# Patient Record
Sex: Female | Born: 1950 | Race: White | Hispanic: No | Marital: Married | Smoking: Never smoker
Health system: Southern US, Community
[De-identification: ages and names within clinical notes are randomized; demographics above are authoritative.]

## PROBLEM LIST (undated history)

## (undated) DIAGNOSIS — G4730 Sleep apnea, unspecified: Secondary | ICD-10-CM

## (undated) DIAGNOSIS — S060XAA Concussion with loss of consciousness status unknown, initial encounter: Secondary | ICD-10-CM

## (undated) DIAGNOSIS — I209 Angina pectoris, unspecified: Secondary | ICD-10-CM

## (undated) DIAGNOSIS — F988 Other specified behavioral and emotional disorders with onset usually occurring in childhood and adolescence: Secondary | ICD-10-CM

## (undated) DIAGNOSIS — M1990 Unspecified osteoarthritis, unspecified site: Secondary | ICD-10-CM

## (undated) DIAGNOSIS — E039 Hypothyroidism, unspecified: Secondary | ICD-10-CM

## (undated) DIAGNOSIS — F419 Anxiety disorder, unspecified: Secondary | ICD-10-CM

## (undated) DIAGNOSIS — F32A Depression, unspecified: Secondary | ICD-10-CM

## (undated) DIAGNOSIS — R42 Dizziness and giddiness: Secondary | ICD-10-CM

## (undated) DIAGNOSIS — K589 Irritable bowel syndrome without diarrhea: Secondary | ICD-10-CM

## (undated) DIAGNOSIS — E119 Type 2 diabetes mellitus without complications: Secondary | ICD-10-CM

## (undated) DIAGNOSIS — K219 Gastro-esophageal reflux disease without esophagitis: Secondary | ICD-10-CM

## (undated) DIAGNOSIS — R2689 Other abnormalities of gait and mobility: Secondary | ICD-10-CM

## (undated) DIAGNOSIS — E785 Hyperlipidemia, unspecified: Secondary | ICD-10-CM

## (undated) DIAGNOSIS — Z973 Presence of spectacles and contact lenses: Secondary | ICD-10-CM

## (undated) DIAGNOSIS — D649 Anemia, unspecified: Secondary | ICD-10-CM

## (undated) DIAGNOSIS — R51 Headache: Secondary | ICD-10-CM

## (undated) DIAGNOSIS — R200 Anesthesia of skin: Secondary | ICD-10-CM

## (undated) DIAGNOSIS — W19XXXA Unspecified fall, initial encounter: Secondary | ICD-10-CM

## (undated) DIAGNOSIS — J189 Pneumonia, unspecified organism: Secondary | ICD-10-CM

## (undated) DIAGNOSIS — R519 Headache, unspecified: Secondary | ICD-10-CM

## (undated) DIAGNOSIS — F329 Major depressive disorder, single episode, unspecified: Secondary | ICD-10-CM

## (undated) DIAGNOSIS — I10 Essential (primary) hypertension: Secondary | ICD-10-CM

## (undated) DIAGNOSIS — S060X9A Concussion with loss of consciousness of unspecified duration, initial encounter: Secondary | ICD-10-CM

## (undated) DIAGNOSIS — Z87440 Personal history of urinary (tract) infections: Secondary | ICD-10-CM

## (undated) HISTORY — PX: CARPAL TUNNEL RELEASE: SHX101

## (undated) HISTORY — PX: UVULOPALATOPHARYNGOPLASTY: SHX827

## (undated) HISTORY — PX: TONSILLECTOMY: SUR1361

## (undated) HISTORY — PX: KNEE ARTHROSCOPY: SUR90

## (undated) HISTORY — PX: TUBAL LIGATION: SHX77

## (undated) HISTORY — PX: BREAST LUMPECTOMY: SHX2

## (undated) HISTORY — PX: EYE SURGERY: SHX253

## (undated) HISTORY — PX: BREAST CYST EXCISION: SHX579

## (undated) HISTORY — PX: DE QUERVAIN'S RELEASE: SHX1439

## (undated) HISTORY — PX: BREAST CYST ASPIRATION: SHX578

## (undated) HISTORY — PX: BREAST SURGERY: SHX581

## (undated) HISTORY — PX: HERNIA REPAIR: SHX51

## (undated) HISTORY — PX: RHINOPLASTY: SUR1284

---

## 1978-02-26 HISTORY — PX: REDUCTION MAMMAPLASTY: SUR839

## 1998-03-02 ENCOUNTER — Ambulatory Visit (HOSPITAL_COMMUNITY): Admission: RE | Admit: 1998-03-02 | Discharge: 1998-03-02 | Payer: Self-pay | Admitting: Obstetrics and Gynecology

## 1998-03-02 ENCOUNTER — Encounter: Payer: Self-pay | Admitting: Obstetrics and Gynecology

## 2005-01-01 ENCOUNTER — Ambulatory Visit: Payer: Self-pay | Admitting: Unknown Physician Specialty

## 2006-01-14 ENCOUNTER — Ambulatory Visit: Payer: Self-pay

## 2006-02-14 ENCOUNTER — Ambulatory Visit: Payer: Self-pay | Admitting: Unknown Physician Specialty

## 2006-06-28 ENCOUNTER — Ambulatory Visit: Payer: Self-pay

## 2007-04-15 ENCOUNTER — Ambulatory Visit: Payer: Self-pay

## 2007-06-12 ENCOUNTER — Ambulatory Visit (HOSPITAL_BASED_OUTPATIENT_CLINIC_OR_DEPARTMENT_OTHER): Admission: RE | Admit: 2007-06-12 | Discharge: 2007-06-12 | Payer: Self-pay | Admitting: Orthopedic Surgery

## 2007-08-04 ENCOUNTER — Ambulatory Visit: Payer: Self-pay

## 2007-08-05 ENCOUNTER — Ambulatory Visit: Payer: Self-pay | Admitting: Unknown Physician Specialty

## 2009-01-03 ENCOUNTER — Ambulatory Visit: Payer: Self-pay | Admitting: Obstetrics and Gynecology

## 2009-01-10 ENCOUNTER — Ambulatory Visit: Payer: Self-pay | Admitting: Obstetrics and Gynecology

## 2010-03-20 ENCOUNTER — Encounter: Payer: Self-pay | Admitting: Internal Medicine

## 2010-07-11 NOTE — Op Note (Signed)
NAME:  Destiny Shaw, Destiny Shaw                  ACCOUNT NO.:  [id number]   MEDICAL RECORD NO.:  [id number]          PATIENT TYPE:  AMB   LOCATION:  [facility]                         FACILITY:  [facility]   PHYSICIAN:  Madlyn Frankel. Charlann Boxer, M.D.  DATE OF BIRTH:  1950-08-30   DATE OF PROCEDURE:  06/12/2007  DATE OF DISCHARGE:                               OPERATIVE REPORT   PREOPERATIVE DIAGNOSIS:  Persistent left knee pain medially with concern  for medial meniscal tear versus osteoarthritic changes.   POSTOPERATIVE DIAGNOSES/FINDINGS:  1. Left knee undersurface medial meniscal tear of posterior horn.  2. Central degenerative changes to the lateral meniscus, midbody      anterior horn.  3. 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  4. Grade 2 to 3 changes of the patellofemoral apex.  5. Synovitis.   OPERATION PERFORMED:  1. Left knee diagnostic and operative arthroscopy with medial      meniscectomy.  2. Partial lateral meniscectomy.  3. Medial and patellofemoral chondroplasty.  4. Synovectomy.   SURGEON:  Madlyn Frankel. Charlann Boxer, M.D.   ASSISTANT:  None.   ANESTHESIA:  Local administered anesthesia plus MAC.   COMPLICATIONS:  None.   DRAINS:  None.   INDICATIONS FOR PROCEDURE:  Ms. Shellenbarger is a 60 year old female who I have  been following for her left knee for some time after she had initial  arthroscopic surgery performed in 2007.  She had persistent problems.  Initial injury was more of a direct blow contusive type injury with  mechanical type symptoms that have persisted.  After extensive  discussion, the plan was to proceed with arthroscopic surgery and to  plan for debridement, evaluation of the knee for potential long term  sequelae addressing these issues from that standpoint.  The risks and  benefits were discussed, particularly including the risks of infection,  deep venous  thrombosis, persistent discomfort postoperatively and  potential need for another surgical procedure down the road.  Consent  was obtained.   DESCRIPTION OF PROCEDURE:  The patient was brought to the operating  theater.  Once adequate anesthesia, preoperative antibiotics  administered, the patient was positioned supine with the left leg in the  leg holder.  The left lower extremity was then prescrubbed and prepped  and draped in sterile fashion.  Standard inferomedial, superomedial,  inferolateral portals were utilized.  Diagnostic evaluation of the knee  revealed the above findings.  There was noted to be fairly extensive  synovitis versus scarring on the medial side of the knee.  Patellofemoral changes were noted to be predominantly at the  patellofemoral apex.  The trochlear surface appeared to be intact.  This  was better defined after synovectomy.  The lateral compartment was  relatively intact both femoral and tibial cartilage surfaces.  The  meniscus had some evidence of some degenerative fraying and tearing into  the central portion, the midbody and lateral meniscus.  This was  debrided back using the  3.5 Cuda shaver and small biting basket.  The  remainder of this meniscus which was approximately 90% was stable and  normal.   Medially which most of the attention was addressed including synovectomy  with a 3.5 Cuda shaver.  Prior examination of the meniscus revealed  abnormal meniscus with tear on the inferior portion of the posterior  horn.  I used the biting basket  in further delineating this.  I used  the biting basket to trim out the inferior leaflet and then used the 35  shaver to remove the fragments as well as contour the remaining  meniscus.  A 35 shaver was also used to debride some of the chondral  flaps hear this grade 4 area on the medial tibial plateau and medial  femoral condyle.   Following all these procedures, I re-examined the knee to make sure  there was  no loose fragments of cartilage.  Once I was satisfied there  were none, the instrumentation was removed.  The portals were  reapproximated using a 4-0 nylon.   Please note that I chose not to perform microfracture technique in this  60 year old female due to kissing lesions.  This was not an isolated  lesion and I did not feel the success of that operation would be worthy  of that.   I will address these findings with the patient in the postoperative  period.   Following the closure of the wounds, the knee was injected with 30 mL of  0.25% Marcaine with epinephrine.  The knee was then dressed into a  sterile, bulky Jones dressing.  She was brought to the recovery room in  stable condition tolerating the procedure well.      Madlyn Frankel Charlann Boxer, M.D.  Electronically Signed     MDO/MEDQ  D:  06/12/2007  T:  06/12/2007  Job:  [id number]

## 2010-09-08 ENCOUNTER — Ambulatory Visit: Payer: Self-pay | Admitting: Otolaryngology

## 2010-10-06 ENCOUNTER — Ambulatory Visit: Payer: Self-pay | Admitting: Otolaryngology

## 2010-11-21 LAB — POCT HEMOGLOBIN-HEMACUE
Hemoglobin: 13.5
Operator id: 268271

## 2011-10-15 ENCOUNTER — Ambulatory Visit: Payer: Self-pay | Admitting: Anesthesiology

## 2011-10-24 ENCOUNTER — Ambulatory Visit: Payer: Self-pay | Admitting: Internal Medicine

## 2011-10-30 ENCOUNTER — Ambulatory Visit: Payer: Self-pay | Admitting: Orthopedic Surgery

## 2013-06-08 ENCOUNTER — Ambulatory Visit: Payer: Self-pay | Admitting: Orthopedic Surgery

## 2013-09-07 ENCOUNTER — Ambulatory Visit: Payer: Self-pay

## 2013-11-09 ENCOUNTER — Emergency Department: Payer: Self-pay | Admitting: Emergency Medicine

## 2014-03-26 ENCOUNTER — Ambulatory Visit: Payer: Self-pay | Admitting: Obstetrics and Gynecology

## 2014-06-15 NOTE — Op Note (Signed)
PATIENT NAME:  Destiny Shaw, Destiny Shaw MR#:  [id number] DATE OF BIRTH:  [birth year]  DATE OF PROCEDURE:  10/30/2011  PREOPERATIVE DIAGNOSIS: Right wrist de Quervain's syndrome.  POSTOPERATIVE DIAGNOSIS: Right wrist de Quervain's syndrome.  PROCEDURE: Right wrist de Quervain's release.   SURGEON: Laurene Footman, M.D.   ANESTHESIA: General.   DESCRIPTION OF PROCEDURE: The patient was brought to the Operating Room and after adequate general anesthesia was obtained a tourniquet was applied to the right upper arm. After prepping and draping in the usual sterile fashion, appropriate patient identification and time out procedures were carried out. The tourniquet was raised to 250 mmHg. An approximately 1.5 cm incision was made over the radial styloid and in a skin crease. The subcutaneous tissue was spread preserving cutaneous nerves and retractors placed. The area of compression was identified and the fascia incised. There was a moderate amount of fluid within the canal. There was a separate compartment for the extensor tendon. Both tendons were released thoroughly and there were no residual bands visible. The thumb was placed through a range of motion. There did not appear to be any further constriction on the tendons; the tendons themselves were intact. The wound was then irrigated and closed with simple interrupted 4-0 nylon. 5 mL of 0.5% Sensorcaine without epinephrine was infiltrated for postoperative analgesia. A sterile dressing of Xeroform, 4 x 4's, Webril, and Ace wrap was applied. The tourniquet was let down. The patient was sent to the recovery room in stable condition.   ESTIMATED BLOOD LOSS: Minimal.   COMPLICATIONS: None.        SPECIMEN: None.   TOURNIQUET TIME: 11 minutes at 250 mmHg. ____________________________ Laurene Footman, MD mjm:slb D: 10/30/2011 13:59:39 ET T: 10/30/2011 14:14:13 ET JOB#: [id number]  cc: Laurene Footman, MD, <Dictator> Laurene Footman MD ELECTRONICALLY SIGNED  10/31/2011 8:00

## 2014-08-04 NOTE — Progress Notes (Signed)
Notified pt of results 

## 2015-04-04 ENCOUNTER — Other Ambulatory Visit: Payer: Self-pay | Admitting: Orthopedic Surgery

## 2015-04-04 DIAGNOSIS — M5126 Other intervertebral disc displacement, lumbar region: Secondary | ICD-10-CM

## 2015-04-25 ENCOUNTER — Ambulatory Visit
Admission: RE | Admit: 2015-04-25 | Discharge: 2015-04-25 | Disposition: A | Discharge status: Home / Self Care | Payer: Medicare Other | Source: Ambulatory Visit | Attending: Orthopedic Surgery | Admitting: Orthopedic Surgery

## 2015-04-25 DIAGNOSIS — M4690 Unspecified inflammatory spondylopathy, site unspecified: Secondary | ICD-10-CM | POA: Insufficient documentation

## 2015-04-25 DIAGNOSIS — M5127 Other intervertebral disc displacement, lumbosacral region: Secondary | ICD-10-CM | POA: Diagnosis not present

## 2015-04-25 DIAGNOSIS — M5126 Other intervertebral disc displacement, lumbar region: Secondary | ICD-10-CM | POA: Diagnosis not present

## 2015-04-25 DIAGNOSIS — M4806 Spinal stenosis, lumbar region: Secondary | ICD-10-CM | POA: Insufficient documentation

## 2015-05-10 ENCOUNTER — Encounter: Payer: Self-pay | Admitting: *Deleted

## 2015-05-11 ENCOUNTER — Encounter: Payer: Self-pay | Admitting: *Deleted

## 2015-05-11 ENCOUNTER — Encounter
Admission: RE | Disposition: A | Discharge status: Home / Self Care | Payer: Self-pay | Source: Ambulatory Visit | Attending: Unknown Physician Specialty

## 2015-05-11 ENCOUNTER — Ambulatory Visit: Payer: Medicare Other | Admitting: Anesthesiology

## 2015-05-11 ENCOUNTER — Ambulatory Visit
Admission: RE | Admit: 2015-05-11 | Discharge: 2015-05-11 | Disposition: A | Discharge status: Home / Self Care | Payer: Medicare Other | Source: Ambulatory Visit | Attending: Unknown Physician Specialty | Admitting: Unknown Physician Specialty

## 2015-05-11 DIAGNOSIS — K5900 Constipation, unspecified: Secondary | ICD-10-CM | POA: Diagnosis not present

## 2015-05-11 DIAGNOSIS — F988 Other specified behavioral and emotional disorders with onset usually occurring in childhood and adolescence: Secondary | ICD-10-CM | POA: Insufficient documentation

## 2015-05-11 DIAGNOSIS — Z7984 Long term (current) use of oral hypoglycemic drugs: Secondary | ICD-10-CM | POA: Insufficient documentation

## 2015-05-11 DIAGNOSIS — G4730 Sleep apnea, unspecified: Secondary | ICD-10-CM | POA: Diagnosis not present

## 2015-05-11 DIAGNOSIS — R195 Other fecal abnormalities: Secondary | ICD-10-CM | POA: Insufficient documentation

## 2015-05-11 DIAGNOSIS — D123 Benign neoplasm of transverse colon: Secondary | ICD-10-CM | POA: Insufficient documentation

## 2015-05-11 DIAGNOSIS — E785 Hyperlipidemia, unspecified: Secondary | ICD-10-CM | POA: Diagnosis not present

## 2015-05-11 DIAGNOSIS — K589 Irritable bowel syndrome without diarrhea: Secondary | ICD-10-CM | POA: Insufficient documentation

## 2015-05-11 DIAGNOSIS — E119 Type 2 diabetes mellitus without complications: Secondary | ICD-10-CM | POA: Insufficient documentation

## 2015-05-11 DIAGNOSIS — K319 Disease of stomach and duodenum, unspecified: Secondary | ICD-10-CM | POA: Insufficient documentation

## 2015-05-11 DIAGNOSIS — Z79899 Other long term (current) drug therapy: Secondary | ICD-10-CM | POA: Insufficient documentation

## 2015-05-11 DIAGNOSIS — D122 Benign neoplasm of ascending colon: Secondary | ICD-10-CM | POA: Diagnosis not present

## 2015-05-11 DIAGNOSIS — F419 Anxiety disorder, unspecified: Secondary | ICD-10-CM | POA: Insufficient documentation

## 2015-05-11 DIAGNOSIS — K644 Residual hemorrhoidal skin tags: Secondary | ICD-10-CM | POA: Insufficient documentation

## 2015-05-11 DIAGNOSIS — K210 Gastro-esophageal reflux disease with esophagitis: Secondary | ICD-10-CM | POA: Diagnosis not present

## 2015-05-11 DIAGNOSIS — K640 First degree hemorrhoids: Secondary | ICD-10-CM | POA: Insufficient documentation

## 2015-05-11 DIAGNOSIS — F329 Major depressive disorder, single episode, unspecified: Secondary | ICD-10-CM | POA: Diagnosis not present

## 2015-05-11 DIAGNOSIS — E039 Hypothyroidism, unspecified: Secondary | ICD-10-CM | POA: Diagnosis not present

## 2015-05-11 DIAGNOSIS — I10 Essential (primary) hypertension: Secondary | ICD-10-CM | POA: Diagnosis not present

## 2015-05-11 HISTORY — DX: Type 2 diabetes mellitus without complications: E11.9

## 2015-05-11 HISTORY — DX: Major depressive disorder, single episode, unspecified: F32.9

## 2015-05-11 HISTORY — DX: Gastro-esophageal reflux disease without esophagitis: K21.9

## 2015-05-11 HISTORY — DX: Depression, unspecified: F32.A

## 2015-05-11 HISTORY — DX: Anxiety disorder, unspecified: F41.9

## 2015-05-11 HISTORY — DX: Essential (primary) hypertension: I10

## 2015-05-11 HISTORY — PX: COLONOSCOPY WITH PROPOFOL: SHX5780

## 2015-05-11 HISTORY — DX: Hypothyroidism, unspecified: E03.9

## 2015-05-11 HISTORY — DX: Anemia, unspecified: D64.9

## 2015-05-11 HISTORY — DX: Hyperlipidemia, unspecified: E78.5

## 2015-05-11 HISTORY — DX: Other specified behavioral and emotional disorders with onset usually occurring in childhood and adolescence: F98.8

## 2015-05-11 HISTORY — DX: Concussion with loss of consciousness status unknown, initial encounter: S06.0XAA

## 2015-05-11 HISTORY — DX: Sleep apnea, unspecified: G47.30

## 2015-05-11 HISTORY — DX: Irritable bowel syndrome, unspecified: K58.9

## 2015-05-11 HISTORY — DX: Concussion with loss of consciousness of unspecified duration, initial encounter: S06.0X9A

## 2015-05-11 HISTORY — PX: ESOPHAGOGASTRODUODENOSCOPY (EGD) WITH PROPOFOL: SHX5813

## 2015-05-11 LAB — GLUCOSE, CAPILLARY: Glucose-Capillary: 152 mg/dL — ABNORMAL HIGH (ref 65–99)

## 2015-05-11 SURGERY — COLONOSCOPY WITH PROPOFOL
Anesthesia: General

## 2015-05-11 MED ORDER — MIDAZOLAM HCL 2 MG/2ML IJ SOLN
INTRAMUSCULAR | Status: DC | PRN
  Administered 2015-05-11: 1 mg via INTRAVENOUS

## 2015-05-11 MED ORDER — SODIUM CHLORIDE 0.9 % IV SOLN
INTRAVENOUS | Status: DC
  Administered 2015-05-11: 1000 mL via INTRAVENOUS

## 2015-05-11 MED ORDER — PROPOFOL 500 MG/50ML IV EMUL
INTRAVENOUS | Status: DC | PRN
  Administered 2015-05-11: 120 ug/kg/min via INTRAVENOUS

## 2015-05-11 MED ORDER — FENTANYL CITRATE (PF) 100 MCG/2ML IJ SOLN
INTRAMUSCULAR | Status: DC | PRN
  Administered 2015-05-11: 50 ug via INTRAVENOUS

## 2015-05-11 MED ORDER — PROPOFOL 10 MG/ML IV BOLUS
INTRAVENOUS | Status: DC | PRN
  Administered 2015-05-11: 90 mg via INTRAVENOUS

## 2015-05-11 NOTE — Transfer of Care (Signed)
Immediate Anesthesia Transfer of Care Note  Patient: Destiny Shaw  Procedure(s) Performed: Procedure(s): COLONOSCOPY WITH PROPOFOL (N/A) ESOPHAGOGASTRODUODENOSCOPY (EGD) WITH PROPOFOL (N/A)  Patient Location: PACU and Endoscopy Unit  Anesthesia Type:General  Level of Consciousness: lethargic and responds to stimulation  Airway & Oxygen Therapy: Patient Spontanous Breathing and Patient connected to nasal cannula oxygen  Post-op Assessment: Report given to RN and Post -op Vital signs reviewed and stable  Post vital signs: Reviewed and stable  Last Vitals:  Filed Vitals:   05/11/15 0721 05/11/15 0832  BP: 156/66   Pulse: 67   Temp: 35.9 C 36.2 C  Resp: 18 16    Complications: No apparent anesthesia complications

## 2015-05-11 NOTE — Anesthesia Postprocedure Evaluation (Signed)
Anesthesia Post Note  Patient: Destiny Shaw  Procedure(s) Performed: Procedure(s) (LRB): COLONOSCOPY WITH PROPOFOL (N/A) ESOPHAGOGASTRODUODENOSCOPY (EGD) WITH PROPOFOL (N/A)  Patient location during evaluation: PACU Anesthesia Type: General Level of consciousness: awake and alert and oriented Pain management: pain level controlled Vital Signs Assessment: post-procedure vital signs reviewed and stable Respiratory status: spontaneous breathing Cardiovascular status: blood pressure returned to baseline Anesthetic complications: no    Last Vitals:  Filed Vitals:   05/11/15 0853 05/11/15 0903  BP: 144/75 138/85  Pulse: 60 56  Temp:    Resp: 22 17    Last Pain: There were no vitals filed for this visit.               Larrie Fraizer

## 2015-05-11 NOTE — Op Note (Signed)
[facility] Gastroenterology Patient Name: Destiny Shaw Procedure Date: 05/11/2015 7:43 AM MRN: [id number] Account #: [id number] Date of Birth: 04-20-1950 Admit Type: Outpatient Age: 65 Room: [facility] ENDO ROOM 4 Gender: Female Note Status: Finalized Procedure:            Colonoscopy Indications:          Heme positive stool Providers:            Manya Silvas, MD Referring MD:         Ocie Cornfield. Ouida Sills, MD (Referring MD) Medicines:            Propofol per Anesthesia Complications:        No immediate complications. Procedure:            Pre-Anesthesia Assessment:                       - After reviewing the risks and benefits, the patient                        was deemed in satisfactory condition to undergo the                        procedure.                       After obtaining informed consent, the colonoscope was                        passed under direct vision. Throughout the procedure,                        the patient's blood pressure, pulse, and oxygen                        saturations were monitored continuously. The                        Colonoscope was introduced through the anus and                        advanced to the the cecum, identified by appendiceal                        orifice and ileocecal valve. The colonoscopy was                        performed without difficulty. The patient tolerated the                        procedure well. The quality of the bowel preparation                        was good. Findings:      A diminutive polyp was found in the transverse colon. The polyp was       sessile. The polyp was removed with a jumbo cold forceps. Resection and       retrieval were complete.      A diminutive polyp was found in the ascending colon. The polyp was       sessile. The polyp was removed with a jumbo cold forceps. Resection and  retrieval were complete.      An area of mildly congested mucosa was found in the cecum.  This was       biopsied with a cold jumbo forceps for histology.      External and internal hemorrhoids were found during endoscopy. The       hemorrhoids were small and Grade I (internal hemorrhoids that do not       prolapse).      The exam was otherwise without abnormality. Impression:           - One diminutive polyp in the transverse colon, removed                        with a jumbo cold forceps. Resected and retrieved.                       - One diminutive polyp in the ascending colon, removed                        with a jumbo cold forceps. Resected and retrieved.                       - Congested mucosa in the cecum. Biopsied.                       - External and internal hemorrhoids.                       - The examination was otherwise normal. Recommendation:       - Await pathology results. Manya Silvas, MD 05/11/2015 8:32:49 AM This report has been signed electronically. Number of Addenda: 0 Note Initiated On: 05/11/2015 7:43 AM Scope Withdrawal Time: 0 hours 11 minutes 37 seconds  Total Procedure Duration: 0 hours 23 minutes 12 seconds       [facility]

## 2015-05-11 NOTE — Anesthesia Preprocedure Evaluation (Addendum)
Anesthesia Evaluation  Patient identified by MRN, date of birth, ID band Patient awake    Reviewed: Allergy & Precautions, NPO status , Patient's Chart, lab work & pertinent test results  Airway Mallampati: III  TM Distance: <3 FB Neck ROM: Full    Dental  (+) Chipped, Caps   Pulmonary sleep apnea ,    Pulmonary exam normal breath sounds clear to auscultation       Cardiovascular hypertension, negative cardio ROS Normal cardiovascular exam     Neuro/Psych Anxiety Depression    GI/Hepatic GERD  Medicated and Controlled,  Endo/Other  diabetes, Well Controlled, Type 2, Oral Hypoglycemic AgentsHypothyroidism   Renal/GU   negative genitourinary   Musculoskeletal   Abdominal Normal abdominal exam  (+)   Peds negative pediatric ROS (+)  Hematology  (+) anemia ,   Anesthesia Other Findings   Reproductive/Obstetrics                            Anesthesia Physical Anesthesia Plan  ASA: III  Anesthesia Plan: General   Post-op Pain Management:    Induction: Intravenous  Airway Management Planned: Nasal Cannula  Additional Equipment:   Intra-op Plan:   Post-operative Plan:   Informed Consent: I have reviewed the patients History and Physical, chart, labs and discussed the procedure including the risks, benefits and alternatives for the proposed anesthesia with the patient or authorized representative who has indicated his/her understanding and acceptance.   Dental advisory given  Plan Discussed with: CRNA and Surgeon  Anesthesia Plan Comments:         Anesthesia Quick Evaluation

## 2015-05-11 NOTE — Op Note (Signed)
[facility] Gastroenterology Patient Name: Destiny Shaw Procedure Date: 05/11/2015 7:44 AM MRN: [id number] Account #: [id number] Date of Birth: 23-May-1950 Admit Type: Outpatient Age: 65 Room: [facility] ENDO ROOM 4 Gender: Female Note Status: Finalized Procedure:            Upper GI endoscopy Indications:          Heme positive stool Providers:            Manya Silvas, MD Referring MD:         Ocie Cornfield. Ouida Sills, MD (Referring MD) Medicines:            Propofol per Anesthesia Complications:        No immediate complications. Procedure:            Pre-Anesthesia Assessment:                       - After reviewing the risks and benefits, the patient                        was deemed in satisfactory condition to undergo the                        procedure.                       After obtaining informed consent, the endoscope was                        passed under direct vision. Throughout the procedure,                        the patient's blood pressure, pulse, and oxygen                        saturations were monitored continuously. The Endoscope                        was introduced through the mouth, and advanced to the                        second part of duodenum. The upper GI endoscopy was                        accomplished without difficulty. The patient tolerated                        the procedure well. Findings:      LA Grade B (one or more mucosal breaks greater than 5 mm, not extending       between the tops of two mucosal folds) esophagitis with no bleeding was       found 39 cm from the incisors. This was the esoph cardia junction.       Biopsies were taken with a cold forceps for histology.      Patchy mild inflammation characterized by erosions, erythema and       granularity was found in the gastric antrum. Biopsies were taken with a       cold forceps for histology. Biopsies were taken with a cold forceps for       Helicobacter pylori  testing.      Patchy mild inflammation characterized  by erythema and granularity was       found in the duodenal bulb. Biopsies were taken with a cold forceps for       histology. Biopsies for histology were taken with a cold forceps for       evaluation of possible celiac disease.      Diffuse mild mucosal changes characterized by flattening were found in       the second portion of the duodenum. Biopsies were taken with a cold       forceps for histology. Biopsies for histology were taken with a cold       forceps for evaluation of possible celiac disease. Impression:           - LA Grade B reflux and acute esophagitis. Biopsied.                       - Gastritis. Biopsied.                       - Duodenitis. Biopsied.                       - Mucosal changes in the duodenum. Biopsied. Recommendation:       - Await pathology results. Procedure Code(s):    --- Professional ---                       [id number], Esophagogastroduodenoscopy, flexible, transoral;                        with biopsy, single or multiple Diagnosis Code(s):    --- Professional ---                       K21.0, Gastro-esophageal reflux disease with esophagitis                       K29.70, Gastritis, unspecified, without bleeding                       K29.80, Duodenitis without bleeding                       K31.89, Other diseases of stomach and duodenum                       R19.5, Other fecal abnormalities CPT copyright 2016 American Medical Association. All rights reserved. The codes documented in this report are preliminary and upon coder review may  be revised to meet current compliance requirements. Manya Silvas, MD 05/11/2015 8:02:34 AM This report has been signed electronically. Number of Addenda: 0 Note Initiated On: 05/11/2015 7:44 AM      [facility]

## 2015-05-11 NOTE — H&P (Signed)
Primary Care Physician:  Kirk Ruths., MD Primary Gastroenterologist:  Dr. Vira Agar  Pre-Procedure History & Physical: HPI:  Destiny Shaw is a 65 y.o. female is here for an endoscopy and colonoscopy.   Past Medical History  Diagnosis Date  . Diabetes mellitus without complication ([place])   . Anemia   . GERD (gastroesophageal reflux disease)   . Hyperlipidemia   . IBS (irritable bowel syndrome)   . Sleep apnea   . Concussion   . ADD (attention deficit disorder)   . Hypertension   . Hypothyroidism   . Depression   . Anxiety     Past Surgical History  Procedure Laterality Date  . Breast surgery    . Carpal tunnel release    . Cesarean section    . Hernia repair    . Knee arthroscopy    . Rhinoplasty    . Tonsillectomy    . Tubal ligation    . De quervain's release Right   . Breast lumpectomy Right   . Uvulopalatopharyngoplasty      Prior to Admission medications   Medication Sig Start Date End Date Taking? Authorizing Provider  chlorhexidine (PERIDEX) 0.12 % solution Use as directed 15 mLs in the mouth or throat daily.   Yes Historical Provider, MD  cyclobenzaprine (FLEXERIL) 5 MG tablet Take 5 mg by mouth 3 (three) times daily as needed for muscle spasms.   Yes Historical Provider, MD  FLUoxetine (PROZAC) 40 MG capsule Take 40 mg by mouth daily.   Yes Historical Provider, MD  losartan (COZAAR) 50 MG tablet Take 50 mg by mouth daily.   Yes Historical Provider, MD  metFORMIN (GLUCOPHAGE) 500 MG tablet Take 500 mg by mouth 2 (two) times daily with a meal.   Yes Historical Provider, MD  Multiple Vitamin (MULTIVITAMIN) tablet Take 1 tablet by mouth daily.   Yes Historical Provider, MD  promethazine (PHENERGAN) 25 MG tablet Take 25 mg by mouth 3 (three) times daily as needed for nausea or vomiting (1/2-1 tab tid prn).   Yes Historical Provider, MD  propranolol (INDERAL) 20 MG tablet Take 20 mg by mouth 2 (two) times daily.   Yes Historical Provider, MD  topiramate  (TOPAMAX) 25 MG tablet Take 25 mg by mouth 2 (two) times daily.   Yes Historical Provider, MD  traMADol (ULTRAM) 50 MG tablet Take 100 mg by mouth every 6 (six) hours as needed.   Yes Historical Provider, MD  Vitamin D, Ergocalciferol, (DRISDOL) 50000 units CAPS capsule Take 50,000 Units by mouth every 7 (seven) days.   Yes Historical Provider, MD  zolpidem (AMBIEN) 10 MG tablet Take 10 mg by mouth at bedtime as needed for sleep.   Yes Historical Provider, MD    Allergies as of 04/22/2015  . (Not on File)    History reviewed. No pertinent family history.  Social History   Social History  . Marital Status: Married    Spouse Name: N/A  . Number of Children: N/A  . Years of Education: N/A   Occupational History  . Not on file.   Social History Main Topics  . Smoking status: Never Smoker   . Smokeless tobacco: Never Used  . Alcohol Use: No  . Drug Use: No  . Sexual Activity: Not on file   Other Topics Concern  . Not on file   Social History Narrative    Review of Systems: See HPI, otherwise negative ROS  Physical Exam: BP 156/66 mmHg  Pulse 67  Temp(Src) 96.6 F (35.9 C) (Tympanic)  Resp 18  Ht 5\' 4"  (1.626 m)  Wt 85.276 kg (188 lb)  BMI 32.25 kg/m2  SpO2 100% General:   Alert,  pleasant and cooperative in NAD Head:  Normocephalic and atraumatic. Neck:  Supple; no masses or thyromegaly. Lungs:  Clear throughout to auscultation.    Heart:  Regular rate and rhythm. Abdomen:  Soft, nontender and nondistended. Normal bowel sounds, without guarding, and without rebound.   Neurologic:  Alert and  oriented x4;  grossly normal neurologically.  Impression/Plan: Destiny Shaw is here for an endoscopy and colonoscopy to be performed for heme positive stool and constipation  Risks, benefits, limitations, and alternatives regarding  endoscopy and colonoscopy have been reviewed with the patient.  Questions have been answered.  All parties agreeable.   Gaylyn Cheers, MD   05/11/2015, 7:33 AM

## 2015-05-12 LAB — SURGICAL PATHOLOGY

## 2015-06-27 ENCOUNTER — Other Ambulatory Visit: Payer: Self-pay | Admitting: Nurse Practitioner

## 2015-06-27 DIAGNOSIS — R1084 Generalized abdominal pain: Secondary | ICD-10-CM

## 2015-07-04 ENCOUNTER — Ambulatory Visit: Payer: Medicare Other

## 2015-07-06 ENCOUNTER — Ambulatory Visit
Admission: RE | Admit: 2015-07-06 | Discharge: 2015-07-06 | Disposition: A | Discharge status: Home / Self Care | Payer: Medicare Other | Source: Ambulatory Visit | Attending: Nurse Practitioner | Admitting: Nurse Practitioner

## 2015-07-06 DIAGNOSIS — R1084 Generalized abdominal pain: Secondary | ICD-10-CM | POA: Diagnosis not present

## 2015-07-06 DIAGNOSIS — G8929 Other chronic pain: Secondary | ICD-10-CM | POA: Insufficient documentation

## 2015-07-06 DIAGNOSIS — K760 Fatty (change of) liver, not elsewhere classified: Secondary | ICD-10-CM | POA: Insufficient documentation

## 2015-07-06 MED ORDER — IOPAMIDOL (ISOVUE-300) INJECTION 61%
100.0000 mL | Freq: Once | INTRAVENOUS | Status: AC | PRN
  Administered 2015-07-06: 100 mL via INTRAVENOUS

## 2015-08-22 ENCOUNTER — Ambulatory Visit: Payer: Self-pay | Admitting: Orthopedic Surgery

## 2015-09-01 ENCOUNTER — Encounter: Payer: Self-pay | Admitting: Dietician

## 2015-09-01 ENCOUNTER — Encounter: Payer: Medicare Other | Attending: Nurse Practitioner | Admitting: Dietician

## 2015-09-01 VITALS — Ht 64.0 in | Wt 184.0 lb

## 2015-09-01 DIAGNOSIS — K760 Fatty (change of) liver, not elsewhere classified: Secondary | ICD-10-CM | POA: Diagnosis not present

## 2015-09-01 DIAGNOSIS — E119 Type 2 diabetes mellitus without complications: Secondary | ICD-10-CM | POA: Insufficient documentation

## 2015-09-01 DIAGNOSIS — E669 Obesity, unspecified: Secondary | ICD-10-CM

## 2015-09-01 NOTE — Patient Instructions (Signed)
Balance meals with protein, 2- 3 servings of carbohydrate and "free vegetables". Spread 10 servings of carbohydrate over 3 meals and 2-3 snacks. Fiber goal: 20 gms per day. Increase fluid intake to at least 64 oz. per day. Saturated fat- no more than 10 gms per day. Make lower fat choices when eating out. Switch to English muffin or ww bread rather than a biscuit. Try grilled chicken at in place of fried chicken.

## 2015-09-01 NOTE — Progress Notes (Signed)
Medical Nutrition Therapy: Visit start time: 1330  end time: [id number] Assessment:  Diagnosis: obesity, fatty liver, Type 2 diabetes Medical history: sleep apnea, hyperlipidemia, constipation, GERD Psychosocial issues/ stress concerns: Patient scored high on depression scale and stated she has benefited from counseling in the past and felt she would benefit from meeting with [facility] counselor. She stated she has no thoughts of hurting herself. Preferred learning method:  . Visual  Current weight: 184 lbs  Height: 64 in Medications, supplements: see list Progress and evaluation:  Patient accompanied by her husband in for initial medical nutrition therapy appointment. She reports a weight range of 180-185 lbs in recent years. She reports fasting blood sugars in 180's-220's range and higher during the day. Due to back pain, she has limited mobility. She also reports chronic constipation and reports the Senna she is taking is helping. Most of her breakfast meals are eaten "out" as well as many of her evening meals, often making high fat choices and making portion control more difficult.  She loves milk and has recently switched from whole milk to 2% and drinks up to 1 quart per day. Overall diet is low in vegetables, fiber and fluid. She loves fruit and includes daily. Drinks water, milk and some regular soda or tea.   Physical activity: none  Dietary Intake:  Usual eating pattern includes 3 meals and 2-3 snacks per day. Dining out frequency: 8-12 meals per week.  Breakfast: 9:00am- pancakes "loaded" with butter and syrup or sausage biscuit, coffee with cream or grits, coffee Snack: fruit Lunch: 1-2:00pm- chicken or ham sandwich, water or regular soda. Supper: sub meal or pizza or [place] meal "out" or spaghetti or fried chicken, potatoes or chicken biscuit, potato salad, slaw Snack: fruit or sweets Beverages: water, milk, regular soda or tea  Nutrition Care Education: Weight control/Diabetes:  Instructed on a meal plan based on 1400-1500 calories including carbohydrate counting and the need to balance carbohydrate, protein, small amount of fat and non-starchy vegetables. Gave and reviewed sample menus and discussed healthier options when dining out. Also, stressed need for adequate fiber emphasizing the need for adequate fluid especially if increasing fiber in the diet. Acknowledged difficulty that back pain creates with limited mobility making mindful eating more important for glucose and weight control. Fatty Liver: Instructed on dietary guidelines for fatty liver.Discussed benefit of weight loss as well as recommendations to limit fat, especially saturated fat. Discussed lower fat options.  Nutritional Diagnosis:  [place]-3.3 Overweight/obesity As related to history of frequent dining out and high fat choices as well as large portions and  inability to exercise presently.  As evidenced by diet history..  Intervention:  Balance meals with protein, 2- 3 servings of carbohydrate and "free vegetables". Spread 10 servings of carbohydrate over 3 meals and 2-3 snacks. Fiber goal: 20 gms per day. Increase fluid intake to at least 64 oz. per day. Saturated fat- no more than 10 gms per day. Make lower fat choices when eating out. Ex.'s Switch to English muffin or ww bread rather than a biscuit. Try grilled chicken at in place of fried chicken  Education Materials given:  . Food lists/ Planning A Balanced Meal . Sample meal pattern/ menus . Goals/ instructions Learner/ who was taught:  . Patient  . Spouse/ partner Level of understanding: . Partial understanding; needs review/ practice Learning barriers: . None Willingness to learn/ readiness for change: . Acceptance, ready for change Monitoring and Evaluation:  Dietary intake, exercise, , and body weight  follow up: 11/04/2015 at 1:15pm

## 2015-09-05 ENCOUNTER — Encounter (HOSPITAL_COMMUNITY): Payer: Self-pay

## 2015-09-05 ENCOUNTER — Ambulatory Visit (HOSPITAL_COMMUNITY)
Admission: RE | Admit: 2015-09-05 | Discharge: 2015-09-05 | Disposition: A | Discharge status: Home / Self Care | Payer: Medicare Other | Source: Ambulatory Visit | Attending: Orthopedic Surgery | Admitting: Orthopedic Surgery

## 2015-09-05 ENCOUNTER — Encounter (HOSPITAL_COMMUNITY)
Admission: RE | Admit: 2015-09-05 | Discharge: 2015-09-05 | Disposition: A | Discharge status: Home / Self Care | Payer: Medicare Other | Source: Ambulatory Visit | Attending: Specialist | Admitting: Specialist

## 2015-09-05 DIAGNOSIS — E119 Type 2 diabetes mellitus without complications: Secondary | ICD-10-CM | POA: Insufficient documentation

## 2015-09-05 DIAGNOSIS — M48061 Spinal stenosis, lumbar region without neurogenic claudication: Secondary | ICD-10-CM

## 2015-09-05 DIAGNOSIS — M4806 Spinal stenosis, lumbar region: Secondary | ICD-10-CM | POA: Insufficient documentation

## 2015-09-05 HISTORY — DX: Dizziness and giddiness: R42

## 2015-09-05 HISTORY — DX: Unspecified osteoarthritis, unspecified site: M19.90

## 2015-09-05 HISTORY — DX: Other abnormalities of gait and mobility: R26.89

## 2015-09-05 HISTORY — DX: Pneumonia, unspecified organism: J18.9

## 2015-09-05 HISTORY — DX: Angina pectoris, unspecified: I20.9

## 2015-09-05 HISTORY — DX: Unspecified fall, initial encounter: W19.XXXA

## 2015-09-05 HISTORY — DX: Headache: R51

## 2015-09-05 HISTORY — DX: Headache, unspecified: R51.9

## 2015-09-05 HISTORY — DX: Anesthesia of skin: R20.0

## 2015-09-05 HISTORY — DX: Presence of spectacles and contact lenses: Z97.3

## 2015-09-05 HISTORY — DX: Personal history of urinary (tract) infections: Z87.440

## 2015-09-05 LAB — CBC
HEMATOCRIT: 38.1 % (ref 36.0–46.0)
Hemoglobin: 12.3 g/dL (ref 12.0–15.0)
MCH: 26.7 pg (ref 26.0–34.0)
MCHC: 32.3 g/dL (ref 30.0–36.0)
MCV: 82.8 fL (ref 78.0–100.0)
Platelets: 235 10*3/uL (ref 150–400)
RBC: 4.6 MIL/uL (ref 3.87–5.11)
RDW: 14.2 % (ref 11.5–15.5)
WBC: 6.3 10*3/uL (ref 4.0–10.5)

## 2015-09-05 LAB — BASIC METABOLIC PANEL
Anion gap: 7 (ref 5–15)
BUN: 10 mg/dL (ref 6–20)
CHLORIDE: 109 mmol/L (ref 101–111)
CO2: 25 mmol/L (ref 22–32)
Calcium: 9.5 mg/dL (ref 8.9–10.3)
Creatinine, Ser: 0.55 mg/dL (ref 0.44–1.00)
GFR calc Af Amer: >60 mL/min (ref >60)
GFR calc non Af Amer: >60 mL/min (ref >60)
GLUCOSE: 156 mg/dL — AB (ref 65–99)
POTASSIUM: 4.4 mmol/L (ref 3.5–5.1)
Sodium: 141 mmol/L (ref 135–145)

## 2015-09-05 LAB — ABO/RH: ABO/RH(D): O POS

## 2015-09-05 NOTE — Progress Notes (Signed)
Echo Report per chart 05/02/2015  EKG per chart 04/22/2015

## 2015-09-05 NOTE — Patient Instructions (Signed)
NASRA SEDENO  09/05/2015   Your procedure is scheduled on: Wednesday September 14, 2015  Report to [facility]  Entrance take [place]  elevators to 3rd floor to  [place] at 6:30 AM.  Call this number if you have problems the morning of surgery [phone]   Remember: ONLY 1 PERSON MAY GO WITH YOU TO SHORT STAY TO GET  READY MORNING OF [place].  Do not eat food or drink liquids :After Midnight.     Take these medicines the morning of surgery with A SIP OF WATER: Fluoxetine (Prozac); Propranolol (Inderal);  DO NOT TAKE ANY DIABETIC MEDICATIONS DAY OF YOUR SURGERY                               You may not have any metal on your body including hair pins and              piercings  Do not wear jewelry, make-up, lotions, powders or perfumes, deodorant             Do not wear nail polish.  Do not shave  48 hours prior to surgery.                Do not bring valuables to the hospital. [place].  Contacts, dentures or bridgework may not be worn into surgery.  Leave suitcase in the car. After surgery it may be brought to your room.                Please read over the following fact sheets you were given:INCENTIVE SPIROMETER  _____________________________________________________________________             [facility] - Preparing for Surgery Before surgery, you can play an important role.  Because skin is not sterile, your skin needs to be as free of germs as possible.  You can reduce the number of germs on your skin by washing with CHG (chlorahexidine gluconate) soap before surgery.  CHG is an antiseptic cleaner which kills germs and bonds with the skin to continue killing germs even after washing. Please DO NOT use if you have an allergy to CHG or antibacterial soaps.  If your skin becomes reddened/irritated stop using the CHG and inform your nurse when you arrive at Short Stay. Do not shave (including  legs and underarms) for at least 48 hours prior to the first CHG shower.  You may shave your face/neck. Please follow these instructions carefully:  1.  Shower with CHG Soap the night before surgery and the  morning of Surgery.  2.  If you choose to wash your hair, wash your hair first as usual with your  normal  shampoo.  3.  After you shampoo, rinse your hair and body thoroughly to remove the  shampoo.                           4.  Use CHG as you would any other liquid soap.  You can apply chg directly  to the skin and wash                       Gently with a scrungie  or clean washcloth.  5.  Apply the CHG Soap to your body ONLY FROM THE NECK DOWN.   Do not use on face/ open                           Wound or open sores. Avoid contact with eyes, ears mouth and genitals (private parts).                       Wash face,  Genitals (private parts) with your normal soap.             6.  Wash thoroughly, paying special attention to the area where your surgery  will be performed.  7.  Thoroughly rinse your body with warm water from the neck down.  8.  DO NOT shower/wash with your normal soap after using and rinsing off  the CHG Soap.                9.  Pat yourself dry with a clean towel.            10.  Wear clean pajamas.            11.  Place clean sheets on your bed the night of your first shower and do not  sleep with pets. Day of Surgery : Do not apply any lotions/deodorants the morning of surgery.  Please wear clean clothes to the hospital/surgery center.  FAILURE TO FOLLOW THESE INSTRUCTIONS MAY RESULT IN THE CANCELLATION OF YOUR SURGERY PATIENT SIGNATURE_________________________________  NURSE SIGNATURE__________________________________  ________________________________________________________________________   Adam Phenix  An incentive spirometer is a tool that can help keep your lungs clear and active. This tool measures how well you are filling your lungs with each breath.  Taking long deep breaths may help reverse or decrease the chance of developing breathing (pulmonary) problems (especially infection) following:  A long period of time when you are unable to move or be active. BEFORE THE PROCEDURE   If the spirometer includes an indicator to show your best effort, your nurse or respiratory therapist will set it to a desired goal.  If possible, sit up straight or lean slightly forward. Try not to slouch.  Hold the incentive spirometer in an upright position. INSTRUCTIONS FOR USE  1. Sit on the edge of your bed if possible, or sit up as far as you can in bed or on a chair. 2. Hold the incentive spirometer in an upright position. 3. Breathe out normally. 4. Place the mouthpiece in your mouth and seal your lips tightly around it. 5. Breathe in slowly and as deeply as possible, raising the piston or the ball toward the top of the column. 6. Hold your breath for 3-5 seconds or for as long as possible. Allow the piston or ball to fall to the bottom of the column. 7. Remove the mouthpiece from your mouth and breathe out normally. 8. Rest for a few seconds and repeat Steps 1 through 7 at least 10 times every 1-2 hours when you are awake. Take your time and take a few normal breaths between deep breaths. 9. The spirometer may include an indicator to show your best effort. Use the indicator as a goal to work toward during each repetition. 10. After each set of 10 deep breaths, practice coughing to be sure your lungs are clear. If you have an incision (the cut made at the time of surgery), support your incision when  coughing by placing a pillow or rolled up towels firmly against it. Once you are able to get out of bed, walk around indoors and cough well. You may stop using the incentive spirometer when instructed by your caregiver.  RISKS AND COMPLICATIONS  Take your time so you do not get dizzy or light-headed.  If you are in pain, you may need to take or ask for pain  medication before doing incentive spirometry. It is harder to take a deep breath if you are having pain. AFTER USE  Rest and breathe slowly and easily.  It can be helpful to keep track of a log of your progress. Your caregiver can provide you with a simple table to help with this. If you are using the spirometer at home, follow these instructions: [place] IF:   You are having difficultly using the spirometer.  You have trouble using the spirometer as often as instructed.  Your pain medication is not giving enough relief while using the spirometer.  You develop fever of 100.5 F (38.1 C) or higher. SEEK IMMEDIATE MEDICAL CARE IF:   You cough up bloody sputum that had not been present before.  You develop fever of 102 F (38.9 C) or greater.  You develop worsening pain at or near the incision site. MAKE SURE YOU:   Understand these instructions.  Will watch your condition.  Will get help right away if you are not doing well or get worse. Document Released: 06/25/2006 Document Revised: 05/07/2011 Document Reviewed: 08/26/2006 [facility] Patient Information 2014 [place], [place].   ________________________________________________________________________

## 2015-09-06 LAB — HEMOGLOBIN A1C
Hgb A1c MFr Bld: 7.3 % — ABNORMAL HIGH (ref 4.8–5.6)
Mean Plasma Glucose: 163 mg/dL

## 2015-09-07 ENCOUNTER — Ambulatory Visit: Payer: Self-pay | Admitting: Orthopedic Surgery

## 2015-09-07 NOTE — H&P (Signed)
Destiny Shaw is an 65 y.o. female.   Chief Complaint: back and B/L leg pain HPI: The patient is a 65 year old female who presents to the practice today for a transition into care. The patient is transitioning into care from another physician and a summary of care was reviewed.  Additional reasons for visit:  Back pain is described as the following: The patient is here today in referral from Dr. Terrilee Croak with [facility]. The patient reports low back symptoms which began 76 month(s) ago following a specific injury. The injury occurred due to a fall. Symptoms are reported to be located in the right low back and Symptoms include pain. The pain radiates to the left lateral lower leg, left foot, right buttock, right posterior thigh and right foot. Symptoms are exacerbated by standing and sitting. Symptoms are relieved by recumbency. Current treatment includes non-opioid analgesics (prn). Prior to being seen today the patient was previously evaluated by a primary physician. Past evaluation has included x-ray of the lumbar spine and MRI of the lumbar spine. Past treatment has included nonsteroidal anti-inflammatory drugs, non-opioid analgesics, muscle relaxants, corticosteroids and epidural injections.  Destiny Shaw is here for a second opinion for consultation. She is a pleasant female of 48, who was reported about a year and a half ago she had a fall on the buttock on concrete, had developed back pain and lower extremity pain. She has had persistent symptoms, since she has been evaluated and treated at the [facility] by Dr. Terrilee Croak. She is predominantly having right leg, but she is also having left leg symptoms worse with standing, better with sitting, although she leans left and right. She was at the [facility] to see him. She has tried epidurals at L4-L5, which has given her temporary relief. She is unable to exercise due to the pain. She has type 2 diabetes. She reports no problems prior to  this fall. She does not have nerve conduction study. She is felt by MRI to have a disk herniation and spinal stenosis or bilateral lateral recess stenosis. Facet arthropathy at L5-S1 was noted. She was kindly referred here for evaluation.  I reviewed her outside medical records.  REVIEW OF SYSTEMS Review of systems is negative for fevers, chest pain, shortness of breath, unexplained recent weight loss, loss of bowel or bladder function, burning with urination, joint swelling, rashes, weakness or numbness, difficulty with balance, easy bruising, excessive thirst or frequent urination.  Past Medical History  Diagnosis Date  . Diabetes mellitus without complication (Destiny Shaw)   . Anemia   . GERD (gastroesophageal reflux disease)   . Hyperlipidemia   . IBS (irritable bowel syndrome)   . Sleep apnea   . Concussion   . ADD (attention deficit disorder)   . Hypertension   . Hypothyroidism   . Depression   . Anxiety   . Anginal pain ([place])     pt states has occas chest pain relates to indigestion; pt uses rest to relieve   . Wears glasses   . Numbness     right leg   . Numbness in both hands     comes and goes   . Dizziness   . Fall   . Imbalance   . Pneumonia     last episode approx 1 year ago   . History of urinary tract infection   . Headache   . Arthritis     Past Surgical History  Procedure Laterality Date  . Breast  surgery    . Carpal tunnel release    . Cesarean section      times 2  . Hernia repair    . Knee arthroscopy    . Rhinoplasty    . Tonsillectomy    . Tubal ligation    . De quervain's release Right   . Breast lumpectomy Right   . Uvulopalatopharyngoplasty    . Colonoscopy with propofol N/A 05/11/2015    Procedure: COLONOSCOPY WITH PROPOFOL;  Surgeon: Manya Silvas, MD;  Location: [facility] ENDOSCOPY;  Service: Endoscopy;  Laterality: N/A;  . Esophagogastroduodenoscopy (egd) with propofol N/A 05/11/2015    Procedure: ESOPHAGOGASTRODUODENOSCOPY (EGD) WITH PROPOFOL;   Surgeon: Manya Silvas, MD;  Location: [facility] ENDOSCOPY;  Service: Endoscopy;  Laterality: N/A;  . Eye surgery      laser surgery bilat     No family history on file. Social History:  reports that she has never smoked. She has never used smokeless tobacco. She reports that she does not drink alcohol or use illicit drugs.  Allergies:  Allergies  Allergen Reactions  . Tessalon [Benzonatate] Hives  . Latex Rash     (Not in a hospital admission)  No results found for this or any previous visit (from the past 48 hour(s)). No results found.  Review of Systems  Constitutional: Negative.   HENT: Negative.   Eyes: Negative.   Respiratory: Negative.   Cardiovascular: Negative.   Gastrointestinal: Negative.   Genitourinary: Negative.   Musculoskeletal: Positive for back pain.  Skin: Negative.   Neurological: Positive for sensory change and focal weakness.  Psychiatric/Behavioral: Negative.     There were no vitals taken for this visit. Physical Exam  Constitutional: She is oriented to person, place, and time.  HENT:  Head: Normocephalic.  Eyes: Pupils are equal, round, and reactive to light.  Neck: Normal range of motion.  Cardiovascular: Normal rate.   Respiratory: Effort normal.  GI: Soft.  Musculoskeletal:  Healthy female, in moderate distress. Mood and affect is appropriate. Walks with slight antalgic gait. She is tender in the lumbosacral junction. She has pain with forward flexion, extension in the lumbar spine, but limited straight leg raise. Buttock, thigh, and calf pain on the right. Buttock pain on the left. She has EHL weakness bilaterally at 4+/5. Altered sensation in the L5 dermatome on the right. Lumbar spine exam reveals no evidence of soft tissue swelling, deformity or skin ecchymosis. On palpation there is no tenderness of the lumbar spine. No flank pain with percussion. The abdomen is soft and nontender. Nontender over the trochanters. No cellulitis or  lymphadenopathy. Good range of motion of the lumbar spine without associated pain. Straight leg raise is negative. Motor is 5/5 including EHL, tibialis anterior, plantar flexion, quadriceps and hamstrings. Patient is normoreflexic. There is no Babinski or clonus. Sensory exam is intact to light touch. Patient has good distal pulses. No DVT. No pain and normal range of motion without instability of the hips, knees and ankles.  Neurological: She is alert and oriented to person, place, and time.    Three view radiographs, AP, lateral, flexion and extension demonstrates degenerative changes at L3-L4 and L5-S1, no instability in flexion and extension. 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  MRI demonstrates central disk protrusion at L4-L5 and facet arthropathy at L4-L5, L5-S1, generating lateral recess stenosis severe on the right, moderate on the left.  Assessment/Plan 1. 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 2. Non-insulin dependent diabetes. 3. Status post fall with radicular pain. 4. Facet arthrosis.  Extensive discussion with Ms. Rosana Hoes concerning current pathology, relevant anatomy, and treatment options. She currently reports her symptoms are intolerable. She is having difficulty walking. She has to sit. She is here with her husband. We reassured her she has had excellent treatment today. Epidural steroid injection with temporary relief. Activity modification and analgesics one other option other than living with her symptoms and utilizing gabapentin will be to consider a decompression at L4-L5 bilaterally. That would address her leg pain, not her back pain. It seems that her right leg has the worst pain that she is describing. We discussed a decompression and living with the residual facet arthrosis. She may  require discectomy as well. We discussed the decompression in detail. I had an extensive discussion of the risks and benefits of the lumbar decompression with the patient including bleeding, infection, damage to neurovascular structures, epidural fibrosis, CSF leak requiring repair. We also discussed increase in pain, adjacent segment disease, recurrent disc herniation, need for future surgery including repeat decompression and/or fusion. We also discussed risks of postoperative hematoma, paralysis, anesthetic complications including DVT, PE, death, cardiopulmonary dysfunction. In addition, the perioperative and postoperative courses were discussed in detail including the rehabilitative time and return to functional activity and work. I provided the patient with an illustrated handout and utilized the appropriate surgical models. Specifically residual back pain, possibility of CSF leakage given the epidurals that she has had. The need for continued activity modification. 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 We discussed if persistent symptoms, an EMG and nerve conduction study. She has been exposed to MRSA, has not had a MRSA infection. She is not allergic to penicillin. However, undergo preoperative decolonization with Bactroban and pHisoHex. We will consider the addition of either vancomycin or clindamycin perioperatively. We will contemplate their options and we will proceed with decompression accordingly. I appreciate the kind referral by Dr. Rudene Christians.  Plan microlumbar decompression L4-5  Cecilie Kicks., PA-C for Dr. Tonita Cong 09/07/2015, 4:21 PM

## 2015-09-14 ENCOUNTER — Ambulatory Visit (HOSPITAL_COMMUNITY): Payer: Medicare Other | Admitting: Registered Nurse

## 2015-09-14 ENCOUNTER — Ambulatory Visit (HOSPITAL_COMMUNITY): Payer: Medicare Other

## 2015-09-14 ENCOUNTER — Encounter (HOSPITAL_COMMUNITY): Payer: Self-pay | Admitting: *Deleted

## 2015-09-14 ENCOUNTER — Encounter (HOSPITAL_COMMUNITY)
Admission: RE | Disposition: A | Discharge status: Home / Self Care | Payer: Self-pay | Source: Ambulatory Visit | Attending: Specialist

## 2015-09-14 ENCOUNTER — Ambulatory Visit (HOSPITAL_COMMUNITY)
Admission: RE | Admit: 2015-09-14 | Discharge: 2015-09-15 | Disposition: A | Discharge status: Home / Self Care | Payer: Medicare Other | Source: Ambulatory Visit | Attending: Specialist | Admitting: Specialist

## 2015-09-14 DIAGNOSIS — I10 Essential (primary) hypertension: Secondary | ICD-10-CM | POA: Insufficient documentation

## 2015-09-14 DIAGNOSIS — Z6831 Body mass index (BMI) 31.0-31.9, adult: Secondary | ICD-10-CM | POA: Insufficient documentation

## 2015-09-14 DIAGNOSIS — Z79899 Other long term (current) drug therapy: Secondary | ICD-10-CM | POA: Insufficient documentation

## 2015-09-14 DIAGNOSIS — K219 Gastro-esophageal reflux disease without esophagitis: Secondary | ICD-10-CM | POA: Diagnosis not present

## 2015-09-14 DIAGNOSIS — M5116 Intervertebral disc disorders with radiculopathy, lumbar region: Secondary | ICD-10-CM | POA: Insufficient documentation

## 2015-09-14 DIAGNOSIS — M1990 Unspecified osteoarthritis, unspecified site: Secondary | ICD-10-CM | POA: Insufficient documentation

## 2015-09-14 DIAGNOSIS — M419 Scoliosis, unspecified: Secondary | ICD-10-CM | POA: Insufficient documentation

## 2015-09-14 DIAGNOSIS — E785 Hyperlipidemia, unspecified: Secondary | ICD-10-CM | POA: Diagnosis not present

## 2015-09-14 DIAGNOSIS — G4730 Sleep apnea, unspecified: Secondary | ICD-10-CM | POA: Insufficient documentation

## 2015-09-14 DIAGNOSIS — E119 Type 2 diabetes mellitus without complications: Secondary | ICD-10-CM | POA: Diagnosis not present

## 2015-09-14 DIAGNOSIS — Z7984 Long term (current) use of oral hypoglycemic drugs: Secondary | ICD-10-CM | POA: Diagnosis not present

## 2015-09-14 DIAGNOSIS — M4726 Other spondylosis with radiculopathy, lumbar region: Secondary | ICD-10-CM | POA: Diagnosis not present

## 2015-09-14 DIAGNOSIS — Z419 Encounter for procedure for purposes other than remedying health state, unspecified: Secondary | ICD-10-CM

## 2015-09-14 DIAGNOSIS — F329 Major depressive disorder, single episode, unspecified: Secondary | ICD-10-CM | POA: Insufficient documentation

## 2015-09-14 DIAGNOSIS — M48061 Spinal stenosis, lumbar region without neurogenic claudication: Secondary | ICD-10-CM

## 2015-09-14 DIAGNOSIS — Z9181 History of falling: Secondary | ICD-10-CM | POA: Diagnosis not present

## 2015-09-14 DIAGNOSIS — M549 Dorsalgia, unspecified: Secondary | ICD-10-CM | POA: Diagnosis present

## 2015-09-14 DIAGNOSIS — M4806 Spinal stenosis, lumbar region: Secondary | ICD-10-CM | POA: Insufficient documentation

## 2015-09-14 HISTORY — PX: LUMBAR LAMINECTOMY/DECOMPRESSION MICRODISCECTOMY: SHX5026

## 2015-09-14 LAB — GLUCOSE, CAPILLARY
GLUCOSE-CAPILLARY: 180 mg/dL — AB (ref 65–99)
GLUCOSE-CAPILLARY: 201 mg/dL — AB (ref 65–99)
Glucose-Capillary: 159 mg/dL — ABNORMAL HIGH (ref 65–99)
Glucose-Capillary: 219 mg/dL — ABNORMAL HIGH (ref 65–99)
Glucose-Capillary: 176 mg/dL — ABNORMAL HIGH (ref 65–99)

## 2015-09-14 LAB — TYPE AND SCREEN
ABO/RH(D): O POS
Antibody Screen: NEGATIVE

## 2015-09-14 SURGERY — LUMBAR LAMINECTOMY/DECOMPRESSION MICRODISCECTOMY
Anesthesia: General | Site: Back | Laterality: Bilateral

## 2015-09-14 MED ORDER — DOCUSATE SODIUM 100 MG PO CAPS: 100.0000 mg | ORAL_CAPSULE | Freq: Two times a day (BID) | ORAL | Status: DC | PRN

## 2015-09-14 MED ORDER — ONDANSETRON HCL 4 MG/2ML IJ SOLN
4.0000 mg | INTRAMUSCULAR | Status: DC | PRN
  Administered 2015-09-14 – 2015-09-15 (×2): 4 mg via INTRAVENOUS
  Filled 2015-09-14 (×2): qty 2

## 2015-09-14 MED ORDER — DOCUSATE SODIUM 100 MG PO CAPS
100.0000 mg | ORAL_CAPSULE | Freq: Two times a day (BID) | ORAL | Status: DC
  Administered 2015-09-15: 100 mg via ORAL
  Filled 2015-09-14 (×2): qty 1

## 2015-09-14 MED ORDER — PROPRANOLOL HCL 20 MG PO TABS
20.0000 mg | ORAL_TABLET | Freq: Two times a day (BID) | ORAL | Status: DC
  Administered 2015-09-14 – 2015-09-15 (×3): 20 mg via ORAL
  Filled 2015-09-14 (×3): qty 1

## 2015-09-14 MED ORDER — INSULIN ASPART 100 UNIT/ML ~~LOC~~ SOLN
0.0000 [IU] | Freq: Three times a day (TID) | SUBCUTANEOUS | Status: DC
  Administered 2015-09-14: 3 [IU] via SUBCUTANEOUS
  Administered 2015-09-14: 5 [IU] via SUBCUTANEOUS
  Administered 2015-09-15: 2 [IU] via SUBCUTANEOUS

## 2015-09-14 MED ORDER — RISAQUAD PO CAPS
1.0000 | ORAL_CAPSULE | ORAL | Status: DC
  Administered 2015-09-14: 1 via ORAL
  Filled 2015-09-14: qty 1

## 2015-09-14 MED ORDER — PHENOL 1.4 % MT LIQD
1.0000 | OROMUCOSAL | Status: DC | PRN
Start: 2015-09-14 — End: 2015-09-15

## 2015-09-14 MED ORDER — PROPOFOL 10 MG/ML IV BOLUS
INTRAVENOUS | Status: AC
  Filled 2015-09-14: qty 20

## 2015-09-14 MED ORDER — LACTATED RINGERS IV SOLN
INTRAVENOUS | Status: DC
  Administered 2015-09-14: 1000 mL via INTRAVENOUS
  Administered 2015-09-14: 10:00:00 via INTRAVENOUS

## 2015-09-14 MED ORDER — CEFAZOLIN SODIUM-DEXTROSE 2-4 GM/100ML-% IV SOLN
INTRAVENOUS | Status: AC
  Filled 2015-09-14: qty 100

## 2015-09-14 MED ORDER — METHOCARBAMOL 1000 MG/10ML IJ SOLN
500.0000 mg | Freq: Four times a day (QID) | INTRAMUSCULAR | Status: DC | PRN
  Filled 2015-09-14: qty 5

## 2015-09-14 MED ORDER — BUPIVACAINE-EPINEPHRINE (PF) 0.5% -1:200000 IJ SOLN
INTRAMUSCULAR | Status: AC
Start: 2015-09-14 — End: 2015-09-14
  Filled 2015-09-14: qty 30

## 2015-09-14 MED ORDER — ACETAMINOPHEN 650 MG RE SUPP: 650.0000 mg | RECTAL | Status: DC | PRN

## 2015-09-14 MED ORDER — POLYMYXIN B SULFATE 500000 UNITS IJ SOLR
INTRAMUSCULAR | Status: DC | PRN
  Administered 2015-09-14: 500 mL

## 2015-09-14 MED ORDER — ROCURONIUM BROMIDE 100 MG/10ML IV SOLN
INTRAVENOUS | Status: DC | PRN
  Administered 2015-09-14: 10 mg via INTRAVENOUS
  Administered 2015-09-14: 40 mg via INTRAVENOUS
  Administered 2015-09-14: 10 mg via INTRAVENOUS

## 2015-09-14 MED ORDER — SUGAMMADEX SODIUM 200 MG/2ML IV SOLN
INTRAVENOUS | Status: AC
  Filled 2015-09-14: qty 2

## 2015-09-14 MED ORDER — SODIUM CHLORIDE 0.9 % IR SOLN
Status: AC
  Filled 2015-09-14: qty 1

## 2015-09-14 MED ORDER — MIDAZOLAM HCL 5 MG/5ML IJ SOLN
INTRAMUSCULAR | Status: DC | PRN
  Administered 2015-09-14 (×2): 1 mg via INTRAVENOUS

## 2015-09-14 MED ORDER — DEXAMETHASONE SODIUM PHOSPHATE 10 MG/ML IJ SOLN
INTRAMUSCULAR | Status: AC
  Filled 2015-09-14: qty 1

## 2015-09-14 MED ORDER — ARTIFICIAL TEARS OP OINT
TOPICAL_OINTMENT | OPHTHALMIC | Status: AC
  Filled 2015-09-14: qty 3.5

## 2015-09-14 MED ORDER — ACETAMINOPHEN 325 MG PO TABS: 650.0000 mg | ORAL_TABLET | ORAL | Status: DC | PRN

## 2015-09-14 MED ORDER — CEFAZOLIN SODIUM-DEXTROSE 2-4 GM/100ML-% IV SOLN
2.0000 g | Freq: Three times a day (TID) | INTRAVENOUS | Status: AC
  Administered 2015-09-14 (×2): 2 g via INTRAVENOUS
  Filled 2015-09-14 (×2): qty 100

## 2015-09-14 MED ORDER — TRAMADOL HCL 50 MG PO TABS
50.0000 mg | ORAL_TABLET | Freq: Four times a day (QID) | ORAL | Status: DC | PRN
  Administered 2015-09-14: 50 mg via ORAL
  Filled 2015-09-14: qty 1

## 2015-09-14 MED ORDER — HYDROMORPHONE HCL 1 MG/ML IJ SOLN
0.2500 mg | INTRAMUSCULAR | Status: DC | PRN
  Administered 2015-09-14 (×4): 0.5 mg via INTRAVENOUS

## 2015-09-14 MED ORDER — BUPIVACAINE-EPINEPHRINE 0.5% -1:200000 IJ SOLN
INTRAMUSCULAR | Status: DC | PRN
  Administered 2015-09-14: 15 mL

## 2015-09-14 MED ORDER — HYDROCODONE-ACETAMINOPHEN 5-325 MG PO TABS: 1.0000 | ORAL_TABLET | ORAL | Status: DC | PRN

## 2015-09-14 MED ORDER — OXYCODONE-ACETAMINOPHEN 5-325 MG PO TABS
1.0000 | ORAL_TABLET | ORAL | Status: DC | PRN
  Administered 2015-09-14: 2 via ORAL
  Administered 2015-09-14 (×2): 1 via ORAL
  Administered 2015-09-15 (×2): 2 via ORAL
  Filled 2015-09-14: qty 1
  Filled 2015-09-14 (×3): qty 2
  Filled 2015-09-14: qty 1

## 2015-09-14 MED ORDER — HYDROMORPHONE HCL 1 MG/ML IJ SOLN
0.5000 mg | INTRAMUSCULAR | Status: DC | PRN
  Administered 2015-09-14 – 2015-09-15 (×3): 1 mg via INTRAVENOUS
  Filled 2015-09-14 (×3): qty 1

## 2015-09-14 MED ORDER — BISACODYL 5 MG PO TBEC
5.0000 mg | DELAYED_RELEASE_TABLET | Freq: Every day | ORAL | Status: DC | PRN
Start: 2015-09-14 — End: 2015-09-15

## 2015-09-14 MED ORDER — EPHEDRINE SULFATE 50 MG/ML IJ SOLN
INTRAMUSCULAR | Status: DC | PRN
  Administered 2015-09-14: 5 mg via INTRAVENOUS
  Administered 2015-09-14 (×2): 10 mg via INTRAVENOUS
  Administered 2015-09-14: 5 mg via INTRAVENOUS

## 2015-09-14 MED ORDER — HYDROMORPHONE HCL 1 MG/ML IJ SOLN
INTRAMUSCULAR | Status: AC
  Filled 2015-09-14: qty 1

## 2015-09-14 MED ORDER — MIDAZOLAM HCL 2 MG/2ML IJ SOLN
INTRAMUSCULAR | Status: AC
  Filled 2015-09-14: qty 2

## 2015-09-14 MED ORDER — ONDANSETRON HCL 4 MG/2ML IJ SOLN
INTRAMUSCULAR | Status: AC
  Filled 2015-09-14: qty 2

## 2015-09-14 MED ORDER — ZOLPIDEM TARTRATE 10 MG PO TABS: 10.0000 mg | ORAL_TABLET | Freq: Every evening | ORAL | Status: DC | PRN

## 2015-09-14 MED ORDER — EPHEDRINE SULFATE 50 MG/ML IJ SOLN
INTRAMUSCULAR | Status: AC
  Filled 2015-09-14: qty 1

## 2015-09-14 MED ORDER — MEPERIDINE HCL 50 MG/ML IJ SOLN: 6.2500 mg | INTRAMUSCULAR | Status: DC | PRN

## 2015-09-14 MED ORDER — PHENYLEPHRINE HCL 10 MG/ML IJ SOLN
INTRAMUSCULAR | Status: DC | PRN
  Administered 2015-09-14: 80 ug via INTRAVENOUS

## 2015-09-14 MED ORDER — FLUOXETINE HCL 20 MG PO CAPS
40.0000 mg | ORAL_CAPSULE | Freq: Every day | ORAL | Status: DC
Start: 2015-09-14 — End: 2015-09-15
  Administered 2015-09-15: 40 mg via ORAL
  Filled 2015-09-14 (×2): qty 2

## 2015-09-14 MED ORDER — THROMBIN 5000 UNITS EX SOLR
CUTANEOUS | Status: AC
  Filled 2015-09-14: qty 10000

## 2015-09-14 MED ORDER — POTASSIUM CHLORIDE IN NACL 20-0.45 MEQ/L-% IV SOLN
INTRAVENOUS | Status: DC
  Administered 2015-09-14: 12:00:00 via INTRAVENOUS
  Filled 2015-09-14 (×2): qty 1000

## 2015-09-14 MED ORDER — LOSARTAN POTASSIUM 50 MG PO TABS
50.0000 mg | ORAL_TABLET | Freq: Every day | ORAL | Status: DC
  Administered 2015-09-15: 50 mg via ORAL
  Filled 2015-09-14 (×2): qty 1

## 2015-09-14 MED ORDER — CHLORHEXIDINE GLUCONATE 0.12 % MT SOLN
15.0000 mL | Freq: Every day | OROMUCOSAL | Status: DC
  Administered 2015-09-15: 15 mL via OROMUCOSAL
  Filled 2015-09-14 (×2): qty 15

## 2015-09-14 MED ORDER — SODIUM CHLORIDE 0.9 % IJ SOLN
INTRAMUSCULAR | Status: AC
  Filled 2015-09-14: qty 10

## 2015-09-14 MED ORDER — MAGNESIUM CITRATE PO SOLN: 1.0000 | Freq: Once | ORAL | Status: DC | PRN

## 2015-09-14 MED ORDER — DEXAMETHASONE SODIUM PHOSPHATE 10 MG/ML IJ SOLN
INTRAMUSCULAR | Status: DC | PRN
Start: 2015-09-14 — End: 2015-09-14
  Administered 2015-09-14: 10 mg via INTRAVENOUS

## 2015-09-14 MED ORDER — POLYETHYLENE GLYCOL 3350 17 G PO PACK: 17.0000 g | PACK | Freq: Every day | ORAL | Status: DC

## 2015-09-14 MED ORDER — ALUM & MAG HYDROXIDE-SIMETH 200-200-20 MG/5ML PO SUSP: 30.0000 mL | Freq: Four times a day (QID) | ORAL | Status: DC | PRN

## 2015-09-14 MED ORDER — PROPOFOL 10 MG/ML IV BOLUS
INTRAVENOUS | Status: DC | PRN
  Administered 2015-09-14: 160 mg via INTRAVENOUS

## 2015-09-14 MED ORDER — SUGAMMADEX SODIUM 200 MG/2ML IV SOLN
INTRAVENOUS | Status: DC | PRN
  Administered 2015-09-14: 200 mg via INTRAVENOUS

## 2015-09-14 MED ORDER — ONDANSETRON HCL 4 MG/2ML IJ SOLN
INTRAMUSCULAR | Status: DC | PRN
  Administered 2015-09-14: 4 mg via INTRAVENOUS

## 2015-09-14 MED ORDER — FENTANYL CITRATE (PF) 100 MCG/2ML IJ SOLN
INTRAMUSCULAR | Status: DC | PRN
  Administered 2015-09-14 (×2): 50 ug via INTRAVENOUS

## 2015-09-14 MED ORDER — PHENYLEPHRINE 40 MCG/ML (10ML) SYRINGE FOR IV PUSH (FOR BLOOD PRESSURE SUPPORT)
PREFILLED_SYRINGE | INTRAVENOUS | Status: AC
  Filled 2015-09-14: qty 10

## 2015-09-14 MED ORDER — PROMETHAZINE HCL 25 MG/ML IJ SOLN: 6.2500 mg | INTRAMUSCULAR | Status: DC | PRN

## 2015-09-14 MED ORDER — LIDOCAINE HCL (CARDIAC) 20 MG/ML IV SOLN
INTRAVENOUS | Status: AC
  Filled 2015-09-14: qty 5

## 2015-09-14 MED ORDER — MENTHOL 3 MG MT LOZG
1.0000 | LOZENGE | OROMUCOSAL | Status: DC | PRN
Start: 2015-09-14 — End: 2015-09-15

## 2015-09-14 MED ORDER — TOPIRAMATE 25 MG PO TABS
25.0000 mg | ORAL_TABLET | Freq: Two times a day (BID) | ORAL | Status: DC
  Administered 2015-09-14 – 2015-09-15 (×3): 25 mg via ORAL
  Filled 2015-09-14 (×3): qty 1

## 2015-09-14 MED ORDER — LIDOCAINE HCL (CARDIAC) 20 MG/ML IV SOLN
INTRAVENOUS | Status: DC | PRN
  Administered 2015-09-14: 80 mg via INTRAVENOUS

## 2015-09-14 MED ORDER — FENTANYL CITRATE (PF) 250 MCG/5ML IJ SOLN
INTRAMUSCULAR | Status: AC
  Filled 2015-09-14: qty 5

## 2015-09-14 MED ORDER — SENNOSIDES-DOCUSATE SODIUM 8.6-50 MG PO TABS
2.0000 | ORAL_TABLET | Freq: Every day | ORAL | Status: DC
  Administered 2015-09-15: 2 via ORAL
  Filled 2015-09-14 (×2): qty 2

## 2015-09-14 MED ORDER — METHOCARBAMOL 500 MG PO TABS
500.0000 mg | ORAL_TABLET | Freq: Four times a day (QID) | ORAL | Status: DC | PRN
Start: 2015-09-14 — End: 2015-09-15
  Administered 2015-09-14 – 2015-09-15 (×2): 500 mg via ORAL
  Filled 2015-09-14 (×2): qty 1

## 2015-09-14 MED ORDER — HEMOSTATIC AGENTS (NO CHARGE) OPTIME
TOPICAL | Status: DC | PRN
  Administered 2015-09-14: 1 via TOPICAL

## 2015-09-14 MED ORDER — CEFAZOLIN SODIUM-DEXTROSE 2-4 GM/100ML-% IV SOLN
2.0000 g | INTRAVENOUS | Status: AC
  Administered 2015-09-14: 2 g via INTRAVENOUS

## 2015-09-14 MED ORDER — POLYETHYLENE GLYCOL 3350 17 G PO PACK: 17.0000 g | PACK | Freq: Every day | ORAL | Status: DC | PRN

## 2015-09-14 MED ORDER — ROCURONIUM BROMIDE 100 MG/10ML IV SOLN
INTRAVENOUS | Status: AC
  Filled 2015-09-14: qty 1

## 2015-09-14 SURGICAL SUPPLY — 50 items
AGENT HMST SPONGE THK3/8 (HEMOSTASIS) ×1
BAG SPEC THK2 15X12 ZIP CLS (MISCELLANEOUS)
BAG ZIPLOCK 12X15 (MISCELLANEOUS) IMPLANT
CLOSURE WOUND 1/2 X4 (GAUZE/BANDAGES/DRESSINGS) ×1
CLOTH 2% CHLOROHEXIDINE 3PK (PERSONAL CARE ITEMS) ×3 IMPLANT
DRAPE MICROSCOPE LEICA (MISCELLANEOUS) ×3 IMPLANT
DRAPE SHEET LG 3/4 BI-LAMINATE (DRAPES) IMPLANT
DRAPE SURG 17X11 SM STRL (DRAPES) ×3 IMPLANT
DRAPE UTILITY XL STRL (DRAPES) ×3 IMPLANT
DRESSING AQUACEL AG SP 3.5X6 (GAUZE/BANDAGES/DRESSINGS) ×1 IMPLANT
DRSG AQUACEL AG ADV 3.5X 4 (GAUZE/BANDAGES/DRESSINGS) IMPLANT
DRSG AQUACEL AG ADV 3.5X 6 (GAUZE/BANDAGES/DRESSINGS) IMPLANT
DRSG AQUACEL AG SP 3.5X6 (GAUZE/BANDAGES/DRESSINGS) ×3
DURAPREP 26ML APPLICATOR (WOUND CARE) ×3 IMPLANT
DURASEAL SPINE SEALANT 3ML (MISCELLANEOUS) IMPLANT
ELECT BLADE TIP CTD 4 INCH (ELECTRODE) ×3 IMPLANT
ELECT REM PT RETURN 9FT ADLT (ELECTROSURGICAL) ×3
ELECTRODE REM PT RTRN 9FT ADLT (ELECTROSURGICAL) ×1 IMPLANT
GLOVE BIOGEL PI IND STRL 7.0 (GLOVE) ×2 IMPLANT
GLOVE BIOGEL PI IND STRL 7.5 (GLOVE) ×2 IMPLANT
GLOVE BIOGEL PI INDICATOR 7.0 (GLOVE) ×4
GLOVE BIOGEL PI INDICATOR 7.5 (GLOVE) ×4
GLOVE SURG SS PI 7.0 STRL IVOR (GLOVE) ×6 IMPLANT
GLOVE SURG SS PI 7.5 STRL IVOR (GLOVE) ×6 IMPLANT
GLOVE SURG SS PI 8.0 STRL IVOR (GLOVE) ×6 IMPLANT
GOWN STRL REUS W/TWL LRG LVL3 (GOWN DISPOSABLE) ×3 IMPLANT
GOWN STRL REUS W/TWL XL LVL3 (GOWN DISPOSABLE) ×9 IMPLANT
HEMOSTAT SPONGE AVITENE ULTRA (HEMOSTASIS) ×3 IMPLANT
IV CATH 14GX2 1/4 (CATHETERS) IMPLANT
KIT BASIN OR (CUSTOM PROCEDURE TRAY) ×3 IMPLANT
KIT POSITIONING SURG ANDREWS (MISCELLANEOUS) ×3 IMPLANT
MANIFOLD NEPTUNE II (INSTRUMENTS) ×3 IMPLANT
NEEDLE SPNL 18GX3.5 QUINCKE PK (NEEDLE) ×6 IMPLANT
PACK LAMINECTOMY ORTHO (CUSTOM PROCEDURE TRAY) ×3 IMPLANT
PATTIES SURGICAL .5 X.5 (GAUZE/BANDAGES/DRESSINGS) IMPLANT
PATTIES SURGICAL .75X.75 (GAUZE/BANDAGES/DRESSINGS) ×3 IMPLANT
RUBBERBAND STERILE (MISCELLANEOUS) ×6 IMPLANT
STAPLER VISISTAT (STAPLE) IMPLANT
STRIP CLOSURE SKIN 1/2X4 (GAUZE/BANDAGES/DRESSINGS) ×2 IMPLANT
SUT NURALON 4 0 TR CR/8 (SUTURE) IMPLANT
SUT PROLENE 3 0 PS 2 (SUTURE) IMPLANT
SUT VIC AB 1 CT1 27 (SUTURE) ×6
SUT VIC AB 1 CT1 27XBRD ANTBC (SUTURE) ×2 IMPLANT
SUT VIC AB 1-0 CT2 27 (SUTURE) IMPLANT
SUT VIC AB 2-0 CT1 27 (SUTURE)
SUT VIC AB 2-0 CT1 TAPERPNT 27 (SUTURE) IMPLANT
SUT VIC AB 2-0 CT2 27 (SUTURE) IMPLANT
SYR 3ML LL SCALE MARK (SYRINGE) IMPLANT
TOWEL OR 17X26 10 PK STRL BLUE (TOWEL DISPOSABLE) ×3 IMPLANT
YANKAUER SUCT BULB TIP NO VENT (SUCTIONS) ×3 IMPLANT

## 2015-09-14 NOTE — Discharge Instructions (Signed)

## 2015-09-14 NOTE — H&P (View-Only) (Signed)
Destiny Shaw is an 65 y.o. female.   Chief Complaint: back and B/L leg pain HPI: The patient is a 65 year old female who presents to the practice today for a transition into care. The patient is transitioning into care from another physician and a summary of care was reviewed.  Additional reasons for visit:  Back pain is described as the following: The patient is here today in referral from Dr. Terrilee Croak with [facility]. The patient reports low back symptoms which began 34 month(s) ago following a specific injury. The injury occurred due to a fall. Symptoms are reported to be located in the right low back and Symptoms include pain. The pain radiates to the left lateral lower leg, left foot, right buttock, right posterior thigh and right foot. Symptoms are exacerbated by standing and sitting. Symptoms are relieved by recumbency. Current treatment includes non-opioid analgesics (prn). Prior to being seen today the patient was previously evaluated by a primary physician. Past evaluation has included x-ray of the lumbar spine and MRI of the lumbar spine. Past treatment has included nonsteroidal anti-inflammatory drugs, non-opioid analgesics, muscle relaxants, corticosteroids and epidural injections.  Destiny Shaw is here for a second opinion for consultation. She is a pleasant female of 54, who was reported about a year and a half ago she had a fall on the buttock on concrete, had developed back pain and lower extremity pain. She has had persistent symptoms, since she has been evaluated and treated at the [facility] by Dr. Terrilee Croak. She is predominantly having right leg, but she is also having left leg symptoms worse with standing, better with sitting, although she leans left and right. She was at the [facility] to see him. She has tried epidurals at L4-L5, which has given her temporary relief. She is unable to exercise due to the pain. She has type 2 diabetes. She reports no problems prior to  this fall. She does not have nerve conduction study. She is felt by MRI to have a disk herniation and spinal stenosis or bilateral lateral recess stenosis. Facet arthropathy at L5-S1 was noted. She was kindly referred here for evaluation.  I reviewed her outside medical records.  REVIEW OF SYSTEMS Review of systems is negative for fevers, chest pain, shortness of breath, unexplained recent weight loss, loss of bowel or bladder function, burning with urination, joint swelling, rashes, weakness or numbness, difficulty with balance, easy bruising, excessive thirst or frequent urination.  Past Medical History  Diagnosis Date  . Diabetes mellitus without complication ([place])   . Anemia   . GERD (gastroesophageal reflux disease)   . Hyperlipidemia   . IBS (irritable bowel syndrome)   . Sleep apnea   . Concussion   . ADD (attention deficit disorder)   . Hypertension   . Hypothyroidism   . Depression   . Anxiety   . Anginal pain ([place])     pt states has occas chest pain relates to indigestion; pt uses rest to relieve   . Wears glasses   . Numbness     right leg   . Numbness in both hands     comes and goes   . Dizziness   . Fall   . Imbalance   . Pneumonia     last episode approx 1 year ago   . History of urinary tract infection   . Headache   . Arthritis     Past Surgical History  Procedure Laterality Date  . Breast  surgery    . Carpal tunnel release    . Cesarean section      times 2  . Hernia repair    . Knee arthroscopy    . Rhinoplasty    . Tonsillectomy    . Tubal ligation    . De quervain's release Right   . Breast lumpectomy Right   . Uvulopalatopharyngoplasty    . Colonoscopy with propofol N/A 05/11/2015    Procedure: COLONOSCOPY WITH PROPOFOL;  Surgeon: Manya Silvas, MD;  Location: [facility] ENDOSCOPY;  Service: Endoscopy;  Laterality: N/A;  . Esophagogastroduodenoscopy (egd) with propofol N/A 05/11/2015    Procedure: ESOPHAGOGASTRODUODENOSCOPY (EGD) WITH PROPOFOL;   Surgeon: Manya Silvas, MD;  Location: [facility] ENDOSCOPY;  Service: Endoscopy;  Laterality: N/A;  . Eye surgery      laser surgery bilat     No family history on file. Social History:  reports that she has never smoked. She has never used smokeless tobacco. She reports that she does not drink alcohol or use illicit drugs.  Allergies:  Allergies  Allergen Reactions  . Tessalon [Benzonatate] Hives  . Latex Rash     (Not in a hospital admission)  No results found for this or any previous visit (from the past 48 hour(s)). No results found.  Review of Systems  Constitutional: Negative.   HENT: Negative.   Eyes: Negative.   Respiratory: Negative.   Cardiovascular: Negative.   Gastrointestinal: Negative.   Genitourinary: Negative.   Musculoskeletal: Positive for back pain.  Skin: Negative.   Neurological: Positive for sensory change and focal weakness.  Psychiatric/Behavioral: Negative.     There were no vitals taken for this visit. Physical Exam  Constitutional: She is oriented to person, place, and time.  HENT:  Head: Normocephalic.  Eyes: Pupils are equal, round, and reactive to light.  Neck: Normal range of motion.  Cardiovascular: Normal rate.   Respiratory: Effort normal.  GI: Soft.  Musculoskeletal:  Healthy female, in moderate distress. Mood and affect is appropriate. Walks with slight antalgic gait. She is tender in the lumbosacral junction. She has pain with forward flexion, extension in the lumbar spine, but limited straight leg raise. Buttock, thigh, and calf pain on the right. Buttock pain on the left. She has EHL weakness bilaterally at 4+/5. Altered sensation in the L5 dermatome on the right. Lumbar spine exam reveals no evidence of soft tissue swelling, deformity or skin ecchymosis. On palpation there is no tenderness of the lumbar spine. No flank pain with percussion. The abdomen is soft and nontender. Nontender over the trochanters. No cellulitis or  lymphadenopathy. Good range of motion of the lumbar spine without associated pain. Straight leg raise is negative. Motor is 5/5 including EHL, tibialis anterior, plantar flexion, quadriceps and hamstrings. Patient is normoreflexic. There is no Babinski or clonus. Sensory exam is intact to light touch. Patient has good distal pulses. No DVT. No pain and normal range of motion without instability of the hips, knees and ankles.  Neurological: She is alert and oriented to person, place, and time.    Three view radiographs, AP, lateral, flexion and extension demonstrates degenerative changes at L3-L4 and L5-S1, no instability in flexion and extension. 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  MRI demonstrates central disk protrusion at L4-L5 and facet arthropathy at L4-L5, L5-S1, generating lateral recess stenosis severe on the right, moderate on the left.  Assessment/Plan 1. 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 2. Non-insulin dependent diabetes. 3. Status post fall with radicular pain. 4. Facet arthrosis.  Extensive discussion with Destiny Shaw concerning current pathology, relevant anatomy, and treatment options. She currently reports her symptoms are intolerable. She is having difficulty walking. She has to sit. She is here with her husband. We reassured her she has had excellent treatment today. Epidural steroid injection with temporary relief. Activity modification and analgesics one other option other than living with her symptoms and utilizing gabapentin will be to consider a decompression at L4-L5 bilaterally. That would address her leg pain, not her back pain. It seems that her right leg has the worst pain that she is describing. We discussed a decompression and living with the residual facet arthrosis. She may  require discectomy as well. We discussed the decompression in detail. I had an extensive discussion of the risks and benefits of the lumbar decompression with the patient including bleeding, infection, damage to neurovascular structures, epidural fibrosis, CSF leak requiring repair. We also discussed increase in pain, adjacent segment disease, recurrent disc herniation, need for future surgery including repeat decompression and/or fusion. We also discussed risks of postoperative hematoma, paralysis, anesthetic complications including DVT, PE, death, cardiopulmonary dysfunction. In addition, the perioperative and postoperative courses were discussed in detail including the rehabilitative time and return to functional activity and work. I provided the patient with an illustrated handout and utilized the appropriate surgical models. Specifically residual back pain, possibility of CSF leakage given the epidurals that she has had. The need for continued activity modification. 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 We discussed if persistent symptoms, an EMG and nerve conduction study. She has been exposed to MRSA, has not had a MRSA infection. She is not allergic to penicillin. However, undergo preoperative decolonization with Bactroban and pHisoHex. We will consider the addition of either vancomycin or clindamycin perioperatively. We will contemplate their options and we will proceed with decompression accordingly. I appreciate the kind referral by Dr. Rudene Christians.  Plan microlumbar decompression L4-5  Cecilie Kicks., PA-C for Dr. Tonita Cong 09/07/2015, 4:21 PM

## 2015-09-14 NOTE — Anesthesia Preprocedure Evaluation (Addendum)
Anesthesia Evaluation  Patient identified by MRN, date of birth, ID band Patient awake    Reviewed: Allergy & Precautions, NPO status , Patient's Chart, lab work & pertinent test results  Airway Mallampati: III  TM Distance: <3 FB Neck ROM: Full    Dental  (+) Chipped, Caps   Pulmonary sleep apnea , pneumonia, resolved,    Pulmonary exam normal breath sounds clear to auscultation       Cardiovascular hypertension, + angina negative cardio ROS Normal cardiovascular exam Rhythm:Regular     Neuro/Psych Anxiety Depression    GI/Hepatic GERD  Medicated and Controlled,  Endo/Other  diabetes, Well Controlled, Type 2, Oral Hypoglycemic AgentsHypothyroidism   Renal/GU      Musculoskeletal  (+) Arthritis ,   Abdominal Normal abdominal exam  (+)   Peds  Hematology  (+) anemia ,   Anesthesia Other Findings   Reproductive/Obstetrics                            Anesthesia Physical  Anesthesia Plan  ASA: III  Anesthesia Plan: General   Post-op Pain Management:    Induction: Intravenous  Airway Management Planned: Oral ETT  Additional Equipment:   Intra-op Plan:   Post-operative Plan: Extubation in OR  Informed Consent: I have reviewed the patients History and Physical, chart, labs and discussed the procedure including the risks, benefits and alternatives for the proposed anesthesia with the patient or authorized representative who has indicated his/her understanding and acceptance.   Dental advisory given  Plan Discussed with: CRNA and Surgeon  Anesthesia Plan Comments:        Anesthesia Quick Evaluation

## 2015-09-14 NOTE — Interval H&P Note (Signed)
History and Physical Interval Note:  09/14/2015 8:25 AM  [name]  has presented today for surgery, with the diagnosis of stenosis L4 - L5  The various methods of treatment have been discussed with the patient and family. After consideration of risks, benefits and other options for treatment, the patient has consented to  Procedure(s): MICRO LUMBAR DECOMPRESSION L4 - L5 (N/A) as a surgical intervention .  The patient's history has been reviewed, patient examined, no change in status, stable for surgery.  I have reviewed the patient's chart and labs.  Questions were answered to the patient's satisfaction.     Destiny Shaw

## 2015-09-14 NOTE — Anesthesia Procedure Notes (Signed)
Procedure Name: Intubation Date/Time: 09/14/2015 8:39 AM Performed by: Carleene Cooper A Pre-anesthesia Checklist: Patient identified, Emergency Drugs available, Suction available, Patient being monitored and Timeout performed Patient Re-evaluated:Patient Re-evaluated prior to inductionOxygen Delivery Method: Circle system utilized Preoxygenation: Pre-oxygenation with 100% oxygen Intubation Type: IV induction Ventilation: Mask ventilation without difficulty Laryngoscope Size: Mac and 4 Grade View: Grade I Tube type: Oral Number of attempts: 1 Airway Equipment and Method: Stylet and Oral airway Placement Confirmation: ETT inserted through vocal cords under direct vision,  positive ETCO2 and breath sounds checked- equal and bilateral Secured at: 22 cm Tube secured with: Tape Dental Injury: Teeth and Oropharynx as per pre-operative assessment

## 2015-09-14 NOTE — Transfer of Care (Signed)
Immediate Anesthesia Transfer of Care Note  Patient: Destiny Shaw  Procedure(s) Performed: Procedure(s): MICRO LUMBAR BILATERAL DECOMPRESSION L4 - L5 (Bilateral)  Patient Location: PACU  Anesthesia Type:General  Level of Consciousness: awake, alert , oriented and patient cooperative  Airway & Oxygen Therapy: Patient Spontanous Breathing and Patient connected to face mask oxygen  Post-op Assessment: Report given to RN, Post -op Vital signs reviewed and stable and Patient moving all extremities  Post vital signs: Reviewed and stable  Last Vitals:  Filed Vitals:   09/14/15 0616  BP: 137/85  Pulse: 73  Temp: 36.8 C  Resp: 18    Last Pain:  Filed Vitals:   09/14/15 0756  PainSc: 4       Patients Stated Pain Goal: 4 ([id number])  Complications: No apparent anesthesia complications

## 2015-09-14 NOTE — Brief Op Note (Signed)
09/14/2015  9:52 AM  PATIENT:  Destiny Shaw  65 y.o. female  PRE-OPERATIVE DIAGNOSIS:  stenosis L4 - L5  POST-OPERATIVE DIAGNOSIS:  stenosis L4 - L5  PROCEDURE:  Procedure(s): MICRO LUMBAR BILATERAL DECOMPRESSION L4 - L5 (N/A)  SURGEON:  Surgeon(s) and Role:    * Susa Day, MD - Primary  PHYSICIAN ASSISTANT:   ASSISTANTS: [place]   ANESTHESIA:   general  EBL:  Total I/O In: -  Out: 30 [Blood:30]  BLOOD ADMINISTERED:none  DRAINS: none   LOCAL MEDICATIONS USED:  MARCAINE     SPECIMEN:  No Specimen  DISPOSITION OF SPECIMEN:  N/A  COUNTS:  YES  TOURNIQUET:  * No tourniquets in log *  DICTATION: .Other Dictation: Dictation Number [id number]  PLAN OF CARE: Admit for overnight observation  PATIENT DISPOSITION:  PACU - hemodynamically stable.   Delay start of Pharmacological VTE agent (>24hrs) due to surgical blood loss or risk of bleeding: yes

## 2015-09-14 NOTE — Anesthesia Postprocedure Evaluation (Signed)
Anesthesia Post Note  Patient: Destiny Shaw  Procedure(s) Performed: Procedure(s) (LRB): MICRO LUMBAR BILATERAL DECOMPRESSION L4 - L5 (Bilateral)  Patient location during evaluation: PACU Anesthesia Type: General Level of consciousness: sedated and patient cooperative Pain management: pain level controlled Vital Signs Assessment: post-procedure vital signs reviewed and stable Respiratory status: spontaneous breathing Cardiovascular status: stable Anesthetic complications: no    Last Vitals:  Filed Vitals:   09/14/15 0616 09/14/15 1009  BP: 137/85 156/79  Pulse: 73 100  Temp: 36.8 C 36.8 C  Resp: 18 18    Last Pain:  Filed Vitals:   09/14/15 1047  PainSc: [place]

## 2015-09-15 ENCOUNTER — Encounter (HOSPITAL_COMMUNITY): Payer: Self-pay | Admitting: Specialist

## 2015-09-15 DIAGNOSIS — M4806 Spinal stenosis, lumbar region: Secondary | ICD-10-CM | POA: Diagnosis not present

## 2015-09-15 LAB — BASIC METABOLIC PANEL
Anion gap: 7 (ref 5–15)
BUN: 9 mg/dL (ref 6–20)
CHLORIDE: 106 mmol/L (ref 101–111)
CO2: 26 mmol/L (ref 22–32)
CREATININE: 0.7 mg/dL (ref 0.44–1.00)
Calcium: 9.1 mg/dL (ref 8.9–10.3)
GFR calc non Af Amer: >60 mL/min (ref >60)
Glucose, Bld: 146 mg/dL — ABNORMAL HIGH (ref 65–99)
Potassium: 3.6 mmol/L (ref 3.5–5.1)
Sodium: 139 mmol/L (ref 135–145)

## 2015-09-15 LAB — GLUCOSE, CAPILLARY: Glucose-Capillary: 144 mg/dL — ABNORMAL HIGH (ref 65–99)

## 2015-09-15 NOTE — Care Management Note (Signed)
Case Management Note  Patient Details  Name: Destiny Shaw MRN: [id number] Date of Birth: 01/04/1951  Subjective/Objective:                  stenosis L4 - L5 Action/Plan: Discharge planning Expected Discharge Date:  09/15/15             Expected Discharge Plan:  Home/Self Care  In-House Referral:     Discharge planning Services  CM Consult  Post Acute Care Choice:    Choice offered to:  Patient  DME Arranged:  3-N-1, Walker rolling DME Agency:  [place]:  NA [place] Agency:  NA  Status of Service:  Completed, signed off  If discussed at [place] of Stay Meetings, dates discussed:    Additional Comments: CM met with pt to discuss disposition.  No HH services recc or ordered.  CM called [facility] DME rep, Germaine to please deliver the rolling walker and 3n1 to room so pt can discharge. Dellie Catholic, RN 09/15/2015, 11:20 AM

## 2015-09-15 NOTE — Op Note (Signed)
Destiny Shaw, Destiny Shaw                  ACCOUNT NO.:  [id number]  MEDICAL RECORD NO.:  [id number]  LOCATION:  9                         FACILITY:  [facility]  PHYSICIAN:  Susa Day, M.D.    DATE OF BIRTH:  [birth year]  DATE OF PROCEDURE:  09/14/2015 DATE OF DISCHARGE:                              OPERATIVE REPORT   PREOPERATIVE DIAGNOSES:  Spinal stenosis, bilaterally at L4-5.  Elevated BMI of 31.  POSTOPERATIVE DIAGNOSES:  Spinal stenosis, bilaterally at L4-5. Elevated BMI of 31.  Technical difficulty increased due to the patient's elevated BMI.  PROCEDURE PERFORMED:  Bilateral microlumbar decompression at L4-5, with bilateral foraminotomies at L4 and L5.  ANESTHESIA:  General.  ASSISTANT:  Cleophas Dunker, PA.  HISTORY:  This is a 65 year old, who has had refractory lower extremity radicular pain and L5 nerve root distribution secondary to spinal stenosis, central disk protrusion, facet arthropathy with severe stenosis particularly on lateral recess at L4-5 on her right asymptomatic side.  She had bilateral symptoms on the L5 nerve root distribution despite rest, activity modification, corticosteroid injections, neural tension signs, EHL, weakness, indicated for microlumbar decompression at 4-5.  She had no instability on flexion, extension, and mild scoliosis.  Risk and benefits were discussed including bleeding, infection, damage to neurovascular structures, DVT, PE, anesthetic complications, no change in symptoms, need for fusion in the future, etc.  TECHNIQUE:  With the patient in supine position, after the induction of adequate general anesthesia and 2 g of Kefzol, she was placed prone on the [place] frame.  All bony prominences were well padded.  Lumbar region was prepped and draped in usual sterile fashion.  Two 18-gauge spinal needles were utilized to localize the L4-5 interspace, confirmed with x-ray.  Incision was made from the spinous process of  4-5, subcutaneous tissue was dissected.  Electrocautery was utilized to achieve hemostasis.  A 0.25% Marcaine with epinephrine was infiltrated in the paraspinous tissue.  Paraspinous muscle was elevated at 4-5. McCullough retractor was placed.  Operating microscope was draped and brought on the surgical field after confirmatory radiograph obtained at 4-5.  Proceeded with well bilateral hemilaminotomies of inferior aspect of L4 and then L5 and we had to remove portion of the spinous process and the interspinous ligament due to small interlaminar windows and slight scoliosis.  Leksell rongeur was utilized to remove the inferior half bilaterally of 4.  We then continued to detaching the ligamentum flavum from the caudad edge of 4, preserving the pars.  Neuro patty was placed beneath the ligamentum flavum on both sides of the operating room table.  Used the curette to detach the ligamentum flavum from the cephalad edge of 5.  Patties were placed beneath that as well as a Woodson retractor, performed foraminotomy of 5 bilaterally with severe compression of 5 roots bilaterally and then, we decompressed the lateral recess to the medial border of the pedicle from both sides of the operating room table.  I then performed foraminotomies bilaterally of L4 as well due to the stenosis noted.  She had bulging disk bilaterally and an epidural venous plexus was cauterized, but did not feel there was a frank herniation.  In effort to  preserve the disk and structures, we felt there was adequate room now the L4 and L5 nerve roots; therefore, did not perform the diskectomy.  Neuroprobe passed freely at the foramen of 4 and 5 and we obtained confirmatory radiographs with markers in the 4 and 5 foramen.  We copiously irrigated the wound and there was good restoration of the thecal sac.  This was consistent with pathology noted on the clinical exam and radiographs.  Next, we placed Gelfoam in the laminotomy  defect.  We removed the McCullough retractor and no evidence of active bleeding.  Prior to the Gelfoam, there was no CSF leakage or active bleeding.  I then copiously irrigated the wound.  We reapproximated the dorsolumbar fascia with 1 Vicryl, subcu with 2-0, and skin with staples.  Wound was dressed sterilely.  Placed supine on the hospital bed, extubated without difficulty and transported to the recovery room in satisfactory condition.  The patient tolerated the procedure well.  No complications.  Assistant, Cleophas Dunker, [place].  Minimal blood loss.  Cleophas Dunker, PA was used throughout the case for patient positioning, gentle intermittent neural traction, closure.  We used due to her elevated BMI, extended long retractors and we closed the subcu and multiple layers of 2-0.     Susa Day, M.D.     Geralynn Rile  D:  09/14/2015  T:  09/15/2015  Job:  [id number]

## 2015-09-15 NOTE — Progress Notes (Signed)
   09/15/15 1000  PT Time Calculation  PT Start Time (ACUTE ONLY) 0952  PT Stop Time (ACUTE ONLY) 1035  PT Time Calculation (min) (ACUTE ONLY) 43 min  PT G-Codes **NOT FOR INPATIENT CLASS**  Functional Assessment Tool Used clinical judgement  Functional Limitation Mobility: Walking and moving around  Mobility: Walking and Moving Around Current Status [id number]) CI  Mobility: Walking and Moving Around Goal Status [id number]) CI  Mobility: Walking and Moving Around Discharge Status [id number]) CI  PT General Charges  $$ ACUTE PT VISIT 1 Procedure  PT Evaluation  $PT Eval Low Complexity 1 Procedure  PT Treatments  $Gait Training 8-22 mins

## 2015-09-15 NOTE — Progress Notes (Signed)
Occupational Therapy Evaluation Patient Details Name: Destiny Shaw MRN: [id number] DOB: 02/27/1950 Today's Date: 09/15/2015    History of Present Illness s/p MICRO LUMBAR BILATERAL DECOMPRESSION L4 - L5 (Bilateral)   Clinical Impression   All OT education completed and pt questions answered. No further OT needs at this time. Will sign off.    Follow Up Recommendations  No OT follow up;Supervision/Assistance - 24 hour    Equipment Recommendations  3 in 1 bedside comode    Recommendations for Other Services PT consult     Precautions / Restrictions Precautions Precautions: Back Precaution Booklet Issued: Yes (comment) Precaution Comments: back precaution handout given to patient and reviewed with patient Restrictions Weight Bearing Restrictions: No      Mobility Bed Mobility Overal bed mobility: Needs Assistance Bed Mobility: Rolling;Sidelying to Sit Rolling: Supervision Sidelying to sit: Supervision       General bed mobility comments: cues for log roll technique  Transfers Overall transfer level: Needs assistance Equipment used: None Transfers: Sit to/from Stand Sit to Stand: Supervision              Balance                                            ADL Overall ADL's : Needs assistance/impaired Eating/Feeding: Independent;Sitting   Grooming: Wash/dry hands;Supervision/safety;Standing   Upper Body Bathing: Set up;Sitting   Lower Body Bathing: Supervison/ safety;Adhering to back precautions;Sit to/from stand   Upper Body Dressing : Set up;Sitting   Lower Body Dressing: Supervision/safety;Adhering to back precautions;Sit to/from stand   Toilet Transfer: Supervision/safety;Ambulation;BSC   Toileting- Water quality scientist and Hygiene: Supervision/safety;Sit to/from stand Toileting - Clothing Manipulation Details (indicate cue type and reason): reviewed toilet aide and how to use Tub/ Shower Transfer: Walk-in  shower;Supervision/safety;Adhering to back precautions;3 in 1   Functional mobility during ADLs: Supervision/safety General ADL Comments: Patient given back precautions handout and educated on ADL implications and basic functional mobility. Patient and husband asked a lot of questions, which were answered by OT. They plan to purchase kitchen tongs to use as toilet aide, reacher, and long sponge. Patient would like a 3 in 1 for discharge to use both over the toilet and as a shower chair.      Vision     Perception     Praxis      Pertinent Vitals/Pain Pain Assessment: 0-10 Pain Score: 4  Pain Location: back, B hips/legs Pain Descriptors / Indicators: Aching;Sore;Sharp Pain Intervention(s): Monitored during session     Hand Dominance Right   Extremity/Trunk Assessment Upper Extremity Assessment Upper Extremity Assessment: Overall WFL for tasks assessed   Lower Extremity Assessment Lower Extremity Assessment: Defer to PT evaluation       Communication Communication Communication: No difficulties   Cognition Arousal/Alertness: Awake/alert Behavior During Therapy: WFL for tasks assessed/performed Overall Cognitive Status: Within Functional Limits for tasks assessed                     General Comments       Exercises       Shoulder Instructions      Home Living Family/patient expects to be discharged to:: Private residence Living Arrangements: Spouse/significant other Available Help at Discharge: Family Type of Home: House Home Access: Stairs to enter Technical brewer of Steps: 3-5 Entrance Stairs-Rails: Left Home Layout: One level     Bathroom  Shower/Tub: Occupational psychologist: Handicapped height     Home Equipment: None          Prior Functioning/Environment Level of Independence: Independent             OT Diagnosis: Acute pain   OT Problem List: Decreased strength;Decreased activity tolerance;Decreased knowledge of  use of DME or AE;Decreased knowledge of precautions;Pain   OT Treatment/Interventions:      OT Goals(Current goals can be found in the care plan section) Acute Rehab OT Goals Patient Stated Goal: decreased pain OT Goal Formulation: All assessment and education complete, DC therapy  OT Frequency:     Barriers to D/C:            Co-evaluation              End of Session Nurse Communication: Mobility status  Activity Tolerance: Patient tolerated treatment well Patient left: in chair;with call bell/phone within reach;with family/visitor present   Time: [id number] OT Time Calculation (min): 40 min Charges:  OT General Charges $OT Visit: 1 Procedure OT Evaluation $OT Eval Low Complexity: 1 Procedure OT Treatments $Self Care/Home Management : 23-37 mins G-Codes: OT G-codes **NOT FOR INPATIENT CLASS** Functional Assessment Tool Used: clinical judgment Functional Limitation: Self care Self Care Current Status [id number]): At least 1 percent but less than 20 percent impaired, limited or restricted Self Care Goal Status [id number]): At least 1 percent but less than 20 percent impaired, limited or restricted Self Care Discharge Status [phone]): At least 1 percent but less than 20 percent impaired, limited or restricted  Ruthella Kirchman A 09/15/2015, 10:24 AM

## 2015-09-15 NOTE — Progress Notes (Signed)
Subjective: 1 Day Post-Op Procedure(s) (LRB): MICRO LUMBAR BILATERAL DECOMPRESSION L4 - L5 (Bilateral) Patient reports pain as 4 on 0-10 scale.    Objective: Vital signs in last 24 hours: Temp:  [97.5 F (36.4 C)-98.2 F (36.8 C)] 98.1 F (36.7 C) (07/20 0533) Pulse Rate:  [59-93] 59 (07/20 0533) Resp:  [10-16] 16 (07/20 0533) BP: (126-160)/(54-87) 126/66 mmHg (07/20 0533) SpO2:  [92 %-98 %] 96 % (07/20 0533)  Intake/Output from previous day: 07/19 0701 - 07/20 0700 In: 2172.5 [P.O.:240; I.V.:1832.5; IV Piggyback:100] Out: 1330 [Urine:1300; Blood:30] Intake/Output this shift:    No results for input(s): HGB in the last 72 hours. No results for input(s): WBC, RBC, HCT, PLT in the last 72 hours.  Recent Labs  09/15/15 0516  NA 139  K 3.6  CL 106  CO2 26  BUN 9  CREATININE 0.70  GLUCOSE 146*  CALCIUM 9.1   No results for input(s): LABPT, INR in the last 72 hours.  Neurologically intact Sensation intact distally Intact pulses distally Dorsiflexion/Plantar flexion intact Incision: dressing C/D/I and no drainage  Assessment/Plan: 1 Day Post-Op Procedure(s) (LRB): MICRO LUMBAR BILATERAL DECOMPRESSION L4 - L5 (Bilateral) Advance diet Up with therapy D/C IV fluids Discharge home with home health  D/C instructions given  Ardel Jagger C 09/15/2015, 10:30 AM

## 2015-09-15 NOTE — Discharge Summary (Signed)
Physician Discharge Summary   Patient ID: Destiny Shaw MRN: [id number] DOB/AGE: 1950-04-16 65 y.o.  Admit date: 09/14/2015 Discharge date: 09/15/2015  Primary Diagnosis:   stenosis L4 - L5  Admission Diagnoses:  Past Medical History  Diagnosis Date  . Diabetes mellitus without complication ([place])   . Anemia   . GERD (gastroesophageal reflux disease)   . Hyperlipidemia   . IBS (irritable bowel syndrome)   . Sleep apnea   . Concussion   . ADD (attention deficit disorder)   . Hypertension   . Hypothyroidism   . Depression   . Anxiety   . Anginal pain ([place])     pt states has occas chest pain relates to indigestion; pt uses rest to relieve   . Wears glasses   . Numbness     right leg   . Numbness in both hands     comes and goes   . Dizziness   . Fall   . Imbalance   . Pneumonia     last episode approx 1 year ago   . History of urinary tract infection   . Headache   . Arthritis    Discharge Diagnoses:   Principal Problem:   Spinal stenosis of lumbar region  Procedure:  Procedure(s) (LRB): MICRO LUMBAR BILATERAL DECOMPRESSION L4 - L5 (Bilateral)   Consults: None  HPI:  see H&P    Laboratory Data: Hospital Outpatient Visit on 09/05/2015  Component Date Value Ref Range Status  . Sodium 09/05/2015 141  135 - 145 mmol/L Final  . Potassium 09/05/2015 4.4  3.5 - 5.1 mmol/L Final  . Chloride 09/05/2015 109  101 - 111 mmol/L Final  . CO2 09/05/2015 25  22 - 32 mmol/L Final  . Glucose, Bld 09/05/2015 156* 65 - 99 mg/dL Final  . BUN 09/05/2015 10  6 - 20 mg/dL Final  . Creatinine, Ser 09/05/2015 0.55  0.44 - 1.00 mg/dL Final  . Calcium 09/05/2015 9.5  8.9 - 10.3 mg/dL Final  . GFR calc non Af Amer 09/05/2015 >60  >60 mL/min Final  . GFR calc Af Amer 09/05/2015 >60  >60 mL/min Final   Comment: (NOTE) The eGFR has been calculated using the CKD EPI equation. This calculation has not been validated in all clinical situations. eGFR's persistently <60 mL/min signify  possible Chronic Kidney Disease.   . Anion gap 09/05/2015 7  5 - 15 Final  . WBC 09/05/2015 6.3  4.0 - 10.5 K/uL Final  . RBC 09/05/2015 4.60  3.87 - 5.11 MIL/uL Final  . Hemoglobin 09/05/2015 12.3  12.0 - 15.0 g/dL Final  . HCT 09/05/2015 38.1  36.0 - 46.0 % Final  . MCV 09/05/2015 82.8  78.0 - 100.0 fL Final  . MCH 09/05/2015 26.7  26.0 - 34.0 pg Final  . MCHC 09/05/2015 32.3  30.0 - 36.0 g/dL Final  . RDW 09/05/2015 14.2  11.5 - 15.5 % Final  . Platelets 09/05/2015 235  150 - 400 K/uL Final  . ABO/RH(D) 09/05/2015 O POS   Final  . Antibody Screen 09/05/2015 NEG   Final  . Sample Expiration 09/05/2015 09/17/2015   Final  . Extend sample reason 09/05/2015 NO TRANSFUSIONS OR PREGNANCY IN THE PAST 3 MONTHS   Final  . Hgb A1c MFr Bld 09/05/2015 7.3* 4.8 - 5.6 % Final   Comment: (NOTE)         Pre-diabetes: 5.7 - 6.4         Diabetes: >6.4  Glycemic control for adults with diabetes: <7.0   . Mean Plasma Glucose 09/05/2015 163   Final   Comment: (NOTE) Performed At: [facility] [place], [place] [id number] Lindon Romp MD RF:[id number]   . ABO/RH(D) 09/05/2015 O POS   Final   No results for input(s): HGB in the last 72 hours. No results for input(s): WBC, RBC, HCT, PLT in the last 72 hours.  Recent Labs  09/15/15 0516  NA 139  K 3.6  CL 106  CO2 26  BUN 9  CREATININE 0.70  GLUCOSE 146*  CALCIUM 9.1   No results for input(s): LABPT, INR in the last 72 hours.  X-Rays:Dg Lumbar Spine 2-3 Views  09/05/2015  CLINICAL DATA:  Preop spinal stenosis. Low back pain radiating into right leg. EXAM: LUMBAR SPINE - 2-3 VIEW COMPARISON:  MRI 04/25/2015 FINDINGS: Normal alignment. Mild degenerative spurring throughout the lumbar spine anteriorly and in the lower thoracic spine. Mild degenerative facet disease in the mid and lower lumbar spine. No fracture. IMPRESSION: No acute bony abnormality. Electronically Signed   By: Rolm Baptise M.D.   On:  09/05/2015 10:49   Dg Spine Portable 1 View  09/14/2015  CLINICAL DATA:  L4 and L5 laminectomies EXAM: PORTABLE SPINE - 1 VIEW COMPARISON:  Studies obtained earlier in the day FINDINGS: Cross-table lateral lumbar spine image labeled #3 submitted. Metallic probe tips overlie the inferior aspect of the L4 vertebral body and mid L5 vertebral body respectively with cutting tool located between the L4 and L5 spinous processes. No fracture or spondylolisthesis. IMPRESSION: Metallic probe tips posterior to the inferior aspect of the L4 vertebral body and the mid L5 vertebral body respectively. Cutting tool overlies portions of the L4 and L5 spinous processes, centered posterior to the L4-5 interspace level. No fracture or spondylolisthesis. Electronically Signed   By: Lowella Grip III M.D.   On: 09/14/2015 09:50   Dg Spine Portable 1 View  09/14/2015  CLINICAL DATA:  L4-L5 laminectomy EXAM: PORTABLE SPINE - 1 VIEW COMPARISON:  Study obtained earlier in the day FINDINGS: Cross-table lateral lumbar image is labeled #2. Metallic probes overlie the superior aspect of the L4 spinous process and the superior aspect of the L5 spinous process respectively with cutting tool overlying the L4 spinous process. No fracture or spondylolisthesis. IMPRESSION: Metallic probes overlie the superior aspect of the L4 spinous process and the superior aspect of the L5 spinous process with cutting tool overlying the L4 spinous process. No fracture or spondylolisthesis. Electronically Signed   By: Lowella Grip III M.D.   On: 09/14/2015 09:18   Dg Spine Portable 1 View  09/14/2015  CLINICAL DATA:  L4-L5 lumbar decompression EXAM: PORTABLE SPINE - 1 VIEW COMPARISON:  None. FINDINGS: Cross-table lateral lumbar image is labeled #1. Metallic probe tips are posterior to the superior aspect of the L4 spinous process in the mid L5 spinous process respectively. No fracture or spondylolisthesis. There are calcifications in the anterior  ligament at multiple levels. IMPRESSION: Metallic probe tips are posterior to the L4 and L5 spinous processes respectively. No fracture or spondylolisthesis. Electronically Signed   By: Lowella Grip III M.D.   On: 09/14/2015 09:16    EKG: Orders placed or performed in visit on 10/15/11  . EKG 12-Lead     Hospital Course: Patient was admitted to [facility] and taken to the OR and underwent the above state procedure without complications.  Patient tolerated the procedure well and was later  transferred to the recovery room and then to the orthopaedic floor for postoperative care.  They were given PO and IV analgesics for pain control following their surgery.  They were given 24 hours of postoperative antibiotics.   PT was consulted postop to assist with mobility and transfers.  The patient was allowed to be WBAT with therapy and was taught back precautions. Discharge planning was consulted to help with postop disposition and equipment needs.  Patient had a good night on the evening of surgery and started to get up OOB with therapy on day one. Patient was seen in rounds and was ready to go home on day one.  They were given discharge instructions and dressing directions.  They were instructed on when to follow up in the office with Dr. Tonita Cong.   Diet: Regular diet Activity:WBAT; Lspine precautions Follow-up:in 10-14 days Disposition - Home Discharged Condition: good   Discharge Instructions    Call MD / Call 911    Complete by:  As directed   If you experience chest pain or shortness of breath, CALL 911 and be transported to the hospital emergency room.  If you develope a fever above 101 F, pus (white drainage) or increased drainage or redness at the wound, or calf pain, call your surgeon's office.     Constipation Prevention    Complete by:  As directed   Drink plenty of fluids.  Prune juice may be helpful.  You may use a stool softener, such as Colace (over the counter) 100 mg twice  a day.  Use MiraLax (over the counter) for constipation as needed.     Diet - low sodium heart healthy    Complete by:  As directed      Increase activity slowly as tolerated    Complete by:  As directed             Medication List    STOP taking these medications        cyclobenzaprine 5 MG tablet  Commonly known as:  FLEXERIL     mupirocin ointment 2 %  Commonly known as:  BACTROBAN      TAKE these medications        acidophilus Caps capsule  Take 1 capsule by mouth every other day.     amphetamine-dextroamphetamine 10 MG tablet  Commonly known as:  ADDERALL  Take 10 mg by mouth daily as needed (for focus).     chlorhexidine 0.12 % solution  Commonly known as:  PERIDEX  Use as directed 15 mLs in the mouth or throat daily. Reported on 09/01/2015     cholecalciferol 1000 units tablet  Commonly known as:  VITAMIN D  Take 2,000 Units by mouth 2 (two) times daily.     docusate sodium 100 MG capsule  Commonly known as:  COLACE  Take 1 capsule (100 mg total) by mouth 2 (two) times daily as needed for mild constipation.     FLUoxetine 40 MG capsule  Commonly known as:  PROZAC  Take 40 mg by mouth daily.     HYDROcodone-acetaminophen 5-325 MG tablet  Commonly known as:  NORCO/VICODIN  Take 1-2 tablets by mouth every 4 (four) hours as needed for severe pain.     losartan 50 MG tablet  Commonly known as:  COZAAR  Take 50 mg by mouth daily.     metFORMIN 500 MG tablet  Commonly known as:  GLUCOPHAGE  Take 1,000 mg by mouth 2 (two) times daily.  polyethylene glycol packet  Commonly known as:  MIRALAX / GLYCOLAX  Take 17 g by mouth daily.     propranolol 20 MG tablet  Commonly known as:  INDERAL  Take 20 mg by mouth 2 (two) times daily. Reported on 09/01/2015     senna-docusate 8.6-50 MG tablet  Commonly known as:  Senokot-S  Take 2-4 tablets by mouth daily.     topiramate 25 MG tablet  Commonly known as:  TOPAMAX  Take 25 mg by mouth 2 (two) times daily.      traMADol 50 MG tablet  Commonly known as:  ULTRAM  Take 50 mg by mouth every 6 (six) hours as needed for moderate pain.     zolpidem 10 MG tablet  Commonly known as:  AMBIEN  Take 10 mg by mouth at bedtime as needed for sleep.           Follow-up Information    Follow up with BEANE,JEFFREY C, MD In 2 weeks.   Specialty:  Orthopedic Surgery   Contact information:   [address] [place] [address] [phone]       Signed: Lacie Draft, PA-C Orthopaedic Surgery 09/15/2015, 10:33 AM

## 2015-09-15 NOTE — Evaluation (Addendum)
Physical Therapy Evaluation Patient Details Name: Destiny Shaw MRN: [id number] DOB: Sep 07, 1950 Today's Date: 09/15/2015   History of Present Illness  s/p MICRO LUMBAR BILATERAL DECOMPRESSION L4 - L5 (Bilateral)  Clinical Impression  Pt s/p L4-5 decompression presents with functional mobility limitations 2* post op back pain and precautions, and mild ambulatory balance deficits.  Pt plans dc home with family assist.    Follow Up Recommendations No PT follow up    Equipment Recommendations  Rolling walker with 5" wheels    Recommendations for Other Services OT consult     Precautions / Restrictions Precautions Precautions: Back Precaution Booklet Issued: Yes (comment) Precaution Comments: back precaution handout given to patient and reviewed with patient Restrictions Weight Bearing Restrictions: No      Mobility  Bed Mobility Overal bed mobility: Needs Assistance Bed Mobility: Supine to Sit;Sit to Supine Rolling: Supervision Sidelying to sit: Supervision Supine to sit: Supervision Sit to supine: Supervision   General bed mobility comments: cues for log roll technique  Transfers Overall transfer level: Needs assistance Equipment used: None Transfers: Sit to/from Stand Sit to Stand: Supervision         General transfer comment: cues for adherence to back precautions and use of UEs to self assist  Ambulation/Gait Ambulation/Gait assistance: Min guard;Supervision Ambulation Distance (Feet): 400 Feet Assistive device: Rolling walker (2 wheeled);None Gait Pattern/deviations: Step-through pattern;Decreased step length - right;Decreased step length - left;Shuffle;Trunk flexed;Wide base of support Gait velocity: decr Gait velocity interpretation: Below normal speed for age/gender General Gait Details: 350' with RW and min guard to sup, min cues for posture and position from RW.  Additional 51' sans assist device with min assist and noted deterioration in  stability  Stairs  Pt up/down 4 stairs with cues for sequence and use of RW to assist with ascent and spouse to assist with descent.          Wheelchair Mobility    Modified Rankin (Stroke Patients Only)       Balance                                             Pertinent Vitals/Pain Pain Assessment: 0-10 Pain Score: 4  Pain Location: back, Bil hips/legs Pain Descriptors / Indicators: Aching Pain Intervention(s): Limited activity within patient's tolerance;Monitored during session;Premedicated before session;Ice applied    Home Living Family/patient expects to be discharged to:: Private residence Living Arrangements: Spouse/significant other Available Help at Discharge: Family Type of Home: House Home Access: Stairs to enter Entrance Stairs-Rails: Left Entrance Stairs-Number of Steps: 3-5 Home Layout: One level Home Equipment: None      Prior Function Level of Independence: Independent               Hand Dominance   Dominant Hand: Right    Extremity/Trunk Assessment   Upper Extremity Assessment: Overall WFL for tasks assessed           Lower Extremity Assessment: Overall WFL for tasks assessed         Communication   Communication: No difficulties  Cognition Arousal/Alertness: Awake/alert Behavior During Therapy: WFL for tasks assessed/performed Overall Cognitive Status: Within Functional Limits for tasks assessed                      General Comments      Exercises  Assessment/Plan    PT Assessment Patient needs continued PT services  PT Diagnosis Difficulty walking   PT Problem List Decreased strength;Decreased activity tolerance;Decreased balance;Decreased mobility;Pain;Decreased knowledge of use of DME;Decreased safety awareness;Obesity;Decreased knowledge of precautions  PT Treatment Interventions DME instruction;Gait training;Stair training;Functional mobility training;Therapeutic  activities;Therapeutic exercise;Patient/family education   PT Goals (Current goals can be found in the Care Plan section) Acute Rehab PT Goals Patient Stated Goal: decreased pain    Frequency 7X/week   Barriers to discharge        Co-evaluation               End of Session   Activity Tolerance: Patient tolerated treatment well Patient left: in chair;with call bell/phone within reach;with family/visitor present Nurse Communication: Mobility status         Time: [id number] PT Time Calculation (min) (ACUTE ONLY): 43 min   Charges:   PT Evaluation $PT Eval Low Complexity: 1 Procedure PT Treatments $Gait Training: 8-22 mins   PT G Codes:        Destiny Shaw 10/13/15, 12:30 PM

## 2015-11-04 ENCOUNTER — Ambulatory Visit: Payer: Medicare Other | Admitting: Dietician

## 2015-12-02 ENCOUNTER — Encounter: Payer: Self-pay | Admitting: Dietician

## 2016-04-30 ENCOUNTER — Other Ambulatory Visit: Payer: Self-pay | Admitting: Nurse Practitioner

## 2016-04-30 DIAGNOSIS — R413 Other amnesia: Secondary | ICD-10-CM

## 2016-05-14 ENCOUNTER — Ambulatory Visit
Admission: RE | Admit: 2016-05-14 | Discharge: 2016-05-14 | Disposition: A | Discharge status: Home / Self Care | Payer: Medicare Other | Source: Ambulatory Visit | Attending: Nurse Practitioner | Admitting: Nurse Practitioner

## 2016-05-14 DIAGNOSIS — R9082 White matter disease, unspecified: Secondary | ICD-10-CM | POA: Insufficient documentation

## 2016-05-14 DIAGNOSIS — R413 Other amnesia: Secondary | ICD-10-CM | POA: Diagnosis not present

## 2016-05-14 MED ORDER — GADOBENATE DIMEGLUMINE 529 MG/ML IV SOLN
20.0000 mL | Freq: Once | INTRAVENOUS | Status: AC | PRN
  Administered 2016-05-14: 17 mL via INTRAVENOUS

## 2016-05-31 ENCOUNTER — Other Ambulatory Visit: Payer: Self-pay | Admitting: Orthopedic Surgery

## 2016-05-31 ENCOUNTER — Ambulatory Visit: Payer: Medicare Other | Attending: Neurology

## 2016-05-31 DIAGNOSIS — G4761 Periodic limb movement disorder: Secondary | ICD-10-CM | POA: Insufficient documentation

## 2016-05-31 DIAGNOSIS — G4733 Obstructive sleep apnea (adult) (pediatric): Secondary | ICD-10-CM | POA: Insufficient documentation

## 2016-05-31 DIAGNOSIS — Z9079 Acquired absence of other genital organ(s): Secondary | ICD-10-CM | POA: Insufficient documentation

## 2016-06-12 NOTE — Progress Notes (Signed)
06-07-16 Surgical clearance from Mortimer Fries, PA on chart 06-07-16 EKG on chart 06-07-16 Office Visit on chart

## 2016-06-13 NOTE — H&P (Signed)
TOTAL KNEE ADMISSION H&P  Patient is being admitted for left total knee arthroplasty.  Subjective:  Chief Complaint:     Left knee primary OA / pain  HPI: Destiny Shaw, 66 y.o. female, has a history of pain and functional disability in the left knee due to arthritis and has failed non-surgical conservative treatments for greater than 12 weeks to include NSAID's and/or analgesics, corticosteriod injections and activity modification.  Onset of symptoms was gradual, starting >10 years ago with gradually worsening course since that time. The patient noted prior procedures on the knee to include  arthroscopy and menisectomy on the left knee(s).  Patient currently rates pain in the left knee(s) at 8 out of 10 with activity. Patient has night pain, worsening of pain with activity and weight bearing, pain that interferes with activities of daily living, pain with passive range of motion, crepitus and joint swelling.  Patient has evidence of periarticular osteophytes and joint space narrowing by imaging studies.  There is no active infection.   Risks, benefits and expectations were discussed with the patient.  Risks including but not limited to the risk of anesthesia, blood clots, nerve damage, blood vessel damage, failure of the prosthesis, infection and up to and including death.  Patient understand the risks, benefits and expectations and wishes to proceed with surgery.   PCP: Kirk Ruths., MD  D/C Plans:       Home   Post-op Meds:       No Rx given  Tranexamic Acid:      To be given - IV   Decadron:      Is to be given  FYI:     ASA  [place]  DME:   Pt already has equipment  PT:   OPPT @ [facility]   Patient Active Problem List   Diagnosis Date Noted  . Spinal stenosis of lumbar region 09/14/2015   Past Medical History:  Diagnosis Date  . ADD (attention deficit disorder)   . Anemia   . Anginal pain ([place])    pt states has occas chest pain relates to indigestion; pt uses rest to relieve    . Anxiety   . Arthritis   . Concussion   . Depression   . Diabetes mellitus without complication ([place])   . Dizziness   . Fall   . GERD (gastroesophageal reflux disease)   . Headache   . History of urinary tract infection   . Hyperlipidemia   . Hypertension   . Hypothyroidism   . IBS (irritable bowel syndrome)   . Imbalance   . Numbness    right leg   . Numbness in both hands    comes and goes   . Pneumonia    last episode approx 1 year ago   . Sleep apnea   . Wears glasses     Past Surgical History:  Procedure Laterality Date  . BREAST LUMPECTOMY Right   . BREAST SURGERY    . CARPAL TUNNEL RELEASE    . CESAREAN SECTION     times 2  . COLONOSCOPY WITH PROPOFOL N/A 05/11/2015   Procedure: COLONOSCOPY WITH PROPOFOL;  Surgeon: Manya Silvas, MD;  Location: [facility] ENDOSCOPY;  Service: Endoscopy;  Laterality: N/A;  . DE QUERVAIN'S RELEASE Right   . ESOPHAGOGASTRODUODENOSCOPY (EGD) WITH PROPOFOL N/A 05/11/2015   Procedure: ESOPHAGOGASTRODUODENOSCOPY (EGD) WITH PROPOFOL;  Surgeon: Manya Silvas, MD;  Location: [facility] ENDOSCOPY;  Service: Endoscopy;  Laterality: N/A;  . EYE SURGERY  laser surgery bilat   . HERNIA REPAIR    . KNEE ARTHROSCOPY    . LUMBAR LAMINECTOMY/DECOMPRESSION MICRODISCECTOMY Bilateral 09/14/2015   Procedure: MICRO LUMBAR BILATERAL DECOMPRESSION L4 - L5;  Surgeon: Susa Day, MD;  Location: [facility] ORS;  Service: Orthopedics;  Laterality: Bilateral;  . RHINOPLASTY    . TONSILLECTOMY    . TUBAL LIGATION    . UVULOPALATOPHARYNGOPLASTY      No prescriptions prior to admission.   Allergies  Allergen Reactions  . Tessalon [Benzonatate] Hives  . Latex Rash    Social History  Substance Use Topics  . Smoking status: Never Smoker  . Smokeless tobacco: Never Used  . Alcohol use No       Review of Systems  Constitutional: Negative.   HENT: Negative.   Eyes: Negative.   Respiratory: Negative.   Cardiovascular: Negative.   Gastrointestinal: Positive  for constipation and heartburn.  Genitourinary: Negative.   Musculoskeletal: Positive for joint pain.  Skin: Negative.   Neurological: Positive for headaches.  Endo/Heme/Allergies: Negative.   Psychiatric/Behavioral: Positive for depression. The patient is nervous/anxious.     Objective:  Physical Exam  Constitutional: She is oriented to person, place, and time. She appears well-developed.  HENT:  Head: Normocephalic.  Eyes: Pupils are equal, round, and reactive to light.  Neck: Neck supple. No JVD present. No tracheal deviation present. No thyromegaly present.  Cardiovascular: Normal rate, regular rhythm and intact distal pulses.   Respiratory: Effort normal and breath sounds normal. No respiratory distress. She has no wheezes.  GI: Soft. There is no tenderness. There is no guarding.  Musculoskeletal:       Left knee: She exhibits decreased range of motion, swelling and bony tenderness. She exhibits no ecchymosis, no deformity, no laceration and no erythema. Tenderness found.  Lymphadenopathy:    She has no cervical adenopathy.  Neurological: She is alert and oriented to person, place, and time. A sensory deficit (bilateral DM neuropathy) is present.  Skin: Skin is warm and dry.  Psychiatric: She has a normal mood and affect.      Labs:  Estimated body mass index is 31.41 kg/m as calculated from the following:   Height as of 09/14/15: 5\' 4"  (1.626 m).   Weight as of 09/14/15: 83 kg (183 lb).   Imaging Review Plain radiographs demonstrate severe degenerative joint disease of the left knee(s).  The bone quality appears to be good for age and reported activity level.  Assessment/Plan:  End stage arthritis, left knee   The patient history, physical examination, clinical judgment of the provider and imaging studies are consistent with end stage degenerative joint disease of the left knee(s) and total knee arthroplasty is deemed medically necessary. The treatment options  including medical management, injection therapy arthroscopy and arthroplasty were discussed at length. The risks and benefits of total knee arthroplasty were presented and reviewed. The risks due to aseptic loosening, infection, stiffness, patella tracking problems, thromboembolic complications and other imponderables were discussed. The patient acknowledged the explanation, agreed to proceed with the plan and consent was signed. Patient is being admitted for inpatient treatment for surgery, pain control, PT, OT, prophylactic antibiotics, VTE prophylaxis, progressive ambulation and ADL's and discharge planning. The patient is planning to be discharged home.      [place] P[place] Vertis Bauder   PA-C  06/13/2016, 5:31 PM

## 2016-06-13 NOTE — Patient Instructions (Addendum)
Destiny Shaw  06/13/2016   Your procedure is scheduled on: 06-18-16  Report to [facility] Main Entrance Report to admitting at 0700 AM  Call this number if you have problems the morning of surgery [phone]   Remember: ONLY 1 PERSON MAY GO WITH YOU TO SHORT STAY TO GET  READY MORNING OF [place].  Do not eat food or drink liquids :After Midnight.     Take these medicines the morning of surgery with A SIP OF WATER: Propanolol (Inderal), Fluoxetine (Prozac)  DO NOT TAKE ANY DIABETIC MEDICATIONS DAY OF YOUR SURGERY                               You may not have any metal on your body including hair pins and              piercings  Do not wear jewelry, make-up, lotions, powders or perfumes, deodorant             Do not wear nail polish.  Do not shave  48 hours prior to surgery.               Do not bring valuables to the hospital. [place].  Contacts, dentures or bridgework may not be worn into surgery.  Leave suitcase in the car. After surgery it may be brought to your room.   Special Instructions: Please bring your tubing and mask for your CPAP machine               Please read over the following fact sheets you were given: _____________________________________________________________________  How to Manage Your Diabetes Before and After Surgery  Why is it important to control my blood sugar before and after surgery? . Improving blood sugar levels before and after surgery helps healing and can limit problems. . A way of improving blood sugar control is eating a healthy diet by: o  Eating less sugar and carbohydrates o  Increasing activity/exercise o  Talking with your doctor about reaching your blood sugar goals . High blood sugars (greater than 180 mg/dL) can raise your risk of infections and slow your recovery, so you will need to focus on controlling your diabetes during the weeks before  surgery. . Make sure that the doctor who takes care of your diabetes knows about your planned surgery including the date and location.  How do I manage my blood sugar before surgery? . Check your blood sugar at least 4 times a day, starting 2 days before surgery, to make sure that the level is not too high or low. o Check your blood sugar the morning of your surgery when you wake up and every 2 hours until you get to the Short Stay unit. . If your blood sugar is less than 70 mg/dL, you will need to treat for low blood sugar: o Do not take insulin. o Treat a low blood sugar (less than 70 mg/dL) with  cup of clear juice (cranberry or apple), 4 glucose tablets, OR glucose gel. o Recheck blood sugar in 15 minutes after treatment (to make sure it is greater than 70 mg/dL). If your blood sugar is not greater than 70 mg/dL on recheck, call [phone] for further instructions. Marland Kitchen  Report your blood sugar to the short stay nurse when you get to Short Stay.  . If you are admitted to the hospital after surgery: o Your blood sugar will be checked by the staff and you will probably be given insulin after surgery (instead of oral diabetes medicines) to make sure you have good blood sugar levels. o The goal for blood sugar control after surgery is 80-180 mg/dL.   WHAT DO I DO ABOUT MY DIABETES MEDICATION?  Marland Kitchen Do not take oral diabetes medicines (pills) the morning of surgery.      Patient Signature:  Date:   Nurse Signature:  Date:   Reviewed and Endorsed by [facility] Patient Education Committee, August 2015[facility] - Preparing for Surgery Before surgery, you can play an important role.  Because skin is not sterile, your skin needs to be as free of germs as possible.  You can reduce the number of germs on your skin by washing with CHG (chlorahexidine gluconate) soap before surgery.  CHG is an antiseptic cleaner which kills germs and bonds with the skin to continue killing germs even after  washing. Please DO NOT use if you have an allergy to CHG or antibacterial soaps.  If your skin becomes reddened/irritated stop using the CHG and inform your nurse when you arrive at Short Stay. Do not shave (including legs and underarms) for at least 48 hours prior to the first CHG shower.  You may shave your face/neck. Please follow these instructions carefully:  1.  Shower with CHG Soap the night before surgery and the  morning of Surgery.  2.  If you choose to wash your hair, wash your hair first as usual with your  normal  shampoo.  3.  After you shampoo, rinse your hair and body thoroughly to remove the  shampoo.                           4.  Use CHG as you would any other liquid soap.  You can apply chg directly  to the skin and wash                       Gently with a scrungie or clean washcloth.  5.  Apply the CHG Soap to your body ONLY FROM THE NECK DOWN.   Do not use on face/ open                           Wound or open sores. Avoid contact with eyes, ears mouth and genitals (private parts).                       Wash face,  Genitals (private parts) with your normal soap.             6.  Wash thoroughly, paying special attention to the area where your surgery  will be performed.  7.  Thoroughly rinse your body with warm water from the neck down.  8.  DO NOT shower/wash with your normal soap after using and rinsing off  the CHG Soap.                9.  Pat yourself dry with a clean towel.            10.  Wear clean pajamas.            11.  Place clean sheets on your bed the night of your first shower and do not  sleep with pets. Day of Surgery : Do not apply any lotions/deodorants the morning of surgery.  Please wear clean clothes to the hospital/surgery center.  FAILURE TO FOLLOW THESE INSTRUCTIONS MAY RESULT IN THE CANCELLATION OF YOUR SURGERY PATIENT SIGNATURE_________________________________  NURSE  SIGNATURE__________________________________  ________________________________________________________________________   Destiny Shaw  An incentive spirometer is a tool that can help keep your lungs clear and active. This tool measures how well you are filling your lungs with each breath. Taking long deep breaths may help reverse or decrease the chance of developing breathing (pulmonary) problems (especially infection) following:  A long period of time when you are unable to move or be active. BEFORE THE PROCEDURE   If the spirometer includes an indicator to show your best effort, your nurse or respiratory therapist will set it to a desired goal.  If possible, sit up straight or lean slightly forward. Try not to slouch.  Hold the incentive spirometer in an upright position. INSTRUCTIONS FOR USE  1. Sit on the edge of your bed if possible, or sit up as far as you can in bed or on a chair. 2. Hold the incentive spirometer in an upright position. 3. Breathe out normally. 4. Place the mouthpiece in your mouth and seal your lips tightly around it. 5. Breathe in slowly and as deeply as possible, raising the piston or the ball toward the top of the column. 6. Hold your breath for 3-5 seconds or for as long as possible. Allow the piston or ball to fall to the bottom of the column. 7. Remove the mouthpiece from your mouth and breathe out normally. 8. Rest for a few seconds and repeat Steps 1 through 7 at least 10 times every 1-2 hours when you are awake. Take your time and take a few normal breaths between deep breaths. 9. The spirometer may include an indicator to show your best effort. Use the indicator as a goal to work toward during each repetition. 10. After each set of 10 deep breaths, practice coughing to be sure your lungs are clear. If you have an incision (the cut made at the time of surgery), support your incision when coughing by placing a pillow or rolled up towels firmly  against it. Once you are able to get out of bed, walk around indoors and cough well. You may stop using the incentive spirometer when instructed by your caregiver.  RISKS AND COMPLICATIONS  Take your time so you do not get dizzy or light-headed.  If you are in pain, you may need to take or ask for pain medication before doing incentive spirometry. It is harder to take a deep breath if you are having pain. AFTER USE  Rest and breathe slowly and easily.  It can be helpful to keep track of a log of your progress. Your caregiver can provide you with a simple table to help with this. If you are using the spirometer at home, follow these instructions: [place] IF:   You are having difficultly using the spirometer.  You have trouble using the spirometer as often as instructed.  Your pain medication is not giving enough relief while using the spirometer.  You develop fever of 100.5 F (38.1 C) or higher. SEEK IMMEDIATE MEDICAL CARE IF:   You cough up bloody sputum that had not been present before.  You develop fever of 102 F (38.9 C) or greater.  You develop worsening pain at or near the incision site. MAKE SURE YOU:   Understand these instructions.  Will watch your condition.  Will get help right away if you are not doing well or get worse. Document Released: 06/25/2006 Document Revised: 05/07/2011 Document Reviewed: 08/26/2006 Exit[employer] Patient Information 2014 [employer], [place].   ________________________________________________________________________  WHAT IS A BLOOD TRANSFUSION? Blood Transfusion Information  A transfusion is the replacement of blood or some of its parts. Blood is made up of multiple cells which provide different functions.  Red blood cells carry oxygen and are used for blood loss replacement.  White blood cells fight against infection.  Platelets control bleeding.  Plasma helps clot blood.  Other blood products are available for  specialized needs, such as hemophilia or other clotting disorders. BEFORE THE TRANSFUSION  Who gives blood for transfusions?   Healthy volunteers who are fully evaluated to make sure their blood is safe. This is blood bank blood. Transfusion therapy is the safest it has ever been in the practice of medicine. Before blood is taken from a donor, a complete history is taken to make sure that person has no history of diseases nor engages in risky social behavior (examples are intravenous drug use or sexual activity with multiple partners). The donor's travel history is screened to minimize risk of transmitting infections, such as malaria. The donated blood is tested for signs of infectious diseases, such as HIV and hepatitis. The blood is then tested to be sure it is compatible with you in order to minimize the chance of a transfusion reaction. If you or a relative donates blood, this is often done in anticipation of surgery and is not appropriate for emergency situations. It takes many days to process the donated blood. RISKS AND COMPLICATIONS Although transfusion therapy is very safe and saves many lives, the main dangers of transfusion include:   Getting an infectious disease.  Developing a transfusion reaction. This is an allergic reaction to something in the blood you were given. Every precaution is taken to prevent this. The decision to have a blood transfusion has been considered carefully by your caregiver before blood is given. Blood is not given unless the benefits outweigh the risks. AFTER THE TRANSFUSION  Right after receiving a blood transfusion, you will usually feel much better and more energetic. This is especially true if your red blood cells have gotten low (anemic). The transfusion raises the level of the red blood cells which carry oxygen, and this usually causes an energy increase.  The nurse administering the transfusion will monitor you carefully for complications. HOME CARE  INSTRUCTIONS  No special instructions are needed after a transfusion. You may find your energy is better. Speak with your caregiver about any limitations on activity for underlying diseases you may have. SEEK MEDICAL CARE IF:   Your condition is not improving after your transfusion.  You develop redness or irritation at the intravenous (IV) site. SEEK IMMEDIATE MEDICAL CARE IF:  Any of the following symptoms occur over the next 12 hours:  Shaking chills.  You have a temperature by mouth above 102 F (38.9 C), not controlled by medicine.  Chest, back, or muscle pain.  People around you feel you are not acting correctly or are confused.  Shortness of breath or difficulty breathing.  Dizziness and fainting.  You get a rash or develop hives.  You have a decrease in urine output.  Your urine turns a dark color or changes to pink, red, or brown. Any  of the following symptoms occur over the next 10 days:  You have a temperature by mouth above 102 F (38.9 C), not controlled by medicine.  Shortness of breath.  Weakness after normal activity.  The white part of the eye turns yellow (jaundice).  You have a decrease in the amount of urine or are urinating less often.  Your urine turns a dark color or changes to pink, red, or brown. Document Released: 02/10/2000 Document Revised: 05/07/2011 Document Reviewed: 09/29/2007 [facility] Patient Information 2014 [place], [place].  _______________________________________________________________________

## 2016-06-14 ENCOUNTER — Encounter (HOSPITAL_COMMUNITY): Payer: Self-pay

## 2016-06-14 ENCOUNTER — Encounter (HOSPITAL_COMMUNITY)
Admission: RE | Admit: 2016-06-14 | Discharge: 2016-06-14 | Disposition: A | Discharge status: Home / Self Care | Payer: Medicare Other | Source: Ambulatory Visit | Attending: Orthopedic Surgery | Admitting: Orthopedic Surgery

## 2016-06-14 DIAGNOSIS — M1712 Unilateral primary osteoarthritis, left knee: Secondary | ICD-10-CM | POA: Diagnosis not present

## 2016-06-14 DIAGNOSIS — D649 Anemia, unspecified: Secondary | ICD-10-CM | POA: Insufficient documentation

## 2016-06-14 DIAGNOSIS — Z01812 Encounter for preprocedural laboratory examination: Secondary | ICD-10-CM | POA: Insufficient documentation

## 2016-06-14 DIAGNOSIS — E119 Type 2 diabetes mellitus without complications: Secondary | ICD-10-CM | POA: Diagnosis not present

## 2016-06-14 LAB — CBC
HCT: 37.2 % (ref 36.0–46.0)
HEMOGLOBIN: 12.1 g/dL (ref 12.0–15.0)
MCH: 26.8 pg (ref 26.0–34.0)
MCHC: 32.5 g/dL (ref 30.0–36.0)
MCV: 82.3 fL (ref 78.0–100.0)
PLATELETS: 217 10*3/uL (ref 150–400)
RBC: 4.52 MIL/uL (ref 3.87–5.11)
RDW: 14.3 % (ref 11.5–15.5)
WBC: 7.6 10*3/uL (ref 4.0–10.5)

## 2016-06-14 LAB — BASIC METABOLIC PANEL
ANION GAP: 10 (ref 5–15)
BUN: 13 mg/dL (ref 6–20)
CALCIUM: 9.5 mg/dL (ref 8.9–10.3)
CO2: 23 mmol/L (ref 22–32)
CREATININE: 0.59 mg/dL (ref 0.44–1.00)
Chloride: 107 mmol/L (ref 101–111)
Glucose, Bld: 122 mg/dL — ABNORMAL HIGH (ref 65–99)
Potassium: 4.3 mmol/L (ref 3.5–5.1)
SODIUM: 140 mmol/L (ref 135–145)

## 2016-06-14 LAB — SURGICAL PCR SCREEN
MRSA, PCR: NEGATIVE
Staphylococcus aureus: NEGATIVE

## 2016-06-14 LAB — GLUCOSE, CAPILLARY: Glucose-Capillary: 121 mg/dL — ABNORMAL HIGH (ref 65–99)

## 2016-06-14 NOTE — Progress Notes (Signed)
Pt indicated that she has OSA and has a CPAP machine. She also stated that she had recently had a procedure done to correct this and awaiting results to see if procedure was effective.  Pt would like to bring tubing and mask to procedure, as this will make her more comfortable.

## 2016-06-15 LAB — HEMOGLOBIN A1C
Hgb A1c MFr Bld: 7.4 % — ABNORMAL HIGH (ref 4.8–5.6)
Mean Plasma Glucose: 166 mg/dL

## 2016-06-18 ENCOUNTER — Encounter (HOSPITAL_COMMUNITY)
Admission: RE | Disposition: A | Discharge status: Home / Self Care | Payer: Self-pay | Source: Ambulatory Visit | Attending: Orthopedic Surgery

## 2016-06-18 ENCOUNTER — Inpatient Hospital Stay (HOSPITAL_COMMUNITY)
Admission: RE | Admit: 2016-06-18 | Discharge: 2016-06-21 | DRG: 470 | Disposition: A | Discharge status: Home / Self Care | Payer: Medicare Other | Source: Ambulatory Visit | Attending: Orthopedic Surgery | Admitting: Orthopedic Surgery

## 2016-06-18 ENCOUNTER — Inpatient Hospital Stay (HOSPITAL_COMMUNITY): Payer: Medicare Other | Admitting: Registered Nurse

## 2016-06-18 ENCOUNTER — Encounter (HOSPITAL_COMMUNITY): Payer: Self-pay | Admitting: *Deleted

## 2016-06-18 DIAGNOSIS — E785 Hyperlipidemia, unspecified: Secondary | ICD-10-CM | POA: Diagnosis present

## 2016-06-18 DIAGNOSIS — K219 Gastro-esophageal reflux disease without esophagitis: Secondary | ICD-10-CM | POA: Diagnosis present

## 2016-06-18 DIAGNOSIS — F988 Other specified behavioral and emotional disorders with onset usually occurring in childhood and adolescence: Secondary | ICD-10-CM | POA: Diagnosis present

## 2016-06-18 DIAGNOSIS — Z96652 Presence of left artificial knee joint: Secondary | ICD-10-CM

## 2016-06-18 DIAGNOSIS — Z96659 Presence of unspecified artificial knee joint: Secondary | ICD-10-CM

## 2016-06-18 DIAGNOSIS — Z6831 Body mass index (BMI) 31.0-31.9, adult: Secondary | ICD-10-CM

## 2016-06-18 DIAGNOSIS — M1712 Unilateral primary osteoarthritis, left knee: Principal | ICD-10-CM | POA: Diagnosis present

## 2016-06-18 DIAGNOSIS — E119 Type 2 diabetes mellitus without complications: Secondary | ICD-10-CM | POA: Diagnosis present

## 2016-06-18 DIAGNOSIS — I10 Essential (primary) hypertension: Secondary | ICD-10-CM | POA: Diagnosis present

## 2016-06-18 DIAGNOSIS — E039 Hypothyroidism, unspecified: Secondary | ICD-10-CM | POA: Diagnosis present

## 2016-06-18 DIAGNOSIS — E669 Obesity, unspecified: Secondary | ICD-10-CM | POA: Diagnosis present

## 2016-06-18 DIAGNOSIS — G4730 Sleep apnea, unspecified: Secondary | ICD-10-CM | POA: Diagnosis present

## 2016-06-18 DIAGNOSIS — F329 Major depressive disorder, single episode, unspecified: Secondary | ICD-10-CM | POA: Diagnosis present

## 2016-06-18 DIAGNOSIS — F419 Anxiety disorder, unspecified: Secondary | ICD-10-CM | POA: Diagnosis present

## 2016-06-18 HISTORY — PX: TOTAL KNEE ARTHROPLASTY: SHX125

## 2016-06-18 LAB — GLUCOSE, CAPILLARY
GLUCOSE-CAPILLARY: 152 mg/dL — AB (ref 65–99)
Glucose-Capillary: 226 mg/dL — ABNORMAL HIGH (ref 65–99)
Glucose-Capillary: 161 mg/dL — ABNORMAL HIGH (ref 65–99)

## 2016-06-18 SURGERY — ARTHROPLASTY, KNEE, TOTAL
Anesthesia: Spinal | Site: Knee | Laterality: Left

## 2016-06-18 MED ORDER — ONDANSETRON HCL 4 MG/2ML IJ SOLN
INTRAMUSCULAR | Status: DC | PRN
  Administered 2016-06-18: 4 mg via INTRAVENOUS

## 2016-06-18 MED ORDER — MIDAZOLAM HCL 2 MG/2ML IJ SOLN
INTRAMUSCULAR | Status: AC
Start: 2016-06-18 — End: 2016-06-18
  Administered 2016-06-18: 1 mg
  Filled 2016-06-18: qty 2

## 2016-06-18 MED ORDER — FERROUS SULFATE 325 (65 FE) MG PO TABS: 325.0000 mg | ORAL_TABLET | Freq: Three times a day (TID) | ORAL | Status: DC

## 2016-06-18 MED ORDER — POLYETHYLENE GLYCOL 3350 17 G PO PACK
17.0000 g | PACK | Freq: Two times a day (BID) | ORAL | Status: DC
  Administered 2016-06-19: 10:00:00 17 g via ORAL
  Filled 2016-06-18 (×2): qty 1

## 2016-06-18 MED ORDER — FLUOXETINE HCL 10 MG PO CAPS
10.0000 mg | ORAL_CAPSULE | Freq: Every day | ORAL | Status: DC
  Administered 2016-06-19 – 2016-06-21 (×2): 10 mg via ORAL
  Filled 2016-06-18 (×4): qty 1

## 2016-06-18 MED ORDER — KETOROLAC TROMETHAMINE 30 MG/ML IJ SOLN
INTRAMUSCULAR | Status: DC | PRN
  Administered 2016-06-18: 30 mg via INTRAVENOUS

## 2016-06-18 MED ORDER — FERROUS SULFATE 325 (65 FE) MG PO TABS
325.0000 mg | ORAL_TABLET | Freq: Three times a day (TID) | ORAL | Status: DC
  Administered 2016-06-20: 08:00:00 325 mg via ORAL
  Filled 2016-06-18: qty 1

## 2016-06-18 MED ORDER — ASPIRIN 81 MG PO CHEW: 81.0000 mg | CHEWABLE_TABLET | Freq: Two times a day (BID) | ORAL | 0 refills | Status: AC

## 2016-06-18 MED ORDER — METOCLOPRAMIDE HCL 5 MG PO TABS
5.0000 mg | ORAL_TABLET | Freq: Three times a day (TID) | ORAL | Status: DC | PRN
  Administered 2016-06-20: 08:00:00 5 mg via ORAL
  Filled 2016-06-18: qty 1

## 2016-06-18 MED ORDER — STERILE WATER FOR IRRIGATION IR SOLN
Status: DC | PRN
  Administered 2016-06-18: 2000 mL

## 2016-06-18 MED ORDER — 0.9 % SODIUM CHLORIDE (POUR BTL) OPTIME
TOPICAL | Status: DC | PRN
  Administered 2016-06-18: 1000 mL

## 2016-06-18 MED ORDER — METOCLOPRAMIDE HCL 5 MG/ML IJ SOLN: 5.0000 mg | Freq: Three times a day (TID) | INTRAMUSCULAR | Status: DC | PRN

## 2016-06-18 MED ORDER — DOCUSATE SODIUM 100 MG PO CAPS: 100.0000 mg | ORAL_CAPSULE | Freq: Two times a day (BID) | ORAL | 0 refills | Status: DC

## 2016-06-18 MED ORDER — BUPIVACAINE HCL (PF) 0.25 % IJ SOLN
INTRAMUSCULAR | Status: AC
  Filled 2016-06-18: qty 30

## 2016-06-18 MED ORDER — LIDOCAINE 2% (20 MG/ML) 5 ML SYRINGE
INTRAMUSCULAR | Status: AC
  Filled 2016-06-18: qty 5

## 2016-06-18 MED ORDER — ZOLPIDEM TARTRATE 5 MG PO TABS: 5.0000 mg | ORAL_TABLET | Freq: Every evening | ORAL | Status: DC | PRN

## 2016-06-18 MED ORDER — PROPOFOL 500 MG/50ML IV EMUL
INTRAVENOUS | Status: DC | PRN
  Administered 2016-06-18: 50 ug/kg/min via INTRAVENOUS

## 2016-06-18 MED ORDER — FENTANYL CITRATE (PF) 100 MCG/2ML IJ SOLN
50.0000 ug | INTRAMUSCULAR | Status: DC | PRN
  Administered 2016-06-18: 50 ug via INTRAVENOUS

## 2016-06-18 MED ORDER — HYDROCODONE-ACETAMINOPHEN 7.5-325 MG PO TABS
1.0000 | ORAL_TABLET | ORAL | Status: DC
  Administered 2016-06-18: 1 via ORAL
  Administered 2016-06-18 – 2016-06-19 (×3): 2 via ORAL
  Administered 2016-06-19: 07:00:00 1 via ORAL
  Administered 2016-06-19 (×3): 2 via ORAL
  Administered 2016-06-20 (×2): 1 via ORAL
  Administered 2016-06-20 – 2016-06-21 (×8): 2 via ORAL
  Filled 2016-06-18: qty 2
  Filled 2016-06-18: qty 1
  Filled 2016-06-18 (×4): qty 2
  Filled 2016-06-18: qty 1
  Filled 2016-06-18 (×10): qty 2

## 2016-06-18 MED ORDER — PROPOFOL 10 MG/ML IV BOLUS
INTRAVENOUS | Status: AC
  Filled 2016-06-18: qty 40

## 2016-06-18 MED ORDER — FENTANYL CITRATE (PF) 100 MCG/2ML IJ SOLN
INTRAMUSCULAR | Status: AC
  Administered 2016-06-18: 50 ug via INTRAVENOUS
  Filled 2016-06-18: qty 2

## 2016-06-18 MED ORDER — POLYETHYLENE GLYCOL 3350 17 G PO PACK: 17.0000 g | PACK | Freq: Two times a day (BID) | ORAL | 0 refills | Status: DC

## 2016-06-18 MED ORDER — PROPRANOLOL HCL 10 MG PO TABS
10.0000 mg | ORAL_TABLET | Freq: Two times a day (BID) | ORAL | Status: DC | PRN
  Filled 2016-06-18: qty 1

## 2016-06-18 MED ORDER — ONDANSETRON HCL 4 MG/2ML IJ SOLN
INTRAMUSCULAR | Status: AC
  Filled 2016-06-18: qty 2

## 2016-06-18 MED ORDER — ONDANSETRON HCL 4 MG PO TABS
4.0000 mg | ORAL_TABLET | Freq: Four times a day (QID) | ORAL | Status: DC | PRN
  Administered 2016-06-18 – 2016-06-20 (×2): 4 mg via ORAL
  Filled 2016-06-18 (×2): qty 1

## 2016-06-18 MED ORDER — MAGNESIUM CITRATE PO SOLN: 1.0000 | Freq: Once | ORAL | Status: DC | PRN

## 2016-06-18 MED ORDER — BUPIVACAINE HCL (PF) 0.25 % IJ SOLN
INTRAMUSCULAR | Status: DC | PRN
  Administered 2016-06-18: 20 mL

## 2016-06-18 MED ORDER — DEXAMETHASONE SODIUM PHOSPHATE 10 MG/ML IJ SOLN
10.0000 mg | Freq: Once | INTRAMUSCULAR | Status: AC
  Administered 2016-06-19: 08:00:00 10 mg via INTRAVENOUS
  Filled 2016-06-18: qty 1

## 2016-06-18 MED ORDER — SODIUM CHLORIDE 0.9 % IV SOLN
INTRAVENOUS | Status: DC
  Administered 2016-06-18 – 2016-06-19 (×2): via INTRAVENOUS
  Filled 2016-06-18 (×8): qty 1000

## 2016-06-18 MED ORDER — SODIUM CHLORIDE 0.9 % IJ SOLN
INTRAMUSCULAR | Status: DC | PRN
  Administered 2016-06-18: 20 mL

## 2016-06-18 MED ORDER — DOCUSATE SODIUM 100 MG PO CAPS
100.0000 mg | ORAL_CAPSULE | Freq: Two times a day (BID) | ORAL | Status: DC
  Administered 2016-06-18 – 2016-06-21 (×5): 100 mg via ORAL
  Filled 2016-06-18 (×6): qty 1

## 2016-06-18 MED ORDER — DEXAMETHASONE SODIUM PHOSPHATE 10 MG/ML IJ SOLN
10.0000 mg | Freq: Once | INTRAMUSCULAR | Status: AC
  Administered 2016-06-18: 10 mg via INTRAVENOUS

## 2016-06-18 MED ORDER — LORAZEPAM 1 MG PO TABS
1.0000 mg | ORAL_TABLET | Freq: Every day | ORAL | Status: DC
  Administered 2016-06-18 – 2016-06-20 (×3): 1 mg via ORAL
  Filled 2016-06-18 (×3): qty 1

## 2016-06-18 MED ORDER — ONDANSETRON HCL 4 MG/2ML IJ SOLN
4.0000 mg | Freq: Four times a day (QID) | INTRAMUSCULAR | Status: DC | PRN
  Administered 2016-06-19: 4 mg via INTRAVENOUS
  Filled 2016-06-18 (×2): qty 2

## 2016-06-18 MED ORDER — HYDROMORPHONE HCL 2 MG/ML IJ SOLN
0.5000 mg | INTRAMUSCULAR | Status: DC | PRN
  Administered 2016-06-18: 18:00:00 0.5 mg via INTRAVENOUS
  Administered 2016-06-18: 1 mg via INTRAVENOUS
  Filled 2016-06-18 (×2): qty 1

## 2016-06-18 MED ORDER — HYDROCODONE-ACETAMINOPHEN 7.5-325 MG PO TABS
ORAL_TABLET | ORAL | Status: AC
  Filled 2016-06-18: qty 1

## 2016-06-18 MED ORDER — METFORMIN HCL 500 MG PO TABS
1000.0000 mg | ORAL_TABLET | Freq: Two times a day (BID) | ORAL | Status: DC
  Administered 2016-06-19 – 2016-06-21 (×4): 1000 mg via ORAL
  Filled 2016-06-18 (×4): qty 2

## 2016-06-18 MED ORDER — DEXAMETHASONE SODIUM PHOSPHATE 10 MG/ML IJ SOLN
INTRAMUSCULAR | Status: AC
  Filled 2016-06-18: qty 1

## 2016-06-18 MED ORDER — INSULIN ASPART 100 UNIT/ML ~~LOC~~ SOLN
0.0000 [IU] | Freq: Three times a day (TID) | SUBCUTANEOUS | Status: DC
  Administered 2016-06-18: 2 [IU] via SUBCUTANEOUS
  Administered 2016-06-19 – 2016-06-20 (×3): 3 [IU] via SUBCUTANEOUS
  Administered 2016-06-20 (×2): 2 [IU] via SUBCUTANEOUS

## 2016-06-18 MED ORDER — MENTHOL 3 MG MT LOZG
1.0000 | LOZENGE | OROMUCOSAL | Status: DC | PRN
Start: 2016-06-18 — End: 2016-06-21

## 2016-06-18 MED ORDER — BUPIVACAINE HCL (PF) 0.5 % IJ SOLN
INTRAMUSCULAR | Status: AC
  Filled 2016-06-18: qty 30

## 2016-06-18 MED ORDER — LACTATED RINGERS IV SOLN
INTRAVENOUS | Status: DC
  Administered 2016-06-18: 08:00:00 via INTRAVENOUS

## 2016-06-18 MED ORDER — SODIUM CHLORIDE 0.9 % IR SOLN
Status: DC | PRN
  Administered 2016-06-18: 1000 mL

## 2016-06-18 MED ORDER — BISACODYL 10 MG RE SUPP
10.0000 mg | Freq: Every day | RECTAL | Status: DC | PRN
  Administered 2016-06-20: 10:00:00 10 mg via RECTAL
  Filled 2016-06-18: qty 1

## 2016-06-18 MED ORDER — METHOCARBAMOL 500 MG PO TABS: 500.0000 mg | ORAL_TABLET | Freq: Four times a day (QID) | ORAL | 0 refills | Status: DC | PRN

## 2016-06-18 MED ORDER — TRANEXAMIC ACID 1000 MG/10ML IV SOLN
1000.0000 mg | INTRAVENOUS | Status: AC
  Administered 2016-06-18: 1000 mg via INTRAVENOUS
  Filled 2016-06-18: qty 1100

## 2016-06-18 MED ORDER — HYDROMORPHONE HCL 1 MG/ML IJ SOLN: 0.2500 mg | INTRAMUSCULAR | Status: DC | PRN

## 2016-06-18 MED ORDER — METHOCARBAMOL 500 MG PO TABS
500.0000 mg | ORAL_TABLET | Freq: Four times a day (QID) | ORAL | Status: DC | PRN
  Administered 2016-06-19 – 2016-06-21 (×7): 500 mg via ORAL
  Filled 2016-06-18 (×7): qty 1

## 2016-06-18 MED ORDER — SODIUM CHLORIDE 0.9 % IJ SOLN
INTRAMUSCULAR | Status: AC
  Filled 2016-06-18: qty 50

## 2016-06-18 MED ORDER — ROPIVACAINE HCL 5 MG/ML IJ SOLN
INTRAMUSCULAR | Status: DC | PRN
  Administered 2016-06-18: 30 mL via PERINEURAL

## 2016-06-18 MED ORDER — PROMETHAZINE HCL 25 MG/ML IJ SOLN: 6.2500 mg | INTRAMUSCULAR | Status: DC | PRN

## 2016-06-18 MED ORDER — CEFAZOLIN SODIUM-DEXTROSE 2-4 GM/100ML-% IV SOLN
2.0000 g | INTRAVENOUS | Status: AC
  Administered 2016-06-18: 2 g via INTRAVENOUS

## 2016-06-18 MED ORDER — ASPIRIN 81 MG PO CHEW
81.0000 mg | CHEWABLE_TABLET | Freq: Two times a day (BID) | ORAL | Status: DC
Start: 2016-06-18 — End: 2016-06-21
  Administered 2016-06-18 – 2016-06-21 (×6): 81 mg via ORAL
  Filled 2016-06-18 (×6): qty 1

## 2016-06-18 MED ORDER — RISAQUAD PO CAPS
1.0000 | ORAL_CAPSULE | ORAL | Status: DC
  Administered 2016-06-18: 1 via ORAL
  Filled 2016-06-18 (×2): qty 1

## 2016-06-18 MED ORDER — DIPHENHYDRAMINE HCL 25 MG PO CAPS: 25.0000 mg | ORAL_CAPSULE | Freq: Four times a day (QID) | ORAL | Status: DC | PRN

## 2016-06-18 MED ORDER — MIDAZOLAM HCL 5 MG/ML IJ SOLN: 1.0000 mg | INTRAMUSCULAR | Status: DC | PRN

## 2016-06-18 MED ORDER — ALUM & MAG HYDROXIDE-SIMETH 200-200-20 MG/5ML PO SUSP: 15.0000 mL | ORAL | Status: DC | PRN

## 2016-06-18 MED ORDER — CHLORHEXIDINE GLUCONATE 4 % EX LIQD: 60.0000 mL | Freq: Once | CUTANEOUS | Status: DC

## 2016-06-18 MED ORDER — CEFAZOLIN SODIUM-DEXTROSE 2-4 GM/100ML-% IV SOLN
INTRAVENOUS | Status: AC
  Filled 2016-06-18: qty 100

## 2016-06-18 MED ORDER — LIDOCAINE 2% (20 MG/ML) 5 ML SYRINGE
INTRAMUSCULAR | Status: DC | PRN
  Administered 2016-06-18: 100 mg via INTRAVENOUS

## 2016-06-18 MED ORDER — PHENOL 1.4 % MT LIQD
1.0000 | OROMUCOSAL | Status: DC | PRN
  Filled 2016-06-18: qty 177

## 2016-06-18 MED ORDER — CELECOXIB 200 MG PO CAPS
200.0000 mg | ORAL_CAPSULE | Freq: Two times a day (BID) | ORAL | Status: DC
  Administered 2016-06-18 – 2016-06-21 (×5): 200 mg via ORAL
  Filled 2016-06-18 (×6): qty 1

## 2016-06-18 MED ORDER — CEFAZOLIN SODIUM-DEXTROSE 2-4 GM/100ML-% IV SOLN
2.0000 g | Freq: Four times a day (QID) | INTRAVENOUS | Status: AC
  Administered 2016-06-18 (×2): 2 g via INTRAVENOUS
  Filled 2016-06-18 (×3): qty 100

## 2016-06-18 MED ORDER — METHOCARBAMOL 1000 MG/10ML IJ SOLN
500.0000 mg | Freq: Four times a day (QID) | INTRAMUSCULAR | Status: DC | PRN
  Administered 2016-06-18: 500 mg via INTRAVENOUS
  Filled 2016-06-18: qty 550

## 2016-06-18 MED ORDER — KETOROLAC TROMETHAMINE 30 MG/ML IJ SOLN
INTRAMUSCULAR | Status: AC
  Filled 2016-06-18: qty 1

## 2016-06-18 MED ORDER — LACTATED RINGERS IV SOLN
INTRAVENOUS | Status: DC
  Administered 2016-06-18: 13:00:00 via INTRAVENOUS

## 2016-06-18 MED ORDER — PROPOFOL 10 MG/ML IV BOLUS
INTRAVENOUS | Status: AC
  Filled 2016-06-18: qty 20

## 2016-06-18 MED ORDER — HYDROCODONE-ACETAMINOPHEN 7.5-325 MG PO TABS: 1.0000 | ORAL_TABLET | ORAL | 0 refills | Status: DC | PRN

## 2016-06-18 SURGICAL SUPPLY — 50 items
ADH SKN CLS APL DERMABOND .7 (GAUZE/BANDAGES/DRESSINGS) ×1
BANDAGE ACE 6X5 VEL STRL LF (GAUZE/BANDAGES/DRESSINGS) ×3 IMPLANT
BLADE SAW SGTL 11.0X1.19X90.0M (BLADE) ×3 IMPLANT
BLADE SAW SGTL 13.0X1.19X90.0M (BLADE) ×3 IMPLANT
BONE CEMENT GENTAMICIN (Cement) ×6 IMPLANT
BOWL SMART MIX CTS (DISPOSABLE) ×3 IMPLANT
CAPT KNEE TOTAL 3 ATTUNE ×3 IMPLANT
CEMENT BONE GENTAMICIN 40 (Cement) ×2 IMPLANT
COVER SURGICAL LIGHT HANDLE (MISCELLANEOUS) ×3 IMPLANT
CUFF TOURN SGL QUICK 34 (TOURNIQUET CUFF) ×3
CUFF TRNQT CYL 34X4X40X1 (TOURNIQUET CUFF) ×1 IMPLANT
DECANTER SPIKE VIAL GLASS SM (MISCELLANEOUS) ×3 IMPLANT
DERMABOND ADVANCED (GAUZE/BANDAGES/DRESSINGS) ×2
DERMABOND ADVANCED .7 DNX12 (GAUZE/BANDAGES/DRESSINGS) ×1 IMPLANT
DRAPE U-SHAPE 47X51 STRL (DRAPES) ×3 IMPLANT
DRESSING AQUACEL AG SP 3.5X10 (GAUZE/BANDAGES/DRESSINGS) ×1 IMPLANT
DRSG AQUACEL AG SP 3.5X10 (GAUZE/BANDAGES/DRESSINGS) ×3
DURAPREP 26ML APPLICATOR (WOUND CARE) ×6 IMPLANT
ELECT REM PT RETURN 15FT ADLT (MISCELLANEOUS) ×3 IMPLANT
GLOVE BIOGEL PI IND STRL 6.5 (GLOVE) ×1 IMPLANT
GLOVE BIOGEL PI IND STRL 7.5 (GLOVE) ×4 IMPLANT
GLOVE BIOGEL PI IND STRL 8 (GLOVE) ×2 IMPLANT
GLOVE BIOGEL PI IND STRL 8.5 (GLOVE) ×1 IMPLANT
GLOVE BIOGEL PI INDICATOR 6.5 (GLOVE) ×2
GLOVE BIOGEL PI INDICATOR 7.5 (GLOVE) ×8
GLOVE BIOGEL PI INDICATOR 8 (GLOVE) ×4
GLOVE BIOGEL PI INDICATOR 8.5 (GLOVE) ×2
GLOVE SURG SS PI 6.5 STRL IVOR (GLOVE) ×3 IMPLANT
GLOVE SURG SS PI 7.5 STRL IVOR (GLOVE) ×9 IMPLANT
GLOVE SURG SS PI 8.0 STRL IVOR (GLOVE) ×3 IMPLANT
GLOVE SURG SS PI 8.5 STRL IVOR (GLOVE) ×2
GLOVE SURG SS PI 8.5 STRL STRW (GLOVE) ×1 IMPLANT
GOWN STRL REUS W/TWL LRG LVL3 (GOWN DISPOSABLE) ×6 IMPLANT
GOWN STRL REUS W/TWL XL LVL3 (GOWN DISPOSABLE) ×6 IMPLANT
HANDPIECE INTERPULSE COAX TIP (DISPOSABLE) ×3
MANIFOLD NEPTUNE II (INSTRUMENTS) ×3 IMPLANT
PACK TOTAL KNEE CUSTOM (KITS) ×3 IMPLANT
POSITIONER SURGICAL ARM (MISCELLANEOUS) ×3 IMPLANT
SET HNDPC FAN SPRY TIP SCT (DISPOSABLE) ×1 IMPLANT
SET PAD KNEE POSITIONER (MISCELLANEOUS) ×3 IMPLANT
SUT MNCRL AB 4-0 PS2 18 (SUTURE) ×3 IMPLANT
SUT STRATAFIX 0 PDS 27 VIOLET (SUTURE) ×3
SUT VIC AB 1 CT1 36 (SUTURE) ×3 IMPLANT
SUT VIC AB 2-0 CT1 27 (SUTURE) ×9
SUT VIC AB 2-0 CT1 TAPERPNT 27 (SUTURE) ×3 IMPLANT
SUTURE STRATFX 0 PDS 27 VIOLET (SUTURE) ×1 IMPLANT
SYR 50ML LL SCALE MARK (SYRINGE) ×3 IMPLANT
TRAY FOLEY BAG SILVER LF 16FR (SET/KITS/TRAYS/PACK) ×3 IMPLANT
WRAP KNEE MAXI GEL POST OP (GAUZE/BANDAGES/DRESSINGS) ×3 IMPLANT
YANKAUER SUCT BULB TIP 10FT TU (MISCELLANEOUS) ×3 IMPLANT

## 2016-06-18 NOTE — Progress Notes (Signed)
Assisted Dr. Rose with left, ultrasound guided, adductor canal block. Side rails up, monitors on throughout procedure. See vital signs in flow sheet. Tolerated Procedure well.  

## 2016-06-18 NOTE — Op Note (Signed)
NAME:  Bright RECORD NO.:  [id number]                             FACILITY:  [facility]      PHYSICIAN:  Pietro Cassis. Alvan Dame, M.D.  DATE OF BIRTH:  1951-01-16      DATE OF PROCEDURE:  06/18/2016                                     OPERATIVE REPORT         PREOPERATIVE DIAGNOSIS:  Left knee osteoarthritis.      POSTOPERATIVE DIAGNOSIS:  Left knee osteoarthritis.      FINDINGS:  The patient was noted to have complete loss of cartilage and   bone-on-bone arthritis with associated osteophytes in the medial and patellofemoral compartments of   the knee with a significant synovitis and associated effusion.      PROCEDURE:  Left total knee replacement.      COMPONENTS USED:  DePuy Attune rotating platform posterior stabilized knee   system, a size 4N femur, 4 tibia, size 5 PS AOX insert, and 35 anatomic patellar   button.      SURGEON:  Pietro Cassis. Alvan Dame, M.D.      ASSISTANT:  Danae Orleans, PA-C.      ANESTHESIA:  Regional and Spinal.      SPECIMENS:  None.      COMPLICATION:  None.      DRAINS:  None.  EBL: <150cc      TOURNIQUET TIME:   Total Tourniquet Time Documented: Thigh (Left) - 33 minutes Total: Thigh (Left) - 33 minutes  .      The patient was stable to the recovery room.      INDICATION FOR PROCEDURE:  Destiny Shaw is a 66 y.o. female patient of   mine.  The patient had been seen, evaluated, and treated conservatively in the   office with medication, activity modification, and injections.  The patient had   radiographic changes of bone-on-bone arthritis with endplate sclerosis and osteophytes noted.      The patient failed conservative measures including medication, injections, and activity modification, and at this point was ready for more definitive measures.   Based on the radiographic changes and failed conservative measures, the patient   decided to proceed with total knee replacement.  Risks of infection,   DVT, component  failure, need for revision surgery, postop course, and   expectations were all   discussed and reviewed.  Consent was obtained for benefit of pain   relief.      PROCEDURE IN DETAIL:  The patient was brought to the operative theater.   Once adequate anesthesia, preoperative antibiotics, 2 gm of Ancef, 1 gm of Tranexamic Acid, and 10 mg of Decadron administered, the patient was positioned supine with the left thigh tourniquet placed.  The  left lower extremity was prepped and draped in sterile fashion.  A time-   out was performed identifying the patient, planned procedure, and   extremity.      The left lower extremity was placed in the [employer] leg holder.  The leg was   exsanguinated, tourniquet elevated to 250 mmHg.  A midline incision was  made followed by median parapatellar arthrotomy.  Following initial   exposure, attention was first directed to the patella.  Precut   measurement was noted to be 22 mm.  I resected down to 14 mm and used a   35 anatomic patellar button to restore patellar height as well as cover the cut   surface.      The lug holes were drilled and a metal shim was placed to protect the   patella from retractors and saw blades.      At this point, attention was now directed to the femur.  The femoral   canal was opened with a drill, irrigated to try to prevent fat emboli.  An   intramedullary rod was passed at 3 degrees valgus, 9 mm of bone was   resected off the distal femur.  Following this resection, the tibia was   subluxated anteriorly.  Using the extramedullary guide, 2 mm of bone was resected off   the proximal lateral tibia.  We confirmed the gap would be   stable medially and laterally with a size 5 spacer block as well as confirmed   the cut was perpendicular in the coronal plane, checking with an alignment rod.      Once this was done, I sized the femur to be a size 4 in the anterior-   posterior dimension, chose a narrow component based on medial  and   lateral dimension.  The size 4 rotation block was then pinned in   position anterior referenced using the C-clamp to set rotation.  The   anterior, posterior, and  chamfer cuts were made without difficulty nor   notching making certain that I was along the anterior cortex to help   with flexion gap stability.      The final box cut was made off the lateral aspect of distal femur.      At this point, the tibia was sized to be a size 4, the size 4 tray was   then pinned in position through the medial third of the tubercle,   drilled, and keel punched.  Trial reduction was now carried with a 4 femur,  4 tibia, a size 5 PS insert, and the 35 anatomic patella botton.  The knee was brought to   extension, full extension with good flexion stability with the patella   tracking through the trochlea without application of pressure.  Given   all these findings the femoral lug holes were drilled and then the trial components removed.  Final components were   opened and cement was mixed.  The knee was irrigated with normal saline   solution and pulse lavage.  The synovial lining was   then injected with 20 cc of 0.25% Marcaine with epinephrine and 1 cc of Toradol plus 30 cc of NS for a total of 61 cc.      The knee was irrigated.  Final implants were then cemented onto clean and   dried cut surfaces of bone with the knee brought to extension with a size 5 PS trial insert.      Once the cement had fully cured, the excess cement was removed   throughout the knee.  I confirmed I was satisfied with the range of   motion and stability, and the final size 5 PS AOX insert was chosen.  It was   placed into the knee.      The tourniquet had been let down at 33 minutes.  No significant   hemostasis required.  The   extensor mechanism was then reapproximated using #1 Vicryl and #0 Stratafix sutures with the knee   in flexion.  The   remaining wound was closed with 2-0 Vicryl and running 4-0 Monocryl.    The knee was cleaned, dried, dressed sterilely using Dermabond and   Aquacel dressing.  The patient was then   brought to recovery room in stable condition, tolerating the procedure   well.   Please note that Physician Assistant, Danae Orleans, PA-C, was present for the entirety of the case, and was utilized for pre-operative positioning, peri-operative retractor management, general facilitation of the procedure.  He was also utilized for primary wound closure at the end of the case.              Pietro Cassis Alvan Dame, M.D.    06/18/2016 11:27 AM

## 2016-06-18 NOTE — Anesthesia Procedure Notes (Signed)
Spinal  Patient location during procedure: OR Start time: 06/18/2016 10:17 AM End time: 06/18/2016 10:17 AM Staffing Anesthesiologist: Jonay Hitchcock, Iona Beard Performed: anesthesiologist  Preanesthetic Checklist Completed: patient identified, site marked, surgical consent, pre-op evaluation, timeout performed, IV checked, risks and benefits discussed and monitors and equipment checked Spinal Block Patient position: sitting Prep: Betadine Patient monitoring: heart rate, continuous pulse ox and blood pressure Injection technique: single-shot Needle Needle type: Pencan  Needle gauge: 22 G Needle length: 9 cm Additional Notes Expiration date of kit checked and confirmed. Patient tolerated procedure well, without complications.

## 2016-06-18 NOTE — Interval H&P Note (Signed)
History and Physical Interval Note:  06/18/2016 9:00 AM  Destiny Shaw  has presented today for surgery, with the diagnosis of left knee osteoarthritis  The various methods of treatment have been discussed with the patient and family. After consideration of risks, benefits and other options for treatment, the patient has consented to  Procedure(s): LEFT TOTAL KNEE ARTHROPLASTY (Left) as a surgical intervention .  The patient's history has been reviewed, patient examined, no change in status, stable for surgery.  I have reviewed the patient's chart and labs.  Questions were answered to the patient's satisfaction.     Mauri Pole

## 2016-06-18 NOTE — Anesthesia Procedure Notes (Signed)
Anesthesia Regional Block: Adductor canal block   Pre-Anesthetic Checklist: ,, timeout performed, Correct Patient, Correct Site, Correct Laterality, Correct Procedure, Correct Position, site marked, Risks and benefits discussed,  Surgical consent,  Pre-op evaluation,  At surgeon's request and post-op pain management  Laterality: Left  Prep: chloraprep       Needles:  Injection technique: Single-shot  Needle Type: Echogenic Needle     Needle Length: 9cm      Additional Needles:   Procedures: ultrasound guided,,,,,,,,  Narrative:  Start time: 06/18/2016 9:00 AM End time: 06/18/2016 9:10 AM Injection made incrementally with aspirations every 5 mL.  Performed by: Personally  Anesthesiologist: Horton Ellithorpe  Additional Notes: Patient tolerated the procedure well without complications

## 2016-06-18 NOTE — Anesthesia Procedure Notes (Signed)
Procedure Name: MAC Date/Time: 06/18/2016 9:59 AM Performed by: Carleene Cooper A Pre-anesthesia Checklist: Patient being monitored, Timeout performed, Suction available, Emergency Drugs available and Patient identified Patient Re-evaluated:Patient Re-evaluated prior to inductionOxygen Delivery Method: Simple face mask Dental Injury: Teeth and Oropharynx as per pre-operative assessment

## 2016-06-18 NOTE — Discharge Instructions (Signed)

## 2016-06-18 NOTE — Anesthesia Preprocedure Evaluation (Addendum)
Anesthesia Evaluation  Patient identified by MRN, date of birth, ID band Patient awake    Reviewed: Allergy & Precautions, NPO status , Patient's Chart, lab work & pertinent test results  Airway Mallampati: II  TM Distance: >3 FB Neck ROM: Full    Dental no notable dental hx.    Pulmonary sleep apnea ,    Pulmonary exam normal breath sounds clear to auscultation       Cardiovascular hypertension, Normal cardiovascular exam Rhythm:Regular Rate:Normal     Neuro/Psych negative neurological ROS  negative psych ROS   GI/Hepatic negative GI ROS, Neg liver ROS,   Endo/Other  diabetesHypothyroidism   Renal/GU negative Renal ROS  negative genitourinary   Musculoskeletal negative musculoskeletal ROS (+)   Abdominal   Peds negative pediatric ROS (+)  Hematology negative hematology ROS (+)   Anesthesia Other Findings   Reproductive/Obstetrics negative OB ROS                             Anesthesia Physical Anesthesia Plan  ASA: III  Anesthesia Plan: Spinal   Post-op Pain Management:  Regional for Post-op pain   Induction:   Airway Management Planned: Simple Face Mask  Additional Equipment:   Intra-op Plan:   Post-operative Plan:   Informed Consent: I have reviewed the patients History and Physical, chart, labs and discussed the procedure including the risks, benefits and alternatives for the proposed anesthesia with the patient or authorized representative who has indicated his/her understanding and acceptance.   Dental advisory given  Plan Discussed with: CRNA and Surgeon  Anesthesia Plan Comments:         Anesthesia Quick Evaluation

## 2016-06-18 NOTE — Transfer of Care (Signed)
Immediate Anesthesia Transfer of Care Note  Patient: Destiny Shaw  Procedure(s) Performed: Procedure(s) with comments: LEFT TOTAL KNEE ARTHROPLASTY (Left) - Adductor Block  Patient Location: PACU  Anesthesia Type:MAC and Spinal  Level of Consciousness: awake, alert , oriented and patient cooperative  Airway & Oxygen Therapy: Patient Spontanous Breathing and Patient connected to face mask oxygen  Post-op Assessment: Report given to RN and Post -op Vital signs reviewed and stable  Post vital signs: Reviewed and stable  Last Vitals:  Vitals:   06/18/16 0944 06/18/16 0946  BP:    Pulse: 66 (!) 59  Resp: 20 10  Temp:      Last Pain:  Vitals:   06/18/16 0806  TempSrc: Oral      Patients Stated Pain Goal: 3 (93/81/82 9937)  Complications: No apparent anesthesia complications

## 2016-06-18 NOTE — Anesthesia Postprocedure Evaluation (Signed)
Anesthesia Post Note  Patient: Destiny Shaw  Procedure(s) Performed: Procedure(s) (LRB): LEFT TOTAL KNEE ARTHROPLASTY (Left)  Patient location during evaluation: PACU Anesthesia Type: Spinal Level of consciousness: oriented and awake and alert Pain management: pain level controlled Vital Signs Assessment: post-procedure vital signs reviewed and stable Respiratory status: spontaneous breathing, respiratory function stable and patient connected to nasal cannula oxygen Cardiovascular status: blood pressure returned to baseline and stable Postop Assessment: no headache and no backache Anesthetic complications: no       Last Vitals:  Vitals:   06/18/16 1245 06/18/16 1300  BP: (!) 150/83 (!) 141/75  Pulse: 71 71  Resp: 19 18  Temp:      Last Pain:  Vitals:   06/18/16 1300  TempSrc:   PainSc: 4                  Maceo Hernan S

## 2016-06-19 ENCOUNTER — Other Ambulatory Visit: Payer: Self-pay

## 2016-06-19 DIAGNOSIS — F419 Anxiety disorder, unspecified: Secondary | ICD-10-CM | POA: Diagnosis not present

## 2016-06-19 DIAGNOSIS — E119 Type 2 diabetes mellitus without complications: Secondary | ICD-10-CM | POA: Diagnosis not present

## 2016-06-19 DIAGNOSIS — F329 Major depressive disorder, single episode, unspecified: Secondary | ICD-10-CM | POA: Diagnosis not present

## 2016-06-19 DIAGNOSIS — M1712 Unilateral primary osteoarthritis, left knee: Secondary | ICD-10-CM | POA: Diagnosis not present

## 2016-06-19 DIAGNOSIS — E669 Obesity, unspecified: Secondary | ICD-10-CM | POA: Diagnosis not present

## 2016-06-19 DIAGNOSIS — Z96651 Presence of right artificial knee joint: Secondary | ICD-10-CM | POA: Insufficient documentation

## 2016-06-19 DIAGNOSIS — Z6831 Body mass index (BMI) 31.0-31.9, adult: Secondary | ICD-10-CM | POA: Diagnosis not present

## 2016-06-19 DIAGNOSIS — E039 Hypothyroidism, unspecified: Secondary | ICD-10-CM | POA: Diagnosis not present

## 2016-06-19 DIAGNOSIS — I10 Essential (primary) hypertension: Secondary | ICD-10-CM | POA: Diagnosis not present

## 2016-06-19 DIAGNOSIS — M25562 Pain in left knee: Secondary | ICD-10-CM | POA: Diagnosis present

## 2016-06-19 DIAGNOSIS — E785 Hyperlipidemia, unspecified: Secondary | ICD-10-CM | POA: Diagnosis not present

## 2016-06-19 DIAGNOSIS — K219 Gastro-esophageal reflux disease without esophagitis: Secondary | ICD-10-CM | POA: Diagnosis not present

## 2016-06-19 DIAGNOSIS — G4730 Sleep apnea, unspecified: Secondary | ICD-10-CM | POA: Diagnosis not present

## 2016-06-19 DIAGNOSIS — F988 Other specified behavioral and emotional disorders with onset usually occurring in childhood and adolescence: Secondary | ICD-10-CM | POA: Diagnosis not present

## 2016-06-19 LAB — TYPE AND SCREEN
ABO/RH(D): O POS
ANTIBODY SCREEN: NEGATIVE

## 2016-06-19 LAB — CBC
HEMATOCRIT: 32.6 % — AB (ref 36.0–46.0)
HEMOGLOBIN: 10.4 g/dL — AB (ref 12.0–15.0)
MCH: 26.5 pg (ref 26.0–34.0)
MCHC: 31.9 g/dL (ref 30.0–36.0)
MCV: 83.2 fL (ref 78.0–100.0)
Platelets: 201 10*3/uL (ref 150–400)
RBC: 3.92 MIL/uL (ref 3.87–5.11)
RDW: 14.3 % (ref 11.5–15.5)
WBC: 9.4 10*3/uL (ref 4.0–10.5)

## 2016-06-19 LAB — BASIC METABOLIC PANEL
ANION GAP: 6 (ref 5–15)
BUN: 9 mg/dL (ref 6–20)
CALCIUM: 8.3 mg/dL — AB (ref 8.9–10.3)
CO2: 25 mmol/L (ref 22–32)
Chloride: 107 mmol/L (ref 101–111)
Creatinine, Ser: 0.52 mg/dL (ref 0.44–1.00)
GFR calc non Af Amer: >60 mL/min (ref >60)
Glucose, Bld: 145 mg/dL — ABNORMAL HIGH (ref 65–99)
POTASSIUM: 4.1 mmol/L (ref 3.5–5.1)
Sodium: 138 mmol/L (ref 135–145)

## 2016-06-19 LAB — GLUCOSE, CAPILLARY
GLUCOSE-CAPILLARY: 167 mg/dL — AB (ref 65–99)
GLUCOSE-CAPILLARY: 168 mg/dL — AB (ref 65–99)
Glucose-Capillary: 119 mg/dL — ABNORMAL HIGH (ref 65–99)
Glucose-Capillary: 181 mg/dL — ABNORMAL HIGH (ref 65–99)
Glucose-Capillary: 104 mg/dL — ABNORMAL HIGH (ref 65–99)

## 2016-06-19 MED ORDER — LORAZEPAM 0.5 MG PO TABS
0.5000 mg | ORAL_TABLET | Freq: Every day | ORAL | Status: DC | PRN
  Administered 2016-06-19 – 2016-06-20 (×2): 0.5 mg via ORAL
  Filled 2016-06-19 (×2): qty 1

## 2016-06-19 NOTE — Progress Notes (Signed)
Patient feeling palpitations after ambulating back to bathroom. After listening to a apical pulse, EKG done. Results tachycardia with PVC. Results called to [facility]. No new orders given.

## 2016-06-19 NOTE — Progress Notes (Signed)
Lives with spouse in a 1 story home with 3 ste. No DME needs, has a RW and 3-in-1. Scheduled to start OP PT at [facility] on 06-22-16. [phone]

## 2016-06-19 NOTE — Evaluation (Signed)
Physical Therapy Evaluation Patient Details Name: Destiny Shaw MRN: [id number] DOB: 04/09/1950 Today's Date: 06/19/2016   History of Present Illness  s/p L TKA, h/o ADD, s/p back sx, concussion, imbalance  Clinical Impression  Pt is s/p TKA resulting in the deficits listed below (see PT Problem List).  Pt will benefit from skilled PT to increase their independence and safety with mobility to allow discharge to the venue listed below.  Pt ambulated in hallway and performed LE exercises.  Pt plans to d/c home and start with outpatient therapy.      Follow Up Recommendations Outpatient PT    Equipment Recommendations  None recommended by PT    Recommendations for Other Services       Precautions / Restrictions Precautions Precautions: Knee;Fall Restrictions Weight Bearing Restrictions: No Other Position/Activity Restrictions: WBAT      Mobility  Bed Mobility Overal bed mobility: Needs Assistance Bed Mobility: Supine to Sit;Sit to Supine     Supine to sit: Min guard Sit to supine: Min assist   General bed mobility comments: verbal cues for self assist, required assist for L LE onto bed  Transfers Overall transfer level: Needs assistance Equipment used: Rolling walker (2 wheeled) Transfers: Sit to/from Stand Sit to Stand: Min guard Stand pivot transfers: Min assist       General transfer comment: min/guard for safety, verbal cues for UE and LE positioning  Ambulation/Gait Ambulation/Gait assistance: Min guard Ambulation Distance (Feet): 100 Feet Assistive device: Rolling walker (2 wheeled) Gait Pattern/deviations: Step-to pattern;Antalgic;Decreased stance time - left     General Gait Details: verbal cues for sequence, RW positinoing, posture, step length  Stairs            Wheelchair Mobility    Modified Rankin (Stroke Patients Only)       Balance                                             Pertinent Vitals/Pain Pain  Assessment: 0-10 Pain Score: 3  Pain Location: L knee Pain Descriptors / Indicators: Aching;Sore Pain Intervention(s): Limited activity within patient's tolerance;Monitored during session;Repositioned    Home Living Family/patient expects to be discharged to:: Private residence Living Arrangements: Spouse/significant other Available Help at Discharge: Family Type of Home: House Home Access: Stairs to enter Entrance Stairs-Rails: None Technical brewer of Steps: 3 Home Layout: One level Home Equipment: Bedside commode;Walker - 2 wheels Additional Comments: hasn't been using BSC    Prior Function Level of Independence: Independent               Hand Dominance   Dominant Hand: Right    Extremity/Trunk Assessment   Upper Extremity Assessment Upper Extremity Assessment: Overall WFL for tasks assessed    Lower Extremity Assessment Lower Extremity Assessment: LLE deficits/detail LLE Deficits / Details: unable to perform SLR, L knee AAROM flexion approx 40*       Communication   Communication: No difficulties  Cognition Arousal/Alertness: Awake/alert Behavior During Therapy: WFL for tasks assessed/performed Overall Cognitive Status: Within Functional Limits for tasks assessed                                 General Comments: sometimes loses train of thought      General Comments      Exercises Total Joint Exercises  Ankle Circles/Pumps: AROM;Both;10 reps Quad Sets: AROM;Both;10 reps Short Arc Quad: AROM;10 reps;Left Heel Slides: AAROM;Left;Seated;10 reps Hip ABduction/ADduction: AROM;Left;10 reps Straight Leg Raises: AAROM;Left;10 reps   Assessment/Plan    PT Assessment Patient needs continued PT services  PT Problem List Decreased strength;Decreased range of motion;Decreased mobility;Decreased knowledge of use of DME;Pain;Decreased knowledge of precautions       PT Treatment Interventions Functional mobility training;Stair  training;Gait training;DME instruction;Therapeutic activities;Therapeutic exercise;Patient/family education    PT Goals (Current goals can be found in the Care Plan section)  Acute Rehab PT Goals Patient Stated Goal: get back to walking in woods PT Goal Formulation: With patient Time For Goal Achievement: 06/22/16 Potential to Achieve Goals: Good    Frequency 7X/week   Barriers to discharge        Co-evaluation               End of Session Equipment Utilized During Treatment: Gait belt Activity Tolerance: Patient tolerated treatment well Patient left: in bed;with call bell/phone within reach   PT Visit Diagnosis: Other abnormalities of gait and mobility (R26.89);Pain Pain - Right/Left: Left Pain - part of body: Knee    Time: 7340-3709 PT Time Calculation (min) (ACUTE ONLY): 28 min   Charges:   PT Evaluation $PT Eval Low Complexity: 1 Procedure PT Treatments $Therapeutic Exercise: 8-22 mins   PT G Codes:        Carmelia Bake, PT, DPT 06/19/2016 Pager: [phone]   York Ram E 06/19/2016, 12:14 PM

## 2016-06-19 NOTE — Progress Notes (Signed)
   06/19/16 1500  PT Visit Information  Last PT Received On 06/19/16  Assistance Needed +1  History of Present Illness s/p L TKA, h/o ADD, s/p back sx, concussion, imbalance  Subjective Data  Subjective Pt reports not feeling well however agreeable to mobilize.  Pt ambulated in hallway and then assisted to recliner.  RN brought crackers and ginger ale.  Family present and pt aware to call for assist out of recliner for safety.  Precautions  Precautions Knee;Fall  Restrictions  Other Position/Activity Restrictions WBAT  Pain Assessment  Pain Assessment 0-10  Pain Score 3  Pain Location L knee  Pain Descriptors / Indicators Aching;Sore  Pain Intervention(s) Limited activity within patient's tolerance;Monitored during session;Repositioned;Ice applied  Cognition  Arousal/Alertness Awake/alert  Behavior During Therapy WFL for tasks assessed/performed  Overall Cognitive Status Within Functional Limits for tasks assessed  Bed Mobility  Overal bed mobility Needs Assistance  Bed Mobility Supine to Sit  Supine to sit Min guard  General bed mobility comments verbal cues for scooting to EOB then bring legs over EOB while propping up on both elbows then through hands to assist trunk (pt states she had "burns on elbow" from self assisting to right side at home after a previous surgery and wanted tips to avoid this again)  Transfers  Overall transfer level Needs assistance  Equipment used Rolling walker (2 wheeled)  Transfers Sit to/from Stand  Sit to Stand Min guard  General transfer comment min/guard for safety, verbal cues for UE and LE positioning  Ambulation/Gait  Ambulation/Gait assistance Min guard  Ambulation Distance (Feet) 60 Feet  Assistive device Rolling walker (2 wheeled)  Gait Pattern/deviations Step-to pattern;Antalgic;Decreased stance time - left  General Gait Details verbal cues for sequence, RW positinoing, posture, step length  PT - End of Session  Activity Tolerance  Patient tolerated treatment well  Patient left in chair;with call bell/phone within reach;with family/visitor present  PT - Assessment/Plan  PT Plan Current plan remains appropriate  PT Visit Diagnosis Other abnormalities of gait and mobility (R26.89);Pain  Pain - Right/Left Left  Pain - part of body Knee  PT Frequency (ACUTE ONLY) 7X/week  Follow Up Recommendations Outpatient PT  PT equipment None recommended by PT  AM-PAC PT "6 Clicks" Daily Activity Outcome Measure  Difficulty turning over in bed (including adjusting bedclothes, sheets and blankets)? 4  Difficulty moving from lying on back to sitting on the side of the bed?  3  Difficulty sitting down on and standing up from a chair with arms (e.g., wheelchair, bedside commode, etc,.)? 3  Help needed moving to and from a bed to chair (including a wheelchair)? 3  Help needed walking in hospital room? 3  Help needed climbing 3-5 steps with a railing?  3  6 Click Score 19  Mobility G Code  CJ  PT Goal Progression  Progress towards PT goals Progressing toward goals  PT Time Calculation  PT Start Time (ACUTE ONLY) 1500  PT Stop Time (ACUTE ONLY) 1512  PT Time Calculation (min) (ACUTE ONLY) 12 min  PT General Charges  $$ ACUTE PT VISIT 1 Procedure  PT Treatments  $Gait Training 8-22 mins   Carmelia Bake, PT, DPT 06/19/2016 Pager: [phone]

## 2016-06-19 NOTE — Progress Notes (Signed)
Patient ID: ARENA LINDAHL, female   DOB: 05-05-1950, 66 y.o.   MRN: [id number] Subjective: 1 Day Post-Op Procedure(s) (LRB): LEFT TOTAL KNEE ARTHROPLASTY (Left)    Patient reports pain as moderate.  No events.  Reviewed operative findings and current plans.  Tough night but feeling a bit better this am  Objective:   VITALS:   Vitals:   06/19/16 0536 06/19/16 0900  BP: 127/73 117/90  Pulse: 77 85  Resp: 16   Temp: 97.7 F (36.5 C) 98.4 F (36.9 C)    Neurovascular intact Incision: dressing C/D/I  LABS  Recent Labs  06/19/16 0416  HGB 10.4*  HCT 32.6*  WBC 9.4  PLT 201     Recent Labs  06/19/16 0416  NA 138  K 4.1  BUN 9  CREATININE 0.52  GLUCOSE 145*    No results for input(s): LABPT, INR in the last 72 hours.   Assessment/Plan: 1 Day Post-Op Procedure(s) (LRB): LEFT TOTAL KNEE ARTHROPLASTY (Left)   Past Medical History:  Diagnosis Date  . ADD (attention deficit disorder)   . Anemia   . Anginal pain ([place])    pt states has occas chest pain relates to indigestion; pt uses rest to relieve   . Anxiety   . Arthritis   . Concussion   . Depression   . Diabetes mellitus without complication ([place])   . Dizziness   . Fall   . GERD (gastroesophageal reflux disease)   . Headache   . History of urinary tract infection   . Hyperlipidemia   . Hypertension   . Hypothyroidism   . IBS (irritable bowel syndrome)   . Imbalance   . Numbness    right leg   . Numbness in both hands    comes and goes   . Pneumonia    last episode approx 1 year ago   . Sleep apnea   . Wears glasses     Advance diet Up with therapy Plan for discharge tomorrow   Will continue to monitor pain meds and inpatient therapy to maximize safety for home discharge

## 2016-06-19 NOTE — Evaluation (Signed)
Occupational Therapy Evaluation Patient Details Name: Destiny Shaw MRN: [id number] DOB: 29-Mar-1950 Today's Date: 06/19/2016    History of Present Illness s/p L TKA, h/o ADD, s/p back sx, concussion, imbalance   Clinical Impression   This 66 year old female was admitted for the above. She will benefit from continued OT in acute setting to increase safety and independence with bathroom transfers. Pt will have family's assistance for adls as needed    Follow Up Recommendations  No OT follow up;Supervision/Assistance - 24 hour    Equipment Recommendations  3 in 1 bedside commode    Recommendations for Other Services       Precautions / Restrictions Precautions Precautions: Knee;Fall Restrictions Weight Bearing Restrictions: No Other Position/Activity Restrictions: WBAT      Mobility Bed Mobility Overal bed mobility: Needs Assistance Bed Mobility: Supine to Sit     Supine to sit: Min assist     General bed mobility comments: assist for LLE  Transfers Overall transfer level: Needs assistance Equipment used: Rolling walker (2 wheeled) Transfers: Stand Pivot Transfers   Stand pivot transfers: Min assist       General transfer comment: steadying assist. Cues for UE/LE placement    Balance                                           ADL either performed or assessed with clinical judgement   ADL Overall ADL's : Needs assistance/impaired Eating/Feeding: Independent   Grooming: Min guard;Wash/dry hands;Standing   Upper Body Bathing: Set up;Sitting   Lower Body Bathing: Minimal assistance;Sit to/from stand   Upper Body Dressing : Set up;Sitting   Lower Body Dressing: Moderate assistance;Sit to/from stand   Toilet Transfer: Minimal assistance;BSC;Ambulation;RW   Toileting- Clothing Manipulation and Hygiene: Minimal assistance;Sit to/from stand         General ADL Comments: ambulated to bathroom with min guard. No LOB.  Pt demonstrates good  safety.  Pt has reachers at home for adls, but family will also assist.  Talked about long netted sponge on a handle if she wants more independence     Vision         Perception     Praxis      Pertinent Vitals/Pain Pain Assessment: 0-10 Pain Score: 7  Pain Location: L knee Pain Descriptors / Indicators: Aching;Sore Pain Intervention(s): Limited activity within patient's tolerance;Monitored during session;Premedicated before session;Repositioned;Ice applied     Hand Dominance Right   Extremity/Trunk Assessment Upper Extremity Assessment Upper Extremity Assessment: Overall WFL for tasks assessed           Communication Communication Communication: No difficulties   Cognition Arousal/Alertness: Awake/alert Behavior During Therapy: WFL for tasks assessed/performed                                   General Comments: sometimes loses train of thought   General Comments       Exercises     Shoulder Instructions      Home Living Family/patient expects to be discharged to:: Private residence Living Arrangements: Spouse/significant other Available Help at Discharge: Family Type of Home: House             Bathroom Shower/Tub: Walk-in shower         Home Equipment: Bedside commode   Additional Comments: hasn't been  using BSC      Prior Functioning/Environment Level of Independence: Independent                 OT Problem List: Decreased strength;Decreased activity tolerance;Decreased knowledge of use of DME or AE;Pain      OT Treatment/Interventions: Self-care/ADL training;DME and/or AE instruction;Patient/family education    OT Goals(Current goals can be found in the care plan section) Acute Rehab OT Goals Patient Stated Goal: get back to walking in woods OT Goal Formulation: With patient Time For Goal Achievement: 06/26/16 Potential to Achieve Goals: Good ADL Goals Pt Will Transfer to Toilet: with min guard  assist;ambulating;bedside commode Pt Will Perform Tub/Shower Transfer: with min guard assist;ambulating;3 in 1;Shower transfer  OT Frequency: Min 2X/week   Barriers to D/C:            Co-evaluation              End of Session Equipment Utilized During Treatment: Gait belt  Activity Tolerance: Patient tolerated treatment well Patient left: in bed;with call bell/phone within reach  OT Visit Diagnosis: Pain Pain - Right/Left: Left Pain - part of body: Knee                Time: 8546-2703 OT Time Calculation (min): 21 min Charges:  OT General Charges $OT Visit: 1 Procedure OT Evaluation $OT Eval Low Complexity: 1 Procedure G-Codes:     Lesle Chris, OTR/L [phone] 06/19/2016  Saia Derossett 06/19/2016, 9:57 AM

## 2016-06-20 DIAGNOSIS — E669 Obesity, unspecified: Secondary | ICD-10-CM | POA: Diagnosis present

## 2016-06-20 LAB — CBC
HCT: 29 % — ABNORMAL LOW (ref 36.0–46.0)
HEMOGLOBIN: 9.2 g/dL — AB (ref 12.0–15.0)
MCH: 25.8 pg — AB (ref 26.0–34.0)
MCHC: 31.7 g/dL (ref 30.0–36.0)
MCV: 81.5 fL (ref 78.0–100.0)
Platelets: 193 10*3/uL (ref 150–400)
RBC: 3.56 MIL/uL — AB (ref 3.87–5.11)
RDW: 14.4 % (ref 11.5–15.5)
WBC: 8.6 10*3/uL (ref 4.0–10.5)

## 2016-06-20 LAB — BASIC METABOLIC PANEL
Anion gap: 6 (ref 5–15)
BUN: 13 mg/dL (ref 6–20)
CHLORIDE: 109 mmol/L (ref 101–111)
CO2: 25 mmol/L (ref 22–32)
CREATININE: 0.56 mg/dL (ref 0.44–1.00)
Calcium: 8.6 mg/dL — ABNORMAL LOW (ref 8.9–10.3)
GFR calc Af Amer: >60 mL/min (ref >60)
GFR calc non Af Amer: >60 mL/min (ref >60)
GLUCOSE: 126 mg/dL — AB (ref 65–99)
POTASSIUM: 3.8 mmol/L (ref 3.5–5.1)
SODIUM: 140 mmol/L (ref 135–145)

## 2016-06-20 LAB — GLUCOSE, CAPILLARY
GLUCOSE-CAPILLARY: 145 mg/dL — AB (ref 65–99)
GLUCOSE-CAPILLARY: 153 mg/dL — AB (ref 65–99)
Glucose-Capillary: 137 mg/dL — ABNORMAL HIGH (ref 65–99)
Glucose-Capillary: 190 mg/dL — ABNORMAL HIGH (ref 65–99)

## 2016-06-20 MED ORDER — HYDROMORPHONE HCL 1 MG/ML IJ SOLN
0.5000 mg | INTRAMUSCULAR | Status: DC | PRN
  Administered 2016-06-20: 10:00:00 1 mg via INTRAVENOUS
  Filled 2016-06-20: qty 1

## 2016-06-20 MED ORDER — ONDANSETRON HCL 4 MG PO TABS: 4.0000 mg | ORAL_TABLET | Freq: Four times a day (QID) | ORAL | 0 refills | Status: DC | PRN

## 2016-06-20 NOTE — Progress Notes (Signed)
     Subjective: 2 Days Post-Op Procedure(s) (LRB): LEFT TOTAL KNEE ARTHROPLASTY (Left)   Patient reports pain as mild, pain controlled. Little nausea which Dr. Alvan Dame mentions to her that we will give her a Zofran Rx, but also to stay on MiraLax and colace to help with preventing constipation. Otherwise no events throughout the night.  Ready to be discharged home.   Objective:   VITALS:   Vitals:   06/19/16 2249 06/20/16 0415  BP: 124/65 (!) 105/46  Pulse: 72 70  Resp: 16 18  Temp: 98 F (36.7 C) 98.6 F (37 C)    Dorsiflexion/Plantar flexion intact Incision: dressing C/D/I No cellulitis present Compartment soft  LABS  Recent Labs  06/19/16 0416 06/20/16 0409  HGB 10.4* 9.2*  HCT 32.6* 29.0*  WBC 9.4 8.6  PLT 201 193     Recent Labs  06/19/16 0416 06/20/16 0409  NA 138 140  K 4.1 3.8  BUN 9 13  CREATININE 0.52 0.56  GLUCOSE 145* 126*     Assessment/Plan: 2 Days Post-Op Procedure(s) (LRB): LEFT TOTAL KNEE ARTHROPLASTY (Left)   Up with therapy Discharge home Follow up in 2 weeks at [facility]. Follow up with OLIN,Jaclin Finks D in 2 weeks.  Contact information:  [facility] [address] [place] [phone]    Obese (BMI 30-39.9) Estimated body mass index is 31.41 kg/m as calculated from the following:   Height as of this encounter: 5\' 4"  (1.626 m).   Weight as of this encounter: 83 kg (183 lb). Patient also counseled that weight may inhibit the healing process Patient counseled that losing weight will help with future health issues       West Pugh. Diedra Sinor   PAC  06/20/2016, 7:22 AM

## 2016-06-20 NOTE — Progress Notes (Signed)
   06/20/16 1400  PT Visit Information  Last PT Received On 06/20/16  Assistance Needed +1  History of Present Illness s/p L TKA, h/o ADD, s/p back sx, concussion, imbalance  Subjective Data  Subjective Pt reports increased pain in L knee this afternoon.  Pt received pain meds and had ice in place on arrival.  Pt agreeable to attempt mobility.  Pt ambulated in hallway however reported 9/10 pain and felt unable to attempt steps at this time.  Precautions  Precautions Knee;Fall  Restrictions  Other Position/Activity Restrictions WBAT  Pain Assessment  Pain Assessment 0-10  Pain Score 9  Pain Location L knee  Pain Descriptors / Indicators Aching;Sore  Pain Intervention(s) Limited activity within patient's tolerance;Monitored during session;Repositioned;Ice applied;Premedicated before session  Cognition  Arousal/Alertness Awake/alert  Behavior During Therapy WFL for tasks assessed/performed  Overall Cognitive Status Within Functional Limits for tasks assessed  Bed Mobility  Overal bed mobility Needs Assistance  Bed Mobility Supine to Sit;Sit to Supine  Supine to sit Supervision  Sit to supine Supervision  General bed mobility comments verbal cues for technique and self assist  Transfers  Overall transfer level Needs assistance  Equipment used Rolling walker (2 wheeled)  Transfers Sit to/from Stand  Sit to Stand Min guard  General transfer comment min/guard for safety, verbal cues for UE and LE positioning  Ambulation/Gait  Ambulation/Gait assistance Min guard  Ambulation Distance (Feet) 80 Feet  Assistive device Rolling walker (2 wheeled)  Gait Pattern/deviations Step-to pattern;Antalgic;Decreased stance time - left  General Gait Details verbal cues for sequence, RW positioning, posture, step length, pt reports increase in pain to 9/10 and unable to attempt steps  Total Joint Exercises  Quad Sets AROM;Both;10 reps  Heel Slides AAROM;Left;10 reps;Supine  PT - End of Session   Activity Tolerance Patient limited by pain  Patient left in bed;with call bell/phone within reach;with family/visitor present  Nurse Communication (notified of pain)  PT - Assessment/Plan  PT Plan Current plan remains appropriate  PT Visit Diagnosis Other abnormalities of gait and mobility (R26.89);Pain  Pain - Right/Left Left  Pain - part of body Knee  PT Frequency (ACUTE ONLY) 7X/week  Follow Up Recommendations Outpatient PT  PT equipment None recommended by PT  AM-PAC PT "6 Clicks" Daily Activity Outcome Measure  Difficulty turning over in bed (including adjusting bedclothes, sheets and blankets)? 4  Difficulty moving from lying on back to sitting on the side of the bed?  3  Difficulty sitting down on and standing up from a chair with arms (e.g., wheelchair, bedside commode, etc,.)? 3  Help needed moving to and from a bed to chair (including a wheelchair)? 3  Help needed walking in hospital room? 3  Help needed climbing 3-5 steps with a railing?  2  6 Click Score 18  Mobility G Code  CK  PT Goal Progression  Progress towards PT goals Progressing toward goals  PT Time Calculation  PT Start Time (ACUTE ONLY) 1355  PT Stop Time (ACUTE ONLY) 1418  PT Time Calculation (min) (ACUTE ONLY) 23 min  PT General Charges  $$ ACUTE PT VISIT 1 Procedure  PT Treatments  $Gait Training 8-22 mins   Carmelia Bake, PT, DPT 06/20/2016 Pager: [phone]

## 2016-06-20 NOTE — Progress Notes (Signed)
Pt. placed on CPAP for h/s with small nasal mask, humidity filled, placed 2 lpm Oxygen into circuit, tolerating well, Daughter remains at bedside.

## 2016-06-20 NOTE — Progress Notes (Signed)
Physical Therapy Treatment Patient Details Name: Destiny Shaw MRN: [id number] DOB: [birth year] Today's Date: 06/20/2016    History of Present Illness s/p L TKA, h/o ADD, s/p back sx, concussion, imbalance    PT Comments    Pt ambulated short distance in hallway.  Session limited by nausea and emesis.  Follow Up Recommendations  Outpatient PT     Equipment Recommendations  None recommended by PT    Recommendations for Other Services       Precautions / Restrictions Precautions Precautions: Knee;Fall Restrictions Other Position/Activity Restrictions: WBAT    Mobility  Bed Mobility Overal bed mobility: Needs Assistance Bed Mobility: Supine to Sit;Sit to Supine     Supine to sit: Min guard Sit to supine: Min guard   General bed mobility comments: verbal cues for technique and self assist  Transfers Overall transfer level: Needs assistance Equipment used: Rolling walker (2 wheeled) Transfers: Sit to/from Stand Sit to Stand: Min guard         General transfer comment: min/guard for safety, verbal cues for UE and LE positioning  Ambulation/Gait Ambulation/Gait assistance: Min guard Ambulation Distance (Feet): 40 Feet Assistive device: Rolling walker (2 wheeled) Gait Pattern/deviations: Step-to pattern;Antalgic;Decreased stance time - left     General Gait Details: verbal cues for sequence, RW positinoing, posture, step length, distance limited by nausea and urge to use bathroom, pt vomited once in bathroom, assisted back to bed   Stairs            Wheelchair Mobility    Modified Rankin (Stroke Patients Only)       Balance                                            Cognition Arousal/Alertness: Awake/alert Behavior During Therapy: WFL for tasks assessed/performed Overall Cognitive Status: Within Functional Limits for tasks assessed                                        Exercises      General Comments        Pertinent Vitals/Pain Pain Assessment: 0-10 Pain Score: 7  Pain Location: L knee Pain Descriptors / Indicators: Aching;Sore Pain Intervention(s): Limited activity within patient's tolerance;Monitored during session;Ice applied;Repositioned    Home Living                      Prior Function            PT Goals (current goals can now be found in the care plan section) Progress towards PT goals: Progressing toward goals    Frequency    7X/week      PT Plan Current plan remains appropriate    Co-evaluation             End of Session   Activity Tolerance: Other (comment);Patient limited by pain (nausea/vomiting) Patient left: in bed;with call bell/phone within reach Nurse Communication: Mobility status PT Visit Diagnosis: Other abnormalities of gait and mobility (R26.89);Pain Pain - Right/Left: Left Pain - part of body: Knee     Time: 8786-7672 PT Time Calculation (min) (ACUTE ONLY): 16 min  Charges:  $Gait Training: 8-22 mins                    G Codes:  Carmelia Bake, PT, DPT 06/20/2016 Pager: [phone]    York Ram E 06/20/2016, 12:26 PM

## 2016-06-20 NOTE — Progress Notes (Signed)
1430  Patient refused to do stairs with PT, husband states she cannot get up the front steps  M Babish PA  Paged  D Mateo Flow RN

## 2016-06-20 NOTE — Progress Notes (Signed)
Occupational Therapy Treatment Patient Details Name: MARYKATHERINE SHERWOOD MRN: [id number] DOB: 1950/12/10 Today's Date: 06/20/2016    History of present illness s/p L TKA, h/o ADD, s/p back sx, concussion, imbalance   OT comments  Performed toilet transfer.  Pt limited by nausea/vomiting this am and still doesn't feel completely settled  Follow Up Recommendations  No OT follow up;Supervision/Assistance - 24 hour    Equipment Recommendations  3 in 1 bedside commode    Recommendations for Other Services      Precautions / Restrictions Precautions Precautions: Knee;Fall Restrictions Other Position/Activity Restrictions: WBAT       Mobility Bed Mobility Overal bed mobility: Needs Assistance Bed Mobility: Supine to Sit;Sit to Supine     Supine to sit: Min guard Sit to supine: Min guard   General bed mobility comments: verbal cues for technique and self assist  Transfers Overall transfer level: Needs assistance Equipment used: Rolling walker (2 wheeled) Transfers: Sit to/from Stand Sit to Stand: Min guard         General transfer comment: min/guard for safety, verbal cues for UE and LE positioning    Balance                                           ADL either performed or assessed with clinical judgement   ADL                           Toilet Transfer: Min guard;Ambulation;BSC;RW   Toileting- Water quality scientist and Hygiene: Min guard;Sit to/from stand         General ADL Comments: pt has had nausea and vomiting this am.  Ambulated to bathroom without nausea and used commode. Pt not up to practicing shower transfer at this time.  Demonstrated this and gave her a handout.     Vision       Perception     Praxis      Cognition Arousal/Alertness: Awake/alert Behavior During Therapy: WFL for tasks assessed/performed Overall Cognitive Status: Within Functional Limits for tasks assessed                                          Exercises     Shoulder Instructions       General Comments      Pertinent Vitals/ Pain       Pain Assessment: 0-10 Pain Score: 7  Pain Location: L knee Pain Descriptors / Indicators: Aching;Sore Pain Intervention(s): Limited activity within patient's tolerance;Monitored during session;Ice applied;Repositioned  Home Living                                          Prior Functioning/Environment              Frequency  Min 2X/week        Progress Toward Goals  OT Goals(current goals can now be found in the care plan section)  Progress towards OT goals: Progressing toward goals     Plan Discharge plan remains appropriate    Co-evaluation                 End of Session  OT Visit Diagnosis: Pain Pain - Right/Left: Left Pain - part of body: Knee   Activity Tolerance Patient limited by fatigue   Patient Left in bed;with call bell/phone within reach;with family/visitor present   Nurse Communication          Time: 2094-7096 OT Time Calculation (min): 13 min  Charges: OT General Charges $OT Visit: 1 Procedure OT Treatments $Self Care/Home Management : 8-22 mins  Lesle Chris, OTR/L [phone] 06/20/2016   Remy Dia 06/20/2016, 12:38 PM

## 2016-06-20 NOTE — Progress Notes (Signed)
OT Cancellation Note  Patient Details Name: SVETLANA BAGBY MRN: [id number] DOB: 10/12/1950   Cancelled Treatment:    Reason Eval/Treat Not Completed: Other (comment).  Pt is limited by nausea this am.  Ioannis Schuh 06/20/2016, 11:12 AM  Lesle Chris, OTR/L [phone] 06/20/2016

## 2016-06-21 LAB — GLUCOSE, CAPILLARY
GLUCOSE-CAPILLARY: 144 mg/dL — AB (ref 65–99)
Glucose-Capillary: 117 mg/dL — ABNORMAL HIGH (ref 65–99)

## 2016-06-21 NOTE — Progress Notes (Signed)
Physical Therapy Treatment Patient Details Name: Destiny Shaw MRN: [id number] DOB: 1951-02-11 Today's Date: 06/21/2016    History of Present Illness s/p L TKA, h/o ADD, s/p back sx, concussion, imbalance    PT Comments    Pt ambulated in hallway and practiced safe stair technique with spouse present.  Pt reports pain much more tolerable today.  Pt also performed LE exercises.  Pt provided with HEP and stair handouts.  Pt and spouse had no further questions and feels ready for d/c home.   Follow Up Recommendations  Outpatient PT     Equipment Recommendations  None recommended by PT    Recommendations for Other Services       Precautions / Restrictions Precautions Precautions: Knee;Fall Restrictions Other Position/Activity Restrictions: WBAT    Mobility  Bed Mobility Overal bed mobility: Needs Assistance Bed Mobility: Supine to Sit;Sit to Supine     Supine to sit: Supervision Sit to supine: Supervision   General bed mobility comments: verbal cues for technique and self assist  Transfers Overall transfer level: Needs assistance Equipment used: Rolling walker (2 wheeled) Transfers: Sit to/from Stand Sit to Stand: Min guard         General transfer comment: min/guard for safety, verbal cues for UE and LE positioning  Ambulation/Gait Ambulation/Gait assistance: Min guard Ambulation Distance (Feet): 80 Feet Assistive device: Rolling walker (2 wheeled) Gait Pattern/deviations: Step-to pattern;Antalgic;Decreased stance time - left     General Gait Details: verbal cues for sequence, RW positioning, posture, step length   Stairs Stairs: Yes   Stair Management: Step to pattern;Backwards;With walker Number of Stairs: 2 General stair comments: verbal cues for sequence, safety, RW positioning, spouse present and held RW, performed twice, handout provided  Wheelchair Mobility    Modified Rankin (Stroke Patients Only)       Balance                                             Cognition Arousal/Alertness: Awake/alert Behavior During Therapy: WFL for tasks assessed/performed Overall Cognitive Status: Within Functional Limits for tasks assessed                                        Exercises Total Joint Exercises Ankle Circles/Pumps: AROM;Both;10 reps Quad Sets: AROM;Both;10 reps Short Arc Quad: 10 reps;Left;AAROM Heel Slides: AAROM;Left;10 reps;Supine Hip ABduction/ADduction: AROM;Left;10 reps Straight Leg Raises: AAROM;Left;10 reps    General Comments        Pertinent Vitals/Pain Pain Assessment: 0-10 Pain Score: 5  Pain Location: L knee Pain Descriptors / Indicators: Sore Pain Intervention(s): Limited activity within patient's tolerance;Monitored during session;Repositioned;Ice applied    Home Living                      Prior Function            PT Goals (current goals can now be found in the care plan section) Acute Rehab PT Goals Patient Stated Goal: get back to walking in woods Progress towards PT goals: Progressing toward goals    Frequency    7X/week      PT Plan Current plan remains appropriate    Co-evaluation             End of Session   Activity Tolerance:  Patient tolerated treatment well Patient left: in bed;with call bell/phone within reach;with family/visitor present   PT Visit Diagnosis: Other abnormalities of gait and mobility (R26.89);Pain Pain - Right/Left: Left Pain - part of body: Knee     Time: 1015-1040 PT Time Calculation (min) (ACUTE ONLY): 25 min  Charges:  $Gait Training: 8-22 mins $Therapeutic Exercise: 8-22 mins                    G Codes:       Carmelia Bake, PT, DPT 06/21/2016 Pager: [phone]    York Ram E 06/21/2016, 1:30 PM

## 2016-06-21 NOTE — Progress Notes (Signed)
     Subjective: 3 Days Post-Op Procedure(s) (LRB): LEFT TOTAL KNEE ARTHROPLASTY (Left)   Patient reports pain as mild, pain controlled. No events throughout the night.  Feels that she is much improved from yesterday.  Feels that she has done much better with PT as compared to yesterday.  Ready to be discharged home.   Objective:   VITALS:   Vitals:   06/20/16 2158 06/21/16 0452  BP: (!) 142/67 (!) 159/79  Pulse: 89 97  Resp: 18 17  Temp: 98.8 F (37.1 C) 97.5 F (36.4 C)    Dorsiflexion/Plantar flexion intact Incision: dressing C/D/I No cellulitis present Compartment soft  LABS  Recent Labs  06/19/16 0416 06/20/16 0409  HGB 10.4* 9.2*  HCT 32.6* 29.0*  WBC 9.4 8.6  PLT 201 193     Recent Labs  06/19/16 0416 06/20/16 0409  NA 138 140  K 4.1 3.8  BUN 9 13  CREATININE 0.52 0.56  GLUCOSE 145* 126*     Assessment/Plan: 3 Days Post-Op Procedure(s) (LRB): LEFT TOTAL KNEE ARTHROPLASTY (Left)   Up with therapy Discharge home Follow up in 2 weeks at [facility]. Follow up with OLIN,Zariyah Stephens D in 2 weeks.  Contact information:  [facility] [address], [address] [place]   PAC  06/21/2016, 1:53 PM

## 2016-06-21 NOTE — Progress Notes (Signed)
   06/21/16 1000  OT Visit Information  Last OT Received On 06/21/16  Assistance Needed +1  History of Present Illness s/p L TKA, h/o ADD, s/p back sx, concussion, imbalance  Precautions  Precautions Knee;Fall  Pain Assessment  Pain Location L knee  Pain Descriptors / Indicators Sore  Cognition  Arousal/Alertness Awake/alert  Behavior During Therapy WFL for tasks assessed/performed  Overall Cognitive Status Within Functional Limits for tasks assessed  ADL  Toilet Transfer Min guard;Ambulation;BSC;RW  Toileting- Music therapist guard;Sit to/from stand  Tub/ IT consultant shower;Min guard;Ambulation;3 in 1;Rolling walker  General ADL Comments husband present and guarded her with transfer to shower. He wants to put grab bar in  Bed Mobility  Supine to sit Min assist  Sit to supine Min assist  General bed mobility comments assist for LLE. Educated on methods to assist herself with LLE.    Restrictions  Other Position/Activity Restrictions WBAT  Transfers  Equipment used Rolling walker (2 wheeled)  Sit to Stand Min guard  General transfer comment min/guard for safety, verbal cues for UE and LE positioning  OT - End of Session  Activity Tolerance Patient tolerated treatment well  Patient left in bed;with call bell/phone within reach;with bed alarm set  OT Assessment/Plan  OT Visit Diagnosis Pain  Pain - Right/Left Left  Pain - part of body Knee  OT Frequency (ACUTE ONLY) Min 2X/week  Follow Up Recommendations No OT follow up;Supervision/Assistance - 24 hour  OT Equipment 3 in 1 bedside commode  AM-PAC OT "6 Clicks" Daily Activity Outcome Measure  Help from another person eating meals? 4  Help from another person taking care of personal grooming? 3  Help from another person toileting, which includes using toliet, bedpan, or urinal? 3  Help from another person bathing (including washing, rinsing, drying)? 3  Help from another person to put on and  taking off regular upper body clothing? 3  Help from another person to put on and taking off regular lower body clothing? 2  6 Click Score 18  ADL G Code Conversion CK  Acute Rehab OT Goals  Patient Stated Goal get back to walking in woods  OT Time Calculation  OT Start Time (ACUTE ONLY) 0925  OT Stop Time (ACUTE ONLY) 0941  OT Time Calculation (min) 16 min  OT General Charges  $OT Visit 1 Procedure  OT Treatments  $Self Care/Home Management  8-22 mins  Lesle Chris, OTR/L [phone] 06/21/2016

## 2016-06-21 NOTE — Progress Notes (Signed)
Occupational Therapy Treatment Patient Details Name: Destiny Shaw MRN: [id number] DOB: 08-25-1950 Today's Date: 06/21/2016    History of present illness s/p L TKA, h/o ADD, s/p back sx, concussion, imbalance   OT comments  Completed bathroom transfers at min guard level; pt wants to practice shower again later  Follow Up Recommendations  No OT follow up;Supervision/Assistance - 24 hour    Equipment Recommendations  3 in 1 bedside commode    Recommendations for Other Services      Precautions / Restrictions Precautions Precautions: Knee;Fall Restrictions Weight Bearing Restrictions: No Other Position/Activity Restrictions: WBAT       Mobility Bed Mobility         Supine to sit: Min assist Sit to supine: Min assist   General bed mobility comments: assist for LLE  Transfers   Equipment used: Rolling walker (2 wheeled)   Sit to Stand: Min guard         General transfer comment: min/guard for safety, verbal cues for UE and LE positioning    Balance                                           ADL either performed or assessed with clinical judgement   ADL                           Toilet Transfer: Min guard;Ambulation;BSC;RW   Toileting- Water quality scientist and Hygiene: Min guard;Sit to/from stand   Tub/ Shower Transfer: Walk-in shower;Min guard;Ambulation;3 in 1;Rolling walker     General ADL Comments: practiced bathroom transfers.  Pt states she would like to practice shower transfer again when her husband is here     Vision       Perception     Praxis      Cognition Arousal/Alertness: Awake/alert Behavior During Therapy: WFL for tasks assessed/performed Overall Cognitive Status: Within Functional Limits for tasks assessed                                          Exercises     Shoulder Instructions       General Comments      Pertinent Vitals/ Pain       Pain Score: 4  Pain Location: L  knee Pain Descriptors / Indicators: Sore Pain Intervention(s): Limited activity within patient's tolerance;Monitored during session;Premedicated before session;Repositioned;Ice applied  Home Living                                          Prior Functioning/Environment              Frequency  Min 2X/week        Progress Toward Goals  OT Goals(current goals can now be found in the care plan section)  Progress towards OT goals:  (goals met but pt wants to practice shower again later)  Acute Rehab OT Goals Patient Stated Goal: get back to walking in woods  Plan      Co-evaluation                 End of Session    OT Visit Diagnosis: Pain Pain -  Right/Left: Left Pain - part of body: Knee   Activity Tolerance Patient tolerated treatment well   Patient Left in bed;with call bell/phone within reach;with bed alarm set   Nurse Communication          Time: [phone] OT Time Calculation (min): 20 min  Charges: OT General Charges $OT Visit: 1 Procedure OT Treatments $Self Care/Home Management : 8-22 mins  Lesle Chris, OTR/L [phone] 06/21/2016   [place] 06/21/2016, 8:49 AM

## 2016-06-26 NOTE — Discharge Summary (Signed)
Physician Discharge Summary  Patient ID: Destiny Shaw MRN: [id number] DOB/AGE: 67-Jun-1952 67 y.o.  Admit date: 06/18/2016 Discharge date: 06/21/2016   Procedures:  Procedure(s) (LRB): LEFT TOTAL KNEE ARTHROPLASTY (Left)  Attending Physician:  Dr. Paralee Cancel   Admission Diagnoses:   Left knee primary OA / pain  Discharge Diagnoses:  Principal Problem:   S/P left TKA Active Problems:   Obese  Past Medical History:  Diagnosis Date  . ADD (attention deficit disorder)   . Anemia   . Anginal pain ([place])    pt states has occas chest pain relates to indigestion; pt uses rest to relieve   . Anxiety   . Arthritis   . Concussion   . Depression   . Diabetes mellitus without complication ([place])   . Dizziness   . Fall   . GERD (gastroesophageal reflux disease)   . Headache   . History of urinary tract infection   . Hyperlipidemia   . Hypertension   . Hypothyroidism   . IBS (irritable bowel syndrome)   . Imbalance   . Numbness    right leg   . Numbness in both hands    comes and goes   . Pneumonia    last episode approx 1 year ago   . Sleep apnea   . Wears glasses     HPI:    Tarri Glenn, 66 y.o. female, has a history of pain and functional disability in the left knee due to arthritis and has failed non-surgical conservative treatments for greater than 12 weeks to include NSAID's and/or analgesics, corticosteriod injections and activity modification.  Onset of symptoms was gradual, starting >10 years ago with gradually worsening course since that time. The patient noted prior procedures on the knee to include  arthroscopy and menisectomy on the left knee(s).  Patient currently rates pain in the left knee(s) at 8 out of 10 with activity. Patient has night pain, worsening of pain with activity and weight bearing, pain that interferes with activities of daily living, pain with passive range of motion, crepitus and joint swelling.  Patient has evidence of periarticular osteophytes  and joint space narrowing by imaging studies.  There is no active infection.   Risks, benefits and expectations were discussed with the patient.  Risks including but not limited to the risk of anesthesia, blood clots, nerve damage, blood vessel damage, failure of the prosthesis, infection and up to and including death.  Patient understand the risks, benefits and expectations and wishes to proceed with surgery.  PCP: Kirk Ruths., MD   Discharged Condition: good  Hospital Course:  Patient underwent the above stated procedure on 06/18/2016. Patient tolerated the procedure well and brought to the recovery room in good condition and subsequently to the floor.  POD #1 BP: 117/90 ; Pulse: 85 ; Temp: 98.4 F (36.9 C) ; Resp: 16 Patient reports pain as moderate.  No events.  Reviewed operative findings and current plans.  Tough night but feeling a bit better this am. Neurovascular intact and incision: dressing C/D/I.  LABS  Basename    HGB     10.4  HCT     32.6   POD #2  BP: 105/46 ; Pulse: 70 ; Temp: 98.6 F (37 C) ; Resp: 18 Patient reports pain as mild, pain controlled. Little nausea which Dr. Alvan Dame mentions to her that we will give her a Zofran Rx, but also to stay on MiraLax and colace to help with preventing constipation. Otherwise no  events throughout the night. Did not do well with stairs with PT, kept to allow to work with PT for safety at home issues. Dorsiflexion/plantar flexion intact, incision: dressing C/D/I, no cellulitis present and compartment soft.   LABS  Basename    HGB     9.2  HCT     29.0   POD #3  BP: 159/79 ; Pulse: 97 ; Temp: 97.5 F (36.4 C) ; Resp: 17 Patient reports pain as mild, pain controlled. No events throughout the night.  Feels that she is much improved from yesterday.  Feels that she has done much better with PT as compared to yesterday.  Ready to be discharged home. Dorsiflexion/plantar flexion intact, incision: dressing C/D/I, no cellulitis  present and compartment soft.   LABS   No new labs   Discharge Exam: General appearance: alert, cooperative and no distress Extremities: Homans sign is negative, no sign of DVT, no edema, redness or tenderness in the calves or thighs and no ulcers, gangrene or trophic changes  Disposition: Home with follow up in 2 weeks   Follow-up Information    Mauri Pole, MD. Schedule an appointment as soon as possible for a visit in 2 week(s).   Specialty:  Orthopedic Surgery Contact information: [address] [place] [address] [phone]           Discharge Instructions    Call MD / Call 911    Complete by:  As directed    If you experience chest pain or shortness of breath, CALL 911 and be transported to the hospital emergency room.  If you develope a fever above 101 F, pus (white drainage) or increased drainage or redness at the wound, or calf pain, call your surgeon's office.   Change dressing    Complete by:  As directed    Maintain surgical dressing until follow up in the clinic. If the edges start to pull up, may reinforce with tape. If the dressing is no longer working, may remove and cover with gauze and tape, but must keep the area dry and clean.  Call with any questions or concerns.   Constipation Prevention    Complete by:  As directed    Drink plenty of fluids.  Prune juice may be helpful.  You may use a stool softener, such as Colace (over the counter) 100 mg twice a day.  Use MiraLax (over the counter) for constipation as needed.   Diet - low sodium heart healthy    Complete by:  As directed    Discharge instructions    Complete by:  As directed    Maintain surgical dressing until follow up in the clinic. If the edges start to pull up, may reinforce with tape. If the dressing is no longer working, may remove and cover with gauze and tape, but must keep the area dry and clean.  Follow up in 2 [facility] at [facility]. Call with any questions  or concerns.   Increase activity slowly as tolerated    Complete by:  As directed    Weight bearing as tolerated with assist device (walker, cane, etc) as directed, use it as long as suggested by your surgeon or therapist, typically at least 4-6 weeks.   TED hose    Complete by:  As directed    Use stockings (TED hose) for 2 weeks on both leg(s).  You may remove them at night for sleeping.      Allergies as of 06/21/2016  Reactions   Tessalon [benzonatate] Hives   Latex Rash      Medication List    STOP taking these medications   aspirin EC 81 MG tablet Replaced by:  aspirin 81 MG chewable tablet   HYDROcodone-acetaminophen 5-325 MG tablet Commonly known as:  NORCO/VICODIN Replaced by:  HYDROcodone-acetaminophen 7.5-325 MG tablet   ibuprofen 200 MG tablet Commonly known as:  ADVIL,MOTRIN   traMADol 50 MG tablet Commonly known as:  ULTRAM     TAKE these medications   acidophilus Caps capsule Take 1 capsule by mouth every other day.   amphetamine-dextroamphetamine 10 MG tablet Commonly known as:  ADDERALL Take 10 mg by mouth daily as needed (for focus).   aspirin 81 MG chewable tablet Chew 1 tablet (81 mg total) by mouth 2 (two) times daily. Take for 4 weeks. Replaces:  aspirin EC 81 MG tablet   chlorhexidine 0.12 % solution Commonly known as:  PERIDEX Use as directed 15 mLs in the mouth or throat daily as needed (mouth sores). Reported on 09/01/2015   docusate sodium 100 MG capsule Commonly known as:  COLACE Take 1 capsule (100 mg total) by mouth 2 (two) times daily. What changed:  when to take this  reasons to take this   ferrous sulfate 325 (65 FE) MG tablet Commonly known as:  FERROUSUL Take 1 tablet (325 mg total) by mouth 3 (three) times daily with meals.   FLUoxetine 10 MG capsule Commonly known as:  PROZAC Take 10 mg by mouth daily.   GERITOL PO Take 1 tablet by mouth every other day.   HYDROcodone-acetaminophen 7.5-325 MG tablet Commonly  known as:  NORCO Take 1-2 tablets by mouth every 4 (four) hours as needed for moderate pain or severe pain. Replaces:  HYDROcodone-acetaminophen 5-325 MG tablet   LORazepam 1 MG tablet Commonly known as:  ATIVAN Take 1 mg by mouth at bedtime.   metFORMIN 500 MG tablet Commonly known as:  GLUCOPHAGE Take 1,000 mg by mouth 2 (two) times daily.   methocarbamol 500 MG tablet Commonly known as:  ROBAXIN Take 1 tablet (500 mg total) by mouth every 6 (six) hours as needed for muscle spasms.   ondansetron 4 MG tablet Commonly known as:  ZOFRAN Take 1 tablet (4 mg total) by mouth every 6 (six) hours as needed for nausea.   polyethylene glycol packet Commonly known as:  MIRALAX / GLYCOLAX Take 17 g by mouth 2 (two) times daily.   propranolol 10 MG tablet Commonly known as:  INDERAL Take 10 mg by mouth 2 (two) times daily as needed (knee pain).   senna-docusate 8.6-50 MG tablet Commonly known as:  Senokot-S Take 1 tablet by mouth daily as needed for mild constipation.   zolpidem 10 MG tablet Commonly known as:  AMBIEN Take 10 mg by mouth at bedtime as needed for sleep.        Signed: [place]. Brandom Kerwin   PA-C  06/26/2016, 9:58 AM

## 2016-07-30 NOTE — Addendum Note (Signed)
Addendum  created 07/30/16 1338 by Myrtie Soman, MD   Sign clinical note

## 2016-07-30 NOTE — Anesthesia Postprocedure Evaluation (Signed)
Anesthesia Post Note  Patient: Destiny Shaw  Procedure(s) Performed: Procedure(s) (LRB): LEFT TOTAL KNEE ARTHROPLASTY (Left)     Anesthesia Post Evaluation  Last Vitals:  Vitals:   06/20/16 2158 06/21/16 0452  BP: (!) 142/67 (!) 159/79  Pulse: 89 97  Resp: 18 17  Temp: 37.1 C 36.4 C    Last Pain:  Vitals:   06/21/16 1420  TempSrc:   PainSc: 4                  Brucha Ahlquist S

## 2016-09-20 ENCOUNTER — Ambulatory Visit: Payer: Medicare Other | Attending: Neurology

## 2016-09-20 DIAGNOSIS — R413 Other amnesia: Secondary | ICD-10-CM | POA: Insufficient documentation

## 2016-09-20 DIAGNOSIS — G4733 Obstructive sleep apnea (adult) (pediatric): Secondary | ICD-10-CM | POA: Diagnosis not present

## 2016-09-20 DIAGNOSIS — G478 Other sleep disorders: Secondary | ICD-10-CM | POA: Diagnosis not present

## 2016-09-20 DIAGNOSIS — R0683 Snoring: Secondary | ICD-10-CM | POA: Diagnosis present

## 2016-09-20 DIAGNOSIS — R51 Headache: Secondary | ICD-10-CM | POA: Insufficient documentation

## 2017-03-19 ENCOUNTER — Other Ambulatory Visit: Payer: Self-pay | Admitting: Internal Medicine

## 2017-03-19 DIAGNOSIS — Z1231 Encounter for screening mammogram for malignant neoplasm of breast: Secondary | ICD-10-CM

## 2017-04-02 ENCOUNTER — Ambulatory Visit
Admission: RE | Admit: 2017-04-02 | Discharge: 2017-04-02 | Disposition: A | Discharge status: Home / Self Care | Payer: Medicare Other | Source: Ambulatory Visit | Attending: Internal Medicine | Admitting: Internal Medicine

## 2017-04-02 ENCOUNTER — Other Ambulatory Visit (HOSPITAL_COMMUNITY): Payer: Self-pay | Admitting: Neurology

## 2017-04-02 DIAGNOSIS — Z1231 Encounter for screening mammogram for malignant neoplasm of breast: Secondary | ICD-10-CM

## 2017-04-02 DIAGNOSIS — R413 Other amnesia: Secondary | ICD-10-CM

## 2017-04-12 ENCOUNTER — Ambulatory Visit (HOSPITAL_COMMUNITY): Payer: Medicare Other

## 2017-04-13 ENCOUNTER — Ambulatory Visit (HOSPITAL_COMMUNITY)
Admission: RE | Admit: 2017-04-13 | Discharge: 2017-04-13 | Disposition: A | Discharge status: Home / Self Care | Payer: Medicare Other | Source: Ambulatory Visit | Attending: Neurology | Admitting: Neurology

## 2017-04-13 DIAGNOSIS — R413 Other amnesia: Secondary | ICD-10-CM | POA: Diagnosis present

## 2017-09-24 ENCOUNTER — Other Ambulatory Visit: Payer: Self-pay

## 2017-09-24 ENCOUNTER — Ambulatory Visit: Payer: Medicare Other | Attending: Orthopedic Surgery

## 2017-09-24 DIAGNOSIS — R42 Dizziness and giddiness: Secondary | ICD-10-CM | POA: Insufficient documentation

## 2017-09-24 DIAGNOSIS — Z9181 History of falling: Secondary | ICD-10-CM | POA: Insufficient documentation

## 2017-09-24 DIAGNOSIS — M6281 Muscle weakness (generalized): Secondary | ICD-10-CM | POA: Insufficient documentation

## 2017-09-24 DIAGNOSIS — M545 Low back pain: Secondary | ICD-10-CM | POA: Diagnosis not present

## 2017-09-24 DIAGNOSIS — M5416 Radiculopathy, lumbar region: Secondary | ICD-10-CM | POA: Insufficient documentation

## 2017-09-24 DIAGNOSIS — G8929 Other chronic pain: Secondary | ICD-10-CM | POA: Diagnosis present

## 2017-09-24 DIAGNOSIS — M25562 Pain in left knee: Secondary | ICD-10-CM | POA: Diagnosis present

## 2017-09-24 NOTE — Therapy (Signed)
[facility] [address], [place], [address] Phone: [phone]   Fax:  [phone]  Physical Therapy Evaluation  Patient Details  Name: Destiny Shaw MRN: [id number] Date of Birth: 1950-07-20 Referring Provider: Paralee Cancel, MD   Encounter Date: 09/24/2017  PT End of Session - 09/24/17 1436    Visit Number  1    Number of Visits  17    Date for PT Re-Evaluation  11/21/17    PT Start Time  8144    PT Stop Time  1557    PT Time Calculation (min)  81 min    Equipment Utilized During Treatment  Gait belt    Activity Tolerance  Patient tolerated treatment well    Behavior During Therapy  [facility] for tasks assessed/performed       Past Medical History:  Diagnosis Date  . ADD (attention deficit disorder)   . Anemia   . Anginal pain ([place])    pt states has occas chest pain relates to indigestion; pt uses rest to relieve   . Anxiety   . Arthritis   . Concussion   . Depression   . Diabetes mellitus without complication ([place])   . Dizziness   . Fall   . GERD (gastroesophageal reflux disease)   . Headache   . History of urinary tract infection   . Hyperlipidemia   . Hypertension   . Hypothyroidism   . IBS (irritable bowel syndrome)   . Imbalance   . Numbness    right leg   . Numbness in both hands    comes and goes   . Pneumonia    last episode approx 1 year ago   . Sleep apnea   . Wears glasses     Past Surgical History:  Procedure Laterality Date  . BREAST CYST ASPIRATION Left 1980's   neg  . BREAST CYST EXCISION Right 1980's   neg  . BREAST LUMPECTOMY Right   . BREAST SURGERY    . CARPAL TUNNEL RELEASE    . CESAREAN SECTION     times 2  . COLONOSCOPY WITH PROPOFOL N/A 05/11/2015   Procedure: COLONOSCOPY WITH PROPOFOL;  Surgeon: Manya Silvas, MD;  Location: [facility] ENDOSCOPY;  Service: Endoscopy;  Laterality: N/A;  . DE QUERVAIN'S RELEASE Right   . ESOPHAGOGASTRODUODENOSCOPY (EGD) WITH  PROPOFOL N/A 05/11/2015   Procedure: ESOPHAGOGASTRODUODENOSCOPY (EGD) WITH PROPOFOL;  Surgeon: Manya Silvas, MD;  Location: [facility] ENDOSCOPY;  Service: Endoscopy;  Laterality: N/A;  . EYE SURGERY     laser surgery bilat   . HERNIA REPAIR    . KNEE ARTHROSCOPY    . LUMBAR LAMINECTOMY/DECOMPRESSION MICRODISCECTOMY Bilateral 09/14/2015   Procedure: MICRO LUMBAR BILATERAL DECOMPRESSION L4 - L5;  Surgeon: Susa Day, MD;  Location: [facility] ORS;  Service: Orthopedics;  Laterality: Bilateral;  . REDUCTION MAMMAPLASTY Bilateral 1980  . RHINOPLASTY    . TONSILLECTOMY    . TOTAL KNEE ARTHROPLASTY Left 06/18/2016   Procedure: LEFT TOTAL KNEE ARTHROPLASTY;  Surgeon: Paralee Cancel, MD;  Location: [facility] ORS;  Service: Orthopedics;  Laterality: Left;  Adductor Block  . TUBAL LIGATION    . UVULOPALATOPHARYNGOPLASTY      There were no vitals filed for this visit.   Subjective Assessment - 09/24/17 1448    Subjective  Back pain: 4/10 back pain currently (pt sitting on a chair; 6/10 back pain in the evening), 5/10 back pain at most for the past 3 months.  L knee pain (quadriceps tendon, lateral patella): 4/10 currently (pt sitting on a chair), 6/10 L knee pain at worst for the past 3 months.     Pertinent History  Low back pain. Pt states having back surgery about 2-3 years ago. After she had her L TKA, her back started aggravating her due to her walking. Pt also fell about 4 weeks ago onto her L knee. Dr. Alvan Dame checked out her L knee. L knee was fine but has some inflammation.  Pt tries to keep it iced. Pt still recovering for her L TKA but the fall set her back.   Her L knee surgery was last year.  L knee still bothers her a lot.  Had surgery for her back before her L knee surgery. Was doing well until her L knee surgery. The fall made it worse. Feels debilitating. Also has a hard time getting into and out of the car mainly due to her L knee.   Pt fell due to her L knee bucking on her. Pt was trying to pick  something up from the side of the couch.  Also has a hard time getting up from the floor.  Pt states having bladder accidents since after her fall (pt was recommended to tell her MD about it).  Bowel problems since taking medications (was constapated, but after taking medications, pt had diarrhea which was difficult to control. Getting out of the metformin fixed the bowel issue).  Pt states tingling and numbness L lateral LE along the L5 dermatome.  Denies saddle anesthesia.  Pt states that her back problem is mainly on her L side.  Pt landed on her L knee when she fell.  No other falls within the last 6 months. Pt also states being very dizzy since her L knee surgery.  Had PT treatement for her dizziness before which did not help.  The room feels like it is spinning when she gets dizzy.     Patient Stated Goals  Be better able to get into and out of her vehicle (midsized truck), into and out of chairs, be better able to roll in her bed with less L knee pain.    Currently in Pain?  Yes    Pain Score  4     Pain Location  -- back, L knee    Pain Orientation  Left    Pain Type  Chronic pain    Pain Onset  More than a month ago    Pain Frequency  Constant    Aggravating Factors   back pain: picking up something from the floor, turning, rolling over in bed; L knee pain: getting into and out of the car, walking for about 4-5 minutes.     Pain Relieving Factors  ice, recliner, CBD oil (for the knee)         [facility] PT Assessment - 09/24/17 1438      Assessment   Medical Diagnosis  Low back pain    Referring Provider  Paralee Cancel, MD    Onset Date/Surgical Date  09/05/17      Precautions   Precaution Comments  history of fall      Restrictions   Other Position/Activity Restrictions  no known weight bearing restrictions      Balance Screen   Has the patient fallen in the past 6 months  Yes    How many times?  1    Has the patient had a decrease in activity level because  of a fear of falling?    Yes    Is the patient reluctant to leave their home because of a fear of falling?   No      Prior Function   Vocation  Retired    [employer]  PLOF: better able to ambulate, get into and out of a car, negotiate stairs with less back and L knee pain.       Observation/Other Assessments   Observations  TUG: 16.05 seconds on average.     Focus on Therapeutic Outcomes (FOTO)   Back FOTO: 33    Modified [place]  48 %      Posture/Postural Control   Posture Comments  Bilaterally protracted shoulders and neck, R knee lower than L, slight R lateral shift      AROM   Right Knee Extension  -- WFL    Right Knee Flexion  -- WFL    Left Knee Extension  -20 with lateral knee pain; -11 degrees when hamstrings in slack    Left Knee Flexion  100 115 degrees AAROM    Lumbar Flexion  WFL with L knee joint pain    Lumbar Extension  reproduction of back pain. Dizziness which subsided when returning to neutral    Lumbar - Right Side [facility] with L > R back pain    Lumbar - Left Side [facility] wiht slight R posterior hip pain.     Lumbar - Right Rotation  limited performed in sitting    Lumbar - Left Rotation  limited with L low back pain performed in sitting      Strength   Right Hip Flexion  4-/5    Right Hip Extension  3+/5 seated manually resisted hip extension    Right Hip ABduction  4-/5 seated manually resisted clamshell isometrics    Left Hip Flexion  4-/5    Left Hip Extension  3+/5 seated manually resisted hip extension    Left Hip ABduction  3+/5 seated manually resisted clamshell isometrics    Right Knee Flexion  4+/5    Right Knee Extension  5/5    Left Knee Flexion  3+/5    Left Knee Extension  4/5 with medial and lateral patellar pain      Palpation   Palpation comment  Slight L posterior pelvic tilt compared to R. Decreased medial patellar mobility      Ambulation/Gait   Gait Comments  Antalgic, decreased stance R LE with R lateral lean during R LE stance phase, then  switches to L LE being antalgic at times                Objective measurements completed on examination: See above findings.    Back and  L knee pain has gotten worse since her fall. Fear of falling (keeps her from doing normal stuff)  Blood pressure is usually pretty good per pt. Blood pressure R arm sitting, mechanically taken: 157/59, HR 80   No known diagnosis of osteoporosis   TUG 16.77 sec, 15.71 sec, 15.69 seconds.  (16.05 seconds on average)   Pt states that when she was walking and turning to the L, L LE feels like it was going to get numb.   Going up steps bothers her L knee.    Pt was recommended to tell Dr. Alvan Dame about her bladder difficulties such as in the morning. Pt verbalized understanding.  Doctor's office was called. Left a message pertaining to bladder difficulty for  Dr. Alvan Dame through his receptionist. Return phone call requested. Clinic phone number provided.      Manual therapy   Seated medial glide L patellar grade 3- to promote mobility to decrease stiffness  Seated STM L vastus lateralis to help decrease tension to L patella.   Therapeutic exercise   Seated glute max squeeze 10x3 with 5 second holds  No back pain in sitting while activating glute max muscles  Reviewed and given as part of her HEP. Pt demonstrated and verbalized understanding.   Improved exercise technique, movement at target joints, use of target muscles after mod verbal, visual, tactile cues.    Patient is a 67 year old female who came to physical therapy secondary to low back pain. She also presents with L knee pain, altered gait pattern and posture, bilateral LE weakness, decreased patellar mobility, trunk weakness, limited L knee flexion and extension ROM and difficulty performing functional tasks such as walking, getting, into and out of the car, stair negotiation, and turning in bed due to back and L knee pain. Patient will benefit from skilled physical therapy services to  address the aforementioned deficits.     PT Education - 09/24/17 1556    Education Details  ther-ex, HEP, plan of care    Person(s) Educated  Patient    Methods  Explanation;Demonstration;Tactile cues;Verbal cues;Handout    Comprehension  Verbalized understanding;Returned demonstration       PT Short Term Goals - 09/24/17 1626      PT SHORT TERM GOAL #1   Title  Patient will be independent with her HEP to help decrease back and L knee pain and improve ability to perform functional tasks.     Time  3    Period  Weeks    Status  New    Target Date  10/17/17        PT Long Term Goals - 09/24/17 1631      PT LONG TERM GOAL #1   Title  Patient will have a decrease in back pain to 3/10 or less at worst to promote ability to ambulate, turn in bed, perform standing tasks with less pain.     Baseline  6/10 back pain at most (09/24/2017)    Time  8    Period  Weeks    Status  New    Target Date  11/21/17      PT LONG TERM GOAL #2   Title  Patient will have a decrease in L knee pain to 3/10 or less at worst to promote ability to ambulate, negotiate stairs, get into and out of a car more comfortably.     Baseline  6/10 L knee pain at most for the past 3 months (09/24/2017)    Time  8    Period  Weeks    Status  New    Target Date  11/21/17      PT LONG TERM GOAL #3   Title  Patient will improve TUG time to 12 seconds or less as a demonstration of improved functional mobility and balance.     Baseline  TUG no AD: 16.05 seconds on average (09/24/2017)    Time  8    Period  Weeks    Status  New    Target Date  11/21/17      PT LONG TERM GOAL #4   Title  Patient will improve her back FOTO score by at least 10 points as a demonstration of improved funtion.  Baseline  Back FOTO: 33 (09/24/2017)    Time  8    Period  Weeks    Status  New    Target Date  11/21/17      PT LONG TERM GOAL #5   Title  Patient will improve bilateral LE strength by at least 1/2 MMT grade to promote  ability to perform standing tasks.     Time  8    Period  Weeks    Status  New    Target Date  11/21/17      Additional Long Term Goals   Additional Long Term Goals  Yes      PT LONG TERM GOAL #6   Title  Patient will improve her Modified Oswestry Low Back pain disablity questionnaire by at least 10% as a demonstration of improved function.     Baseline  48% (09/24/2017)    Time  8    Period  Weeks    Status  New    Target Date  11/21/17             Plan - 09/24/17 1610    Clinical Impression Statement  Patient is a 67 year old female who came to physical therapy secondary to low back pain. She also presents with L knee pain, altered gait pattern and posture, bilateral LE weakness, decreased patellar mobility, trunk weakness, limited L knee flexion and extension ROM and difficulty performing functional tasks such as walking, getting, into and out of the car, stair negotiation, and turning in bed due to back and L knee pain. Patient will benefit from skilled physical therapy services to address the aforementioned deficits.     History and Personal Factors relevant to plan of care:  Chronic low back pain, L LE radiating symptoms, L knee pain, hx of fall, medical history, LE weakness, difficulty getting into and out of the car, stair negotiatin, bed mobility,  and walking due to back and L knee pain.     Clinical Presentation  Evolving    Clinical Presentation due to:  patient states pain is getting worse    Clinical Decision Making  Moderate    Rehab Potential  Fair    Clinical Impairments Affecting Rehab Potential  (-) chronicity of condition, multiple areas of pain, medical history, age; (+) motivated, husband support    PT Frequency  2x / week    PT Duration  8 weeks    PT Treatment/Interventions  Therapeutic activities;Therapeutic exercise;Balance training;Neuromuscular re-education;Patient/family education;Manual techniques;Dry needling;Aquatic Therapy;Electrical  Stimulation;Iontophoresis 4mg /ml Dexamethasone;Gait training    PT Next Visit Plan  hip and knee strengthening, core strengthening, patellar mobility, manual techniques, modalities PRN    Consulted and Agree with Plan of Care  Patient       Patient will benefit from skilled therapeutic intervention in order to improve the following deficits and impairments:  Pain, Improper body mechanics, Postural dysfunction, Dizziness, Decreased strength, Difficulty walking  Visit Diagnosis: Chronic bilateral low back pain, with sciatica presence unspecified - Plan: PT plan of care cert/re-cert  Radiculopathy, lumbar region - Plan: PT plan of care cert/re-cert  Muscle weakness (generalized) - Plan: PT plan of care cert/re-cert  Chronic pain of left knee - Plan: PT plan of care cert/re-cert  History of falling - Plan: PT plan of care cert/re-cert  Dizziness and giddiness - Plan: PT plan of care cert/re-cert     Problem List Patient Active Problem List   Diagnosis Date Noted  . Obese 06/20/2016  . Status  post right knee replacement 06/19/2016  . S/P left TKA 06/18/2016  . Spinal stenosis of lumbar region 09/14/2015    Thank you for your referral.   Joneen Boers PT, DPT  09/24/2017, 5:16 PM  [place] [address] [place], [address] Phone: [phone]   Fax:  [phone]  Name: JEANI FASSNACHT MRN: [id number] Date of Birth: 11/04/1950

## 2017-09-24 NOTE — Patient Instructions (Signed)
  Sitting on a chair   Squeeze your rear end muscles together.    Hold for 5 seconds   Repeat 10 times   Perform at least 3 sets daily.

## 2017-09-26 ENCOUNTER — Ambulatory Visit: Payer: Medicare Other

## 2017-10-01 ENCOUNTER — Ambulatory Visit: Payer: Medicare Other | Attending: Orthopedic Surgery

## 2017-10-01 DIAGNOSIS — M5416 Radiculopathy, lumbar region: Secondary | ICD-10-CM

## 2017-10-01 DIAGNOSIS — G8929 Other chronic pain: Secondary | ICD-10-CM | POA: Diagnosis present

## 2017-10-01 DIAGNOSIS — M25562 Pain in left knee: Secondary | ICD-10-CM | POA: Diagnosis present

## 2017-10-01 DIAGNOSIS — R41841 Cognitive communication deficit: Secondary | ICD-10-CM | POA: Diagnosis present

## 2017-10-01 DIAGNOSIS — Z9181 History of falling: Secondary | ICD-10-CM

## 2017-10-01 DIAGNOSIS — M6281 Muscle weakness (generalized): Secondary | ICD-10-CM | POA: Diagnosis present

## 2017-10-01 DIAGNOSIS — M545 Low back pain: Secondary | ICD-10-CM | POA: Insufficient documentation

## 2017-10-01 NOTE — Therapy (Signed)
[facility] [address], [place], [address] Phone: [phone]   Fax:  [phone]  Physical Therapy Treatment  Patient Details  Name: Destiny Shaw MRN: [id number] Date of Birth: [birth year] Referring Provider: Paralee Cancel, MD   Encounter Date: 10/01/2017  PT End of Session - 10/01/17 1304    Visit Number  2    Number of Visits  17    Date for PT Re-Evaluation  11/21/17    PT Start Time  1304    PT Stop Time  1401    PT Time Calculation (min)  57 min    Equipment Utilized During Treatment  Gait belt    Activity Tolerance  Patient tolerated treatment well    Behavior During Therapy  [facility] for tasks assessed/performed       Past Medical History:  Diagnosis Date  . ADD (attention deficit disorder)   . Anemia   . Anginal pain ([place])    pt states has occas chest pain relates to indigestion; pt uses rest to relieve   . Anxiety   . Arthritis   . Concussion   . Depression   . Diabetes mellitus without complication ([place])   . Dizziness   . Fall   . GERD (gastroesophageal reflux disease)   . Headache   . History of urinary tract infection   . Hyperlipidemia   . Hypertension   . Hypothyroidism   . IBS (irritable bowel syndrome)   . Imbalance   . Numbness    right leg   . Numbness in both hands    comes and goes   . Pneumonia    last episode approx 1 year ago   . Sleep apnea   . Wears glasses     Past Surgical History:  Procedure Laterality Date  . BREAST CYST ASPIRATION Left 1980's   neg  . BREAST CYST EXCISION Right 1980's   neg  . BREAST LUMPECTOMY Right   . BREAST SURGERY    . CARPAL TUNNEL RELEASE    . CESAREAN SECTION     times 2  . COLONOSCOPY WITH PROPOFOL N/A 05/11/2015   Procedure: COLONOSCOPY WITH PROPOFOL;  Surgeon: Manya Silvas, MD;  Location: [facility] ENDOSCOPY;  Service: Endoscopy;  Laterality: N/A;  . DE QUERVAIN'S RELEASE Right   . ESOPHAGOGASTRODUODENOSCOPY (EGD) WITH  PROPOFOL N/A 05/11/2015   Procedure: ESOPHAGOGASTRODUODENOSCOPY (EGD) WITH PROPOFOL;  Surgeon: Manya Silvas, MD;  Location: [facility] ENDOSCOPY;  Service: Endoscopy;  Laterality: N/A;  . EYE SURGERY     laser surgery bilat   . HERNIA REPAIR    . KNEE ARTHROSCOPY    . LUMBAR LAMINECTOMY/DECOMPRESSION MICRODISCECTOMY Bilateral 09/14/2015   Procedure: MICRO LUMBAR BILATERAL DECOMPRESSION L4 - L5;  Surgeon: Susa Day, MD;  Location: [facility] ORS;  Service: Orthopedics;  Laterality: Bilateral;  . REDUCTION MAMMAPLASTY Bilateral 1980  . RHINOPLASTY    . TONSILLECTOMY    . TOTAL KNEE ARTHROPLASTY Left 06/18/2016   Procedure: LEFT TOTAL KNEE ARTHROPLASTY;  Surgeon: Paralee Cancel, MD;  Location: [facility] ORS;  Service: Orthopedics;  Laterality: Left;  Adductor Block  . TUBAL LIGATION    . UVULOPALATOPHARYNGOPLASTY      There were no vitals filed for this visit.  Subjective Assessment - 10/01/17 1305    Subjective  Pt was putting on her clothes this morning, when she crosses her L leg over her R knee, to put on her shoe (L hip ER) her L leg bothers  her.   L knee also hurts when she lays down on her R side.   Back is not bad. Kind of the same.  3/10 back pain at rest (pt sitting),  6/10 when walking.  Bending over bothers her.     Pertinent History  Low back pain. Pt states having back surgery about 2-3 years ago. After she had her L TKA, her back started aggravating her due to her walking. Pt also fell about 4 weeks ago onto her L knee. Dr. Alvan Dame checked out her L knee. L knee was fine but has some inflammation.  Pt tries to keep it iced. Pt still recovering for her L TKA but the fall set her back.   Her L knee surgery was last year.  L knee still bothers her a lot.  Had surgery for her back before her L knee surgery. Was doing well until her L knee surgery. The fall made it worse. Feels debilitating. Also has a hard time getting into and out of the car mainly due to her L knee.   Pt fell due to her L knee bucking on  her. Pt was trying to pick something up from the side of the couch.  Also has a hard time getting up from the floor.  Pt states having bladder accidents since after her fall (pt was recommended to tell her MD about it).  Bowel problems since taking medications (was constapated, but after taking medications, pt had diarrhea which was difficult to control. Getting out of the metformin fixed the bowel issue).  Pt states tingling and numbness L lateral LE along the L5 dermatome.  Denies saddle anesthesia.  Pt states that her back problem is mainly on her L side.  Pt landed on her L knee when she fell.  No other falls within the last 6 months. Pt also states being very dizzy since her L knee surgery.  Had PT treatement for her dizziness before which did not help.  The room feels like it is spinning when she gets dizzy.     Patient Stated Goals  Be better able to get into and out of her vehicle (midsized truck), into and out of chairs, be better able to roll in her bed with less L knee pain.    Currently in Pain?  Yes    Pain Score  3     Pain Onset  More than a month ago                                PT Education - 10/01/17 1410    Education Details  ther-ex, HEP    Person(s) Educated  Patient    Methods  Explanation;Demonstration;Tactile cues;Verbal cues;Handout    Comprehension  Returned demonstration;Verbalized understanding        Objectives   Medbridge Access Code: [email or web address]    Manual therapy  Seated medial glide L patellar grade 3- to promote mobility to decrease stiffness  Seated STM L vastus lateralis to help decrease tension to L patella.   Therapeutic exercise    Seated hip adduction ball and glute max squeeze 10x5 seconds for 2 sets. Slight decrease in discomfort with donning L shoe  Then with 2 folded pillows 10x5 seconds  S/L with 2 pillows between knees. Felt good for pt.   Supine hip IR at 90/90 limited R > L  Supine supine L hip ER AAROM  with PT 10x3  to promote ROM and decrease difficulty with donning and doffing L shoe  Supine R hip IR stretch, R foot lateral to L knee 30 seconds x 5 with PT  Supine L hip IR stretch with PT 30 seconds x 3   Log rolling for supine <> sit 3x. Pt states feeling better getting out of the lying down position   Reviewed HEP. Pt demonstrated and verbalized understanding.   Improved exercise technique, movement at target joints, use of target muscles after mod verbal, visual, tactile cues.    Pt demonstrates limited L hip ER and bilateral hip IR. Worked on promoting hip rotation ROM to help decrease back pain and improve ability to don and doff L sock and shoe with less L leg pain. Also worked on log rolling technique to promote ability to perform supine to sit transfer at home more comfortably. Pt states decreased back pain after session as well as less difficulty performing the motion to don and doff L shoe.       PT Short Term Goals - 09/24/17 1626      PT SHORT TERM GOAL #1   Title  Patient will be independent with her HEP to help decrease back and L knee pain and improve ability to perform functional tasks.     Time  3    Period  Weeks    Status  New    Target Date  10/17/17        PT Long Term Goals - 09/24/17 1631      PT LONG TERM GOAL #1   Title  Patient will have a decrease in back pain to 3/10 or less at worst to promote ability to ambulate, turn in bed, perform standing tasks with less pain.     Baseline  6/10 back pain at most (09/24/2017)    Time  8    Period  Weeks    Status  New    Target Date  11/21/17      PT LONG TERM GOAL #2   Title  Patient will have a decrease in L knee pain to 3/10 or less at worst to promote ability to ambulate, negotiate stairs, get into and out of a car more comfortably.     Baseline  6/10 L knee pain at most for the past 3 months (09/24/2017)    Time  8    Period  Weeks    Status  New    Target Date  11/21/17      PT LONG TERM GOAL  #3   Title  Patient will improve TUG time to 12 seconds or less as a demonstration of improved functional mobility and balance.     Baseline  TUG no AD: 16.05 seconds on average (09/24/2017)    Time  8    Period  Weeks    Status  New    Target Date  11/21/17      PT LONG TERM GOAL #4   Title  Patient will improve her back FOTO score by at least 10 points as a demonstration of improved funtion.     Baseline  Back FOTO: 33 (09/24/2017)    Time  8    Period  Weeks    Status  New    Target Date  11/21/17      PT LONG TERM GOAL #5   Title  Patient will improve bilateral LE strength by at least 1/2 MMT grade to promote ability to perform standing tasks.  Time  8    Period  Weeks    Status  New    Target Date  11/21/17      Additional Long Term Goals   Additional Long Term Goals  Yes      PT LONG TERM GOAL #6   Title  Patient will improve her Modified Oswestry Low Back pain disablity questionnaire by at least 10% as a demonstration of improved function.     Baseline  48% (09/24/2017)    Time  8    Period  Weeks    Status  New    Target Date  11/21/17            Plan - 10/01/17 1410    Clinical Impression Statement  Pt demonstrates limited L hip ER and bilateral hip IR. Worked on promoting hip rotation ROM to help decrease back pain and improve ability to don and doff L sock and shoe with less L leg pain. Also worked on log rolling technique to promote ability to perform supine to sit transfer at home more comfortably. Pt states decreased back pain after session as well as less difficulty performing the motion to don and doff L shoe.      Rehab Potential  Fair    Clinical Impairments Affecting Rehab Potential  (-) chronicity of condition, multiple areas of pain, medical history, age; (+) motivated, husband support    PT Frequency  2x / week    PT Duration  8 weeks    PT Treatment/Interventions  Therapeutic activities;Therapeutic exercise;Balance training;Neuromuscular  re-education;Patient/family education;Manual techniques;Dry needling;Aquatic Therapy;Electrical Stimulation;Iontophoresis 4mg /ml Dexamethasone;Gait training    PT Next Visit Plan  hip and knee strengthening, core strengthening, patellar mobility, manual techniques, modalities PRN    Consulted and Agree with Plan of Care  Patient       Patient will benefit from skilled therapeutic intervention in order to improve the following deficits and impairments:  Pain, Improper body mechanics, Postural dysfunction, Dizziness, Decreased strength, Difficulty walking  Visit Diagnosis: Chronic bilateral low back pain, with sciatica presence unspecified  Radiculopathy, lumbar region  Muscle weakness (generalized)  Chronic pain of left knee  History of falling     Problem List Patient Active Problem List   Diagnosis Date Noted  . Obese 06/20/2016  . Status post right knee replacement 06/19/2016  . S/P left TKA 06/18/2016  . Spinal stenosis of lumbar region 09/14/2015    Joneen Boers PT, DPT   10/01/2017, 2:17 PM  [facility] [address], [place], [address] Phone: [phone]   Fax:  [phone]  Name: JASSLYN FINKEL MRN: [id number] Date of Birth: [birth year]

## 2017-10-01 NOTE — Patient Instructions (Signed)
Medbridge Access Code: [email or web address]    Seated Hip Adduction Isometrics with Ball  Using 2 folded pillows 10x2 with 5 second holds

## 2017-10-02 ENCOUNTER — Ambulatory Visit: Payer: Medicare Other

## 2017-10-08 ENCOUNTER — Ambulatory Visit: Payer: Medicare Other

## 2017-10-08 DIAGNOSIS — M25562 Pain in left knee: Secondary | ICD-10-CM

## 2017-10-08 DIAGNOSIS — M6281 Muscle weakness (generalized): Secondary | ICD-10-CM

## 2017-10-08 DIAGNOSIS — M545 Low back pain: Secondary | ICD-10-CM | POA: Diagnosis not present

## 2017-10-08 DIAGNOSIS — G8929 Other chronic pain: Secondary | ICD-10-CM

## 2017-10-08 DIAGNOSIS — M5416 Radiculopathy, lumbar region: Secondary | ICD-10-CM

## 2017-10-08 NOTE — Patient Instructions (Addendum)
  PELVIC TILT: Posterior    Tighten abdominals, flatten low back.  Hold for 5 seconds.  _10__ reps per set, __3_ sets per day   Copyright  VHI. All rights reserved.     Also gave supine R hip IR stretch 30 seconds x 5 daily as part of her HEP. Handout provided. Pt demonstrated and verbalized understanding.

## 2017-10-08 NOTE — Therapy (Signed)
[facility] [address], [place], [address] Phone: [phone]   Fax:  [phone]  Physical Therapy Treatment  Patient Details  Name: Destiny Shaw MRN: [id number] Date of Birth: 29-Oct-1950 Referring Provider: Paralee Cancel, MD   Encounter Date: 10/08/2017  PT End of Session - 10/08/17 1520    Visit Number  3    Number of Visits  17    Date for PT Re-Evaluation  11/21/17    PT Start Time  1520    PT Stop Time  1602    PT Time Calculation (min)  42 min    Equipment Utilized During Treatment  Gait belt    Activity Tolerance  Patient tolerated treatment well    Behavior During Therapy  [facility] for tasks assessed/performed       Past Medical History:  Diagnosis Date  . ADD (attention deficit disorder)   . Anemia   . Anginal pain ([place])    pt states has occas chest pain relates to indigestion; pt uses rest to relieve   . Anxiety   . Arthritis   . Concussion   . Depression   . Diabetes mellitus without complication ([place])   . Dizziness   . Fall   . GERD (gastroesophageal reflux disease)   . Headache   . History of urinary tract infection   . Hyperlipidemia   . Hypertension   . Hypothyroidism   . IBS (irritable bowel syndrome)   . Imbalance   . Numbness    right leg   . Numbness in both hands    comes and goes   . Pneumonia    last episode approx 1 year ago   . Sleep apnea   . Wears glasses     Past Surgical History:  Procedure Laterality Date  . BREAST CYST ASPIRATION Left 1980's   neg  . BREAST CYST EXCISION Right 1980's   neg  . BREAST LUMPECTOMY Right   . BREAST SURGERY    . CARPAL TUNNEL RELEASE    . CESAREAN SECTION     times 2  . COLONOSCOPY WITH PROPOFOL N/A 05/11/2015   Procedure: COLONOSCOPY WITH PROPOFOL;  Surgeon: Manya Silvas, MD;  Location[facility] ENDOSCOPY;  Service: Endoscopy;  Laterality: N/A;  . DE QUERVAIN'S RELEASE Right   . ESOPHAGOGASTRODUODENOSCOPY (EGD) WITH  PROPOFOL N/A 05/11/2015   Procedure: ESOPHAGOGASTRODUODENOSCOPY (EGD) WITH PROPOFOL;  Surgeon: Manya Silvas, MD;  Location: [facility] ENDOSCOPY;  Service: Endoscopy;  Laterality: N/A;  . EYE SURGERY     laser surgery bilat   . HERNIA REPAIR    . KNEE ARTHROSCOPY    . LUMBAR LAMINECTOMY/DECOMPRESSION MICRODISCECTOMY Bilateral 09/14/2015   Procedure: MICRO LUMBAR BILATERAL DECOMPRESSION L4 - L5;  Surgeon: Susa Day, MD;  Location: [facility] ORS;  Service: Orthopedics;  Laterality: Bilateral;  . REDUCTION MAMMAPLASTY Bilateral 1980  . RHINOPLASTY    . TONSILLECTOMY    . TOTAL KNEE ARTHROPLASTY Left 06/18/2016   Procedure: LEFT TOTAL KNEE ARTHROPLASTY;  Surgeon: Paralee Cancel, MD;  Location: [facility] ORS;  Service: Orthopedics;  Laterality: Left;  Adductor Block  . TUBAL LIGATION    . UVULOPALATOPHARYNGOPLASTY      There were no vitals filed for this visit.  Subjective Assessment - 10/08/17 1521    Subjective  Back and L knee are a little bit better. Bothered her this morning. Has been doing her HEP.  The log rolling helps her get out of bed. Feels safer  walking around after doing her HEP.  L knee feels stiff in the morning.  The manual therapy to her L knee helps.   4/10 L knee 5-6/10 back pain, depending on how she moves.     Pertinent History  Low back pain. Pt states having back surgery about 2-3 years ago. After she had her L TKA, her back started aggravating her due to her walking. Pt also fell about 4 weeks ago onto her L knee. Dr. Alvan Dame checked out her L knee. L knee was fine but has some inflammation.  Pt tries to keep it iced. Pt still recovering for her L TKA but the fall set her back.   Her L knee surgery was last year.  L knee still bothers her a lot.  Had surgery for her back before her L knee surgery. Was doing well until her L knee surgery. The fall made it worse. Feels debilitating. Also has a hard time getting into and out of the car mainly due to her L knee.   Pt fell due to her L knee bucking  on her. Pt was trying to pick something up from the side of the couch.  Also has a hard time getting up from the floor.  Pt states having bladder accidents since after her fall (pt was recommended to tell her MD about it).  Bowel problems since taking medications (was constapated, but after taking medications, pt had diarrhea which was difficult to control. Getting out of the metformin fixed the bowel issue).  Pt states tingling and numbness L lateral LE along the L5 dermatome.  Denies saddle anesthesia.  Pt states that her back problem is mainly on her L side.  Pt landed on her L knee when she fell.  No other falls within the last 6 months. Pt also states being very dizzy since her L knee surgery.  Had PT treatement for her dizziness before which did not help.  The room feels like it is spinning when she gets dizzy.     Patient Stated Goals  Be better able to get into and out of her vehicle (midsized truck), into and out of chairs, be better able to roll in her bed with less L knee pain.    Currently in Pain?  Yes    Pain Score  6    5-6/10 back pain depending on how she moves.    Pain Onset  More than a month ago                               PT Education - 10/08/17 1558    Education Details  ther-ex, HEP    Person(s) Educated  Patient    Methods  Explanation;Demonstration;Tactile cues;Verbal cues;Handout    Comprehension  Returned demonstration;Verbalized understanding        Objectives   Medbridge Access Code: [email or web address]    Therapeutic exercise    Seated hip adduction ball and glute max squeeze 10x5 seconds  Seated R hip extension isometrics 5x5 seconds. R low back discomfort   Seated L hip extension isometrics 10x5 seconds for 3 sets. Decreased back pain   Supine R hip IR stretch, R foot lateral to L knee 30 seconds x 5 with PT  Reviewed and given as part of her HEP. Pt demonstrated and verbalized understanding. Handout provided.   Supine L hip IR  stretch with PT 30 seconds x 5  Pt states feeling  a release for L LE   Supine posterior pelvic tilts 10x5 seconds for 2 sets  Seated bilateral shoulder extension isometrics, hands on thighs 10x5 seconds for 2 sets to promote trunk muscle strengthening.    Reviewed HEP. Pt demonstrated and verbalized understanding.      Improved exercise technique, movement at target joints, use of target muscles after mod verbal, visual, tactile cues.   Worked on improving bilateral hip IR, decreasing low back extension pressure and improving core muscle activation. Pt states back feeling better after exercises. Decreased difficulty trying to don L shoe as well.      PT Short Term Goals - 09/24/17 1626      PT SHORT TERM GOAL #1   Title  Patient will be independent with her HEP to help decrease back and L knee pain and improve ability to perform functional tasks.     Time  3    Period  Weeks    Status  New    Target Date  10/17/17        PT Long Term Goals - 09/24/17 1631      PT LONG TERM GOAL #1   Title  Patient will have a decrease in back pain to 3/10 or less at worst to promote ability to ambulate, turn in bed, perform standing tasks with less pain.     Baseline  6/10 back pain at most (09/24/2017)    Time  8    Period  Weeks    Status  New    Target Date  11/21/17      PT LONG TERM GOAL #2   Title  Patient will have a decrease in L knee pain to 3/10 or less at worst to promote ability to ambulate, negotiate stairs, get into and out of a car more comfortably.     Baseline  6/10 L knee pain at most for the past 3 months (09/24/2017)    Time  8    Period  Weeks    Status  New    Target Date  11/21/17      PT LONG TERM GOAL #3   Title  Patient will improve TUG time to 12 seconds or less as a demonstration of improved functional mobility and balance.     Baseline  TUG no AD: 16.05 seconds on average (09/24/2017)    Time  8    Period  Weeks    Status  New    Target Date  11/21/17       PT LONG TERM GOAL #4   Title  Patient will improve her back FOTO score by at least 10 points as a demonstration of improved funtion.     Baseline  Back FOTO: 33 (09/24/2017)    Time  8    Period  Weeks    Status  New    Target Date  11/21/17      PT LONG TERM GOAL #5   Title  Patient will improve bilateral LE strength by at least 1/2 MMT grade to promote ability to perform standing tasks.     Time  8    Period  Weeks    Status  New    Target Date  11/21/17      Additional Long Term Goals   Additional Long Term Goals  Yes      PT LONG TERM GOAL #6   Title  Patient will improve her Modified Oswestry Low Back pain disablity questionnaire by at least  10% as a demonstration of improved function.     Baseline  48% (09/24/2017)    Time  8    Period  Weeks    Status  New    Target Date  11/21/17            Plan - 10/08/17 1559    Clinical Impression Statement  Worked on improving bilateral hip IR, decreasing low back extension pressure and improving core muscle activation. Pt states back feeling better after exercises. Decreased difficulty trying to don L shoe as well.     Rehab Potential  Fair    Clinical Impairments Affecting Rehab Potential  (-) chronicity of condition, multiple areas of pain, medical history, age; (+) motivated, husband support    PT Frequency  2x / week    PT Duration  8 weeks    PT Treatment/Interventions  Therapeutic activities;Therapeutic exercise;Balance training;Neuromuscular re-education;Patient/family education;Manual techniques;Dry needling;Aquatic Therapy;Electrical Stimulation;Iontophoresis 4mg /ml Dexamethasone;Gait training    PT Next Visit Plan  hip and knee strengthening, core strengthening, patellar mobility, manual techniques, modalities PRN    Consulted and Agree with Plan of Care  Patient       Patient will benefit from skilled therapeutic intervention in order to improve the following deficits and impairments:  Pain, Improper body  mechanics, Postural dysfunction, Dizziness, Decreased strength, Difficulty walking  Visit Diagnosis: Chronic bilateral low back pain, with sciatica presence unspecified  Radiculopathy, lumbar region  Muscle weakness (generalized)  Chronic pain of left knee     Problem List Patient Active Problem List   Diagnosis Date Noted  . Obese 06/20/2016  . Status post right knee replacement 06/19/2016  . S/P left TKA 06/18/2016  . Spinal stenosis of lumbar region 09/14/2015   Joneen Boers PT, DPT   10/08/2017, 8:16 PM  [facility] [address] [place], [address] Phone: [phone]   Fax:  [phone]  Name: KERILYN CORTNER MRN: [id number] Date of Birth: Oct 09, 1950

## 2017-10-10 ENCOUNTER — Ambulatory Visit: Payer: Medicare Other

## 2017-10-10 DIAGNOSIS — M545 Low back pain: Principal | ICD-10-CM

## 2017-10-10 DIAGNOSIS — M6281 Muscle weakness (generalized): Secondary | ICD-10-CM

## 2017-10-10 DIAGNOSIS — M25562 Pain in left knee: Secondary | ICD-10-CM

## 2017-10-10 DIAGNOSIS — G8929 Other chronic pain: Secondary | ICD-10-CM

## 2017-10-10 DIAGNOSIS — M5416 Radiculopathy, lumbar region: Secondary | ICD-10-CM

## 2017-10-10 NOTE — Therapy (Signed)
[facility] [address], [place], [address] Phone: [phone]   Fax:  [phone]  Physical Therapy Treatment  Patient Details  Name: Destiny Shaw MRN: [id number] Date of Birth: 10/13/1950 Referring Provider: Paralee Cancel, MD   Encounter Date: 10/10/2017  PT End of Session - 10/10/17 1504    Visit Number  4    Number of Visits  17    Date for PT Re-Evaluation  11/21/17    PT Start Time  1504    PT Stop Time  1546    PT Time Calculation (min)  42 min    Equipment Utilized During Treatment  Gait belt    Activity Tolerance  Patient tolerated treatment well    Behavior During Therapy  [facility] for tasks assessed/performed       Past Medical History:  Diagnosis Date  . ADD (attention deficit disorder)   . Anemia   . Anginal pain ([place])    pt states has occas chest pain relates to indigestion; pt uses rest to relieve   . Anxiety   . Arthritis   . Concussion   . Depression   . Diabetes mellitus without complication ([place])   . Dizziness   . Fall   . GERD (gastroesophageal reflux disease)   . Headache   . History of urinary tract infection   . Hyperlipidemia   . Hypertension   . Hypothyroidism   . IBS (irritable bowel syndrome)   . Imbalance   . Numbness    right leg   . Numbness in both hands    comes and goes   . Pneumonia    last episode approx 1 year ago   . Sleep apnea   . Wears glasses     Past Surgical History:  Procedure Laterality Date  . BREAST CYST ASPIRATION Left 1980's   neg  . BREAST CYST EXCISION Right 1980's   neg  . BREAST LUMPECTOMY Right   . BREAST SURGERY    . CARPAL TUNNEL RELEASE    . CESAREAN SECTION     times 2  . COLONOSCOPY WITH PROPOFOL N/A 05/11/2015   Procedure: COLONOSCOPY WITH PROPOFOL;  Surgeon: Manya Silvas, MD;  Location: [facility] ENDOSCOPY;  Service: Endoscopy;  Laterality: N/A;  . DE QUERVAIN'S RELEASE Right   . ESOPHAGOGASTRODUODENOSCOPY (EGD) WITH  PROPOFOL N/A 05/11/2015   Procedure: ESOPHAGOGASTRODUODENOSCOPY (EGD) WITH PROPOFOL;  Surgeon: Manya Silvas, MD;  [facility]COPY;  Service: Endoscopy;  Laterality: N/A;  . EYE SURGERY     laser surgery bilat   . HERNIA REPAIR    . KNEE ARTHROSCOPY    . LUMBAR LAMINECTOMY/DECOMPRESSION MICRODISCECTOMY Bilateral 09/14/2015   Procedure: MICRO LUMBAR BILATERAL DECOMPRESSION L4 - L5;  Surgeon: Susa Day, MD;  Location: [facility] ORS;  Service: Orthopedics;  Laterality: Bilateral;  . REDUCTION MAMMAPLASTY Bilateral 1980  . RHINOPLASTY    . TONSILLECTOMY    . TOTAL KNEE ARTHROPLASTY Left 06/18/2016   Procedure: LEFT TOTAL KNEE ARTHROPLASTY;  Surgeon: Paralee Cancel, MD;  Location: [facility] ORS;  Service: Orthopedics;  Laterality: Left;  Adductor Block  . TUBAL LIGATION    . UVULOPALATOPHARYNGOPLASTY      There were no vitals filed for this visit.  Subjective Assessment - 10/10/17 1504    Subjective  Back seems to be doing pretty good. L knee and leg is giving her problems today as far as getting around. Back is just normal discomfort, nothing unusual.  2.5/10  back pain, 3.5/10 L knee pain currently.  Pt adds that she wants to be able to get down on the floor and be able to get back up.     Pertinent History  Low back pain. Pt states having back surgery about 2-3 years ago. After she had her L TKA, her back started aggravating her due to her walking. Pt also fell about 4 weeks ago onto her L knee. Dr. Alvan Dame checked out her L knee. L knee was fine but has some inflammation.  Pt tries to keep it iced. Pt still recovering for her L TKA but the fall set her back.   Her L knee surgery was last year.  L knee still bothers her a lot.  Had surgery for her back before her L knee surgery. Was doing well until her L knee surgery. The fall made it worse. Feels debilitating. Also has a hard time getting into and out of the car mainly due to her L knee.   Pt fell due to her L knee bucking on her. Pt was trying to pick  something up from the side of the couch.  Also has a hard time getting up from the floor.  Pt states having bladder accidents since after her fall (pt was recommended to tell her MD about it).  Bowel problems since taking medications (was constapated, but after taking medications, pt had diarrhea which was difficult to control. Getting out of the metformin fixed the bowel issue).  Pt states tingling and numbness L lateral LE along the L5 dermatome.  Denies saddle anesthesia.  Pt states that her back problem is mainly on her L side.  Pt landed on her L knee when she fell.  No other falls within the last 6 months. Pt also states being very dizzy since her L knee surgery.  Had PT treatement for her dizziness before which did not help.  The room feels like it is spinning when she gets dizzy.     Patient Stated Goals  Be better able to get into and out of her vehicle (midsized truck), into and out of chairs, be better able to roll in her bed with less L knee pain. Be able to get down on the floor and get back up.     Currently in Pain?  Yes    Pain Score  3    3.5/10 L knee pain   Pain Onset  More than a month ago                               PT Education - 10/10/17 1522    Education Details  ther-ex, HEP    Person(s) Educated  Patient    Methods  Explanation;Demonstration;Tactile cues;Verbal cues;Handout    Comprehension  Verbalized understanding;Returned demonstration      Objectives   MedbridgeAccess Code: [email or web address]   Manual therapy  Seated medial glide L patellar grade 3- to promote mobility to decrease stiffness  Seated STM L vastus lateralis to help decrease tension to L patella.      Therapeutic exercise    Seated hip adduction ball and glute max squeeze and gentle isometric knee extension 10x5 seconds for 2 sets  Seated L knee flexion AAROM rolling small medicine ball with foot 10x5 seconds for 3 sets  Standing L LE leg press with bilateral UE  assist blue band 10x3  Forward step up onto 4 inch step with L  LE and R UE assist 10x2  Seated L LAQ 10x resisting 3 lbs 10x. Feels burning and stinging medial and lateral knee which eases with rest    Then with 2 lbs 5x2. No burning or stinging in knee   Improved exercise technique, movement at target joints, use of target muscles after mod verbal, visual, tactile cues.     Good carryover of decreased back pain from previous session. Focused on L knee today secondary to pt reports of her knee bothering her more than her back. Continued working on decreasing L patellar stiffness and improving medial glide, improving knee flexion ROM to decrease pain. Worked on improving quadriceps strength to improve ability to performing standing tasks and work towards being better able to get up from the floor.  Decreased L knee pain in sitting to 1-2/10 L after session.     PT Short Term Goals - 09/24/17 1626      PT SHORT TERM GOAL #1   Title  Patient will be independent with her HEP to help decrease back and L knee pain and improve ability to perform functional tasks.     Time  3    Period  Weeks    Status  New    Target Date  10/17/17        PT Long Term Goals - 09/24/17 1631      PT LONG TERM GOAL #1   Title  Patient will have a decrease in back pain to 3/10 or less at worst to promote ability to ambulate, turn in bed, perform standing tasks with less pain.     Baseline  6/10 back pain at most (09/24/2017)    Time  8    Period  Weeks    Status  New    Target Date  11/21/17      PT LONG TERM GOAL #2   Title  Patient will have a decrease in L knee pain to 3/10 or less at worst to promote ability to ambulate, negotiate stairs, get into and out of a car more comfortably.     Baseline  6/10 L knee pain at most for the past 3 months (09/24/2017)    Time  8    Period  Weeks    Status  New    Target Date  11/21/17      PT LONG TERM GOAL #3   Title  Patient will improve TUG time to 12  seconds or less as a demonstration of improved functional mobility and balance.     Baseline  TUG no AD: 16.05 seconds on average (09/24/2017)    Time  8    Period  Weeks    Status  New    Target Date  11/21/17      PT LONG TERM GOAL #4   Title  Patient will improve her back FOTO score by at least 10 points as a demonstration of improved funtion.     Baseline  Back FOTO: 33 (09/24/2017)    Time  8    Period  Weeks    Status  New    Target Date  11/21/17      PT LONG TERM GOAL #5   Title  Patient will improve bilateral LE strength by at least 1/2 MMT grade to promote ability to perform standing tasks.     Time  8    Period  Weeks    Status  New    Target Date  11/21/17  Additional Long Term Goals   Additional Long Term Goals  Yes      PT LONG TERM GOAL #6   Title  Patient will improve her Modified Oswestry Low Back pain disablity questionnaire by at least 10% as a demonstration of improved function.     Baseline  48% (09/24/2017)    Time  8    Period  Weeks    Status  New    Target Date  11/21/17            Plan - 10/10/17 1502    Clinical Impression Statement  Good carryover of decreased back pain from previous session. Focused on L knee today secondary to pt reports of her knee bothering her more than her back. Continued working on decreasing L patellar stiffness and improving medial glide, improving knee flexion ROM to decrease pain. Worked on improving quadriceps strength to improve ability to performing standing tasks and work towards being better able to get up from the floor.  Decreased L knee pain in sitting to 1-2/10 L after session.     Rehab Potential  Fair    Clinical Impairments Affecting Rehab Potential  (-) chronicity of condition, multiple areas of pain, medical history, age; (+) motivated, husband support    PT Frequency  2x / week    PT Duration  8 weeks    PT Treatment/Interventions  Therapeutic activities;Therapeutic exercise;Balance  training;Neuromuscular re-education;Patient/family education;Manual techniques;Dry needling;Aquatic Therapy;Electrical Stimulation;Iontophoresis 4mg /ml Dexamethasone;Gait training    PT Next Visit Plan  hip and knee strengthening, core strengthening, patellar mobility, manual techniques, modalities PRN    Consulted and Agree with Plan of Care  Patient       Patient will benefit from skilled therapeutic intervention in order to improve the following deficits and impairments:  Pain, Improper body mechanics, Postural dysfunction, Dizziness, Decreased strength, Difficulty walking  Visit Diagnosis: Chronic bilateral low back pain, with sciatica presence unspecified  Radiculopathy, lumbar region  Muscle weakness (generalized)  Chronic pain of left knee     Problem List Patient Active Problem List   Diagnosis Date Noted  . Obese 06/20/2016  . Status post right knee replacement 06/19/2016  . S/P left TKA 06/18/2016  . Spinal stenosis of lumbar region 09/14/2015    Joneen Boers PT, DPT   10/10/2017, 4:01 PM  [facility] [address], [place], [address] Phone: [phone]   Fax:  [phone]  Name: BRIGIDA SCOTTI MRN: [id number] Date of Birth: 1950/08/07

## 2017-10-10 NOTE — Patient Instructions (Addendum)
  Seated knee flexion/extension AAROM  Sitting on a chair or on your bed   Gently roll the ball towards you with your foot to feel a comfortable stretch bending at your knee. Hold for 5 seconds.     Keep your thigh from wobbling.     Repeat 10 times.   Perform 3 sets daily .

## 2017-10-15 ENCOUNTER — Ambulatory Visit: Payer: Medicare Other

## 2017-10-15 DIAGNOSIS — M6281 Muscle weakness (generalized): Secondary | ICD-10-CM

## 2017-10-15 DIAGNOSIS — M545 Low back pain: Principal | ICD-10-CM

## 2017-10-15 DIAGNOSIS — M5416 Radiculopathy, lumbar region: Secondary | ICD-10-CM

## 2017-10-15 DIAGNOSIS — M25562 Pain in left knee: Secondary | ICD-10-CM

## 2017-10-15 DIAGNOSIS — G8929 Other chronic pain: Secondary | ICD-10-CM

## 2017-10-15 NOTE — Patient Instructions (Addendum)
  MedbridgeAccess Code: [email or web address]   Standing Bilateral Gastroc Stretch with Step 20 seconds x 5 for 2x daily   Seated Long Arc Quad with Ankle Weight  10x3 with 5 second holds 2 lbs weight

## 2017-10-15 NOTE — Therapy (Signed)
[facility] [address] [place], [address] Phone: [phone]   Fax:  [phone]  Physical Therapy Treatment  Patient Details  Name: Destiny Shaw MRN: [id number] Date of Birth: 09-27-1950 Referring Provider: Paralee Cancel, MD   Encounter Date: 10/15/2017  PT End of Session - 10/15/17 0951    Visit Number  5    Number of Visits  17    Date for PT Re-Evaluation  11/21/17    PT Start Time  0951    PT Stop Time  1038    PT Time Calculation (min)  47 min    Equipment Utilized During Treatment  Gait belt    Activity Tolerance  Patient tolerated treatment well    Behavior During Therapy  [facility] for tasks assessed/performed       Past Medical History:  Diagnosis Date  . ADD (attention deficit disorder)   . Anemia   . Anginal pain ([place])    pt states has occas chest pain relates to indigestion; pt uses rest to relieve   . Anxiety   . Arthritis   . Concussion   . Depression   . Diabetes mellitus without complication ([place])   . Dizziness   . Fall   . GERD (gastroesophageal reflux disease)   . Headache   . History of urinary tract infection   . Hyperlipidemia   . Hypertension   . Hypothyroidism   . IBS (irritable bowel syndrome)   . Imbalance   . Numbness    right leg   . Numbness in both hands    comes and goes   . Pneumonia    last episode approx 1 year ago   . Sleep apnea   . Wears glasses     Past Surgical History:  Procedure Laterality Date  . BREAST CYST ASPIRATION Left 1980's   neg  . BREAST CYST EXCISION Right 1980's   neg  . BREAST LUMPECTOMY Right   . BREAST SURGERY    . CARPAL TUNNEL RELEASE    . CESAREAN SECTION     times 2  . COLONOSCOPY WITH PROPOFOL N/A 05/11/2015   Procedure: COLONOSCOPY WITH PROPOFOL;  Surgeon: Manya Silvas, MD;  Location: [facility] ENDOSCOPY;  Service: Endoscopy;  Laterality: N/A;  . DE QUERVAIN'S RELEASE Right   . ESOPHAGOGASTRODUODENOSCOPY (EGD) WITH  PROPOFOL N/A 05/11/2015   Procedure: ESOPHAGOGASTRODUODENOSCOPY (EGD) WITH PROPOFOL;  Surgeon: Manya Silvas, MD;  Location: [facility] ENDOSCOPY;  Service: Endoscopy;  Laterality: N/A;  . EYE SURGERY     laser surgery bilat   . HERNIA REPAIR    . KNEE ARTHROSCOPY    . LUMBAR LAMINECTOMY/DECOMPRESSION MICRODISCECTOMY Bilateral 09/14/2015   Procedure: MICRO LUMBAR BILATERAL DECOMPRESSION L4 - L5;  Surgeon: Susa Day, MD;  Location: [facility] ORS;  Service: Orthopedics;  Laterality: Bilateral;  . REDUCTION MAMMAPLASTY Bilateral 1980  . RHINOPLASTY    . TONSILLECTOMY    . TOTAL KNEE ARTHROPLASTY Left 06/18/2016   Procedure: LEFT TOTAL KNEE ARTHROPLASTY;  Surgeon: Paralee Cancel, MD;  Location: [facility] ORS;  Service: Orthopedics;  Laterality: Left;  Adductor Block  . TUBAL LIGATION    . UVULOPALATOPHARYNGOPLASTY      There were no vitals filed for this visit.  Subjective Assessment - 10/15/17 0952    Subjective  Back is achy (4/10). L knee was horrible.  Her pillow between the knees places pressure on her knee cap. L knee is better this morning. Also had to get  down onto the floor to do something with her cat and could not get up.  4/10 L knee pain currently.  Pt also adds bilateral dorsal foot pain during stance phase of gait.     Pertinent History  Low back pain. Pt states having back surgery about 2-3 years ago. After she had her L TKA, her back started aggravating her due to her walking. Pt also fell about 4 weeks ago onto her L knee. Dr. Alvan Dame checked out her L knee. L knee was fine but has some inflammation.  Pt tries to keep it iced. Pt still recovering for her L TKA but the fall set her back.   Her L knee surgery was last year.  L knee still bothers her a lot.  Had surgery for her back before her L knee surgery. Was doing well until her L knee surgery. The fall made it worse. Feels debilitating. Also has a hard time getting into and out of the car mainly due to her L knee.   Pt fell due to her L knee bucking  on her. Pt was trying to pick something up from the side of the couch.  Also has a hard time getting up from the floor.  Pt states having bladder accidents since after her fall (pt was recommended to tell her MD about it).  Bowel problems since taking medications (was constapated, but after taking medications, pt had diarrhea which was difficult to control. Getting out of the metformin fixed the bowel issue).  Pt states tingling and numbness L lateral LE along the L5 dermatome.  Denies saddle anesthesia.  Pt states that her back problem is mainly on her L side.  Pt landed on her L knee when she fell.  No other falls within the last 6 months. Pt also states being very dizzy since her L knee surgery.  Had PT treatement for her dizziness before which did not help.  The room feels like it is spinning when she gets dizzy.  I feel like it is getting better then she does something.     Patient Stated Goals  Be better able to get into and out of her vehicle (midsized truck), into and out of chairs, be better able to roll in her bed with less L knee pain. Be able to get down on the floor and get back up.     Currently in Pain?  Yes    Pain Score  4     Pain Onset  More than a month ago                               PT Education - 10/15/17 1029    Education Details  ther-ex, HEP    Person(s) Educated  Patient    Methods  Explanation;Demonstration;Tactile cues;Verbal cues    Comprehension  Returned demonstration;Verbalized understanding        Objectives   MedbridgeAccess Code: [email or web address]   Manual therapy  Seated STM L vastus lateralis to help decrease tension to L patella. Pt states L knee feels better afterwards.    Therapeutic exercise    Seated hip adduction ball and glute max squeeze and gentle isometric knee extension 10x5 seconds for 3 sets  Standing ankle DF/PF on rockerboard with bilateral UE assist 2 minutes to help decrease dorsal foot pain during  stance phase of gait  Standing gastroc stretch with bilateral UE assist with feet on rocker  board 20 seconds x 5. No dorsal foot pain during stance phase of gait afterwards  Seated LAQ 2 lbs 10x5 seconds for 3 sets  Reviewed HEP. Pt demonstrated and verbalized understanding.    Improved exercise technique, movement at target joints, use of target muscles after mod verbal, visual, tactile cues.   Decreased L knee pain with treatment to decrease vastus lateralis muscle tension. Decreased bilateral dorsal foot pain with treatment to improve ankle DF ROM and decrease gastroc tightness.  Continued working on L quadriceps strengthening as well to promote ability to get up from the floor position at home. Pt tolerated session well without aggravation of symptoms. Pt will benefit from continued skilled physical therapy services to help decrease knee and back pain and improve function.            PT Short Term Goals - 09/24/17 1626      PT SHORT TERM GOAL #1   Title  Patient will be independent with her HEP to help decrease back and L knee pain and improve ability to perform functional tasks.     Time  3    Period  Weeks    Status  New    Target Date  10/17/17        PT Long Term Goals - 09/24/17 1631      PT LONG TERM GOAL #1   Title  Patient will have a decrease in back pain to 3/10 or less at worst to promote ability to ambulate, turn in bed, perform standing tasks with less pain.     Baseline  6/10 back pain at most (09/24/2017)    Time  8    Period  Weeks    Status  New    Target Date  11/21/17      PT LONG TERM GOAL #2   Title  Patient will have a decrease in L knee pain to 3/10 or less at worst to promote ability to ambulate, negotiate stairs, get into and out of a car more comfortably.     Baseline  6/10 L knee pain at most for the past 3 months (09/24/2017)    Time  8    Period  Weeks    Status  New    Target Date  11/21/17      PT LONG TERM GOAL #3   Title  Patient  will improve TUG time to 12 seconds or less as a demonstration of improved functional mobility and balance.     Baseline  TUG no AD: 16.05 seconds on average (09/24/2017)    Time  8    Period  Weeks    Status  New    Target Date  11/21/17      PT LONG TERM GOAL #4   Title  Patient will improve her back FOTO score by at least 10 points as a demonstration of improved funtion.     Baseline  Back FOTO: 33 (09/24/2017)    Time  8    Period  Weeks    Status  New    Target Date  11/21/17      PT LONG TERM GOAL #5   Title  Patient will improve bilateral LE strength by at least 1/2 MMT grade to promote ability to perform standing tasks.     Time  8    Period  Weeks    Status  New    Target Date  11/21/17      Additional Long Term Goals  Additional Long Term Goals  Yes      PT LONG TERM GOAL #6   Title  Patient will improve her Modified Oswestry Low Back pain disablity questionnaire by at least 10% as a demonstration of improved function.     Baseline  48% (09/24/2017)    Time  8    Period  Weeks    Status  New    Target Date  11/21/17            Plan - 10/15/17 1029    Clinical Impression Statement  Decreased L knee pain with treatment to decrease vastus lateralis muscle tension. Decreased bilateral dorsal foot pain with treatment to improve ankle DF ROM and decrease gastroc tightness.  Continued working on L quadriceps strengthening as well to promote ability to get up from the floor position at home. Pt tolerated session well without aggravation of symptoms. Pt will benefit from continued skilled physical therapy services to help decrease knee and back pain and improve function.     Rehab Potential  Fair    Clinical Impairments Affecting Rehab Potential  (-) chronicity of condition, multiple areas of pain, medical history, age; (+) motivated, husband support    PT Frequency  2x / week    PT Duration  8 weeks    PT Treatment/Interventions  Therapeutic activities;Therapeutic  exercise;Balance training;Neuromuscular re-education;Patient/family education;Manual techniques;Dry needling;Aquatic Therapy;Electrical Stimulation;Iontophoresis 4mg /ml Dexamethasone;Gait training    PT Next Visit Plan  hip and knee strengthening, core strengthening, patellar mobility, manual techniques, modalities PRN    Consulted and Agree with Plan of Care  Patient       Patient will benefit from skilled therapeutic intervention in order to improve the following deficits and impairments:  Pain, Improper body mechanics, Postural dysfunction, Dizziness, Decreased strength, Difficulty walking  Visit Diagnosis: Chronic bilateral low back pain, with sciatica presence unspecified  Radiculopathy, lumbar region  Muscle weakness (generalized)  Chronic pain of left knee     Problem List Patient Active Problem List   Diagnosis Date Noted  . Obese 06/20/2016  . Status post right knee replacement 06/19/2016  . S/P left TKA 06/18/2016  . Spinal stenosis of lumbar region 09/14/2015    Joneen Boers PT, DPT   10/15/2017, 10:59 AM  [facility] [address] [place], [address] Phone: [phone]   Fax:  [phone]  Name: Destiny Shaw MRN: [id number] Date of Birth: 08-Mar-1950

## 2017-10-16 ENCOUNTER — Ambulatory Visit: Payer: Medicare Other | Admitting: Speech Pathology

## 2017-10-16 DIAGNOSIS — R41841 Cognitive communication deficit: Secondary | ICD-10-CM

## 2017-10-16 DIAGNOSIS — M545 Low back pain: Secondary | ICD-10-CM | POA: Diagnosis not present

## 2017-10-17 ENCOUNTER — Other Ambulatory Visit: Payer: Self-pay

## 2017-10-17 ENCOUNTER — Encounter: Payer: Self-pay | Admitting: Speech Pathology

## 2017-10-17 ENCOUNTER — Ambulatory Visit: Payer: Medicare Other

## 2017-10-17 DIAGNOSIS — M6281 Muscle weakness (generalized): Secondary | ICD-10-CM

## 2017-10-17 DIAGNOSIS — M545 Low back pain: Principal | ICD-10-CM

## 2017-10-17 DIAGNOSIS — M5416 Radiculopathy, lumbar region: Secondary | ICD-10-CM

## 2017-10-17 DIAGNOSIS — M25562 Pain in left knee: Secondary | ICD-10-CM

## 2017-10-17 DIAGNOSIS — G8929 Other chronic pain: Secondary | ICD-10-CM
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
## 2017-10-17 NOTE — Therapy (Signed)
[place] [facility] [address], [place], [address] Phone: [phone]   Fax:  [phone]  Physical Therapy Treatment  Patient Details  Name: Destiny Shaw MRN: [id number] Date of Birth: November 13, 1950 Referring Provider: Paralee Cancel, MD   Encounter Date: 10/17/2017  PT End of Session - 10/17/17 1521    Visit Number  6    Number of Visits  17    Date for PT Re-Evaluation  11/21/17    PT Start Time  0272    PT Stop Time  1603    PT Time Calculation (min)  42 min    Equipment Utilized During Treatment  Gait belt    Activity Tolerance  Patient tolerated treatment well    Behavior During Therapy  [facility] for tasks assessed/performed       Past Medical History:  Diagnosis Date  . ADD (attention deficit disorder)   . Anemia   . Anginal pain ([place])    pt states has occas chest pain relates to indigestion; pt uses rest to relieve   . Anxiety   . Arthritis   . Concussion   . Depression   . Diabetes mellitus without complication ([place])   . Dizziness   . Fall   . GERD (gastroesophageal reflux disease)   . Headache   . History of urinary tract infection   . Hyperlipidemia   . Hypertension   . Hypothyroidism   . IBS (irritable bowel syndrome)   . Imbalance   . Numbness    right leg   . Numbness in both hands    comes and goes   . Pneumonia    last episode approx 1 year ago   . Sleep apnea   . Wears glasses     Past Surgical History:  Procedure Laterality Date  . BREAST CYST ASPIRATION Left 1980's   neg  . BREAST CYST EXCISION Right 1980's   neg  . BREAST LUMPECTOMY Right   . BREAST SURGERY    . CARPAL TUNNEL RELEASE    . CESAREAN SECTION     times 2  . COLONOSCOPY WITH PROPOFOL N/A 05/11/2015   Procedure: COLONOSCOPY WITH PROPOFOL;  Surgeon: Manya Silvas, MD;  Location: [facility] ENDOSCOPY;  Service: Endoscopy;  Laterality: N/A;  . DE QUERVAIN'S RELEASE Right   . ESOPHAGOGASTRODUODENOSCOPY (EGD) WITH  PROPOFOL N/A 05/11/2015   Procedure: ESOPHAGOGASTRODUODENOSCOPY (EGD) WITH PROPOFOL;  Surgeon: Manya Silvas, MD;  [facility] ENDOSCOPY;  Service: Endoscopy;  Laterality: N/A;  . EYE SURGERY     laser surgery bilat   . HERNIA REPAIR    . KNEE ARTHROSCOPY    . LUMBAR LAMINECTOMY/DECOMPRESSION MICRODISCECTOMY Bilateral 09/14/2015   Procedure: MICRO LUMBAR BILATERAL DECOMPRESSION L4 - L5;  Surgeon: Susa Day, MD;  Location: [facility] ORS;  Service: Orthopedics;  Laterality: Bilateral;  . REDUCTION MAMMAPLASTY Bilateral 1980  . RHINOPLASTY    . TONSILLECTOMY    . TOTAL KNEE ARTHROPLASTY Left 06/18/2016   Procedure: LEFT TOTAL KNEE ARTHROPLASTY;  Surgeon: Paralee Cancel, MD;  Location: [facility] ORS;  Service: Orthopedics;  Laterality: Left;  Adductor Block  . TUBAL LIGATION    . UVULOPALATOPHARYNGOPLASTY      There were no vitals filed for this visit.  Subjective Assessment - 10/17/17 1522    Subjective  Back is not at bad today but has not done anything today. Has been doing her home exercises. 4/10 back pain currently (does not do good providing pain level numbers.  Feels a little bit better).   L knee is ok. Its a little better (5/10 when walking, not bad).  Currently feels tingling around her L knee cap. The glute max muscle squeeze helps her back.     Pertinent History  Low back pain. Pt states having back surgery about 2-3 years ago. After she had her L TKA, her back started aggravating her due to her walking. Pt also fell about 4 weeks ago onto her L knee. Dr. Alvan Dame checked out her L knee. L knee was fine but has some inflammation.  Pt tries to keep it iced. Pt still recovering for her L TKA but the fall set her back.   Her L knee surgery was last year.  L knee still bothers her a lot.  Had surgery for her back before her L knee surgery. Was doing well until her L knee surgery. The fall made it worse. Feels debilitating. Also has a hard time getting into and out of the car mainly due to her L knee.    Pt fell due to her L knee bucking on her. Pt was trying to pick something up from the side of the couch.  Also has a hard time getting up from the floor.  Pt states having bladder accidents since after her fall (pt was recommended to tell her MD about it).  Bowel problems since taking medications (was constapated, but after taking medications, pt had diarrhea which was difficult to control. Getting out of the metformin fixed the bowel issue).  Pt states tingling and numbness L lateral LE along the L5 dermatome.  Denies saddle anesthesia.  Pt states that her back problem is mainly on her L side.  Pt landed on her L knee when she fell.  No other falls within the last 6 months. Pt also states being very dizzy since her L knee surgery.  Had PT treatement for her dizziness before which did not help.  The room feels like it is spinning when she gets dizzy.  I feel like it is getting better then she does something.     Patient Stated Goals  Be better able to get into and out of her vehicle (midsized truck), into and out of chairs, be better able to roll in her bed with less L knee pain. Be able to get down on the floor and get back up.     Currently in Pain?  Yes    Pain Score  4    back; 5/10 L knee   Pain Onset  More than a month ago                               PT Education - 10/17/17 1531    Education Details  ther-ex    Person(s) Educated  Patient    Methods  Explanation;Demonstration;Tactile cues;Verbal cues    Comprehension  Returned demonstration;Verbalized understanding      Objectives   MedbridgeAccess Code: [email or web address]    Therapeutic exercise    Seated hip adduction ball and glute max squeezeand gentle isometric knee extension10x5 seconds. Slight increase in L knee cap area burning sensation.   Then using a folded pillow: 10x5 seconds for 2 sets. Decreased knee pain/symptoms  Log rolling for sit <> supine x 5. Comfortable for back  Supine transversus  abdominis contractions, then with   Supine hip fallouts 10x2 each LE. Difficulty with lumbo pelvic control. Demonstrates L pelvic  rotation with L hip fallout  No back pain in standing per pt after exercise.   Increased time secondary to emphasis on proper technique  Standing PNF chops to the L to help decrease L pelvic rotation. Yellow band 10x3  Standing ankle DF/PF on rockerboard with bilateral UE assist 2 minutes   Seated LAQ 2 lbs 10x5 seconds for 2 sets    Improved exercise technique, movement at target joints, use of target muscles after mod verbal, visual, tactile cues.    Decreased back pain with exercises promoting trunk and glute muscle activation as well as with working on lumbopelvic control. Decreased L knee pain in sitting with activation of glute, hip adductor and VMO muscles.  Pt will will benefit from continued skilled physical therapy services to decrease back and knee pain and improve function.        PT Short Term Goals - 09/24/17 1626      PT SHORT TERM GOAL #1   Title  Patient will be independent with her HEP to help decrease back and L knee pain and improve ability to perform functional tasks.     Time  3    Period  Weeks    Status  New    Target Date  10/17/17        PT Long Term Goals - 09/24/17 1631      PT LONG TERM GOAL #1   Title  Patient will have a decrease in back pain to 3/10 or less at worst to promote ability to ambulate, turn in bed, perform standing tasks with less pain.     Baseline  6/10 back pain at most (09/24/2017)    Time  8    Period  Weeks    Status  New    Target Date  11/21/17      PT LONG TERM GOAL #2   Title  Patient will have a decrease in L knee pain to 3/10 or less at worst to promote ability to ambulate, negotiate stairs, get into and out of a car more comfortably.     Baseline  6/10 L knee pain at most for the past 3 months (09/24/2017)    Time  8    Period  Weeks    Status  New    Target Date  11/21/17      PT  LONG TERM GOAL #3   Title  Patient will improve TUG time to 12 seconds or less as a demonstration of improved functional mobility and balance.     Baseline  TUG no AD: 16.05 seconds on average (09/24/2017)    Time  8    Period  Weeks    Status  New    Target Date  11/21/17      PT LONG TERM GOAL #4   Title  Patient will improve her back FOTO score by at least 10 points as a demonstration of improved funtion.     Baseline  Back FOTO: 33 (09/24/2017)    Time  8    Period  Weeks    Status  New    Target Date  11/21/17      PT LONG TERM GOAL #5   Title  Patient will improve bilateral LE strength by at least 1/2 MMT grade to promote ability to perform standing tasks.     Time  8    Period  Weeks    Status  New    Target Date  11/21/17  Additional Long Term Goals   Additional Long Term Goals  Yes      PT LONG TERM GOAL #6   Title  Patient will improve her Modified Oswestry Low Back pain disablity questionnaire by at least 10% as a demonstration of improved function.     Baseline  48% (09/24/2017)    Time  8    Period  Weeks    Status  New    Target Date  11/21/17            Plan - 10/17/17 1532    Clinical Impression Statement  Decreased back pain with exercises promoting trunk and glute muscle activation as well as with working on lumbopelvic control. Decreased L knee pain in sitting with activation of glute, hip adductor and VMO muscles.  Pt will will benefit from continued skilled physical therapy services to decrease back and knee pain and improve function.     Rehab Potential  Fair    Clinical Impairments Affecting Rehab Potential  (-) chronicity of condition, multiple areas of pain, medical history, age; (+) motivated, husband support    PT Frequency  2x / week    PT Duration  8 weeks    PT Treatment/Interventions  Therapeutic activities;Therapeutic exercise;Balance training;Neuromuscular re-education;Patient/family education;Manual techniques;Dry needling;Aquatic  Therapy;Electrical Stimulation;Iontophoresis 4mg /ml Dexamethasone;Gait training    PT Next Visit Plan  hip and knee strengthening, core strengthening, patellar mobility, manual techniques, modalities PRN    Consulted and Agree with Plan of Care  Patient       Patient will benefit from skilled therapeutic intervention in order to improve the following deficits and impairments:  Pain, Improper body mechanics, Postural dysfunction, Dizziness, Decreased strength, Difficulty walking  Visit Diagnosis: Chronic bilateral low back pain, with sciatica presence unspecified  Radiculopathy, lumbar region  Muscle weakness (generalized)  Chronic pain of left knee     Problem List Patient Active Problem List   Diagnosis Date Noted  . Obese 06/20/2016  . Status post right knee replacement 06/19/2016  . S/P left TKA 06/18/2016  . Spinal stenosis of lumbar region 09/14/2015     Joneen Boers PT, DPT  10/17/2017, 7:35 PM  [place] [facility] [address] [place], [address] Phone: [phone]   Fax:  [phone]  Name: Destiny Shaw MRN: [id number] Date of Birth: 18-Dec-1950

## 2017-10-21 ENCOUNTER — Ambulatory Visit: Payer: Medicare Other | Admitting: Speech Pathology

## 2017-10-21 DIAGNOSIS — R41841 Cognitive communication deficit: Secondary | ICD-10-CM

## 2017-10-21 DIAGNOSIS — M545 Low back pain: Secondary | ICD-10-CM | POA: Diagnosis not present

## 2017-10-22 ENCOUNTER — Ambulatory Visit: Payer: Medicare Other

## 2017-10-22 ENCOUNTER — Encounter: Payer: Self-pay | Admitting: Speech Pathology

## 2017-10-22 DIAGNOSIS — M545 Low back pain: Principal | ICD-10-CM

## 2017-10-22 DIAGNOSIS — M5416 Radiculopathy, lumbar region: Secondary | ICD-10-CM

## 2017-10-22 DIAGNOSIS — M25562 Pain in left knee: Secondary | ICD-10-CM

## 2017-10-22 DIAGNOSIS — M6281 Muscle weakness (generalized): Secondary | ICD-10-CM

## 2017-10-22 DIAGNOSIS — G8929 Other chronic pain: Secondary | ICD-10-CM

## 2017-10-22 NOTE — Patient Instructions (Addendum)
  Sitting on a chair              Squeeze your rear end muscles together.               Hold for 5 seconds              Repeat 10 times              Perform at least 3 sets daily.       PELVIC TILT: Posterior    Tighten abdominals, flatten low back.  Hold for 5 seconds.  _10__ reps per set, __3_ sets per day       Seated knee flexion/extension AAROM  Sitting on a chair or on your bed              Gently roll the ball towards you with your foot to feel a comfortable stretch bending at your knee. Hold for 5 seconds.                Keep your thigh from wobbling.                Repeat 10 times.              Perform 3 sets daily .           MedbridgeAccess Code: [email or web address]  Bent Knee Fallouts  10x3 each LE
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
## 2017-10-22 NOTE — Therapy (Signed)
[place] [address], [place], [address] Phone: [phone]   Fax:  [phone]  Physical Therapy Treatment  Patient Details  Name: Destiny Shaw MRN: [id number] Date of Birth: 1950/09/20 Referring Provider: Paralee Cancel, MD   Encounter Date: 10/22/2017  PT End of Session - 10/22/17 0933    Visit Number  7    Number of Visits  17    Date for PT Re-Evaluation  11/21/17    PT Start Time  0934    PT Stop Time  1014    PT Time Calculation (min)  40 min    Equipment Utilized During Treatment  Gait belt    Activity Tolerance  Patient tolerated treatment well    Behavior During Therapy  [facility] for tasks assessed/performed       Past Medical History:  Diagnosis Date  . ADD (attention deficit disorder)   . Anemia   . Anginal pain ([place])    pt states has occas chest pain relates to indigestion; pt uses rest to relieve   . Anxiety   . Arthritis   . Concussion   . Depression   . Diabetes mellitus without complication ([place])   . Dizziness   . Fall   . GERD (gastroesophageal reflux disease)   . Headache   . History of urinary tract infection   . Hyperlipidemia   . Hypertension   . Hypothyroidism   . IBS (irritable bowel syndrome)   . Imbalance   . Numbness    right leg   . Numbness in both hands    comes and goes   . Pneumonia    last episode approx 1 year ago   . Sleep apnea   . Wears glasses     Past Surgical History:  Procedure Laterality Date  . BREAST CYST ASPIRATION Left 1980's   neg  . BREAST CYST EXCISION Right 1980's   neg  . BREAST LUMPECTOMY Right   . BREAST SURGERY    . CARPAL TUNNEL RELEASE    . CESAREAN SECTION     times 2  . COLONOSCOPY WITH PROPOFOL N/A 05/11/2015   Procedure: COLONOSCOPY WITH PROPOFOL;  Surgeon: Manya Silvas, MD;  Location: [facility] ENDOSCOPY;  Service: Endoscopy;  Laterality: N/A;  . DE QUERVAIN'S RELEASE Right   . ESOPHAGOGASTRODUODENOSCOPY (EGD) WITH  PROPOFOL N/A 05/11/2015   Procedure: ESOPHAGOGASTRODUODENOSCOPY (EGD) WITH PROPOFOL;  Surgeon: Manya Silvas, MD;  Location: [facility] ENDOSCOPY;  Service: Endoscopy;  Laterality: N/A;  . EYE SURGERY     laser surgery bilat   . HERNIA REPAIR    . KNEE ARTHROSCOPY    . LUMBAR LAMINECTOMY/DECOMPRESSION MICRODISCECTOMY Bilateral 09/14/2015   Procedure: MICRO LUMBAR BILATERAL DECOMPRESSION L4 - L5;  Surgeon: Susa Day, MD;  Location: [facility] ORS;  Service: Orthopedics;  Laterality: Bilateral;  . REDUCTION MAMMAPLASTY Bilateral 1980  . RHINOPLASTY    . TONSILLECTOMY    . TOTAL KNEE ARTHROPLASTY Left 06/18/2016   Procedure: LEFT TOTAL KNEE ARTHROPLASTY;  Surgeon: Paralee Cancel, MD;  Location: [facility] ORS;  Service: Orthopedics;  Laterality: Left;  Adductor Block  . TUBAL LIGATION    . UVULOPALATOPHARYNGOPLASTY      There were no vitals filed for this visit.  Subjective Assessment - 10/22/17 0935    Subjective  Pt states that she has had a concussion about a year and a half ago to 2 years. Still recovering from that.  Does her HEP when she thinks about  them. Not regularly.   Wants a printout of her HEP.   Back does not feel too bad this morning. Yesterday, her back bothered her when moving and walking, mainly on her L side.  L knee bothered her yesterday.  3/10 L low back pain currently, 3/10 L knee pain currently. 7/10 L low back and L knee pain yesterday.     Pertinent History  Low back pain. Pt states having back surgery about 2-3 years ago. After she had her L TKA, her back started aggravating her due to her walking. Pt also fell about 4 weeks ago onto her L knee. Dr. Alvan Dame checked out her L knee. L knee was fine but has some inflammation.  Pt tries to keep it iced. Pt still recovering for her L TKA but the fall set her back.   Her L knee surgery was last year.  L knee still bothers her a lot.  Had surgery for her back before her L knee surgery. Was doing well until her L knee surgery. The fall made it worse.  Feels debilitating. Also has a hard time getting into and out of the car mainly due to her L knee.   Pt fell due to her L knee bucking on her. Pt was trying to pick something up from the side of the couch.  Also has a hard time getting up from the floor.  Pt states having bladder accidents since after her fall (pt was recommended to tell her MD about it).  Bowel problems since taking medications (was constapated, but after taking medications, pt had diarrhea which was difficult to control. Getting out of the metformin fixed the bowel issue).  Pt states tingling and numbness L lateral LE along the L5 dermatome.  Denies saddle anesthesia.  Pt states that her back problem is mainly on her L side.  Pt landed on her L knee when she fell.  No other falls within the last 6 months. Pt also states being very dizzy since her L knee surgery.  Had PT treatement for her dizziness before which did not help.  The room feels like it is spinning when she gets dizzy.  I feel like it is getting better then she does something.     Patient Stated Goals  Be better able to get into and out of her vehicle (midsized truck), into and out of chairs, be better able to roll in her bed with less L knee pain. Be able to get down on the floor and get back up.     Currently in Pain?  Yes    Pain Score  3     Pain Onset  More than a month ago                               PT Education - 10/22/17 0957    Education Details  ther-ex, HEP    Person(s) Educated  Patient    Methods  Explanation;Demonstration;Tactile cues;Verbal cues;Handout    Comprehension  Returned demonstration;Verbalized understanding         Objectives   MedbridgeAccess Code: [email or web address]    Therapeutic exercise   Seated R hip extension isometrics 10x5 seconds (secondary to L posterior pelvic tilt in sitting observed) for 2 sets   Supine transversus abdominis contractions, then with              Supine hip fallouts 10x2 each  LE.  Difficulty controlling  L thigh during R hip fallout  Increased time secondary to emphasis on quality of movement  Standing PNF chops to the L to help decrease L pelvic rotation. Yellow band 10x3  Standing pallof press straight yellow band 10x5 seconds 3 sets  Seated LAQ 2 lbs 10x5 seconds for 2 sets  Reviewed all HEP. Handout provided. Pt demonstrated and verbalized understanding.   Improved exercise technique, movement at target joints, use of target muscles after mod verbal, visual, tactile cues.    Continued working on improving lumbopelvic control and trunk strength to decrease stress to low back area. Decreased sitting L low back discomfort and L posterior thigh symptoms following exercise to promote R glute max activation.  Pt states back feels so much better after session.          PT Short Term Goals - 09/24/17 1626      PT SHORT TERM GOAL #1   Title  Patient will be independent with her HEP to help decrease back and L knee pain and improve ability to perform functional tasks.     Time  3    Period  Weeks    Status  New    Target Date  10/17/17        PT Long Term Goals - 09/24/17 1631      PT LONG TERM GOAL #1   Title  Patient will have a decrease in back pain to 3/10 or less at worst to promote ability to ambulate, turn in bed, perform standing tasks with less pain.     Baseline  6/10 back pain at most (09/24/2017)    Time  8    Period  Weeks    Status  New    Target Date  11/21/17      PT LONG TERM GOAL #2   Title  Patient will have a decrease in L knee pain to 3/10 or less at worst to promote ability to ambulate, negotiate stairs, get into and out of a car more comfortably.     Baseline  6/10 L knee pain at most for the past 3 months (09/24/2017)    Time  8    Period  Weeks    Status  New    Target Date  11/21/17      PT LONG TERM GOAL #3   Title  Patient will improve TUG time to 12 seconds or less as a demonstration of improved functional  mobility and balance.     Baseline  TUG no AD: 16.05 seconds on average (09/24/2017)    Time  8    Period  Weeks    Status  New    Target Date  11/21/17      PT LONG TERM GOAL #4   Title  Patient will improve her back FOTO score by at least 10 points as a demonstration of improved funtion.     Baseline  Back FOTO: 33 (09/24/2017)    Time  8    Period  Weeks    Status  New    Target Date  11/21/17      PT LONG TERM GOAL #5   Title  Patient will improve bilateral LE strength by at least 1/2 MMT grade to promote ability to perform standing tasks.     Time  8    Period  Weeks    Status  New    Target Date  11/21/17      Additional Long Term Goals  Additional Long Term Goals  Yes      PT LONG TERM GOAL #6   Title  Patient will improve her Modified Oswestry Low Back pain disablity questionnaire by at least 10% as a demonstration of improved function.     Baseline  48% (09/24/2017)    Time  8    Period  Weeks    Status  New    Target Date  11/21/17            Plan - 10/22/17 0932    Clinical Impression Statement  Continued working on improving lumbopelvic control and trunk strength to decrease stress to low back area. Decreased sitting L low back discomfort and L posterior thigh symptoms following exercise to promote R glute max activation.  Pt states back feels so much better after session.     Rehab Potential  Fair    Clinical Impairments Affecting Rehab Potential  (-) chronicity of condition, multiple areas of pain, medical history, age; (+) motivated, husband support    PT Frequency  2x / week    PT Duration  8 weeks    PT Treatment/Interventions  Therapeutic activities;Therapeutic exercise;Balance training;Neuromuscular re-education;Patient/family education;Manual techniques;Dry needling;Aquatic Therapy;Electrical Stimulation;Iontophoresis 4mg /ml Dexamethasone;Gait training    PT Next Visit Plan  hip and knee strengthening, core strengthening, patellar mobility, manual  techniques, modalities PRN    Consulted and Agree with Plan of Care  Patient       Patient will benefit from skilled therapeutic intervention in order to improve the following deficits and impairments:  Pain, Improper body mechanics, Postural dysfunction, Dizziness, Decreased strength, Difficulty walking  Visit Diagnosis: Chronic bilateral low back pain, with sciatica presence unspecified  Radiculopathy, lumbar region  Muscle weakness (generalized)  Chronic pain of left knee     Problem List Patient Active Problem List   Diagnosis Date Noted  . Obese 06/20/2016  . Status post right knee replacement 06/19/2016  . S/P left TKA 06/18/2016  . Spinal stenosis of lumbar region 09/14/2015    Joneen Boers PT, DPT   10/22/2017, 10:41 AM  [facility] [address], [place], [address] Phone: [phone]   Fax:  [phone]  Name: TEVA BRONKEMA MRN: [id number] Date of Birth: Sep 02, 1950

## 2017-10-23 ENCOUNTER — Ambulatory Visit: Payer: Medicare Other | Admitting: Speech Pathology

## 2017-10-24 ENCOUNTER — Ambulatory Visit: Payer: Medicare Other

## 2017-10-24 ENCOUNTER — Ambulatory Visit: Payer: Medicare Other | Admitting: Speech Pathology

## 2017-10-24 ENCOUNTER — Encounter: Payer: Self-pay | Admitting: Speech Pathology

## 2017-10-24 DIAGNOSIS — M6281 Muscle weakness (generalized): Secondary | ICD-10-CM

## 2017-10-24 DIAGNOSIS — G8929 Other chronic pain: Secondary | ICD-10-CM

## 2017-10-24 DIAGNOSIS — M545 Low back pain: Secondary | ICD-10-CM | POA: Diagnosis not present

## 2017-10-24 DIAGNOSIS — M25562 Pain in left knee: Principal | ICD-10-CM

## 2017-10-24 DIAGNOSIS — R41841 Cognitive communication deficit: Secondary | ICD-10-CM
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
## 2017-10-24 NOTE — Patient Instructions (Signed)
Pt was recommended to continue massaging her L lateral thigh, followed by her hip adduction pillow squeeze to help with her L knee discomfort at home. Pt verbalized understanding.

## 2017-10-29 ENCOUNTER — Ambulatory Visit: Payer: Medicare Other | Attending: Orthopedic Surgery

## 2017-10-29 DIAGNOSIS — G8929 Other chronic pain: Secondary | ICD-10-CM | POA: Diagnosis present

## 2017-10-29 DIAGNOSIS — Z9181 History of falling: Secondary | ICD-10-CM | POA: Insufficient documentation

## 2017-10-29 DIAGNOSIS — R41841 Cognitive communication deficit: Secondary | ICD-10-CM | POA: Insufficient documentation

## 2017-10-29 DIAGNOSIS — M25562 Pain in left knee: Secondary | ICD-10-CM | POA: Diagnosis present

## 2017-10-29 DIAGNOSIS — R42 Dizziness and giddiness: Secondary | ICD-10-CM

## 2017-10-29 DIAGNOSIS — M6281 Muscle weakness (generalized): Secondary | ICD-10-CM | POA: Insufficient documentation

## 2017-10-29 DIAGNOSIS — M545 Low back pain: Secondary | ICD-10-CM | POA: Insufficient documentation

## 2017-10-29 DIAGNOSIS — M5416 Radiculopathy, lumbar region: Secondary | ICD-10-CM | POA: Insufficient documentation

## 2017-10-29 NOTE — Therapy (Addendum)
[place] [facility] [address] [place], [address] Phone: [phone]   Fax:  [phone]  Physical Therapy Treatment And Progress Report  Patient Details  Name: Destiny Shaw MRN: [id number] Date of Birth: October 30, 1950 Referring Provider: Paralee Cancel, MD   Encounter Date: 10/29/2017  PT End of Session - 10/29/17 0858    Visit Number  9    Number of Visits  17    Date for PT Re-Evaluation  11/21/17    Authorization Type  9    Authorization Time Period  of 10 progress note Medicare    PT Start Time  0858    PT Stop Time  0947    PT Time Calculation (min)  49 min    Equipment Utilized During Treatment  Gait belt    Activity Tolerance  Patient tolerated treatment well    Behavior During Therapy  [facility] for tasks assessed/performed       Past Medical History:  Diagnosis Date  . ADD (attention deficit disorder)   . Anemia   . Anginal pain ([place])    pt states has occas chest pain relates to indigestion; pt uses rest to relieve   . Anxiety   . Arthritis   . Concussion   . Depression   . Diabetes mellitus without complication ([place])   . Dizziness   . Fall   . GERD (gastroesophageal reflux disease)   . Headache   . History of urinary tract infection   . Hyperlipidemia   . Hypertension   . Hypothyroidism   . IBS (irritable bowel syndrome)   . Imbalance   . Numbness    right leg   . Numbness in both hands    comes and goes   . Pneumonia    last episode approx 1 year ago   . Sleep apnea   . Wears glasses     Past Surgical History:  Procedure Laterality Date  . BREAST CYST ASPIRATION Left 1980's   neg  . BREAST CYST EXCISION Right 1980's   neg  . BREAST LUMPECTOMY Right   . BREAST SURGERY    . CARPAL TUNNEL RELEASE    . CESAREAN SECTION     times 2  . COLONOSCOPY WITH PROPOFOL N/A 05/11/2015   Procedure: COLONOSCOPY WITH PROPOFOL;  Surgeon: Manya Silvas, MD;  Location: [facility] ENDOSCOPY;  Service:  Endoscopy;  Laterality: N/A;  . DE QUERVAIN'S RELEASE Right   . ESOPHAGOGASTRODUODENOSCOPY (EGD) WITH PROPOFOL N/A 05/11/2015   Procedure: ESOPHAGOGASTRODUODENOSCOPY (EGD) WITH PROPOFOL;  Surgeon: Manya Silvas, MD;  Location: [facility] ENDOSCOPY;  Service: Endoscopy;  Laterality: N/A;  . EYE SURGERY     laser surgery bilat   . HERNIA REPAIR    . KNEE ARTHROSCOPY    . LUMBAR LAMINECTOMY/DECOMPRESSION MICRODISCECTOMY Bilateral 09/14/2015   Procedure: MICRO LUMBAR BILATERAL DECOMPRESSION L4 - L5;  Surgeon: Susa Day, MD;  Location: [facility] ORS;  Service: Orthopedics;  Laterality: Bilateral;  . REDUCTION MAMMAPLASTY Bilateral 1980  . RHINOPLASTY    . TONSILLECTOMY    . TOTAL KNEE ARTHROPLASTY Left 06/18/2016   Procedure: LEFT TOTAL KNEE ARTHROPLASTY;  Surgeon: Paralee Cancel, MD;  Location: [facility] ORS;  Service: Orthopedics;  Laterality: Left;  Adductor Block  . TUBAL LIGATION    . UVULOPALATOPHARYNGOPLASTY      There were no vitals filed for this visit.  Subjective Assessment - 10/29/17 0859    Subjective  Back is fair to midlin. Not bad but  not real good. L knee has been an issue most of the morning. Does not think she did anything that should have caused it to be hurting.  2-3/10 back pain currently, 7-8/10 at most for the past 7 days, hurts for a short period of time. The duration of pain is less since starting PT.  Feels like there is an improvement.    4-5/10 L knee pain currently.  7-8/10 L knee pain at most for the past 7 days.  The pain is not a continuous thing.  The duration of L knee pain was longer before starting PT. Pain does not last as long in her L knee since starting PT.     Pertinent History  Low back pain. Pt states having back surgery about 2-3 years ago. After she had her L TKA, her back started aggravating her due to her walking. Pt also fell about 4 weeks ago onto her L knee. Dr. Alvan Dame checked out her L knee. L knee was fine but has some inflammation.  Pt tries to keep it iced. Pt  still recovering for her L TKA but the fall set her back.   Her L knee surgery was last year.  L knee still bothers her a lot.  Had surgery for her back before her L knee surgery. Was doing well until her L knee surgery. The fall made it worse. Feels debilitating. Also has a hard time getting into and out of the car mainly due to her L knee.   Pt fell due to her L knee bucking on her. Pt was trying to pick something up from the side of the couch.  Also has a hard time getting up from the floor.  Pt states having bladder accidents since after her fall (pt was recommended to tell her MD about it).  Bowel problems since taking medications (was constapated, but after taking medications, pt had diarrhea which was difficult to control. Getting out of the metformin fixed the bowel issue).  Pt states tingling and numbness L lateral LE along the L5 dermatome.  Denies saddle anesthesia.  Pt states that her back problem is mainly on her L side.  Pt landed on her L knee when she fell.  No other falls within the last 6 months. Pt also states being very dizzy since her L knee surgery.  Had PT treatement for her dizziness before which did not help.  The room feels like it is spinning when she gets dizzy.  I feel like it is getting better then she does something.     Patient Stated Goals  Be better able to get into and out of her vehicle (midsized truck), into and out of chairs, be better able to roll in her bed with less L knee pain. Be able to get down on the floor and get back up.     Currently in Pain?  Yes    Pain Score  3    2-3/10 back pain currently   Pain Onset  More than a month ago         [facility] PT Assessment - 10/29/17 0905      Observation/Other Assessments   Observations  TUG:14.3 seconds on average.     Focus on Therapeutic Outcomes (FOTO)   Back FOTO: 38    Modified Osw[place]  46%      Strength   Right Hip Flexion  4/5    Right Hip Extension  4-/5   seated manually resisted hip extension  Right  Hip ABduction  4-/5   seated manually resisted clamshell isometrics   Left Hip Flexion  4-/5    Left Hip Extension  4-/5   seated manually resisted hip extension   Left Hip ABduction  4-/5   seated manually resisted clamshell isometrics   Right Knee Flexion  5/5    Right Knee Extension  5/5    Left Knee Flexion  4-/5    Left Knee Extension  4+/5   no pain                          PT Education - 10/29/17 0917    Education Details  Ther-ex, progress/current status with PT towards goals.     Person(s) Educated  Patient    Methods  Explanation;Demonstration;Tactile cues;Verbal cues    Comprehension  Returned demonstration;Verbalized understanding         Objectives   MedbridgeAccess Code: [email or web address]    Therapeutic exercise   Seated manually resisted hip flexion, hip extension, clamshell, knee extension, knee flexion 1x each way for each LE.  Reviewed progress/current status with strength with pt.  Standing up from a chair, walking 10 ft forward, then returning 10 ft, then sitting back onto chair 3x  No AD: 15 seconds. 14 seconds, 14 seconds (14.3 seconds average). Pt states feeling more confident with her balance.   Seated L hip extension isometrics 10x2 with 5 second holds  Side stepping 32 ft to the R and to the L 2x to promote glute med muscle strengthening as well as balance  Standing L LE leg press resisting blue band 10x3 with bilateral UE assist      Improved exercise technique, movement at target joints, use of target muscles after mod verbal, visual, tactile cues.   Manual therapy   Seated medial glide L patella grade 3- to 3              Seated STM L vastus lateralis  No pain in L knee at rest after manual therapy.    Patient demonstrates overall improvement in low back and L knee pain with decreasing duration of pain at worst based on subjective reports, improved overall bilateral LE strength, and TUG times suggesting  improved functonal mobility and balance since initial evaluation. Pt still demonstrates back and L knee pain, decreased trunk and hip strength, and difficulty performing functional tasks and would benefit from continued skilled physical therapy services to address the following deficits. Challenges to progress include chronicity of condition.      PT Short Term Goals - 10/29/17 1846      PT SHORT TERM GOAL #1   Title  Patient will be independent with her HEP to help decrease back and L knee pain and improve ability to perform functional tasks.     Time  3    Period  Weeks    Status  On-going    Target Date  11/21/17        PT Long Term Goals - 10/29/17 1846      PT LONG TERM GOAL #1   Title  Patient will have a decrease in back pain to 3/10 or less at worst to promote ability to ambulate, turn in bed, perform standing tasks with less pain.     Baseline  6/10 back pain at most (09/24/2017); 7-8/10 at most for the past 7 days, duration of pain is better since starting PT (10/29/2017)    Time  8  Period  Weeks    Status  On-going    Target Date  11/21/17      PT LONG TERM GOAL #2   Title  Patient will have a decrease in L knee pain to 3/10 or less at worst to promote ability to ambulate, negotiate stairs, get into and out of a car more comfortably.     Baseline  6/10 L knee pain at most for the past 3 months (09/24/2017); 7-8/10 at most for the past 7 days, duration of pain is better since starting PT (10/29/2017)    Time  8    Period  Weeks    Status  On-going    Target Date  11/21/17      PT LONG TERM GOAL #3   Title  Patient will improve TUG time to 12 seconds or less as a demonstration of improved functional mobility and balance.     Baseline  TUG no AD: 16.05 seconds on average (09/24/2017); 14.3 seconds on average (10/29/2017)    Time  8    Period  Weeks    Status  Partially Met    Target Date  11/21/17      PT LONG TERM GOAL #4   Title  Patient will improve her back FOTO  score by at least 10 points as a demonstration of improved funtion.     Baseline  Back FOTO: 33 (09/24/2017); 38 (10/29/2017)    Time  8    Period  Weeks    Status  On-going    Target Date  11/21/17      PT LONG TERM GOAL #5   Title  Patient will improve bilateral LE strength by at least 1/2 MMT grade to promote ability to perform standing tasks.     Time  8    Period  Weeks    Status  Partially Met    Target Date  11/21/17      PT LONG TERM GOAL #6   Title  Patient will improve her Modified Oswestry Low Back pain disablity questionnaire by at least 10% as a demonstration of improved function.     Baseline  48% (09/24/2017); 46% (10/29/2017)    Time  8    Period  Weeks    Status  On-going    Target Date  11/21/17            Plan - 10/29/17 0931    Clinical Impression Statement  Patient demonstrates overall improvement in low back and L knee pain with decreasing duration of pain at worst based on subjective reports, improved overall bilateral LE strength, and TUG times suggesting improved functonal mobility and balance since initial evaluation. Pt still demonstrates back and L knee pain, decreased trunk and hip strength, and difficulty performing functional tasks and would benefit from continued skilled physical therapy services to address the following deficits. Challenges to progress include chronicity of condition.     History and Personal Factors relevant to plan of care:   Chronic low back pain, L LE radiating symptoms, L knee pain, hx of fall, medical history, LE weakness, difficulty getting into and out of the car, stair negotiatin, bed mobility,  and walking due to back and L knee pain.      Clinical Presentation  Stable    Clinical Presentation due to:  pt making some progress towards goals    Clinical Decision Making  Low    Rehab Potential  Fair    Clinical Impairments Affecting Rehab Potential  (-)  chronicity of condition, multiple areas of pain, medical history, age; (+)  motivated, husband support    PT Frequency  2x / week    PT Duration  8 weeks    PT Treatment/Interventions  Therapeutic activities;Therapeutic exercise;Balance training;Neuromuscular re-education;Patient/family education;Manual techniques;Dry needling;Aquatic Therapy;Electrical Stimulation;Iontophoresis 90m/ml Dexamethasone;Gait training    PT Next Visit Plan  hip and knee strengthening, core strengthening, patellar mobility, manual techniques, modalities PRN    Consulted and Agree with Plan of Care  Patient       Patient will benefit from skilled therapeutic intervention in order to improve the following deficits and impairments:  Pain, Improper body mechanics, Postural dysfunction, Dizziness, Decreased strength, Difficulty walking  Visit Diagnosis: Muscle weakness (generalized)  Chronic pain of left knee  Chronic bilateral low back pain, with sciatica presence unspecified  Radiculopathy, lumbar region  History of falling  Dizziness and giddiness     Problem List Patient Active Problem List   Diagnosis Date Noted  . Obese 06/20/2016  . Status post right knee replacement 06/19/2016  . S/P left TKA 06/18/2016  . Spinal stenosis of lumbar region 09/14/2015   Thank you for your referral.  MJoneen BoersPT, DPT   10/29/2017, 6:59 PM  [facility] [address] [place] [address]Phone: [phone]  Fax:  [phone] Name: Destiny MARANTOMRN: [id number] of Birth: 4Oct 29, 1952

## 2017-10-29 NOTE — Patient Instructions (Signed)
  Walk sideways at your hallway about 30 ft to the R and to the L to feel muscles working at the sides of your hips.   Perform 1 to 2 times daly.

## 2017-10-30 ENCOUNTER — Encounter: Payer: Self-pay | Admitting: Speech Pathology

## 2017-10-30 ENCOUNTER — Ambulatory Visit: Payer: Medicare Other | Admitting: Speech Pathology

## 2017-10-30 DIAGNOSIS — R41841 Cognitive communication deficit: Secondary | ICD-10-CM

## 2017-10-30 DIAGNOSIS — M6281 Muscle weakness (generalized): Secondary | ICD-10-CM | POA: Diagnosis not present
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
## 2017-10-31 ENCOUNTER — Ambulatory Visit: Payer: Medicare Other

## 2017-11-01 ENCOUNTER — Encounter: Payer: Self-pay | Admitting: Speech Pathology

## 2017-11-01 ENCOUNTER — Ambulatory Visit: Payer: Medicare Other | Admitting: Speech Pathology

## 2017-11-01 DIAGNOSIS — M6281 Muscle weakness (generalized): Secondary | ICD-10-CM | POA: Diagnosis not present

## 2017-11-01 DIAGNOSIS — R41841 Cognitive communication deficit: Secondary | ICD-10-CM
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
## 2017-11-05 ENCOUNTER — Ambulatory Visit: Payer: Medicare Other

## 2017-11-05 DIAGNOSIS — M25562 Pain in left knee: Secondary | ICD-10-CM

## 2017-11-05 DIAGNOSIS — M6281 Muscle weakness (generalized): Secondary | ICD-10-CM | POA: Diagnosis not present

## 2017-11-05 DIAGNOSIS — Z9181 History of falling: Secondary | ICD-10-CM

## 2017-11-05 DIAGNOSIS — M5416 Radiculopathy, lumbar region: Secondary | ICD-10-CM

## 2017-11-05 DIAGNOSIS — G8929 Other chronic pain: Secondary | ICD-10-CM

## 2017-11-05 DIAGNOSIS — M545 Low back pain: Secondary | ICD-10-CM

## 2017-11-05 NOTE — Therapy (Signed)
[place] [facility] [address] [place], [address] Phone: [phone]   Fax:  [phone]  Physical Therapy Treatment  Patient Details  Name: Destiny Shaw MRN: [id number] Date of Birth: Nov 03, 1950 Referring Provider: Paralee Cancel, MD   Encounter Date: 11/05/2017  PT End of Session - 11/05/17 1058    Visit Number  10    Number of Visits  17    Date for PT Re-Evaluation  11/21/17    Authorization Type  1    Authorization Time Period  of 10 progress note Medicare    PT Start Time  1058   pt arrived late   PT Stop Time  1140    PT Time Calculation (min)  42 min    Equipment Utilized During Treatment  Gait belt    Activity Tolerance  Patient tolerated treatment well    Behavior During Therapy  WFL for tasks assessed/performed       Past Medical History:  Diagnosis Date  . ADD (attention deficit disorder)   . Anemia   . Anginal pain ([place])    pt states has occas chest pain relates to indigestion; pt uses rest to relieve   . Anxiety   . Arthritis   . Concussion   . Depression   . Diabetes mellitus without complication ([place])   . Dizziness   . Fall   . GERD (gastroesophageal reflux disease)   . Headache   . History of urinary tract infection   . Hyperlipidemia   . Hypertension   . Hypothyroidism   . IBS (irritable bowel syndrome)   . Imbalance   . Numbness    right leg   . Numbness in both hands    comes and goes   . Pneumonia    last episode approx 1 year ago   . Sleep apnea   . Wears glasses     Past Surgical History:  Procedure Laterality Date  . BREAST CYST ASPIRATION Left 1980's   neg  . BREAST CYST EXCISION Right 1980's   neg  . BREAST LUMPECTOMY Right   . BREAST SURGERY    . CARPAL TUNNEL RELEASE    . CESAREAN SECTION     times 2  . COLONOSCOPY WITH PROPOFOL N/A 05/11/2015   Procedure: COLONOSCOPY WITH PROPOFOL;  Surgeon: Manya Silvas, MD;  Location: [facility] ENDOSCOPY;  Service:  Endoscopy;  Laterality: N/A;  . DE QUERVAIN'S RELEASE Right   . ESOPHAGOGASTRODUODENOSCOPY (EGD) WITH PROPOFOL N/A 05/11/2015   Procedure: ESOPHAGOGASTRODUODENOSCOPY (EGD) WITH PROPOFOL;  Surgeon: Manya Silvas, MD;  Location: [facility] ENDOSCOPY;  Service: Endoscopy;  Laterality: N/A;  . EYE SURGERY     laser surgery bilat   . HERNIA REPAIR    . KNEE ARTHROSCOPY    . LUMBAR LAMINECTOMY/DECOMPRESSION MICRODISCECTOMY Bilateral 09/14/2015   Procedure: MICRO LUMBAR BILATERAL DECOMPRESSION L4 - L5;  Surgeon: Susa Day, MD;  Location: [facility] ORS;  Service: Orthopedics;  Laterality: Bilateral;  . REDUCTION MAMMAPLASTY Bilateral 1980  . RHINOPLASTY    . TONSILLECTOMY    . TOTAL KNEE ARTHROPLASTY Left 06/18/2016   Procedure: LEFT TOTAL KNEE ARTHROPLASTY;  Surgeon: Paralee Cancel, MD;  Location: [facility] ORS;  Service: Orthopedics;  Laterality: Left;  Adductor Block  . TUBAL LIGATION    . UVULOPALATOPHARYNGOPLASTY      There were no vitals filed for this visit.  Subjective Assessment - 11/05/17 1100    Subjective  Had L lateral neck pain yesterday. Better  this morning. Might have been due to her coughing.  L knee was doing good until her neck bothered her. This morning L knee is a 4/10 and back is about a 4/10 also.      Pertinent History  Low back pain. Pt states having back surgery about 2-3 years ago. After she had her L TKA, her back started aggravating her due to her walking. Pt also fell about 4 weeks ago onto her L knee. Dr. Alvan Dame checked out her L knee. L knee was fine but has some inflammation.  Pt tries to keep it iced. Pt still recovering for her L TKA but the fall set her back.   Her L knee surgery was last year.  L knee still bothers her a lot.  Had surgery for her back before her L knee surgery. Was doing well until her L knee surgery. The fall made it worse. Feels debilitating. Also has a hard time getting into and out of the car mainly due to her L knee.   Pt fell due to her L knee bucking on her. Pt  was trying to pick something up from the side of the couch.  Also has a hard time getting up from the floor.  Pt states having bladder accidents since after her fall (pt was recommended to tell her MD about it).  Bowel problems since taking medications (was constapated, but after taking medications, pt had diarrhea which was difficult to control. Getting out of the metformin fixed the bowel issue).  Pt states tingling and numbness L lateral LE along the L5 dermatome.  Denies saddle anesthesia.  Pt states that her back problem is mainly on her L side.  Pt landed on her L knee when she fell.  No other falls within the last 6 months. Pt also states being very dizzy since her L knee surgery.  Had PT treatement for her dizziness before which did not help.  The room feels like it is spinning when she gets dizzy.  I feel like it is getting better then she does something.     Patient Stated Goals  Be better able to get into and out of her vehicle (midsized truck), into and out of chairs, be better able to roll in her bed with less L knee pain. Be able to get down on the floor and get back up.     Currently in Pain?  Yes    Pain Score  4     Pain Onset  More than a month ago                               PT Education - 11/05/17 1109    Education Details  ther-ex    Person(s) Educated  Patient    Methods  Explanation;Demonstration;Tactile cues;Verbal cues    Comprehension  Returned demonstration;Verbalized understanding         Objectives   MedbridgeAccess Code: [email or web address]  Therapeutic exercise   Seated hip adduction pillow squeeze and glute max squeeze 10x10 seconds for 2 sets to promote VMO muscle activation  Seated L knee extension resisting 2 lbs 10x3  seated scap retract, chin tucks, pressing tongue to roof of mouth during manual therapy. Decreased L lateral neck pain.   Blood pressure, L arm sitting, mechanically taken, normal cuff: 153/83, HR 74  Standing  bilateral scapular retraction resisting red band 10x5 seconds for 3 sets  Standing leg press  resisting blue band 10x3 L LE with bilateral UE assist  10x3 R LE with bilateral UE assist   Improved exercise technique, movement at target joints, use of target muscles after min to mod verbal, visual, tactile cues.      Manual therapy  Seated medial glide L patella grade 3- to 3           Seated STM L vastus lateralis  Decreased L knee pain afterwards    Continued working on improving L patellar mobility and decreasing tension to vastus lateralis to decrease L knee pain. Decreased low back pain with treatment to promote thoracic extension and glute muscle activation. Decreased L lateral neck pain with treatment to promote thoracic extension, scapular and anterior cervical strengthening.       PT Short Term Goals - 10/29/17 1846      PT SHORT TERM GOAL #1   Title  Patient will be independent with her HEP to help decrease back and L knee pain and improve ability to perform functional tasks.     Time  3    Period  Weeks    Status  On-going    Target Date  11/21/17        PT Long Term Goals - 10/29/17 1846      PT LONG TERM GOAL #1   Title  Patient will have a decrease in back pain to 3/10 or less at worst to promote ability to ambulate, turn in bed, perform standing tasks with less pain.     Baseline  6/10 back pain at most (09/24/2017); 7-8/10 at most for the past 7 days, duration of pain is better since starting PT (10/29/2017)    Time  8    Period  Weeks    Status  On-going    Target Date  11/21/17      PT LONG TERM GOAL #2   Title  Patient will have a decrease in L knee pain to 3/10 or less at worst to promote ability to ambulate, negotiate stairs, get into and out of a car more comfortably.     Baseline  6/10 L knee pain at most for the past 3 months (09/24/2017); 7-8/10 at most for the past 7 days, duration of pain is better since starting PT (10/29/2017)    Time  8     Period  Weeks    Status  On-going    Target Date  11/21/17      PT LONG TERM GOAL #3   Title  Patient will improve TUG time to 12 seconds or less as a demonstration of improved functional mobility and balance.     Baseline  TUG no AD: 16.05 seconds on average (09/24/2017); 14.3 seconds on average (10/29/2017)    Time  8    Period  Weeks    Status  Partially Met    Target Date  11/21/17      PT LONG TERM GOAL #4   Title  Patient will improve her back FOTO score by at least 10 points as a demonstration of improved funtion.     Baseline  Back FOTO: 33 (09/24/2017); 38 (10/29/2017)    Time  8    Period  Weeks    Status  On-going    Target Date  11/21/17      PT LONG TERM GOAL #5   Title  Patient will improve bilateral LE strength by at least 1/2 MMT grade to promote ability to perform standing tasks.  Time  8    Period  Weeks    Status  Partially Met    Target Date  11/21/17      PT LONG TERM GOAL #6   Title  Patient will improve her Modified Oswestry Low Back pain disablity questionnaire by at least 10% as a demonstration of improved function.     Baseline  48% (09/24/2017); 46% (10/29/2017)    Time  8    Period  Weeks    Status  On-going    Target Date  11/21/17            Plan - 11/05/17 1109    Clinical Impression Statement  Continued working on improving L patellar mobility and decreasing tension to vastus lateralis to decrease L knee pain. Decreased low back pain with treatment to promote thoracic extension and glute muscle activation. Decreased L lateral neck pain with treatment to promote thoracic extension, scapular and anterior cervical strengthening.     Rehab Potential  Fair    Clinical Impairments Affecting Rehab Potential  (-) chronicity of condition, multiple areas of pain, medical history, age; (+) motivated, husband support    PT Frequency  2x / week    PT Duration  8 weeks    PT Treatment/Interventions  Therapeutic activities;Therapeutic exercise;Balance  training;Neuromuscular re-education;Patient/family education;Manual techniques;Dry needling;Aquatic Therapy;Electrical Stimulation;Iontophoresis 1m/ml Dexamethasone;Gait training    PT Next Visit Plan  hip and knee strengthening, core strengthening, patellar mobility, manual techniques, modalities PRN    Consulted and Agree with Plan of Care  Patient       Patient will benefit from skilled therapeutic intervention in order to improve the following deficits and impairments:  Pain, Improper body mechanics, Postural dysfunction, Dizziness, Decreased strength, Difficulty walking  Visit Diagnosis: Cognitive communication deficit  Muscle weakness (generalized)  Chronic pain of left knee  Chronic bilateral low back pain, with sciatica presence unspecified  Radiculopathy, lumbar region  History of falling     Problem List Patient Active Problem List   Diagnosis Date Noted  . Obese 06/20/2016  . Status post right knee replacement 06/19/2016  . S/P left TKA 06/18/2016  . Spinal stenosis of lumbar region 09/14/2015   MJoneen BoersPT, DPT   11/05/2017, 11:47 AM  [facility] [address] [place] [address]Phone: [phone]  Fax:  [phone] Name: RALVITA FANAMRN: [id number] of Birth: [birth year]

## 2017-11-07 ENCOUNTER — Ambulatory Visit: Payer: Medicare Other

## 2017-11-07 DIAGNOSIS — M545 Low back pain: Secondary | ICD-10-CM

## 2017-11-07 DIAGNOSIS — M6281 Muscle weakness (generalized): Secondary | ICD-10-CM | POA: Diagnosis not present

## 2017-11-07 DIAGNOSIS — M25562 Pain in left knee: Secondary | ICD-10-CM

## 2017-11-07 DIAGNOSIS — G8929 Other chronic pain: Secondary | ICD-10-CM

## 2017-11-07 NOTE — Therapy (Signed)
[facility] [address] [place], [address] Phone: [phone]   Fax:  [phone]  Physical Therapy Treatment  Patient Details  Name: Destiny Shaw MRN: [id number] Date of Birth: [birth year] Referring Provider: Paralee Cancel, MD   Encounter Date: 11/07/2017  PT End of Session - 11/07/17 1023    Visit Number  11    Number of Visits  17    Date for PT Re-Evaluation  11/21/17    Authorization Type  2    Authorization Time Period  of 10 progress note Medicare    PT Start Time  1026    PT Stop Time  1110    PT Time Calculation (min)  44 min    Equipment Utilized During Treatment  Gait belt    Activity Tolerance  Patient tolerated treatment well    Behavior During Therapy  WFL for tasks assessed/performed       Past Medical History:  Diagnosis Date  . ADD (attention deficit disorder)   . Anemia   . Anginal pain ([place])    pt states has occas chest pain relates to indigestion; pt uses rest to relieve   . Anxiety   . Arthritis   . Concussion   . Depression   . Diabetes mellitus without complication ([place])   . Dizziness   . Fall   . GERD (gastroesophageal reflux disease)   . Headache   . History of urinary tract infection   . Hyperlipidemia   . Hypertension   . Hypothyroidism   . IBS (irritable bowel syndrome)   . Imbalance   . Numbness    right leg   . Numbness in both hands    comes and goes   . Pneumonia    last episode approx 1 year ago   . Sleep apnea   . Wears glasses     Past Surgical History:  Procedure Laterality Date  . BREAST CYST ASPIRATION Left 1980's   neg  . BREAST CYST EXCISION Right 1980's   neg  . BREAST LUMPECTOMY Right   . BREAST SURGERY    . CARPAL TUNNEL RELEASE    . CESAREAN SECTION     times 2  . COLONOSCOPY WITH PROPOFOL N/A 05/11/2015   Procedure: COLONOSCOPY WITH PROPOFOL;  Surgeon: Manya Silvas, MD;  Location: [facility] ENDOSCOPY;  Service: Endoscopy;   Laterality: N/A;  . DE QUERVAIN'S RELEASE Right   . ESOPHAGOGASTRODUODENOSCOPY (EGD) WITH PROPOFOL N/A 05/11/2015   Procedure: ESOPHAGOGASTRODUODENOSCOPY (EGD) WITH PROPOFOL;  Surgeon: Manya Silvas, MD;  Location: [facility] ENDOSCOPY;  Service: Endoscopy;  Laterality: N/A;  . EYE SURGERY     laser surgery bilat   . HERNIA REPAIR    . KNEE ARTHROSCOPY    . LUMBAR LAMINECTOMY/DECOMPRESSION MICRODISCECTOMY Bilateral 09/14/2015   Procedure: MICRO LUMBAR BILATERAL DECOMPRESSION L4 - L5;  Surgeon: Susa Day, MD;  Location: [facility] ORS;  Service: Orthopedics;  Laterality: Bilateral;  . REDUCTION MAMMAPLASTY Bilateral 1980  . RHINOPLASTY    . TONSILLECTOMY    . TOTAL KNEE ARTHROPLASTY Left 06/18/2016   Procedure: LEFT TOTAL KNEE ARTHROPLASTY;  Surgeon: Paralee Cancel, MD;  Location: [facility] ORS;  Service: Orthopedics;  Laterality: Left;  Adductor Block  . TUBAL LIGATION    . UVULOPALATOPHARYNGOPLASTY      There were no vitals filed for this visit.  Subjective Assessment - 11/07/17 1023    Subjective  Pt reports that her left knee is "doing good" today. She  complains of 3/10 pain. She rates her low back pain currently as 4/10. HEP is going well. No specific questions or concerns currently.     Pertinent History  Low back pain. Pt states having back surgery about 2-3 years ago. After she had her L TKA, her back started aggravating her due to her walking. Pt also fell about 4 weeks ago onto her L knee. Dr. Alvan Dame checked out her L knee. L knee was fine but has some inflammation.  Pt tries to keep it iced. Pt still recovering for her L TKA but the fall set her back.   Her L knee surgery was last year.  L knee still bothers her a lot.  Had surgery for her back before her L knee surgery. Was doing well until her L knee surgery. The fall made it worse. Feels debilitating. Also has a hard time getting into and out of the car mainly due to her L knee.   Pt fell due to her L knee bucking on her. Pt was trying to pick  something up from the side of the couch.  Also has a hard time getting up from the floor.  Pt states having bladder accidents since after her fall (pt was recommended to tell her MD about it).  Bowel problems since taking medications (was constapated, but after taking medications, pt had diarrhea which was difficult to control. Getting out of the metformin fixed the bowel issue).  Pt states tingling and numbness L lateral LE along the L5 dermatome.  Denies saddle anesthesia.  Pt states that her back problem is mainly on her L side.  Pt landed on her L knee when she fell.  No other falls within the last 6 months. Pt also states being very dizzy since her L knee surgery.  Had PT treatement for her dizziness before which did not help.  The room feels like it is spinning when she gets dizzy.  I feel like it is getting better then she does something.     Patient Stated Goals  Be better able to get into and out of her vehicle (midsized truck), into and out of chairs, be better able to roll in her bed with less L knee pain. Be able to get down on the floor and get back up.     Currently in Pain?  Yes    Pain Score  4     Pain Location  Back   L knee 3/10   Pain Orientation  Lower;Right    Pain Descriptors / Indicators  Spasm;Sharp    Pain Type  Chronic pain    Pain Onset  More than a month ago         TREATMENT   Therapeutic exercise  NuStep L2 x 4 minutes for warm-up during history (2 minutes unbilled); Seated hip adduction pillow squeeze and glute max squeeze 10 x 10 seconds for 2 sets to promote VMO muscle activation Seated L knee extension with 2# ankle weight while performing adduction pillow squeeze 2 x 10; Seated red tband rows 3s hold 2 x 10; Standing reverse lunges with LUE support on railing x 10, extensive cues for proper technique and to avoid excessive L knee flexion; R sidelying clams with manual resistance 2 x 10 (added to HEP); R sidelying L SLR abduction 2 x 10 (added to  HEP); Hooklying bridges x 10;  Improved exercise technique, movement at target joints, use of target muscles after min to mod verbal, visual, tactile  cues.  Manual therapy Seated medial glide L patella grade III, 30s/bout x 3 bouts; Seated STM L vastus lateralis Decreased L knee pain following manual therapy.                         PT Education - 11/07/17 1023    Education Details  exercise form/technique    Person(s) Educated  Patient    Methods  Explanation    Comprehension  Verbalized understanding       PT Short Term Goals - 10/29/17 1846      PT SHORT TERM GOAL #1   Title  Patient will be independent with her HEP to help decrease back and L knee pain and improve ability to perform functional tasks.     Time  3    Period  Weeks    Status  On-going    Target Date  11/21/17        PT Long Term Goals - 10/29/17 1846      PT LONG TERM GOAL #1   Title  Patient will have a decrease in back pain to 3/10 or less at worst to promote ability to ambulate, turn in bed, perform standing tasks with less pain.     Baseline  6/10 back pain at most (09/24/2017); 7-8/10 at most for the past 7 days, duration of pain is better since starting PT (10/29/2017)    Time  8    Period  Weeks    Status  On-going    Target Date  11/21/17      PT LONG TERM GOAL #2   Title  Patient will have a decrease in L knee pain to 3/10 or less at worst to promote ability to ambulate, negotiate stairs, get into and out of a car more comfortably.     Baseline  6/10 L knee pain at most for the past 3 months (09/24/2017); 7-8/10 at most for the past 7 days, duration of pain is better since starting PT (10/29/2017)    Time  8    Period  Weeks    Status  On-going    Target Date  11/21/17      PT LONG TERM GOAL #3   Title  Patient will improve TUG time to 12 seconds or less as a demonstration of improved functional mobility and balance.     Baseline  TUG no AD: 16.05 seconds on average  (09/24/2017); 14.3 seconds on average (10/29/2017)    Time  8    Period  Weeks    Status  Partially Met    Target Date  11/21/17      PT LONG TERM GOAL #4   Title  Patient will improve her back FOTO score by at least 10 points as a demonstration of improved funtion.     Baseline  Back FOTO: 33 (09/24/2017); 38 (10/29/2017)    Time  8    Period  Weeks    Status  On-going    Target Date  11/21/17      PT LONG TERM GOAL #5   Title  Patient will improve bilateral LE strength by at least 1/2 MMT grade to promote ability to perform standing tasks.     Time  8    Period  Weeks    Status  Partially Met    Target Date  11/21/17      PT LONG TERM GOAL #6   Title  Patient will improve her  Modified Oswestry Low Back pain disablity questionnaire by at least 10% as a demonstration of improved function.     Baseline  48% (09/24/2017); 46% (10/29/2017)    Time  8    Period  Weeks    Status  On-going    Target Date  11/21/17            Plan - 11/07/17 1024    Clinical Impression Statement  Pt denies increase in knee or back pain during session. She has notable weakness in L hip external rotation and abduction strength with sidelying exercises. Pt encouraged to continue HEP and follow-up as scheduled.     Rehab Potential  Fair    Clinical Impairments Affecting Rehab Potential  (-) chronicity of condition, multiple areas of pain, medical history, age; (+) motivated, husband support    PT Frequency  2x / week    PT Duration  8 weeks    PT Treatment/Interventions  Therapeutic activities;Therapeutic exercise;Balance training;Neuromuscular re-education;Patient/family education;Manual techniques;Dry needling;Aquatic Therapy;Electrical Stimulation;Iontophoresis 62m/ml Dexamethasone;Gait training    PT Next Visit Plan  hip and knee strengthening, core strengthening, patellar mobility, manual techniques, modalities PRN    Consulted and Agree with Plan of Care  Patient       Patient will benefit from  skilled therapeutic intervention in order to improve the following deficits and impairments:  Pain, Improper body mechanics, Postural dysfunction, Dizziness, Decreased strength, Difficulty walking  Visit Diagnosis: Muscle weakness (generalized)  Chronic pain of left knee  Chronic bilateral low back pain, with sciatica presence unspecified     Problem List Patient Active Problem List   Diagnosis Date Noted  . Obese 06/20/2016  . Status post right knee replacement 06/19/2016  . S/P left TKA 06/18/2016  . Spinal stenosis of lumbar region 09/14/2015   JLyndel SafeHuprich PT, DPT, GCS  Huprich,Jason 11/07/2017, 1:57 PM  [facility] [address] [place] [address]Phone: [phone]  Fax:  [phone] Name: Destiny STOKESMRN: [id number] of Birth: 4December 20, 1952

## 2017-11-07 NOTE — Patient Instructions (Signed)
Access Code: [email or web address]  URL: [email or web address]  Date: 11/07/2017  Prepared by: Roxana Hires   Exercises  Sidelying Hip Abduction - 10 reps - 2 sets - 3 seconds hold - 1x daily - 7x weekly  Clamshell - 10 reps - 2 sets - 3 seconds hold - 1x daily - 7x weekly

## 2017-11-08 ENCOUNTER — Ambulatory Visit: Payer: Medicare Other | Admitting: Speech Pathology

## 2017-11-08 ENCOUNTER — Encounter: Payer: Self-pay | Admitting: Speech Pathology

## 2017-11-08 DIAGNOSIS — M6281 Muscle weakness (generalized): Secondary | ICD-10-CM | POA: Diagnosis not present

## 2017-11-08 DIAGNOSIS — R41841 Cognitive communication deficit: Secondary | ICD-10-CM
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
## 2017-11-11 ENCOUNTER — Ambulatory Visit: Payer: Medicare Other

## 2017-11-12 ENCOUNTER — Ambulatory Visit: Payer: Medicare Other | Admitting: Speech Pathology

## 2017-11-12 ENCOUNTER — Encounter: Payer: Self-pay | Admitting: Speech Pathology

## 2017-11-12 DIAGNOSIS — M6281 Muscle weakness (generalized): Secondary | ICD-10-CM | POA: Diagnosis not present

## 2017-11-12 DIAGNOSIS — R41841 Cognitive communication deficit: Secondary | ICD-10-CM
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
## 2017-11-14 ENCOUNTER — Ambulatory Visit: Payer: Medicare Other

## 2017-11-15 ENCOUNTER — Ambulatory Visit: Payer: Medicare Other | Admitting: Speech Pathology

## 2017-11-18 ENCOUNTER — Ambulatory Visit: Payer: Medicare Other

## 2017-11-18 DIAGNOSIS — R41841 Cognitive communication deficit: Secondary | ICD-10-CM

## 2017-11-18 DIAGNOSIS — M545 Low back pain: Secondary | ICD-10-CM

## 2017-11-18 DIAGNOSIS — Z9181 History of falling: Secondary | ICD-10-CM

## 2017-11-18 DIAGNOSIS — G8929 Other chronic pain: Secondary | ICD-10-CM

## 2017-11-18 DIAGNOSIS — M25562 Pain in left knee: Secondary | ICD-10-CM

## 2017-11-18 DIAGNOSIS — M6281 Muscle weakness (generalized): Secondary | ICD-10-CM | POA: Diagnosis not present

## 2017-11-18 DIAGNOSIS — M5416 Radiculopathy, lumbar region: Secondary | ICD-10-CM

## 2017-11-18 NOTE — Therapy (Signed)
[place] [facility] [address], [place], [address] Phone: [phone]   Fax:  [phone]  Physical Therapy Treatment  Patient Details  Name: Destiny Shaw MRN: [id number] Date of Birth: 09/10/1950 Referring Provider: Paralee Cancel, MD   Encounter Date: 11/18/2017  PT End of Session - 11/18/17 1036    Visit Number  12    Number of Visits  17    Date for PT Re-Evaluation  11/21/17    Authorization Type  3    Authorization Time Period  of 10 progress note Medicare    PT Start Time  1037    PT Stop Time  1104    PT Time Calculation (min)  27 min    Equipment Utilized During Treatment  Gait belt    Activity Tolerance  Patient tolerated treatment well    Behavior During Therapy  WFL for tasks assessed/performed       Past Medical History:  Diagnosis Date  . ADD (attention deficit disorder)   . Anemia   . Anginal pain ([place])    pt states has occas chest pain relates to indigestion; pt uses rest to relieve   . Anxiety   . Arthritis   . Concussion   . Depression   . Diabetes mellitus without complication ([place])   . Dizziness   . Fall   . GERD (gastroesophageal reflux disease)   . Headache   . History of urinary tract infection   . Hyperlipidemia   . Hypertension   . Hypothyroidism   . IBS (irritable bowel syndrome)   . Imbalance   . Numbness    right leg   . Numbness in both hands    comes and goes   . Pneumonia    last episode approx 1 year ago   . Sleep apnea   . Wears glasses     Past Surgical History:  Procedure Laterality Date  . BREAST CYST ASPIRATION Left 1980's   neg  . BREAST CYST EXCISION Right 1980's   neg  . BREAST LUMPECTOMY Right   . BREAST SURGERY    . CARPAL TUNNEL RELEASE    . CESAREAN SECTION     times 2  . COLONOSCOPY WITH PROPOFOL N/A 05/11/2015   Procedure: COLONOSCOPY WITH PROPOFOL;  Surgeon: Manya Silvas, MD;  Location: [facility] ENDOSCOPY;  Service: Endoscopy;   Laterality: N/A;  . DE QUERVAIN'S RELEASE Right   . ESOPHAGOGASTRODUODENOSCOPY (EGD) WITH PROPOFOL N/A 05/11/2015   Procedure: ESOPHAGOGASTRODUODENOSCOPY (EGD) WITH PROPOFOL;  Surgeon: Manya Silvas, MD;  Location: [facility] ENDOSCOPY;  Service: Endoscopy;  Laterality: N/A;  . EYE SURGERY     laser surgery bilat   . HERNIA REPAIR    . KNEE ARTHROSCOPY    . LUMBAR LAMINECTOMY/DECOMPRESSION MICRODISCECTOMY Bilateral 09/14/2015   Procedure: MICRO LUMBAR BILATERAL DECOMPRESSION L4 - L5;  Surgeon: Susa Day, MD;  Location: [facility] ORS;  Service: Orthopedics;  Laterality: Bilateral;  . REDUCTION MAMMAPLASTY Bilateral 1980  . RHINOPLASTY    . TONSILLECTOMY    . TOTAL KNEE ARTHROPLASTY Left 06/18/2016   Procedure: LEFT TOTAL KNEE ARTHROPLASTY;  Surgeon: Paralee Cancel, MD;  Location: [facility] ORS;  Service: Orthopedics;  Laterality: Left;  Adductor Block  . TUBAL LIGATION    . UVULOPALATOPHARYNGOPLASTY      There were no vitals filed for this visit.  Subjective Assessment - 11/18/17 1038    Subjective  L knee is doing better this morning. 4/10 this morning getting  started (7/10 L knee pain at most for the past 7 days). Had been active last week. Pt was walking on a trail. L knee gave way 2x last week. Had sharp pain as well. Pt was trying to walk around trying to do things.  No L knee pain currently.  L knee does not usually give way when walking on level surfaces. 4/10 dull ache in low back (5-6/10 back pain at most for the past 7 days).  Pt states that if she is careful, her knee can do pretty good.  L knee bothers her when she tries to walk on uneven surfaces.  Wears arch supports in her shoe.  Feels better overall but has not felt the improvement due to not being able to attend PT last week.      Pertinent History  Low back pain. Pt states having back surgery about 2-3 years ago. After she had her L TKA, her back started aggravating her due to her walking. Pt also fell about 4 weeks ago onto her L knee. Dr.  Alvan Dame checked out her L knee. L knee was fine but has some inflammation.  Pt tries to keep it iced. Pt still recovering for her L TKA but the fall set her back.   Her L knee surgery was last year.  L knee still bothers her a lot.  Had surgery for her back before her L knee surgery. Was doing well until her L knee surgery. The fall made it worse. Feels debilitating. Also has a hard time getting into and out of the car mainly due to her L knee.   Pt fell due to her L knee bucking on her. Pt was trying to pick something up from the side of the couch.  Also has a hard time getting up from the floor.  Pt states having bladder accidents since after her fall (pt was recommended to tell her MD about it).  Bowel problems since taking medications (was constapated, but after taking medications, pt had diarrhea which was difficult to control. Getting out of the metformin fixed the bowel issue).  Pt states tingling and numbness L lateral LE along the L5 dermatome.  Denies saddle anesthesia.  Pt states that her back problem is mainly on her L side.  Pt landed on her L knee when she fell.  No other falls within the last 6 months. Pt also states being very dizzy since her L knee surgery.  Had PT treatement for her dizziness before which did not help.  The room feels like it is spinning when she gets dizzy.  I feel like it is getting better then she does something.     Patient Stated Goals  Be better able to get into and out of her vehicle (midsized truck), into and out of chairs, be better able to roll in her bed with less L knee pain. Be able to get down on the floor and get back up.     Currently in Pain?  Yes    Pain Score  4    back   Pain Onset  More than a month ago                               PT Education - 11/18/17 1048    Education Details  ther-ex    Person(s) Educated  Patient    Methods  Explanation;Demonstration;Tactile cues;Verbal cues    Comprehension  Returned  demonstration;Verbalized understanding       Objectives    Normal arches in standing     MedbridgeAccess Code: [email or web address]  Therapeutic exercise   Forward step up onto and over a pillow with one UE assist 10x3  Reviewed and given as part of her HEP. Pt demonstrated and verbalized understanding. Handout provided.  Reviewed plan of care: continue 2x/week for 6 more weeks. Pt demonstrated and verbalized understanding.    Session stopped early secondary to a different pt's medical emergency, needing assistance.    Improved exercise technique, movement at target joints, use of target muscles after mod verbal, visual, tactile cues.    Pt demonstrates slight decrease in back pain at worst since last progress report based on pt subjective reports. Worked on L LE strengthening on uneven surface (pillow) to promote ability to ambulate on uneven surfaces such as grass and trails. Pt tolerated session well without aggravation of symptoms. Session ended early today secondary to a different patient having a medical emergency, needing assistance.        PT Short Term Goals - 10/29/17 1846      PT SHORT TERM GOAL #1   Title  Patient will be independent with her HEP to help decrease back and L knee pain and improve ability to perform functional tasks.     Time  3    Period  Weeks    Status  On-going    Target Date  11/21/17        PT Long Term Goals - 10/29/17 1846      PT LONG TERM GOAL #1   Title  Patient will have a decrease in back pain to 3/10 or less at worst to promote ability to ambulate, turn in bed, perform standing tasks with less pain.     Baseline  6/10 back pain at most (09/24/2017); 7-8/10 at most for the past 7 days, duration of pain is better since starting PT (10/29/2017)    Time  8    Period  Weeks    Status  On-going    Target Date  11/21/17      PT LONG TERM GOAL #2   Title  Patient will have a decrease in L knee pain to 3/10 or less at worst to promote  ability to ambulate, negotiate stairs, get into and out of a car more comfortably.     Baseline  6/10 L knee pain at most for the past 3 months (09/24/2017); 7-8/10 at most for the past 7 days, duration of pain is better since starting PT (10/29/2017)    Time  8    Period  Weeks    Status  On-going    Target Date  11/21/17      PT LONG TERM GOAL #3   Title  Patient will improve TUG time to 12 seconds or less as a demonstration of improved functional mobility and balance.     Baseline  TUG no AD: 16.05 seconds on average (09/24/2017); 14.3 seconds on average (10/29/2017)    Time  8    Period  Weeks    Status  Partially Met    Target Date  11/21/17      PT LONG TERM GOAL #4   Title  Patient will improve her back FOTO score by at least 10 points as a demonstration of improved funtion.     Baseline  Back FOTO: 33 (09/24/2017); 38 (10/29/2017)    Time  8    Period  Weeks    Status  On-going    Target Date  11/21/17      PT LONG TERM GOAL #5   Title  Patient will improve bilateral LE strength by at least 1/2 MMT grade to promote ability to perform standing tasks.     Time  8    Period  Weeks    Status  Partially Met    Target Date  11/21/17      PT LONG TERM GOAL #6   Title  Patient will improve her Modified Oswestry Low Back pain disablity questionnaire by at least 10% as a demonstration of improved function.     Baseline  48% (09/24/2017); 46% (10/29/2017)    Time  8    Period  Weeks    Status  On-going    Target Date  11/21/17            Plan - 11/18/17 1038    Clinical Impression Statement  Pt demonstrates slight decrease in back pain at worst since last progress report based on pt subjective reports. Worked on L LE strengthening on uneven surface (pillow) to promote ability to ambulate on uneven surfaces such as grass and trails. Pt tolerated session well without aggravation of symptoms. Session ended early today secondary to a different patient having a medical emergency, needing  assistance.     Rehab Potential  Fair    Clinical Impairments Affecting Rehab Potential  (-) chronicity of condition, multiple areas of pain, medical history, age; (+) motivated, husband support    PT Frequency  2x / week    PT Duration  8 weeks    PT Treatment/Interventions  Therapeutic activities;Therapeutic exercise;Balance training;Neuromuscular re-education;Patient/family education;Manual techniques;Dry needling;Aquatic Therapy;Electrical Stimulation;Iontophoresis 22m/ml Dexamethasone;Gait training    PT Next Visit Plan  hip and knee strengthening, core strengthening, patellar mobility, manual techniques, modalities PRN    Consulted and Agree with Plan of Care  Patient       Patient will benefit from skilled therapeutic intervention in order to improve the following deficits and impairments:  Pain, Improper body mechanics, Postural dysfunction, Dizziness, Decreased strength, Difficulty walking  Visit Diagnosis: Cognitive communication deficit  Chronic pain of left knee  Chronic bilateral low back pain, with sciatica presence unspecified  Radiculopathy, lumbar region  History of falling     Problem List Patient Active Problem List   Diagnosis Date Noted  . Obese 06/20/2016  . Status post right knee replacement 06/19/2016  . S/P left TKA 06/18/2016  . Spinal stenosis of lumbar region 09/14/2015    MJoneen BoersPT, DPT   11/18/2017, 7:29 PM  [facility] [address] [place] [address]Phone: [phone]  Fax:  [phone] Name: RDELOISE MARCHANTMRN: [id number] of Birth: 4January 14, 1952

## 2017-11-18 NOTE — Patient Instructions (Addendum)
  With pillow on top of elastic band on carpet  Hold onto something sturdy for safety and support  Step onto and over the pillow with your left foot, making sure that your knees do not turn in. (3x10)

## 2017-11-19 ENCOUNTER — Ambulatory Visit: Payer: Medicare Other | Admitting: Speech Pathology

## 2017-11-19 ENCOUNTER — Encounter: Payer: Self-pay | Admitting: Speech Pathology

## 2017-11-19 DIAGNOSIS — M6281 Muscle weakness (generalized): Secondary | ICD-10-CM | POA: Diagnosis not present

## 2017-11-19 DIAGNOSIS — R41841 Cognitive communication deficit: Secondary | ICD-10-CM
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
## 2017-11-21 ENCOUNTER — Ambulatory Visit: Payer: Medicare Other

## 2017-11-21 DIAGNOSIS — R42 Dizziness and giddiness: Secondary | ICD-10-CM

## 2017-11-21 DIAGNOSIS — M6281 Muscle weakness (generalized): Secondary | ICD-10-CM | POA: Diagnosis not present

## 2017-11-21 DIAGNOSIS — M5416 Radiculopathy, lumbar region: Secondary | ICD-10-CM

## 2017-11-21 DIAGNOSIS — Z9181 History of falling: Secondary | ICD-10-CM

## 2017-11-21 DIAGNOSIS — M25562 Pain in left knee: Secondary | ICD-10-CM

## 2017-11-21 DIAGNOSIS — G8929 Other chronic pain: Secondary | ICD-10-CM

## 2017-11-21 DIAGNOSIS — M545 Low back pain: Principal | ICD-10-CM

## 2017-11-21 NOTE — Therapy (Signed)
[place] [facility] [address], [place], [address] Phone: [phone]   Fax:  [phone]  Physical Therapy Treatment  Patient Details  Name: Destiny Shaw MRN: [id number] Date of Birth: [birth year] Referring Provider (PT): Paralee Cancel, MD   Encounter Date: 11/21/2017  PT End of Session - 11/21/17 1123    Visit Number  13    Number of Visits  29    Date for PT Re-Evaluation  01/02/18    Authorization Type  4    Authorization Time Period  of 10 progress note Medicare    PT Start Time  1123    PT Stop Time  1208    PT Time Calculation (min)  45 min    Equipment Utilized During Treatment  Gait belt    Activity Tolerance  Patient tolerated treatment well    Behavior During Therapy  WFL for tasks assessed/performed       Past Medical History:  Diagnosis Date  . ADD (attention deficit disorder)   . Anemia   . Anginal pain ([place])    pt states has occas chest pain relates to indigestion; pt uses rest to relieve   . Anxiety   . Arthritis   . Concussion   . Depression   . Diabetes mellitus without complication ([place])   . Dizziness   . Fall   . GERD (gastroesophageal reflux disease)   . Headache   . History of urinary tract infection   . Hyperlipidemia   . Hypertension   . Hypothyroidism   . IBS (irritable bowel syndrome)   . Imbalance   . Numbness    right leg   . Numbness in both hands    comes and goes   . Pneumonia    last episode approx 1 year ago   . Sleep apnea   . Wears glasses     Past Surgical History:  Procedure Laterality Date  . BREAST CYST ASPIRATION Left 1980's   neg  . BREAST CYST EXCISION Right 1980's   neg  . BREAST LUMPECTOMY Right   . BREAST SURGERY    . CARPAL TUNNEL RELEASE    . CESAREAN SECTION     times 2  . COLONOSCOPY WITH PROPOFOL N/A 05/11/2015   Procedure: COLONOSCOPY WITH PROPOFOL;  Surgeon: Manya Silvas, MD;  Location: [facility] ENDOSCOPY;  Service: Endoscopy;   Laterality: N/A;  . DE QUERVAIN'S RELEASE Right   . ESOPHAGOGASTRODUODENOSCOPY (EGD) WITH PROPOFOL N/A 05/11/2015   Procedure: ESOPHAGOGASTRODUODENOSCOPY (EGD) WITH PROPOFOL;  Surgeon: Manya Silvas, MD;  Location: [facility] ENDOSCOPY;  Service: Endoscopy;  Laterality: N/A;  . EYE SURGERY     laser surgery bilat   . HERNIA REPAIR    . KNEE ARTHROSCOPY    . LUMBAR LAMINECTOMY/DECOMPRESSION MICRODISCECTOMY Bilateral 09/14/2015   Procedure: MICRO LUMBAR BILATERAL DECOMPRESSION L4 - L5;  Surgeon: Susa Day, MD;  Location: [facility] ORS;  Service: Orthopedics;  Laterality: Bilateral;  . REDUCTION MAMMAPLASTY Bilateral 1980  . RHINOPLASTY    . TONSILLECTOMY    . TOTAL KNEE ARTHROPLASTY Left 06/18/2016   Procedure: LEFT TOTAL KNEE ARTHROPLASTY;  Surgeon: Paralee Cancel, MD;  Location: [facility] ORS;  Service: Orthopedics;  Laterality: Left;  Adductor Block  . TUBAL LIGATION    . UVULOPALATOPHARYNGOPLASTY      There were no vitals filed for this visit.  Subjective Assessment - 11/21/17 1125    Subjective  L knee is feeling a little better (4/10), back is  still an issue 5-6/10 currently. Back bothers her really bad when she does dishes or work at the sink.  Has been doing her pillow exercise at home.     Pertinent History  Low back pain. Pt states having back surgery about 2-3 years ago. After she had her L TKA, her back started aggravating her due to her walking. Pt also fell about 4 weeks ago onto her L knee. Dr. Alvan Dame checked out her L knee. L knee was fine but has some inflammation.  Pt tries to keep it iced. Pt still recovering for her L TKA but the fall set her back.   Her L knee surgery was last year.  L knee still bothers her a lot.  Had surgery for her back before her L knee surgery. Was doing well until her L knee surgery. The fall made it worse. Feels debilitating. Also has a hard time getting into and out of the car mainly due to her L knee.   Pt fell due to her L knee bucking on her. Pt was trying to pick  something up from the side of the couch.  Also has a hard time getting up from the floor.  Pt states having bladder accidents since after her fall (pt was recommended to tell her MD about it).  Bowel problems since taking medications (was constapated, but after taking medications, pt had diarrhea which was difficult to control. Getting out of the metformin fixed the bowel issue).  Pt states tingling and numbness L lateral LE along the L5 dermatome.  Denies saddle anesthesia.  Pt states that her back problem is mainly on her L side.  Pt landed on her L knee when she fell.  No other falls within the last 6 months. Pt also states being very dizzy since her L knee surgery.  Had PT treatement for her dizziness before which did not help.  The room feels like it is spinning when she gets dizzy.  I feel like it is getting better then she does something.     Patient Stated Goals  Be better able to get into and out of her vehicle (midsized truck), into and out of chairs, be better able to roll in her bed with less L knee pain. Be able to get down on the floor and get back up.     Currently in Pain?  Yes    Pain Score  6    5-6/10 L low back paraspinal area   Pain Onset  More than a month ago         [facility] PT Assessment - 11/21/17 1138      Strength   Right Hip Flexion  4/5    Right Hip Extension  4/5   seated manually resisted hip extension   Right Hip ABduction  4+/5   seated manually resisted clamshell isometrics   Left Hip Flexion  4/5    Left Hip Extension  4/5   seated manually resisted hip extension   Left Hip ABduction  4+/5   seated manually resisted clamshell isometrics   Right Knee Flexion  5/5    Right Knee Extension  5/5    Left Knee Flexion  4/5    Left Knee Extension  5/5   no pain                          PT Education - 11/21/17 1134    Education Details  ther-ex,  progress/current status, plan of care    Person(s) Educated  Patient    Methods   Explanation;Demonstration;Tactile cues;Verbal cues    Comprehension  Returned demonstration;Verbalized understanding        Objectives   MedbridgeAccess Code: [email or web address]  Tense L lumbar paraspinal muscle palpated  Therapeutic exercise    Standing and propping foot on bottom shelf or stool(treadmill platform substitute). Decreased L low back pain.  Standing leg press resisting blue band 10x3 L LE with bilateral UE assist  10x3 R LE with bilateral UE assist  Manually resisted seated hip flexion, seated hip extension, clamshell, knee flexion, knee extension 1-2x each way for each LE  Standing up from a chair, walking 10 ft forward, then returning 10 ft, then sitting back onto chair 3x 13 seconds, 13 seconds, 13 seconds. (13 seconds average) L lateral lead with L turn.   Seated hip adductor ball with naval in and glute max squeeze 10x10 seconds for 2 sets. Decreased back pain  Ergomonic lifting without twisting 10 lbs from chair to elevated mat table  8x  Unsteadiness at times due to lateral lean while either on R or L LE stance phase.    Improved exercise technique, movement at target joints, use of target muscles after mod verbal, visual, tactile cues.   Patient demonstrates slight decrease in back pain at worst since last progress report based on pt subjective report on 11/18/2017. Pt also demonstrates overall improved bilateral LE strength and improved TUG time suggesting improved balance. Continued working on hip and trunk strengthening, and worked on ergonomics to help decrease back and L knee pain when performing tasks. Pt still demonstrates LE weakness, back and L knee pain, and difficulty performing functional tasks and would benefit from continued skilled physical therapy services to address the aforementioned deficits.            PT Short Term Goals - 11/21/17 1223      PT SHORT TERM GOAL #1   Title  Patient will be independent with her HEP to help decrease  back and L knee pain and improve ability to perform functional tasks.     Time  3    Period  Weeks    Status  On-going    Target Date  12/12/17        PT Long Term Goals - 11/21/17 1137      PT LONG TERM GOAL #1   Title  Patient will have a decrease in back pain to 3/10 or less at worst to promote ability to ambulate, turn in bed, perform standing tasks with less pain.     Baseline  6/10 back pain at most (09/24/2017); 7-8/10 at most for the past 7 days, duration of pain is better since starting PT (10/29/2017); 5-6/10 at most for the past 7 days (11/18/2017)    Time  6    Period  Weeks    Status  On-going    Target Date  01/02/18      PT LONG TERM GOAL #2   Title  Patient will have a decrease in L knee pain to 3/10 or less at worst to promote ability to ambulate, negotiate stairs, get into and out of a car more comfortably.     Baseline  6/10 L knee pain at most for the past 3 months (09/24/2017); 7-8/10 at most for the past 7 days, duration of pain is better since starting PT (10/29/2017); 7/10 at most for the past 7 days (11/18/2017)  Time  6    Period  Weeks    Status  On-going    Target Date  01/02/18      PT LONG TERM GOAL #3   Title  Patient will improve TUG time to 12 seconds or less as a demonstration of improved functional mobility and balance.     Baseline  TUG no AD: 16.05 seconds on average (09/24/2017); 14.3 seconds on average (10/29/2017); 13 seconds average (11/21/2017)    Time  6    Period  Weeks    Status  Partially Met    Target Date  01/02/18      PT LONG TERM GOAL #4   Title  Patient will improve her back FOTO score by at least 10 points as a demonstration of improved funtion.     Baseline  Back FOTO: 33 (09/24/2017); 38 (10/29/2017); 36 (11/21/2017)    Time  6    Period  Weeks    Status  On-going    Target Date  01/02/18      PT LONG TERM GOAL #5   Title  Patient will improve bilateral LE strength by at least 1/2 MMT grade to promote ability to perform standing  tasks.     Time  6    Period  Weeks    Status  Partially Met    Target Date  01/02/18      PT LONG TERM GOAL #6   Title  Patient will improve her Modified Oswestry Low Back pain disablity questionnaire by at least 10% as a demonstration of improved function.     Baseline  48% (09/24/2017); 46% (10/29/2017), (11/21/2017)    Time  6    Period  Weeks    Status  On-going    Target Date  01/02/18            Plan - 11/21/17 1135    Clinical Impression Statement  Patient demonstrates slight decrease in back pain at worst since last progress report based on pt subjective report on 11/18/2017. Pt also demonstrates overall improved bilateral LE strength and improved TUG time suggesting improved balance. Continued working on hip and trunk strengthening, and worked on ergonomics to help decrease back and L knee pain when performing tasks. Pt still demonstrates LE weakness, back and L knee pain, and difficulty performing functional tasks and would benefit from continued skilled physical therapy services to address the aforementioned deficits.      History and Personal Factors relevant to plan of care:  Chronic low back pain, L LE radiating symptoms, L knee pain, hx of fall, medical history, LE weakness, difficulty getting into and out of the car, stair negotiatin, bed mobility,  and walking due to back and L knee pain.      Clinical Presentation  Stable    Clinical Presentation due to:  Pt making some progress towards goals    Clinical Decision Making  Low    Rehab Potential  Fair    Clinical Impairments Affecting Rehab Potential  (-) chronicity of condition, multiple areas of pain, medical history, age; (+) motivated, husband support    PT Frequency  2x / week    PT Duration  6 weeks    PT Treatment/Interventions  Therapeutic activities;Therapeutic exercise;Balance training;Neuromuscular re-education;Patient/family education;Manual techniques;Dry needling;Aquatic Therapy;Electrical  Stimulation;Iontophoresis 37m/ml Dexamethasone;Gait training    PT Next Visit Plan  hip and knee strengthening, core strengthening, patellar mobility, manual techniques, modalities PRN    Consulted and Agree with Plan of Care  Patient       Patient will benefit from skilled therapeutic intervention in order to improve the following deficits and impairments:  Pain, Improper body mechanics, Postural dysfunction, Dizziness, Decreased strength, Difficulty walking  Visit Diagnosis: Chronic bilateral low back pain, with sciatica presence unspecified - Plan: PT plan of care cert/re-cert  Chronic pain of left knee - Plan: PT plan of care cert/re-cert  Radiculopathy, lumbar region - Plan: PT plan of care cert/re-cert  History of falling - Plan: PT plan of care cert/re-cert  Muscle weakness (generalized) - Plan: PT plan of care cert/re-cert  Dizziness and giddiness - Plan: PT plan of care cert/re-cert     Problem List Patient Active Problem List   Diagnosis Date Noted  . Obese 06/20/2016  . Status post right knee replacement 06/19/2016  . S/P left TKA 06/18/2016  . Spinal stenosis of lumbar region 09/14/2015    Thank you for your referral.  Joneen Boers PT, DPT   11/21/2017, 12:47 PM  [place] [facility] [address], [place], [address] Phone: [phone]   Fax:  [phone]  Name: Destiny Shaw MRN: [id number] Date of Birth: 1951-01-29

## 2017-11-21 NOTE — Patient Instructions (Signed)
Pt was recommended to prop her foot up when washing dishes to help decrease back pain. Pt demonstrated and verbalized understanding.

## 2017-11-25 ENCOUNTER — Ambulatory Visit: Payer: Medicare Other

## 2017-11-25 DIAGNOSIS — G8929 Other chronic pain: Secondary | ICD-10-CM

## 2017-11-25 DIAGNOSIS — M545 Low back pain: Principal | ICD-10-CM

## 2017-11-25 DIAGNOSIS — M6281 Muscle weakness (generalized): Secondary | ICD-10-CM

## 2017-11-25 NOTE — Therapy (Signed)
[facility] [address] [place], [address] Phone: [phone]   Fax:  [phone]  Physical Therapy Treatment  Patient Details  Name: Destiny Shaw MRN: [id number] Date of Birth: 07-30-1950 Referring Provider (PT): Paralee Cancel, MD   Encounter Date: 11/25/2017  PT End of Session - 11/25/17 1024    Visit Number  14    Number of Visits  29    Date for PT Re-Evaluation  01/02/18    Authorization Type  5    Authorization Time Period  of 10 progress note Medicare    PT Start Time  1030    PT Stop Time  1115    PT Time Calculation (min)  45 min    Equipment Utilized During Treatment  Gait belt    Activity Tolerance  Patient tolerated treatment well    Behavior During Therapy  WFL for tasks assessed/performed       Past Medical History:  Diagnosis Date  . ADD (attention deficit disorder)   . Anemia   . Anginal pain ([place])    pt states has occas chest pain relates to indigestion; pt uses rest to relieve   . Anxiety   . Arthritis   . Concussion   . Depression   . Diabetes mellitus without complication ([place])   . Dizziness   . Fall   . GERD (gastroesophageal reflux disease)   . Headache   . History of urinary tract infection   . Hyperlipidemia   . Hypertension   . Hypothyroidism   . IBS (irritable bowel syndrome)   . Imbalance   . Numbness    right leg   . Numbness in both hands    comes and goes   . Pneumonia    last episode approx 1 year ago   . Sleep apnea   . Wears glasses     Past Surgical History:  Procedure Laterality Date  . BREAST CYST ASPIRATION Left 1980's   neg  . BREAST CYST EXCISION Right 1980's   neg  . BREAST LUMPECTOMY Right   . BREAST SURGERY    . CARPAL TUNNEL RELEASE    . CESAREAN SECTION     times 2  . COLONOSCOPY WITH PROPOFOL N/A 05/11/2015   Procedure: COLONOSCOPY WITH PROPOFOL;  Surgeon: Manya Silvas, MD;  Location: [facility] ENDOSCOPY;  Service: Endoscopy;   Laterality: N/A;  . DE QUERVAIN'S RELEASE Right   . ESOPHAGOGASTRODUODENOSCOPY (EGD) WITH PROPOFOL N/A 05/11/2015   Procedure: ESOPHAGOGASTRODUODENOSCOPY (EGD) WITH PROPOFOL;  Surgeon: Manya Silvas, MD;  Location[facility] ENDOSCOPY;  Service: Endoscopy;  Laterality: N/A;  . EYE SURGERY     laser surgery bilat   . HERNIA REPAIR    . KNEE ARTHROSCOPY    . LUMBAR LAMINECTOMY/DECOMPRESSION MICRODISCECTOMY Bilateral 09/14/2015   Procedure: MICRO LUMBAR BILATERAL DECOMPRESSION L4 - L5;  Surgeon: Susa Day, MD;  Location: [facility] ORS;  Service: Orthopedics;  Laterality: Bilateral;  . REDUCTION MAMMAPLASTY Bilateral 1980  . RHINOPLASTY    . TONSILLECTOMY    . TOTAL KNEE ARTHROPLASTY Left 06/18/2016   Procedure: LEFT TOTAL KNEE ARTHROPLASTY;  Surgeon: Paralee Cancel, MD;  Location: [facility] ORS;  Service: Orthopedics;  Laterality: Left;  Adductor Block  . TUBAL LIGATION    . UVULOPALATOPHARYNGOPLASTY      There were no vitals filed for this visit.  Subjective Assessment - 11/25/17 1024    Subjective  Pt states that she started having significant back pain which  started on Saturday and ran all the way from her neck to her tailbone. All the pain is on the left side of her back. No known trigger for onset. "I haven't had it this bad in 20-25 years." She rates her current pain as a 8/10.     Pertinent History  Low back pain. Pt states having back surgery about 2-3 years ago. After she had her L TKA, her back started aggravating her due to her walking. Pt also fell about 4 weeks ago onto her L knee. Dr. Alvan Dame checked out her L knee. L knee was fine but has some inflammation.  Pt tries to keep it iced. Pt still recovering for her L TKA but the fall set her back.   Her L knee surgery was last year.  L knee still bothers her a lot.  Had surgery for her back before her L knee surgery. Was doing well until her L knee surgery. The fall made it worse. Feels debilitating. Also has a hard time getting into and out of the car  mainly due to her L knee.   Pt fell due to her L knee bucking on her. Pt was trying to pick something up from the side of the couch.  Also has a hard time getting up from the floor.  Pt states having bladder accidents since after her fall (pt was recommended to tell her MD about it).  Bowel problems since taking medications (was constapated, but after taking medications, pt had diarrhea which was difficult to control. Getting out of the metformin fixed the bowel issue).  Pt states tingling and numbness L lateral LE along the L5 dermatome.  Denies saddle anesthesia.  Pt states that her back problem is mainly on her L side.  Pt landed on her L knee when she fell.  No other falls within the last 6 months. Pt also states being very dizzy since her L knee surgery.  Had PT treatement for her dizziness before which did not help.  The room feels like it is spinning when she gets dizzy.  I feel like it is getting better then she does something.     Patient Stated Goals  Be better able to get into and out of her vehicle (midsized truck), into and out of chairs, be better able to roll in her bed with less L knee pain. Be able to get down on the floor and get back up.     Currently in Pain?  Yes    Pain Score  8     Pain Location  Back    Pain Orientation  Mid;Left;Lower;Upper    Pain Onset  More than a month ago            TREATMENT  Ther-ex  Hooklying lumbar rocking within pain-free range, slowly increasing range; Hooklying adductor ball squeeze 5s hold 2 x 10; Hooklying belt-resisted clams 5s hold 2 x 10;  Manual Therapy  L HS stretch 30s hold x 2; L single knee to chest stretch 30s hold x 2; Belt assisted lumbar manual traction 20s hold, 10s as well as oscillation, pt reports relief of low and mid back tension;  Electrical Stimulation  HiVolt e-stim applied to L upper trap as well as L rhomboid major/minor based on patient's subjective reports of the location of her worst pain. Intensity based on  patient's tolerance which is 410V for upper trap and 370V for rhomboids. Skin assessed afterward without sign of superficial irritation.  Pt educated throughout session about proper posture and technique with exercises. Improved exercise technique, movement at target joints, use of target muscles after min to mod verbal, visual, tactile cues.   Session today focused on patient's pain as it is significantly higher today due to increase over the weekend. She reports significant relief from lumbar rocking, stretching, and belt-assessed lumbar traction. At the end of the session pt rates her pain as 4/10 along her entire back except her neck pain is still an 8/10. Pt encouraged to continue HEP and follow-up as scheduled. Pt will benefit from PT services to address deficits in strength and pain in order to return to full pain-free function at home.                      PT Short Term Goals - 11/21/17 1223      PT SHORT TERM GOAL #1   Title  Patient will be independent with her HEP to help decrease back and L knee pain and improve ability to perform functional tasks.     Time  3    Period  Weeks    Status  On-going    Target Date  12/12/17        PT Long Term Goals - 11/21/17 1137      PT LONG TERM GOAL #1   Title  Patient will have a decrease in back pain to 3/10 or less at worst to promote ability to ambulate, turn in bed, perform standing tasks with less pain.     Baseline  6/10 back pain at most (09/24/2017); 7-8/10 at most for the past 7 days, duration of pain is better since starting PT (10/29/2017); 5-6/10 at most for the past 7 days (11/18/2017)    Time  6    Period  Weeks    Status  On-going    Target Date  01/02/18      PT LONG TERM GOAL #2   Title  Patient will have a decrease in L knee pain to 3/10 or less at worst to promote ability to ambulate, negotiate stairs, get into and out of a car more comfortably.     Baseline  6/10 L knee pain at most for the past 3  months (09/24/2017); 7-8/10 at most for the past 7 days, duration of pain is better since starting PT (10/29/2017); 7/10 at most for the past 7 days (11/18/2017)    Time  6    Period  Weeks    Status  On-going    Target Date  01/02/18      PT LONG TERM GOAL #3   Title  Patient will improve TUG time to 12 seconds or less as a demonstration of improved functional mobility and balance.     Baseline  TUG no AD: 16.05 seconds on average (09/24/2017); 14.3 seconds on average (10/29/2017); 13 seconds average (11/21/2017)    Time  6    Period  Weeks    Status  Partially Met    Target Date  01/02/18      PT LONG TERM GOAL #4   Title  Patient will improve her back FOTO score by at least 10 points as a demonstration of improved funtion.     Baseline  Back FOTO: 33 (09/24/2017); 38 (10/29/2017); 36 (11/21/2017)    Time  6    Period  Weeks    Status  On-going    Target Date  01/02/18  PT LONG TERM GOAL #5   Title  Patient will improve bilateral LE strength by at least 1/2 MMT grade to promote ability to perform standing tasks.     Time  6    Period  Weeks    Status  Partially Met    Target Date  01/02/18      PT LONG TERM GOAL #6   Title  Patient will improve her Modified Oswestry Low Back pain disablity questionnaire by at least 10% as a demonstration of improved function.     Baseline  48% (09/24/2017); 46% (10/29/2017), (11/21/2017)    Time  6    Period  Weeks    Status  On-going    Target Date  01/02/18            Plan - 11/25/17 1025    Clinical Impression Statement  Session today focused on patient's pain as it is significantly higher today due to increase over the weekend. She reports significant relief from lumbar rocking, stretching, and belt-assessed lumbar traction. At the end of the session pt rates her pain as 4/10 along her entire back except her neck pain is still an 8/10. Pt encouraged to continue HEP and follow-up as scheduled. Pt will benefit from PT services to address deficits  in strength and pain in order to return to full pain-free function at home.     Rehab Potential  Fair    Clinical Impairments Affecting Rehab Potential  (-) chronicity of condition, multiple areas of pain, medical history, age; (+) motivated, husband support    PT Frequency  2x / week    PT Duration  6 weeks    PT Treatment/Interventions  Therapeutic activities;Therapeutic exercise;Balance training;Neuromuscular re-education;Patient/family education;Manual techniques;Dry needling;Aquatic Therapy;Electrical Stimulation;Iontophoresis 76m/ml Dexamethasone;Gait training    PT Next Visit Plan  hip and knee strengthening, core strengthening, patellar mobility, manual techniques, modalities PRN    Consulted and Agree with Plan of Care  Patient       Patient will benefit from skilled therapeutic intervention in order to improve the following deficits and impairments:  Pain, Improper body mechanics, Postural dysfunction, Dizziness, Decreased strength, Difficulty walking  Visit Diagnosis: Chronic bilateral low back pain, with sciatica presence unspecified  Muscle weakness (generalized)     Problem List Patient Active Problem List   Diagnosis Date Noted  . Obese 06/20/2016  . Status post right knee replacement 06/19/2016  . S/P left TKA 06/18/2016  . Spinal stenosis of lumbar region 09/14/2015   JLyndel SafeHuprich PT, DPT, GCS  Huprich,Jason 11/25/2017, 11:45 AM  [facility] [address] [place] [address]Phone: [phone]  Fax:  [phone] Name: RAMATULLAH CHRISTYMRN: [id number] of Birth: [birth year]

## 2017-11-26 ENCOUNTER — Ambulatory Visit: Payer: Medicare Other | Attending: Orthopedic Surgery | Admitting: Speech Pathology

## 2017-11-26 DIAGNOSIS — G8929 Other chronic pain: Secondary | ICD-10-CM | POA: Insufficient documentation

## 2017-11-26 DIAGNOSIS — Z9181 History of falling: Secondary | ICD-10-CM | POA: Insufficient documentation

## 2017-11-26 DIAGNOSIS — M5416 Radiculopathy, lumbar region: Secondary | ICD-10-CM | POA: Diagnosis present

## 2017-11-26 DIAGNOSIS — R41841 Cognitive communication deficit: Secondary | ICD-10-CM | POA: Diagnosis not present

## 2017-11-26 DIAGNOSIS — M6281 Muscle weakness (generalized): Secondary | ICD-10-CM | POA: Insufficient documentation

## 2017-11-26 DIAGNOSIS — M25562 Pain in left knee: Secondary | ICD-10-CM | POA: Insufficient documentation

## 2017-11-26 DIAGNOSIS — R42 Dizziness and giddiness: Secondary | ICD-10-CM | POA: Insufficient documentation

## 2017-11-27 ENCOUNTER — Encounter: Payer: Self-pay | Admitting: Speech Pathology
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
## 2017-11-28 ENCOUNTER — Ambulatory Visit: Payer: Medicare Other

## 2017-11-28 DIAGNOSIS — M6281 Muscle weakness (generalized): Secondary | ICD-10-CM

## 2017-11-28 DIAGNOSIS — M5416 Radiculopathy, lumbar region: Secondary | ICD-10-CM

## 2017-11-28 DIAGNOSIS — M25562 Pain in left knee: Secondary | ICD-10-CM

## 2017-11-28 DIAGNOSIS — G8929 Other chronic pain: Secondary | ICD-10-CM

## 2017-11-28 DIAGNOSIS — R41841 Cognitive communication deficit: Secondary | ICD-10-CM | POA: Diagnosis not present

## 2017-11-28 NOTE — Therapy (Signed)
[facility] [address], [place], [address] Phone: [phone]   Fax:  [phone]  Physical Therapy Treatment  Patient Details  Name: Destiny Shaw MRN: [id number] Date of Birth: [birth year] Referring Provider (PT): Paralee Cancel, MD   Encounter Date: 11/28/2017  PT End of Session - 11/28/17 0956    Visit Number  15    Number of Visits  29    Date for PT Re-Evaluation  01/02/18    Authorization Type  6    Authorization Time Period  of 10 progress note Medicare    PT Start Time  0958    PT Stop Time  1032    PT Time Calculation (min)  34 min    Equipment Utilized During Treatment  Gait belt    Activity Tolerance  Patient tolerated treatment well    Behavior During Therapy  [facility] for tasks assessed/performed       Past Medical History:  Diagnosis Date  . ADD (attention deficit disorder)   . Anemia   . Anginal pain ([place])    pt states has occas chest pain relates to indigestion; pt uses rest to relieve   . Anxiety   . Arthritis   . Concussion   . Depression   . Diabetes mellitus without complication ([place])   . Dizziness   . Fall   . GERD (gastroesophageal reflux disease)   . Headache   . History of urinary tract infection   . Hyperlipidemia   . Hypertension   . Hypothyroidism   . IBS (irritable bowel syndrome)   . Imbalance   . Numbness    right leg   . Numbness in both hands    comes and goes   . Pneumonia    last episode approx 1 year ago   . Sleep apnea   . Wears glasses     Past Surgical History:  Procedure Laterality Date  . BREAST CYST ASPIRATION Left 1980's   neg  . BREAST CYST EXCISION Right 1980's   neg  . BREAST LUMPECTOMY Right   . BREAST SURGERY    . CARPAL TUNNEL RELEASE    . CESAREAN SECTION     times 2  . COLONOSCOPY WITH PROPOFOL N/A 05/11/2015   Procedure: COLONOSCOPY WITH PROPOFOL;  Surgeon: Manya Silvas, MD;  Location[facility] END[facility]COPY;  Service: Endoscopy;   Laterality: N/A;  . DE QUERVAIN'S RELEASE Right   . ESOPHAGOGASTRODUODENOSCOPY (EGD) WITH PROPOFOL N/A 05/11/2015   Procedure: ESOPHAGOGASTRODUODENOSCOPY (EGD) WITH PROPOFOL;  Surgeon: Manya Silvas, MD;  Location: [facility] ENDOSCOPY;  Service: Endoscopy;  Laterality: N/A;  . EYE SURGERY     laser surgery bilat   . HERNIA REPAIR    . KNEE ARTHROSCOPY    . LUMBAR LAMINECTOMY/DECOMPRESSION MICRODISCECTOMY Bilateral 09/14/2015   Procedure: MICRO LUMBAR BILATERAL DECOMPRESSION L4 - L5;  Surgeon: Susa Day, MD;  Location: [facility] ORS;  Service: Orthopedics;  Laterality: Bilateral;  . REDUCTION MAMMAPLASTY Bilateral 1980  . RHINOPLASTY    . TONSILLECTOMY    . TOTAL KNEE ARTHROPLASTY Left 06/18/2016   Procedure: LEFT TOTAL KNEE ARTHROPLASTY;  Surgeon: Paralee Cancel, MD;  Location: [facility] ORS;  Service: Orthopedics;  Laterality: Left;  Adductor Block  . TUBAL LIGATION    . UVULOPALATOPHARYNGOPLASTY      There were no vitals filed for this visit.  Subjective Assessment - 11/28/17 0959    Subjective  Does not know what happened. Had a bunch of muscle  cramps in her back. L knee seems a little off. Does not know what she did wrong. It all started last Saturday morning. Also went to a country music concert at [employer] but got worse and worse as far as contractions in her back. Did not do any dancing.  Does not know what caused it. Does not know if she was trying to walk down a hill .  Has not had time to see her PCP.  The PT exercises has helped with her urinary problems.  5/10 back pain currently. At night, her back is different. Has back spasms and L knee pain.  The pain increases in the afternoon.  The country music concert was not more activity than she normally does.  Her back felt more tense before going to the concert.  Does not do her HEP when her back bothers her.     Pertinent History  Low back pain. Pt states having back surgery about 2-3 years ago. After she had her L TKA, her back started  aggravating her due to her walking. Pt also fell about 4 weeks ago onto her L knee. Dr. Alvan Dame checked out her L knee. L knee was fine but has some inflammation.  Pt tries to keep it iced. Pt still recovering for her L TKA but the fall set her back.   Her L knee surgery was last year.  L knee still bothers her a lot.  Had surgery for her back before her L knee surgery. Was doing well until her L knee surgery. The fall made it worse. Feels debilitating. Also has a hard time getting into and out of the car mainly due to her L knee.   Pt fell due to her L knee bucking on her. Pt was trying to pick something up from the side of the couch.  Also has a hard time getting up from the floor.  Pt states having bladder accidents since after her fall (pt was recommended to tell her MD about it).  Bowel problems since taking medications (was constapated, but after taking medications, pt had diarrhea which was difficult to control. Getting out of the metformin fixed the bowel issue).  Pt states tingling and numbness L lateral LE along the L5 dermatome.  Denies saddle anesthesia.  Pt states that her back problem is mainly on her L side.  Pt landed on her L knee when she fell.  No other falls within the last 6 months. Pt also states being very dizzy since her L knee surgery.  Had PT treatement for her dizziness before which did not help.  The room feels like it is spinning when she gets dizzy.  I feel like it is getting better then she does something.     Patient Stated Goals  Be better able to get into and out of her vehicle (midsized truck), into and out of chairs, be better able to roll in her bed with less L knee pain. Be able to get down on the floor and get back up.     Currently in Pain?  Yes    Pain Score  5     Pain Onset  More than a month ago                               PT Education - 11/28/17 1008    Education Details  ther-ex    [employer]) Educated  Patient  Methods   Explanation;Demonstration;Tactile cues;Verbal cues    Comprehension  Returned demonstration;Verbalized understanding        Objectives   MedbridgeAccess Code: [email or web address]  Tense L lumbar paraspinal muscle palpated  Therapeutic exercise   Time taken to listen to pt subjective report to determine appropriate exercises to help with back pain and continue progress  Pt to bring her binder of PT HEP for review on what exercises to perform when her back botheres her next visit.   Seated glute max squeeze 10x3 with 5 second holds   Also with posterior pelvic tilt. Pt states the exercise helps alleviate a bunch of stuff. Decreased back pain from 5-6/10 to 3-4/10   Seated trunk flexion 10x10 second holds to decrease low back pressure.   Seated naval ins 10x10 seconds  Seated bilateral shoulder extension isometrics, hands on thighs, 10x10 seconds  Standing glute max squeeze 10x10 seconds  Cues to decrease back extension.   Pt was recommended to perform aforementioned exercises when her back bothers her. Pt demonstrated and verbalized understanding. Handout provided.   Gait with glute max squeeze. Decreased back pain when walking. Pt was recommended to activate her glutes when walking. Pt verbalized understanding. (pt demonstrates tendency for back ward lean when walking observed).    Improved exercise technique, movement at target joints, use of target muscles after mod verbal, visual, tactile cues.    Decreased back pain with exercises to decrease low back extension pressure. Continued working on trunk, and glute muscle actiation to help decrease back pain. Decreased back pain to 3/10 at end of session.         PT Short Term Goals - 11/21/17 1223      PT SHORT TERM GOAL #1   Title  Patient will be independent with her HEP to help decrease back and L knee pain and improve ability to perform functional tasks.     Time  3    Period  Weeks    Status  On-going    Target Date   12/12/17        PT Long Term Goals - 11/21/17 1137      PT LONG TERM GOAL #1   Title  Patient will have a decrease in back pain to 3/10 or less at worst to promote ability to ambulate, turn in bed, perform standing tasks with less pain.     Baseline  6/10 back pain at most (09/24/2017); 7-8/10 at most for the past 7 days, duration of pain is better since starting PT (10/29/2017); 5-6/10 at most for the past 7 days (11/18/2017)    Time  6    Period  Weeks    Status  On-going    Target Date  01/02/18      PT LONG TERM GOAL #2   Title  Patient will have a decrease in L knee pain to 3/10 or less at worst to promote ability to ambulate, negotiate stairs, get into and out of a car more comfortably.     Baseline  6/10 L knee pain at most for the past 3 months (09/24/2017); 7-8/10 at most for the past 7 days, duration of pain is better since starting PT (10/29/2017); 7/10 at most for the past 7 days (11/18/2017)    Time  6    Period  Weeks    Status  On-going    Target Date  01/02/18      PT LONG TERM GOAL #3   Title  Patient will improve  TUG time to 12 seconds or less as a demonstration of improved functional mobility and balance.     Baseline  TUG no AD: 16.05 seconds on average (09/24/2017); 14.3 seconds on average (10/29/2017); 13 seconds average (11/21/2017)    Time  6    Period  Weeks    Status  Partially Met    Target Date  01/02/18      PT LONG TERM GOAL #4   Title  Patient will improve her back FOTO score by at least 10 points as a demonstration of improved funtion.     Baseline  Back FOTO: 33 (09/24/2017); 38 (10/29/2017); 36 (11/21/2017)    Time  6    Period  Weeks    Status  On-going    Target Date  01/02/18      PT LONG TERM GOAL #5   Title  Patient will improve bilateral LE strength by at least 1/2 MMT grade to promote ability to perform standing tasks.     Time  6    Period  Weeks    Status  Partially Met    Target Date  01/02/18      PT LONG TERM GOAL #6   Title  Patient will  improve her Modified Oswestry Low Back pain disablity questionnaire by at least 10% as a demonstration of improved function.     Baseline  48% (09/24/2017); 46% (10/29/2017), (11/21/2017)    Time  6    Period  Weeks    Status  On-going    Target Date  01/02/18            Plan - 11/28/17 1008    Clinical Impression Statement  Decreased back pain with exercises to decrease low back extension pressure. Continued working on trunk, and glute muscle actiation to help decrease back pain. Decreased back pain to 3/10 at end of session.     Rehab Potential  Fair    Clinical Impairments Affecting Rehab Potential  (-) chronicity of condition, multiple areas of pain, medical history, age; (+) motivated, husband support    PT Frequency  2x / week    PT Duration  6 weeks    PT Treatment/Interventions  Therapeutic activities;Therapeutic exercise;Balance training;Neuromuscular re-education;Patient/family education;Manual techniques;Dry needling;Aquatic Therapy;Electrical Stimulation;Iontophoresis 4m/ml Dexamethasone;Gait training    PT Next Visit Plan  hip and knee strengthening, core strengthening, patellar mobility, manual techniques, modalities PRN    Consulted and Agree with Plan of Care  Patient       Patient will benefit from skilled therapeutic intervention in order to improve the following deficits and impairments:  Pain, Improper body mechanics, Postural dysfunction, Dizziness, Decreased strength, Difficulty walking  Visit Diagnosis: Muscle weakness (generalized)  Chronic pain of left knee  Radiculopathy, lumbar region     Problem List Patient Active Problem List   Diagnosis Date Noted  . Obese 06/20/2016  . Status post right knee replacement 06/19/2016  . S/P left TKA 06/18/2016  . Spinal stenosis of lumbar region 09/14/2015    MJoneen BoersPT, DPT   11/28/2017, 12:09 PM  [facility] [address] [place] [address]Phone: [phone]  Fax:  [phone] Name: RTAHIRAH SARAMRN: [id number] of Birth: [birth year]

## 2017-11-28 NOTE — Patient Instructions (Addendum)
Seated glute max squeeze  Sitting on a chair  Squeeze your rear end muscles together.   Hold for 5 seconds  Repeat 10 times  Perform at least 3 sets daily.     Seated trunk flexion  Sitting on a chair, gently lean forward to feel a stretch to your low back   Hold for 10 seconds.   Repeat 10 times,    Perform 3 sets daily.    Seated naval ins   Gently pull your belly button in  Hold for 10 seconds  Repeat 10 times  Perform 3 sets daily.     Seated bilateral shoulder extension isometrics  Sitting on a chair, press your hands on your thighs to feel muscles in your abdomen contract.    Hold for 10 seconds.    Repeat 10 times    Perform 3 sets daily   Standing glute max squeeze  Standing, squeeze your rear end muscles together  Do not lean back  Hold for 10 seconds   Repeat 10 times   Perform 3 sets daily      PELVIC TILT: Posterior    Tighten abdominals, flatten low back.  Hold for 5 seconds. _10__ reps per set, __3_ sets per day

## 2017-11-29 ENCOUNTER — Encounter: Payer: Self-pay | Admitting: Speech Pathology

## 2017-11-29 ENCOUNTER — Ambulatory Visit: Payer: Medicare Other | Admitting: Speech Pathology

## 2017-11-29 DIAGNOSIS — R41841 Cognitive communication deficit: Secondary | ICD-10-CM | POA: Diagnosis not present
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
## 2017-12-03 ENCOUNTER — Ambulatory Visit: Payer: Medicare Other

## 2017-12-03 ENCOUNTER — Ambulatory Visit: Payer: Medicare Other | Admitting: Speech Pathology

## 2017-12-03 DIAGNOSIS — Z9181 History of falling: Secondary | ICD-10-CM

## 2017-12-03 DIAGNOSIS — M5416 Radiculopathy, lumbar region: Secondary | ICD-10-CM

## 2017-12-03 DIAGNOSIS — M6281 Muscle weakness (generalized): Secondary | ICD-10-CM

## 2017-12-03 DIAGNOSIS — G8929 Other chronic pain: Secondary | ICD-10-CM

## 2017-12-03 DIAGNOSIS — R41841 Cognitive communication deficit: Secondary | ICD-10-CM

## 2017-12-03 DIAGNOSIS — M25562 Pain in left knee: Secondary | ICD-10-CM

## 2017-12-03 NOTE — Therapy (Signed)
[facility] [address] [place], [address] Phone: [phone]   Fax:  [phone]  Physical Therapy Treatment  Patient Details  Name: Destiny Shaw MRN: [id number] Date of Birth: [birth year] Referring Provider (PT): Paralee Cancel, MD   Encounter Date: 12/03/2017  PT End of Session - 12/03/17 1038    Visit Number  16    Number of Visits  29    Date for PT Re-Evaluation  01/02/18    Authorization Type  7    Authorization Time Period  of 10 progress note Medicare    PT Start Time  1038   pt arrived late   PT Stop Time  1120    PT Time Calculation (min)  42 min    Equipment Utilized During Treatment  Gait belt    Activity Tolerance  Patient tolerated treatment well    Behavior During Therapy  WFL for tasks assessed/performed       Past Medical History:  Diagnosis Date  . ADD (attention deficit disorder)   . Anemia   . Anginal pain ([place])    pt states has occas chest pain relates to indigestion; pt uses rest to relieve   . Anxiety   . Arthritis   . Concussion   . Depression   . Diabetes mellitus without complication ([place])   . Dizziness   . Fall   . GERD (gastroesophageal reflux disease)   . Headache   . History of urinary tract infection   . Hyperlipidemia   . Hypertension   . Hypothyroidism   . IBS (irritable bowel syndrome)   . Imbalance   . Numbness    right leg   . Numbness in both hands    comes and goes   . Pneumonia    last episode approx 1 year ago   . Sleep apnea   . Wears glasses     Past Surgical History:  Procedure Laterality Date  . BREAST CYST ASPIRATION Left 1980's   neg  . BREAST CYST EXCISION Right 1980's   neg  . BREAST LUMPECTOMY Right   . BREAST SURGERY    . CARPAL TUNNEL RELEASE    . CESAREAN SECTION     times 2  . COLONOSCOPY WITH PROPOFOL N/A 05/11/2015   Procedure: COLONOSCOPY WITH PROPOFOL;  Surgeon: Manya Silvas, MD;  Location: [facility] ENDOSCOPY;   Service: Endoscopy;  Laterality: N/A;  . DE QUERVAIN'S RELEASE Right   . ESOPHAGOGASTRODUODENOSCOPY (EGD) WITH PROPOFOL N/A 05/11/2015   Procedure: ESOPHAGOGASTRODUODENOSCOPY (EGD) WITH PROPOFOL;  Surgeon: Manya Silvas, MD;  Location: [facility] ENDOSCOPY;  Service: Endoscopy;  Laterality: N/A;  . EYE SURGERY     laser surgery bilat   . HERNIA REPAIR    . KNEE ARTHROSCOPY    . LUMBAR LAMINECTOMY/DECOMPRESSION MICRODISCECTOMY Bilateral 09/14/2015   Procedure: MICRO LUMBAR BILATERAL DECOMPRESSION L4 - L5;  Surgeon: Susa Day, MD;  Location: [facility] ORS;  Service: Orthopedics;  Laterality: Bilateral;  . REDUCTION MAMMAPLASTY Bilateral 1980  . RHINOPLASTY    . TONSILLECTOMY    . TOTAL KNEE ARTHROPLASTY Left 06/18/2016   Procedure: LEFT TOTAL KNEE ARTHROPLASTY;  Surgeon: Paralee Cancel, MD;  Location: [facility] ORS;  Service: Orthopedics;  Laterality: Left;  Adductor Block  . TUBAL LIGATION    . UVULOPALATOPHARYNGOPLASTY      There were no vitals filed for this visit.  Subjective Assessment - 12/03/17 1039    Subjective  L upper trap stiffness. Has an  ache in low back which goes into spasm periodically throughout the day. 5/10 ache at the moment. Felt better after last session until bed time.  Did not do her exercises before bed.  L knee has not felt like giving way but feels an ache.  The giving way is better.  3-4/10 L knee ache currently.     Pertinent History  Low back pain. Pt states having back surgery about 2-3 years ago. After she had her L TKA, her back started aggravating her due to her walking. Pt also fell about 4 weeks ago onto her L knee. Dr. Alvan Dame checked out her L knee. L knee was fine but has some inflammation.  Pt tries to keep it iced. Pt still recovering for her L TKA but the fall set her back.   Her L knee surgery was last year.  L knee still bothers her a lot.  Had surgery for her back before her L knee surgery. Was doing well until her L knee surgery. The fall made it worse. Feels  debilitating. Also has a hard time getting into and out of the car mainly due to her L knee.   Pt fell due to her L knee bucking on her. Pt was trying to pick something up from the side of the couch.  Also has a hard time getting up from the floor.  Pt states having bladder accidents since after her fall (pt was recommended to tell her MD about it).  Bowel problems since taking medications (was constapated, but after taking medications, pt had diarrhea which was difficult to control. Getting out of the metformin fixed the bowel issue).  Pt states tingling and numbness L lateral LE along the L5 dermatome.  Denies saddle anesthesia.  Pt states that her back problem is mainly on her L side.  Pt landed on her L knee when she fell.  No other falls within the last 6 months. Pt also states being very dizzy since her L knee surgery.  Had PT treatement for her dizziness before which did not help.  The room feels like it is spinning when she gets dizzy.  I feel like it is getting better then she does something.     Patient Stated Goals  Be better able to get into and out of her vehicle (midsized truck), into and out of chairs, be better able to roll in her bed with less L knee pain. Be able to get down on the floor and get back up.     Currently in Pain?  Yes    Pain Score  5     Pain Onset  More than a month ago                               PT Education - 12/03/17 1119    Education Details  ther-ex, HEP    Person(s) Educated  Patient    Methods  Explanation;Demonstration;Tactile cues;Verbal cues    Comprehension  Returned demonstration;Verbalized understanding        Objectives   MedbridgeAccess Code: [email or web address]  Tense L lumbar paraspinal muscle palpated  Therapeutic exercise  Time spent organizing and reviewing all HEP in binder for pt.   Removed extra copies of HEP, and organized HEP from supine to sit to standing positions.   Pt was recommended to perform part of  her HEP prior to going to bed to help decrease back pain at  night. Pt verbalized understanding.   physioball rolls  Flexion 10x5 seconds for 2 sets   R 10x5 seconds for 2 sets  L 10x5 seconds for 2 sets   Seated bilateral shoulder extension isometrics with glute max squeeze 10x5 seconds for 3 sets    Improved exercise technique, movement at target joints, use of target muscles after min to mod verbal, visual, tactile cues.    Continued working on decreasing low back extension pressure, improving trunk and glute max muscle activation. Decreased back ache, increased back fatigue after session.     PT Short Term Goals - 11/21/17 1223      PT SHORT TERM GOAL #1   Title  Patient will be independent with her HEP to help decrease back and L knee pain and improve ability to perform functional tasks.     Time  3    Period  Weeks    Status  On-going    Target Date  12/12/17        PT Long Term Goals - 11/21/17 1137      PT LONG TERM GOAL #1   Title  Patient will have a decrease in back pain to 3/10 or less at worst to promote ability to ambulate, turn in bed, perform standing tasks with less pain.     Baseline  6/10 back pain at most (09/24/2017); 7-8/10 at most for the past 7 days, duration of pain is better since starting PT (10/29/2017); 5-6/10 at most for the past 7 days (11/18/2017)    Time  6    Period  Weeks    Status  On-going    Target Date  01/02/18      PT LONG TERM GOAL #2   Title  Patient will have a decrease in L knee pain to 3/10 or less at worst to promote ability to ambulate, negotiate stairs, get into and out of a car more comfortably.     Baseline  6/10 L knee pain at most for the past 3 months (09/24/2017); 7-8/10 at most for the past 7 days, duration of pain is better since starting PT (10/29/2017); 7/10 at most for the past 7 days (11/18/2017)    Time  6    Period  Weeks    Status  On-going    Target Date  01/02/18      PT LONG TERM GOAL #3   Title  Patient will  improve TUG time to 12 seconds or less as a demonstration of improved functional mobility and balance.     Baseline  TUG no AD: 16.05 seconds on average (09/24/2017); 14.3 seconds on average (10/29/2017); 13 seconds average (11/21/2017)    Time  6    Period  Weeks    Status  Partially Met    Target Date  01/02/18      PT LONG TERM GOAL #4   Title  Patient will improve her back FOTO score by at least 10 points as a demonstration of improved funtion.     Baseline  Back FOTO: 33 (09/24/2017); 38 (10/29/2017); 36 (11/21/2017)    Time  6    Period  Weeks    Status  On-going    Target Date  01/02/18      PT LONG TERM GOAL #5   Title  Patient will improve bilateral LE strength by at least 1/2 MMT grade to promote ability to perform standing tasks.     Time  6    Period  Weeks  Status  Partially Met    Target Date  01/02/18      PT LONG TERM GOAL #6   Title  Patient will improve her Modified Oswestry Low Back pain disablity questionnaire by at least 10% as a demonstration of improved function.     Baseline  48% (09/24/2017); 46% (10/29/2017), (11/21/2017)    Time  6    Period  Weeks    Status  On-going    Target Date  01/02/18            Plan - 12/03/17 1119    Clinical Impression Statement  Continued working on decreasing low back extension pressure, improving trunk and glute max muscle activation. Decreased back ache, increased back fatigue after session.     Rehab Potential  Fair    Clinical Impairments Affecting Rehab Potential  (-) chronicity of condition, multiple areas of pain, medical history, age; (+) motivated, husband support    PT Frequency  2x / week    PT Duration  6 weeks    PT Treatment/Interventions  Therapeutic activities;Therapeutic exercise;Balance training;Neuromuscular re-education;Patient/family education;Manual techniques;Dry needling;Aquatic Therapy;Electrical Stimulation;Iontophoresis 49m/ml Dexamethasone;Gait training    PT Next Visit Plan  hip and knee  strengthening, core strengthening, patellar mobility, manual techniques, modalities PRN    Consulted and Agree with Plan of Care  Patient       Patient will benefit from skilled therapeutic intervention in order to improve the following deficits and impairments:  Pain, Improper body mechanics, Postural dysfunction, Dizziness, Decreased strength, Difficulty walking  Visit Diagnosis: Muscle weakness (generalized)  Chronic pain of left knee  Radiculopathy, lumbar region  History of falling     Problem List Patient Active Problem List   Diagnosis Date Noted  . Obese 06/20/2016  . Status post right knee replacement 06/19/2016  . S/P left TKA 06/18/2016  . Spinal stenosis of lumbar region 09/14/2015   MJoneen BoersPT, DPT   12/03/2017, 8:27 PM  [facility] [address] [place] [id number]: [phone]  Fax:  [phone] Name: Destiny ROATMRN: [id number] of Birth: [birth year]

## 2017-12-04 ENCOUNTER — Encounter: Payer: Self-pay | Admitting: Speech Pathology
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
## 2017-12-05 ENCOUNTER — Ambulatory Visit: Payer: Medicare Other | Admitting: Speech Pathology

## 2017-12-05 ENCOUNTER — Ambulatory Visit: Payer: Medicare Other

## 2017-12-05 DIAGNOSIS — R41841 Cognitive communication deficit: Secondary | ICD-10-CM | POA: Diagnosis not present

## 2017-12-05 DIAGNOSIS — G8929 Other chronic pain: Secondary | ICD-10-CM

## 2017-12-05 DIAGNOSIS — M25562 Pain in left knee: Secondary | ICD-10-CM

## 2017-12-05 DIAGNOSIS — Z9181 History of falling: Secondary | ICD-10-CM

## 2017-12-05 DIAGNOSIS — M5416 Radiculopathy, lumbar region: Secondary | ICD-10-CM

## 2017-12-05 DIAGNOSIS — M6281 Muscle weakness (generalized): Secondary | ICD-10-CM

## 2017-12-05 NOTE — Therapy (Signed)
[facility] [address], [place], [address] Phone: [phone]   Fax:  [phone]  Physical Therapy Treatment  Patient Details  Name: Destiny Shaw MRN: [id number] Date of Birth: 07-14-1950 Referring Provider (PT): Paralee Cancel, MD   Encounter Date: 12/05/2017  PT End of Session - 12/05/17 1625    Visit Number  17    Number of Visits  29    Date for PT Re-Evaluation  01/02/18    Authorization Type  8    Authorization Time Period  of 10 progress note Medicare    PT Start Time  1625    PT Stop Time  1722    PT Time Calculation (min)  57 min    Equipment Utilized During Treatment  Gait belt    Activity Tolerance  Patient tolerated treatment well    Behavior During Therapy  WFL for tasks assessed/performed       Past Medical History:  Diagnosis Date  . ADD (attention deficit disorder)   . Anemia   . Anginal pain ([place])    pt states has occas chest pain relates to indigestion; pt uses rest to relieve   . Anxiety   . Arthritis   . Concussion   . Depression   . Diabetes mellitus without complication ([place])   . Dizziness   . Fall   . GERD (gastroesophageal reflux disease)   . Headache   . History of urinary tract infection   . Hyperlipidemia   . Hypertension   . Hypothyroidism   . IBS (irritable bowel syndrome)   . Imbalance   . Numbness    right leg   . Numbness in both hands    comes and goes   . Pneumonia    last episode approx 1 year ago   . Sleep apnea   . Wears glasses     Past Surgical History:  Procedure Laterality Date  . BREAST CYST ASPIRATION Left 1980's   neg  . BREAST CYST EXCISION Right 1980's   neg  . BREAST LUMPECTOMY Right   . BREAST SURGERY    . CARPAL TUNNEL RELEASE    . CESAREAN SECTION     times 2  . COLONOSCOPY WITH PROPOFOL N/A 05/11/2015   Procedure: COLONOSCOPY WITH PROPOFOL;  Surgeon: Manya Silvas, MD;  Location: [facility] ENDOSCOPY;  Service: Endoscopy;   Laterality: N/A;  . DE QUERVAIN'S RELEASE Right   . ESOPHAGOGASTRODUODENOSCOPY (EGD) WITH PROPOFOL N/A 05/11/2015   Procedure: ESOPHAGOGASTRODUODENOSCOPY (EGD) WITH PROPOFOL;  Surgeon: Manya Silvas, MD;  Location: [facility] ENDOSCOPY;  Service: Endoscopy;  Laterality: N/A;  . EYE SURGERY     laser surgery bilat   . HERNIA REPAIR    . KNEE ARTHROSCOPY    . LUMBAR LAMINECTOMY/DECOMPRESSION MICRODISCECTOMY Bilateral 09/14/2015   Procedure: MICRO LUMBAR BILATERAL DECOMPRESSION L4 - L5;  Surgeon: Susa Day, MD;  Location: [facility] ORS;  Service: Orthopedics;  Laterality: Bilateral;  . REDUCTION MAMMAPLASTY Bilateral 1980  . RHINOPLASTY    . TONSILLECTOMY    . TOTAL KNEE ARTHROPLASTY Left 06/18/2016   Procedure: LEFT TOTAL KNEE ARTHROPLASTY;  Surgeon: Paralee Cancel, MD;  Location: [facility] ORS;  Service: Orthopedics;  Laterality: Left;  Adductor Block  . TUBAL LIGATION    . UVULOPALATOPHARYNGOPLASTY      There were no vitals filed for this visit.  Subjective Assessment - 12/05/17 1627    Subjective  Back pain comes off and on during the day. Does  not take pain medicine. Does exercises which help. Has not been able to do her exercises before bed due to being tired but when she does she gets energized. Back pain seems to be worse at night.  3-4/10 back pain currently.  A lot of burning and stinging instead of pain pain. L knee is about a 3-4/10 burning and stinging. Has not been doing a lot of walking.  Hesitant walking due to feeling off balance. Feels like she is falling forward and stumbles.  Feels like she leans forward.  Does not remember any falls during the past 6 months.  The room sometimes feels like it is tilted.  Pt states taht she feels more tired than usual. Also notices that she feels like she is not herself since she fell and hit her head.    Pertinent History  Low back pain. Pt states having back surgery about 2-3 years ago. After she had her L TKA, her back started aggravating her due to her  walking. Pt also fell about 4 weeks ago onto her L knee. Dr. Alvan Dame checked out her L knee. L knee was fine but has some inflammation.  Pt tries to keep it iced. Pt still recovering for her L TKA but the fall set her back.   Her L knee surgery was last year.  L knee still bothers her a lot.  Had surgery for her back before her L knee surgery. Was doing well until her L knee surgery. The fall made it worse. Feels debilitating. Also has a hard time getting into and out of the car mainly due to her L knee.   Pt fell due to her L knee bucking on her. Pt was trying to pick something up from the side of the couch.  Also has a hard time getting up from the floor.  Pt states having bladder accidents since after her fall (pt was recommended to tell her MD about it).  Bowel problems since taking medications (was constapated, but after taking medications, pt had diarrhea which was difficult to control. Getting out of the metformin fixed the bowel issue).  Pt states tingling and numbness L lateral LE along the L5 dermatome.  Denies saddle anesthesia.  Pt states that her back problem is mainly on her L side.  Pt landed on her L knee when she fell.  No other falls within the last 6 months. Pt also states being very dizzy since her L knee surgery.  Had PT treatement for her dizziness before which did not help.  The room feels like it is spinning when she gets dizzy.  I feel like it is getting better then she does something.     Patient Stated Goals  Be better able to get into and out of her vehicle (midsized truck), into and out of chairs, be better able to roll in her bed with less L knee pain. Be able to get down on the floor and get back up.     Currently in Pain?  Yes    Pain Score  4    3-4/10   Pain Onset  More than a month ago                               PT Education - 12/05/17 1639    Education Details  ther-ex, vitals    Person(s) Educated  Patient    Methods   Explanation;Demonstration;Tactile cues;Handout;Verbal cues  Comprehension  Returned demonstration;Verbalized understanding         Objectives   Frazier Richards, MD is her primary care provider. Pt states that she would like for her treatment note to be routed to Dr. Frazier Richards.   Latex allergies: itching   MedbridgeAccess Code: [email or web address]   Therapeutic exercise  Gait around the gym. 100 ft. Increased lateral lean L > R. Some decreased trunk control observed. Pt also states "feeling whoozy"  Blood pressure L arm sitting, mechanically taken: 160/65, HR 65   Blood pressure L arm standing (after 2 minutes), mechanically taken: 159/80, HR 40  Side stepping with bilateral UE assist, resisting red latex free band at thighs 5 ft to the R and 5 ft to the L 5x to promote glute med muscle strengthening   2 sets of 5 laps. Cues to decrease lateral lean compensation  Blood pressure L arm standing, mechanically taken 109/104, HR  74 Manual measurement: 176/78 (L arm standing, manually taken; multiple beats were skipped while listening to stethescope) 68 BPM but 2 beats were skipped (palpated) when manually measuring heart rate  5:05 pm Pt states being more tired than usual. Also states having headache at her L, then bilateral temple area which eases off with rest.    Pt states turning her head to the L causes dizziness and focus problems   Further exercises not performed secondary to vitals  VBI testing not performed secondary to elevated blood pressure levels.   Pt was recommended to get her vitals and blood vessels in neck checked by her doctor. Pt states that she is going to call her doctor in the morning.   Decreased headache at end of session. Pt was recommended to take it easy the rest of the day. If her symptoms get worse, pt was recommended to get it checked out such as at urgent care or ER for safety. Pt verbalized understanding.    Improved exercise technique,  movement at target joints, use of target muscles after mod verbal, visual, tactile cues.    Initiated session with pt walking around the gym 1x for 100 ft. Pt demonstrates decreased trunk control and and increased lateral lean compensation. Pt also states feeling "whoozy" afterwards. Blood pressure and HR monitored throughout session. Worked on glute med strengthening in standing to address lateral lean compensation during stance phase of gait. Vitals obtained mechanically which revealed decreased systolic and increased diastolic levels after exercise.  Manual measurement of blood pressure was 176/78, HR 68 beats per minute. When listening to the heart beat through the stethoscope while measuring blood pressure, multiple skipped beats where noticed. 2 beats skipped notice/palpated when measuring heart rate through L radial pulse. Pt was recommended to contact her MD to get her blood pressure and heart rate checked secondary to findings as well as to get the blood vessels in her neck checked out as well secondary to pt stating that turning her head to the left causes dizziness and vision focus problems. Pt verbalized understanding. Vertebral artery testing not performed secondary to elevated blood pressure levels.       PT Short Term Goals - 11/21/17 1223      PT SHORT TERM GOAL #1   Title  Patient will be independent with her HEP to help decrease back and L knee pain and improve ability to perform functional tasks.     Time  3    Period  Weeks    Status  On-going    Target Date  12/12/17        PT Long Term Goals - 11/21/17 1137      PT LONG TERM GOAL #1   Title  Patient will have a decrease in back pain to 3/10 or less at worst to promote ability to ambulate, turn in bed, perform standing tasks with less pain.     Baseline  6/10 back pain at most (09/24/2017); 7-8/10 at most for the past 7 days, duration of pain is better since starting PT (10/29/2017); 5-6/10 at most for the past 7 days  (11/18/2017)    Time  6    Period  Weeks    Status  On-going    Target Date  01/02/18      PT LONG TERM GOAL #2   Title  Patient will have a decrease in L knee pain to 3/10 or less at worst to promote ability to ambulate, negotiate stairs, get into and out of a car more comfortably.     Baseline  6/10 L knee pain at most for the past 3 months (09/24/2017); 7-8/10 at most for the past 7 days, duration of pain is better since starting PT (10/29/2017); 7/10 at most for the past 7 days (11/18/2017)    Time  6    Period  Weeks    Status  On-going    Target Date  01/02/18      PT LONG TERM GOAL #3   Title  Patient will improve TUG time to 12 seconds or less as a demonstration of improved functional mobility and balance.     Baseline  TUG no AD: 16.05 seconds on average (09/24/2017); 14.3 seconds on average (10/29/2017); 13 seconds average (11/21/2017)    Time  6    Period  Weeks    Status  Partially Met    Target Date  01/02/18      PT LONG TERM GOAL #4   Title  Patient will improve her back FOTO score by at least 10 points as a demonstration of improved funtion.     Baseline  Back FOTO: 33 (09/24/2017); 38 (10/29/2017); 36 (11/21/2017)    Time  6    Period  Weeks    Status  On-going    Target Date  01/02/18      PT LONG TERM GOAL #5   Title  Patient will improve bilateral LE strength by at least 1/2 MMT grade to promote ability to perform standing tasks.     Time  6    Period  Weeks    Status  Partially Met    Target Date  01/02/18      PT LONG TERM GOAL #6   Title  Patient will improve her Modified Oswestry Low Back pain disablity questionnaire by at least 10% as a demonstration of improved function.     Baseline  48% (09/24/2017); 46% (10/29/2017), (11/21/2017)    Time  6    Period  Weeks    Status  On-going    Target Date  01/02/18            Plan - 12/05/17 1625    Clinical Impression Statement  Initiated session with pt walking around the gym 1x for 100 ft. Pt demonstrates  decreased trunk control and and increased lateral lean compensation. Pt also states feeling "whoozy" afterwards. Blood pressure and HR monitored throughout session. Worked on glute med strengthening in standing to address lateral lean compensation during stance phase of gait. Vitals obtained mechanically which revealed  decreased systolic and increased diastolic levels after exercise.  Manual measurement of blood pressure was 176/78, HR 68 beats per minute. When listening to the heart beat through the stethoscope while measuring blood pressure, multiple skipped beats where noticed. 2 beats skipped notice/palpated when measuring heart rate through L radial pulse. Pt was recommended to contact her MD to get her blood pressure and heart rate checked secondary to findings as well as to get the blood vessels in her neck checked out as well secondary to pt stating that turning her head to the left causes dizziness and vision focus problems. Pt verbalized understanding. Vertebral artery testing not performed secondary to elevated blood pressure levels.     Rehab Potential  Fair    Clinical Impairments Affecting Rehab Potential  (-) chronicity of condition, multiple areas of pain, medical history, age; (+) motivated, husband support    PT Frequency  2x / week    PT Duration  6 weeks    PT Treatment/Interventions  Therapeutic activities;Therapeutic exercise;Balance training;Neuromuscular re-education;Patient/family education;Manual techniques;Dry needling;Aquatic Therapy;Electrical Stimulation;Iontophoresis 50m/ml Dexamethasone;Gait training    PT Next Visit Plan  hip and knee strengthening, core strengthening, patellar mobility, manual techniques, modalities PRN    Consulted and Agree with Plan of Care  Patient       Patient will benefit from skilled therapeutic intervention in order to improve the following deficits and impairments:  Pain, Improper body mechanics, Postural dysfunction, Dizziness, Decreased  strength, Difficulty walking  Visit Diagnosis: Muscle weakness (generalized)  Chronic pain of left knee  Radiculopathy, lumbar region  History of falling     Problem List Patient Active Problem List   Diagnosis Date Noted  . Obese 06/20/2016  . Status post right knee replacement 06/19/2016  . S/P left TKA 06/18/2016  . Spinal stenosis of lumbar region 09/14/2015    MJoneen BoersPT, DPT   12/05/2017, 5:50 PM  [facility] [address] [place] [address]Phone: [phone]  Fax:  [phone] Name: Destiny BLEAUMRN: [id number] of Birth: 405/14/1952

## 2017-12-06 ENCOUNTER — Encounter: Payer: Self-pay | Admitting: Speech Pathology
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
## 2017-12-10 ENCOUNTER — Ambulatory Visit: Payer: Medicare Other

## 2017-12-10 ENCOUNTER — Telehealth: Payer: Self-pay

## 2017-12-10 ENCOUNTER — Ambulatory Visit: Payer: Medicare Other | Admitting: Speech Pathology

## 2017-12-10 NOTE — Telephone Encounter (Signed)
No show. Called patient and left a message pertaining to appointment and a reminder for the next follow up session. Return phone call requested. Phone number ([phone]) provided.   

## 2017-12-12 ENCOUNTER — Ambulatory Visit: Payer: Medicare Other | Admitting: Speech Pathology

## 2017-12-12 ENCOUNTER — Ambulatory Visit: Payer: Medicare Other

## 2017-12-12 DIAGNOSIS — R42 Dizziness and giddiness: Secondary | ICD-10-CM

## 2017-12-12 DIAGNOSIS — G8929 Other chronic pain: Secondary | ICD-10-CM

## 2017-12-12 DIAGNOSIS — R41841 Cognitive communication deficit: Secondary | ICD-10-CM | POA: Diagnosis not present

## 2017-12-12 DIAGNOSIS — M6281 Muscle weakness (generalized): Secondary | ICD-10-CM

## 2017-12-12 DIAGNOSIS — Z9181 History of falling: Secondary | ICD-10-CM

## 2017-12-12 DIAGNOSIS — M25562 Pain in left knee: Secondary | ICD-10-CM

## 2017-12-12 DIAGNOSIS — M5416 Radiculopathy, lumbar region: Secondary | ICD-10-CM

## 2017-12-12 NOTE — Therapy (Signed)
[facility] PHYSICAL AND SPORTS MEDICINE [address] [place], [place], [address] Phone: [phone]   Fax:  [phone]  Physical Therapy Treatment And Progress Report (11/05/2017 - 12/12/2017)    Patient Details  Name: Destiny Shaw MRN: [id number] Date of Birth: 07/11/1950 Referring Provider (PT): Matthew Olin, MD   Encounter Date: 12/12/2017  PT End of Session - 12/12/17 0859    Visit Number  18    Number of Visits  29    Date for PT Re-Evaluation  01/02/18    Authorization Type  9    Authorization Time Period  of 10 progress note Medicare    PT Start Time  0859    PT Stop Time  0944    PT Time Calculation (min)  45 min    Equipment Utilized During Treatment  Gait belt    Activity Tolerance  Patient tolerated treatment well    Behavior During Therapy  WFL for tasks assessed/performed       Past Medical History:  Diagnosis Date  . ADD (attention deficit disorder)   . Anemia   . Anginal pain (HCC)    pt states has occas chest pain relates to indigestion; pt uses rest to relieve   . Anxiety   . Arthritis   . Concussion   . Depression   . Diabetes mellitus without complication (HCC)   . Dizziness   . Fall   . GERD (gastroesophageal reflux disease)   . Headache   . History of urinary tract infection   . Hyperlipidemia   . Hypertension   . Hypothyroidism   . IBS (irritable bowel syndrome)   . Imbalance   . Numbness    right leg   . Numbness in both hands    comes and goes   . Pneumonia    last episode approx 1 year ago   . Sleep apnea   . Wears glasses     Past Surgical History:  Procedure Laterality Date  . BREAST CYST ASPIRATION Left 1980's   neg  . BREAST CYST EXCISION Right 1980's   neg  . BREAST LUMPECTOMY Right   . BREAST SURGERY    . CARPAL TUNNEL RELEASE    . CESAREAN SECTION     times 2  . COLONOSCOPY WITH PROPOFOL N/A 05/11/2015   Procedure: COLONOSCOPY WITH PROPOFOL;  Surgeon: Robert T Elliott, MD;   Location: [facility] ENDOSCOPY;  Service: Endoscopy;  Laterality: N/A;  . DE QUERVAIN'S RELEASE Right   . ESOPHAGOGASTRODUODENOSCOPY (EGD) WITH PROPOFOL N/A 05/11/2015   Procedure: ESOPHAGOGASTRODUODENOSCOPY (EGD) WITH PROPOFOL;  Surgeon: Robert T Elliott, MD;  Location: [facility] ENDOSCOPY;  Service: Endoscopy;  Laterality: N/A;  . EYE SURGERY     laser surgery bilat   . HERNIA REPAIR    . KNEE ARTHROSCOPY    . LUMBAR LAMINECTOMY/DECOMPRESSION MICRODISCECTOMY Bilateral 09/14/2015   Procedure: MICRO LUMBAR BILATERAL DECOMPRESSION L4 - L5;  Surgeon: Jeffrey Beane, MD;  Location: [facility];  Service: Orthopedics;  Laterality: Bilateral;  . REDUCTION MAMMAPLASTY Bilateral 1980  . RHINOPLASTY    . TONSILLECTOMY    . TOTAL KNEE ARTHROPLASTY Left 06/18/2016   Procedure: LEFT TOTAL KNEE ARTHROPLASTY;  Surgeon: Matthew Olin, MD;  Location: [facility];  Service: Orthopedics;  Laterality: Left;  Adductor Block  . TUBAL LIGATION    . UVULOPALATOPHARYNGOPLASTY      There were no vitals filed for this visit.  Subjective Assessment - 12/12/17 0900    Subjective  Pt states   having vertigo and UTI. Currently on antibiotics (keflex) and meclizine for vertigo. The vertigo got so bad and thought she was having a stroke. L eye felt like it was swelling and drooping on Saturday. Felt like she was falling backwards. Pt also difficulty walking due to being physically weak and off balance Saturday. Saw a PA yesterday who prescribed her the meclizine and antibiotics.   Never heard back from Dr. Anderson.  Currently with her husband Curtis. Pretty sure she told her PA about the eye droop and difficulty walking.  Does not think the PA said anything.  Had and EKG and they saw the skipped beats.  Her husband Curtis has been walking with her everywhere.  Does not know if the meclizine is helping.  Today her dizziness is better.  Back has been huring a lot (4/10 currently; 8/10 at worst for the past 7 days but does not last a long time, maybe 10  minutes at most. Doing the exercises helps).  The period of time the back pain lasts is better compared to prior to starting PT. Used to last about 30 minutes.  3/10 L knee currently (6/10 L knee pain at most for the past 7 days).    Pt states feeling better today. Room also feels like it is slanted at times.     Pertinent History  Low back pain. Pt states having back surgery about 2-3 years ago. After she had her L TKA, her back started aggravating her due to her walking. Pt also fell about 4 weeks ago onto her L knee. Dr. Olin checked out her L knee. L knee was fine but has some inflammation.  Pt tries to keep it iced. Pt still recovering for her L TKA but the fall set her back.   Her L knee surgery was last year.  L knee still bothers her a lot.  Had surgery for her back before her L knee surgery. Was doing well until her L knee surgery. The fall made it worse. Feels debilitating. Also has a hard time getting into and out of the car mainly due to her L knee.   Pt fell due to her L knee bucking on her. Pt was trying to pick something up from the side of the couch.  Also has a hard time getting up from the floor.  Pt states having bladder accidents since after her fall (pt was recommended to tell her MD about it).  Bowel problems since taking medications (was constapated, but after taking medications, pt had diarrhea which was difficult to control. Getting out of the metformin fixed the bowel issue).  Pt states tingling and numbness L lateral LE along the L5 dermatome.  Denies saddle anesthesia.  Pt states that her back problem is mainly on her L side.  Pt landed on her L knee when she fell.  No other falls within the last 6 months. Pt also states being very dizzy since her L knee surgery.  Had PT treatement for her dizziness before which did not help.  The room feels like it is spinning when she gets dizzy.  I feel like it is getting better then she does something.     Patient Stated Goals  Be better able to get  into and out of her vehicle (midsized truck), into and out of chairs, be better able to roll in her bed with less L knee pain. Be able to get down on the floor and get back up.       Currently in Pain?  Yes    Pain Score  4     Pain Onset  More than a month ago         [facility] PT Assessment - 12/12/17 0001      Strength   Right Hip Flexion  4/5    Right Hip Extension  4+/5   seated manually resisted hip extension   Right Hip ABduction  5/5   seated manually resisted clamshell isometrics   Left Hip Flexion  4/5    Left Hip Extension  4/5   seated manually resisted hip extension   Left Hip ABduction  5/5   seated manually resisted clamshell isometrics   Right Knee Flexion  5/5    Right Knee Extension  5/5    Left Knee Flexion  5/5    Left Knee Extension  5/5                           PT Education - 12/12/17 1308    Education Details  ther-ex    Person(s) Educated  Patient    Methods  Explanation;Demonstration;Verbal cues;Tactile cues    Comprehension  Returned demonstration;Verbalized understanding       Objectives  Latex band allergies  Ther-ex  Increased time taken to listen to patient subjective.   Blood pressure, L arm sitting, manually taken: Diffculty hearing the systolic part for measurement Then mechanically taken, normal cuff, L arm sitting: 143/72, HR 63  Modified Vertebral artery testing (seated)  R side: increased dizziness  L side: vision not as clear   Pt provided verbal permission to share info to another therapist to help with her dizziness.   Latex free bands used Standing B shoulder extension resisting red band 10x3 with 5 second holds to promote trunk muscle activation   Seated manually resisted hip flexion, hip extension, clamshell isometrics, knee flexion, knee extension 1-2x each way for each LE. Reviewed progress/current status with LE strength with pt.    Standing up from a chair, walking 10 ft forward, then returning  10 ft, then sitting back onto chair 3x  17 seconds, 16 seconds, 17 seconds (16.67 seconds average). Slower times today compared to previous measurement in which recent exacerbation of dizziness might play a factor.   Improved exercise technique, movement at target joints, use of target muscles after min to mod verbal, visual, tactile cues.    Pt demonstrates overall improved B LE strength compared to previous measurement, overall decreased duration of worst pain levels for back, and overall improved FOTO score suggesting improved function based on her low back. Pt also experienced a recent exacerbation of dizziness which may play a factor in slower TUG times today. Pt still demonstrates back and L knee pain, hip weakness, some unsteadiness, and difficulty performing functional tasks and would benefit from continued skilled physical therapy services to address the aforementioned deficits.    PT Short Term Goals - 12/12/17 0930      PT SHORT TERM GOAL #1   Title  Patient will be independent with her HEP to help decrease back and L knee pain and improve ability to perform functional tasks.     Time  3    Period  Weeks    Status  Achieved    Target Date  12/12/17        PT Long Term Goals - 12/12/17 1308      PT LONG TERM GOAL #1   Title    Patient will have a decrease in back pain to 3/10 or less at worst to promote ability to ambulate, turn in bed, perform standing tasks with less pain.     Baseline  6/10 back pain at most (09/24/2017); 7-8/10 at most for the past 7 days, duration of pain is better since starting PT (10/29/2017); 5-6/10 at most for the past 7 days (11/18/2017); 8/10 at most for the past 7 days but the duration of pain is less compared to prior to starting PT (12/12/2017)    Time  6    Period  Weeks    Status  On-going    Target Date  01/02/18      PT LONG TERM GOAL #2   Title  Patient will have a decrease in L knee pain to 3/10 or less at worst to promote ability to  ambulate, negotiate stairs, get into and out of a car more comfortably.     Baseline  6/10 L knee pain at most for the past 3 months (09/24/2017); 7-8/10 at most for the past 7 days, duration of pain is better since starting PT (10/29/2017); 7/10 at most for the past 7 days (11/18/2017); 6/10 L knee pain at most for the past 7 days (12/12/2017)    Time  6    Period  Weeks    Status  On-going    Target Date  01/02/18      PT LONG TERM GOAL #3   Title  Patient will improve TUG time to 12 seconds or less as a demonstration of improved functional mobility and balance.     Baseline  TUG no AD: 16.05 seconds on average (09/24/2017); 14.3 seconds on average (10/29/2017); 13 seconds average (11/21/2017); 16.67 seconds average (12/12/2017)    Time  6    Period  Weeks    Status  Partially Met    Target Date  01/02/18      PT LONG TERM GOAL #4   Title  Patient will improve her back FOTO score by at least 10 points as a demonstration of improved funtion.      Baseline  Back FOTO: 33 (09/24/2017); 38 (10/29/2017); 36 (11/21/2017); 40 (12/12/2017)    Time  6    Period  Weeks    Status  On-going    Target Date  01/02/18      PT LONG TERM GOAL #5   Title  Patient will improve bilateral LE strength by at least 1/2 MMT grade to promote ability to perform standing tasks.     Time  6    Period  Weeks    Status  Partially Met      PT LONG TERM GOAL #6   Title  Patient will improve her Modified Oswestry Low Back pain disablity questionnaire by at least 10% as a demonstration of improved function.     Baseline  48% (09/24/2017); 46% (10/29/2017), (11/21/2017); 56% (12/12/2017)    Time  6    Period  Weeks    Status  On-going    Target Date  01/02/18            Plan - 12/12/17 0918    Clinical Impression Statement  Pt demonstrates overall improved B LE strength compared to previous measurement, overall decreased duration of worst pain levels for back, and overall improved FOTO score suggesting improved function  based on her low back. Pt also experienced a recent exacerbation of dizziness which may play a factor in slower TUG times today.   Pt still demonstrates back and L knee pain, hip weakness, some unsteadiness, and difficulty performing functional tasks and would benefit from continued skilled physical therapy services to address the aforementioned deficits.     History and Personal Factors relevant to plan of care:  Chronic low back pain, L LE radiating symptoms, L knee pain, hx of fall, medical history, LE weakness, difficulty getting into and out of the car, stair negotiatin, bed mobility,  and walking due to back and L knee pain.      Clinical Presentation  Evolving    Clinical Presentation due to:  excacerbation of dizziness affecting balance    Clinical Decision Making  Moderate    Rehab Potential  Fair    Clinical Impairments Affecting Rehab Potential  (-) chronicity of condition, multiple areas of pain, medical history, age; (+) motivated, husband support    PT Frequency  2x / week    PT Duration  6 weeks    PT Treatment/Interventions  Therapeutic activities;Therapeutic exercise;Balance training;Neuromuscular re-education;Patient/family education;Manual techniques;Dry needling;Aquatic Therapy;Electrical Stimulation;Iontophoresis 4mg/ml Dexamethasone;Gait training    PT Next Visit Plan  hip and knee strengthening, core strengthening, patellar mobility, manual techniques, modalities PRN    Consulted and Agree with Plan of Care  Patient       Patient will benefit from skilled therapeutic intervention in order to improve the following deficits and impairments:  Pain, Improper body mechanics, Postural dysfunction, Dizziness, Decreased strength, Difficulty walking  Visit Diagnosis: Radiculopathy, lumbar region  Muscle weakness (generalized)  Chronic pain of left knee  History of falling  Dizziness and giddiness     Problem List Patient Active Problem List   Diagnosis Date Noted  .  Obese 06/20/2016  . Status post right knee replacement 06/19/2016  . S/P left TKA 06/18/2016  . Spinal stenosis of lumbar region 09/14/2015    Thank you for your referral.  Miguel Laygo PT, DPT   12/12/2017, 8:05 PM  [facility] PHYSICAL AND SPORTS MEDICINE [address] [place], [place], [address] Phone: [phone]   Fax:  [phone]  Name: Destiny Shaw MRN: [id number] Date of Birth: 10/20/1950   

## 2017-12-13 ENCOUNTER — Encounter: Payer: Self-pay | Admitting: Speech Pathology
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
## 2017-12-16 ENCOUNTER — Ambulatory Visit: Payer: Medicare Other | Admitting: Speech Pathology

## 2017-12-16 ENCOUNTER — Telehealth: Payer: Self-pay

## 2017-12-16 DIAGNOSIS — R41841 Cognitive communication deficit: Secondary | ICD-10-CM | POA: Diagnosis not present

## 2017-12-16 NOTE — Telephone Encounter (Signed)
Dr. Ouida Sills returned phone call. Informed him of the vertebral artery results as well as pt reports of L eye droop and difficulty walking last week as well as increased dizziness. MD said that he will try to have one of his nurses call pt. He might do a doppler.

## 2017-12-16 NOTE — Telephone Encounter (Signed)
Called Dr. Ruthann Cancer Anderson's office (pt PCP). MD unavailable. Requested a return phone call at his earliest convenience. Phone number [phone]) provided.

## 2017-12-16 NOTE — Telephone Encounter (Signed)
Called Dr. Tonette Bihari office again. Receptionist answered who said that he is out of the building. Return phone call requested. Phone number provided [phone])

## 2017-12-17 ENCOUNTER — Ambulatory Visit: Payer: Medicare Other

## 2017-12-17 ENCOUNTER — Encounter: Payer: Self-pay | Admitting: Speech Pathology

## 2017-12-17 ENCOUNTER — Encounter: Payer: Medicare Other | Admitting: Speech Pathology

## 2017-12-17 DIAGNOSIS — M25562 Pain in left knee: Secondary | ICD-10-CM

## 2017-12-17 DIAGNOSIS — G8929 Other chronic pain: Secondary | ICD-10-CM

## 2017-12-17 DIAGNOSIS — R41841 Cognitive communication deficit: Secondary | ICD-10-CM | POA: Diagnosis not present

## 2017-12-17 DIAGNOSIS — M5416 Radiculopathy, lumbar region: Secondary | ICD-10-CM

## 2017-12-17 DIAGNOSIS — M6281 Muscle weakness (generalized): Secondary | ICD-10-CM

## 2017-12-17 NOTE — Therapy (Signed)
[facility] [address] [place], [address] Phone: [phone]   Fax:  [phone]  Physical Therapy Treatment  Patient Details  Name: Destiny Shaw MRN: [id number] Date of Birth: Nov 05, 1950 Referring Provider (PT): Paralee Cancel, MD   Encounter Date: 12/17/2017  PT End of Session - 12/17/17 1040    Visit Number  19    Number of Visits  29    Date for PT Re-Evaluation  01/02/18    Authorization Type  1    Authorization Time Period  of 10 progress note Medicare    PT Start Time  1040    PT Stop Time  1132    PT Time Calculation (min)  52 min    Equipment Utilized During Treatment  Gait belt    Activity Tolerance  Patient tolerated treatment well    Behavior During Therapy  WFL for tasks assessed/performed       Past Medical History:  Diagnosis Date  . ADD (attention deficit disorder)   . Anemia   . Anginal pain ([place])    pt states has occas chest pain relates to indigestion; pt uses rest to relieve   . Anxiety   . Arthritis   . Concussion   . Depression   . Diabetes mellitus without complication ([place])   . Dizziness   . Fall   . GERD (gastroesophageal reflux disease)   . Headache   . History of urinary tract infection   . Hyperlipidemia   . Hypertension   . Hypothyroidism   . IBS (irritable bowel syndrome)   . Imbalance   . Numbness    right leg   . Numbness in both hands    comes and goes   . Pneumonia    last episode approx 1 year ago   . Sleep apnea   . Wears glasses     Past Surgical History:  Procedure Laterality Date  . BREAST CYST ASPIRATION Left 1980's   neg  . BREAST CYST EXCISION Right 1980's   neg  . BREAST LUMPECTOMY Right   . BREAST SURGERY    . CARPAL TUNNEL RELEASE    . CESAREAN SECTION     times 2  . COLONOSCOPY WITH PROPOFOL N/A 05/11/2015   Procedure: COLONOSCOPY WITH PROPOFOL;  Surgeon: Manya Silvas, MD;  Location: [facility] ENDOSCOPY;  Service: Endoscopy;   Laterality: N/A;  . DE QUERVAIN'S RELEASE Right   . ESOPHAGOGASTRODUODENOSCOPY (EGD) WITH PROPOFOL N/A 05/11/2015   Procedure: ESOPHAGOGASTRODUODENOSCOPY (EGD) WITH PROPOFOL;  Surgeon: Manya Silvas, MD;  Location: [facility] ENDOSCOPY;  Service: Endoscopy;  Laterality: N/A;  . EYE SURGERY     laser surgery bilat   . HERNIA REPAIR    . KNEE ARTHROSCOPY    . LUMBAR LAMINECTOMY/DECOMPRESSION MICRODISCECTOMY Bilateral 09/14/2015   Procedure: MICRO LUMBAR BILATERAL DECOMPRESSION L4 - L5;  Surgeon: Susa Day, MD;  Location: [facility] ORS;  Service: Orthopedics;  Laterality: Bilateral;  . REDUCTION MAMMAPLASTY Bilateral 1980  . RHINOPLASTY    . TONSILLECTOMY    . TOTAL KNEE ARTHROPLASTY Left 06/18/2016   Procedure: LEFT TOTAL KNEE ARTHROPLASTY;  Surgeon: Paralee Cancel, MD;  Location: [facility] ORS;  Service: Orthopedics;  Laterality: Left;  Adductor Block  . TUBAL LIGATION    . UVULOPALATOPHARYNGOPLASTY      There were no vitals filed for this visit.  Subjective Assessment - 12/17/17 1042    Subjective  Pt states that her back hurts a lot. L knee  hurt a typical hurt, 3-4/10. The weather might be bothering her back (6/10 currently). Dizziness is not as bad this morning. Was terrible yesterday. Dizziness increases the more activities she does. Tired of being dizzy and balance being off. The medication for the vertigo is not helping.  Has not had a phone call from Dr. Tonette Bihari office yet.     Pertinent History  Low back pain. Pt states having back surgery about 2-3 years ago. After she had her L TKA, her back started aggravating her due to her walking. Pt also fell about 4 weeks ago onto her L knee. Dr. Alvan Dame checked out her L knee. L knee was fine but has some inflammation.  Pt tries to keep it iced. Pt still recovering for her L TKA but the fall set her back.   Her L knee surgery was last year.  L knee still bothers her a lot.  Had surgery for her back before her L knee surgery. Was doing well until her L knee  surgery. The fall made it worse. Feels debilitating. Also has a hard time getting into and out of the car mainly due to her L knee.   Pt fell due to her L knee bucking on her. Pt was trying to pick something up from the side of the couch.  Also has a hard time getting up from the floor.  Pt states having bladder accidents since after her fall (pt was recommended to tell her MD about it).  Bowel problems since taking medications (was constapated, but after taking medications, pt had diarrhea which was difficult to control. Getting out of the metformin fixed the bowel issue).  Pt states tingling and numbness L lateral LE along the L5 dermatome.  Denies saddle anesthesia.  Pt states that her back problem is mainly on her L side.  Pt landed on her L knee when she fell.  No other falls within the last 6 months. Pt also states being very dizzy since her L knee surgery.  Had PT treatement for her dizziness before which did not help.  The room feels like it is spinning when she gets dizzy.  I feel like it is getting better then she does something.     Patient Stated Goals  Be better able to get into and out of her vehicle (midsized truck), into and out of chairs, be better able to roll in her bed with less L knee pain. Be able to get down on the floor and get back up.     Currently in Pain?  Yes    Pain Score  6     Pain Onset  More than a month ago                               PT Education - 12/17/17 1135    Education Details  ther-ex, use of MH for low back to decrease muscle tension and pain    Person(s) Educated  Patient    Methods  Explanation;Demonstration;Tactile cues;Verbal cues;Handout    Comprehension  Returned demonstration;Verbalized understanding      Objectives  12/24/2017 is her appointment with Dr. Frazier Richards   Ther-ex  Informed pt of phone conversation with Dr. Frazier Richards yesterday  Blood pressure L arm sitting, mechanically taken, normal cuff:  133/72, HR 70  Side stepping with B UE assist 5 ft to the R and 5 ft to the L 5x to  promote glute med muscle use  Standing hip abduction 10x3 each LE with B UE assist   Pt states tight muscles feel like they are loosening up after exercise  Standing glute max squeeze 10x5 seconds   Car transfer to her sister's pick up truck per pt request.   Cues for sitting onto front passenger car seat, hand placement, and turning to bring her LE into and out of the vehicle. Pt states the technique helps.   Improved exercise technique, movement at target joints, use of target muscles after min to mod verbal, visual, tactile cues.    Manual therapy   Seated STM bilateral lumbar paraspinals, L > R to decrease tension   Decreased back pain to 1/10   Seated STM L vastus lateralis to decrease tension    Seated medial glide L patella to decrease stiffness     No L knee pain after manual therapy     Decreased back pain with treatment to decrease lumbar paraspinal muscle tension L > R as well as activation of both glute med and max muscles. Decreased L knee pain following treatment to decrease L vastus lateralis muscle tension and improving medial glide of L patella. Decreased back pain to 1/10 and no L knee pain after session. Pt will benefit from continued skilled physical therapy services to decrease pain, improve strength, balance, and function.    PT Short Term Goals - 12/12/17 0930      PT SHORT TERM GOAL #1   Title  Patient will be independent with her HEP to help decrease back and L knee pain and improve ability to perform functional tasks.     Time  3    Period  Weeks    Status  Achieved    Target Date  12/12/17        PT Long Term Goals - 12/12/17 1308      PT LONG TERM GOAL #1   Title  Patient will have a decrease in back pain to 3/10 or less at worst to promote ability to ambulate, turn in bed, perform standing tasks with less pain.     Baseline  6/10 back pain at most  (09/24/2017); 7-8/10 at most for the past 7 days, duration of pain is better since starting PT (10/29/2017); 5-6/10 at most for the past 7 days (11/18/2017); 8/10 at most for the past 7 days but the duration of pain is less compared to prior to starting PT (12/12/2017)    Time  6    Period  Weeks    Status  On-going    Target Date  01/02/18      PT LONG TERM GOAL #2   Title  Patient will have a decrease in L knee pain to 3/10 or less at worst to promote ability to ambulate, negotiate stairs, get into and out of a car more comfortably.     Baseline  6/10 L knee pain at most for the past 3 months (09/24/2017); 7-8/10 at most for the past 7 days, duration of pain is better since starting PT (10/29/2017); 7/10 at most for the past 7 days (11/18/2017); 6/10 L knee pain at most for the past 7 days (12/12/2017)    Time  6    Period  Weeks    Status  On-going    Target Date  01/02/18      PT LONG TERM GOAL #3   Title  Patient will improve TUG time to 12 seconds or less as  a demonstration of improved functional mobility and balance.     Baseline  TUG no AD: 16.05 seconds on average (09/24/2017); 14.3 seconds on average (10/29/2017); 13 seconds average (11/21/2017); 16.67 seconds average (12/12/2017)    Time  6    Period  Weeks    Status  Partially Met    Target Date  01/02/18      PT LONG TERM GOAL #4   Title  Patient will improve her back FOTO score by at least 10 points as a demonstration of improved funtion.      Baseline  Back FOTO: 33 (09/24/2017); 38 (10/29/2017); 36 (11/21/2017); 40 (12/12/2017)    Time  6    Period  Weeks    Status  On-going    Target Date  01/02/18      PT LONG TERM GOAL #5   Title  Patient will improve bilateral LE strength by at least 1/2 MMT grade to promote ability to perform standing tasks.     Time  6    Period  Weeks    Status  Partially Met      PT LONG TERM GOAL #6   Title  Patient will improve her Modified Oswestry Low Back pain disablity questionnaire by at least 10%  as a demonstration of improved function.     Baseline  48% (09/24/2017); 46% (10/29/2017), (11/21/2017); 56% (12/12/2017)    Time  6    Period  Weeks    Status  On-going    Target Date  01/02/18            Plan - 12/17/17 1050    Clinical Impression Statement  Decreased back pain with treatment to decrease lumbar paraspinal muscle tension L > R as well as activation of both glute med and max muscles. Decreased L knee pain following treatment to decrease L vastus lateralis muscle tension and improving medial glide of L patella. Decreased back pain to 1/10 and no L knee pain after session. Pt will benefit from continued skilled physical therapy services to decrease pain, improve strength, balance, and function.     Rehab Potential  Fair    Clinical Impairments Affecting Rehab Potential  (-) chronicity of condition, multiple areas of pain, medical history, age; (+) motivated, husband support    PT Frequency  2x / week    PT Duration  6 weeks    PT Treatment/Interventions  Therapeutic activities;Therapeutic exercise;Balance training;Neuromuscular re-education;Patient/family education;Manual techniques;Dry needling;Aquatic Therapy;Electrical Stimulation;Iontophoresis 3m/ml Dexamethasone;Gait training    PT Next Visit Plan  hip and knee strengthening, core strengthening, patellar mobility, manual techniques, modalities PRN    Consulted and Agree with Plan of Care  Patient       Patient will benefit from skilled therapeutic intervention in order to improve the following deficits and impairments:  Pain, Improper body mechanics, Postural dysfunction, Dizziness, Decreased strength, Difficulty walking  Visit Diagnosis: Muscle weakness (generalized)  Chronic pain of left knee  Radiculopathy, lumbar region     Problem List Patient Active Problem List   Diagnosis Date Noted  . Obese 06/20/2016  . Status post right knee replacement 06/19/2016  . S/P left TKA 06/18/2016  . Spinal stenosis of  lumbar region 09/14/2015    MJoneen BoersPT, DPT   12/17/2017, 11:41 AM  [facility] [address] [place] [address]Phone: [phone]  Fax:  [phone] Name: Destiny JOLLIEMRN: [id number] of Birth: 418-Feb-1952
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
## 2017-12-17 NOTE — Patient Instructions (Addendum)
  Heating pad  You can sit with a heating pad behind your back with a cloth barrier for 15 minutes at a time. Give yourself at least an hour break between heating pad uses.

## 2017-12-19 ENCOUNTER — Ambulatory Visit: Payer: Medicare Other | Admitting: Speech Pathology

## 2017-12-19 ENCOUNTER — Encounter: Payer: Medicare Other | Admitting: Speech Pathology

## 2017-12-19 DIAGNOSIS — R41841 Cognitive communication deficit: Secondary | ICD-10-CM | POA: Diagnosis not present

## 2017-12-20 ENCOUNTER — Encounter: Payer: Self-pay | Admitting: Speech Pathology
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
## 2017-12-24 ENCOUNTER — Encounter: Payer: Medicare Other | Admitting: Speech Pathology

## 2017-12-24 ENCOUNTER — Ambulatory Visit: Payer: Medicare Other | Admitting: Speech Pathology

## 2017-12-26 ENCOUNTER — Ambulatory Visit: Payer: Medicare Other

## 2017-12-26 ENCOUNTER — Encounter: Payer: Self-pay | Admitting: Speech Pathology

## 2017-12-26 ENCOUNTER — Ambulatory Visit: Payer: Medicare Other | Admitting: Speech Pathology

## 2017-12-26 DIAGNOSIS — M25562 Pain in left knee: Secondary | ICD-10-CM

## 2017-12-26 DIAGNOSIS — M5416 Radiculopathy, lumbar region: Secondary | ICD-10-CM

## 2017-12-26 DIAGNOSIS — R41841 Cognitive communication deficit: Secondary | ICD-10-CM | POA: Diagnosis not present

## 2017-12-26 DIAGNOSIS — M6281 Muscle weakness (generalized): Secondary | ICD-10-CM

## 2017-12-26 DIAGNOSIS — G8929 Other chronic pain: Secondary | ICD-10-CM

## 2017-12-26 NOTE — Therapy (Signed)
[place] [facility] [address] [place], [address] Phone: [phone]   Fax:  [phone]  Physical Therapy Treatment  Patient Details  Name: Destiny Shaw MRN: [id number] Date of Birth: May 20, 1950 Referring Provider (PT): Paralee Cancel, MD   Encounter Date: 12/26/2017  PT End of Session - 12/26/17 1036    Visit Number  20    Number of Visits  29    Date for PT Re-Evaluation  01/02/18    Authorization Type  2    Authorization Time Period  of 10 progress note Medicare    PT Start Time  1036    PT Stop Time  1133    PT Time Calculation (min)  57 min    Equipment Utilized During Treatment  Gait belt    Activity Tolerance  Patient tolerated treatment well    Behavior During Therapy  WFL for tasks assessed/performed       Past Medical History:  Diagnosis Date  . ADD (attention deficit disorder)   . Anemia   . Anginal pain ([place])    pt states has occas chest pain relates to indigestion; pt uses rest to relieve   . Anxiety   . Arthritis   . Concussion   . Depression   . Diabetes mellitus without complication ([place])   . Dizziness   . Fall   . GERD (gastroesophageal reflux disease)   . Headache   . History of urinary tract infection   . Hyperlipidemia   . Hypertension   . Hypothyroidism   . IBS (irritable bowel syndrome)   . Imbalance   . Numbness    right leg   . Numbness in both hands    comes and goes   . Pneumonia    last episode approx 1 year ago   . Sleep apnea   . Wears glasses     Past Surgical History:  Procedure Laterality Date  . BREAST CYST ASPIRATION Left 1980's   neg  . BREAST CYST EXCISION Right 1980's   neg  . BREAST LUMPECTOMY Right   . BREAST SURGERY    . CARPAL TUNNEL RELEASE    . CESAREAN SECTION     times 2  . COLONOSCOPY WITH PROPOFOL N/A 05/11/2015   Procedure: COLONOSCOPY WITH PROPOFOL;  Surgeon: Manya Silvas, MD;  Location: [facility] ENDOSCOPY;  Service: Endoscopy;   Laterality: N/A;  . DE QUERVAIN'S RELEASE Right   . ESOPHAGOGASTRODUODENOSCOPY (EGD) WITH PROPOFOL N/A 05/11/2015   Procedure: ESOPHAGOGASTRODUODENOSCOPY (EGD) WITH PROPOFOL;  Surgeon: Manya Silvas, MD;  Location: [facility] ENDOSCOPY;  Service: Endoscopy;  Laterality: N/A;  . EYE SURGERY     laser surgery bilat   . HERNIA REPAIR    . KNEE ARTHROSCOPY    . LUMBAR LAMINECTOMY/DECOMPRESSION MICRODISCECTOMY Bilateral 09/14/2015   Procedure: MICRO LUMBAR BILATERAL DECOMPRESSION L4 - L5;  Surgeon: Susa Day, MD;  Location: [facility] ORS;  Service: Orthopedics;  Laterality: Bilateral;  . REDUCTION MAMMAPLASTY Bilateral 1980  . RHINOPLASTY    . TONSILLECTOMY    . TOTAL KNEE ARTHROPLASTY Left 06/18/2016   Procedure: LEFT TOTAL KNEE ARTHROPLASTY;  Surgeon: Paralee Cancel, MD;  Location: [facility] ORS;  Service: Orthopedics;  Laterality: Left;  Adductor Block  . TUBAL LIGATION    . UVULOPALATOPHARYNGOPLASTY      There were no vitals filed for this visit.  Subjective Assessment - 12/26/17 1037    Subjective  Saw Dr. Ouida Sills who made her an appointment to see  a cardiologist and a type of neurologist. Dizziness is not as bad but it is. Taking the meclizine. Back is sore. Has been hurting her a lot. Has not been able to do her exercises due to her busy appointment schedule.  When she was doing PT and her exercises, her back feels better.  The busy healthcare appointment schedule wears her out.   5/10 back, 4/10 L knee pain (when walking)    Pertinent History  Low back pain. Pt states having back surgery about 2-3 years ago. After she had her L TKA, her back started aggravating her due to her walking. Pt also fell about 4 weeks ago onto her L knee. Dr. Alvan Dame checked out her L knee. L knee was fine but has some inflammation.  Pt tries to keep it iced. Pt still recovering for her L TKA but the fall set her back.   Her L knee surgery was last year.  L knee still bothers her a lot.  Had surgery for her back before her L knee  surgery. Was doing well until her L knee surgery. The fall made it worse. Feels debilitating. Also has a hard time getting into and out of the car mainly due to her L knee.   Pt fell due to her L knee bucking on her. Pt was trying to pick something up from the side of the couch.  Also has a hard time getting up from the floor.  Pt states having bladder accidents since after her fall (pt was recommended to tell her MD about it).  Bowel problems since taking medications (was constapated, but after taking medications, pt had diarrhea which was difficult to control. Getting out of the metformin fixed the bowel issue).  Pt states tingling and numbness L lateral LE along the L5 dermatome.  Denies saddle anesthesia.  Pt states that her back problem is mainly on her L side.  Pt landed on her L knee when she fell.  No other falls within the last 6 months. Pt also states being very dizzy since her L knee surgery.  Had PT treatement for her dizziness before which did not help.  The room feels like it is spinning when she gets dizzy.  I feel like it is getting better then she does something.     Patient Stated Goals  Be better able to get into and out of her vehicle (midsized truck), into and out of chairs, be better able to roll in her bed with less L knee pain. Be able to get down on the floor and get back up.     Currently in Pain?  Yes    Pain Score  5    back   Pain Onset  More than a month ago                               PT Education - 12/26/17 1107    Education Details  ther-ex    Person(s) Educated  Patient    Methods  Explanation;Demonstration;Tactile cues;Verbal cues    Comprehension  Verbalized understanding;Returned demonstration      Objectives    Ther-ex  Standing B shoulder extension with scapular retraction   Red band 10x5 seconds  Yellow band 10x5 seconds   Side stepping with B UE assist 32 ft to the R and 32 ft to the L 2x to promote glute med muscle  use  Standing leg press  resisting blue band with B UE assist 10x3 each LE  Forward step up onto Air ex pad with L LE and one UE assist 10x3, emphasis on femoral control   Standing hip abduction 10x2 each LE with B UE assist              Decreased back pain after aforementioned exercises    Improved exercise technique, movement at target joints, use of target muscles after min to mod verbal, visual, tactile cues.    Manual therapy                Seated STM L vastus lateralis to decrease tension               Seated medial glide L patella to decrease stiffness grade 3                                      Decreased L knee pain after manual therapy   Continued working on trunk, glute med, and max strengthening, thoracic extension, femoral control, decreasing L vastus lateralis tension and improving medial L patellar glide. Decreased back and L knee pain after session. Pt still demonstrates some unsteadiness at times when walking but pt states that she is scheduled to see a specialist for her dizziness. Unable to perform vertigo related treatment due to results from the modified vertebral artery testing. Pt was recommended to ask her specialist to check for vertebral artery insufficiency at her appointment. Pt verbalized understanding. Pt will benefit from continued skilled physical therapy services to decrease back and L knee pain, improve strength and function.          PT Short Term Goals - 12/12/17 0930      PT SHORT TERM GOAL #1   Title  Patient will be independent with her HEP to help decrease back and L knee pain and improve ability to perform functional tasks.     Time  3    Period  Weeks    Status  Achieved    Target Date  12/12/17        PT Long Term Goals - 12/12/17 1308      PT LONG TERM GOAL #1   Title  Patient will have a decrease in back pain to 3/10 or less at worst to promote ability to ambulate, turn in bed, perform standing tasks with less  pain.     Baseline  6/10 back pain at most (09/24/2017); 7-8/10 at most for the past 7 days, duration of pain is better since starting PT (10/29/2017); 5-6/10 at most for the past 7 days (11/18/2017); 8/10 at most for the past 7 days but the duration of pain is less compared to prior to starting PT (12/12/2017)    Time  6    Period  Weeks    Status  On-going    Target Date  01/02/18      PT LONG TERM GOAL #2   Title  Patient will have a decrease in L knee pain to 3/10 or less at worst to promote ability to ambulate, negotiate stairs, get into and out of a car more comfortably.     Baseline  6/10 L knee pain at most for the past 3 months (09/24/2017); 7-8/10 at most for the past 7 days, duration of pain is better since starting PT (10/29/2017); 7/10 at most for the past 7 days (11/18/2017); 6/10  L knee pain at most for the past 7 days (12/12/2017)    Time  6    Period  Weeks    Status  On-going    Target Date  01/02/18      PT LONG TERM GOAL #3   Title  Patient will improve TUG time to 12 seconds or less as a demonstration of improved functional mobility and balance.     Baseline  TUG no AD: 16.05 seconds on average (09/24/2017); 14.3 seconds on average (10/29/2017); 13 seconds average (11/21/2017); 16.67 seconds average (12/12/2017)    Time  6    Period  Weeks    Status  Partially Met    Target Date  01/02/18      PT LONG TERM GOAL #4   Title  Patient will improve her back FOTO score by at least 10 points as a demonstration of improved funtion.      Baseline  Back FOTO: 33 (09/24/2017); 38 (10/29/2017); 36 (11/21/2017); 40 (12/12/2017)    Time  6    Period  Weeks    Status  On-going    Target Date  01/02/18      PT LONG TERM GOAL #5   Title  Patient will improve bilateral LE strength by at least 1/2 MMT grade to promote ability to perform standing tasks.     Time  6    Period  Weeks    Status  Partially Met      PT LONG TERM GOAL #6   Title  Patient will improve her Modified Oswestry Low Back  pain disablity questionnaire by at least 10% as a demonstration of improved function.     Baseline  48% (09/24/2017); 46% (10/29/2017), (11/21/2017); 56% (12/12/2017)    Time  6    Period  Weeks    Status  On-going    Target Date  01/02/18            Plan - 12/26/17 1033    Clinical Impression Statement  Continued working on trunk, glute med, and max strengthening, thoracic extension, femoral control, decreasing L vastus lateralis tension and improving medial L patellar glide. Decreased back and L knee pain after session. Pt still demonstrates some unsteadiness at times when walking but pt states that she is scheduled to see a specialist for her dizziness. Unable to perform vertigo related treatment due to results from the modified vertebral artery testing. Pt was recommended to ask her specialist to check for vertebral artery insufficiency at her appointment. Pt verbalized understanding. Pt will benefit from continued skilled physical therapy services to decrease back and L knee pain, improve strength and function.     Rehab Potential  Fair    Clinical Impairments Affecting Rehab Potential  (-) chronicity of condition, multiple areas of pain, medical history, age; (+) motivated, husband support    PT Frequency  2x / week    PT Duration  6 weeks    PT Treatment/Interventions  Therapeutic activities;Therapeutic exercise;Balance training;Neuromuscular re-education;Patient/family education;Manual techniques;Dry needling;Aquatic Therapy;Electrical Stimulation;Iontophoresis 62m/ml Dexamethasone;Gait training    PT Next Visit Plan  hip and knee strengthening, core strengthening, patellar mobility, manual techniques, modalities PRN    Consulted and Agree with Plan of Care  Patient       Patient will benefit from skilled therapeutic intervention in order to improve the following deficits and impairments:  Pain, Improper body mechanics, Postural dysfunction, Dizziness, Decreased strength, Difficulty  walking  Visit Diagnosis: Muscle weakness (generalized)  Chronic pain of left  knee  Radiculopathy, lumbar region     Problem List Patient Active Problem List   Diagnosis Date Noted  . Obese 06/20/2016  . Status post right knee replacement 06/19/2016  . S/P left TKA 06/18/2016  . Spinal stenosis of lumbar region 09/14/2015    Joneen Boers PT, DPT   12/26/2017, 11:58 AM  [place] [facility] [address], [place], [address] Phone: [phone]   Fax:  [phone]  Name: Destiny Shaw MRN: [id number] Date of Birth: 01/09/1951
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
## 2017-12-30 ENCOUNTER — Ambulatory Visit: Payer: Medicare Other | Attending: Orthopedic Surgery

## 2017-12-30 DIAGNOSIS — M5416 Radiculopathy, lumbar region: Secondary | ICD-10-CM | POA: Insufficient documentation

## 2017-12-30 DIAGNOSIS — Z9181 History of falling: Secondary | ICD-10-CM | POA: Insufficient documentation

## 2017-12-30 DIAGNOSIS — M6281 Muscle weakness (generalized): Secondary | ICD-10-CM | POA: Insufficient documentation

## 2017-12-30 DIAGNOSIS — R42 Dizziness and giddiness: Secondary | ICD-10-CM | POA: Insufficient documentation

## 2017-12-30 DIAGNOSIS — M25562 Pain in left knee: Secondary | ICD-10-CM | POA: Insufficient documentation

## 2017-12-30 DIAGNOSIS — R41841 Cognitive communication deficit: Secondary | ICD-10-CM | POA: Insufficient documentation

## 2017-12-30 DIAGNOSIS — G8929 Other chronic pain: Secondary | ICD-10-CM | POA: Insufficient documentation

## 2018-01-02 ENCOUNTER — Ambulatory Visit: Payer: Medicare Other | Admitting: Speech Pathology

## 2018-01-02 ENCOUNTER — Ambulatory Visit: Payer: Medicare Other

## 2018-01-02 DIAGNOSIS — Z9181 History of falling: Secondary | ICD-10-CM | POA: Diagnosis present

## 2018-01-02 DIAGNOSIS — R41841 Cognitive communication deficit: Secondary | ICD-10-CM

## 2018-01-02 DIAGNOSIS — M6281 Muscle weakness (generalized): Secondary | ICD-10-CM | POA: Diagnosis present

## 2018-01-02 DIAGNOSIS — M5416 Radiculopathy, lumbar region: Secondary | ICD-10-CM

## 2018-01-02 DIAGNOSIS — M25562 Pain in left knee: Secondary | ICD-10-CM | POA: Diagnosis present

## 2018-01-02 DIAGNOSIS — G8929 Other chronic pain: Secondary | ICD-10-CM

## 2018-01-02 DIAGNOSIS — R42 Dizziness and giddiness: Secondary | ICD-10-CM

## 2018-01-02 NOTE — Therapy (Signed)
[facility] [address] [place], [address] Phone: [phone]   Fax:  [phone]  Physical Therapy Treatment  Patient Details  Name: Destiny Shaw MRN: [id number] Date of Birth: November 12, 1950 Referring Provider (PT): Paralee Cancel, MD   Encounter Date: 01/02/2018  PT End of Session - 01/02/18 1351    Visit Number  21    Number of Visits  41    Date for PT Re-Evaluation  02/13/18    Authorization Type  3    Authorization Time Period  of 10 progress note Medicare    PT Start Time  1351    PT Stop Time  1455    PT Time Calculation (min)  64 min    Equipment Utilized During Treatment  Gait belt    Activity Tolerance  Patient tolerated treatment well    Behavior During Therapy  WFL for tasks assessed/performed       Past Medical History:  Diagnosis Date  . ADD (attention deficit disorder)   . Anemia   . Anginal pain ([place])    pt states has occas chest pain relates to indigestion; pt uses rest to relieve   . Anxiety   . Arthritis   . Concussion   . Depression   . Diabetes mellitus without complication ([place])   . Dizziness   . Fall   . GERD (gastroesophageal reflux disease)   . Headache   . History of urinary tract infection   . Hyperlipidemia   . Hypertension   . Hypothyroidism   . IBS (irritable bowel syndrome)   . Imbalance   . Numbness    right leg   . Numbness in both hands    comes and goes   . Pneumonia    last episode approx 1 year ago   . Sleep apnea   . Wears glasses     Past Surgical History:  Procedure Laterality Date  . BREAST CYST ASPIRATION Left 1980's   neg  . BREAST CYST EXCISION Right 1980's   neg  . BREAST LUMPECTOMY Right   . BREAST SURGERY    . CARPAL TUNNEL RELEASE    . CESAREAN SECTION     times 2  . COLONOSCOPY WITH PROPOFOL N/A 05/11/2015   Procedure: COLONOSCOPY WITH PROPOFOL;  Surgeon: Manya Silvas, MD;  Location: [facility] ENDOSCOPY;  Service: Endoscopy;   Laterality: N/A;  . DE QUERVAIN'S RELEASE Right   . ESOPHAGOGASTRODUODENOSCOPY (EGD) WITH PROPOFOL N/A 05/11/2015   Procedure: ESOPHAGOGASTRODUODENOSCOPY (EGD) WITH PROPOFOL;  Surgeon: Manya Silvas, MD;  Location: [facility] ENDOSCOPY;  Service: Endoscopy;  Laterality: N/A;  . EYE SURGERY     laser surgery bilat   . HERNIA REPAIR    . KNEE ARTHROSCOPY    . LUMBAR LAMINECTOMY/DECOMPRESSION MICRODISCECTOMY Bilateral 09/14/2015   Procedure: MICRO LUMBAR BILATERAL DECOMPRESSION L4 - L5;  Surgeon: Susa Day, MD;  Location: [facility] ORS;  Service: Orthopedics;  Laterality: Bilateral;  . REDUCTION MAMMAPLASTY Bilateral 1980  . RHINOPLASTY    . TONSILLECTOMY    . TOTAL KNEE ARTHROPLASTY Left 06/18/2016   Procedure: LEFT TOTAL KNEE ARTHROPLASTY;  Surgeon: Paralee Cancel, MD;  Location: [facility] ORS;  Service: Orthopedics;  Laterality: Left;  Adductor Block  . TUBAL LIGATION    . UVULOPALATOPHARYNGOPLASTY      There were no vitals filed for this visit.  Subjective Assessment - 01/02/18 1353    Subjective  Back is better than it was yesterday. No back pain  currently. 7/10 back pain at most for the past 7 days. No L knee pain currently (5/10 L knee pain at most for the past 7 days).  PT is helping her a lot for her back and L knee pain.  Better able to walk longer and more comfortably for her back and L knee.  Doing her daily activities is better for her back and knee such as walking around her house, going outside her yard, doing light housework.  Living is not as hard for her back and L knee.  Has been doing her home exercises as well.  Her home exercises helps her get relief from her pain.  Balance is better.   Got the blood vessels in her neck checked.  Pt also wants to be able to step into and out of the bathtub, get into and out of her truck better. Has fallen a few times since October. Last time she fell was last week. Pt was trying to bend over to get something towards the bottom of the closet. Dizziness felt  better after her carotid arteries were checked.       Pertinent History  Low back pain. Pt states having back surgery about 2-3 years ago. After she had her L TKA, her back started aggravating her due to her walking. Pt also fell about 4 weeks ago onto her L knee. Dr. Alvan Dame checked out her L knee. L knee was fine but has some inflammation.  Pt tries to keep it iced. Pt still recovering for her L TKA but the fall set her back.   Her L knee surgery was last year.  L knee still bothers her a lot.  Had surgery for her back before her L knee surgery. Was doing well until her L knee surgery. The fall made it worse. Feels debilitating. Also has a hard time getting into and out of the car mainly due to her L knee.   Pt fell due to her L knee bucking on her. Pt was trying to pick something up from the side of the couch.  Also has a hard time getting up from the floor.  Pt states having bladder accidents since after her fall (pt was recommended to tell her MD about it).  Bowel problems since taking medications (was constapated, but after taking medications, pt had diarrhea which was difficult to control. Getting out of the metformin fixed the bowel issue).  Pt states tingling and numbness L lateral LE along the L5 dermatome.  Denies saddle anesthesia.  Pt states that her back problem is mainly on her L side.  Pt landed on her L knee when she fell.  No other falls within the last 6 months. Pt also states being very dizzy since her L knee surgery.  Had PT treatement for her dizziness before which did not help.  The room feels like it is spinning when she gets dizzy.  I feel like it is getting better then she does something.     Patient Stated Goals  Be better able to get into and out of her vehicle (midsized truck), into and out of chairs, be better able to roll in her bed with less L knee pain. Be able to get down on the floor and get back up.     Currently in Pain?  No/denies    Pain Score  0-No pain    Pain Onset  More  than a month ago  [facility] PT Assessment - 01/02/18 1402      Observation/Other Assessments   Observations  TUG no AD 14.17 seconds average    Focus on Therapeutic Outcomes (FOTO)   Back FOTO: 42    Modified [place]  36%      Strength   Right Hip Flexion  4/5    Right Hip Extension  4+/5   seated manually resisted hip extension   Right Hip ABduction  5/5   seated manually resisted clamshell isometrics   Left Hip Flexion  4/5    Left Hip Extension  4+/5   seated manually resisted hip extension   Left Hip ABduction  5/5   seated manually resisted clamshell isometrics   Right Knee Flexion  4+/5    Right Knee Extension  5/5    Left Knee Flexion  4+/5    Left Knee Extension  5/5      Berg Balance Test   Sit to Stand  Able to stand without using hands and stabilize independently    Standing Unsupported  Able to stand safely 2 minutes    Sitting with Back Unsupported but Feet Supported on Floor or Stool  Able to sit safely and securely 2 minutes    Stand to Sit  Sits safely with minimal use of hands    Transfers  Able to transfer with verbal cueing and /or supervision    Standing Unsupported with Eyes Closed  Able to stand 10 seconds with supervision    Standing Ubsupported with Feet Together  Able to place feet together independently but unable to hold for 30 seconds    From Standing, Reach Forward with Outstretched Arm  Can reach forward >12 cm safely (5")    From Standing Position, Pick up Object from Floor  Able to pick up shoe, needs supervision    From Standing Position, Turn to Look Behind Over each Shoulder  Looks behind one side only/other side shows less weight shift    Turn 360 Degrees  Needs close supervision or verbal cueing    Standing Unsupported, Alternately Place Feet on Step/Stool  Able to complete 4 steps without aid or supervision   Backward lean with hip flexion unassisted   Standing Unsupported, One Foot in Front  Able to plae foot ahead of the other  independently and hold 30 seconds    Standing on One Leg  Able to lift leg independently and hold > 10 seconds    Total Score  42    Berg comment:  Less than 46/56 suggests increased fall risk                           PT Education - 01/02/18 1401    Education Details  ther-ex    [employer]) Educated  Patient    Methods  Explanation;Demonstration;Tactile cues;Verbal cues    Comprehension  Returned demonstration;Verbalized understanding      Objectives    Ther-ex  Seated manually resisted hip flexion, hip extension, hip abduction, knee flexion, knee extension 1-2x each way for each LE  Reviewed progress/current status with strength with pt.     Standing up from a chair, walking 10 ft forward, then returning 10 ft, then sitting back onto chair 5x  16.23 sec, 16.30 sec,   Pt realized not to pause when performing activity: 13.47 seconds, 15.19 seconds, 13.85 seconds (14.17 average of the last 3 attempts)  Directed patient with sit <> stand throughout session Stand pivot  transfer chair <> low mat table Static standing shoulder width apart, then with eyes closed, then with eyes open feet together, tandem stance, then with feet shoulder width apart (Light headedness with tandem stance balance exercise. Blood pressure L arm sitting, mechanically taken, normal cuff: 159/81, HR 79) Picking up an object (computer mouse) from the floor,  Turning 360 degrees to the R and to the L 1x Looking behind to the R and to the L,  Standing forward reach,   Standing alternate toe taps 4 x each LE onto first regular step  Reviewed plan of care: continue 2x/week for 6 weeks   Blood pressure L arm sitting after BERG activities: 144/80, HR 76   Time taken to answer patient questions.    Improved exercise technique, movement at target joints, use of target muscles after mod verbal, visual, tactile cues.   Pt demonstrates improved ability to manage her back and L knee pain in  which pt states performing her home exercise program helps decrease pain in both areas. Pt also demonstrates overall improved bilateral LE strength, and ability to perform functional tasks since initial evaluation. Pt still demonstrates back and L knee pain, LE weakness, decreased balance and difficulty performing tasks such as stepping into and out of her bathtub as well as getting into and out of her truck and would benefit from continued skilled physical therapy services to address the aforementioned deficits.       PT Short Term Goals - 12/12/17 0930      PT SHORT TERM GOAL #1   Title  Patient will be independent with her HEP to help decrease back and L knee pain and improve ability to perform functional tasks.     Time  3    Period  Weeks    Status  Achieved    Target Date  12/12/17        PT Long Term Goals - 01/02/18 1507      PT LONG TERM GOAL #1   Title  Patient will have a decrease in back pain to 3/10 or less at worst to promote ability to ambulate, turn in bed, perform standing tasks with less pain.     Baseline  6/10 back pain at most (09/24/2017); 7-8/10 at most for the past 7 days, duration of pain is better since starting PT (10/29/2017); 5-6/10 at most for the past 7 days (11/18/2017); 8/10 at most for the past 7 days but the duration of pain is less compared to prior to starting PT (12/12/2017).  7/10 back pain at most for the past 7 days (pt states doing her exercises helps decrease her pain; 01/02/2018)    Time  6    Period  Weeks    Status  On-going    Target Date  02/13/18      PT LONG TERM GOAL #2   Title  Patient will have a decrease in L knee pain to 3/10 or less at worst to promote ability to ambulate, negotiate stairs, get into and out of a car more comfortably.     Baseline  6/10 L knee pain at most for the past 3 months (09/24/2017); 7-8/10 at most for the past 7 days, duration of pain is better since starting PT (10/29/2017); 7/10 at most for the past 7 days  (11/18/2017); 6/10 L knee pain at most for the past 7 days (12/12/2017); 5/10 L knee pain at most for the past 7 days (pt states doing her exercises helps decrease  pain; 01/02/2018)    Time  6    Period  Weeks    Status  On-going    Target Date  02/13/18      PT LONG TERM GOAL #3   Title  Patient will improve TUG time to 12 seconds or less as a demonstration of improved functional mobility and balance.     Baseline  TUG no AD: 16.05 seconds on average (09/24/2017); 14.3 seconds on average (10/29/2017); 13 seconds average (11/21/2017); 16.67 seconds average (12/12/2017);  14.17 seconds average (01/02/2018)    Time  6    Period  Weeks    Status  Partially Met    Target Date  02/13/18      PT LONG TERM GOAL #4   Title  Patient will improve her back FOTO score by at least 10 points as a demonstration of improved funtion.      Baseline  Back FOTO: 33 (09/24/2017); 38 (10/29/2017); 36 (11/21/2017); 40 (12/12/2017); 42 (01/02/2018)    Time  6    Period  Weeks    Status  On-going    Target Date  02/13/18      PT LONG TERM GOAL #5   Title  Patient will improve bilateral LE strength by at least 1/2 MMT grade to promote ability to perform standing tasks.     Time  6    Period  Weeks    Status  Achieved    Target Date  01/02/18      PT LONG TERM GOAL #6   Title  Patient will improve her Modified Oswestry Low Back pain disablity questionnaire by at least 10% as a demonstration of improved function.     Baseline  48% (09/24/2017); 46% (10/29/2017), (11/21/2017); 56% (12/12/2017); 36% (01/02/2018)    Time  6    Period  Weeks    Status  Achieved    Target Date  01/02/18      PT LONG TERM GOAL #7   Title  Pt will report decreased difficulty stepping into and out of her bathtub as well as getting into and out of her truck to promote ability to get into and out of places.     Baseline  Pt reports difficulty stepping into and out of her bathtub as well as difficulty getting into and out of her truck (01/02/2018)     Time  6    Period  Weeks    Status  New    Target Date  02/13/18      PT LONG TERM GOAL #8   Title  Patient will improve her BERG balance test score to 46/56 or more as a demonstration of decreased fall risk     Baseline  42/56 (01/02/2018)    Time  6    Period  Weeks    Status  New    Target Date  02/13/18            Plan - 01/02/18 1505    Clinical Impression Statement  Pt demonstrates improved ability to manage her back and L knee pain in which pt states performing her home exercise program helps decrease pain in both areas. Pt also demonstrates overall improved bilateral LE strength, and ability to perform functional tasks since initial evaluation. Pt still demonstrates back and L knee pain, LE weakness, decreased balance and difficulty performing tasks such as stepping into and out of her bathtub as well as getting into and out of her truck and would benefit from continued  skilled physical therapy services to address the aforementioned deficits.      History and Personal Factors relevant to plan of care:   Chronic low back pain, L LE radiating symptoms, L knee pain, hx of fall, medical history, LE weakness, difficulty getting into and out of the car, stair negotiatin, bed mobility,  and walking due to back and L knee pain.      Clinical Presentation  Stable    Clinical Presentation due to:  pt making progress with PT towards goals    Clinical Decision Making  Low    Rehab Potential  Fair    Clinical Impairments Affecting Rehab Potential  (-) chronicity of condition, multiple areas of pain, medical history, age; (+) motivated, husband support    PT Frequency  2x / week    PT Duration  6 weeks    PT Treatment/Interventions  Therapeutic activities;Therapeutic exercise;Balance training;Neuromuscular re-education;Patient/family education;Manual techniques;Dry needling;Aquatic Therapy;Electrical Stimulation;Iontophoresis 31m/ml Dexamethasone;Gait training    PT Next Visit Plan  hip  and knee strengthening, core strengthening, patellar mobility, manual techniques, modalities PRN    Consulted and Agree with Plan of Care  Patient       Patient will benefit from skilled therapeutic intervention in order to improve the following deficits and impairments:  Pain, Improper body mechanics, Postural dysfunction, Dizziness, Decreased strength, Difficulty walking  Visit Diagnosis: Muscle weakness (generalized) - Plan: PT plan of care cert/re-cert  Chronic pain of left knee - Plan: PT plan of care cert/re-cert  Radiculopathy, lumbar region - Plan: PT plan of care cert/re-cert  History of falling - Plan: PT plan of care cert/re-cert  Dizziness and giddiness - Plan: PT plan of care cert/re-cert     Problem List Patient Active Problem List   Diagnosis Date Noted  . Obese 06/20/2016  . Status post right knee replacement 06/19/2016  . S/P left TKA 06/18/2016  . Spinal stenosis of lumbar region 09/14/2015     MJoneen BoersPT, DPT  01/02/2018, 7:22 PM  [facility] [address] [place] [address]Phone: [phone]  Fax:  [phone] Name: REVALINE WALTMANMRN: [id number] of Birth: [birth year]

## 2018-01-03 ENCOUNTER — Encounter: Payer: Self-pay | Admitting: Speech Pathology
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
## 2018-01-06 ENCOUNTER — Encounter: Payer: Medicare Other | Admitting: Speech Pathology

## 2018-01-06 ENCOUNTER — Ambulatory Visit: Payer: Medicare Other

## 2018-01-06 DIAGNOSIS — M25562 Pain in left knee: Secondary | ICD-10-CM

## 2018-01-06 DIAGNOSIS — Z9181 History of falling: Secondary | ICD-10-CM

## 2018-01-06 DIAGNOSIS — R42 Dizziness and giddiness: Secondary | ICD-10-CM | POA: Diagnosis not present

## 2018-01-06 DIAGNOSIS — G8929 Other chronic pain: Secondary | ICD-10-CM

## 2018-01-06 DIAGNOSIS — M6281 Muscle weakness (generalized): Secondary | ICD-10-CM

## 2018-01-06 DIAGNOSIS — M5416 Radiculopathy, lumbar region: Secondary | ICD-10-CM

## 2018-01-06 NOTE — Patient Instructions (Addendum)
MedbridgeAccess Code: [email or web address]    Seated Posterior Pelvic Tilt  10x3  Seated Flexion Stretch with Swiss Ball  Substitute dining room table for the ball  10x3 with 5 second holds

## 2018-01-06 NOTE — Therapy (Signed)
[place] [facility] [address] [place], [address] Phone: [phone]   Fax:  [phone]  Physical Therapy Treatment  Patient Details  Name: Destiny Shaw MRN: [id number] Date of Birth: 02-28-1950 Referring Provider (PT): Paralee Cancel, MD   Encounter Date: 01/06/2018  PT End of Session - 01/06/18 1037    Visit Number  22    Number of Visits  41    Date for PT Re-Evaluation  02/13/18    Authorization Type  4    Authorization Time Period  of 10 progress note Medicare    PT Start Time  1037    PT Stop Time  1117    PT Time Calculation (min)  40 min    Equipment Utilized During Treatment  Gait belt    Activity Tolerance  Patient tolerated treatment well    Behavior During Therapy  WFL for tasks assessed/performed       Past Medical History:  Diagnosis Date  . ADD (attention deficit disorder)   . Anemia   . Anginal pain ([place])    pt states has occas chest pain relates to indigestion; pt uses rest to relieve   . Anxiety   . Arthritis   . Concussion   . Depression   . Diabetes mellitus without complication ([place])   . Dizziness   . Fall   . GERD (gastroesophageal reflux disease)   . Headache   . History of urinary tract infection   . Hyperlipidemia   . Hypertension   . Hypothyroidism   . IBS (irritable bowel syndrome)   . Imbalance   . Numbness    right leg   . Numbness in both hands    comes and goes   . Pneumonia    last episode approx 1 year ago   . Sleep apnea   . Wears glasses     Past Surgical History:  Procedure Laterality Date  . BREAST CYST ASPIRATION Left 1980's   neg  . BREAST CYST EXCISION Right 1980's   neg  . BREAST LUMPECTOMY Right   . BREAST SURGERY    . CARPAL TUNNEL RELEASE    . CESAREAN SECTION     times 2  . COLONOSCOPY WITH PROPOFOL N/A 05/11/2015   Procedure: COLONOSCOPY WITH PROPOFOL;  Surgeon: Manya Silvas, MD;  Location: [facility] ENDOSCOPY;  Service: Endoscopy;   Laterality: N/A;  . DE QUERVAIN'S RELEASE Right   . ESOPHAGOGASTRODUODENOSCOPY (EGD) WITH PROPOFOL N/A 05/11/2015   Procedure: ESOPHAGOGASTRODUODENOSCOPY (EGD) WITH PROPOFOL;  Surgeon: Manya Silvas, MD;  [facility]: [facility] ENDOSCOPY;  Service: Endoscopy;  Laterality: N/A;  . EYE SURGERY     laser surgery bilat   . HERNIA REPAIR    . KNEE ARTHROSCOPY    . LUMBAR LAMINECTOMY/DECOMPRESSION MICRODISCECTOMY Bilateral 09/14/2015   Procedure: MICRO LUMBAR BILATERAL DECOMPRESSION L4 - L5;  Surgeon: Susa Day, MD;  Location: [facility] ORS;  Service: Orthopedics;  Laterality: Bilateral;  . REDUCTION MAMMAPLASTY Bilateral 1980  . RHINOPLASTY    . TONSILLECTOMY    . TOTAL KNEE ARTHROPLASTY Left 06/18/2016   Procedure: LEFT TOTAL KNEE ARTHROPLASTY;  Surgeon: Paralee Cancel, MD;  Location: [facility] ORS;  Service: Orthopedics;  Laterality: Left;  Adductor Block  . TUBAL LIGATION    . UVULOPALATOPHARYNGOPLASTY      There were no vitals filed for this visit.  Subjective Assessment - 01/06/18 1039    Subjective  Back is a little sore and a little painful. Has  been bothering her since her exercises yesterday.  Does not remember which exercises she did. Feels a dull ache currently.    L knee used to hurt constantly. Now L knee hurts occasionally.  0/10 L knee currently.  Pt states that the log rolling helps with her back.     Pertinent History  Low back pain. Pt states having back surgery about 2-3 years ago. After she had her L TKA, her back started aggravating her due to her walking. Pt also fell about 4 weeks ago onto her L knee. Dr. Alvan Dame checked out her L knee. L knee was fine but has some inflammation.  Pt tries to keep it iced. Pt still recovering for her L TKA but the fall set her back.   Her L knee surgery was last year.  L knee still bothers her a lot.  Had surgery for her back before her L knee surgery. Was doing well until her L knee surgery. The fall made it worse. Feels debilitating. Also has a hard time getting  into and out of the car mainly due to her L knee.   Pt fell due to her L knee bucking on her. Pt was trying to pick something up from the side of the couch.  Also has a hard time getting up from the floor.  Pt states having bladder accidents since after her fall (pt was recommended to tell her MD about it).  Bowel problems since taking medications (was constapated, but after taking medications, pt had diarrhea which was difficult to control. Getting out of the metformin fixed the bowel issue).  Pt states tingling and numbness L lateral LE along the L5 dermatome.  Denies saddle anesthesia.  Pt states that her back problem is mainly on her L side.  Pt landed on her L knee when she fell.  No other falls within the last 6 months. Pt also states being very dizzy since her L knee surgery.  Had PT treatement for her dizziness before which did not help.  The room feels like it is spinning when she gets dizzy.  I feel like it is getting better then she does something.     Patient Stated Goals  Be better able to get into and out of her vehicle (midsized truck), into and out of chairs, be better able to roll in her bed with less L knee pain. Be able to get down on the floor and get back up.     Currently in Pain?  Yes    Pain Score  3    2.5-3/10 back ache currently   Pain Location  Back    Pain Descriptors / Indicators  Aching    Pain Onset  More than a month ago                               PT Education - 01/06/18 1044    Education Details  ther-ex, HEP    Person(s) Educated  Patient    Methods  Demonstration;Explanation;Tactile cues;Verbal cues;Handout    Comprehension  Returned demonstration;Verbalized understanding      Objectives  MedbridgeAccess Code: [email or web address]  Latex glove allergies     Ther-ex  Seated manually resisted trunk flexion 10x5 seconds for 3 sets  Standing straight pallof press resisting double yellow band 10x5 seconds   Seated posterior pelvic  tilts 10x3. Felt good for back  Seated physioball rolls  Flexion 10x5 seconds. Felt  good for her back   Seated trunk flexion table slide 10x5 seconds  Reviewed HEP. Pt demonstrated and verablied understanding  Standing low rows resisting yellow band 10x2 with 5 second holds  Standing hip abduction 10x each LE to promote glute med muscle use.     Improved exercise technique, movement at target joints, use of target muscles after mod verbal, visual, tactile cues.    Pt demonstrates overall improved L knee pain based on subjective reports with knee pain improving to occasional occurrence instead of being constant. Continued to work in decreasing low back pain in which flexion based exercises seem to help decrease her ache. Pt tolerated session well without aggravation of symptoms. Pt will benefit from continued skilled physical therapy services to decrease pain, improve strength and function.       PT Short Term Goals - 12/12/17 0930      PT SHORT TERM GOAL #1   Title  Patient will be independent with her HEP to help decrease back and L knee pain and improve ability to perform functional tasks.     Time  3    Period  Weeks    Status  Achieved    Target Date  12/12/17        PT Long Term Goals - 01/02/18 1507      PT LONG TERM GOAL #1   Title  Patient will have a decrease in back pain to 3/10 or less at worst to promote ability to ambulate, turn in bed, perform standing tasks with less pain.     Baseline  6/10 back pain at most (09/24/2017); 7-8/10 at most for the past 7 days, duration of pain is better since starting PT (10/29/2017); 5-6/10 at most for the past 7 days (11/18/2017); 8/10 at most for the past 7 days but the duration of pain is less compared to prior to starting PT (12/12/2017).  7/10 back pain at most for the past 7 days (pt states doing her exercises helps decrease her pain; 01/02/2018)    Time  6    Period  Weeks    Status  On-going    Target Date  02/13/18       PT LONG TERM GOAL #2   Title  Patient will have a decrease in L knee pain to 3/10 or less at worst to promote ability to ambulate, negotiate stairs, get into and out of a car more comfortably.     Baseline  6/10 L knee pain at most for the past 3 months (09/24/2017); 7-8/10 at most for the past 7 days, duration of pain is better since starting PT (10/29/2017); 7/10 at most for the past 7 days (11/18/2017); 6/10 L knee pain at most for the past 7 days (12/12/2017); 5/10 L knee pain at most for the past 7 days (pt states doing her exercises helps decrease pain; 01/02/2018)    Time  6    Period  Weeks    Status  On-going    Target Date  02/13/18      PT LONG TERM GOAL #3   Title  Patient will improve TUG time to 12 seconds or less as a demonstration of improved functional mobility and balance.     Baseline  TUG no AD: 16.05 seconds on average (09/24/2017); 14.3 seconds on average (10/29/2017); 13 seconds average (11/21/2017); 16.67 seconds average (12/12/2017);  14.17 seconds average (01/02/2018)    Time  6    Period  Weeks    Status  Partially Met    Target Date  02/13/18      PT LONG TERM GOAL #4   Title  Patient will improve her back FOTO score by at least 10 points as a demonstration of improved funtion.      Baseline  Back FOTO: 33 (09/24/2017); 38 (10/29/2017); 36 (11/21/2017); 40 (12/12/2017); 42 (01/02/2018)    Time  6    Period  Weeks    Status  On-going    Target Date  02/13/18      PT LONG TERM GOAL #5   Title  Patient will improve bilateral LE strength by at least 1/2 MMT grade to promote ability to perform standing tasks.     Time  6    Period  Weeks    Status  Achieved    Target Date  01/02/18      PT LONG TERM GOAL #6   Title  Patient will improve her Modified Oswestry Low Back pain disablity questionnaire by at least 10% as a demonstration of improved function.     Baseline  48% (09/24/2017); 46% (10/29/2017), (11/21/2017); 56% (12/12/2017); 36% (01/02/2018)    Time  6    Period  Weeks     Status  Achieved    Target Date  01/02/18      PT LONG TERM GOAL #7   Title  Pt will report decreased difficulty stepping into and out of her bathtub as well as getting into and out of her truck to promote ability to get into and out of places.     Baseline  Pt reports difficulty stepping into and out of her bathtub as well as difficulty getting into and out of her truck (01/02/2018)    Time  6    Period  Weeks    Status  New    Target Date  02/13/18      PT LONG TERM GOAL #8   Title  Patient will improve her BERG balance test score to 46/56 or more as a demonstration of decreased fall risk     Baseline  42/56 (01/02/2018)    Time  6    Period  Weeks    Status  New    Target Date  02/13/18            Plan - 01/06/18 1046    Clinical Impression Statement  Pt demonstrates overall improved L knee pain based on subjective reports with knee pain improving to occasional occurrence instead of being constant. Continued to work in decreasing low back pain in which flexion based exercises seem to help decrease her ache. Pt tolerated session well without aggravation of symptoms. Pt will benefit from continued skilled physical therapy services to decrease pain, improve strength and function.     Rehab Potential  Fair    Clinical Impairments Affecting Rehab Potential  (-) chronicity of condition, multiple areas of pain, medical history, age; (+) motivated, husband support    PT Frequency  2x / week    PT Duration  6 weeks    PT Treatment/Interventions  Therapeutic activities;Therapeutic exercise;Balance training;Neuromuscular re-education;Patient/family education;Manual techniques;Dry needling;Aquatic Therapy;Electrical Stimulation;Iontophoresis 24m/ml Dexamethasone;Gait training    PT Next Visit Plan  hip and knee strengthening, core strengthening, patellar mobility, manual techniques, modalities PRN    Consulted and Agree with Plan of Care  Patient       Patient will benefit from skilled  therapeutic intervention in order to improve the following deficits and impairments:  Pain, Improper body mechanics,  Postural dysfunction, Dizziness, Decreased strength, Difficulty walking  Visit Diagnosis: Muscle weakness (generalized)  Chronic pain of left knee  Radiculopathy, lumbar region  History of falling     Problem List Patient Active Problem List   Diagnosis Date Noted  . Obese 06/20/2016  . Status post right knee replacement 06/19/2016  . S/P left TKA 06/18/2016  . Spinal stenosis of lumbar region 09/14/2015    Joneen Boers PT, DPT   01/06/2018, 12:49 PM  [place] [facility] [address] [place], [address] Phone: [phone]   Fax:  [phone]  Name: Destiny Shaw MRN: [id number] Date of Birth: Apr 19, 1950

## 2018-01-07 ENCOUNTER — Encounter: Payer: Medicare Other | Admitting: Speech Pathology

## 2018-01-09 ENCOUNTER — Ambulatory Visit: Payer: Medicare Other

## 2018-01-09 ENCOUNTER — Ambulatory Visit: Payer: Medicare Other | Admitting: Speech Pathology

## 2018-01-09 DIAGNOSIS — R41841 Cognitive communication deficit: Secondary | ICD-10-CM

## 2018-01-09 DIAGNOSIS — G8929 Other chronic pain: Secondary | ICD-10-CM

## 2018-01-09 DIAGNOSIS — R42 Dizziness and giddiness: Secondary | ICD-10-CM | POA: Diagnosis not present

## 2018-01-09 DIAGNOSIS — M5416 Radiculopathy, lumbar region: Secondary | ICD-10-CM

## 2018-01-09 DIAGNOSIS — M25562 Pain in left knee: Secondary | ICD-10-CM

## 2018-01-09 DIAGNOSIS — M6281 Muscle weakness (generalized): Secondary | ICD-10-CM

## 2018-01-09 NOTE — Therapy (Signed)
[facility] [address], [place], [address] Phone: [phone]   Fax:  [phone]  Physical Therapy Treatment  Patient Details  Name: Destiny Shaw MRN: [id number] Date of Birth: 03/28/1950 Referring Provider (PT): Paralee Cancel, MD   Encounter Date: 01/09/2018  PT End of Session - 01/09/18 1044    Visit Number  23    Number of Visits  41    Date for PT Re-Evaluation  02/13/18    Authorization Type  5    Authorization Time Period  of 10 progress note Medicare    PT Start Time  1045    PT Stop Time  1127    PT Time Calculation (min)  42 min    Equipment Utilized During Treatment  Gait belt    Activity Tolerance  Patient tolerated treatment well    Behavior During Therapy  WFL for tasks assessed/performed       Past Medical History:  Diagnosis Date  . ADD (attention deficit disorder)   . Anemia   . Anginal pain ([place])    pt states has occas chest pain relates to indigestion; pt uses rest to relieve   . Anxiety   . Arthritis   . Concussion   . Depression   . Diabetes mellitus without complication ([place])   . Dizziness   . Fall   . GERD (gastroesophageal reflux disease)   . Headache   . History of urinary tract infection   . Hyperlipidemia   . Hypertension   . Hypothyroidism   . IBS (irritable bowel syndrome)   . Imbalance   . Numbness    right leg   . Numbness in both hands    comes and goes   . Pneumonia    last episode approx 1 year ago   . Sleep apnea   . Wears glasses     Past Surgical History:  Procedure Laterality Date  . BREAST CYST ASPIRATION Left 1980's   neg  . BREAST CYST EXCISION Right 1980's   neg  . BREAST LUMPECTOMY Right   . BREAST SURGERY    . CARPAL TUNNEL RELEASE    . CESAREAN SECTION     times 2  . COLONOSCOPY WITH PROPOFOL N/A 05/11/2015   Procedure: COLONOSCOPY WITH PROPOFOL;  Surgeon: Manya Silvas, MD;  Location[facility] ENDOSCOPY;  Service: Endoscopy;   Laterality: N/A;  . DE QUERVAIN'S RELEASE Right   . ESOPHAGOGASTRODUODENOSCOPY (EGD) WITH PROPOFOL N/A 05/11/2015   Procedure: ESOPHAGOGASTRODUODENOSCOPY (EGD) WITH PROPOFOL;  Surgeon: Manya Silvas, MD;  Location: [facility] ENDOSCOPY;  Service: Endoscopy;  Laterality: N/A;  . EYE SURGERY     laser surgery bilat   . HERNIA REPAIR    . KNEE ARTHROSCOPY    . LUMBAR LAMINECTOMY/DECOMPRESSION MICRODISCECTOMY Bilateral 09/14/2015   Procedure: MICRO LUMBAR BILATERAL DECOMPRESSION L4 - L5;  Surgeon: Susa Day, MD;  Location: [facility] ORS;  Service: Orthopedics;  Laterality: Bilateral;  . REDUCTION MAMMAPLASTY Bilateral 1980  . RHINOPLASTY    . TONSILLECTOMY    . TOTAL KNEE ARTHROPLASTY Left 06/18/2016   Procedure: LEFT TOTAL KNEE ARTHROPLASTY;  Surgeon: Paralee Cancel, MD;  Location: [facility] ORS;  Service: Orthopedics;  Laterality: Left;  Adductor Block  . TUBAL LIGATION    . UVULOPALATOPHARYNGOPLASTY      There were no vitals filed for this visit.  Subjective Assessment - 01/09/18 1045    Subjective  Back and knee are not doing well. Feeling dizzy and  losing balance. Has not gotten the result from the vascular test for her neck.  0/10 back pain currently, 3-4/10 back pain when walking.  Feels a dull ache in her back currently. L knee is sore. No L knee pain in sitting. Moving it hurts.  2/10 L knee when walking.     Pertinent History  Low back pain. Pt states having back surgery about 2-3 years ago. After she had her L TKA, her back started aggravating her due to her walking. Pt also fell about 4 weeks ago onto her L knee. Dr. Alvan Dame checked out her L knee. L knee was fine but has some inflammation.  Pt tries to keep it iced. Pt still recovering for her L TKA but the fall set her back.   Her L knee surgery was last year.  L knee still bothers her a lot.  Had surgery for her back before her L knee surgery. Was doing well until her L knee surgery. The fall made it worse. Feels debilitating. Also has a hard time  getting into and out of the car mainly due to her L knee.   Pt fell due to her L knee bucking on her. Pt was trying to pick something up from the side of the couch.  Also has a hard time getting up from the floor.  Pt states having bladder accidents since after her fall (pt was recommended to tell her MD about it).  Bowel problems since taking medications (was constapated, but after taking medications, pt had diarrhea which was difficult to control. Getting out of the metformin fixed the bowel issue).  Pt states tingling and numbness L lateral LE along the L5 dermatome.  Denies saddle anesthesia.  Pt states that her back problem is mainly on her L side.  Pt landed on her L knee when she fell.  No other falls within the last 6 months. Pt also states being very dizzy since her L knee surgery.  Had PT treatement for her dizziness before which did not help.  The room feels like it is spinning when she gets dizzy.  I feel like it is getting better then she does something.     Patient Stated Goals  Be better able to get into and out of her vehicle (midsized truck), into and out of chairs, be better able to roll in her bed with less L knee pain. Be able to get down on the floor and get back up.     Currently in Pain?  Yes    Pain Score  4     Pain Onset  More than a month ago                               PT Education - 01/09/18 1107    Education Details  ther-ex, HEP    Person(s) Educated  Patient    Methods  Explanation;Demonstration;Tactile cues;Verbal cues;Handout    Comprehension  Returned demonstration;Verbalized understanding        Objectives  MedbridgeAccess Code: [email or web address]  Latex glove allergies   Manual therapy   Self manual medial patellar glide. Decreased L knee pain with gait  Reviewed and given as part of her HEP. Pt demonstrated and verbalized understanding. Handout provided.   Ther-ex   Standing leg press resisting double blue band   L 10x3  R  10x3  Standing B shoulder extension resisting red band 10x3 with scapular retraction  Standing straight pallof press resisting double red band 10x5 seconds for 2 sets   Then in sitting 10x5 seconds  Standing L hip abduction abduction with B UE assist 10x3 to promote glute med muscle strengthening    Seated manually resisted trunk flexion 10x5 seconds. L low back discomfort today  Seated manually resisted shoulder extension isometrics 10x5 seconds for 2 sets  Unable to access information on neck blood vessel imaging.  Pt was recommended to use her rw for now for safety due to some unsteadiness with gait.     Improved exercise technique, movement at target joints, use of target muscles after mod verbal, visual, tactile cues.   Decreased L knee discomfort with medial patellar glide. Pt eucation on self mob to help with L knee pain. Continued working on trunk and glute strengthening as well as thoracic extension to help decrease pressure to low back. Pt tolerated session well without aggravation of symptoms.     PT Short Term Goals - 12/12/17 0930      PT SHORT TERM GOAL #1   Title  Patient will be independent with her HEP to help decrease back and L knee pain and improve ability to perform functional tasks.     Time  3    Period  Weeks    Status  Achieved    Target Date  12/12/17        PT Long Term Goals - 01/02/18 1507      PT LONG TERM GOAL #1   Title  Patient will have a decrease in back pain to 3/10 or less at worst to promote ability to ambulate, turn in bed, perform standing tasks with less pain.     Baseline  6/10 back pain at most (09/24/2017); 7-8/10 at most for the past 7 days, duration of pain is better since starting PT (10/29/2017); 5-6/10 at most for the past 7 days (11/18/2017); 8/10 at most for the past 7 days but the duration of pain is less compared to prior to starting PT (12/12/2017).  7/10 back pain at most for the past 7 days (pt states doing her exercises  helps decrease her pain; 01/02/2018)    Time  6    Period  Weeks    Status  On-going    Target Date  02/13/18      PT LONG TERM GOAL #2   Title  Patient will have a decrease in L knee pain to 3/10 or less at worst to promote ability to ambulate, negotiate stairs, get into and out of a car more comfortably.     Baseline  6/10 L knee pain at most for the past 3 months (09/24/2017); 7-8/10 at most for the past 7 days, duration of pain is better since starting PT (10/29/2017); 7/10 at most for the past 7 days (11/18/2017); 6/10 L knee pain at most for the past 7 days (12/12/2017); 5/10 L knee pain at most for the past 7 days (pt states doing her exercises helps decrease pain; 01/02/2018)    Time  6    Period  Weeks    Status  On-going    Target Date  02/13/18      PT LONG TERM GOAL #3   Title  Patient will improve TUG time to 12 seconds or less as a demonstration of improved functional mobility and balance.     Baseline  TUG no AD: 16.05 seconds on average (09/24/2017); 14.3 seconds on average (10/29/2017); 13 seconds average (11/21/2017); 16.67  seconds average (12/12/2017);  14.17 seconds average (01/02/2018)    Time  6    Period  Weeks    Status  Partially Met    Target Date  02/13/18      PT LONG TERM GOAL #4   Title  Patient will improve her back FOTO score by at least 10 points as a demonstration of improved funtion.      Baseline  Back FOTO: 33 (09/24/2017); 38 (10/29/2017); 36 (11/21/2017); 40 (12/12/2017); 42 (01/02/2018)    Time  6    Period  Weeks    Status  On-going    Target Date  02/13/18      PT LONG TERM GOAL #5   Title  Patient will improve bilateral LE strength by at least 1/2 MMT grade to promote ability to perform standing tasks.     Time  6    Period  Weeks    Status  Achieved    Target Date  01/02/18      PT LONG TERM GOAL #6   Title  Patient will improve her Modified Oswestry Low Back pain disablity questionnaire by at least 10% as a demonstration of improved function.      Baseline  48% (09/24/2017); 46% (10/29/2017), (11/21/2017); 56% (12/12/2017); 36% (01/02/2018)    Time  6    Period  Weeks    Status  Achieved    Target Date  01/02/18      PT LONG TERM GOAL #7   Title  Pt will report decreased difficulty stepping into and out of her bathtub as well as getting into and out of her truck to promote ability to get into and out of places.     Baseline  Pt reports difficulty stepping into and out of her bathtub as well as difficulty getting into and out of her truck (01/02/2018)    Time  6    Period  Weeks    Status  New    Target Date  02/13/18      PT LONG TERM GOAL #8   Title  Patient will improve her BERG balance test score to 46/56 or more as a demonstration of decreased fall risk     Baseline  42/56 (01/02/2018)    Time  6    Period  Weeks    Status  New    Target Date  02/13/18            Plan - 01/09/18 1110    Clinical Impression Statement  Decreased L knee discomfort with medial patellar glide. Pt eucation on self mob to help with L knee pain. Continued working on trunk and glute strengthening as well as thoracic extension to help decrease pressure to low back. Pt tolerated session well without aggravation of symptoms.     Rehab Potential  Fair    Clinical Impairments Affecting Rehab Potential  (-) chronicity of condition, multiple areas of pain, medical history, age; (+) motivated, husband support    PT Frequency  2x / week    PT Duration  6 weeks    PT Treatment/Interventions  Therapeutic activities;Therapeutic exercise;Balance training;Neuromuscular re-education;Patient/family education;Manual techniques;Dry needling;Aquatic Therapy;Electrical Stimulation;Iontophoresis 69m/ml Dexamethasone;Gait training    PT Next Visit Plan  hip and knee strengthening, core strengthening, patellar mobility, manual techniques, modalities PRN    Consulted and Agree with Plan of Care  Patient       Patient will benefit from skilled therapeutic intervention in  order to improve the following deficits and  impairments:  Pain, Improper body mechanics, Postural dysfunction, Dizziness, Decreased strength, Difficulty walking  Visit Diagnosis: Muscle weakness (generalized)  Chronic pain of left knee  Radiculopathy, lumbar region     Problem List Patient Active Problem List   Diagnosis Date Noted  . Obese 06/20/2016  . Status post right knee replacement 06/19/2016  . S/P left TKA 06/18/2016  . Spinal stenosis of lumbar region 09/14/2015     Joneen Boers PT, DPT  01/09/2018, 2:28 PM  [facility] [address] [place], [address] Phone: [phone]   Fax:  [phone]  Name: Destiny Shaw MRN: [id number] Date of Birth: 27-Sep-1950

## 2018-01-09 NOTE — Patient Instructions (Signed)
Self manual medial patellar glide. Decreased L knee pain with gait  Reviewed and given as part of her HEP. Pt demonstrated and verbalized understanding. Handout provided.

## 2018-01-10 ENCOUNTER — Encounter: Payer: Self-pay | Admitting: Speech Pathology
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
## 2018-01-14 ENCOUNTER — Ambulatory Visit: Payer: Medicare Other

## 2018-01-14 ENCOUNTER — Ambulatory Visit: Payer: Medicare Other | Admitting: Speech Pathology

## 2018-01-14 DIAGNOSIS — R41841 Cognitive communication deficit: Secondary | ICD-10-CM

## 2018-01-14 DIAGNOSIS — R42 Dizziness and giddiness: Secondary | ICD-10-CM | POA: Diagnosis not present

## 2018-01-15 ENCOUNTER — Ambulatory Visit: Payer: Medicare Other

## 2018-01-15 ENCOUNTER — Encounter: Payer: Self-pay | Admitting: Speech Pathology

## 2018-01-15 DIAGNOSIS — Z9181 History of falling: Secondary | ICD-10-CM

## 2018-01-15 DIAGNOSIS — R42 Dizziness and giddiness: Secondary | ICD-10-CM | POA: Diagnosis not present

## 2018-01-15 DIAGNOSIS — M6281 Muscle weakness (generalized): Secondary | ICD-10-CM

## 2018-01-15 DIAGNOSIS — M25562 Pain in left knee: Secondary | ICD-10-CM

## 2018-01-15 DIAGNOSIS — M5416 Radiculopathy, lumbar region: Secondary | ICD-10-CM

## 2018-01-15 DIAGNOSIS — G8929 Other chronic pain: Secondary | ICD-10-CM
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
## 2018-01-15 NOTE — Therapy (Signed)
[facility] [address] [place], [address] Phone: [phone]   Fax:  [phone]  Physical Therapy Treatment  Patient Details  Name: Destiny Shaw MRN: [id number] Date of Birth: 1950-11-30  Referring Provider (PT): Paralee Cancel, MD   Encounter Date: 01/15/2018  PT End of Session - 01/15/18 1034    Visit Number  24    Number of Visits  44    Date for PT Re-Evaluation  02/27/18    Authorization Type  6    Authorization Time Period  of 10 progress note Medicare    PT Start Time  1034    PT Stop Time  1124    PT Time Calculation (min)  50 min    Equipment Utilized During Treatment  Gait belt    Activity Tolerance  Patient tolerated treatment well    Behavior During Therapy  WFL for tasks assessed/performed       Past Medical History:  Diagnosis Date  . ADD (attention deficit disorder)   . Anemia   . Anginal pain ([place])    pt states has occas chest pain relates to indigestion; pt uses rest to relieve   . Anxiety   . Arthritis   . Concussion   . Depression   . Diabetes mellitus without complication ([place])   . Dizziness   . Fall   . GERD (gastroesophageal reflux disease)   . Headache   . History of urinary tract infection   . Hyperlipidemia   . Hypertension   . Hypothyroidism   . IBS (irritable bowel syndrome)   . Imbalance   . Numbness    right leg   . Numbness in both hands    comes and goes   . Pneumonia    last episode approx 1 year ago   . Sleep apnea   . Wears glasses     Past Surgical History:  Procedure Laterality Date  . BREAST CYST ASPIRATION Left 1980's   neg  . BREAST CYST EXCISION Right 1980's   neg  . BREAST LUMPECTOMY Right   . BREAST SURGERY    . CARPAL TUNNEL RELEASE    . CESAREAN SECTION     times 2  . COLONOSCOPY WITH PROPOFOL N/A 05/11/2015   Procedure: COLONOSCOPY WITH PROPOFOL;  Surgeon: Manya Silvas, MD;  Location: [facility] ENDOSCOPY;  Service: Endoscopy;   Laterality: N/A;  . DE QUERVAIN'S RELEASE Right   . ESOPHAGOGASTRODUODENOSCOPY (EGD) WITH PROPOFOL N/A 05/11/2015   Procedure: ESOPHAGOGASTRODUODENOSCOPY (EGD) WITH PROPOFOL;  Surgeon: Manya Silvas, MD;  Location: [facility] ENDOSCOPY;  Service: Endoscopy;  Laterality: N/A;  . EYE SURGERY     laser surgery bilat   . HERNIA REPAIR    . KNEE ARTHROSCOPY    . LUMBAR LAMINECTOMY/DECOMPRESSION MICRODISCECTOMY Bilateral 09/14/2015   Procedure: MICRO LUMBAR BILATERAL DECOMPRESSION L4 - L5;  Surgeon: Susa Day, MD;  Location: [facility] ORS;  Service: Orthopedics;  Laterality: Bilateral;  . REDUCTION MAMMAPLASTY Bilateral 1980  . RHINOPLASTY    . TONSILLECTOMY    . TOTAL KNEE ARTHROPLASTY Left 06/18/2016   Procedure: LEFT TOTAL KNEE ARTHROPLASTY;  Surgeon: Paralee Cancel, MD;  Location: [facility] ORS;  Service: Orthopedics;  Laterality: Left;  Adductor Block  . TUBAL LIGATION    . UVULOPALATOPHARYNGOPLASTY      There were no vitals filed for this visit.  Subjective Assessment - 01/15/18 1037    Subjective  Back is a little bit of a problem. L  knee is not so much because she has not been using it. Has been trying to do exercises for her back. 4/10 back pain currently. 5-6/10 back pain at most for the past 7 days. 4/10 L knee pain at most for the past 7 days.   Pt also states that she still has dizziness and would like to work on it to help it get better.     Pertinent History  Low back pain. Pt states having back surgery about 2-3 years ago. After she had her L TKA, her back started aggravating her due to her walking. Pt also fell about 4 weeks ago onto her L knee. Dr. Alvan Dame checked out her L knee. L knee was fine but has some inflammation.  Pt tries to keep it iced. Pt still recovering for her L TKA but the fall set her back.   Her L knee surgery was last year.  L knee still bothers her a lot.  Had surgery for her back before her L knee surgery. Was doing well until her L knee surgery. The fall made it worse. Feels  debilitating. Also has a hard time getting into and out of the car mainly due to her L knee.   Pt fell due to her L knee bucking on her. Pt was trying to pick something up from the side of the couch.  Also has a hard time getting up from the floor.  Pt states having bladder accidents since after her fall (pt was recommended to tell her MD about it).  Bowel problems since taking medications (was constapated, but after taking medications, pt had diarrhea which was difficult to control. Getting out of the metformin fixed the bowel issue).  Pt states tingling and numbness L lateral LE along the L5 dermatome.  Denies saddle anesthesia.  Pt states that her back problem is mainly on her L side.  Pt landed on her L knee when she fell.  No other falls within the last 6 months. Pt also states being very dizzy since her L knee surgery.  Had PT treatement for her dizziness before which did not help.  The room feels like it is spinning when she gets dizzy.  I feel like it is getting better then she does something.     Patient Stated Goals  Be better able to get into and out of her vehicle (midsized truck), into and out of chairs, be better able to roll in her bed with less L knee pain. Be able to get down on the floor and get back up.     Currently in Pain?  Yes    Pain Score  4     Pain Onset  More than a month ago                               PT Education - 01/15/18 1831    Education Details  vertigo and inner ear involvement    Person(s) Educated  Patient    Methods  Explanation;Demonstration;Handout    Comprehension  Verbalized understanding        MedbridgeAccess Code: [email or web address]  Latex glove allergies  Reviewed medial glide L patella  1:30 pm 01/20/2018 next appointment    Vestibular Assessment      HISTORY:   Subjective history of current problem:  Patient reports she saw Dr. Brigitte Pulse about 9 years ago and was told that she had crystals in her ears.  Patient reports  she had treatment in the office and she felt better afterwards. Patient reports she began to have dizziness several months ago. Patient reports she has vertigo, dizziness, and unsteadiness. Patient reports she has been having "lightness in the top part of my head and everything just moves around" Patient reports she has to hold the walls and grab onto things for balance when she is having episodes of dizziness. Patient reports she has had a few episodes where she feels like she is going to pass out, but she has not passed out. Patient reports about a month ago she was at a festival and had a sudden onset of vertigo and sensation of falling backwards. Patient's husband caught her and helped her back to her car and she felt unsteady for several days afterwards. Patient reports she had a difficult time lifting her legs and it felt like she was trying to walk uphill when it was a level surface during this episode. Patient reports her vision got black and she had tunnel vision. Patient states her vision "has not gotten to that degree since this time". Patient states that her vision gets dark at times. Patient reports she has not been to a ENT physician in regards to her symptoms.     Description of dizziness: vertigo, unsteadiness, lightheadedness, falling  Duration: minutes; patient reports vertigo lasts a few minutes except for one episode that lasts hours.     Symptom nature: motion provoked, positional, intermittent     Provocative Factors: looking up, bending over and coming back up, feels like she gets sensation behind her eyes at onset of dizziness, walking and turning quickly  Easing Factors: rest, sitting still     Progression of symptoms: better    Auditory complaints (tinnitus, pain, drainage): patient reports she has pain all the time around her eyes, forehead and in front of the ears but denies pain in the ears.  Vision (last eye exam, diplopia, recent changes): patient wears glasses     Cervical ROM:   Patient with [facility] cervical AROM flexion, extension and right and left rotation. Patient reports mild discomfort with left rotation and points to the left SCM muscle as source of the discomfort. Patient with decreased left cervical AROM to grossly 75 degrees.     OCULOMOTOR / VESTIBULAR TESTING:    Oculomotor Exam- Room Light    Normal  Abnormal  Comments   Ocular Alignment  N       Ocular ROM  N       Spontaneous Nystagmus  N       Gaze evoked Nystagmus  N       Smooth Pursuit    Abn  Patient with multiple saccades noted with testing and patient reports testing increases her dizziness   Saccades  N    Reports testing increases her dizziness   VOR      Deferred secondary to positive left Dix-hallpike test   VOR Cancellation    Abn  Patient reports blurring of target and dizziness   Left Head Impulse      Deferred secondary to positive left Dix-hallpike test   Right Head Impulse      Deferred secondary to positive left Dix-hallpike test   Head Shaking Nystagmus      Deferred secondary to positive left Dix-hallpike test           BPPV TESTS:    Symptoms  Duration  Intensity  Nystagmus   Left Dix-Hallpike  vertigo  None observed   Right Dix-Hallpike  None      None observed     The above vestibular assessment was performed by Lady Deutscher PT, DPT.      Pt continues to demonstrate dizziness and difficulty with balance. Pt tested positive for Dix-Hallpike for her L ear suggesting L inner ear involvement with her symptoms. Pt will benefit from continued skilled physical therapy services to address the aforementioned deficits. Canalith repositioning and vestibular treatment added to plan of care for recert to help treat patient for her dizziness. Pt will also benefit from continued skilled physical therapy services to help decrease back and L knee pain, improve strength and function.         PT Short Term Goals - 12/12/17 0930      PT SHORT TERM GOAL #1    Title  Patient will be independent with her HEP to help decrease back and L knee pain and improve ability to perform functional tasks.     Time  3    Period  Weeks    Status  Achieved    Target Date  12/12/17        PT Long Term Goals - 01/15/18 1833      PT LONG TERM GOAL #1   Title  Patient will have a decrease in back pain to 3/10 or less at worst to promote ability to ambulate, turn in bed, perform standing tasks with less pain.     Baseline  6/10 back pain at most (09/24/2017); 7-8/10 at most for the past 7 days, duration of pain is better since starting PT (10/29/2017); 5-6/10 at most for the past 7 days (11/18/2017); 8/10 at most for the past 7 days but the duration of pain is less compared to prior to starting PT (12/12/2017).  7/10 back pain at most for the past 7 days (pt states doing her exercises helps decrease her pain; 01/02/2018)    Time  6    Period  Weeks    Status  On-going    Target Date  02/27/18      PT LONG TERM GOAL #2   Title  Patient will have a decrease in L knee pain to 3/10 or less at worst to promote ability to ambulate, negotiate stairs, get into and out of a car more comfortably.     Baseline  6/10 L knee pain at most for the past 3 months (09/24/2017); 7-8/10 at most for the past 7 days, duration of pain is better since starting PT (10/29/2017); 7/10 at most for the past 7 days (11/18/2017); 6/10 L knee pain at most for the past 7 days (12/12/2017); 5/10 L knee pain at most for the past 7 days (pt states doing her exercises helps decrease pain; 01/02/2018)    Time  6    Period  Weeks    Status  On-going    Target Date  02/27/18      PT LONG TERM GOAL #3   Title  Patient will improve TUG time to 12 seconds or less as a demonstration of improved functional mobility and balance.     Baseline  TUG no AD: 16.05 seconds on average (09/24/2017); 14.3 seconds on average (10/29/2017); 13 seconds average (11/21/2017); 16.67 seconds average (12/12/2017);  14.17 seconds average  (01/02/2018)    Time  6    Period  Weeks    Status  Partially Met    Target Date  02/27/18      PT  LONG TERM GOAL #4   Title  Patient will improve her back FOTO score by at least 10 points as a demonstration of improved funtion.      Baseline  Back FOTO: 33 (09/24/2017); 38 (10/29/2017); 36 (11/21/2017); 40 (12/12/2017); 42 (01/02/2018)    Time  6    Period  Weeks    Status  On-going    Target Date  02/27/18      PT LONG TERM GOAL #5   Title  Patient will improve bilateral LE strength by at least 1/2 MMT grade to promote ability to perform standing tasks.     Time  6    Period  Weeks    Status  Achieved      Additional Long Term Goals   Additional Long Term Goals  Yes      PT LONG TERM GOAL #6   Title  Patient will improve her Modified Oswestry Low Back pain disablity questionnaire by at least 10% as a demonstration of improved function.     Baseline  48% (09/24/2017); 46% (10/29/2017), (11/21/2017); 56% (12/12/2017); 36% (01/02/2018)    Time  6    Period  Weeks    Status  Achieved      PT LONG TERM GOAL #7   Title  Pt will report decreased difficulty stepping into and out of her bathtub as well as getting into and out of her truck to promote ability to get into and out of places.     Baseline  Pt reports difficulty stepping into and out of her bathtub as well as difficulty getting into and out of her truck (01/02/2018)    Time  6    Period  Weeks    Status  On-going    Target Date  02/27/18      PT LONG TERM GOAL #8   Title  Patient will improve her BERG balance test score to 46/56 or more as a demonstration of decreased fall risk     Baseline  42/56 (01/02/2018)    Time  6    Period  Weeks    Status  On-going    Target Date  02/27/18      PT LONG TERM GOAL  #9   TITLE  Pt will report minimal to no dizziness related to vertigo to promote balance and decrease fall risk.     Baseline  Pt reports dizziness (related to vertigo) which seem to affect her balance (01/15/2018)    Time  6     Period  Weeks    Status  New    Target Date  02/27/18            Plan - 01/15/18 1102    Clinical Impression Statement  Pt continues to demonstrate dizziness and difficulty with balance. Pt tested positive for Dix-Hallpike for her L ear suggesting L inner ear involvement with her symptoms. Pt will benefit from continued skilled physical therapy services to address the aforementioned deficits. Canalith repositioning and vestibular treatment added to plan of care for recert to help treat patient for her dizziness. Pt will also benefit from continued skilled physical therapy services to help decrease back and L knee pain, improve strength and function.     History and Personal Factors relevant to plan of care:  Chronic low back pain, L LE radiating symptoms, L knee pain, hx of fall, medical history, LE weakness, difficulty getting into and out of the car, stair negotiation, bed mobility, walking, dizziness.  Clinical Presentation  Stable    Clinical Presentation due to:  Pt overall making some progress with PT towards goals     Clinical Decision Making  Low    Rehab Potential  Fair    Clinical Impairments Affecting Rehab Potential  (-) chronicity of condition, multiple areas of pain, medical history, age; (+) motivated, husband support    PT Frequency  2x / week    PT Duration  6 weeks    PT Treatment/Interventions  Therapeutic activities;Therapeutic exercise;Balance training;Neuromuscular re-education;Patient/family education;Manual techniques;Dry needling;Aquatic Therapy;Electrical Stimulation;Iontophoresis 24m/ml Dexamethasone;Gait training;Canalith Repostioning;Vestibular    PT Next Visit Plan  canalith repositioning, hip and knee strengthening, core strengthening, patellar mobility, manual techniques, modalities PRN    Consulted and Agree with Plan of Care  Patient       Patient will benefit from skilled therapeutic intervention in order to improve the following deficits and  impairments:  Pain, Improper body mechanics, Postural dysfunction, Dizziness, Decreased strength, Difficulty walking, Decreased balance  Visit Diagnosis: Dizziness and giddiness - Plan: PT plan of care cert/re-cert  History of falling - Plan: PT plan of care cert/re-cert  Radiculopathy, lumbar region - Plan: PT plan of care cert/re-cert  Chronic pain of left knee - Plan: PT plan of care cert/re-cert  Muscle weakness (generalized) - Plan: PT plan of care cert/re-cert     Problem List Patient Active Problem List   Diagnosis Date Noted  . Obese 06/20/2016  . Status post right knee replacement 06/19/2016  . S/P left TKA 06/18/2016  . Spinal stenosis of lumbar region 09/14/2015    Thank you for your referral.  MJoneen BoersPT, DPT   01/15/2018, 6:51 PM  [facility] [address] [place] [address]Phone: [phone]  Fax:  [phone] Name: Destiny AGUONMRN: [id number] of Birth: 4Jul 31, 1952

## 2018-01-17 ENCOUNTER — Ambulatory Visit: Payer: Medicare Other | Admitting: Speech Pathology

## 2018-01-20 ENCOUNTER — Ambulatory Visit: Payer: Medicare Other

## 2018-01-20 DIAGNOSIS — Z9181 History of falling: Secondary | ICD-10-CM

## 2018-01-20 DIAGNOSIS — R42 Dizziness and giddiness: Secondary | ICD-10-CM

## 2018-01-20 NOTE — Therapy (Addendum)
[facility] [address] [place], [address] Phone: [phone]   Fax:  [phone]  Physical Therapy Treatment  Patient Details  Name: Destiny Shaw MRN: [id number] Date of Birth: 05-Nov-1950 Referring Provider (PT): Paralee Cancel, MD   Encounter Date: 01/20/2018  PT End of Session - 01/20/18 1339    Visit Number  25    Number of Visits  44    Date for PT Re-Evaluation  02/27/18    Authorization Type  7    Authorization Time Period  of 10 progress note Medicare    PT Start Time  7939    PT Stop Time  1423    PT Time Calculation (min)  44 min    Activity Tolerance  Patient tolerated treatment well    Behavior During Therapy  [facility] for tasks assessed/performed       Past Medical History:  Diagnosis Date  . ADD (attention deficit disorder)   . Anemia   . Anginal pain ([place])    pt states has occas chest pain relates to indigestion; pt uses rest to relieve   . Anxiety   . Arthritis   . Concussion   . Depression   . Diabetes mellitus without complication ([place])   . Dizziness   . Fall   . GERD (gastroesophageal reflux disease)   . Headache   . History of urinary tract infection   . Hyperlipidemia   . Hypertension   . Hypothyroidism   . IBS (irritable bowel syndrome)   . Imbalance   . Numbness    right leg   . Numbness in both hands    comes and goes   . Pneumonia    last episode approx 1 year ago   . Sleep apnea   . Wears glasses     Past Surgical History:  Procedure Laterality Date  . BREAST CYST ASPIRATION Left 1980's   neg  . BREAST CYST EXCISION Right 1980's   neg  . BREAST LUMPECTOMY Right   . BREAST SURGERY    . CARPAL TUNNEL RELEASE    . CESAREAN SECTION     times 2  . COLONOSCOPY WITH PROPOFOL N/A 05/11/2015   Procedure: COLONOSCOPY WITH PROPOFOL;  Surgeon: Manya Silvas, MD;  Location: [facility] ENDOSCOPY;  Service: Endoscopy;  Laterality: N/A;  . DE QUERVAIN'S RELEASE Right   .  ESOPHAGOGASTRODUODENOSCOPY (EGD) WITH PROPOFOL N/A 05/11/2015   Procedure: ESOPHAGOGASTRODUODENOSCOPY (EGD) WITH PROPOFOL;  Surgeon: Manya Silvas, MD;  Location: [facility] ENDOSCOPY;  Service: Endoscopy;  Laterality: N/A;  . EYE SURGERY     laser surgery bilat   . HERNIA REPAIR    . KNEE ARTHROSCOPY    . LUMBAR LAMINECTOMY/DECOMPRESSION MICRODISCECTOMY Bilateral 09/14/2015   Procedure: MICRO LUMBAR BILATERAL DECOMPRESSION L4 - L5;  Surgeon: Susa Day, MD;  Location: [facility] ORS;  Service: Orthopedics;  Laterality: Bilateral;  . REDUCTION MAMMAPLASTY Bilateral 1980  . RHINOPLASTY    . TONSILLECTOMY    . TOTAL KNEE ARTHROPLASTY Left 06/18/2016   Procedure: LEFT TOTAL KNEE ARTHROPLASTY;  Surgeon: Paralee Cancel, MD;  Location: [facility] ORS;  Service: Orthopedics;  Laterality: Left;  Adductor Block  . TUBAL LIGATION    . UVULOPALATOPHARYNGOPLASTY      There were no vitals filed for this visit.  Subjective Assessment - 01/20/18 1338    Subjective  Patient reports that she her dizziness symptoms are a little better than they were last week. Patient states that after  the canalith repositioning maneuver she feels a little "floating" sensation but states it is mild and she does not feel like she has to grip the mat table for support.    Pertinent History  Low back pain. Pt states having back surgery about 2-3 years ago. After she had her L TKA, her back started aggravating her due to her walking. Pt also fell about 4 weeks ago onto her L knee. Dr. Alvan Dame checked out her L knee. L knee was fine but has some inflammation.  Pt tries to keep it iced. Pt still recovering for her L TKA but the fall set her back.   Her L knee surgery was last year.  L knee still bothers her a lot.  Had surgery for her back before her L knee surgery. Was doing well until her L knee surgery. The fall made it worse. Feels debilitating. Also has a hard time getting into and out of the car mainly due to her L knee.   Pt fell due to her L knee  bucking on her. Pt was trying to pick something up from the side of the couch.  Also has a hard time getting up from the floor.  Pt states having bladder accidents since after her fall (pt was recommended to tell her MD about it).  Bowel problems since taking medications (was constapated, but after taking medications, pt had diarrhea which was difficult to control. Getting out of the metformin fixed the bowel issue).  Pt states tingling and numbness L lateral LE along the L5 dermatome.  Denies saddle anesthesia.  Pt states that her back problem is mainly on her L side.  Pt landed on her L knee when she fell.  No other falls within the last 6 months. Pt also states being very dizzy since her L knee surgery.  Had PT treatement for her dizziness before which did not help.  The room feels like it is spinning when she gets dizzy.  I feel like it is getting better then she does something.     Patient Stated Goals  Be better able to get into and out of her vehicle (midsized truck), into and out of chairs, be better able to roll in her bed with less L knee pain. Be able to get down on the floor and get back up.     Currently in Pain?  Yes    Pain Score  5    not constent but states it is when she moves certain ways   Pain Location  Back    Pain Onset  More than a month ago        Canalith Repositioning Manuever:  On mat table performed left Dix-Hallpike testand it was positive with left upbeating, torsional nystagmus of short duration. Patient with few second latency and then observed multiple beats of nystagmus. Performed left canalith repositioning maneuver (CRT). Repeated left CRT for a total of 3 maneuvers with retesting between maneuvers. Patient reported 4/10 vertigo with first maneuver and 0/10 with third maneuver and no nystagmus was observed during 3rd maneuver. Patient reports that she misplaced her handout from last session and therefore, reprinted and gave patient BPPV handout from  [email or web address]. Patient expressed interest in learning how to perform canalith repositioning maneuver at home if her symptoms return.     PT Education - 01/20/18 1339    Education Details  BPPV and canalith repositioning manuever; issued BPPV handout from [email or web address]    Person(s) Educated  Patient  Methods  Explanation;Handout;Verbal cues    Comprehension  Verbalized understanding       PT Short Term Goals - 12/12/17 0930      PT SHORT TERM GOAL #1   Title  Patient will be independent with her HEP to help decrease back and L knee pain and improve ability to perform functional tasks.     Time  3    Period  Weeks    Status  Achieved    Target Date  12/12/17        PT Long Term Goals - 01/15/18 1833      PT LONG TERM GOAL #1   Title  Patient will have a decrease in back pain to 3/10 or less at worst to promote ability to ambulate, turn in bed, perform standing tasks with less pain.     Baseline  6/10 back pain at most (09/24/2017); 7-8/10 at most for the past 7 days, duration of pain is better since starting PT (10/29/2017); 5-6/10 at most for the past 7 days (11/18/2017); 8/10 at most for the past 7 days but the duration of pain is less compared to prior to starting PT (12/12/2017).  7/10 back pain at most for the past 7 days (pt states doing her exercises helps decrease her pain; 01/02/2018)    Time  6    Period  Weeks    Status  On-going    Target Date  02/27/18      PT LONG TERM GOAL #2   Title  Patient will have a decrease in L knee pain to 3/10 or less at worst to promote ability to ambulate, negotiate stairs, get into and out of a car more comfortably.     Baseline  6/10 L knee pain at most for the past 3 months (09/24/2017); 7-8/10 at most for the past 7 days, duration of pain is better since starting PT (10/29/2017); 7/10 at most for the past 7 days (11/18/2017); 6/10 L knee pain at most for the past 7 days (12/12/2017); 5/10 L knee pain at most for the past 7 days (pt states  doing her exercises helps decrease pain; 01/02/2018)    Time  6    Period  Weeks    Status  On-going    Target Date  02/27/18      PT LONG TERM GOAL #3   Title  Patient will improve TUG time to 12 seconds or less as a demonstration of improved functional mobility and balance.     Baseline  TUG no AD: 16.05 seconds on average (09/24/2017); 14.3 seconds on average (10/29/2017); 13 seconds average (11/21/2017); 16.67 seconds average (12/12/2017);  14.17 seconds average (01/02/2018)    Time  6    Period  Weeks    Status  Partially Met    Target Date  02/27/18      PT LONG TERM GOAL #4   Title  Patient will improve her back FOTO score by at least 10 points as a demonstration of improved funtion.      Baseline  Back FOTO: 33 (09/24/2017); 38 (10/29/2017); 36 (11/21/2017); 40 (12/12/2017); 42 (01/02/2018)    Time  6    Period  Weeks    Status  On-going    Target Date  02/27/18      PT LONG TERM GOAL #5   Title  Patient will improve bilateral LE strength by at least 1/2 MMT grade to promote ability to perform standing tasks.     Time  6    Period  Weeks    Status  Achieved      Additional Long Term Goals   Additional Long Term Goals  Yes      PT LONG TERM GOAL #6   Title  Patient will improve her Modified Oswestry Low Back pain disablity questionnaire by at least 10% as a demonstration of improved function.     Baseline  48% (09/24/2017); 46% (10/29/2017), (11/21/2017); 56% (12/12/2017); 36% (01/02/2018)    Time  6    Period  Weeks    Status  Achieved      PT LONG TERM GOAL #7   Title  Pt will report decreased difficulty stepping into and out of her bathtub as well as getting into and out of her truck to promote ability to get into and out of places.     Baseline  Pt reports difficulty stepping into and out of her bathtub as well as difficulty getting into and out of her truck (01/02/2018)    Time  6    Period  Weeks    Status  On-going    Target Date  02/27/18      PT LONG TERM GOAL #8   Title   Patient will improve her BERG balance test score to 46/56 or more as a demonstration of decreased fall risk     Baseline  42/56 (01/02/2018)    Time  6    Period  Weeks    Status  On-going    Target Date  02/27/18      PT LONG TERM GOAL  #9   TITLE  Pt will report minimal to no dizziness related to vertigo to promote balance and decrease fall risk.     Baseline  Pt reports dizziness (related to vertigo) which seem to affect her balance (01/15/2018)    Time  6    Period  Weeks    Status  New    Target Date  02/27/18            Plan - 01/20/18 1340    Clinical Impression Statement  Patient with positive left Dix-Hallpike test with nystagmus noted and patient reporting 4/10 vertigo. Patient able to tolerate canalith repositioning manuevers this date and reported no vertigo and no nystagmus noted with the 3rd maneuver. Will plan on retesting Dix-hallpike tests next session and performing canalith repositioning manuever if indicated. Once BPPV is cleared, will teach patient self-Epley in case of reoccurance. Patient will benefit from continued PT services to further address her goals as set on plan of care and to reduce patient's dizziness and reduce her falls risk.    Rehab Potential  Fair    Clinical Impairments Affecting Rehab Potential  (-) chronicity of condition, multiple areas of pain, medical history, age; (+) motivated, husband support    PT Frequency  2x / week    PT Duration  6 weeks    PT Treatment/Interventions  Therapeutic activities;Therapeutic exercise;Balance training;Neuromuscular re-education;Patient/family education;Manual techniques;Dry needling;Aquatic Therapy;Electrical Stimulation;Iontophoresis 43m/ml Dexamethasone;Gait training;Canalith Repostioning;Vestibular    PT Next Visit Plan  canalith repositioning, hip and knee strengthening, core strengthening, patellar mobility, manual techniques, modalities PRN    Consulted and Agree with Plan of Care  Patient        Patient will benefit from skilled therapeutic intervention in order to improve the following deficits and impairments:  Pain, Improper body mechanics, Postural dysfunction, Dizziness, Decreased strength, Difficulty walking, Decreased balance  Visit Diagnosis: Dizziness and giddiness  History of  falling     Problem List Patient Active Problem List   Diagnosis Date Noted  . Obese 06/20/2016  . Status post right knee replacement 06/19/2016  . S/P left TKA 06/18/2016  . Spinal stenosis of lumbar region 09/14/2015   Lady Deutscher PT, DPT [phone]  Murphy,Dorriea 01/20/2018, 2:45 PM    Joneen Boers PT, DPT    [facility] [address], [place], [address] Phone: [phone]   Fax:  [phone]  Name: Destiny Shaw MRN: [id number] Date of Birth: 01-May-1950

## 2018-01-21 ENCOUNTER — Ambulatory Visit: Payer: Medicare Other

## 2018-01-21 DIAGNOSIS — M5416 Radiculopathy, lumbar region: Secondary | ICD-10-CM

## 2018-01-21 DIAGNOSIS — M6281 Muscle weakness (generalized): Secondary | ICD-10-CM

## 2018-01-21 DIAGNOSIS — R42 Dizziness and giddiness: Secondary | ICD-10-CM | POA: Diagnosis not present

## 2018-01-21 NOTE — Therapy (Signed)
[facility] [address] [place], [address] Phone: [phone]   Fax:  [phone]  Physical Therapy Treatment  Patient Details  Name: Destiny Shaw MRN: [id number] Date of Birth: 1951-01-17 Referring Provider (PT): Paralee Cancel, MD   Encounter Date: 01/21/2018  PT End of Session - 01/21/18 1653    Visit Number  26    Number of Visits  44    Date for PT Re-Evaluation  02/27/18    Authorization Type  8    Authorization Time Period  of 10 progress note Medicare    PT Start Time  5498    PT Stop Time  2641    PT Time Calculation (min)  40 min    Activity Tolerance  Patient tolerated treatment well    Behavior During Therapy  [facility] for tasks assessed/performed       Past Medical History:  Diagnosis Date  . ADD (attention deficit disorder)   . Anemia   . Anginal pain ([place])    pt states has occas chest pain relates to indigestion; pt uses rest to relieve   . Anxiety   . Arthritis   . Concussion   . Depression   . Diabetes mellitus without complication ([place])   . Dizziness   . Fall   . GERD (gastroesophageal reflux disease)   . Headache   . History of urinary tract infection   . Hyperlipidemia   . Hypertension   . Hypothyroidism   . IBS (irritable bowel syndrome)   . Imbalance   . Numbness    right leg   . Numbness in both hands    comes and goes   . Pneumonia    last episode approx 1 year ago   . Sleep apnea   . Wears glasses     Past Surgical History:  Procedure Laterality Date  . BREAST CYST ASPIRATION Left 1980's   neg  . BREAST CYST EXCISION Right 1980's   neg  . BREAST LUMPECTOMY Right   . BREAST SURGERY    . CARPAL TUNNEL RELEASE    . CESAREAN SECTION     times 2  . COLONOSCOPY WITH PROPOFOL N/A 05/11/2015   Procedure: COLONOSCOPY WITH PROPOFOL;  Surgeon: Manya Silvas, MD;  Location: [facility] ENDOSCOPY;  Service: Endoscopy;  Laterality: N/A;  . DE QUERVAIN'S RELEASE Right   .  ESOPHAGOGASTRODUODENOSCOPY (EGD) WITH PROPOFOL N/A 05/11/2015   Procedure: ESOPHAGOGASTRODUODENOSCOPY (EGD) WITH PROPOFOL;  Surgeon: Manya Silvas, MD;  Location[facility] ENDOSCOPY;  Service: Endoscopy;  Laterality: N/A;  . EYE SURGERY     laser surgery bilat   . HERNIA REPAIR    . KNEE ARTHROSCOPY    . LUMBAR LAMINECTOMY/DECOMPRESSION MICRODISCECTOMY Bilateral 09/14/2015   Procedure: MICRO LUMBAR BILATERAL DECOMPRESSION L4 - L5;  Surgeon: Susa Day, MD;  Location: [facility] ORS;  Service: Orthopedics;  Laterality: Bilateral;  . REDUCTION MAMMAPLASTY Bilateral 1980  . RHINOPLASTY    . TONSILLECTOMY    . TOTAL KNEE ARTHROPLASTY Left 06/18/2016   Procedure: LEFT TOTAL KNEE ARTHROPLASTY;  Surgeon: Paralee Cancel, MD;  Location: [facility] ORS;  Service: Orthopedics;  Laterality: Left;  Adductor Block  . TUBAL LIGATION    . UVULOPALATOPHARYNGOPLASTY      There were no vitals filed for this visit.  Subjective Assessment - 01/21/18 1655    Subjective  Dizziness improved a little bit. Back is acting up. Started giving her problems. Better now but still gets into spasm  easily.   Does not know if she has been doing her naval in exercises.  5/10 usual during the day for her back, 2/10 back pain when sitting still.  Turning to pick something up, or turning while walking causes spams in her back. L knee is doing well today.     Pertinent History  Low back pain. Pt states having back surgery about 2-3 years ago. After she had her L TKA, her back started aggravating her due to her walking. Pt also fell about 4 weeks ago onto her L knee. Dr. Alvan Dame checked out her L knee. L knee was fine but has some inflammation.  Pt tries to keep it iced. Pt still recovering for her L TKA but the fall set her back.   Her L knee surgery was last year.  L knee still bothers her a lot.  Had surgery for her back before her L knee surgery. Was doing well until her L knee surgery. The fall made it worse. Feels debilitating. Also has a hard time  getting into and out of the car mainly due to her L knee.   Pt fell due to her L knee bucking on her. Pt was trying to pick something up from the side of the couch.  Also has a hard time getting up from the floor.  Pt states having bladder accidents since after her fall (pt was recommended to tell her MD about it).  Bowel problems since taking medications (was constapated, but after taking medications, pt had diarrhea which was difficult to control. Getting out of the metformin fixed the bowel issue).  Pt states tingling and numbness L lateral LE along the L5 dermatome.  Denies saddle anesthesia.  Pt states that her back problem is mainly on her L side.  Pt landed on her L knee when she fell.  No other falls within the last 6 months. Pt also states being very dizzy since her L knee surgery.  Had PT treatement for her dizziness before which did not help.  The room feels like it is spinning when she gets dizzy.  I feel like it is getting better then she does something.     Patient Stated Goals  Be better able to get into and out of her vehicle (midsized truck), into and out of chairs, be better able to roll in her bed with less L knee pain. Be able to get down on the floor and get back up.     Currently in Pain?  Yes    Pain Score  2     Pain Onset  More than a month ago                               PT Education - 01/21/18 1719    Education Details  ther-ex, HEP    Person(s) Educated  Patient    Methods  Explanation;Demonstration;Tactile cues;Verbal cues;Handout    Comprehension  Returned demonstration;Verbalized understanding      Objectives   Medbridge Access Code: [email or web address]   Thursday 01/30/18 working on dizziness.     Therapeutic exercise  Progress report next visit  Seated transversus abdominis contraction 10x10 seconds  Supine lower trunk rotation 10x3 each side. Decreased low back stiffness   Reviewed and given as part of her HEP. Pt demonstrated and  verbalized understanding. Handout provided.   Supine hip fallouts 10x2 each LE   Seated hip hinging with transversus  10x2. No complain of pain with leaning forward with trunk muscle activation and hip hinging.   Standing straight pallof press resisting yellow band 10x5 seconds for 3 sets  No back pain after performing aforementioned exercises.   Standing hip abduction L LE resisting 2 lbs  With B UE assist 10x    Improved exercise technique, movement at target joints, use of target muscles after mod verbal, visual, tactile cues.   Decreased low back stiffness with lower trunk rotation exercise. Difficulty with pelvic control with supine hip fallouts, resulting in ipsilateral pelvic rotation during ipsilateral hip fallout. Decreased back pain following exercise to promote low back mobility followed by trunk muscle activation, as well as lumbopelvic stability and control.      PT Short Term Goals - 12/12/17 0930      PT SHORT TERM GOAL #1   Title  Patient will be independent with her HEP to help decrease back and L knee pain and improve ability to perform functional tasks.     Time  3    Period  Weeks    Status  Achieved    Target Date  12/12/17        PT Long Term Goals - 01/15/18 1833      PT LONG TERM GOAL #1   Title  Patient will have a decrease in back pain to 3/10 or less at worst to promote ability to ambulate, turn in bed, perform standing tasks with less pain.     Baseline  6/10 back pain at most (09/24/2017); 7-8/10 at most for the past 7 days, duration of pain is better since starting PT (10/29/2017); 5-6/10 at most for the past 7 days (11/18/2017); 8/10 at most for the past 7 days but the duration of pain is less compared to prior to starting PT (12/12/2017).  7/10 back pain at most for the past 7 days (pt states doing her exercises helps decrease her pain; 01/02/2018)    Time  6    Period  Weeks    Status  On-going    Target Date  02/27/18      PT LONG TERM GOAL #2    Title  Patient will have a decrease in L knee pain to 3/10 or less at worst to promote ability to ambulate, negotiate stairs, get into and out of a car more comfortably.     Baseline  6/10 L knee pain at most for the past 3 months (09/24/2017); 7-8/10 at most for the past 7 days, duration of pain is better since starting PT (10/29/2017); 7/10 at most for the past 7 days (11/18/2017); 6/10 L knee pain at most for the past 7 days (12/12/2017); 5/10 L knee pain at most for the past 7 days (pt states doing her exercises helps decrease pain; 01/02/2018)    Time  6    Period  Weeks    Status  On-going    Target Date  02/27/18      PT LONG TERM GOAL #3   Title  Patient will improve TUG time to 12 seconds or less as a demonstration of improved functional mobility and balance.     Baseline  TUG no AD: 16.05 seconds on average (09/24/2017); 14.3 seconds on average (10/29/2017); 13 seconds average (11/21/2017); 16.67 seconds average (12/12/2017);  14.17 seconds average (01/02/2018)    Time  6    Period  Weeks    Status  Partially Met    Target Date  02/27/18  PT LONG TERM GOAL #4   Title  Patient will improve her back FOTO score by at least 10 points as a demonstration of improved funtion.      Baseline  Back FOTO: 33 (09/24/2017); 38 (10/29/2017); 36 (11/21/2017); 40 (12/12/2017); 42 (01/02/2018)    Time  6    Period  Weeks    Status  On-going    Target Date  02/27/18      PT LONG TERM GOAL #5   Title  Patient will improve bilateral LE strength by at least 1/2 MMT grade to promote ability to perform standing tasks.     Time  6    Period  Weeks    Status  Achieved      Additional Long Term Goals   Additional Long Term Goals  Yes      PT LONG TERM GOAL #6   Title  Patient will improve her Modified Oswestry Low Back pain disablity questionnaire by at least 10% as a demonstration of improved function.     Baseline  48% (09/24/2017); 46% (10/29/2017), (11/21/2017); 56% (12/12/2017); 36% (01/02/2018)    Time  6     Period  Weeks    Status  Achieved      PT LONG TERM GOAL #7   Title  Pt will report decreased difficulty stepping into and out of her bathtub as well as getting into and out of her truck to promote ability to get into and out of places.     Baseline  Pt reports difficulty stepping into and out of her bathtub as well as difficulty getting into and out of her truck (01/02/2018)    Time  6    Period  Weeks    Status  On-going    Target Date  02/27/18      PT LONG TERM GOAL #8   Title  Patient will improve her BERG balance test score to 46/56 or more as a demonstration of decreased fall risk     Baseline  42/56 (01/02/2018)    Time  6    Period  Weeks    Status  On-going    Target Date  02/27/18      PT LONG TERM GOAL  #9   TITLE  Pt will report minimal to no dizziness related to vertigo to promote balance and decrease fall risk.     Baseline  Pt reports dizziness (related to vertigo) which seem to affect her balance (01/15/2018)    Time  6    Period  Weeks    Status  New    Target Date  02/27/18            Plan - 01/21/18 1722    Clinical Impression Statement  Decreased low back stiffness with lower trunk rotation exercise. Difficulty with pelvic control with supine hip fallouts, resulting in ipsilateral pelvic rotation during ipsilateral hip fallout. Decreased back pain following exercise to promote low back mobility followed by trunk muscle activation, as well as lumbopelvic stability and control.     Rehab Potential  Fair    Clinical Impairments Affecting Rehab Potential  (-) chronicity of condition, multiple areas of pain, medical history, age; (+) motivated, husband support    PT Frequency  2x / week    PT Duration  6 weeks    PT Treatment/Interventions  Therapeutic activities;Therapeutic exercise;Balance training;Neuromuscular re-education;Patient/family education;Manual techniques;Dry needling;Aquatic Therapy;Electrical Stimulation;Iontophoresis 68m/ml Dexamethasone;Gait  training;Canalith Repostioning;Vestibular    PT Next Visit Plan  canalith repositioning,  hip and knee strengthening, core strengthening, patellar mobility, manual techniques, modalities PRN    Consulted and Agree with Plan of Care  Patient       Patient will benefit from skilled therapeutic intervention in order to improve the following deficits and impairments:  Pain, Improper body mechanics, Postural dysfunction, Dizziness, Decreased strength, Difficulty walking, Decreased balance  Visit Diagnosis: Radiculopathy, lumbar region  Muscle weakness (generalized)     Problem List Patient Active Problem List   Diagnosis Date Noted  . Obese 06/20/2016  . Status post right knee replacement 06/19/2016  . S/P left TKA 06/18/2016  . Spinal stenosis of lumbar region 09/14/2015    Joneen Boers PT, DPT   01/21/2018, 9:01 PM  [facility] [address], [place], [address] Phone: [phone]   Fax:  [phone]  Name: Destiny Shaw MRN: [id number] Date of Birth: December 06, 1950

## 2018-01-21 NOTE — Patient Instructions (Signed)
Medbridge Access Code: [email or web address]    Lower Trunk Rotations 10x3 each side

## 2018-01-27 ENCOUNTER — Ambulatory Visit: Payer: Medicare Other

## 2018-01-29 ENCOUNTER — Ambulatory Visit: Payer: Medicare Other

## 2018-01-30 ENCOUNTER — Telehealth: Payer: Self-pay

## 2018-01-30 ENCOUNTER — Ambulatory Visit: Payer: Medicare Other | Attending: Orthopedic Surgery

## 2018-01-30 DIAGNOSIS — M6281 Muscle weakness (generalized): Secondary | ICD-10-CM | POA: Insufficient documentation

## 2018-01-30 DIAGNOSIS — G8929 Other chronic pain: Secondary | ICD-10-CM | POA: Insufficient documentation

## 2018-01-30 DIAGNOSIS — Z9181 History of falling: Secondary | ICD-10-CM | POA: Insufficient documentation

## 2018-01-30 DIAGNOSIS — M25562 Pain in left knee: Secondary | ICD-10-CM | POA: Insufficient documentation

## 2018-01-30 DIAGNOSIS — M5416 Radiculopathy, lumbar region: Secondary | ICD-10-CM | POA: Insufficient documentation

## 2018-01-30 DIAGNOSIS — R42 Dizziness and giddiness: Secondary | ICD-10-CM | POA: Insufficient documentation

## 2018-01-30 NOTE — Telephone Encounter (Signed)
No show. Called patient. Pt said that she thought she cancelled today's appointment yesterday as well. Still sick. Might be able to come to her next follow up session. Dizziness is better.

## 2018-02-03 ENCOUNTER — Ambulatory Visit: Payer: Medicare Other

## 2018-02-05 ENCOUNTER — Ambulatory Visit: Payer: Medicare Other | Admitting: Physical Therapy

## 2018-02-05 DIAGNOSIS — Z9181 History of falling: Secondary | ICD-10-CM

## 2018-02-05 DIAGNOSIS — M6281 Muscle weakness (generalized): Secondary | ICD-10-CM | POA: Diagnosis present

## 2018-02-05 DIAGNOSIS — M5416 Radiculopathy, lumbar region: Secondary | ICD-10-CM | POA: Diagnosis not present

## 2018-02-05 DIAGNOSIS — M25562 Pain in left knee: Secondary | ICD-10-CM | POA: Diagnosis present

## 2018-02-05 DIAGNOSIS — G8929 Other chronic pain: Secondary | ICD-10-CM

## 2018-02-05 DIAGNOSIS — R42 Dizziness and giddiness: Secondary | ICD-10-CM

## 2018-02-05 NOTE — Therapy (Signed)
[facility] [address], [place], [address] Phone: [phone]   Fax:  [phone]  Physical Therapy Treatment Physical Therapy Progress Note   Dates of reporting period  01/02/2018   to   02/05/2018   Patient Details  Name: Destiny Shaw MRN: [id number] Date of Birth: Dec 05, 1950 Referring Provider (PT): Paralee Cancel, MD   Encounter Date: 02/05/2018  PT End of Session - 02/05/18 0955    Visit Number  27    Number of Visits  44    Date for PT Re-Evaluation  02/27/18    Authorization Type  9    Authorization Time Period  of 10 progress note Medicare    PT Start Time  0956    PT Stop Time  1030    PT Time Calculation (min)  34 min    Activity Tolerance  Patient tolerated treatment well    Behavior During Therapy  [facility] for tasks assessed/performed       Past Medical History:  Diagnosis Date  . ADD (attention deficit disorder)   . Anemia   . Anginal pain ([place])    pt states has occas chest pain relates to indigestion; pt uses rest to relieve   . Anxiety   . Arthritis   . Concussion   . Depression   . Diabetes mellitus without complication ([place])   . Dizziness   . Fall   . GERD (gastroesophageal reflux disease)   . Headache   . History of urinary tract infection   . Hyperlipidemia   . Hypertension   . Hypothyroidism   . IBS (irritable bowel syndrome)   . Imbalance   . Numbness    right leg   . Numbness in both hands    comes and goes   . Pneumonia    last episode approx 1 year ago   . Sleep apnea   . Wears glasses     Past Surgical History:  Procedure Laterality Date  . BREAST CYST ASPIRATION Left 1980's   neg  . BREAST CYST EXCISION Right 1980's   neg  . BREAST LUMPECTOMY Right   . BREAST SURGERY    . CARPAL TUNNEL RELEASE    . CESAREAN SECTION     times 2  . COLONOSCOPY WITH PROPOFOL N/A 05/11/2015   Procedure: COLONOSCOPY WITH PROPOFOL;  Surgeon: Manya Silvas, MD;   Location: [facility] ENDOSCOPY;  Service: Endoscopy;  Laterality: N/A;  . DE QUERVAIN'S RELEASE Right   . ESOPHAGOGASTRODUODENOSCOPY (EGD) WITH PROPOFOL N/A 05/11/2015   Procedure: ESOPHAGOGASTRODUODENOSCOPY (EGD) WITH PROPOFOL;  Surgeon: Manya Silvas, MD;  Location: [facility] ENDOSCOPY;  Service: Endoscopy;  Laterality: N/A;  . EYE SURGERY     laser surgery bilat   . HERNIA REPAIR    . KNEE ARTHROSCOPY    . LUMBAR LAMINECTOMY/DECOMPRESSION MICRODISCECTOMY Bilateral 09/14/2015   Procedure: MICRO LUMBAR BILATERAL DECOMPRESSION L4 - L5;  Surgeon: Susa Day, MD;  Location: [facility] ORS;  Service: Orthopedics;  Laterality: Bilateral;  . REDUCTION MAMMAPLASTY Bilateral 1980  . RHINOPLASTY    . TONSILLECTOMY    . TOTAL KNEE ARTHROPLASTY Left 06/18/2016   Procedure: LEFT TOTAL KNEE ARTHROPLASTY;  Surgeon: Paralee Cancel, MD;  Location: [facility] ORS;  Service: Orthopedics;  Laterality: Left;  Adductor Block  . TUBAL LIGATION    . UVULOPALATOPHARYNGOPLASTY      There were no vitals filed for this visit.  Subjective Assessment - 02/05/18 0954    Subjective  Patient  arrived 10 minutes late today and reports that she continues to have a productive cough at night and first thing in the morning. She had chest x-ray on Monday (02/05/2018) and is still waiting on those results. Dizziness is "so much better" and she states that her time with DOr[facility]a is making a huge difference. Back pain is worse.     Pertinent History  Low back pain. Pt states having back surgery about 2-3 years ago. After she had her L TKA, her back started aggravating her due to her walking. Pt also fell about 4 weeks ago onto her L knee. Dr. Alvan Dame checked out her L knee. L knee was fine but has some inflammation.  Pt tries to keep it iced. Pt still recovering for her L TKA but the fall set her back.   Her L knee surgery was last year.  L knee still bothers her a lot.  Had surgery for her back before her L knee surgery. Was doing well until her L knee surgery.  The fall made it worse. Feels debilitating. Also has a hard time getting into and out of the car mainly due to her L knee.   Pt fell due to her L knee bucking on her. Pt was trying to pick something up from the side of the couch.  Also has a hard time getting up from the floor.  Pt states having bladder accidents since after her fall (pt was recommended to tell her MD about it).  Bowel problems since taking medications (was constapated, but after taking medications, pt had diarrhea which was difficult to control. Getting out of the metformin fixed the bowel issue).  Pt states tingling and numbness L lateral LE along the L5 dermatome.  Denies saddle anesthesia.  Pt states that her back problem is mainly on her L side.  Pt landed on her L knee when she fell.  No other falls within the last 6 months. Pt also states being very dizzy since her L knee surgery.  Had PT treatement for her dizziness before which did not help.  The room feels like it is spinning when she gets dizzy.  I feel like it is getting better then she does something.     Patient Stated Goals  Be better able to get into and out of her vehicle (midsized truck), into and out of chairs, be better able to roll in her bed with less L knee pain. Be able to get down on the floor and get back up.     Currently in Pain?  Yes    Pain Score  7     Pain Location  Back    Pain Orientation  Lower    Pain Descriptors / Indicators  Spasm;Throbbing    Pain Type  Chronic pain    Pain Onset  More than a month ago    Pain Frequency  Constant         [facility] PT Assessment - 02/05/18 0001      Berg Balance Test   Sit to Stand  Able to stand without using hands and stabilize independently    Standing Unsupported  Able to stand safely 2 minutes    Sitting with Back Unsupported but Feet Supported on Floor or Stool  Able to sit safely and securely 2 minutes    Stand to Sit  Sits safely with minimal use of hands    Transfers  Able to transfer safely, minor use  of hands  Standing Unsupported with Eyes Closed  Able to stand 10 seconds safely    Standing Ubsupported with Feet Together  Able to place feet together independently and stand 1 minute safely    From Standing, Reach Forward with Outstretched Arm  Can reach forward >12 cm safely (5")    From Standing Position, Pick up Object from [place] to pick up shoe safely and easily    From Standing Position, Turn to Look Behind Over each Shoulder  Looks behind from both sides and weight shifts well    Turn 360 Degrees  Able to turn 360 degrees safely one side only in 4 seconds or less    Standing Unsupported, Alternately Place Feet on Step/Stool  Able to stand independently and safely and complete 8 steps in 20 seconds    Standing Unsupported, One Foot in Front  Able to place foot tandem independently and hold 30 seconds    Standing on One Leg  Able to lift leg independently and hold > 10 seconds    Total Score  54      Therapeutic Exercise TUG: 11.8 seconds Berg: 54/56 Goals reassessment and education on progress and prognosis. Due to time constraints 2/2 to patient's late arrival, the session was limited.    PT Education - 02/05/18 0955    Education Details  body mechanics and exercise technique    Person(s) Educated  Patient    Methods  Explanation;Tactile cues;Demonstration;Verbal cues    Comprehension  Verbalized understanding;Need further instruction;Returned demonstration       PT Short Term Goals - 12/12/17 0930      PT SHORT TERM GOAL #1   Title  Patient will be independent with her HEP to help decrease back and L knee pain and improve ability to perform functional tasks.     Time  3    Period  Weeks    Status  Achieved    Target Date  12/12/17        PT Long Term Goals - 02/05/18 1001      PT LONG TERM GOAL #1   Title  Patient will have a decrease in back pain to 3/10 or less at worst to promote ability to ambulate, turn in bed, perform standing tasks with less pain.      Baseline  6/10 back pain at most (09/24/2017); 7-8/10 at most for the past 7 days, duration of pain is better since starting PT (10/29/2017); 5-6/10 at most for the past 7 days (11/18/2017); 8/10 at most for the past 7 days but the duration of pain is less compared to prior to starting PT (12/12/2017).  7/10 back pain at most for the past 7 days (pt states doing her exercises helps decrease her pain; 01/02/2018); walking (3/10), standing (4/10), bed mobility (0/10) 02/05/2018;    Time  6    Period  Weeks    Status  Partially Met    Target Date  02/27/18      PT LONG TERM GOAL #2   Title  Patient will have a decrease in L knee pain to 3/10 or less at worst to promote ability to ambulate, negotiate stairs, get into and out of a car more comfortably.     Baseline  6/10 L knee pain at most for the past 3 months (09/24/2017); 7-8/10 at most for the past 7 days, duration of pain is better since starting PT (10/29/2017); 7/10 at most for the past 7 days (11/18/2017); 6/10 L knee pain  at most for the past 7 days (12/12/2017); 5/10 L knee pain at most for the past 7 days (pt states doing her exercises helps decrease pain; 01/02/2018); L knee 3/10 (02/05/2018);    Time  6    Period  Weeks    Status  Achieved    Target Date  02/27/18      PT LONG TERM GOAL #3   Title  Patient will improve TUG time to 12 seconds or less as a demonstration of improved functional mobility and balance.     Baseline  TUG no AD: 16.05 seconds on average (09/24/2017); 14.3 seconds on average (10/29/2017); 13 seconds average (11/21/2017); 16.67 seconds average (12/12/2017);  14.17 seconds average (01/02/2018); 11.8 sec sonds (02/05/2018)    Time  6    Period  Weeks    Status  Achieved    Target Date  02/27/18      PT LONG TERM GOAL #4   Title  Patient will improve her back FOTO score by at least 10 points as a demonstration of improved funtion.      Baseline  Back FOTO: 33 (09/24/2017); 38 (10/29/2017); 36 (11/21/2017); 40 (12/12/2017); 42  (01/02/2018)    Time  6    Period  Weeks    Status  On-going      PT LONG TERM GOAL #5   Title  Patient will improve bilateral LE strength by at least 1/2 MMT grade to promote ability to perform standing tasks.     Time  6    Period  Weeks    Status  Achieved      PT LONG TERM GOAL #6   Title  Patient will improve her Modified Oswestry Low Back pain disablity questionnaire by at least 10% as a demonstration of improved function.     Baseline  48% (09/24/2017); 46% (10/29/2017), (11/21/2017); 56% (12/12/2017); 36% (01/02/2018)    Time  6    Period  Weeks    Status  Achieved      PT LONG TERM GOAL #7   Title  Pt will report decreased difficulty stepping into and out of her bathtub as well as getting into and out of her truck to promote ability to get into and out of places.     Baseline  Pt reports difficulty stepping into and out of her bathtub as well as difficulty getting into and out of her truck (01/02/2018); Pt states the truck and bath tub and difficult and painful.  (02/05/2018)    Time  6    Period  Weeks    Status  On-going    Target Date  02/27/18      PT LONG TERM GOAL #8   Title  Patient will improve her BERG balance test score to 46/56 or more as a demonstration of decreased fall risk     Baseline  42/56 (01/02/2018); 54/56 (02/05/2018)    Time  6    Period  Weeks    Status  Achieved    Target Date  02/27/18      PT LONG TERM GOAL  #9   TITLE  Pt will report minimal to no dizziness related to vertigo to promote balance and decrease fall risk.     Baseline  Pt reports dizziness (related to vertigo) which seem to affect her balance (01/15/2018); Patient reports "a little bit every now and then" (02/05/2018)    Time  6    Period  Weeks    Status  Achieved  Target Date  02/27/18            Plan - 02/05/18 1230    Clinical Impression Statement Patient presents to clinic with reports of decreased dizziness and improved overall function and is amenable to therapy.  Patient continues to have persistent pain (7/10) in the L lumbar region. Patient has improved her balance as evidenced by her Berg Balance Scale score of 54/56 and a TUG score of 11 seconds. Patient continues to be limited by back and L knee pain as well as strength deficits which will continue to benefit from skilled therapeutic intervention. Patient's condition has the potential to improve in response to therapy. Maximum improvement is yet to be obtained. The anticipated improvement is attainable and reasonable in a generally predictable time.   Rehab Potential  Fair    Clinical Impairments Affecting Rehab Potential  (-) chronicity of condition, multiple areas of pain, medical history, age; (+) motivated, husband support    PT Frequency  2x / week    PT Duration  6 weeks    PT Treatment/Interventions  Therapeutic activities;Therapeutic exercise;Balance training;Neuromuscular re-education;Patient/family education;Manual techniques;Dry needling;Aquatic Therapy;Electrical Stimulation;Iontophoresis 32m/ml Dexamethasone;Gait training;Canalith Repostioning;Vestibular    PT Next Visit Plan  canalith repositioning, hip and knee strengthening, core strengthening, patellar mobility, manual techniques, modalities PRN    Consulted and Agree with Plan of Care  Patient       Patient will benefit from skilled therapeutic intervention in order to improve the following deficits and impairments:  Pain, Improper body mechanics, Postural dysfunction, Dizziness, Decreased strength, Difficulty walking, Decreased balance  Visit Diagnosis: Radiculopathy, lumbar region  Muscle weakness (generalized)  History of falling  Dizziness and giddiness  Chronic pain of left knee     Problem List Patient Active Problem List   Diagnosis Date Noted  . Obese 06/20/2016  . Status post right knee replacement 06/19/2016  . S/P left TKA 06/18/2016  . Spinal stenosis of lumbar region 09/14/2015   KMyles GipPT, DPT  #[phone]/12/2017, 12:39 PM  [facility] [address] [place] [id number]: [phone]  Fax:  [phone] Name: Destiny RYERMRN: [id number] of Birth: 409-Aug-1952

## 2018-02-06 ENCOUNTER — Telehealth: Payer: Self-pay

## 2018-02-06 ENCOUNTER — Ambulatory Visit: Payer: Medicare Other

## 2018-02-06 NOTE — Telephone Encounter (Signed)
No show. Called patient who said that she thought she changed the schedule last time she was here because her husband has a root canal today and she has to take care of him. Doing real good on dizziness. Still having issues with the back and knee pain.

## 2018-02-10 ENCOUNTER — Ambulatory Visit: Payer: Medicare Other

## 2018-02-10 DIAGNOSIS — M6281 Muscle weakness (generalized): Secondary | ICD-10-CM

## 2018-02-10 DIAGNOSIS — M5416 Radiculopathy, lumbar region: Secondary | ICD-10-CM | POA: Diagnosis not present

## 2018-02-10 NOTE — Patient Instructions (Signed)
[employer] Access Code: [email or web address]   Shoulder Adduction with Anchored Resistance  Red band 10x2 with 5 second holds

## 2018-02-10 NOTE — Therapy (Signed)
[facility] [address] [place], [address] Phone: [phone]   Fax:  [phone]  Physical Therapy Treatment  Patient Details  Name: Destiny Shaw MRN: [id number] Date of Birth: 1950-06-09 Referring Provider (PT): Paralee Cancel, MD   Encounter Date: 02/10/2018  PT End of Session - 02/10/18 1348    Visit Number  28    Number of Visits  44    Date for PT Re-Evaluation  02/27/18    Authorization Type  1    Authorization Time Period  of 10 progress note Medicare    PT Start Time  1740    PT Stop Time  1433    PT Time Calculation (min)  45 min    Activity Tolerance  Patient tolerated treatment well    Behavior During Therapy  [facility] for tasks assessed/performed       Past Medical History:  Diagnosis Date  . ADD (attention deficit disorder)   . Anemia   . Anginal pain ([place])    pt states has occas chest pain relates to indigestion; pt uses rest to relieve   . Anxiety   . Arthritis   . Concussion   . Depression   . Diabetes mellitus without complication ([place])   . Dizziness   . Fall   . GERD (gastroesophageal reflux disease)   . Headache   . History of urinary tract infection   . Hyperlipidemia   . Hypertension   . Hypothyroidism   . IBS (irritable bowel syndrome)   . Imbalance   . Numbness    right leg   . Numbness in both hands    comes and goes   . Pneumonia    last episode approx 1 year ago   . Sleep apnea   . Wears glasses     Past Surgical History:  Procedure Laterality Date  . BREAST CYST ASPIRATION Left 1980's   neg  . BREAST CYST EXCISION Right 1980's   neg  . BREAST LUMPECTOMY Right   . BREAST SURGERY    . CARPAL TUNNEL RELEASE    . CESAREAN SECTION     times 2  . COLONOSCOPY WITH PROPOFOL N/A 05/11/2015   Procedure: COLONOSCOPY WITH PROPOFOL;  Surgeon: Manya Silvas, MD;  Location: [facility] ENDOSCOPY;  Service: Endoscopy;  Laterality: N/A;  . DE QUERVAIN'S RELEASE Right   .  ESOPHAGOGASTRODUODENOSCOPY (EGD) WITH PROPOFOL N/A 05/11/2015   Procedure: ESOPHAGOGASTRODUODENOSCOPY (EGD) WITH PROPOFOL;  Surgeon: Manya Silvas, MD;  Location: [facility] ENDOSCOPY;  Service: Endoscopy;  Laterality: N/A;  . EYE SURGERY     laser surgery bilat   . HERNIA REPAIR    . KNEE ARTHROSCOPY    . LUMBAR LAMINECTOMY/DECOMPRESSION MICRODISCECTOMY Bilateral 09/14/2015   Procedure: MICRO LUMBAR BILATERAL DECOMPRESSION L4 - L5;  Surgeon: Susa Day, MD;  Location: [facility] ORS;  Service: Orthopedics;  Laterality: Bilateral;  . REDUCTION MAMMAPLASTY Bilateral 1980  . RHINOPLASTY    . TONSILLECTOMY    . TOTAL KNEE ARTHROPLASTY Left 06/18/2016   Procedure: LEFT TOTAL KNEE ARTHROPLASTY;  Surgeon: Paralee Cancel, MD;  Location: [facility] ORS;  Service: Orthopedics;  Laterality: Left;  Adductor Block  . TUBAL LIGATION    . UVULOPALATOPHARYNGOPLASTY      There were no vitals filed for this visit.  Subjective Assessment - 02/10/18 1349    Subjective  Back and knee are not bad today. L knee has not been hurting today. Back is not constant, just a  little achy with certain movements 5/64 currently low back L > R.     Pertinent History  Low back pain. Pt states having back surgery about 2-3 years ago. After she had her L TKA, her back started aggravating her due to her walking. Pt also fell about 4 weeks ago onto her L knee. Dr. Alvan Dame checked out her L knee. L knee was fine but has some inflammation.  Pt tries to keep it iced. Pt still recovering for her L TKA but the fall set her back.   Her L knee surgery was last year.  L knee still bothers her a lot.  Had surgery for her back before her L knee surgery. Was doing well until her L knee surgery. The fall made it worse. Feels debilitating. Also has a hard time getting into and out of the car mainly due to her L knee.   Pt fell due to her L knee bucking on her. Pt was trying to pick something up from the side of the couch.  Also has a hard time getting up from the floor.   Pt states having bladder accidents since after her fall (pt was recommended to tell her MD about it).  Bowel problems since taking medications (was constapated, but after taking medications, pt had diarrhea which was difficult to control. Getting out of the metformin fixed the bowel issue).  Pt states tingling and numbness L lateral LE along the L5 dermatome.  Denies saddle anesthesia.  Pt states that her back problem is mainly on her L side.  Pt landed on her L knee when she fell.  No other falls within the last 6 months. Pt also states being very dizzy since her L knee surgery.  Had PT treatement for her dizziness before which did not help.  The room feels like it is spinning when she gets dizzy.  I feel like it is getting better then she does something.     Patient Stated Goals  Be better able to get into and out of her vehicle (midsized truck), into and out of chairs, be better able to roll in her bed with less L knee pain. Be able to get down on the floor and get back up.     Currently in Pain?  Yes    Pain Score  3     Pain Onset  More than a month ago                               PT Education - 02/10/18 1417    Education Details  ther-ex    Person(s) Educated  Patient    Methods  Explanation;Demonstration;Tactile cues;Verbal cues    Comprehension  Returned demonstration;Verbalized understanding        Objectives   Medbridge Access Code: [email or web address]    Latex free bands used  Therapeutic exercise  Supine L hip IR stretch with PT 2x. Decreased L low back pain   Sitting posture: R lateral shift  Manual R lateral shift correction: decreased back pain  Standing L shoulder adduction red band 10x5 seconds for 3 sets   Decreased back pain   Reviewed and given as part of HEP.   Scapular retractions red band 10x5 seconds for 2 sets   Standing hip abduction 2 lbs with B UE assist   L 10x2  R 10x2    Standing LE leg press resisting double blue band  with  B UE assist 10x   Improved exercise technique, movement at target joints, use of target muscles after min to mod verbal, visual, tactile cues.    Decreased low back pain with treatment to decrease R lateral shift. Also continued working on thoracic extension and glute strengthening to decrease stress to low back. Pt will benefit from continued skilled physical therapy services to decrease pain, improve strength and function.            PT Short Term Goals - 12/12/17 0930      PT SHORT TERM GOAL #1   Title  Patient will be independent with her HEP to help decrease back and L knee pain and improve ability to perform functional tasks.     Time  3    Period  Weeks    Status  Achieved    Target Date  12/12/17        PT Long Term Goals - 02/05/18 1001      PT LONG TERM GOAL #1   Title  Patient will have a decrease in back pain to 3/10 or less at worst to promote ability to ambulate, turn in bed, perform standing tasks with less pain.     Baseline  6/10 back pain at most (09/24/2017); 7-8/10 at most for the past 7 days, duration of pain is better since starting PT (10/29/2017); 5-6/10 at most for the past 7 days (11/18/2017); 8/10 at most for the past 7 days but the duration of pain is less compared to prior to starting PT (12/12/2017).  7/10 back pain at most for the past 7 days (pt states doing her exercises helps decrease her pain; 01/02/2018); walking (3/10), standing (4/10), bed mobility (0/10) 02/05/2018;    Time  6    Period  Weeks    Status  Partially Met    Target Date  02/27/18      PT LONG TERM GOAL #2   Title  Patient will have a decrease in L knee pain to 3/10 or less at worst to promote ability to ambulate, negotiate stairs, get into and out of a car more comfortably.     Baseline  6/10 L knee pain at most for the past 3 months (09/24/2017); 7-8/10 at most for the past 7 days, duration of pain is better since starting PT (10/29/2017); 7/10 at most for the past 7 days  (11/18/2017); 6/10 L knee pain at most for the past 7 days (12/12/2017); 5/10 L knee pain at most for the past 7 days (pt states doing her exercises helps decrease pain; 01/02/2018); L knee 3/10 (02/05/2018);    Time  6    Period  Weeks    Status  Achieved    Target Date  02/27/18      PT LONG TERM GOAL #3   Title  Patient will improve TUG time to 12 seconds or less as a demonstration of improved functional mobility and balance.     Baseline  TUG no AD: 16.05 seconds on average (09/24/2017); 14.3 seconds on average (10/29/2017); 13 seconds average (11/21/2017); 16.67 seconds average (12/12/2017);  14.17 seconds average (01/02/2018); 11.8 sec sonds (02/05/2018)    Time  6    Period  Weeks    Status  Achieved    Target Date  02/27/18      PT LONG TERM GOAL #4   Title  Patient will improve her back FOTO score by at least 10 points as a demonstration of improved funtion.  Baseline  Back FOTO: 33 (09/24/2017); 38 (10/29/2017); 36 (11/21/2017); 40 (12/12/2017); 42 (01/02/2018)    Time  6    Period  Weeks    Status  On-going      PT LONG TERM GOAL #5   Title  Patient will improve bilateral LE strength by at least 1/2 MMT grade to promote ability to perform standing tasks.     Time  6    Period  Weeks    Status  Achieved      PT LONG TERM GOAL #6   Title  Patient will improve her Modified Oswestry Low Back pain disablity questionnaire by at least 10% as a demonstration of improved function.     Baseline  48% (09/24/2017); 46% (10/29/2017), (11/21/2017); 56% (12/12/2017); 36% (01/02/2018)    Time  6    Period  Weeks    Status  Achieved      PT LONG TERM GOAL #7   Title  Pt will report decreased difficulty stepping into and out of her bathtub as well as getting into and out of her truck to promote ability to get into and out of places.     Baseline  Pt reports difficulty stepping into and out of her bathtub as well as difficulty getting into and out of her truck (01/02/2018); Pt states the truck and bath  tub and difficult and painful.  (02/05/2018)    Time  6    Period  Weeks    Status  On-going    Target Date  02/27/18      PT LONG TERM GOAL #8   Title  Patient will improve her BERG balance test score to 46/56 or more as a demonstration of decreased fall risk     Baseline  42/56 (01/02/2018); 54/56 (02/05/2018)    Time  6    Period  Weeks    Status  Achieved    Target Date  02/27/18      PT LONG TERM GOAL  #9   TITLE  Pt will report minimal to no dizziness related to vertigo to promote balance and decrease fall risk.     Baseline  Pt reports dizziness (related to vertigo) which seem to affect her balance (01/15/2018); Patient reports "a little bit every now and then" (02/05/2018)    Time  6    Period  Weeks    Status  Achieved    Target Date  02/27/18            Plan - 02/10/18 1417    Clinical Impression Statement  Decreased low back pain with treatment to decrease R lateral shift. Also continued working on thoracic extension and glute strengthening to decrease stress to low back. Pt will benefit from continued skilled physical therapy services to decrease pain, improve strength and function.     Rehab Potential  Fair    Clinical Impairments Affecting Rehab Potential  (-) chronicity of condition, multiple areas of pain, medical history, age; (+) motivated, husband support    PT Frequency  2x / week    PT Duration  6 weeks    PT Treatment/Interventions  Therapeutic activities;Therapeutic exercise;Balance training;Neuromuscular re-education;Patient/family education;Manual techniques;Dry needling;Aquatic Therapy;Electrical Stimulation;Iontophoresis 49m/ml Dexamethasone;Gait training;Canalith Repostioning;Vestibular    PT Next Visit Plan  canalith repositioning, hip and knee strengthening, core strengthening, patellar mobility, manual techniques, modalities PRN    Consulted and Agree with Plan of Care  Patient       Patient will benefit from skilled therapeutic intervention in  order to improve the following deficits and impairments:  Pain, Improper body mechanics, Postural dysfunction, Dizziness, Decreased strength, Difficulty walking, Decreased balance  Visit Diagnosis: Radiculopathy, lumbar region  Muscle weakness (generalized)     Problem List Patient Active Problem List   Diagnosis Date Noted  . Obese 06/20/2016  . Status post right knee replacement 06/19/2016  . S/P left TKA 06/18/2016  . Spinal stenosis of lumbar region 09/14/2015    Joneen Boers PT, DPT   02/10/2018, 6:37 PM  [facility] [address] [place], [address] Phone: [phone]   Fax:  [phone]  Name: Destiny Shaw MRN: [id number] Date of Birth: 1950-03-09

## 2018-02-13 ENCOUNTER — Ambulatory Visit: Payer: Medicare Other

## 2018-02-17 ENCOUNTER — Ambulatory Visit: Payer: Medicare Other

## 2018-02-17 DIAGNOSIS — M5416 Radiculopathy, lumbar region: Secondary | ICD-10-CM

## 2018-02-17 DIAGNOSIS — Z9181 History of falling: Secondary | ICD-10-CM

## 2018-02-17 DIAGNOSIS — M6281 Muscle weakness (generalized): Secondary | ICD-10-CM

## 2018-02-17 DIAGNOSIS — M25562 Pain in left knee: Secondary | ICD-10-CM

## 2018-02-17 DIAGNOSIS — G8929 Other chronic pain: Secondary | ICD-10-CM

## 2018-02-17 NOTE — Therapy (Signed)
[facility] [address] [place], [address] Phone: [phone]   Fax:  [phone]  Physical Therapy Treatment  Patient Details  Name: Destiny Shaw MRN: [id number] Date of Birth: 12/03/1950 Referring Provider (PT): Paralee Cancel, MD   Encounter Date: 02/17/2018  PT End of Session - 02/17/18 1350    Visit Number  29    Number of Visits  44    Date for PT Re-Evaluation  02/27/18    Authorization Type  2    Authorization Time Period  of 10 progress note Medicare    PT Start Time  1350    PT Stop Time  1443    PT Time Calculation (min)  53 min    Activity Tolerance  Patient tolerated treatment well    Behavior During Therapy  [facility] for tasks assessed/performed       Past Medical History:  Diagnosis Date  . ADD (attention deficit disorder)   . Anemia   . Anginal pain ([place])    pt states has occas chest pain relates to indigestion; pt uses rest to relieve   . Anxiety   . Arthritis   . Concussion   . Depression   . Diabetes mellitus without complication ([place])   . Dizziness   . Fall   . GERD (gastroesophageal reflux disease)   . Headache   . History of urinary tract infection   . Hyperlipidemia   . Hypertension   . Hypothyroidism   . IBS (irritable bowel syndrome)   . Imbalance   . Numbness    right leg   . Numbness in both hands    comes and goes   . Pneumonia    last episode approx 1 year ago   . Sleep apnea   . Wears glasses     Past Surgical History:  Procedure Laterality Date  . BREAST CYST ASPIRATION Left 1980's   neg  . BREAST CYST EXCISION Right 1980's   neg  . BREAST LUMPECTOMY Right   . BREAST SURGERY    . CARPAL TUNNEL RELEASE    . CESAREAN SECTION     times 2  . COLONOSCOPY WITH PROPOFOL N/A 05/11/2015   Procedure: COLONOSCOPY WITH PROPOFOL;  Surgeon: Manya Silvas, MD;  Location[facility] ENDOSCOPY;  Service: Endoscopy;  Laterality: N/A;  . DE QUERVAIN'S RELEASE Right   .  ESOPHAGOGASTRODUODENOSCOPY (EGD) WITH PROPOFOL N/A 05/11/2015   Procedure: ESOPHAGOGASTRODUODENOSCOPY (EGD) WITH PROPOFOL;  Surgeon: Manya Silvas, MD;  Location: [facility] ENDOSCOPY;  Service: Endoscopy;  Laterality: N/A;  . EYE SURGERY     laser surgery bilat   . HERNIA REPAIR    . KNEE ARTHROSCOPY    . LUMBAR LAMINECTOMY/DECOMPRESSION MICRODISCECTOMY Bilateral 09/14/2015   Procedure: MICRO LUMBAR BILATERAL DECOMPRESSION L4 - L5;  Surgeon: Susa Day, MD;  Location: [facility] ORS;  Service: Orthopedics;  Laterality: Bilateral;  . REDUCTION MAMMAPLASTY Bilateral 1980  . RHINOPLASTY    . TONSILLECTOMY    . TOTAL KNEE ARTHROPLASTY Left 06/18/2016   Procedure: LEFT TOTAL KNEE ARTHROPLASTY;  Surgeon: Paralee Cancel, MD;  Location: [facility] ORS;  Service: Orthopedics;  Laterality: Left;  Adductor Block  . TUBAL LIGATION    . UVULOPALATOPHARYNGOPLASTY      There were no vitals filed for this visit.  Subjective Assessment - 02/17/18 1351    Subjective  Back spasms let up. Sore. 5/10 currently. Was better after last session until today. Pt also states that her L  upper trap area is sore today.     Pertinent History  Low back pain. Pt states having back surgery about 2-3 years ago. After she had her L TKA, her back started aggravating her due to her walking. Pt also fell about 4 weeks ago onto her L knee. Dr. Alvan Dame checked out her L knee. L knee was fine but has some inflammation.  Pt tries to keep it iced. Pt still recovering for her L TKA but the fall set her back.   Her L knee surgery was last year.  L knee still bothers her a lot.  Had surgery for her back before her L knee surgery. Was doing well until her L knee surgery. The fall made it worse. Feels debilitating. Also has a hard time getting into and out of the car mainly due to her L knee.   Pt fell due to her L knee bucking on her. Pt was trying to pick something up from the side of the couch.  Also has a hard time getting up from the floor.  Pt states having  bladder accidents since after her fall (pt was recommended to tell her MD about it).  Bowel problems since taking medications (was constapated, but after taking medications, pt had diarrhea which was difficult to control. Getting out of the metformin fixed the bowel issue).  Pt states tingling and numbness L lateral LE along the L5 dermatome.  Denies saddle anesthesia.  Pt states that her back problem is mainly on her L side.  Pt landed on her L knee when she fell.  No other falls within the last 6 months. Pt also states being very dizzy since her L knee surgery.  Had PT treatement for her dizziness before which did not help.  The room feels like it is spinning when she gets dizzy.  I feel like it is getting better then she does something.     Patient Stated Goals  Be better able to get into and out of her vehicle (midsized truck), into and out of chairs, be better able to roll in her bed with less L knee pain. Be able to get down on the floor and get back up.     Currently in Pain?  Yes    Pain Score  5     Pain Onset  More than a month ago                               PT Education - 02/17/18 1427    Education Details  ther-ex    Person(s) Educated  Patient    Methods  Explanation;Demonstration;Tactile cues;Verbal cues    Comprehension  Returned demonstration;Verbalized understanding       Objectives   MedbridgeAccess Code: [email or web address]   Latex free bands used  Pt also wants to be better able to step over her bath tub to get into her shower.   Therapeutic exercise  Emphasized performing HEP such as when in spasm for her back  Pt was recommended to perform trunk flexion as well as activating her trunk muscles when her back goes into spasms.  Pt was also recommended to continue performing her standing L shoulder adduction with band exercise.   Standing L shoulder adduction red band 10x5 seconds for 3 sets              Decreased back pain. Decreased L  upper trap pain.   Standing  B scapular retraction green band 10x with 5 second holds  Then 10x5 seconds with red band   R S/L L hip abduction 5x3 to help decrease R lateral shift and promote glute med strengthening.   Supine L hip IR stretch with PT 1x. Decreased L low back pain   Standing R lateral shift correction 10x5 seconds for 2 sets  Decreased back pain  2/10 back pain after performing aforementioned exercises. Slight L lateral thigh discomfort with standing terminal knee extension  Seated physioball rolls  Forward 10x5 seconds  Then to the R 10x5 seconds   then to the L 10x5 seconds   Decreased L lateral thigh discomfort with standing terminal knee extension afterwards  Standing hip abduction 2 lbs with B UE assist              L 10x2             R 10x2   Improved exercise technique, movement at target joints, use of target muscles after mod verbal, visual, tactile cues.     Continued working on decreasing R lateral shift posture, improving thoracic extension and glute strength to help decrease extension pressure to her low back. Pt states decreased neck and L shoulder/upper trap, and low back pain after session. Pt will benefit from continued skilled physical therapy services to decrease pain, improve strength and function.        PT Short Term Goals - 12/12/17 0930      PT SHORT TERM GOAL #1   Title  Patient will be independent with her HEP to help decrease back and L knee pain and improve ability to perform functional tasks.     Time  3    Period  Weeks    Status  Achieved    Target Date  12/12/17        PT Long Term Goals - 02/05/18 1001      PT LONG TERM GOAL #1   Title  Patient will have a decrease in back pain to 3/10 or less at worst to promote ability to ambulate, turn in bed, perform standing tasks with less pain.     Baseline  6/10 back pain at most (09/24/2017); 7-8/10 at most for the past 7 days, duration of pain is better since starting PT  (10/29/2017); 5-6/10 at most for the past 7 days (11/18/2017); 8/10 at most for the past 7 days but the duration of pain is less compared to prior to starting PT (12/12/2017).  7/10 back pain at most for the past 7 days (pt states doing her exercises helps decrease her pain; 01/02/2018); walking (3/10), standing (4/10), bed mobility (0/10) 02/05/2018;    Time  6    Period  Weeks    Status  Partially Met    Target Date  02/27/18      PT LONG TERM GOAL #2   Title  Patient will have a decrease in L knee pain to 3/10 or less at worst to promote ability to ambulate, negotiate stairs, get into and out of a car more comfortably.     Baseline  6/10 L knee pain at most for the past 3 months (09/24/2017); 7-8/10 at most for the past 7 days, duration of pain is better since starting PT (10/29/2017); 7/10 at most for the past 7 days (11/18/2017); 6/10 L knee pain at most for the past 7 days (12/12/2017); 5/10 L knee pain at most for the past 7 days (pt states doing her  exercises helps decrease pain; 01/02/2018); L knee 3/10 (02/05/2018);    Time  6    Period  Weeks    Status  Achieved    Target Date  02/27/18      PT LONG TERM GOAL #3   Title  Patient will improve TUG time to 12 seconds or less as a demonstration of improved functional mobility and balance.     Baseline  TUG no AD: 16.05 seconds on average (09/24/2017); 14.3 seconds on average (10/29/2017); 13 seconds average (11/21/2017); 16.67 seconds average (12/12/2017);  14.17 seconds average (01/02/2018); 11.8 sec sonds (02/05/2018)    Time  6    Period  Weeks    Status  Achieved    Target Date  02/27/18      PT LONG TERM GOAL #4   Title  Patient will improve her back FOTO score by at least 10 points as a demonstration of improved funtion.      Baseline  Back FOTO: 33 (09/24/2017); 38 (10/29/2017); 36 (11/21/2017); 40 (12/12/2017); 42 (01/02/2018)    Time  6    Period  Weeks    Status  On-going      PT LONG TERM GOAL #5   Title  Patient will improve bilateral  LE strength by at least 1/2 MMT grade to promote ability to perform standing tasks.     Time  6    Period  Weeks    Status  Achieved      PT LONG TERM GOAL #6   Title  Patient will improve her Modified Oswestry Low Back pain disablity questionnaire by at least 10% as a demonstration of improved function.     Baseline  48% (09/24/2017); 46% (10/29/2017), (11/21/2017); 56% (12/12/2017); 36% (01/02/2018)    Time  6    Period  Weeks    Status  Achieved      PT LONG TERM GOAL #7   Title  Pt will report decreased difficulty stepping into and out of her bathtub as well as getting into and out of her truck to promote ability to get into and out of places.     Baseline  Pt reports difficulty stepping into and out of her bathtub as well as difficulty getting into and out of her truck (01/02/2018); Pt states the truck and bath tub and difficult and painful.  (02/05/2018)    Time  6    Period  Weeks    Status  On-going    Target Date  02/27/18      PT LONG TERM GOAL #8   Title  Patient will improve her BERG balance test score to 46/56 or more as a demonstration of decreased fall risk     Baseline  42/56 (01/02/2018); 54/56 (02/05/2018)    Time  6    Period  Weeks    Status  Achieved    Target Date  02/27/18      PT LONG TERM GOAL  #9   TITLE  Pt will report minimal to no dizziness related to vertigo to promote balance and decrease fall risk.     Baseline  Pt reports dizziness (related to vertigo) which seem to affect her balance (01/15/2018); Patient reports "a little bit every now and then" (02/05/2018)    Time  6    Period  Weeks    Status  Achieved    Target Date  02/27/18            Plan - 02/17/18 1349  Clinical Impression Statement  Continued working on decreasing R lateral shift posture, improving thoracic extension and glute strength to help decrease extension pressure to her low back. Pt states decreased neck and L shoulder/upper trap, and low back pain after session. Pt will  benefit from continued skilled physical therapy services to decrease pain, improve strength and function.     Rehab Potential  Fair    Clinical Impairments Affecting Rehab Potential  (-) chronicity of condition, multiple areas of pain, medical history, age; (+) motivated, husband support    PT Frequency  2x / week    PT Duration  6 weeks    PT Treatment/Interventions  Therapeutic activities;Therapeutic exercise;Balance training;Neuromuscular re-education;Patient/family education;Manual techniques;Dry needling;Aquatic Therapy;Electrical Stimulation;Iontophoresis 45m/ml Dexamethasone;Gait training;Canalith Repostioning;Vestibular    PT Next Visit Plan  canalith repositioning, hip and knee strengthening, core strengthening, patellar mobility, manual techniques, modalities PRN    Consulted and Agree with Plan of Care  Patient       Patient will benefit from skilled therapeutic intervention in order to improve the following deficits and impairments:  Pain, Improper body mechanics, Postural dysfunction, Dizziness, Decreased strength, Difficulty walking, Decreased balance  Visit Diagnosis: Radiculopathy, lumbar region  Muscle weakness (generalized)  History of falling  Chronic pain of left knee     Problem List Patient Active Problem List   Diagnosis Date Noted  . Obese 06/20/2016  . Status post right knee replacement 06/19/2016  . S/P left TKA 06/18/2016  . Spinal stenosis of lumbar region 09/14/2015    MJoneen BoersPT, DPT   02/17/2018, 3:08 PM  [facility] [address] [place] [id number]: [phone]  Fax:  [phone] Name: RTONDALAYA PERRENMRN: [id number] of Birth: 404/21/1952

## 2018-02-20 ENCOUNTER — Ambulatory Visit: Payer: Medicare Other

## 2018-02-24 ENCOUNTER — Ambulatory Visit: Payer: Medicare Other

## 2018-02-24 DIAGNOSIS — G8929 Other chronic pain: Secondary | ICD-10-CM

## 2018-02-24 DIAGNOSIS — M6281 Muscle weakness (generalized): Secondary | ICD-10-CM

## 2018-02-24 DIAGNOSIS — M5416 Radiculopathy, lumbar region: Secondary | ICD-10-CM

## 2018-02-24 DIAGNOSIS — Z9181 History of falling: Secondary | ICD-10-CM

## 2018-02-24 DIAGNOSIS — M25562 Pain in left knee: Secondary | ICD-10-CM

## 2018-02-24 NOTE — Therapy (Signed)
[facility] [address] [place], [address] Phone: [phone]   Fax:  [phone]  Physical Therapy Treatment  Patient Details  Name: Destiny Shaw MRN: [id number] Date of Birth: [birth year] Referring Provider (PT): Paralee Cancel, MD   Encounter Date: 02/24/2018  PT End of Session - 02/24/18 1352    Visit Number  30    Number of Visits  44    Date for PT Re-Evaluation  02/27/18    Authorization Type  3    Authorization Time Period  of 10 progress note Medicare    PT Start Time  1352    PT Stop Time  1433    PT Time Calculation (min)  41 min    Equipment Utilized During Treatment  Gait belt    Activity Tolerance  Patient tolerated treatment well    Behavior During Therapy  WFL for tasks assessed/performed       Past Medical History:  Diagnosis Date  . ADD (attention deficit disorder)   . Anemia   . Anginal pain ([place])    pt states has occas chest pain relates to indigestion; pt uses rest to relieve   . Anxiety   . Arthritis   . Concussion   . Depression   . Diabetes mellitus without complication ([place])   . Dizziness   . Fall   . GERD (gastroesophageal reflux disease)   . Headache   . History of urinary tract infection   . Hyperlipidemia   . Hypertension   . Hypothyroidism   . IBS (irritable bowel syndrome)   . Imbalance   . Numbness    right leg   . Numbness in both hands    comes and goes   . Pneumonia    last episode approx 1 year ago   . Sleep apnea   . Wears glasses     Past Surgical History:  Procedure Laterality Date  . BREAST CYST ASPIRATION Left 1980's   neg  . BREAST CYST EXCISION Right 1980's   neg  . BREAST LUMPECTOMY Right   . BREAST SURGERY    . CARPAL TUNNEL RELEASE    . CESAREAN SECTION     times 2  . COLONOSCOPY WITH PROPOFOL N/A 05/11/2015   Procedure: COLONOSCOPY WITH PROPOFOL;  Surgeon: Manya Silvas, MD;  Location: [facility] ENDOSCOPY;  Service: Endoscopy;   Laterality: N/A;  . DE QUERVAIN'S RELEASE Right   . ESOPHAGOGASTRODUODENOSCOPY (EGD) WITH PROPOFOL N/A 05/11/2015   Procedure: ESOPHAGOGASTRODUODENOSCOPY (EGD) WITH PROPOFOL;  Surgeon: Manya Silvas, MD;  Location: [facility] ENDOSCOPY;  Service: Endoscopy;  Laterality: N/A;  . EYE SURGERY     laser surgery bilat   . HERNIA REPAIR    . KNEE ARTHROSCOPY    . LUMBAR LAMINECTOMY/DECOMPRESSION MICRODISCECTOMY Bilateral 09/14/2015   Procedure: MICRO LUMBAR BILATERAL DECOMPRESSION L4 - L5;  Surgeon: Susa Day, MD;  Location: [facility] ORS;  Service: Orthopedics;  Laterality: Bilateral;  . REDUCTION MAMMAPLASTY Bilateral 1980  . RHINOPLASTY    . TONSILLECTOMY    . TOTAL KNEE ARTHROPLASTY Left 06/18/2016   Procedure: LEFT TOTAL KNEE ARTHROPLASTY;  Surgeon: Paralee Cancel, MD;  Location: [facility] ORS;  Service: Orthopedics;  Laterality: Left;  Adductor Block  . TUBAL LIGATION    . UVULOPALATOPHARYNGOPLASTY      There were no vitals filed for this visit.  Subjective Assessment - 02/24/18 1353    Subjective  Back is doing a little better today. L knee seems  to be a little better, still not doing what she wants it to do. Still has a difficult time stepping over the bath tub to get into the shower. Still has issues with her back and knee getting into and out of the truck.  3/10 back pain currently (5-6/10 at worst for the past 7 days).  4/10 L knee pain currently, 5/10 at most for the past 7 days.    Feels soreness L shoulder    Pertinent History  Low back pain. Pt states having back surgery about 2-3 years ago. After she had her L TKA, her back started aggravating her due to her walking. Pt also fell about 4 weeks ago onto her L knee. Dr. Alvan Dame checked out her L knee. L knee was fine but has some inflammation.  Pt tries to keep it iced. Pt still recovering for her L TKA but the fall set her back.   Her L knee surgery was last year.  L knee still bothers her a lot.  Had surgery for her back before her L knee surgery. Was  doing well until her L knee surgery. The fall made it worse. Feels debilitating. Also has a hard time getting into and out of the car mainly due to her L knee.   Pt fell due to her L knee bucking on her. Pt was trying to pick something up from the side of the couch.  Also has a hard time getting up from the floor.  Pt states having bladder accidents since after her fall (pt was recommended to tell her MD about it).  Bowel problems since taking medications (was constapated, but after taking medications, pt had diarrhea which was difficult to control. Getting out of the metformin fixed the bowel issue).  Pt states tingling and numbness L lateral LE along the L5 dermatome.  Denies saddle anesthesia.  Pt states that her back problem is mainly on her L side.  Pt landed on her L knee when she fell.  No other falls within the last 6 months. Pt also states being very dizzy since her L knee surgery.  Had PT treatement for her dizziness before which did not help.  The room feels like it is spinning when she gets dizzy.  I feel like it is getting better then she does something.     Patient Stated Goals  Be better able to get into and out of her vehicle (midsized truck), into and out of chairs, be better able to roll in her bed with less L knee pain. Be able to get down on the floor and get back up.     Currently in Pain?  Yes    Pain Score  4    L knee, 3/10 back   Pain Onset  More than a month ago                               PT Education - 02/24/18 1401    Education Details  ther-ex    [employer]) Educated  Patient    Methods  Explanation;Demonstration;Tactile cues;Verbal cues    Comprehension  Returned demonstration;Verbalized understanding      Objectives   MedbridgeAccess Code: [email or web address]   Latex free bands used  Pt also wants to be better able to step over her bath tub to get into her shower.   Pt also adds that she wants to work on floor to stand transfers.  Therapeutic exercise  Seated L hip extension isometrics 10x5 seconds for 2 sets  Decreased L hip pain.   Standing L shoulder adduction red band 10x5 seconds for 3 sets to help decrease lateral shift.    Standing B scapular retraction red band 10x5 second holds for 3 sets.   Decreased L lateral neck pain with slight chin tuck and cervical flexion. Decreased L anterior shoulder soreness  Stepping over 8 inch cones multiple times with and without UE assist   Able to step over with L LE with decreasing difficulty with increased repetitions.   Stepping over PVC rod at simulated bath tub height with one UE assist  Difficulty performing secondary to limited L knee flexion ROM  Seated L knee flexion AROM 102 degrees  AAROM 120 degrees  Seated L knee flexion AAROM 10x with PT assist 10x3  Improved seated L knee flexion AROM   Try standing hip machine hip extension next visit if appropriate.    Reviewed plan of care: continue for another 2x/week for 8 weeks    Improved exercise technique, movement at target joints, use of target muscles after mod verbal, visual, tactile cues.    Difficulty with stepping over bath tub motion in which limited L knee flexion AROM may play a factor. Decreased back pain with lateral shift correction and activation of L glute max muscles. Pt tolerated session well without aggravation of symptoms. Pt will benefit from continued skilled physical therapy services to decrease pain, improve strength and function.          PT Short Term Goals - 12/12/17 0930      PT SHORT TERM GOAL #1   Title  Patient will be independent with her HEP to help decrease back and L knee pain and improve ability to perform functional tasks.     Time  3    Period  Weeks    Status  Achieved    Target Date  12/12/17        PT Long Term Goals - 02/05/18 1001      PT LONG TERM GOAL #1   Title  Patient will have a decrease in back pain to 3/10 or less at worst  to promote ability to ambulate, turn in bed, perform standing tasks with less pain.     Baseline  6/10 back pain at most (09/24/2017); 7-8/10 at most for the past 7 days, duration of pain is better since starting PT (10/29/2017); 5-6/10 at most for the past 7 days (11/18/2017); 8/10 at most for the past 7 days but the duration of pain is less compared to prior to starting PT (12/12/2017).  7/10 back pain at most for the past 7 days (pt states doing her exercises helps decrease her pain; 01/02/2018); walking (3/10), standing (4/10), bed mobility (0/10) 02/05/2018;    Time  6    Period  Weeks    Status  Partially Met    Target Date  02/27/18      PT LONG TERM GOAL #2   Title  Patient will have a decrease in L knee pain to 3/10 or less at worst to promote ability to ambulate, negotiate stairs, get into and out of a car more comfortably.     Baseline  6/10 L knee pain at most for the past 3 months (09/24/2017); 7-8/10 at most for the past 7 days, duration of pain is better since starting PT (10/29/2017); 7/10 at most for the past 7 days (11/18/2017); 6/10 L knee  pain at most for the past 7 days (12/12/2017); 5/10 L knee pain at most for the past 7 days (pt states doing her exercises helps decrease pain; 01/02/2018); L knee 3/10 (02/05/2018);    Time  6    Period  Weeks    Status  Achieved    Target Date  02/27/18      PT LONG TERM GOAL #3   Title  Patient will improve TUG time to 12 seconds or less as a demonstration of improved functional mobility and balance.     Baseline  TUG no AD: 16.05 seconds on average (09/24/2017); 14.3 seconds on average (10/29/2017); 13 seconds average (11/21/2017); 16.67 seconds average (12/12/2017);  14.17 seconds average (01/02/2018); 11.8 sec sonds (02/05/2018)    Time  6    Period  Weeks    Status  Achieved    Target Date  02/27/18      PT LONG TERM GOAL #4   Title  Patient will improve her back FOTO score by at least 10 points as a demonstration of improved funtion.       Baseline  Back FOTO: 33 (09/24/2017); 38 (10/29/2017); 36 (11/21/2017); 40 (12/12/2017); 42 (01/02/2018)    Time  6    Period  Weeks    Status  On-going      PT LONG TERM GOAL #5   Title  Patient will improve bilateral LE strength by at least 1/2 MMT grade to promote ability to perform standing tasks.     Time  6    Period  Weeks    Status  Achieved      PT LONG TERM GOAL #6   Title  Patient will improve her Modified Oswestry Low Back pain disablity questionnaire by at least 10% as a demonstration of improved function.     Baseline  48% (09/24/2017); 46% (10/29/2017), (11/21/2017); 56% (12/12/2017); 36% (01/02/2018)    Time  6    Period  Weeks    Status  Achieved      PT LONG TERM GOAL #7   Title  Pt will report decreased difficulty stepping into and out of her bathtub as well as getting into and out of her truck to promote ability to get into and out of places.     Baseline  Pt reports difficulty stepping into and out of her bathtub as well as difficulty getting into and out of her truck (01/02/2018); Pt states the truck and bath tub and difficult and painful.  (02/05/2018)    Time  6    Period  Weeks    Status  On-going    Target Date  02/27/18      PT LONG TERM GOAL #8   Title  Patient will improve her BERG balance test score to 46/56 or more as a demonstration of decreased fall risk     Baseline  42/56 (01/02/2018); 54/56 (02/05/2018)    Time  6    Period  Weeks    Status  Achieved    Target Date  02/27/18      PT LONG TERM GOAL  #9   TITLE  Pt will report minimal to no dizziness related to vertigo to promote balance and decrease fall risk.     Baseline  Pt reports dizziness (related to vertigo) which seem to affect her balance (01/15/2018); Patient reports "a little bit every now and then" (02/05/2018)    Time  6    Period  Weeks    Status  Achieved    Target Date  02/27/18            Plan - 02/24/18 1402    Clinical Impression Statement  Difficulty with stepping over bath  tub motion in which limited L knee flexion AROM may play a factor. Decreased back pain with lateral shift correction and activation of L glute max muscles. Pt tolerated session well without aggravation of symptoms. Pt will benefit from continued skilled physical therapy services to decrease pain, improve strength and function.     Rehab Potential  Fair    Clinical Impairments Affecting Rehab Potential  (-) chronicity of condition, multiple areas of pain, medical history, age; (+) motivated, husband support    PT Frequency  2x / week    PT Duration  6 weeks    PT Treatment/Interventions  Therapeutic activities;Therapeutic exercise;Balance training;Neuromuscular re-education;Patient/family education;Manual techniques;Dry needling;Aquatic Therapy;Electrical Stimulation;Iontophoresis 65m/ml Dexamethasone;Gait training;Canalith Repostioning;Vestibular    PT Next Visit Plan  canalith repositioning, hip and knee strengthening, core strengthening, patellar mobility, manual techniques, modalities PRN    Consulted and Agree with Plan of Care  Patient       Patient will benefit from skilled therapeutic intervention in order to improve the following deficits and impairments:  Pain, Improper body mechanics, Postural dysfunction, Dizziness, Decreased strength, Difficulty walking, Decreased balance  Visit Diagnosis: Radiculopathy, lumbar region  Muscle weakness (generalized)  History of falling  Chronic pain of left knee     Problem List Patient Active Problem List   Diagnosis Date Noted  . Obese 06/20/2016  . Status post right knee replacement 06/19/2016  . S/P left TKA 06/18/2016  . Spinal stenosis of lumbar region 09/14/2015    MJoneen BoersPT, DPT   02/24/2018, 7:30 PM  [facility] [address] [place] [address]Phone: [phone]  Fax:  [phone] Name: RNEELA ZECCAMRN: [id number] of Birth:  [birth year]

## 2018-02-27 ENCOUNTER — Ambulatory Visit: Payer: Medicare Other

## 2018-03-03 ENCOUNTER — Ambulatory Visit: Payer: Medicare Other | Attending: Orthopedic Surgery

## 2018-03-03 DIAGNOSIS — Z9181 History of falling: Secondary | ICD-10-CM | POA: Diagnosis present

## 2018-03-03 DIAGNOSIS — G8929 Other chronic pain: Secondary | ICD-10-CM

## 2018-03-03 DIAGNOSIS — M5416 Radiculopathy, lumbar region: Secondary | ICD-10-CM

## 2018-03-03 DIAGNOSIS — R42 Dizziness and giddiness: Secondary | ICD-10-CM

## 2018-03-03 DIAGNOSIS — M25562 Pain in left knee: Secondary | ICD-10-CM | POA: Diagnosis present

## 2018-03-03 DIAGNOSIS — M6281 Muscle weakness (generalized): Secondary | ICD-10-CM

## 2018-03-03 NOTE — Patient Instructions (Addendum)
  MedbridgeAccess Code: [email or web address]  Seated Heel Slide   (rolling a ball) 10x3 with 5 second holds    Running man L LE with one UE assist 10x3  Reviewed and given as part of her HEP. Pt demonstrated and verbalized understanding

## 2018-03-03 NOTE — Therapy (Signed)
[facility] [address], [place], [address] Phone: [phone]   Fax:  [phone]  Physical Therapy Treatment  Patient Details  Name: Destiny Shaw MRN: [id number] Date of Birth: [birth year] Referring Provider (PT): Paralee Cancel, MD   Encounter Date: 03/03/2018  PT End of Session - 03/03/18 1102    Visit Number  31    Number of Visits  60    Date for PT Re-Evaluation  05/01/18    Authorization Type  4    Authorization Time Period  of 10 progress note Medicare    PT Start Time  1110    PT Stop Time  1204    PT Time Calculation (min)  54 min    Equipment Utilized During Treatment  Gait belt    Activity Tolerance  Patient tolerated treatment well    Behavior During Therapy  WFL for tasks assessed/performed       Past Medical History:  Diagnosis Date  . ADD (attention deficit disorder)   . Anemia   . Anginal pain ([place])    pt states has occas chest pain relates to indigestion; pt uses rest to relieve   . Anxiety   . Arthritis   . Concussion   . Depression   . Diabetes mellitus without complication ([place])   . Dizziness   . Fall   . GERD (gastroesophageal reflux disease)   . Headache   . History of urinary tract infection   . Hyperlipidemia   . Hypertension   . Hypothyroidism   . IBS (irritable bowel syndrome)   . Imbalance   . Numbness    right leg   . Numbness in both hands    comes and goes   . Pneumonia    last episode approx 1 year ago   . Sleep apnea   . Wears glasses     Past Surgical History:  Procedure Laterality Date  . BREAST CYST ASPIRATION Left 1980's   neg  . BREAST CYST EXCISION Right 1980's   neg  . BREAST LUMPECTOMY Right   . BREAST SURGERY    . CARPAL TUNNEL RELEASE    . CESAREAN SECTION     times 2  . COLONOSCOPY WITH PROPOFOL N/A 05/11/2015   Procedure: COLONOSCOPY WITH PROPOFOL;  Surgeon: Manya Silvas, MD;  Location: [facility] ENDOSCOPY;  Service: Endoscopy;   Laterality: N/A;  . DE QUERVAIN'S RELEASE Right   . ESOPHAGOGASTRODUODENOSCOPY (EGD) WITH PROPOFOL N/A 05/11/2015   Procedure: ESOPHAGOGASTRODUODENOSCOPY (EGD) WITH PROPOFOL;  Surgeon: Manya Silvas, MD;  Location: [facility] ENDOSCOPY;  Service: Endoscopy;  Laterality: N/A;  . EYE SURGERY     laser surgery bilat   . HERNIA REPAIR    . KNEE ARTHROSCOPY    . LUMBAR LAMINECTOMY/DECOMPRESSION MICRODISCECTOMY Bilateral 09/14/2015   Procedure: MICRO LUMBAR BILATERAL DECOMPRESSION L4 - L5;  Surgeon: Susa Day, MD;  Location: [facility] ORS;  Service: Orthopedics;  Laterality: Bilateral;  . REDUCTION MAMMAPLASTY Bilateral 1980  . RHINOPLASTY    . TONSILLECTOMY    . TOTAL KNEE ARTHROPLASTY Left 06/18/2016   Procedure: LEFT TOTAL KNEE ARTHROPLASTY;  Surgeon: Paralee Cancel, MD;  Location: [facility] ORS;  Service: Orthopedics;  Laterality: Left;  Adductor Block  . TUBAL LIGATION    . UVULOPALATOPHARYNGOPLASTY      There were no vitals filed for this visit.  Subjective Assessment - 03/03/18 1111    Subjective  Pt states wanting to finish up with Dorriea, PT. Also  wants her husband to get vestibular PT as well.  L knee is doing well today. Her back is a 5/10 today.  No L knee pain today.  Stepping into and out of her bathtub is going well when she steps straight in and out.  Planning to attach grab bars in the shower. Also wants to be able to get up from the floor.     Pertinent History  Low back pain. Pt states having back surgery about 2-3 years ago. After she had her L TKA, her back started aggravating her due to her walking. Pt also fell about 4 weeks ago onto her L knee. Dr. Alvan Dame checked out her L knee. L knee was fine but has some inflammation.  Pt tries to keep it iced. Pt still recovering for her L TKA but the fall set her back.   Her L knee surgery was last year.  L knee still bothers her a lot.  Had surgery for her back before her L knee surgery. Was doing well until her L knee surgery. The fall made it worse.  Feels debilitating. Also has a hard time getting into and out of the car mainly due to her L knee.   Pt fell due to her L knee bucking on her. Pt was trying to pick something up from the side of the couch.  Also has a hard time getting up from the floor.  Pt states having bladder accidents since after her fall (pt was recommended to tell her MD about it).  Bowel problems since taking medications (was constapated, but after taking medications, pt had diarrhea which was difficult to control. Getting out of the metformin fixed the bowel issue).  Pt states tingling and numbness L lateral LE along the L5 dermatome.  Denies saddle anesthesia.  Pt states that her back problem is mainly on her L side.  Pt landed on her L knee when she fell.  No other falls within the last 6 months. Pt also states being very dizzy since her L knee surgery.  Had PT treatement for her dizziness before which did not help.  The room feels like it is spinning when she gets dizzy.  I feel like it is getting better then she does something.     Patient Stated Goals  Be better able to get into and out of her vehicle (midsized truck), into and out of chairs, be better able to roll in her bed with less L knee pain. Be able to get down on the floor and get back up.     Currently in Pain?  Yes    Pain Score  5     Pain Location  Back    Pain Type  Chronic pain    Pain Onset  More than a month ago         [facility] PT Assessment - 03/03/18 1214      Observation/Other Assessments   Focus on Therapeutic Outcomes (FOTO)   Lumbar FOTO: 47    Modified [place]  36%                           PT Education - 03/03/18 1211    Education Details  ther-ex, HEP, plan of care    Person(s) Educated  Patient    Methods  Explanation;Demonstration;Tactile cues;Verbal cues;Handout    Comprehension  Returned demonstration;Verbalized understanding      Objectives   MedbridgeAccess Code: [email or web address]  Latex free bands  used  Pt also wants to be better able to step over her bath tub to get into her shower.  Pt also adds that she wants to work on floor to stand transfers.   Therapeutic exercise  Seated L knee flexion AROM 109 degrees               Seated L knee flexion AAROM with basketball  10x5 seconds to promote ROM x 3  115 seated L knee flexion AROM afterwards  Reviewed and given as part of her HEP. Pt demonstrated and verbalized understanding.   Seated L knee flexion AAROM 10x with PT assist to end range 10x3             Improved seated L knee flexion AROM to 120 degrees afterwards  Seated L knee flexion resisting red band 5x  Then yellow band 10x  Forward step over 11 inch obstacle (PVC rod on top) 10x each side with one UE assist  Then 13 inch obstacle with one UE assist 5x each LE   Running man L LE with one UE assist 10x3 to promote L glute max muscle strength.   Reviewed and given as part of her HEP. Pt demonstrated and verbalized understanding    Improved exercise technique, movement at target joints, use of target muscles after mod verbal, visual, tactile cues.    No back pain after session. Continued working on improving L knee flexion AROM to promote clearance when stepping over an obstacle to decrease difficulty stepping into and out of her bath tub at home. Back and knee pain levels vary but back pain seems to improve with improving posture, as well as with glute and trunk strengthening. Knee pain seems to improve with improving patellar mobility and femoral control. No back pain after session following exercise to promote L glute max muscle strengthening. Pt also demonstrates overall improved function, and LE strength since initial evaluation as well as overall improved dizziness and balance. Pt still demonstrates low back and L knee pain, LE weakness, and difficulty performing functional tasks such as getting into and out of her bath tub, truck as well as floor to stand transfers  and would benefit from continued skilled physical therapy services to address the aforementioned deficits.          PT Short Term Goals - 12/12/17 0930      PT SHORT TERM GOAL #1   Title  Patient will be independent with her HEP to help decrease back and L knee pain and improve ability to perform functional tasks.     Time  3    Period  Weeks    Status  Achieved    Target Date  12/12/17        PT Long Term Goals - 03/03/18 1116      PT LONG TERM GOAL #1   Title  Patient will have a decrease in back pain to 3/10 or less at worst to promote ability to ambulate, turn in bed, perform standing tasks with less pain.     Baseline  6/10 back pain at most (09/24/2017); 7-8/10 at most for the past 7 days, duration of pain is better since starting PT (10/29/2017); 5-6/10 at most for the past 7 days (11/18/2017); 8/10 at most for the past 7 days but the duration of pain is less compared to prior to starting PT (12/12/2017).  7/10 back pain at most for the past 7 days (pt states doing her exercises  helps decrease her pain; 01/02/2018); walking (3/10), standing (4/10), bed mobility (0/10) 02/05/2018; 6/10 back pain at most for the past 7 days (03/03/2018)    Time  8    Period  Weeks    Status  On-going    Target Date  05/01/18      PT LONG TERM GOAL #2   Title  Patient will have a decrease in L knee pain to 3/10 or less at worst to promote ability to ambulate, negotiate stairs, get into and out of a car more comfortably.     Baseline  6/10 L knee pain at most for the past 3 months (09/24/2017); 7-8/10 at most for the past 7 days, duration of pain is better since starting PT (10/29/2017); 7/10 at most for the past 7 days (11/18/2017); 6/10 L knee pain at most for the past 7 days (12/12/2017); 5/10 L knee pain at most for the past 7 days (pt states doing her exercises helps decrease pain; 01/02/2018); L knee 3/10 (02/05/2018); 4/10 at most for the past 7 days (03/03/2018)    Time  8    Period  Weeks    Status   Partially Met    Target Date  05/01/18      PT LONG TERM GOAL #3   Title  Patient will improve TUG time to 12 seconds or less as a demonstration of improved functional mobility and balance.     Baseline  TUG no AD: 16.05 seconds on average (09/24/2017); 14.3 seconds on average (10/29/2017); 13 seconds average (11/21/2017); 16.67 seconds average (12/12/2017);  14.17 seconds average (01/02/2018); 11.8 sec sonds (02/05/2018)    Time  6    Period  Weeks    Status  Achieved      PT LONG TERM GOAL #4   Title  Patient will improve her back FOTO score by at least 10 points as a demonstration of improved funtion.      Baseline  Back FOTO: 33 (09/24/2017); 38 (10/29/2017); 36 (11/21/2017); 40 (12/12/2017); 42 (01/02/2018); 47 (03/03/2018)    Time  6    Period  Weeks    Status  Achieved    Target Date  02/27/18      PT LONG TERM GOAL #5   Title  Patient will improve bilateral LE strength by at least 1/2 MMT grade to promote ability to perform standing tasks.     Time  6    Period  Weeks    Status  Achieved    Target Date  01/02/18      PT LONG TERM GOAL #6   Title  Patient will improve her Modified Oswestry Low Back pain disablity questionnaire by at least 10% as a demonstration of improved function.     Baseline  48% (09/24/2017); 46% (10/29/2017), (11/21/2017); 56% (12/12/2017); 36% (01/02/2018), (03/03/2018)    Time  6    Period  Weeks    Status  Achieved    Target Date  01/02/18      PT LONG TERM GOAL #7   Title  Pt will report decreased difficulty stepping into and out of her bathtub as well as getting into and out of her truck to promote ability to get into and out of places.     Baseline  Pt reports difficulty stepping into and out of her bathtub as well as difficulty getting into and out of her truck (01/02/2018); Pt states the truck and bath tub and difficult and painful.  (02/05/2018); Improving per subjective  reports (03/03/2018)    Time  8    Period  Weeks    Status  Partially Met    Target Date   05/01/18      PT LONG TERM GOAL #8   Title  Patient will improve her BERG balance test score to 46/56 or more as a demonstration of decreased fall risk     Baseline  42/56 (01/02/2018); 54/56 (02/05/2018)    Time  6    Period  Weeks    Status  Achieved    Target Date  02/27/18      PT LONG TERM GOAL  #9   TITLE  Pt will report minimal to no dizziness related to vertigo to promote balance and decrease fall risk.     Baseline  Pt reports dizziness (related to vertigo) which seem to affect her balance (01/15/2018); Patient reports "a little bit every now and then" (02/05/2018)    Time  6    Period  Weeks    Status  Achieved    Target Date  02/27/18            Plan - 03/03/18 1101    Clinical Impression Statement  No back pain after session. Continued working on improving L knee flexion AROM to promote clearance when stepping over an obstacle to decrease difficulty stepping into and out of her bath tub at home. Back and knee pain levels vary but back pain seems to improve with improving posture, as well as with glute and trunk strengthening. Knee pain seems to improve with improving patellar mobility and femoral control. No back pain after session following exercise to promote L glute max muscle strengthening. Pt also demonstrates overall improved function, and LE strength since initial evaluation as well as overall improved dizziness and balance. Pt still demonstrates low back and L knee pain, LE weakness, and difficulty performing functional tasks such as getting into and out of her bath tub, truck as well as floor to stand transfers and would benefit from continued skilled physical therapy services to address the aforementioned deficits.     History and Personal Factors relevant to plan of care:  Chronic low back pain, L LE radiating symptoms, L knee pain, hx of fall, medical history, LE weakness, difficulty getting into and out of the vehicle, stair negotiation    Clinical Presentation   Stable    Clinical Presentation due to:  Pt making progress with PT towards goals    Clinical Decision Making  Low    Rehab Potential  Fair    Clinical Impairments Affecting Rehab Potential  (-) chronicity of condition, multiple areas of pain, medical history, age; (+) motivated, husband support    PT Frequency  2x / week    PT Duration  8 weeks    PT Treatment/Interventions  Therapeutic activities;Therapeutic exercise;Balance training;Neuromuscular re-education;Patient/family education;Manual techniques;Dry needling;Aquatic Therapy;Electrical Stimulation;Iontophoresis 74m/ml Dexamethasone;Gait training;Canalith Repostioning;Vestibular    PT Next Visit Plan  canalith repositioning, hip and knee strengthening, core strengthening, patellar mobility, manual techniques, modalities PRN    Consulted and Agree with Plan of Care  Patient       Patient will benefit from skilled therapeutic intervention in order to improve the following deficits and impairments:  Pain, Improper body mechanics, Postural dysfunction, Dizziness, Decreased strength, Difficulty walking, Decreased balance  Visit Diagnosis: Radiculopathy, lumbar region - Plan: PT plan of care cert/re-cert  Muscle weakness (generalized) - Plan: PT plan of care cert/re-cert  History of falling - Plan: PT plan of  care cert/re-cert  Chronic pain of left knee - Plan: PT plan of care cert/re-cert  Dizziness and giddiness - Plan: PT plan of care cert/re-cert     Problem List Patient Active Problem List   Diagnosis Date Noted  . Obese 06/20/2016  . Status post right knee replacement 06/19/2016  . S/P left TKA 06/18/2016  . Spinal stenosis of lumbar region 09/14/2015    Joneen Boers PT, DPT   03/03/2018, 12:48 PM  [facility] [address] [place], [address] Phone: [phone]   Fax:  [phone]  Name: SHANAI LARTIGUE MRN: [id number] Date of Birth:  Jun 18, 1950

## 2018-03-05 ENCOUNTER — Ambulatory Visit: Payer: Medicare Other

## 2018-03-05 DIAGNOSIS — M6281 Muscle weakness (generalized): Secondary | ICD-10-CM

## 2018-03-05 DIAGNOSIS — M5416 Radiculopathy, lumbar region: Secondary | ICD-10-CM | POA: Diagnosis not present

## 2018-03-05 DIAGNOSIS — Z9181 History of falling: Secondary | ICD-10-CM

## 2018-03-05 NOTE — Therapy (Signed)
[facility] [address] [place], [address] Phone: [phone]   Fax:  [phone]  Physical Therapy Treatment  Patient Details  Name: Destiny Shaw MRN: [id number] Date of Birth: 16-Aug-1950 Referring Provider (PT): Paralee Cancel, MD   Encounter Date: 03/05/2018  PT End of Session - 03/05/18 1434    Visit Number  32    Number of Visits  60    Date for PT Re-Evaluation  05/01/18    Authorization Type  5    Authorization Time Period  of 10 progress note Medicare    PT Start Time  1027    PT Stop Time  1517    PT Time Calculation (min)  43 min    Equipment Utilized During Treatment  Gait belt    Activity Tolerance  Patient tolerated treatment well    Behavior During Therapy  WFL for tasks assessed/performed       Past Medical History:  Diagnosis Date  . ADD (attention deficit disorder)   . Anemia   . Anginal pain ([place])    pt states has occas chest pain relates to indigestion; pt uses rest to relieve   . Anxiety   . Arthritis   . Concussion   . Depression   . Diabetes mellitus without complication ([place])   . Dizziness   . Fall   . GERD (gastroesophageal reflux disease)   . Headache   . History of urinary tract infection   . Hyperlipidemia   . Hypertension   . Hypothyroidism   . IBS (irritable bowel syndrome)   . Imbalance   . Numbness    right leg   . Numbness in both hands    comes and goes   . Pneumonia    last episode approx 1 year ago   . Sleep apnea   . Wears glasses     Past Surgical History:  Procedure Laterality Date  . BREAST CYST ASPIRATION Left 1980's   neg  . BREAST CYST EXCISION Right 1980's   neg  . BREAST LUMPECTOMY Right   . BREAST SURGERY    . CARPAL TUNNEL RELEASE    . CESAREAN SECTION     times 2  . COLONOSCOPY WITH PROPOFOL N/A 05/11/2015   Procedure: COLONOSCOPY WITH PROPOFOL;  Surgeon: Manya Silvas, MD;  Location: [facility] ENDOSCOPY;  Service: Endoscopy;   Laterality: N/A;  . DE QUERVAIN'S RELEASE Right   . ESOPHAGOGASTRODUODENOSCOPY (EGD) WITH PROPOFOL N/A 05/11/2015   Procedure: ESOPHAGOGASTRODUODENOSCOPY (EGD) WITH PROPOFOL;  Surgeon: Manya Silvas, MD;  Location: [facility] ENDOSCOPY;  Service: Endoscopy;  Laterality: N/A;  . EYE SURGERY     laser surgery bilat   . HERNIA REPAIR    . KNEE ARTHROSCOPY    . LUMBAR LAMINECTOMY/DECOMPRESSION MICRODISCECTOMY Bilateral 09/14/2015   Procedure: MICRO LUMBAR BILATERAL DECOMPRESSION L4 - L5;  Surgeon: Susa Day, MD;  Location: [facility] ORS;  Service: Orthopedics;  Laterality: Bilateral;  . REDUCTION MAMMAPLASTY Bilateral 1980  . RHINOPLASTY    . TONSILLECTOMY    . TOTAL KNEE ARTHROPLASTY Left 06/18/2016   Procedure: LEFT TOTAL KNEE ARTHROPLASTY;  Surgeon: Paralee Cancel, MD;  Location: [facility] ORS;  Service: Orthopedics;  Laterality: Left;  Adductor Block  . TUBAL LIGATION    . UVULOPALATOPHARYNGOPLASTY      There were no vitals filed for this visit.  Subjective Assessment - 03/05/18 1435    Subjective  Doing better with her back today compared to last night.  Did a lot of exercises last night. Did the seated trunk flexion rolling the ball and it helped her back.  5/10 back pain currently. 0/10 L knee pain currently.     Pertinent History  Low back pain. Pt states having back surgery about 2-3 years ago. After she had her L TKA, her back started aggravating her due to her walking. Pt also fell about 4 weeks ago onto her L knee. Dr. Alvan Dame checked out her L knee. L knee was fine but has some inflammation.  Pt tries to keep it iced. Pt still recovering for her L TKA but the fall set her back.   Her L knee surgery was last year.  L knee still bothers her a lot.  Had surgery for her back before her L knee surgery. Was doing well until her L knee surgery. The fall made it worse. Feels debilitating. Also has a hard time getting into and out of the car mainly due to her L knee.   Pt fell due to her L knee bucking on her. Pt  was trying to pick something up from the side of the couch.  Also has a hard time getting up from the floor.  Pt states having bladder accidents since after her fall (pt was recommended to tell her MD about it).  Bowel problems since taking medications (was constapated, but after taking medications, pt had diarrhea which was difficult to control. Getting out of the metformin fixed the bowel issue).  Pt states tingling and numbness L lateral LE along the L5 dermatome.  Denies saddle anesthesia.  Pt states that her back problem is mainly on her L side.  Pt landed on her L knee when she fell.  No other falls within the last 6 months. Pt also states being very dizzy since her L knee surgery.  Had PT treatement for her dizziness before which did not help.  The room feels like it is spinning when she gets dizzy.  I feel like it is getting better then she does something.     Patient Stated Goals  Be better able to get into and out of her vehicle (midsized truck), into and out of chairs, be better able to roll in her bed with less L knee pain. Be able to get down on the floor and get back up.     Currently in Pain?  Yes    Pain Score  5     Pain Onset  More than a month ago                               PT Education - 03/05/18 1442    Education Details  ther-ex    Person(s) Educated  Patient    Methods  Explanation;Demonstration;Tactile cues;Verbal cues    Comprehension  Returned demonstration;Verbalized understanding      Objectives   MedbridgeAccess Code: [email or web address]   Latex free bands used  Pt also wants to be better able to step over her bath tub to get into her shower.  Pt also adds that she wants to work on floor to stand transfers.  Sitting posture: L trunk rotation   Therapeutic exercise  Seated manually resisted R trunk rotation in neutral 10x5 seconds for 3 sets  Decreased back pain  Seated trunk flexion isometrics 10x5 seconds for 3 sets  No  back pain after the aforementioned exercises.   Sitting with eyes closed: feeling  a sense of floating  Seated chin tucks 10x5 seconds for 3 sets  Decreased L lateral neck tightness. Decreased neck tightness.    Decreased feeling of floating or dizziness with eyes closed  Reviewed seated B shoulder extension isometrics to be performed every day to promote trunk strength and decrease back pain,    Improved exercise technique, movement at target joints, use of target muscles after mod verbal, visual, tactile cues.    Manual therapy   Seated STM cervical paraspinal muscles and R upper trap muscle to decrease tension.   No unsteady feeling with eyes closed.   Decreased low back pain with exercises to decrease L trunk rotation and lumbar extension with trunk muscle activation. Decreased dizziness with eyes closed with treatment to decrease neck tension. Pt seems to be improving with L knee pain based on subjective reports of no L knee pain today. Pt will benefit from continued skilled physical therapy services to decrease pain, improve strength and balance and improve function.     PT Short Term Goals - 12/12/17 0930      PT SHORT TERM GOAL #1   Title  Patient will be independent with her HEP to help decrease back and L knee pain and improve ability to perform functional tasks.     Time  3    Period  Weeks    Status  Achieved    Target Date  12/12/17        PT Long Term Goals - 03/03/18 1116      PT LONG TERM GOAL #1   Title  Patient will have a decrease in back pain to 3/10 or less at worst to promote ability to ambulate, turn in bed, perform standing tasks with less pain.     Baseline  6/10 back pain at most (09/24/2017); 7-8/10 at most for the past 7 days, duration of pain is better since starting PT (10/29/2017); 5-6/10 at most for the past 7 days (11/18/2017); 8/10 at most for the past 7 days but the duration of pain is less compared to prior to starting PT (12/12/2017).  7/10  back pain at most for the past 7 days (pt states doing her exercises helps decrease her pain; 01/02/2018); walking (3/10), standing (4/10), bed mobility (0/10) 02/05/2018; 6/10 back pain at most for the past 7 days (03/03/2018)    Time  8    Period  Weeks    Status  On-going    Target Date  05/01/18      PT LONG TERM GOAL #2   Title  Patient will have a decrease in L knee pain to 3/10 or less at worst to promote ability to ambulate, negotiate stairs, get into and out of a car more comfortably.     Baseline  6/10 L knee pain at most for the past 3 months (09/24/2017); 7-8/10 at most for the past 7 days, duration of pain is better since starting PT (10/29/2017); 7/10 at most for the past 7 days (11/18/2017); 6/10 L knee pain at most for the past 7 days (12/12/2017); 5/10 L knee pain at most for the past 7 days (pt states doing her exercises helps decrease pain; 01/02/2018); L knee 3/10 (02/05/2018); 4/10 at most for the past 7 days (03/03/2018)    Time  8    Period  Weeks    Status  Partially Met    Target Date  05/01/18      PT LONG TERM GOAL #3   Title  Patient will improve TUG time to 12 seconds or less as a demonstration of improved functional mobility and balance.     Baseline  TUG no AD: 16.05 seconds on average (09/24/2017); 14.3 seconds on average (10/29/2017); 13 seconds average (11/21/2017); 16.67 seconds average (12/12/2017);  14.17 seconds average (01/02/2018); 11.8 sec sonds (02/05/2018)    Time  6    Period  Weeks    Status  Achieved      PT LONG TERM GOAL #4   Title  Patient will improve her back FOTO score by at least 10 points as a demonstration of improved funtion.      Baseline  Back FOTO: 33 (09/24/2017); 38 (10/29/2017); 36 (11/21/2017); 40 (12/12/2017); 42 (01/02/2018); 47 (03/03/2018)    Time  6    Period  Weeks    Status  Achieved    Target Date  02/27/18      PT LONG TERM GOAL #5   Title  Patient will improve bilateral LE strength by at least 1/2 MMT grade to promote ability to perform  standing tasks.     Time  6    Period  Weeks    Status  Achieved    Target Date  01/02/18      PT LONG TERM GOAL #6   Title  Patient will improve her Modified Oswestry Low Back pain disablity questionnaire by at least 10% as a demonstration of improved function.     Baseline  48% (09/24/2017); 46% (10/29/2017), (11/21/2017); 56% (12/12/2017); 36% (01/02/2018), (03/03/2018)    Time  6    Period  Weeks    Status  Achieved    Target Date  01/02/18      PT LONG TERM GOAL #7   Title  Pt will report decreased difficulty stepping into and out of her bathtub as well as getting into and out of her truck to promote ability to get into and out of places.     Baseline  Pt reports difficulty stepping into and out of her bathtub as well as difficulty getting into and out of her truck (01/02/2018); Pt states the truck and bath tub and difficult and painful.  (02/05/2018); Improving per subjective reports (03/03/2018)    Time  8    Period  Weeks    Status  Partially Met    Target Date  05/01/18      PT LONG TERM GOAL #8   Title  Patient will improve her BERG balance test score to 46/56 or more as a demonstration of decreased fall risk     Baseline  42/56 (01/02/2018); 54/56 (02/05/2018)    Time  6    Period  Weeks    Status  Achieved    Target Date  02/27/18      PT LONG TERM GOAL  #9   TITLE  Pt will report minimal to no dizziness related to vertigo to promote balance and decrease fall risk.     Baseline  Pt reports dizziness (related to vertigo) which seem to affect her balance (01/15/2018); Patient reports "a little bit every now and then" (02/05/2018)    Time  6    Period  Weeks    Status  Achieved    Target Date  02/27/18            Plan - 03/05/18 1445    Clinical Impression Statement  Decreased low back pain with exercises to decrease L trunk rotation and lumbar extension with trunk muscle activation. Decreased  dizziness with eyes closed with treatment to decrease neck tension. Pt seems to  be improving with L knee pain based on subjective reports of no L knee pain today. Pt will benefit from continued skilled physical therapy services to decrease pain, improve strength and balance and improve function.     Rehab Potential  Fair    Clinical Impairments Affecting Rehab Potential  (-) chronicity of condition, multiple areas of pain, medical history, age; (+) motivated, husband support    PT Frequency  2x / week    PT Duration  8 weeks    PT Treatment/Interventions  Therapeutic activities;Therapeutic exercise;Balance training;Neuromuscular re-education;Patient/family education;Manual techniques;Dry needling;Aquatic Therapy;Electrical Stimulation;Iontophoresis 64m/ml Dexamethasone;Gait training;Canalith Repostioning;Vestibular    PT Next Visit Plan  canalith repositioning, hip and knee strengthening, core strengthening, patellar mobility, manual techniques, modalities PRN    Consulted and Agree with Plan of Care  Patient       Patient will benefit from skilled therapeutic intervention in order to improve the following deficits and impairments:  Pain, Improper body mechanics, Postural dysfunction, Dizziness, Decreased strength, Difficulty walking, Decreased balance  Visit Diagnosis: Radiculopathy, lumbar region  Muscle weakness (generalized)  History of falling     Problem List Patient Active Problem List   Diagnosis Date Noted  . Obese 06/20/2016  . Status post right knee replacement 06/19/2016  . S/P left TKA 06/18/2016  . Spinal stenosis of lumbar region 09/14/2015    MJoneen BoersPT, DPT   03/05/2018, 7:51 PM  [facility] [address] [place] [address]Phone: [phone]  Fax:  [phone] Name: RANDILYN BETTCHERMRN: [id number] of Birth: 4December 11, 1952

## 2018-03-10 ENCOUNTER — Ambulatory Visit: Payer: Medicare Other

## 2018-03-10 DIAGNOSIS — R42 Dizziness and giddiness: Secondary | ICD-10-CM

## 2018-03-10 DIAGNOSIS — M5416 Radiculopathy, lumbar region: Secondary | ICD-10-CM | POA: Diagnosis not present

## 2018-03-10 DIAGNOSIS — Z9181 History of falling: Secondary | ICD-10-CM

## 2018-03-10 NOTE — Therapy (Addendum)
[facility] [address], [place], [address] Phone: [phone]   Fax:  [phone]  Physical Therapy Treatment  Patient Details  Name: Destiny Shaw MRN: [id number] Date of Birth: Oct 24, 1950 Referring Provider (PT): Paralee Cancel, MD   Encounter Date: 03/10/2018  PT End of Session - 03/10/18 1443    Visit Number  33    Number of Visits  60    Date for PT Re-Evaluation  05/01/18    Authorization Type  6    Authorization Time Period  of 10 progress note Medicare    PT Start Time  1402    PT Stop Time  1437    PT Time Calculation (min)  35 min    Equipment Utilized During Treatment  Gait belt    Activity Tolerance  Patient tolerated treatment well    Behavior During Therapy  WFL for tasks assessed/performed       Past Medical History:  Diagnosis Date  . ADD (attention deficit disorder)   . Anemia   . Anginal pain ([place])    pt states has occas chest pain relates to indigestion; pt uses rest to relieve   . Anxiety   . Arthritis   . Concussion   . Depression   . Diabetes mellitus without complication ([place])   . Dizziness   . Fall   . GERD (gastroesophageal reflux disease)   . Headache   . History of urinary tract infection   . Hyperlipidemia   . Hypertension   . Hypothyroidism   . IBS (irritable bowel syndrome)   . Imbalance   . Numbness    right leg   . Numbness in both hands    comes and goes   . Pneumonia    last episode approx 1 year ago   . Sleep apnea   . Wears glasses     Past Surgical History:  Procedure Laterality Date  . BREAST CYST ASPIRATION Left 1980's   neg  . BREAST CYST EXCISION Right 1980's   neg  . BREAST LUMPECTOMY Right   . BREAST SURGERY    . CARPAL TUNNEL RELEASE    . CESAREAN SECTION     times 2  . COLONOSCOPY WITH PROPOFOL N/A 05/11/2015   Procedure: COLONOSCOPY WITH PROPOFOL;  Surgeon: Manya Silvas, MD;  Location: [facility] ENDOSCOPY;  Service: Endoscopy;   Laterality: N/A;  . DE QUERVAIN'S RELEASE Right   . ESOPHAGOGASTRODUODENOSCOPY (EGD) WITH PROPOFOL N/A 05/11/2015   Procedure: ESOPHAGOGASTRODUODENOSCOPY (EGD) WITH PROPOFOL;  Surgeon: Manya Silvas, MD;  Location: [facility] ENDOSCOPY;  Service: Endoscopy;  Laterality: N/A;  . EYE SURGERY     laser surgery bilat   . HERNIA REPAIR    . KNEE ARTHROSCOPY    . LUMBAR LAMINECTOMY/DECOMPRESSION MICRODISCECTOMY Bilateral 09/14/2015   Procedure: MICRO LUMBAR BILATERAL DECOMPRESSION L4 - L5;  Surgeon: Susa Day, MD;  Location: [facility] ORS;  Service: Orthopedics;  Laterality: Bilateral;  . REDUCTION MAMMAPLASTY Bilateral 1980  . RHINOPLASTY    . TONSILLECTOMY    . TOTAL KNEE ARTHROPLASTY Left 06/18/2016   Procedure: LEFT TOTAL KNEE ARTHROPLASTY;  Surgeon: Paralee Cancel, MD;  Location: [facility] ORS;  Service: Orthopedics;  Laterality: Left;  Adductor Block  . TUBAL LIGATION    . UVULOPALATOPHARYNGOPLASTY      There were no vitals filed for this visit.  Subjective Assessment - 03/10/18 1636    Subjective   Patient reports that she has been doing really well.  Patient states she does have a little dizziness but not much. Patient reports she has not had any further episodes of vertigo. Patient states she has not had any further falls, but states she does get off balance at times.     Patient is accompained by:  Family member    Pertinent History  Low back pain. Pt states having back surgery about 2-3 years ago. After she had her L TKA, her back started aggravating her due to her walking. Pt also fell about 4 weeks ago onto her L knee. Dr. Alvan Dame checked out her L knee. L knee was fine but has some inflammation.  Pt tries to keep it iced. Pt still recovering for her L TKA but the fall set her back.   Her L knee surgery was last year.  L knee still bothers her a lot.  Had surgery for her back before her L knee surgery. Was doing well until her L knee surgery. The fall made it worse. Feels debilitating. Also has a hard time  getting into and out of the car mainly due to her L knee.   Pt fell due to her L knee bucking on her. Pt was trying to pick something up from the side of the couch.  Also has a hard time getting up from the floor.  Pt states having bladder accidents since after her fall (pt was recommended to tell her MD about it).  Bowel problems since taking medications (was constapated, but after taking medications, pt had diarrhea which was difficult to control. Getting out of the metformin fixed the bowel issue).  Pt states tingling and numbness L lateral LE along the L5 dermatome.  Denies saddle anesthesia.  Pt states that her back problem is mainly on her L side.  Pt landed on her L knee when she fell.  No other falls within the last 6 months. Pt also states being very dizzy since her L knee surgery.  Had PT treatement for her dizziness before which did not help.  The room feels like it is spinning when she gets dizzy.  I feel like it is getting better then she does something.     Patient Stated Goals  Be better able to get into and out of her vehicle (midsized truck), into and out of chairs, be better able to roll in her bed with less L knee pain. Be able to get down on the floor and get back up.     Currently in Pain?  No/denies    Pain Onset  More than a month ago        Neuromuscular Re-education:  On mat table, performed left Dix-Hallpike testing and it was negative with no nystagmus observed and patient denied vertigo.  Performed right Dix-Hallpike test and it was negative with no nystagmus observed and patient denied vertigo.  Patient and her husband expressed that they would like to learn the self-Epley maneuver should patient's BPPV symptoms return. Issued handout of self-Epley maneuver and reviewed with patient and her husband. Patient did not have his reading glasses so he observed during the session.  Patient performed self-Epley maneuver on mat table using pillow across shoulder blade level with head  supported on mat table with assistance of therapist secondary to patient with limited cervical extension so supported patient's head. Performed the first time with therapist providing cuing and then repeated with patient utilizing the home exercise program instruction sheet to perform the maneuver.  Patient demonstrated and verbalized understanding  of self-Epley maneuver and patient's husband verbalized understanding of self-Epley maneuver. Discussed incidence of reoccurrence rates and discussed that if patient experienced the same symptoms of episodes of vertigo brought about by changes in position with the vertigo lasting 30 to 60 seconds that she could try the self-Epley to see if this recreated her dizziness and her husband was able to view the nystagmus. If symptoms did not subside, encouraged patient to seek vestibular therapy again and to inform her physician.    PT Education - 03/10/18 1636    Education Details  educated as to self-Epley canalith repositioning maneuver    Person(s) Educated  Patient;Spouse    Methods  Explanation;Demonstration;Verbal cues;Handout;Tactile cues    Comprehension  Verbalized understanding;Returned demonstration       PT Short Term Goals - 12/12/17 0930      PT SHORT TERM GOAL #1   Title  Patient will be independent with her HEP to help decrease back and L knee pain and improve ability to perform functional tasks.     Time  3    Period  Weeks    Status  Achieved    Target Date  12/12/17        PT Long Term Goals - 03/03/18 1116      PT LONG TERM GOAL #1   Title  Patient will have a decrease in back pain to 3/10 or less at worst to promote ability to ambulate, turn in bed, perform standing tasks with less pain.     Baseline  6/10 back pain at most (09/24/2017); 7-8/10 at most for the past 7 days, duration of pain is better since starting PT (10/29/2017); 5-6/10 at most for the past 7 days (11/18/2017); 8/10 at most for the past 7 days but the duration of  pain is less compared to prior to starting PT (12/12/2017).  7/10 back pain at most for the past 7 days (pt states doing her exercises helps decrease her pain; 01/02/2018); walking (3/10), standing (4/10), bed mobility (0/10) 02/05/2018; 6/10 back pain at most for the past 7 days (03/03/2018)    Time  8    Period  Weeks    Status  On-going    Target Date  05/01/18      PT LONG TERM GOAL #2   Title  Patient will have a decrease in L knee pain to 3/10 or less at worst to promote ability to ambulate, negotiate stairs, get into and out of a car more comfortably.     Baseline  6/10 L knee pain at most for the past 3 months (09/24/2017); 7-8/10 at most for the past 7 days, duration of pain is better since starting PT (10/29/2017); 7/10 at most for the past 7 days (11/18/2017); 6/10 L knee pain at most for the past 7 days (12/12/2017); 5/10 L knee pain at most for the past 7 days (pt states doing her exercises helps decrease pain; 01/02/2018); L knee 3/10 (02/05/2018); 4/10 at most for the past 7 days (03/03/2018)    Time  8    Period  Weeks    Status  Partially Met    Target Date  05/01/18      PT LONG TERM GOAL #3   Title  Patient will improve TUG time to 12 seconds or less as a demonstration of improved functional mobility and balance.     Baseline  TUG no AD: 16.05 seconds on average (09/24/2017); 14.3 seconds on average (10/29/2017); 13 seconds average (11/21/2017); 16.67  seconds average (12/12/2017);  14.17 seconds average (01/02/2018); 11.8 sec sonds (02/05/2018)    Time  6    Period  Weeks    Status  Achieved      PT LONG TERM GOAL #4   Title  Patient will improve her back FOTO score by at least 10 points as a demonstration of improved funtion.      Baseline  Back FOTO: 33 (09/24/2017); 38 (10/29/2017); 36 (11/21/2017); 40 (12/12/2017); 42 (01/02/2018); 47 (03/03/2018)    Time  6    Period  Weeks    Status  Achieved    Target Date  02/27/18      PT LONG TERM GOAL #5   Title  Patient will improve bilateral  LE strength by at least 1/2 MMT grade to promote ability to perform standing tasks.     Time  6    Period  Weeks    Status  Achieved    Target Date  01/02/18      PT LONG TERM GOAL #6   Title  Patient will improve her Modified Oswestry Low Back pain disablity questionnaire by at least 10% as a demonstration of improved function.     Baseline  48% (09/24/2017); 46% (10/29/2017), (11/21/2017); 56% (12/12/2017); 36% (01/02/2018), (03/03/2018)    Time  6    Period  Weeks    Status  Achieved    Target Date  01/02/18      PT LONG TERM GOAL #7   Title  Pt will report decreased difficulty stepping into and out of her bathtub as well as getting into and out of her truck to promote ability to get into and out of places.     Baseline  Pt reports difficulty stepping into and out of her bathtub as well as difficulty getting into and out of her truck (01/02/2018); Pt states the truck and bath tub and difficult and painful.  (02/05/2018); Improving per subjective reports (03/03/2018)    Time  8    Period  Weeks    Status  Partially Met    Target Date  05/01/18      PT LONG TERM GOAL #8   Title  Patient will improve her BERG balance test score to 46/56 or more as a demonstration of decreased fall risk     Baseline  42/56 (01/02/2018); 54/56 (02/05/2018)    Time  6    Period  Weeks    Status  Achieved    Target Date  02/27/18      PT LONG TERM GOAL  #9   TITLE  Pt will report minimal to no dizziness related to vertigo to promote balance and decrease fall risk.     Baseline  Pt reports dizziness (related to vertigo) which seem to affect her balance (01/15/2018); Patient reports "a little bit every now and then" (02/05/2018)    Time  6    Period  Weeks    Status  Achieved    Target Date  02/27/18            Plan - 03/10/18 1637    Clinical Impression Statement  Patient seen for repeat Dix-Hallpike testing this date and left and right ears were negative for BPPV. Patient reports overall improved  symptoms with no further vertigo following Epley maneuver performed at prior PT session. Patient and her husband expressed that they would like to be educated as to how to perform self-Epley therefore, educated as to self-Epley this date and handout  materials provided. Patient would benefit from continued PT services by primary therapist to continue to work on goals and functional deficits as indicated on plan of care. Encouraged patient to follow-up as indicated.      Rehab Potential  Fair    Clinical Impairments Affecting Rehab Potential  (-) chronicity of condition, multiple areas of pain, medical history, age; (+) motivated, husband support    PT Frequency  2x / week    PT Duration  8 weeks    PT Treatment/Interventions  Therapeutic activities;Therapeutic exercise;Balance training;Neuromuscular re-education;Patient/family education;Manual techniques;Dry needling;Aquatic Therapy;Electrical Stimulation;Iontophoresis 87m/ml Dexamethasone;Gait training;Canalith Repostioning;Vestibular    PT Next Visit Plan  canalith repositioning, hip and knee strengthening, core strengthening, patellar mobility, manual techniques, modalities PRN    Consulted and Agree with Plan of Care  Patient       Patient will benefit from skilled therapeutic intervention in order to improve the following deficits and impairments:  Pain, Improper body mechanics, Postural dysfunction, Dizziness, Decreased strength, Difficulty walking, Decreased balance  Visit Diagnosis: History of falling  Dizziness and giddiness     Problem List Patient Active Problem List   Diagnosis Date Noted  . Obese 06/20/2016  . Status post right knee replacement 06/19/2016  . S/P left TKA 06/18/2016  . Spinal stenosis of lumbar region 09/14/2015     DLady DeutscherPT, DPT #[phone]  MJoneen BoersPT, DPT  03/11/2018, 12:17 PM  [facility] [address] [place]  [address]Phone: [phone]  Fax:  [phone] Name: Destiny PARADAMRN: [id number] of Birth: 411-20-1952

## 2018-03-11 ENCOUNTER — Ambulatory Visit: Payer: Medicare Other

## 2018-03-11 DIAGNOSIS — M5416 Radiculopathy, lumbar region: Secondary | ICD-10-CM | POA: Diagnosis not present

## 2018-03-11 DIAGNOSIS — Z9181 History of falling: Secondary | ICD-10-CM

## 2018-03-11 DIAGNOSIS — M25562 Pain in left knee: Secondary | ICD-10-CM

## 2018-03-11 DIAGNOSIS — M6281 Muscle weakness (generalized): Secondary | ICD-10-CM

## 2018-03-11 DIAGNOSIS — G8929 Other chronic pain: Secondary | ICD-10-CM

## 2018-03-11 NOTE — Therapy (Signed)
[facility] [address] [place], [address] Phone: [phone]   Fax:  [phone]  Physical Therapy Treatment  Patient Details  Name: Destiny Shaw MRN: [id number] Date of Birth: 1950/04/08 Referring Provider (PT): Paralee Cancel, MD   Encounter Date: 03/11/2018  PT End of Session - 03/11/18 1259    Visit Number  34    Number of Visits  60    Date for PT Re-Evaluation  05/01/18    Authorization Type  7    Authorization Time Period  of 10 progress note Medicare    PT Start Time  1300    PT Stop Time  1347    PT Time Calculation (min)  47 min    Equipment Utilized During Treatment  Gait belt    Activity Tolerance  Patient tolerated treatment well    Behavior During Therapy  WFL for tasks assessed/performed       Past Medical History:  Diagnosis Date  . ADD (attention deficit disorder)   . Anemia   . Anginal pain ([place])    pt states has occas chest pain relates to indigestion; pt uses rest to relieve   . Anxiety   . Arthritis   . Concussion   . Depression   . Diabetes mellitus without complication ([place])   . Dizziness   . Fall   . GERD (gastroesophageal reflux disease)   . Headache   . History of urinary tract infection   . Hyperlipidemia   . Hypertension   . Hypothyroidism   . IBS (irritable bowel syndrome)   . Imbalance   . Numbness    right leg   . Numbness in both hands    comes and goes   . Pneumonia    last episode approx 1 year ago   . Sleep apnea   . Wears glasses     Past Surgical History:  Procedure Laterality Date  . BREAST CYST ASPIRATION Left 1980's   neg  . BREAST CYST EXCISION Right 1980's   neg  . BREAST LUMPECTOMY Right   . BREAST SURGERY    . CARPAL TUNNEL RELEASE    . CESAREAN SECTION     times 2  . COLONOSCOPY WITH PROPOFOL N/A 05/11/2015   Procedure: COLONOSCOPY WITH PROPOFOL;  Surgeon: Manya Silvas, MD;  Location: [facility] ENDOSCOPY;  Service: Endoscopy;   Laterality: N/A;  . DE QUERVAIN'S RELEASE Right   . ESOPHAGOGASTRODUODENOSCOPY (EGD) WITH PROPOFOL N/A 05/11/2015   Procedure: ESOPHAGOGASTRODUODENOSCOPY (EGD) WITH PROPOFOL;  Surgeon: Manya Silvas, MD;  Location: [facility] ENDOSCOPY;  Service: Endoscopy;  Laterality: N/A;  . EYE SURGERY     laser surgery bilat   . HERNIA REPAIR    . KNEE ARTHROSCOPY    . LUMBAR LAMINECTOMY/DECOMPRESSION MICRODISCECTOMY Bilateral 09/14/2015   Procedure: MICRO LUMBAR BILATERAL DECOMPRESSION L4 - L5;  Surgeon: Susa Day, MD;  Location: [facility] ORS;  Service: Orthopedics;  Laterality: Bilateral;  . REDUCTION MAMMAPLASTY Bilateral 1980  . RHINOPLASTY    . TONSILLECTOMY    . TOTAL KNEE ARTHROPLASTY Left 06/18/2016   Procedure: LEFT TOTAL KNEE ARTHROPLASTY;  Surgeon: Paralee Cancel, MD;  Location: [facility] ORS;  Service: Orthopedics;  Laterality: Left;  Adductor Block  . TUBAL LIGATION    . UVULOPALATOPHARYNGOPLASTY      There were no vitals filed for this visit.  Subjective Assessment - 03/11/18 1303    Subjective  Patient reports that overall her knee is so much better.  Her main complaint is her back and her neck.     Pertinent History  Low back pain. Pt states having back surgery about 2-3 years ago. After she had her L TKA, her back started aggravating her due to her walking. Pt also fell about 4 weeks ago onto her L knee. Dr. Alvan Dame checked out her L knee. L knee was fine but has some inflammation.  Pt tries to keep it iced. Pt still recovering for her L TKA but the fall set her back.   Her L knee surgery was last year.  L knee still bothers her a lot.  Had surgery for her back before her L knee surgery. Was doing well until her L knee surgery. The fall made it worse. Feels debilitating. Also has a hard time getting into and out of the car mainly due to her L knee.   Pt fell due to her L knee bucking on her. Pt was trying to pick something up from the side of the couch.  Also has a hard time getting up from the floor.  Pt  states having bladder accidents since after her fall (pt was recommended to tell her MD about it).  Bowel problems since taking medications (was constapated, but after taking medications, pt had diarrhea which was difficult to control. Getting out of the metformin fixed the bowel issue).  Pt states tingling and numbness L lateral LE along the L5 dermatome.  Denies saddle anesthesia.  Pt states that her back problem is mainly on her L side.  Pt landed on her L knee when she fell.  No other falls within the last 6 months. Pt also states being very dizzy since her L knee surgery.  Had PT treatement for her dizziness before which did not help.  The room feels like it is spinning when she gets dizzy.  I feel like it is getting better then she does something.     Patient Stated Goals  Be better able to get into and out of her vehicle (midsized truck), into and out of chairs, be better able to roll in her bed with less L knee pain. Be able to get down on the floor and get back up.     Currently in Pain?  Yes    Pain Score  5     Pain Location  Back    Pain Descriptors / Indicators  Tender    Pain Type  Chronic pain    Pain Onset  More than a month ago       Medbridge Access Code: [email or web address]    Latex free bands used   Pt also wants to be better able to step over her bath tub to get into her shower.    Pt also adds that she wants to work on floor to stand transfers.    Sitting posture: L trunk rotation   Therapeutic exercise   Seated manually resisted R trunk rotation in neutral 15x5 seconds           no increased pain  Seated trunk flexion isometrics 10x5 seconds for 3 sets             No back pain after the aforementioned exercises.    Seated chin tucks 10x5 seconds for 3 sets with visual and verbal tactile cues  Seated scapular retraction 10x3 with tactile cues, less activation noted on L   Reviewed seated B shoulder extension isometrics to be performed every day to promote trunk strength  and  decrease back pain 10x3sec hold resistance from PT   Improved exercise technique, movement at target joints, use of target muscles after mod verbal, visual, tactile cues.    Functional Activities Fall recovery strategies x2 rounds, verbal/tactile/visual cues throughout, decreased cues needed second round. Pt anxious throughout but safe, CGA with gait belt used. Pt complained of L knee pain, but stated it was manageable and eager to repeat steps. Would benefit from further instruction.  Manual Therapy    Seated STM cervical paraspinal muscles and R + L upper trap muscle to decrease tension.     PT Education - 03/11/18 1258    Education Details  ther-ex    Person(s) Educated  Patient    Methods  Explanation;Demonstration;Tactile cues;Verbal cues;Handout    Comprehension  Verbalized understanding;Returned demonstration       PT Short Term Goals - 12/12/17 0930      PT SHORT TERM GOAL #1   Title  Patient will be independent with her HEP to help decrease back and L knee pain and improve ability to perform functional tasks.     Time  3    Period  Weeks    Status  Achieved    Target Date  12/12/17        PT Long Term Goals - 03/03/18 1116      PT LONG TERM GOAL #1   Title  Patient will have a decrease in back pain to 3/10 or less at worst to promote ability to ambulate, turn in bed, perform standing tasks with less pain.     Baseline  6/10 back pain at most (09/24/2017); 7-8/10 at most for the past 7 days, duration of pain is better since starting PT (10/29/2017); 5-6/10 at most for the past 7 days (11/18/2017); 8/10 at most for the past 7 days but the duration of pain is less compared to prior to starting PT (12/12/2017).  7/10 back pain at most for the past 7 days (pt states doing her exercises helps decrease her pain; 01/02/2018); walking (3/10), standing (4/10), bed mobility (0/10) 02/05/2018; 6/10 back pain at most for the past 7 days (03/03/2018)    Time  8    Period  Weeks    Status   On-going    Target Date  05/01/18      PT LONG TERM GOAL #2   Title  Patient will have a decrease in L knee pain to 3/10 or less at worst to promote ability to ambulate, negotiate stairs, get into and out of a car more comfortably.     Baseline  6/10 L knee pain at most for the past 3 months (09/24/2017); 7-8/10 at most for the past 7 days, duration of pain is better since starting PT (10/29/2017); 7/10 at most for the past 7 days (11/18/2017); 6/10 L knee pain at most for the past 7 days (12/12/2017); 5/10 L knee pain at most for the past 7 days (pt states doing her exercises helps decrease pain; 01/02/2018); L knee 3/10 (02/05/2018); 4/10 at most for the past 7 days (03/03/2018)    Time  8    Period  Weeks    Status  Partially Met    Target Date  05/01/18      PT LONG TERM GOAL #3   Title  Patient will improve TUG time to 12 seconds or less as a demonstration of improved functional mobility and balance.     Baseline  TUG no AD: 16.05 seconds on  average (09/24/2017); 14.3 seconds on average (10/29/2017); 13 seconds average (11/21/2017); 16.67 seconds average (12/12/2017);  14.17 seconds average (01/02/2018); 11.8 sec sonds (02/05/2018)    Time  6    Period  Weeks    Status  Achieved      PT LONG TERM GOAL #4   Title  Patient will improve her back FOTO score by at least 10 points as a demonstration of improved funtion.      Baseline  Back FOTO: 33 (09/24/2017); 38 (10/29/2017); 36 (11/21/2017); 40 (12/12/2017); 42 (01/02/2018); 47 (03/03/2018)    Time  6    Period  Weeks    Status  Achieved    Target Date  02/27/18      PT LONG TERM GOAL #5   Title  Patient will improve bilateral LE strength by at least 1/2 MMT grade to promote ability to perform standing tasks.     Time  6    Period  Weeks    Status  Achieved    Target Date  01/02/18      PT LONG TERM GOAL #6   Title  Patient will improve her Modified Oswestry Low Back pain disablity questionnaire by at least 10% as a demonstration of improved  function.     Baseline  48% (09/24/2017); 46% (10/29/2017), (11/21/2017); 56% (12/12/2017); 36% (01/02/2018), (03/03/2018)    Time  6    Period  Weeks    Status  Achieved    Target Date  01/02/18      PT LONG TERM GOAL #7   Title  Pt will report decreased difficulty stepping into and out of her bathtub as well as getting into and out of her truck to promote ability to get into and out of places.     Baseline  Pt reports difficulty stepping into and out of her bathtub as well as difficulty getting into and out of her truck (01/02/2018); Pt states the truck and bath tub and difficult and painful.  (02/05/2018); Improving per subjective reports (03/03/2018)    Time  8    Period  Weeks    Status  Partially Met    Target Date  05/01/18      PT LONG TERM GOAL #8   Title  Patient will improve her BERG balance test score to 46/56 or more as a demonstration of decreased fall risk     Baseline  42/56 (01/02/2018); 54/56 (02/05/2018)    Time  6    Period  Weeks    Status  Achieved    Target Date  02/27/18      PT LONG TERM GOAL  #9   TITLE  Pt will report minimal to no dizziness related to vertigo to promote balance and decrease fall risk.     Baseline  Pt reports dizziness (related to vertigo) which seem to affect her balance (01/15/2018); Patient reports "a little bit every now and then" (02/05/2018)    Time  6    Period  Weeks    Status  Achieved    Target Date  02/27/18            Plan - 03/11/18 1348    Clinical Impression Statement  Patient with good response to therapy today. Reported significant decrease in pain/perceived tension after STM, soft tissue tension/musculature decreased by 50% via palpation. Pt and PT reviewed fall recovery strategies to increase safety in home, the patient would benefit from further instruction. Did complain of L knee pain in tall  kneeling position. The patient would benefit from further skilled PT to continue to progress towards goals.    Rehab Potential  Fair     Clinical Impairments Affecting Rehab Potential  (-) chronicity of condition, multiple areas of pain, medical history, age; (+) motivated, husband support    PT Frequency  2x / week    PT Duration  8 weeks    PT Treatment/Interventions  Therapeutic activities;Therapeutic exercise;Balance training;Neuromuscular re-education;Patient/family education;Manual techniques;Dry needling;Aquatic Therapy;Electrical Stimulation;Iontophoresis 66m/ml Dexamethasone;Gait training;Canalith Repostioning;Vestibular    PT Next Visit Plan  canalith repositioning, hip and knee strengthening, core strengthening, patellar mobility, manual techniques, modalities PRN    Consulted and Agree with Plan of Care  Patient       Patient will benefit from skilled therapeutic intervention in order to improve the following deficits and impairments:  Pain, Improper body mechanics, Postural dysfunction, Dizziness, Decreased strength, Difficulty walking, Decreased balance  Visit Diagnosis: History of falling  Radiculopathy, lumbar region  Muscle weakness (generalized)  Chronic pain of left knee     Problem List Patient Active Problem List   Diagnosis Date Noted  . Obese 06/20/2016  . Status post right knee replacement 06/19/2016  . S/P left TKA 06/18/2016  . Spinal stenosis of lumbar region 09/14/2015    DLieutenant DiegoPT, DPT 2:09 PM,03/11/18 [facility] [address] [place] [address]Phone: [phone]  Fax:  [phone] Name: Destiny WIEDELMRN: [id number] of Birth: 4Mar 11, 1952

## 2018-03-18 ENCOUNTER — Ambulatory Visit: Payer: Medicare Other

## 2018-03-18 DIAGNOSIS — M5416 Radiculopathy, lumbar region: Secondary | ICD-10-CM

## 2018-03-18 DIAGNOSIS — G8929 Other chronic pain: Secondary | ICD-10-CM

## 2018-03-18 DIAGNOSIS — M25562 Pain in left knee: Secondary | ICD-10-CM

## 2018-03-18 DIAGNOSIS — M6281 Muscle weakness (generalized): Secondary | ICD-10-CM

## 2018-03-18 DIAGNOSIS — Z9181 History of falling: Secondary | ICD-10-CM

## 2018-03-18 NOTE — Therapy (Signed)
[facility] [address] [place], [address] Phone: [phone]   Fax:  [phone]  Physical Therapy Treatment  Patient Details  Name: Destiny Shaw MRN: [id number] Date of Birth: [birth year] Referring Provider (PT): Paralee Cancel, MD   Encounter Date: 03/18/2018  PT End of Session - 03/18/18 1034    Visit Number  35    Number of Visits  60    Date for PT Re-Evaluation  05/01/18    Authorization Type  8    Authorization Time Period  of 10 progress note Medicare    PT Start Time  1034    PT Stop Time  1123    PT Time Calculation (min)  49 min    Activity Tolerance  Patient tolerated treatment well    Behavior During Therapy  [facility] for tasks assessed/performed       Past Medical History:  Diagnosis Date  . ADD (attention deficit disorder)   . Anemia   . Anginal pain ([place])    pt states has occas chest pain relates to indigestion; pt uses rest to relieve   . Anxiety   . Arthritis   . Concussion   . Depression   . Diabetes mellitus without complication ([place])   . Dizziness   . Fall   . GERD (gastroesophageal reflux disease)   . Headache   . History of urinary tract infection   . Hyperlipidemia   . Hypertension   . Hypothyroidism   . IBS (irritable bowel syndrome)   . Imbalance   . Numbness    right leg   . Numbness in both hands    comes and goes   . Pneumonia    last episode approx 1 year ago   . Sleep apnea   . Wears glasses     Past Surgical History:  Procedure Laterality Date  . BREAST CYST ASPIRATION Left 1980's   neg  . BREAST CYST EXCISION Right 1980's   neg  . BREAST LUMPECTOMY Right   . BREAST SURGERY    . CARPAL TUNNEL RELEASE    . CESAREAN SECTION     times 2  . COLONOSCOPY WITH PROPOFOL N/A 05/11/2015   Procedure: COLONOSCOPY WITH PROPOFOL;  Surgeon: Manya Silvas, MD;  Location: [facility] ENDOSCOPY;  Service: Endoscopy;  Laterality: N/A;  . DE QUERVAIN'S RELEASE Right   .  ESOPHAGOGASTRODUODENOSCOPY (EGD) WITH PROPOFOL N/A 05/11/2015   Procedure: ESOPHAGOGASTRODUODENOSCOPY (EGD) WITH PROPOFOL;  Surgeon: Manya Silvas, MD;  Location[facility] ENDOSCOPY;  Service: Endoscopy;  Laterality: N/A;  . EYE SURGERY     laser surgery bilat   . HERNIA REPAIR    . KNEE ARTHROSCOPY    . LUMBAR LAMINECTOMY/DECOMPRESSION MICRODISCECTOMY Bilateral 09/14/2015   Procedure: MICRO LUMBAR BILATERAL DECOMPRESSION L4 - L5;  Surgeon: Susa Day, MD;  Location: [facility] ORS;  Service: Orthopedics;  Laterality: Bilateral;  . REDUCTION MAMMAPLASTY Bilateral 1980  . RHINOPLASTY    . TONSILLECTOMY    . TOTAL KNEE ARTHROPLASTY Left 06/18/2016   Procedure: LEFT TOTAL KNEE ARTHROPLASTY;  Surgeon: Paralee Cancel, MD;  Location: [facility] ORS;  Service: Orthopedics;  Laterality: Left;  Adductor Block  . TUBAL LIGATION    . UVULOPALATOPHARYNGOPLASTY      There were no vitals filed for this visit.  Subjective Assessment - 03/18/18 1035    Subjective  L knee is good. Back is not good. Has been doing the trunk flexion exercise which helps loosen her back  up. Her back has been bothering her for the past 2 days.  Pt states that she does not have to rest as much when doing her exercises.     Pertinent History  Low back pain. Pt states having back surgery about 2-3 years ago. After she had her L TKA, her back started aggravating her due to her walking. Pt also fell about 4 weeks ago onto her L knee. Dr. Alvan Dame checked out her L knee. L knee was fine but has some inflammation.  Pt tries to keep it iced. Pt still recovering for her L TKA but the fall set her back.   Her L knee surgery was last year.  L knee still bothers her a lot.  Had surgery for her back before her L knee surgery. Was doing well until her L knee surgery. The fall made it worse. Feels debilitating. Also has a hard time getting into and out of the car mainly due to her L knee.   Pt fell due to her L knee bucking on her. Pt was trying to pick something up  from the side of the couch.  Also has a hard time getting up from the floor.  Pt states having bladder accidents since after her fall (pt was recommended to tell her MD about it).  Bowel problems since taking medications (was constapated, but after taking medications, pt had diarrhea which was difficult to control. Getting out of the metformin fixed the bowel issue).  Pt states tingling and numbness L lateral LE along the L5 dermatome.  Denies saddle anesthesia.  Pt states that her back problem is mainly on her L side.  Pt landed on her L knee when she fell.  No other falls within the last 6 months. Pt also states being very dizzy since her L knee surgery.  Had PT treatement for her dizziness before which did not help.  The room feels like it is spinning when she gets dizzy.  I feel like it is getting better then she does something.     Patient Stated Goals  Be better able to get into and out of her vehicle (midsized truck), into and out of chairs, be better able to roll in her bed with less L knee pain. Be able to get down on the floor and get back up.     Currently in Pain?  Yes    Pain Score  7     Pain Onset  More than a month ago                               PT Education - 03/18/18 1050    Education Details  ther-ex    [employer]) Educated  Patient    Methods  Explanation;Demonstration;Tactile cues;Verbal cues    Comprehension  Returned demonstration;Verbalized understanding       MedbridgeAccess Code: [email or web address]  Latex free bands used  Pt also wants to be better able to step over her bath tub to get into her shower.  Pt also adds that she wants to work on floor to stand transfers.  Sitting posture: L trunk rotation  Standing posture: slight R lateral shift  Manual therapy  Medial patellar glide grade 3 to promote mobility   Therapeutic exercise  Supine lower trunk rotation 10x2 each direction  Decreased L LBP. Still has R LBP  Seated R hip  extension isometrics 10x5 seconds for 2 sets  Decreased  R LBP  Seated open books to promote thoracic extension and decrease low back extension pressure 10x3  Seated thoracic extension over chair 10x5 seconds for 2 sets to decrease extension pressure to low back  Standing straight pallof press resisting double green band 10x3  Forward step ups onto 1st regular step with B UE assist  R LE 10x. L low back discomfort  L LE 10x  Standing LE leg press resisting double blue band with B UE assist   R LE 10x3  L LE 10x3  Running man with contralateral foot on slider, one UE assist   R LE 10x, then 5x  L LE 10x, then 5x  Good glute muscle use felt.   Give as part of HEP for future when appropriate  Standing R lateral shift correction 10x5 seconds for 2 sets   Decreased back pain. Pt states back feels like it is going back into place.   Give as part of her HEP next visit if appropriate  2/10 back pain afterwards   Improved exercise technique, movement at target joints, use of target muscles after mod verbal, visual, tactile cues.   Decreased back pain to 2/10 after session.         PT Short Term Goals - 12/12/17 0930      PT SHORT TERM GOAL #1   Title  Patient will be independent with her HEP to help decrease back and L knee pain and improve ability to perform functional tasks.     Time  3    Period  Weeks    Status  Achieved    Target Date  12/12/17        PT Long Term Goals - 03/03/18 1116      PT LONG TERM GOAL #1   Title  Patient will have a decrease in back pain to 3/10 or less at worst to promote ability to ambulate, turn in bed, perform standing tasks with less pain.     Baseline  6/10 back pain at most (09/24/2017); 7-8/10 at most for the past 7 days, duration of pain is better since starting PT (10/29/2017); 5-6/10 at most for the past 7 days (11/18/2017); 8/10 at most for the past 7 days but the duration of pain is less compared to prior to starting PT (12/12/2017).   7/10 back pain at most for the past 7 days (pt states doing her exercises helps decrease her pain; 01/02/2018); walking (3/10), standing (4/10), bed mobility (0/10) 02/05/2018; 6/10 back pain at most for the past 7 days (03/03/2018)    Time  8    Period  Weeks    Status  On-going    Target Date  05/01/18      PT LONG TERM GOAL #2   Title  Patient will have a decrease in L knee pain to 3/10 or less at worst to promote ability to ambulate, negotiate stairs, get into and out of a car more comfortably.     Baseline  6/10 L knee pain at most for the past 3 months (09/24/2017); 7-8/10 at most for the past 7 days, duration of pain is better since starting PT (10/29/2017); 7/10 at most for the past 7 days (11/18/2017); 6/10 L knee pain at most for the past 7 days (12/12/2017); 5/10 L knee pain at most for the past 7 days (pt states doing her exercises helps decrease pain; 01/02/2018); L knee 3/10 (02/05/2018); 4/10 at most for the past 7 days (03/03/2018)  Time  8    Period  Weeks    Status  Partially Met    Target Date  05/01/18      PT LONG TERM GOAL #3   Title  Patient will improve TUG time to 12 seconds or less as a demonstration of improved functional mobility and balance.     Baseline  TUG no AD: 16.05 seconds on average (09/24/2017); 14.3 seconds on average (10/29/2017); 13 seconds average (11/21/2017); 16.67 seconds average (12/12/2017);  14.17 seconds average (01/02/2018); 11.8 sec sonds (02/05/2018)    Time  6    Period  Weeks    Status  Achieved      PT LONG TERM GOAL #4   Title  Patient will improve her back FOTO score by at least 10 points as a demonstration of improved funtion.      Baseline  Back FOTO: 33 (09/24/2017); 38 (10/29/2017); 36 (11/21/2017); 40 (12/12/2017); 42 (01/02/2018); 47 (03/03/2018)    Time  6    Period  Weeks    Status  Achieved    Target Date  02/27/18      PT LONG TERM GOAL #5   Title  Patient will improve bilateral LE strength by at least 1/2 MMT grade to promote ability to  perform standing tasks.     Time  6    Period  Weeks    Status  Achieved    Target Date  01/02/18      PT LONG TERM GOAL #6   Title  Patient will improve her Modified Oswestry Low Back pain disablity questionnaire by at least 10% as a demonstration of improved function.     Baseline  48% (09/24/2017); 46% (10/29/2017), (11/21/2017); 56% (12/12/2017); 36% (01/02/2018), (03/03/2018)    Time  6    Period  Weeks    Status  Achieved    Target Date  01/02/18      PT LONG TERM GOAL #7   Title  Pt will report decreased difficulty stepping into and out of her bathtub as well as getting into and out of her truck to promote ability to get into and out of places.     Baseline  Pt reports difficulty stepping into and out of her bathtub as well as difficulty getting into and out of her truck (01/02/2018); Pt states the truck and bath tub and difficult and painful.  (02/05/2018); Improving per subjective reports (03/03/2018)    Time  8    Period  Weeks    Status  Partially Met    Target Date  05/01/18      PT LONG TERM GOAL #8   Title  Patient will improve her BERG balance test score to 46/56 or more as a demonstration of decreased fall risk     Baseline  42/56 (01/02/2018); 54/56 (02/05/2018)    Time  6    Period  Weeks    Status  Achieved    Target Date  02/27/18      PT LONG TERM GOAL  #9   TITLE  Pt will report minimal to no dizziness related to vertigo to promote balance and decrease fall risk.     Baseline  Pt reports dizziness (related to vertigo) which seem to affect her balance (01/15/2018); Patient reports "a little bit every now and then" (02/05/2018)    Time  6    Period  Weeks    Status  Achieved    Target Date  02/27/18  Plan - 03/18/18 1050    Clinical Impression Statement  Decreased back pain with exercises to decrease extension pressure to low back as well as with R lateral shift correction. Continued working on improving posture, thoracic mobility, glute and trunk  strengthening to help decrease back pain. Worked briefly to promote medial glide to L patella to help decrease tightness and knee pain. Pt tolerated session well without aggravation of symptoms. Pt will benefit from continued skilled physical therapy services to decrease pain, improve strength and function.     Rehab Potential  Fair    Clinical Impairments Affecting Rehab Potential  (-) chronicity of condition, multiple areas of pain, medical history, age; (+) motivated, husband support    PT Frequency  2x / week    PT Duration  8 weeks    PT Treatment/Interventions  Therapeutic activities;Therapeutic exercise;Balance training;Neuromuscular re-education;Patient/family education;Manual techniques;Dry needling;Aquatic Therapy;Electrical Stimulation;Iontophoresis 40m/ml Dexamethasone;Gait training;Canalith Repostioning;Vestibular    PT Next Visit Plan  canalith repositioning, hip and knee strengthening, core strengthening, patellar mobility, manual techniques, modalities PRN    Consulted and Agree with Plan of Care  Patient       Patient will benefit from skilled therapeutic intervention in order to improve the following deficits and impairments:  Pain, Improper body mechanics, Postural dysfunction, Dizziness, Decreased strength, Difficulty walking, Decreased balance  Visit Diagnosis: History of falling  Radiculopathy, lumbar region  Muscle weakness (generalized)  Chronic pain of left knee     Problem List Patient Active Problem List   Diagnosis Date Noted  . Obese 06/20/2016  . Status post right knee replacement 06/19/2016  . S/P left TKA 06/18/2016  . Spinal stenosis of lumbar region 09/14/2015    MJoneen BoersPT, DPT   03/18/2018, 11:30 AM  [facility] [address] [place] [address]Phone: [phone]  Fax:  [phone] Name: Destiny PLOURDEMRN: [id number] of Birth: [birth year]

## 2018-03-26 ENCOUNTER — Ambulatory Visit: Payer: Medicare Other

## 2018-03-27 ENCOUNTER — Ambulatory Visit: Payer: Medicare Other

## 2018-03-27 DIAGNOSIS — M25562 Pain in left knee: Secondary | ICD-10-CM

## 2018-03-27 DIAGNOSIS — M5416 Radiculopathy, lumbar region: Secondary | ICD-10-CM

## 2018-03-27 DIAGNOSIS — G8929 Other chronic pain: Secondary | ICD-10-CM

## 2018-03-27 DIAGNOSIS — M6281 Muscle weakness (generalized): Secondary | ICD-10-CM

## 2018-03-27 DIAGNOSIS — Z9181 History of falling: Secondary | ICD-10-CM

## 2018-03-27 NOTE — Patient Instructions (Addendum)
   Gave sitting with lumbar towel roll as well as seated cervical flexion isometrics (10x5 seconds for 3 sets) as part of her HEP. Pt demonstrated and verbalized understanding. Handout provided.

## 2018-03-27 NOTE — Therapy (Signed)
[facility] [address] [place], [address] Phone: [phone]   Fax:  [phone]  Physical Therapy Treatment  Patient Details  Name: ALEXX GIAMBRA MRN: [id number] Date of Birth: 1950/05/28 Referring Provider (PT): Paralee Cancel, MD   Encounter Date: 03/27/2018  PT End of Session - 03/27/18 1522    Visit Number  36    Number of Visits  60    Date for PT Re-Evaluation  05/01/18    Authorization Type  9    Authorization Time Period  of 10 progress note Medicare    PT Start Time  1522    PT Stop Time  1606    PT Time Calculation (min)  44 min    Activity Tolerance  Patient tolerated treatment well    Behavior During Therapy  [facility] for tasks assessed/performed       Past Medical History:  Diagnosis Date  . ADD (attention deficit disorder)   . Anemia   . Anginal pain ([place])    pt states has occas chest pain relates to indigestion; pt uses rest to relieve   . Anxiety   . Arthritis   . Concussion   . Depression   . Diabetes mellitus without complication ([place])   . Dizziness   . Fall   . GERD (gastroesophageal reflux disease)   . Headache   . History of urinary tract infection   . Hyperlipidemia   . Hypertension   . Hypothyroidism   . IBS (irritable bowel syndrome)   . Imbalance   . Numbness    right leg   . Numbness in both hands    comes and goes   . Pneumonia    last episode approx 1 year ago   . Sleep apnea   . Wears glasses     Past Surgical History:  Procedure Laterality Date  . BREAST CYST ASPIRATION Left 1980's   neg  . BREAST CYST EXCISION Right 1980's   neg  . BREAST LUMPECTOMY Right   . BREAST SURGERY    . CARPAL TUNNEL RELEASE    . CESAREAN SECTION     times 2  . COLONOSCOPY WITH PROPOFOL N/A 05/11/2015   Procedure: COLONOSCOPY WITH PROPOFOL;  Surgeon: Manya Silvas, MD;  Location: [facility] ENDOSCOPY;  Service: Endoscopy;  Laterality: N/A;  . DE QUERVAIN'S RELEASE Right   .  ESOPHAGOGASTRODUODENOSCOPY (EGD) WITH PROPOFOL N/A 05/11/2015   Procedure: ESOPHAGOGASTRODUODENOSCOPY (EGD) WITH PROPOFOL;  Surgeon: Manya Silvas, MD;  Location: [facility] ENDOSCOPY;  Service: Endoscopy;  Laterality: N/A;  . EYE SURGERY     laser surgery bilat   . HERNIA REPAIR    . KNEE ARTHROSCOPY    . LUMBAR LAMINECTOMY/DECOMPRESSION MICRODISCECTOMY Bilateral 09/14/2015   Procedure: MICRO LUMBAR BILATERAL DECOMPRESSION L4 - L5;  Surgeon: Susa Day, MD;  Location: [facility] ORS;  Service: Orthopedics;  Laterality: Bilateral;  . REDUCTION MAMMAPLASTY Bilateral 1980  . RHINOPLASTY    . TONSILLECTOMY    . TOTAL KNEE ARTHROPLASTY Left 06/18/2016   Procedure: LEFT TOTAL KNEE ARTHROPLASTY;  Surgeon: Paralee Cancel, MD;  Location: [facility] ORS;  Service: Orthopedics;  Laterality: Left;  Adductor Block  . TUBAL LIGATION    . UVULOPALATOPHARYNGOPLASTY      There were no vitals filed for this visit.  Subjective Assessment - 03/27/18 1523    Subjective  Pt states that her neck and B upper trap area has been bothering. Worked on it last session but it  keeps coming back. 6.5/10 currently (8/10 neck pain at worst for the past 2 weeks).  6/10 low back pain currently. Feels like her neck hurts when her back hurts. 6.5/10 back pain at most for the past 7 days.  4.5/10 L knee sorenes when walking currently. Feels like her knee is getting better over all. 5.5/10 L knee soreness at worst for the past 7 days but able to get it calmed down.   Getting into and out of her bathtub is getting a little better.  Getting into and out of her truck sometimes is better, sometimes is not.  Some difficulty with getting out of the driver's seat due to L knee soreness.     Pertinent History  Low back pain. Pt states having back surgery about 2-3 years ago. After she had her L TKA, her back started aggravating her due to her walking. Pt also fell about 4 weeks ago onto her L knee. Dr. Alvan Dame checked out her L knee. L knee was fine but has some  inflammation.  Pt tries to keep it iced. Pt still recovering for her L TKA but the fall set her back.   Her L knee surgery was last year.  L knee still bothers her a lot.  Had surgery for her back before her L knee surgery. Was doing well until her L knee surgery. The fall made it worse. Feels debilitating. Also has a hard time getting into and out of the car mainly due to her L knee.   Pt fell due to her L knee bucking on her. Pt was trying to pick something up from the side of the couch.  Also has a hard time getting up from the floor.  Pt states having bladder accidents since after her fall (pt was recommended to tell her MD about it).  Bowel problems since taking medications (was constapated, but after taking medications, pt had diarrhea which was difficult to control. Getting out of the metformin fixed the bowel issue).  Pt states tingling and numbness L lateral LE along the L5 dermatome.  Denies saddle anesthesia.  Pt states that her back problem is mainly on her L side.  Pt landed on her L knee when she fell.  No other falls within the last 6 months. Pt also states being very dizzy since her L knee surgery.  Had PT treatement for her dizziness before which did not help.  The room feels like it is spinning when she gets dizzy.  I feel like it is getting better then she does something.     Patient Stated Goals  Be better able to get into and out of her vehicle (midsized truck), into and out of chairs, be better able to roll in her bed with less L knee pain. Be able to get down on the floor and get back up.     Currently in Pain?  Yes    Pain Score  7    6.5/10 neck   Pain Onset  More than a month ago                                  PT Education - 03/27/18 1559    Education Details  ther-ex, HEP    Person(s) Educated  Patient    Methods  Explanation;Demonstration;Tactile cues;Verbal cues;Handout    Comprehension  Returned demonstration;Verbalized understanding       Objectives  Difficulty with progress secondary to multiple areas of chronic pain.     MedbridgeAccess Code: [email or web address]  Latex free bands used  Pt also wants to be better able to step over her bath tub to get into her shower.  Pt also adds that she wants to work on floor to stand transfers.  Sitting posture: L trunk rotation  Standing posture: slight R lateral shift     Therapeutic exercise   Decreased mid back to B shoulder pain with sitting upright posture  Sitting with lumbar towel roll   Then with scapular retraction   Then with chin tucks 10x5 seconds for 3 sets    Decreased mid back to shoulder pain    Decreased B lateral neck pain   0/10 low back, 4/10 mid back, 6/10 neck pain afterwards (4/10 R neck, 6/10 L neck)  Seated cervical flexion isometrics 10x5 seconds for 3 sets  Decreased B neck pain  Reviewed and given as part of her HEP. Pt demonstrated and verbalized understanding. Handout provided   Seated B scapular retraction 10x3 with 5 second holds  Reviewed today's HEP   Increased time secondary to listening to pt subjective to cater exercise to her problem, palpation, planing exercises and implementing exercises.   Worked on decreasing neck pain today as well secondary to association with low back pain based on patient reports.    Improved exercise technique, movement at target joints, use of target muscles after mod verbal, visual, tactile cues.    Manual therapy   Seated STM to R cervical paraspinal muscle to decrease tension   Decreased neck pain.     Pt states feeling better after session.     2/10 low back, mid back 4/10, neck 5.5/10 after session. Pt states feeling better after treatment. Possible disc involvement with low and mid back pain as well secondary to extension preference today. Pt also demonstrates B cervical parapspinal and upper trap muscle tension R > L which may play a factor in her neck pain. Treatment to  decrease tension helps decrease pain. Pt will benefit from continued skilled physical therapy services to decrease pain, improve strength, and function (such as decreased difficulty getting into and out of the truck as well as stepping into and out of the bath tub). Difficulty with progress secondary to multiple areas of chronic pain.      PT Short Term Goals - 12/12/17 0930      PT SHORT TERM GOAL #1   Title  Patient will be independent with her HEP to help decrease back and L knee pain and improve ability to perform functional tasks.     Time  3    Period  Weeks    Status  Achieved    Target Date  12/12/17        PT Long Term Goals - 03/03/18 1116      PT LONG TERM GOAL #1   Title  Patient will have a decrease in back pain to 3/10 or less at worst to promote ability to ambulate, turn in bed, perform standing tasks with less pain.     Baseline  6/10 back pain at most (09/24/2017); 7-8/10 at most for the past 7 days, duration of pain is better since starting PT (10/29/2017); 5-6/10 at most for the past 7 days (11/18/2017); 8/10 at most for the past 7 days but the duration of pain is less compared to prior to starting PT (12/12/2017).  7/10 back pain at most for the past  7 days (pt states doing her exercises helps decrease her pain; 01/02/2018); walking (3/10), standing (4/10), bed mobility (0/10) 02/05/2018; 6/10 back pain at most for the past 7 days (03/03/2018)    Time  8    Period  Weeks    Status  On-going    Target Date  05/01/18      PT LONG TERM GOAL #2   Title  Patient will have a decrease in L knee pain to 3/10 or less at worst to promote ability to ambulate, negotiate stairs, get into and out of a car more comfortably.     Baseline  6/10 L knee pain at most for the past 3 months (09/24/2017); 7-8/10 at most for the past 7 days, duration of pain is better since starting PT (10/29/2017); 7/10 at most for the past 7 days (11/18/2017); 6/10 L knee pain at most for the past 7 days  (12/12/2017); 5/10 L knee pain at most for the past 7 days (pt states doing her exercises helps decrease pain; 01/02/2018); L knee 3/10 (02/05/2018); 4/10 at most for the past 7 days (03/03/2018)    Time  8    Period  Weeks    Status  Partially Met    Target Date  05/01/18      PT LONG TERM GOAL #3   Title  Patient will improve TUG time to 12 seconds or less as a demonstration of improved functional mobility and balance.     Baseline  TUG no AD: 16.05 seconds on average (09/24/2017); 14.3 seconds on average (10/29/2017); 13 seconds average (11/21/2017); 16.67 seconds average (12/12/2017);  14.17 seconds average (01/02/2018); 11.8 sec sonds (02/05/2018)    Time  6    Period  Weeks    Status  Achieved      PT LONG TERM GOAL #4   Title  Patient will improve her back FOTO score by at least 10 points as a demonstration of improved funtion.      Baseline  Back FOTO: 33 (09/24/2017); 38 (10/29/2017); 36 (11/21/2017); 40 (12/12/2017); 42 (01/02/2018); 47 (03/03/2018)    Time  6    Period  Weeks    Status  Achieved    Target Date  02/27/18      PT LONG TERM GOAL #5   Title  Patient will improve bilateral LE strength by at least 1/2 MMT grade to promote ability to perform standing tasks.     Time  6    Period  Weeks    Status  Achieved    Target Date  01/02/18      PT LONG TERM GOAL #6   Title  Patient will improve her Modified Oswestry Low Back pain disablity questionnaire by at least 10% as a demonstration of improved function.     Baseline  48% (09/24/2017); 46% (10/29/2017), (11/21/2017); 56% (12/12/2017); 36% (01/02/2018), (03/03/2018)    Time  6    Period  Weeks    Status  Achieved    Target Date  01/02/18      PT LONG TERM GOAL #7   Title  Pt will report decreased difficulty stepping into and out of her bathtub as well as getting into and out of her truck to promote ability to get into and out of places.     Baseline  Pt reports difficulty stepping into and out of her bathtub as well as difficulty  getting into and out of her truck (01/02/2018); Pt states the truck and bath tub and  difficult and painful.  (02/05/2018); Improving per subjective reports (03/03/2018)    Time  8    Period  Weeks    Status  Partially Met    Target Date  05/01/18      PT LONG TERM GOAL #8   Title  Patient will improve her BERG balance test score to 46/56 or more as a demonstration of decreased fall risk     Baseline  42/56 (01/02/2018); 54/56 (02/05/2018)    Time  6    Period  Weeks    Status  Achieved    Target Date  02/27/18      PT LONG TERM GOAL  #9   TITLE  Pt will report minimal to no dizziness related to vertigo to promote balance and decrease fall risk.     Baseline  Pt reports dizziness (related to vertigo) which seem to affect her balance (01/15/2018); Patient reports "a little bit every now and then" (02/05/2018)    Time  6    Period  Weeks    Status  Achieved    Target Date  02/27/18            Plan - 03/27/18 1837    Clinical Impression Statement  2/10 low back, mid back 4/10, neck 5.5/10 after session. Pt states feeling better after treatment. Possible disc involvement with low and mid back pain as well secondary to extension preference today. Pt also demonstrates B cervical parapspinal and upper trap muscle tension R > L which may play a factor in her neck pain. Treatment to decrease tension helps decrease pain. Pt will benefit from continued skilled physical therapy services to decrease pain, improve strength, and function (such as decreased difficulty getting into and out of the truck as well as stepping into and out of the bath tub). Difficulty with progress secondary to multiple areas of chronic pain.     Rehab Potential  Fair    Clinical Impairments Affecting Rehab Potential  (-) chronicity of condition, multiple areas of pain, medical history, age; (+) motivated, husband support    PT Frequency  2x / week    PT Duration  8 weeks    PT Treatment/Interventions  Therapeutic  activities;Therapeutic exercise;Balance training;Neuromuscular re-education;Patient/family education;Manual techniques;Dry needling;Aquatic Therapy;Electrical Stimulation;Iontophoresis 26m/ml Dexamethasone;Gait training;Canalith Repostioning;Vestibular    PT Next Visit Plan  canalith repositioning, hip and knee strengthening, core strengthening, patellar mobility, manual techniques, modalities PRN    Consulted and Agree with Plan of Care  Patient       Patient will benefit from skilled therapeutic intervention in order to improve the following deficits and impairments:  Pain, Improper body mechanics, Postural dysfunction, Dizziness, Decreased strength, Difficulty walking, Decreased balance  Visit Diagnosis: History of falling  Radiculopathy, lumbar region  Muscle weakness (generalized)  Chronic pain of left knee     Problem List Patient Active Problem List   Diagnosis Date Noted  . Obese 06/20/2016  . Status post right knee replacement 06/19/2016  . S/P left TKA 06/18/2016  . Spinal stenosis of lumbar region 09/14/2015    MJoneen BoersPT, DPT   03/27/2018, 6:46 PM  [facility] [address] [place] [address]Phone: [phone]  Fax:  [phone] Name: RPETRICE BEEDYMRN: [id number] of Birth: [birth year]

## 2018-04-01 ENCOUNTER — Ambulatory Visit: Payer: Medicare Other | Attending: Orthopedic Surgery

## 2018-04-01 DIAGNOSIS — M25562 Pain in left knee: Secondary | ICD-10-CM | POA: Insufficient documentation

## 2018-04-01 DIAGNOSIS — M5416 Radiculopathy, lumbar region: Secondary | ICD-10-CM | POA: Diagnosis present

## 2018-04-01 DIAGNOSIS — G8929 Other chronic pain: Secondary | ICD-10-CM | POA: Insufficient documentation

## 2018-04-01 DIAGNOSIS — Z9181 History of falling: Secondary | ICD-10-CM | POA: Insufficient documentation

## 2018-04-01 DIAGNOSIS — M6281 Muscle weakness (generalized): Secondary | ICD-10-CM | POA: Diagnosis present

## 2018-04-01 NOTE — Therapy (Signed)
[facility] [address], [place], [address] Phone: [phone]   Fax:  [phone]  Physical Therapy Treatment And Progress Report (02/10/2018 - 04/01/2018)  Patient Details   Name: Destiny Shaw MRN: [id number] Date of Birth: 08-21-1950 Referring Provider (PT): Paralee Cancel, MD   Encounter Date: 04/01/2018  PT End of Session - 04/01/18 1301    Visit Number  37    Number of Visits  60    Date for PT Re-Evaluation  05/01/18    Authorization Type  10    Authorization Time Period  of 10 progress note Medicare    PT Start Time  1303    PT Stop Time  5427    PT Time Calculation (min)  44 min    Activity Tolerance  Patient tolerated treatment well    Behavior During Therapy  [facility] for tasks assessed/performed       Past Medical History:  Diagnosis Date  . ADD (attention deficit disorder)   . Anemia   . Anginal pain ([place])    pt states has occas chest pain relates to indigestion; pt uses rest to relieve   . Anxiety   . Arthritis   . Concussion   . Depression   . Diabetes mellitus without complication ([place])   . Dizziness   . Fall   . GERD (gastroesophageal reflux disease)   . Headache   . History of urinary tract infection   . Hyperlipidemia   . Hypertension   . Hypothyroidism   . IBS (irritable bowel syndrome)   . Imbalance   . Numbness    right leg   . Numbness in both hands    comes and goes   . Pneumonia    last episode approx 1 year ago   . Sleep apnea   . Wears glasses     Past Surgical History:  Procedure Laterality Date  . BREAST CYST ASPIRATION Left 1980's   neg  . BREAST CYST EXCISION Right 1980's   neg  . BREAST LUMPECTOMY Right   . BREAST SURGERY    . CARPAL TUNNEL RELEASE    . CESAREAN SECTION     times 2  . COLONOSCOPY WITH PROPOFOL N/A 05/11/2015   Procedure: COLONOSCOPY WITH PROPOFOL;  Surgeon: Manya Silvas, MD;  Location: [facility] ENDOSCOPY;  Service: Endoscopy;   Laterality: N/A;  . DE QUERVAIN'S RELEASE Right   . ESOPHAGOGASTRODUODENOSCOPY (EGD) WITH PROPOFOL N/A 05/11/2015   Procedure: ESOPHAGOGASTRODUODENOSCOPY (EGD) WITH PROPOFOL;  Surgeon: Manya Silvas, MD;  Location: [facility] ENDOSCOPY;  Service: Endoscopy;  Laterality: N/A;  . EYE SURGERY     laser surgery bilat   . HERNIA REPAIR    . KNEE ARTHROSCOPY    . LUMBAR LAMINECTOMY/DECOMPRESSION MICRODISCECTOMY Bilateral 09/14/2015   Procedure: MICRO LUMBAR BILATERAL DECOMPRESSION L4 - L5;  Surgeon: Susa Day, MD;  Location: [facility] ORS;  Service: Orthopedics;  Laterality: Bilateral;  . REDUCTION MAMMAPLASTY Bilateral 1980  . RHINOPLASTY    . TONSILLECTOMY    . TOTAL KNEE ARTHROPLASTY Left 06/18/2016   Procedure: LEFT TOTAL KNEE ARTHROPLASTY;  Surgeon: Paralee Cancel, MD;  Location: [facility] ORS;  Service: Orthopedics;  Laterality: Left;  Adductor Block  . TUBAL LIGATION    . UVULOPALATOPHARYNGOPLASTY      There were no vitals filed for this visit.  Subjective Assessment - 04/01/18 1305    Subjective  Back is giving her problems. Had L knee issues yesterday, did her exercises  which helped the knee. 5/10 back pain currently (5-6/10 when moving), 3/10 L knee currently.  The exercises for the neck helps.  Pt states that she can get up 4-5 steps now.  Better overall with PT.      Pertinent History  Low back pain. Pt states having back surgery about 2-3 years ago. After she had her L TKA, her back started aggravating her due to her walking. Pt also fell about 4 weeks ago onto her L knee. Dr. Alvan Dame checked out her L knee. L knee was fine but has some inflammation.  Pt tries to keep it iced. Pt still recovering for her L TKA but the fall set her back.   Her L knee surgery was last year.  L knee still bothers her a lot.  Had surgery for her back before her L knee surgery. Was doing well until her L knee surgery. The fall made it worse. Feels debilitating. Also has a hard time getting into and out of the car mainly due to  her L knee.   Pt fell due to her L knee bucking on her. Pt was trying to pick something up from the side of the couch.  Also has a hard time getting up from the floor.  Pt states having bladder accidents since after her fall (pt was recommended to tell her MD about it).  Bowel problems since taking medications (was constapated, but after taking medications, pt had diarrhea which was difficult to control. Getting out of the metformin fixed the bowel issue).  Pt states tingling and numbness L lateral LE along the L5 dermatome.  Denies saddle anesthesia.  Pt states that her back problem is mainly on her L side.  Pt landed on her L knee when she fell.  No other falls within the last 6 months. Pt also states being very dizzy since her L knee surgery.  Had PT treatement for her dizziness before which did not help.  The room feels like it is spinning when she gets dizzy.  I feel like it is getting better then she does something.     Patient Stated Goals  Be better able to get into and out of her vehicle (midsized truck), into and out of chairs, be better able to roll in her bed with less L knee pain. Be able to get down on the floor and get back up.     Currently in Pain?  Yes    Pain Score  5    back   Pain Onset  More than a month ago                               PT Education - 04/01/18 1503    Education Details  ther-ex    Person(s) Educated  Patient    Methods  Explanation;Demonstration;Tactile cues;Verbal cues    Comprehension  Returned demonstration;Verbalized understanding      Objectives   Difficulty with progress with pain secondary to multiple areas of chronic pain.     MedbridgeAccess Code: [email or web address]  Latex free bands used  Pt also wants to be better able to step over her bath tub to get into her shower.  Pt also adds that she wants to work on floor to stand transfers.  Sitting posture: L trunk rotation  Standing posture: slight R lateral  shift   Manual therapy  Seated STM L vastus lateralis to promote better patellar  movement  Seated medial glide L patella grade 3 to promote better movement.   Also reviewed self medial patellar glide. Pt demonstrated and verbalized understanding.   Therapeutic exercise  Forward step up with L LE onto 1st regular step with B UE assist  10x3  Seated L knee flexion with ball assist 10x3 with 5 second holds to promote knee flexion for stepping over the bath tub  Running man L LE with one UE assist 10x3 and contralateral foot on furniture slider.  Decreased low back pain   Chin tucks with scapular retraction 3x to help with neck pain as well as improve thoracic extension to help decrease low back extension pressure.   Chair to floor to stand transfer, mod A first try, min A on second try.   Truck transfer   Driver's seat and passenger seat multiple times. Mod to max cues for proper technique  Decreased difficulty getting ginto and out of the driver's and passenger's seat.     Improved exercise technique, movement at target joints, use of target muscles after mod verbal, visual, tactile cues.    Pt demonstrates overall improving ability to perform functional tasks such as stepping into and out of a bath tub as well as truck transfers based on subjective reports. Improved ability to perform truck transfers with less difficulty after cues for hand placement and body positioning as well as with practice. Able to perform floor to stand transfer with mod A and chair assist. Difficulty with progress with back and knee pain in which chronicity of condition as well as neck pain may play a factor. Pt will benefit from continued skilled physical therapy services to decrease pain, improve strength and function.       PT Short Term Goals - 12/12/17 0930      PT SHORT TERM GOAL #1   Title  Patient will be independent with her HEP to help decrease back and L knee pain and improve ability to  perform functional tasks.     Time  3    Period  Weeks    Status  Achieved    Target Date  12/12/17        PT Long Term Goals - 04/01/18 1509      PT LONG TERM GOAL #1   Title  Patient will have a decrease in back pain to 3/10 or less at worst to promote ability to ambulate, turn in bed, perform standing tasks with less pain.     Baseline  6/10 back pain at most (09/24/2017); 7-8/10 at most for the past 7 days, duration of pain is better since starting PT (10/29/2017); 5-6/10 at most for the past 7 days (11/18/2017); 8/10 at most for the past 7 days but the duration of pain is less compared to prior to starting PT (12/12/2017).  7/10 back pain at most for the past 7 days (pt states doing her exercises helps decrease her pain; 01/02/2018); walking (3/10), standing (4/10), bed mobility (0/10) 02/05/2018; 6/10 back pain at most for the past 7 days (03/03/2018); 6.5/10 back pain at most for the past 7 days (03/27/2018)    Time  8    Period  Weeks    Status  On-going    Target Date  05/01/18      PT LONG TERM GOAL #2   Title  Patient will have a decrease in L knee pain to 3/10 or less at worst to promote ability to ambulate, negotiate stairs, get into and out  of a car more comfortably.     Baseline  6/10 L knee pain at most for the past 3 months (09/24/2017); 7-8/10 at most for the past 7 days, duration of pain is better since starting PT (10/29/2017); 7/10 at most for the past 7 days (11/18/2017); 6/10 L knee pain at most for the past 7 days (12/12/2017); 5/10 L knee pain at most for the past 7 days (pt states doing her exercises helps decrease pain; 01/02/2018); L knee 3/10 (02/05/2018); 4/10 at most for the past 7 days (03/03/2018); 5.5/10 L knee at most for the past 7 days (03/27/2018)    Time  8    Period  Weeks    Status  Partially Met    Target Date  05/01/18      PT LONG TERM GOAL #3   Title  Patient will improve TUG time to 12 seconds or less as a demonstration of improved functional mobility and  balance.     Baseline  TUG no AD: 16.05 seconds on average (09/24/2017); 14.3 seconds on average (10/29/2017); 13 seconds average (11/21/2017); 16.67 seconds average (12/12/2017);  14.17 seconds average (01/02/2018); 11.8 sec sonds (02/05/2018)    Time  6    Period  Weeks    Status  Achieved      PT LONG TERM GOAL #4   Title  Patient will improve her back FOTO score by at least 10 points as a demonstration of improved funtion.      Baseline  Back FOTO: 33 (09/24/2017); 38 (10/29/2017); 36 (11/21/2017); 40 (12/12/2017); 42 (01/02/2018); 47 (03/03/2018)    Time  6    Period  Weeks    Status  Achieved      PT LONG TERM GOAL #5   Title  Patient will improve bilateral LE strength by at least 1/2 MMT grade to promote ability to perform standing tasks.     Time  6    Period  Weeks    Status  Achieved      PT LONG TERM GOAL #6   Title  Patient will improve her Modified Oswestry Low Back pain disablity questionnaire by at least 10% as a demonstration of improved function.     Baseline  48% (09/24/2017); 46% (10/29/2017), (11/21/2017); 56% (12/12/2017); 36% (01/02/2018), (03/03/2018)    Time  6    Period  Weeks    Status  Achieved      PT LONG TERM GOAL #7   Title  Pt will report decreased difficulty stepping into and out of her bathtub as well as getting into and out of her truck to promote ability to get into and out of places.     Baseline  Pt reports difficulty stepping into and out of her bathtub as well as difficulty getting into and out of her truck (01/02/2018); Pt states the truck and bath tub and difficult and painful.  (02/05/2018); Improving per subjective reports (03/03/2018); decreased difficulty with truck tranfser after cues for technique and practice, decreasing difficulty with stepping into and out of the bath tub based on subjective reports (03/27/2018 and 04/01/2018)    Time  8    Period  Weeks    Status  Partially Met    Target Date  05/01/18      PT LONG TERM GOAL #8   Title  Patient will  improve her BERG balance test score to 46/56 or more as a demonstration of decreased fall risk     Baseline  42/56 (01/02/2018);  54/56 (02/05/2018)    Time  6    Period  Weeks    Status  Achieved      PT LONG TERM GOAL  #9   TITLE  Pt will report minimal to no dizziness related to vertigo to promote balance and decrease fall risk.     Baseline  Pt reports dizziness (related to vertigo) which seem to affect her balance (01/15/2018); Patient reports "a little bit every now and then" (02/05/2018)    Time  6    Period  Weeks    Status  Achieved            Plan - 04/01/18 1259    Clinical Impression Statement  Pt demonstrates overall improving ability to perform functional tasks such as stepping into and out of a bath tub as well as truck transfers based on subjective reports. Improved ability to perform truck transfers with less difficulty after cues for hand placement and body positioning as well as with practice. Able to perform floor to stand transfer with mod A and chair assist. Difficulty with progress with back and knee pain in which chronicity of condition as well as neck pain may play a factor. Pt will benefit from continued skilled physical therapy services to decrease pain, improve strength and function.     Rehab Potential  Fair    Clinical Impairments Affecting Rehab Potential  (-) chronicity of condition, multiple areas of pain, medical history, age; (+) motivated, husband support    PT Frequency  2x / week    PT Duration  8 weeks    PT Treatment/Interventions  Therapeutic activities;Therapeutic exercise;Balance training;Neuromuscular re-education;Patient/family education;Manual techniques;Dry needling;Aquatic Therapy;Electrical Stimulation;Iontophoresis 36m/ml Dexamethasone;Gait training;Canalith Repostioning;Vestibular    PT Next Visit Plan  canalith repositioning, hip and knee strengthening, core strengthening, patellar mobility, manual techniques, modalities PRN    Consulted  and Agree with Plan of Care  Patient       Patient will benefit from skilled therapeutic intervention in order to improve the following deficits and impairments:  Pain, Improper body mechanics, Postural dysfunction, Dizziness, Decreased strength, Difficulty walking, Decreased balance  Visit Diagnosis: History of falling  Radiculopathy, lumbar region  Muscle weakness (generalized)  Chronic pain of left knee     Problem List Patient Active Problem List   Diagnosis Date Noted  . Obese 06/20/2016  . Status post right knee replacement 06/19/2016  . S/P left TKA 06/18/2016  . Spinal stenosis of lumbar region 09/14/2015   Thank you for your referral.   MJoneen BoersPT, DPT   04/01/2018, 4:25 PM  [facility] [address] [place] [address]Phone: [phone]  Fax:  [phone] Name: Destiny VONDRAMRN: [id number] of Birth: 405-30-1952

## 2018-04-08 ENCOUNTER — Ambulatory Visit: Payer: Medicare Other

## 2018-04-08 DIAGNOSIS — M6281 Muscle weakness (generalized): Secondary | ICD-10-CM

## 2018-04-08 DIAGNOSIS — M5416 Radiculopathy, lumbar region: Secondary | ICD-10-CM

## 2018-04-08 DIAGNOSIS — Z9181 History of falling: Secondary | ICD-10-CM | POA: Diagnosis not present

## 2018-04-08 DIAGNOSIS — M25562 Pain in left knee: Secondary | ICD-10-CM

## 2018-04-08 DIAGNOSIS — G8929 Other chronic pain: Secondary | ICD-10-CM

## 2018-04-08 NOTE — Therapy (Signed)
[facility] [address], [place], [address] Phone: [phone]   Fax:  [phone]  Physical Therapy Treatment  Patient Details  Name: Destiny Shaw MRN: [id number] Date of Birth: [birth year] Referring Provider (PT): Paralee Cancel, MD   Encounter Date: 04/08/2018  PT End of Session - 04/08/18 1300    Visit Number  38    Number of Visits  60    Date for PT Re-Evaluation  05/01/18    Authorization Type  1    Authorization Time Period  of 10 progress note Medicare    PT Start Time  1300    PT Stop Time  1344    PT Time Calculation (min)  44 min    Activity Tolerance  Patient tolerated treatment well    Behavior During Therapy  [facility] for tasks assessed/performed       Past Medical History:  Diagnosis Date  . ADD (attention deficit disorder)   . Anemia   . Anginal pain ([place])    pt states has occas chest pain relates to indigestion; pt uses rest to relieve   . Anxiety   . Arthritis   . Concussion   . Depression   . Diabetes mellitus without complication ([place])   . Dizziness   . Fall   . GERD (gastroesophageal reflux disease)   . Headache   . History of urinary tract infection   . Hyperlipidemia   . Hypertension   . Hypothyroidism   . IBS (irritable bowel syndrome)   . Imbalance   . Numbness    right leg   . Numbness in both hands    comes and goes   . Pneumonia    last episode approx 1 year ago   . Sleep apnea   . Wears glasses     Past Surgical History:  Procedure Laterality Date  . BREAST CYST ASPIRATION Left 1980's   neg  . BREAST CYST EXCISION Right 1980's   neg  . BREAST LUMPECTOMY Right   . BREAST SURGERY    . CARPAL TUNNEL RELEASE    . CESAREAN SECTION     times 2  . COLONOSCOPY WITH PROPOFOL N/A 05/11/2015   Procedure: COLONOSCOPY WITH PROPOFOL;  Surgeon: Manya Silvas, MD;  Location: [facility] ENDOSCOPY;  Service: Endoscopy;  Laterality: N/A;  . DE QUERVAIN'S RELEASE Right   .  ESOPHAGOGASTRODUODENOSCOPY (EGD) WITH PROPOFOL N/A 05/11/2015   Procedure: ESOPHAGOGASTRODUODENOSCOPY (EGD) WITH PROPOFOL;  Surgeon: Manya Silvas, MD;  Location: [facility] ENDOSCOPY;  Service: Endoscopy;  Laterality: N/A;  . EYE SURGERY     laser surgery bilat   . HERNIA REPAIR    . KNEE ARTHROSCOPY    . LUMBAR LAMINECTOMY/DECOMPRESSION MICRODISCECTOMY Bilateral 09/14/2015   Procedure: MICRO LUMBAR BILATERAL DECOMPRESSION L4 - L5;  Surgeon: Susa Day, MD;  Location: [facility] ORS;  Service: Orthopedics;  Laterality: Bilateral;  . REDUCTION MAMMAPLASTY Bilateral 1980  . RHINOPLASTY    . TONSILLECTOMY    . TOTAL KNEE ARTHROPLASTY Left 06/18/2016   Procedure: LEFT TOTAL KNEE ARTHROPLASTY;  Surgeon: Paralee Cancel, MD;  Location: [facility] ORS;  Service: Orthopedics;  Laterality: Left;  Adductor Block  . TUBAL LIGATION    . UVULOPALATOPHARYNGOPLASTY      There were no vitals filed for this visit.  Subjective Assessment - 04/08/18 1302    Subjective  Back is bothering her at the lower part (5/10 currently) . Neck is ok. L knee is a little stiff.  Pertinent History  Low back pain. Pt states having back surgery about 2-3 years ago. After she had her L TKA, her back started aggravating her due to her walking. Pt also fell about 4 weeks ago onto her L knee. Dr. Alvan Dame checked out her L knee. L knee was fine but has some inflammation.  Pt tries to keep it iced. Pt still recovering for her L TKA but the fall set her back.   Her L knee surgery was last year.  L knee still bothers her a lot.  Had surgery for her back before her L knee surgery. Was doing well until her L knee surgery. The fall made it worse. Feels debilitating. Also has a hard time getting into and out of the car mainly due to her L knee.   Pt fell due to her L knee bucking on her. Pt was trying to pick something up from the side of the couch.  Also has a hard time getting up from the floor.  Pt states having bladder accidents since after her fall (pt was  recommended to tell her MD about it).  Bowel problems since taking medications (was constapated, but after taking medications, pt had diarrhea which was difficult to control. Getting out of the metformin fixed the bowel issue).  Pt states tingling and numbness L lateral LE along the L5 dermatome.  Denies saddle anesthesia.  Pt states that her back problem is mainly on her L side.  Pt landed on her L knee when she fell.  No other falls within the last 6 months. Pt also states being very dizzy since her L knee surgery.  Had PT treatement for her dizziness before which did not help.  The room feels like it is spinning when she gets dizzy.  I feel like it is getting better then she does something.     Patient Stated Goals  Be better able to get into and out of her vehicle (midsized truck), into and out of chairs, be better able to roll in her bed with less L knee pain. Be able to get down on the floor and get back up.     Currently in Pain?  Yes    Pain Score  5     Pain Onset  More than a month ago                               PT Education - 04/08/18 1324    Education Details  ther-ex    Person(s) Educated  Patient    Methods  Explanation;Demonstration;Tactile cues;Verbal cues    Comprehension  Verbalized understanding      Objectives   Difficulty with progress with pain secondary to multiple areas of chronic pain.   MedbridgeAccess Code: [email or web address]  Latex free bands used  Pt also wants to be better able to step over her bath tub to get into her shower.  Pt also adds that she wants to work on floor to stand transfers.  Sitting posture: L trunk rotation  Standing posture: slight R lateral shift      Therapeutic exercise  Running man L LE with one UE assist 10x3 and contralateral foot on furniture slider.            Lateral lunge with B UE assist   L LE 10x3   Seated hip adduction ball and glute max squeeze 10x10 seconds   SLS with B  UE assist to promote glute med muscle use  L LE 10x5 seconds, emphasis on level pelvis  Standing  hip abduction 3 lbs with B UE assist   L 10x2  R 10x2  Almost difficult  Seated L hip ER  10x3  Side stepping 32 ft to the R and 15 ft to the L. R chest discomfort  Blood pressure L arm sitting, mechanically taken, normal cuff:  163/55, HR 88. No lightheadedness, dizziness, headache, blurred vision. No chest pressure in front or back, no difficulty breathing.   No R chest pain after rest.     Has had the R chest pain before when doing stuff. Sees her heart doctor in a couple weeks. Her cardiologist knows about that she thinks. Not sure if she told him.   No back and L knee pain after aforementioned exercises.   Seated B scapular retraction 10x5 seconds for 3 sets  Seated thoracic extension on chair with hands on lap 10x5 seconds for 3 sets to decrease low back extension pressure.   Seated manually resisted L scapular retraction targeting the lower trap muscle to decrease L upper trap tension 10x5 seconds for 2 sets  No L upper trap muscle tension afterwards.   Improved exercise technique, movement at target joints, use of target muscles after min to mod verbal, visual, tactile cues.     Decreased low back pain with exercises to promote glute med and max muscle activation as well as thoracic extension to decrease extension pressure to low back.  Pt states feeling better and a whole lot looser after session. Pt will benefit from continued skilled physical therapy services to decrease back, neck, and knee pain, improve strength and function.        PT Short Term Goals - 12/12/17 0930      PT SHORT TERM GOAL #1   Title  Patient will be independent with her HEP to help decrease back and L knee pain and improve ability to perform functional tasks.     Time  3    Period  Weeks    Status  Achieved    Target Date  12/12/17        PT Long Term Goals - 04/01/18 1509      PT LONG  TERM GOAL #1   Title  Patient will have a decrease in back pain to 3/10 or less at worst to promote ability to ambulate, turn in bed, perform standing tasks with less pain.     Baseline  6/10 back pain at most (09/24/2017); 7-8/10 at most for the past 7 days, duration of pain is better since starting PT (10/29/2017); 5-6/10 at most for the past 7 days (11/18/2017); 8/10 at most for the past 7 days but the duration of pain is less compared to prior to starting PT (12/12/2017).  7/10 back pain at most for the past 7 days (pt states doing her exercises helps decrease her pain; 01/02/2018); walking (3/10), standing (4/10), bed mobility (0/10) 02/05/2018; 6/10 back pain at most for the past 7 days (03/03/2018); 6.5/10 back pain at most for the past 7 days (03/27/2018)    Time  8    Period  Weeks    Status  On-going    Target Date  05/01/18      PT LONG TERM GOAL #2   Title  Patient will have a decrease in L knee pain to 3/10 or less at worst to promote ability to ambulate, negotiate stairs, get into  and out of a car more comfortably.     Baseline  6/10 L knee pain at most for the past 3 months (09/24/2017); 7-8/10 at most for the past 7 days, duration of pain is better since starting PT (10/29/2017); 7/10 at most for the past 7 days (11/18/2017); 6/10 L knee pain at most for the past 7 days (12/12/2017); 5/10 L knee pain at most for the past 7 days (pt states doing her exercises helps decrease pain; 01/02/2018); L knee 3/10 (02/05/2018); 4/10 at most for the past 7 days (03/03/2018); 5.5/10 L knee at most for the past 7 days (03/27/2018)    Time  8    Period  Weeks    Status  Partially Met    Target Date  05/01/18      PT LONG TERM GOAL #3   Title  Patient will improve TUG time to 12 seconds or less as a demonstration of improved functional mobility and balance.     Baseline  TUG no AD: 16.05 seconds on average (09/24/2017); 14.3 seconds on average (10/29/2017); 13 seconds average (11/21/2017); 16.67 seconds average  (12/12/2017);  14.17 seconds average (01/02/2018); 11.8 sec sonds (02/05/2018)    Time  6    Period  Weeks    Status  Achieved      PT LONG TERM GOAL #4   Title  Patient will improve her back FOTO score by at least 10 points as a demonstration of improved funtion.      Baseline  Back FOTO: 33 (09/24/2017); 38 (10/29/2017); 36 (11/21/2017); 40 (12/12/2017); 42 (01/02/2018); 47 (03/03/2018)    Time  6    Period  Weeks    Status  Achieved      PT LONG TERM GOAL #5   Title  Patient will improve bilateral LE strength by at least 1/2 MMT grade to promote ability to perform standing tasks.     Time  6    Period  Weeks    Status  Achieved      PT LONG TERM GOAL #6   Title  Patient will improve her Modified Oswestry Low Back pain disablity questionnaire by at least 10% as a demonstration of improved function.     Baseline  48% (09/24/2017); 46% (10/29/2017), (11/21/2017); 56% (12/12/2017); 36% (01/02/2018), (03/03/2018)    Time  6    Period  Weeks    Status  Achieved      PT LONG TERM GOAL #7   Title  Pt will report decreased difficulty stepping into and out of her bathtub as well as getting into and out of her truck to promote ability to get into and out of places.     Baseline  Pt reports difficulty stepping into and out of her bathtub as well as difficulty getting into and out of her truck (01/02/2018); Pt states the truck and bath tub and difficult and painful.  (02/05/2018); Improving per subjective reports (03/03/2018); decreased difficulty with truck tranfser after cues for technique and practice, decreasing difficulty with stepping into and out of the bath tub based on subjective reports (03/27/2018 and 04/01/2018)    Time  8    Period  Weeks    Status  Partially Met    Target Date  05/01/18      PT LONG TERM GOAL #8   Title  Patient will improve her BERG balance test score to 46/56 or more as a demonstration of decreased fall risk     Baseline  42/56 (  01/02/2018); 54/56 (02/05/2018)    Time  6     Period  Weeks    Status  Achieved      PT LONG TERM GOAL  #9   TITLE  Pt will report minimal to no dizziness related to vertigo to promote balance and decrease fall risk.     Baseline  Pt reports dizziness (related to vertigo) which seem to affect her balance (01/15/2018); Patient reports "a little bit every now and then" (02/05/2018)    Time  6    Period  Weeks    Status  Achieved            Plan - 04/08/18 1257    Clinical Impression Statement  Decreased low back pain with exercises to promote glute med and max muscle activation as well as thoracic extension to decrease extension pressure to low back.  Pt states feeling better and a whole lot looser after session. Pt will benefit from continued skilled physical therapy services to decrease back, neck, and knee pain, improve strength and function.     Rehab Potential  Fair    Clinical Impairments Affecting Rehab Potential  (-) chronicity of condition, multiple areas of pain, medical history, age; (+) motivated, husband support    PT Frequency  2x / week    PT Duration  8 weeks    PT Treatment/Interventions  Therapeutic activities;Therapeutic exercise;Balance training;Neuromuscular re-education;Patient/family education;Manual techniques;Dry needling;Aquatic Therapy;Electrical Stimulation;Iontophoresis 40m/ml Dexamethasone;Gait training;Canalith Repostioning;Vestibular    PT Next Visit Plan  canalith repositioning, hip and knee strengthening, core strengthening, patellar mobility, manual techniques, modalities PRN    Consulted and Agree with Plan of Care  Patient       Patient will benefit from skilled therapeutic intervention in order to improve the following deficits and impairments:  Pain, Improper body mechanics, Postural dysfunction, Dizziness, Decreased strength, Difficulty walking, Decreased balance  Visit Diagnosis: History of falling  Radiculopathy, lumbar region  Muscle weakness (generalized)  Chronic pain of left  knee     Problem List Patient Active Problem List   Diagnosis Date Noted  . Obese 06/20/2016  . Status post right knee replacement 06/19/2016  . S/P left TKA 06/18/2016  . Spinal stenosis of lumbar region 09/14/2015    MJoneen BoersPT, DPT   04/08/2018, 3:02 PM  [facility] [address] [place] [address]Phone: [phone]  Fax:  [phone] Name: Destiny KRENZMRN: [id number] of Birth: [birth year]

## 2018-04-15 ENCOUNTER — Ambulatory Visit: Payer: Medicare Other

## 2018-04-15 DIAGNOSIS — G8929 Other chronic pain: Secondary | ICD-10-CM

## 2018-04-15 DIAGNOSIS — M5416 Radiculopathy, lumbar region: Secondary | ICD-10-CM

## 2018-04-15 DIAGNOSIS — M25562 Pain in left knee: Secondary | ICD-10-CM

## 2018-04-15 DIAGNOSIS — Z9181 History of falling: Secondary | ICD-10-CM

## 2018-04-15 DIAGNOSIS — M6281 Muscle weakness (generalized): Secondary | ICD-10-CM

## 2018-04-15 NOTE — Therapy (Signed)
[facility] [address] [place], [address] Phone: [phone]   Fax:  [phone]  Physical Therapy Treatment  Patient Details  Name: Destiny Shaw MRN: [id number] Date of Birth: 13-Nov-1950 Referring Provider (PT): Paralee Cancel, MD   Encounter Date: 04/15/2018  PT End of Session - 04/15/18 1304    Visit Number  39    Number of Visits  60    Date for PT Re-Evaluation  05/01/18    Authorization Type  2    Authorization Time Period  of 10 progress note Medicare    PT Start Time  1305    PT Stop Time  1352    PT Time Calculation (min)  47 min    Activity Tolerance  Patient tolerated treatment well    Behavior During Therapy  [facility] for tasks assessed/performed       Past Medical History:  Diagnosis Date  . ADD (attention deficit disorder)   . Anemia   . Anginal pain ([place])    pt states has occas chest pain relates to indigestion; pt uses rest to relieve   . Anxiety   . Arthritis   . Concussion   . Depression   . Diabetes mellitus without complication ([place])   . Dizziness   . Fall   . GERD (gastroesophageal reflux disease)   . Headache   . History of urinary tract infection   . Hyperlipidemia   . Hypertension   . Hypothyroidism   . IBS (irritable bowel syndrome)   . Imbalance   . Numbness    right leg   . Numbness in both hands    comes and goes   . Pneumonia    last episode approx 1 year ago   . Sleep apnea   . Wears glasses     Past Surgical History:  Procedure Laterality Date  . BREAST CYST ASPIRATION Left 1980's   neg  . BREAST CYST EXCISION Right 1980's   neg  . BREAST LUMPECTOMY Right   . BREAST SURGERY    . CARPAL TUNNEL RELEASE    . CESAREAN SECTION     times 2  . COLONOSCOPY WITH PROPOFOL N/A 05/11/2015   Procedure: COLONOSCOPY WITH PROPOFOL;  Surgeon: Manya Silvas, MD;  Location: [facility] ENDOSCOPY;  Service: Endoscopy;  Laterality: N/A;  . DE QUERVAIN'S RELEASE Right   .  ESOPHAGOGASTRODUODENOSCOPY (EGD) WITH PROPOFOL N/A 05/11/2015   Procedure: ESOPHAGOGASTRODUODENOSCOPY (EGD) WITH PROPOFOL;  Surgeon: Manya Silvas, MD;  Location: [facility] ENDOSCOPY;  Service: Endoscopy;  Laterality: N/A;  . EYE SURGERY     laser surgery bilat   . HERNIA REPAIR    . KNEE ARTHROSCOPY    . LUMBAR LAMINECTOMY/DECOMPRESSION MICRODISCECTOMY Bilateral 09/14/2015   Procedure: MICRO LUMBAR BILATERAL DECOMPRESSION L4 - L5;  Surgeon: Susa Day, MD;  Location: [facility] ORS;  Service: Orthopedics;  Laterality: Bilateral;  . REDUCTION MAMMAPLASTY Bilateral 1980  . RHINOPLASTY    . TONSILLECTOMY    . TOTAL KNEE ARTHROPLASTY Left 06/18/2016   Procedure: LEFT TOTAL KNEE ARTHROPLASTY;  Surgeon: Paralee Cancel, MD;  Location: [facility] ORS;  Service: Orthopedics;  Laterality: Left;  Adductor Block  . TUBAL LIGATION    . UVULOPALATOPHARYNGOPLASTY      There were no vitals filed for this visit.  Subjective Assessment - 04/15/18 1305    Subjective  I think my knee is pretty good. Has been working on her knee cap. Her back is probably a 2-3/10  currently. No chest pain.     Pertinent History  Low back pain. Pt states having back surgery about 2-3 years ago. After she had her L TKA, her back started aggravating her due to her walking. Pt also fell about 4 weeks ago onto her L knee. Dr. Alvan Dame checked out her L knee. L knee was fine but has some inflammation.  Pt tries to keep it iced. Pt still recovering for her L TKA but the fall set her back.   Her L knee surgery was last year.  L knee still bothers her a lot.  Had surgery for her back before her L knee surgery. Was doing well until her L knee surgery. The fall made it worse. Feels debilitating. Also has a hard time getting into and out of the car mainly due to her L knee.   Pt fell due to her L knee bucking on her. Pt was trying to pick something up from the side of the couch.  Also has a hard time getting up from the floor.  Pt states having bladder accidents since  after her fall (pt was recommended to tell her MD about it).  Bowel problems since taking medications (was constapated, but after taking medications, pt had diarrhea which was difficult to control. Getting out of the metformin fixed the bowel issue).  Pt states tingling and numbness L lateral LE along the L5 dermatome.  Denies saddle anesthesia.  Pt states that her back problem is mainly on her L side.  Pt landed on her L knee when she fell.  No other falls within the last 6 months. Pt also states being very dizzy since her L knee surgery.  Had PT treatement for her dizziness before which did not help.  The room feels like it is spinning when she gets dizzy.  I feel like it is getting better then she does something.     Patient Stated Goals  Be better able to get into and out of her vehicle (midsized truck), into and out of chairs, be better able to roll in her bed with less L knee pain. Be able to get down on the floor and get back up.     Currently in Pain?  Yes    Pain Score  3     Pain Onset  More than a month ago                               PT Education - 04/15/18 1319    Education Details  ther-ex    [employer]) Educated  Patient    Methods  Explanation;Demonstration;Tactile cues;Verbal cues    Comprehension  Returned demonstration;Verbalized understanding      Objectives   Difficulty with progresswith painsecondary to multiple areas of chronic pain.   MedbridgeAccess Code: [email or web address]  Latex free bands used  Pt also wants to be better able to step over her bath tub to get into her shower.  Pt also adds that she wants to work on floor to stand transfers.  Sitting posture: L trunk rotation  Standing posture: slight L lateral shift      Therapeutic exercise  Blood pressure L arm sitting, mechanically taken, normal cuff: 138/72, HR 72   Side stepping 32 ft to the R and 32 ft to the L.   Forward wedding march 32 ft. Good glute  muscle use felt   Running man L LE with one  UE assist 10x3. Good glute and quadriceps muscle use felt.   Give as part of her HEP next visit   Seated hip adduction ball and glute max squeeze 10x2 with 5 seconds   Lateral lunge with B UE assist              L LE 10x L patella discomfort  L lateral step up with B UE assist 4 inch step 10x3  Seated L hip ER             10x3  Standing  hip abduction 3 lbs with B UE assist              L 10x2             R 10x2    Seated thoracic extension on chair with hands on lap 10x5 seconds for 3 sets to decrease low back extension pressure.   SLS with B UE assist to promote glute med muscle use             L LE 10x5 seconds for 2 sets, emphasis on level pelvis   Seated manually resisted L scapular retraction targeting the lower trap muscle to decrease L upper trap tension 10x5 seconds for 2 sets          Standing L lateral shift correction 10x5 seconds for 2 sets  Decreased feeling of being "stuck" on R low back afterwards   Discussed plan of care with pt. Pt states wanting to do 1x/week for 16 weeks.    Improved exercise technique, movement at target joints, use of target muscles after min to mod verbal, visual, tactile cues.  Continued working on L LE strengthening (glute med, glute max, quadriceps) as well as with thoracic extension, and improving posture. No back  after session. Pt will benefit from continued skilled physical therapy services to decrease pain, improve strength and function.             PT Short Term Goals - 12/12/17 0930      PT SHORT TERM GOAL #1   Title  Patient will be independent with her HEP to help decrease back and L knee pain and improve ability to perform functional tasks.     Time  3    Period  Weeks    Status  Achieved    Target Date  12/12/17        PT Long Term Goals - 04/01/18 1509      PT LONG TERM GOAL #1   Title  Patient will have a decrease in back pain to 3/10 or less at worst  to promote ability to ambulate, turn in bed, perform standing tasks with less pain.     Baseline  6/10 back pain at most (09/24/2017); 7-8/10 at most for the past 7 days, duration of pain is better since starting PT (10/29/2017); 5-6/10 at most for the past 7 days (11/18/2017); 8/10 at most for the past 7 days but the duration of pain is less compared to prior to starting PT (12/12/2017).  7/10 back pain at most for the past 7 days (pt states doing her exercises helps decrease her pain; 01/02/2018); walking (3/10), standing (4/10), bed mobility (0/10) 02/05/2018; 6/10 back pain at most for the past 7 days (03/03/2018); 6.5/10 back pain at most for the past 7 days (03/27/2018)    Time  8    Period  Weeks    Status  On-going    Target Date  05/01/18  PT LONG TERM GOAL #2   Title  Patient will have a decrease in L knee pain to 3/10 or less at worst to promote ability to ambulate, negotiate stairs, get into and out of a car more comfortably.     Baseline  6/10 L knee pain at most for the past 3 months (09/24/2017); 7-8/10 at most for the past 7 days, duration of pain is better since starting PT (10/29/2017); 7/10 at most for the past 7 days (11/18/2017); 6/10 L knee pain at most for the past 7 days (12/12/2017); 5/10 L knee pain at most for the past 7 days (pt states doing her exercises helps decrease pain; 01/02/2018); L knee 3/10 (02/05/2018); 4/10 at most for the past 7 days (03/03/2018); 5.5/10 L knee at most for the past 7 days (03/27/2018)    Time  8    Period  Weeks    Status  Partially Met    Target Date  05/01/18      PT LONG TERM GOAL #3   Title  Patient will improve TUG time to 12 seconds or less as a demonstration of improved functional mobility and balance.     Baseline  TUG no AD: 16.05 seconds on average (09/24/2017); 14.3 seconds on average (10/29/2017); 13 seconds average (11/21/2017); 16.67 seconds average (12/12/2017);  14.17 seconds average (01/02/2018); 11.8 sec sonds (02/05/2018)    Time  6     Period  Weeks    Status  Achieved      PT LONG TERM GOAL #4   Title  Patient will improve her back FOTO score by at least 10 points as a demonstration of improved funtion.      Baseline  Back FOTO: 33 (09/24/2017); 38 (10/29/2017); 36 (11/21/2017); 40 (12/12/2017); 42 (01/02/2018); 47 (03/03/2018)    Time  6    Period  Weeks    Status  Achieved      PT LONG TERM GOAL #5   Title  Patient will improve bilateral LE strength by at least 1/2 MMT grade to promote ability to perform standing tasks.     Time  6    Period  Weeks    Status  Achieved      PT LONG TERM GOAL #6   Title  Patient will improve her Modified Oswestry Low Back pain disablity questionnaire by at least 10% as a demonstration of improved function.     Baseline  48% (09/24/2017); 46% (10/29/2017), (11/21/2017); 56% (12/12/2017); 36% (01/02/2018), (03/03/2018)    Time  6    Period  Weeks    Status  Achieved      PT LONG TERM GOAL #7   Title  Pt will report decreased difficulty stepping into and out of her bathtub as well as getting into and out of her truck to promote ability to get into and out of places.     Baseline  Pt reports difficulty stepping into and out of her bathtub as well as difficulty getting into and out of her truck (01/02/2018); Pt states the truck and bath tub and difficult and painful.  (02/05/2018); Improving per subjective reports (03/03/2018); decreased difficulty with truck tranfser after cues for technique and practice, decreasing difficulty with stepping into and out of the bath tub based on subjective reports (03/27/2018 and 04/01/2018)    Time  8    Period  Weeks    Status  Partially Met    Target Date  05/01/18      PT LONG  TERM GOAL #8   Title  Patient will improve her BERG balance test score to 46/56 or more as a demonstration of decreased fall risk     Baseline  42/56 (01/02/2018); 54/56 (02/05/2018)    Time  6    Period  Weeks    Status  Achieved      PT LONG TERM GOAL  #9   TITLE  Pt will report minimal  to no dizziness related to vertigo to promote balance and decrease fall risk.     Baseline  Pt reports dizziness (related to vertigo) which seem to affect her balance (01/15/2018); Patient reports "a little bit every now and then" (02/05/2018)    Time  6    Period  Weeks    Status  Achieved            Plan - 04/15/18 1322    Clinical Impression Statement  Continued working on L LE strengthening (glute med, glute max, quadriceps) as well as with thoracic extension, and improving posture. No back  after session. Pt will benefit from continued skilled physical therapy services to decrease pain, improve strength and function.     Rehab Potential  Fair    Clinical Impairments Affecting Rehab Potential  (-) chronicity of condition, multiple areas of pain, medical history, age; (+) motivated, husband support    PT Frequency  2x / week    PT Duration  8 weeks    PT Treatment/Interventions  Therapeutic activities;Therapeutic exercise;Balance training;Neuromuscular re-education;Patient/family education;Manual techniques;Dry needling;Aquatic Therapy;Electrical Stimulation;Iontophoresis 69m/ml Dexamethasone;Gait training;Canalith Repostioning;Vestibular    PT Next Visit Plan  canalith repositioning, hip and knee strengthening, core strengthening, patellar mobility, manual techniques, modalities PRN    Consulted and Agree with Plan of Care  Patient       Patient will benefit from skilled therapeutic intervention in order to improve the following deficits and impairments:  Pain, Improper body mechanics, Postural dysfunction, Dizziness, Decreased strength, Difficulty walking, Decreased balance  Visit Diagnosis: History of falling  Radiculopathy, lumbar region  Muscle weakness (generalized)  Chronic pain of left knee     Problem List Patient Active Problem List   Diagnosis Date Noted  . Obese 06/20/2016  . Status post right knee replacement 06/19/2016  . S/P left TKA 06/18/2016  . Spinal  stenosis of lumbar region 09/14/2015    MJoneen BoersPT, DPT   04/15/2018, 2:04 PM  [facility] [address] [place] [address]Phone: [phone]  Fax:  [phone] Name: RLUJEAN EBRIGHTMRN: [id number] of Birth: 4June 29, 1952

## 2018-04-17 ENCOUNTER — Ambulatory Visit: Payer: Medicare Other

## 2018-04-21 ENCOUNTER — Ambulatory Visit: Payer: Medicare Other

## 2018-04-21 DIAGNOSIS — M25562 Pain in left knee: Secondary | ICD-10-CM

## 2018-04-21 DIAGNOSIS — M6281 Muscle weakness (generalized): Secondary | ICD-10-CM

## 2018-04-21 DIAGNOSIS — Z9181 History of falling: Secondary | ICD-10-CM | POA: Diagnosis not present

## 2018-04-21 DIAGNOSIS — G8929 Other chronic pain: Secondary | ICD-10-CM

## 2018-04-21 DIAGNOSIS — M5416 Radiculopathy, lumbar region: Secondary | ICD-10-CM

## 2018-04-21 NOTE — Therapy (Signed)
[facility] [address] [place], [address] Phone: [phone]   Fax:  [phone]  Physical Therapy Treatment  Patient Details  Name: Destiny Shaw MRN: [id number] Date of Birth: 1950/06/05 Referring Provider (PT): Paralee Cancel, MD   Encounter Date: 04/21/2018  PT End of Session - 04/21/18 1301    Visit Number  40    Number of Visits  60    Date for PT Re-Evaluation  05/01/18    Authorization Type  3    Authorization Time Period  of 10 progress note Medicare    PT Start Time  1301    PT Stop Time  1342    PT Time Calculation (min)  41 min    Activity Tolerance  Patient tolerated treatment well    Behavior During Therapy  [facility] for tasks assessed/performed       Past Medical History:  Diagnosis Date  . ADD (attention deficit disorder)   . Anemia   . Anginal pain ([place])    pt states has occas chest pain relates to indigestion; pt uses rest to relieve   . Anxiety   . Arthritis   . Concussion   . Depression   . Diabetes mellitus without complication ([place])   . Dizziness   . Fall   . GERD (gastroesophageal reflux disease)   . Headache   . History of urinary tract infection   . Hyperlipidemia   . Hypertension   . Hypothyroidism   . IBS (irritable bowel syndrome)   . Imbalance   . Numbness    right leg   . Numbness in both hands    comes and goes   . Pneumonia    last episode approx 1 year ago   . Sleep apnea   . Wears glasses     Past Surgical History:  Procedure Laterality Date  . BREAST CYST ASPIRATION Left 1980's   neg  . BREAST CYST EXCISION Right 1980's   neg  . BREAST LUMPECTOMY Right   . BREAST SURGERY    . CARPAL TUNNEL RELEASE    . CESAREAN SECTION     times 2  . COLONOSCOPY WITH PROPOFOL N/A 05/11/2015   Procedure: COLONOSCOPY WITH PROPOFOL;  Surgeon: Manya Silvas, MD;  Location: [facility] ENDOSCOPY;  Service: Endoscopy;  Laterality: N/A;  . DE QUERVAIN'S RELEASE Right   .  ESOPHAGOGASTRODUODENOSCOPY (EGD) WITH PROPOFOL N/A 05/11/2015   Procedure: ESOPHAGOGASTRODUODENOSCOPY (EGD) WITH PROPOFOL;  Surgeon: Manya Silvas, MD;  Location: [facility] ENDOSCOPY;  Service: Endoscopy;  Laterality: N/A;  . EYE SURGERY     laser surgery bilat   . HERNIA REPAIR    . KNEE ARTHROSCOPY    . LUMBAR LAMINECTOMY/DECOMPRESSION MICRODISCECTOMY Bilateral 09/14/2015   Procedure: MICRO LUMBAR BILATERAL DECOMPRESSION L4 - L5;  Surgeon: Susa Day, MD;  Location: [facility] ORS;  Service: Orthopedics;  Laterality: Bilateral;  . REDUCTION MAMMAPLASTY Bilateral 1980  . RHINOPLASTY    . TONSILLECTOMY    . TOTAL KNEE ARTHROPLASTY Left 06/18/2016   Procedure: LEFT TOTAL KNEE ARTHROPLASTY;  Surgeon: Paralee Cancel, MD;  Location: [facility] ORS;  Service: Orthopedics;  Laterality: Left;  Adductor Block  . TUBAL LIGATION    . UVULOPALATOPHARYNGOPLASTY      There were no vitals filed for this visit.  Subjective Assessment - 04/21/18 1302    Subjective  L knee is doing pretty good today. Has been working on it and stretching. Back is actually about a 3/10.  Its not bad. No chest discomfort.     Pertinent History  Low back pain. Pt states having back surgery about 2-3 years ago. After she had her L TKA, her back started aggravating her due to her walking. Pt also fell about 4 weeks ago onto her L knee. Dr. Alvan Dame checked out her L knee. L knee was fine but has some inflammation.  Pt tries to keep it iced. Pt still recovering for her L TKA but the fall set her back.   Her L knee surgery was last year.  L knee still bothers her a lot.  Had surgery for her back before her L knee surgery. Was doing well until her L knee surgery. The fall made it worse. Feels debilitating. Also has a hard time getting into and out of the car mainly due to her L knee.   Pt fell due to her L knee bucking on her. Pt was trying to pick something up from the side of the couch.  Also has a hard time getting up from the floor.  Pt states having  bladder accidents since after her fall (pt was recommended to tell her MD about it).  Bowel problems since taking medications (was constapated, but after taking medications, pt had diarrhea which was difficult to control. Getting out of the metformin fixed the bowel issue).  Pt states tingling and numbness L lateral LE along the L5 dermatome.  Denies saddle anesthesia.  Pt states that her back problem is mainly on her L side.  Pt landed on her L knee when she fell.  No other falls within the last 6 months. Pt also states being very dizzy since her L knee surgery.  Had PT treatement for her dizziness before which did not help.  The room feels like it is spinning when she gets dizzy.  I feel like it is getting better then she does something.     Patient Stated Goals  Be better able to get into and out of her vehicle (midsized truck), into and out of chairs, be better able to roll in her bed with less L knee pain. Be able to get down on the floor and get back up.     Currently in Pain?  Yes    Pain Score  3     Pain Location  Back    Pain Onset  More than a month ago                               PT Education - 04/21/18 1304    Education Details  ther-ex    Person(s) Educated  Patient    Methods  Explanation;Demonstration;Tactile cues;Verbal cues    Comprehension  Returned demonstration;Verbalized understanding      Objectives   Difficulty with progresswith painsecondary to multiple areas of chronic pain.   MedbridgeAccess Code: [email or web address]  Latex free bands used  Pt also wants to be better able to step over her bath tub to get into her shower.  Pt also adds that she wants to work on floor to stand transfers.  Sitting posture: L trunk rotation  Standing posture: slight L lateral shift   Therapeutic exercise  Standing back extension 10x5 seconds    Side stepping 32 ft to the R and 32 ft to the L x 2  Forward wedding march 32 ft. Good  glute muscle use felt   Running man L LE with  one UE assist 10x3. Good glute and quadriceps muscle use felt.   Cues for femoral control   Not yet ready for HEP.   Seated L hip ER 10x3  Seated hip adduction ball and glute max squeeze 10x2 with 5 seconds   L lateral step up with B UE assist 4 inch step 10x3  Standing hip abduction 3 lbs with B UE assist  L 10x2 R 10x2   Seated thoracic extension on chair withhands on lap10x5 seconds for 3 sets to decrease low back extension pressure   Seated manually resisted L scapular retraction targeting the lower trap muscle to decrease L upper trap tension 10x5 seconds for 3 sets  Standing L lateral shift correction 10x5 seconds for 2 sets             Decreased feeling of being "stuck" on R low back afterwards    SLS with B UE assist to promote glute med muscle use L LE 10x5 seconds for 2 sets, emphasis on level pelvis      Improved exercise technique, movement at target joints, use of target muscles aftermin tomod verbal, visual, tactile cues.  Pt states back and L knee are doing pretty good after session. No complain of pain.    Back and L knee pain doing well based on pt subjective reports. Continued working on L glute med and max strengthening, thoracic extension, scapular strengthening, and pelvic control to decrease stress to low back and decrease L knee valgus with standing tasks. No complain of pain after session. Pt will benefit from continued skilled physical therapy services to decrease low back and L knee pain, improve strength and function.      PT Short Term Goals - 12/12/17 0930      PT SHORT TERM GOAL #1   Title  Patient will be independent with her HEP to help decrease back and L knee pain and improve ability to perform functional tasks.     Time  3    Period  Weeks    Status  Achieved    Target Date  12/12/17        PT Long Term Goals -  04/01/18 1509      PT LONG TERM GOAL #1   Title  Patient will have a decrease in back pain to 3/10 or less at worst to promote ability to ambulate, turn in bed, perform standing tasks with less pain.     Baseline  6/10 back pain at most (09/24/2017); 7-8/10 at most for the past 7 days, duration of pain is better since starting PT (10/29/2017); 5-6/10 at most for the past 7 days (11/18/2017); 8/10 at most for the past 7 days but the duration of pain is less compared to prior to starting PT (12/12/2017).  7/10 back pain at most for the past 7 days (pt states doing her exercises helps decrease her pain; 01/02/2018); walking (3/10), standing (4/10), bed mobility (0/10) 02/05/2018; 6/10 back pain at most for the past 7 days (03/03/2018); 6.5/10 back pain at most for the past 7 days (03/27/2018)    Time  8    Period  Weeks    Status  On-going    Target Date  05/01/18      PT LONG TERM GOAL #2   Title  Patient will have a decrease in L knee pain to 3/10 or less at worst to promote ability to ambulate, negotiate stairs, get into and out of a car more comfortably.  Baseline  6/10 L knee pain at most for the past 3 months (09/24/2017); 7-8/10 at most for the past 7 days, duration of pain is better since starting PT (10/29/2017); 7/10 at most for the past 7 days (11/18/2017); 6/10 L knee pain at most for the past 7 days (12/12/2017); 5/10 L knee pain at most for the past 7 days (pt states doing her exercises helps decrease pain; 01/02/2018); L knee 3/10 (02/05/2018); 4/10 at most for the past 7 days (03/03/2018); 5.5/10 L knee at most for the past 7 days (03/27/2018)    Time  8    Period  Weeks    Status  Partially Met    Target Date  05/01/18      PT LONG TERM GOAL #3   Title  Patient will improve TUG time to 12 seconds or less as a demonstration of improved functional mobility and balance.     Baseline  TUG no AD: 16.05 seconds on average (09/24/2017); 14.3 seconds on average (10/29/2017); 13 seconds average (11/21/2017);  16.67 seconds average (12/12/2017);  14.17 seconds average (01/02/2018); 11.8 sec sonds (02/05/2018)    Time  6    Period  Weeks    Status  Achieved      PT LONG TERM GOAL #4   Title  Patient will improve her back FOTO score by at least 10 points as a demonstration of improved funtion.      Baseline  Back FOTO: 33 (09/24/2017); 38 (10/29/2017); 36 (11/21/2017); 40 (12/12/2017); 42 (01/02/2018); 47 (03/03/2018)    Time  6    Period  Weeks    Status  Achieved      PT LONG TERM GOAL #5   Title  Patient will improve bilateral LE strength by at least 1/2 MMT grade to promote ability to perform standing tasks.     Time  6    Period  Weeks    Status  Achieved      PT LONG TERM GOAL #6   Title  Patient will improve her Modified Oswestry Low Back pain disablity questionnaire by at least 10% as a demonstration of improved function.     Baseline  48% (09/24/2017); 46% (10/29/2017), (11/21/2017); 56% (12/12/2017); 36% (01/02/2018), (03/03/2018)    Time  6    Period  Weeks    Status  Achieved      PT LONG TERM GOAL #7   Title  Pt will report decreased difficulty stepping into and out of her bathtub as well as getting into and out of her truck to promote ability to get into and out of places.     Baseline  Pt reports difficulty stepping into and out of her bathtub as well as difficulty getting into and out of her truck (01/02/2018); Pt states the truck and bath tub and difficult and painful.  (02/05/2018); Improving per subjective reports (03/03/2018); decreased difficulty with truck tranfser after cues for technique and practice, decreasing difficulty with stepping into and out of the bath tub based on subjective reports (03/27/2018 and 04/01/2018)    Time  8    Period  Weeks    Status  Partially Met    Target Date  05/01/18      PT LONG TERM GOAL #8   Title  Patient will improve her BERG balance test score to 46/56 or more as a demonstration of decreased fall risk     Baseline  42/56 (01/02/2018); 54/56  (02/05/2018)    Time  6  Period  Weeks    Status  Achieved      PT LONG TERM GOAL  #9   TITLE  Pt will report minimal to no dizziness related to vertigo to promote balance and decrease fall risk.     Baseline  Pt reports dizziness (related to vertigo) which seem to affect her balance (01/15/2018); Patient reports "a little bit every now and then" (02/05/2018)    Time  6    Period  Weeks    Status  Achieved            Plan - 04/21/18 1256    Clinical Impression Statement  Back and L knee pain doing well based on pt subjective reports. Continued working on L glute med and max strengthening, thoracic extension, scapular strengthening, and pelvic control to decrease stress to low back and decrease L knee valgus with standing tasks. No complain of pain after session. Pt will benefit from continued skilled physical therapy services to decrease low back and L knee pain, improve strength and function.     Rehab Potential  Fair    Clinical Impairments Affecting Rehab Potential  (-) chronicity of condition, multiple areas of pain, medical history, age; (+) motivated, husband support    PT Frequency  2x / week    PT Duration  8 weeks    PT Treatment/Interventions  Therapeutic activities;Therapeutic exercise;Balance training;Neuromuscular re-education;Patient/family education;Manual techniques;Dry needling;Aquatic Therapy;Electrical Stimulation;Iontophoresis 91m/ml Dexamethasone;Gait training;Canalith Repostioning;Vestibular    PT Next Visit Plan  canalith repositioning, hip and knee strengthening, core strengthening, patellar mobility, manual techniques, modalities PRN    Consulted and Agree with Plan of Care  Patient       Patient will benefit from skilled therapeutic intervention in order to improve the following deficits and impairments:  Pain, Improper body mechanics, Postural dysfunction, Dizziness, Decreased strength, Difficulty walking, Decreased balance  Visit Diagnosis: History of  falling  Radiculopathy, lumbar region  Muscle weakness (generalized)  Chronic pain of left knee     Problem List Patient Active Problem List   Diagnosis Date Noted  . Obese 06/20/2016  . Status post right knee replacement 06/19/2016  . S/P left TKA 06/18/2016  . Spinal stenosis of lumbar region 09/14/2015    MJoneen BoersPT, DPT   04/21/2018, 1:49 PM  [facility] [address] [place] [address]Phone: [phone]  Fax:  [phone] Name: RSUMMERS BUENDIAMRN: [id number] of Birth: 404-Jul-1952

## 2018-04-29 ENCOUNTER — Ambulatory Visit: Payer: Medicare Other

## 2018-05-06 ENCOUNTER — Ambulatory Visit: Payer: Medicare Other

## 2018-05-07 ENCOUNTER — Ambulatory Visit: Payer: Medicare Other

## 2018-05-08 ENCOUNTER — Ambulatory Visit: Payer: Medicare Other | Attending: Orthopedic Surgery

## 2018-05-08 ENCOUNTER — Other Ambulatory Visit: Payer: Self-pay

## 2018-05-08 DIAGNOSIS — M6281 Muscle weakness (generalized): Secondary | ICD-10-CM | POA: Diagnosis present

## 2018-05-08 DIAGNOSIS — R42 Dizziness and giddiness: Secondary | ICD-10-CM

## 2018-05-08 DIAGNOSIS — Z9181 History of falling: Secondary | ICD-10-CM | POA: Diagnosis present

## 2018-05-08 DIAGNOSIS — M5416 Radiculopathy, lumbar region: Secondary | ICD-10-CM

## 2018-05-08 DIAGNOSIS — M25562 Pain in left knee: Secondary | ICD-10-CM | POA: Diagnosis present

## 2018-05-08 DIAGNOSIS — G8929 Other chronic pain: Secondary | ICD-10-CM | POA: Diagnosis present

## 2018-05-08 NOTE — Therapy (Signed)
[facility] [address] [place], [address] Phone: [phone]   Fax:  [phone]  Physical Therapy Treatment  Patient Details  Name: Destiny Shaw MRN: [id number] Date of Birth: May 10, 1950 Referring Provider (PT): Paralee Cancel, MD   Encounter Date: 05/08/2018  PT End of Session - 05/08/18 1456    Visit Number  41    Number of Visits  74    Date for PT Re-Evaluation  08/28/18    Authorization Type  4    Authorization Time Period  of 10 progress note Medicare    PT Start Time  3825    PT Stop Time  1545    PT Time Calculation (min)  48 min    Activity Tolerance  Patient tolerated treatment well    Behavior During Therapy  [facility] for tasks assessed/performed       Past Medical History:  Diagnosis Date  . ADD (attention deficit disorder)   . Anemia   . Anginal pain ([place])    pt states has occas chest pain relates to indigestion; pt uses rest to relieve   . Anxiety   . Arthritis   . Concussion   . Depression   . Diabetes mellitus without complication ([place])   . Dizziness   . Fall   . GERD (gastroesophageal reflux disease)   . Headache   . History of urinary tract infection   . Hyperlipidemia   . Hypertension   . Hypothyroidism   . IBS (irritable bowel syndrome)   . Imbalance   . Numbness    right leg   . Numbness in both hands    comes and goes   . Pneumonia    last episode approx 1 year ago   . Sleep apnea   . Wears glasses     Past Surgical History:  Procedure Laterality Date  . BREAST CYST ASPIRATION Left 1980's   neg  . BREAST CYST EXCISION Right 1980's   neg  . BREAST LUMPECTOMY Right   . BREAST SURGERY    . CARPAL TUNNEL RELEASE    . CESAREAN SECTION     times 2  . COLONOSCOPY WITH PROPOFOL N/A 05/11/2015   Procedure: COLONOSCOPY WITH PROPOFOL;  Surgeon: Manya Silvas, MD;  Location: [facility] ENDOSCOPY;  Service: Endoscopy;  Laterality: N/A;  . DE QUERVAIN'S RELEASE Right   .  ESOPHAGOGASTRODUODENOSCOPY (EGD) WITH PROPOFOL N/A 05/11/2015   Procedure: ESOPHAGOGASTRODUODENOSCOPY (EGD) WITH PROPOFOL;  Surgeon: Manya Silvas, MD;  Location: [facility] ENDOSCOPY;  Service: Endoscopy;  Laterality: N/A;  . EYE SURGERY     laser surgery bilat   . HERNIA REPAIR    . KNEE ARTHROSCOPY    . LUMBAR LAMINECTOMY/DECOMPRESSION MICRODISCECTOMY Bilateral 09/14/2015   Procedure: MICRO LUMBAR BILATERAL DECOMPRESSION L4 - L5;  Surgeon: Susa Day, MD;  Location: [facility] ORS;  Service: Orthopedics;  Laterality: Bilateral;  . REDUCTION MAMMAPLASTY Bilateral 1980  . RHINOPLASTY    . TONSILLECTOMY    . TOTAL KNEE ARTHROPLASTY Left 06/18/2016   Procedure: LEFT TOTAL KNEE ARTHROPLASTY;  Surgeon: Paralee Cancel, MD;  Location: [facility] ORS;  Service: Orthopedics;  Laterality: Left;  Adductor Block  . TUBAL LIGATION    . UVULOPALATOPHARYNGOPLASTY      There were no vitals filed for this visit.  Subjective Assessment - 05/08/18 1458    Subjective  Back has been giving her a good bit of trouble. L knee gets sore. Back usually feels sore when she  stands and cooks in the kitchen an twists.  0/10 currently (pt sitting), 7-8/10 back pain at most for the past 7 days.  6-7/10 L knee pain at most for the past 7 days.  Feels better than it was before.  Stepping over stuff is better.  Able to get into and out of her truck without problems.  Able to walk up 4 steps without trouble.     Pertinent History  Low back pain. Pt states having back surgery about 2-3 years ago. After she had her L TKA, her back started aggravating her due to her walking. Pt also fell about 4 weeks ago onto her L knee. Dr. Alvan Dame checked out her L knee. L knee was fine but has some inflammation.  Pt tries to keep it iced. Pt still recovering for her L TKA but the fall set her back.   Her L knee surgery was last year.  L knee still bothers her a lot.  Had surgery for her back before her L knee surgery. Was doing well until her L knee surgery. The fall  made it worse. Feels debilitating. Also has a hard time getting into and out of the car mainly due to her L knee.   Pt fell due to her L knee bucking on her. Pt was trying to pick something up from the side of the couch.  Also has a hard time getting up from the floor.  Pt states having bladder accidents since after her fall (pt was recommended to tell her MD about it).  Bowel problems since taking medications (was constapated, but after taking medications, pt had diarrhea which was difficult to control. Getting out of the metformin fixed the bowel issue).  Pt states tingling and numbness L lateral LE along the L5 dermatome.  Denies saddle anesthesia.  Pt states that her back problem is mainly on her L side.  Pt landed on her L knee when she fell.  No other falls within the last 6 months. Pt also states being very dizzy since her L knee surgery.  Had PT treatement for her dizziness before which did not help.  The room feels like it is spinning when she gets dizzy.  I feel like it is getting better then she does something.     Patient Stated Goals  Be better able to get into and out of her vehicle (midsized truck), into and out of chairs, be better able to roll in her bed with less L knee pain. Be able to get down on the floor and get back up.     Currently in Pain?  Yes    Pain Score  6     Pain Onset  More than a month ago                               PT Education - 05/08/18 1520    Education Details  ther-ex    [employer]) Educated  Patient    Methods  Explanation;Demonstration;Tactile cues;Verbal cues    Comprehension  Returned demonstration;Verbalized understanding      Objectives  MedbridgeAccess Code: [email or web address]  Difficulty with progresswith painsecondary to multiple areas of chronic pain.  Pt states wanting to do 1x/week for 16 weeks (04/15/2018)   Latex free bands used   Pt also adds that she wants to work on floor to stand transfers.  Sitting  posture: L trunk rotation  Standing posture: slightLlateral shift  Last scheduled appt 08/26/2018  Pt states that pressing her back against the chair feels better.   Manual therapy Seated STM L vastus lateralis, vastus medialis, lateral hamstrings, IT band   Therapeutic exercise   Standing trunk flexion 10x   No pain  Standing back extension 1x. Low back discomfort  NuStep seat 6, arms 6 for 5 min to promote joint movement and nutrition   Decreased back pain with movement  Seated L knee flexion AAROM with ball 10x3  Stepping over 16.5 inch T-band strap obstacle to simulate stepping over bath tub  5x. Difficulty secondary to limited L knee flexion, L hip flexion, L ankle DF  Then 3x after ankle DF ROM. Better clearance.   Standing ankle DF/PF with B UE assist 2 minutes to promote ankle DF ROM  Better able to clear T-band strap obstacle when stepping over it.   Pt was recommended to move such as walkig when her back bothers her to help decrease pain. Pt verbalized understanding.   Standing gasttroc stretch at wall 15 seconds x 5   HEP  Improved exercise technique, movement at target joints, use of target muscles after mod verbal, visual, tactile cues.   Response to treatment Decreased back pain with movement. Decreased L knee pain with soft tissue techniques to decrease muscle tension. Improved    Clinical impression Worked on decreasing muscle tension L vastus lateralis, lateral hamstrings, as well as IT band to help decrease tension at the knee and promote better mechanics when performing standing tasks. Worked on ankle DF to promote foot clearance when trying to step over theraband to simulate stepping over her bath tub. Decreased back pain with movement. 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 Back and L knee pain still gives her problems but movement and decreasing soft tissue tension seems to help. Difficulty with  progress with back and L knee pain due to chronicity of condition., Pt however has made very good progress with physical therapy towards her other functional and balance goals. Pt will benefit from continued skilled physical therapy services to decrease pain, improve strength, function and per pt input.         PT Short Term Goals - 12/12/17 0930      PT SHORT TERM GOAL #1   Title  Patient will be independent with her HEP to help decrease back and L knee pain and improve ability to perform functional tasks.     Time  3    Period  Weeks    Status  Achieved    Target Date  12/12/17        PT Long Term Goals - 05/08/18 1520      PT LONG TERM GOAL #1   Title  Patient will have a decrease in back pain to 3/10 or less at worst to promote ability to ambulate, turn in bed, perform standing tasks with less pain.     Baseline  6/10 back pain at most (09/24/2017); 7-8/10 at most for the past 7 days, duration of pain is better since starting PT (10/29/2017); 5-6/10 at most for the past 7 days (11/18/2017); 8/10 at most for the past 7 days but the duration of pain is less compared to prior to starting PT (12/12/2017).  7/10 back pain at most for the past 7 days (pt states doing her exercises helps decrease her pain; 01/02/2018); walking (3/10), standing (4/10), bed mobility (0/10) 02/05/2018; 6/10 back pain at most for the  past 7 days (03/03/2018); 6.5/10 back pain at most for the past 7 days (03/27/2018); 7-8/10 at most (05/08/2018)    Time  16    Period  Weeks    Status  On-going    Target Date  08/28/18      PT LONG TERM GOAL #2   Title  Patient will have a decrease in L knee pain to 3/10 or less at worst to promote ability to ambulate, negotiate stairs, get into and out of a car more comfortably.     Baseline  6/10 L knee pain at most for the past 3 months (09/24/2017); 7-8/10 at most for the past 7 days, duration of pain is better since starting PT (10/29/2017); 7/10 at most for the past 7 days  (11/18/2017); 6/10 L knee pain at most for the past 7 days (12/12/2017); 5/10 L knee pain at most for the past 7 days (pt states doing her exercises helps decrease pain; 01/02/2018); L knee 3/10 (02/05/2018); 4/10 at most for the past 7 days (03/03/2018); 5.5/10 L knee at most for the past 7 days (03/27/2018); 6-7/10 L knee pain at most (05/08/2018)    Time  16    Period  Weeks    Status  On-going    Target Date  08/28/18      PT LONG TERM GOAL #3   Title  Patient will improve TUG time to 12 seconds or less as a demonstration of improved functional mobility and balance.     Baseline  TUG no AD: 16.05 seconds on average (09/24/2017); 14.3 seconds on average (10/29/2017); 13 seconds average (11/21/2017); 16.67 seconds average (12/12/2017);  14.17 seconds average (01/02/2018); 11.8 sec sonds (02/05/2018)    Time  6    Period  Weeks    Status  Achieved      PT LONG TERM GOAL #4   Title  Patient will improve her back FOTO score by at least 10 points as a demonstration of improved funtion.      Baseline  Back FOTO: 33 (09/24/2017); 38 (10/29/2017); 36 (11/21/2017); 40 (12/12/2017); 42 (01/02/2018); 47 (03/03/2018)    Time  6    Period  Weeks    Status  Achieved      PT LONG TERM GOAL #5   Title  Patient will improve bilateral LE strength by at least 1/2 MMT grade to promote ability to perform standing tasks.     Time  6    Period  Weeks    Status  Achieved      PT LONG TERM GOAL #6   Title  Patient will improve her Modified Oswestry Low Back pain disablity questionnaire by at least 10% as a demonstration of improved function.     Baseline  48% (09/24/2017); 46% (10/29/2017), (11/21/2017); 56% (12/12/2017); 36% (01/02/2018), (03/03/2018)    Time  6    Period  Weeks    Status  Achieved      PT LONG TERM GOAL #7   Title  Pt will report decreased difficulty stepping into and out of her bathtub as well as getting into and out of her truck to promote ability to get into and out of places.     Baseline  Pt reports  difficulty stepping into and out of her bathtub as well as difficulty getting into and out of her truck (01/02/2018); Pt states the truck and bath tub and difficult and painful.  (02/05/2018); Improving per subjective reports (03/03/2018); decreased difficulty with truck tranfser after  cues for technique and practice, decreasing difficulty with stepping into and out of the bath tub based on subjective reports (03/27/2018 and 04/01/2018); Decreasing difficulty stepping into a bathtub. No difficulty getting into and out of her truck (05/08/2018)    Time  16    Period  Weeks    Status  Partially Met    Target Date  08/28/18      PT LONG TERM GOAL #8   Title  Patient will improve her BERG balance test score to 46/56 or more as a demonstration of decreased fall risk     Baseline  42/56 (01/02/2018); 54/56 (02/05/2018)    Time  6    Period  Weeks    Status  Achieved      PT LONG TERM GOAL  #9   TITLE  Pt will report minimal to no dizziness related to vertigo to promote balance and decrease fall risk.     Baseline  Pt reports dizziness (related to vertigo) which seem to affect her balance (01/15/2018); Patient reports "a little bit every now and then" (02/05/2018)    Time  6    Period  Weeks    Status  Achieved            Plan - 05/08/18 1456    Clinical Impression Statement  Worked on decreasing muscle tension L vastus lateralis, lateral hamstrings, as well as IT band to help decrease tension at the knee and promote better mechanics when performing standing tasks. Worked on ankle DF to promote foot clearance when trying to step over theraband to simulate stepping over her bath tub. Decreased back pain with movement. 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 Back and L knee pain still gives her problems but movement and decreasing soft tissue tension seems to help. Difficulty with progress with back and L knee pain due to chronicity of condition., Pt however  has made very good progress with physical therapy towards her other functional and balance goals. Pt will benefit from continued skilled physical therapy services to decrease pain, improve strength, function and per pt input.     Personal Factors and Comorbidities  Age;Past/Current Experience;Time since onset of injury/illness/exacerbation    Examination-Activity Limitations  Stairs    Stability/Clinical Decision Making  Stable/Uncomplicated    Clinical Decision Making  Low   pt making progress towards functioal goals   Clinical Presentation due to:  pt making progress towards functioal goals    Rehab Potential  Fair    Clinical Impairments Affecting Rehab Potential  (-) chronicity of condition, multiple areas of pain, medical history, age; (+) motivated, husband support    PT Frequency  1x / week    PT Duration  Other (comment)   16 weeks per pt request   PT Treatment/Interventions  Therapeutic activities;Therapeutic exercise;Balance training;Neuromuscular re-education;Patient/family education;Manual techniques;Dry needling;Aquatic Therapy;Electrical Stimulation;Iontophoresis 53m/ml Dexamethasone;Gait training;Canalith Repostioning;Vestibular    PT Next Visit Plan   hip and knee strengthening, core strengthening, patellar mobility, manual techniques, modalities PRN    Consulted and Agree with Plan of Care  Patient       Patient will benefit from skilled therapeutic intervention in order to improve the following deficits and impairments:  Pain, Improper body mechanics, Postural dysfunction, Dizziness, Decreased strength, Difficulty walking, Decreased balance  Visit Diagnosis: Radiculopathy, lumbar region - Plan: PT plan of care cert/re-cert  History of falling - Plan: PT plan of care cert/re-cert  Muscle weakness (generalized) - Plan: PT plan  of care cert/re-cert  Chronic pain of left knee - Plan: PT plan of care cert/re-cert  Dizziness and giddiness - Plan: PT plan of care  cert/re-cert     Problem List Patient Active Problem List   Diagnosis Date Noted  . Obese 06/20/2016  . Status post right knee replacement 06/19/2016  . S/P left TKA 06/18/2016  . Spinal stenosis of lumbar region 09/14/2015    Joneen Boers PT, DPT   05/08/2018, 8:39 PM  [facility] [address] [place], [address] Phone: [phone]   Fax:  [phone]  Name: Destiny Shaw MRN: [id number] Date of Birth: Aug 03, 1950

## 2018-05-13 ENCOUNTER — Ambulatory Visit: Payer: Medicare Other

## 2018-05-20 ENCOUNTER — Ambulatory Visit: Payer: Medicare Other

## 2018-06-11 NOTE — Therapy (Signed)
[facility] [address], [place], [address] Phone: [phone]   Fax:  [phone]  Patient Details  Name: Destiny Shaw MRN: [id number] Date of Birth: 1951-01-16 Referring Provider:  No ref. provider found  Encounter Date: 06/11/2018   The [facility] outpatient clinics are closed at this time due to the COVID-19 epidemic. The patient was contacted in regards to their therapy services. The patient is in agreement that they are safe and consent to being on hold for therapy services until the [facility] outpatient facilities reopen. At which time, the patient will be contacted to schedule an appointment to resume therapy services.      Joneen Boers PT, DPT   06/11/2018, 2:48 PM  [facility] [address] [place], [address] Phone: [phone]   Fax:  [phone]

## 2018-07-22 ENCOUNTER — Ambulatory Visit: Payer: Medicare Other | Attending: Orthopedic Surgery

## 2018-07-22 ENCOUNTER — Other Ambulatory Visit: Payer: Self-pay

## 2018-07-22 DIAGNOSIS — M6281 Muscle weakness (generalized): Secondary | ICD-10-CM | POA: Insufficient documentation

## 2018-07-22 DIAGNOSIS — M5416 Radiculopathy, lumbar region: Secondary | ICD-10-CM

## 2018-07-22 DIAGNOSIS — R42 Dizziness and giddiness: Secondary | ICD-10-CM | POA: Diagnosis present

## 2018-07-22 DIAGNOSIS — Z9181 History of falling: Secondary | ICD-10-CM | POA: Diagnosis not present

## 2018-07-22 DIAGNOSIS — G8929 Other chronic pain: Secondary | ICD-10-CM | POA: Diagnosis present

## 2018-07-22 DIAGNOSIS — M25562 Pain in left knee: Secondary | ICD-10-CM | POA: Insufficient documentation

## 2018-07-22 NOTE — Therapy (Signed)
[facility] [address] [place], [address] Phone: [phone]   Fax:  [phone]  Physical Therapy Treatment Progress Report And Recertification  Patient Details  Name: Destiny Shaw MRN: [id number] Date of Birth: 1950-06-25 Referring Provider (PT): Paralee Cancel, MD   Encounter Date: 07/22/2018  PT End of Session - 07/22/18 1401    Visit Number  42    Number of Visits  63    Date for PT Re-Evaluation  09/04/18    Authorization Type  5    Authorization Time Period  of 10 progress note Medicare    PT Start Time  1402    PT Stop Time  1451    PT Time Calculation (min)  49 min    Activity Tolerance  Patient tolerated treatment well    Behavior During Therapy  [facility] for tasks assessed/performed       Past Medical History:  Diagnosis Date  . ADD (attention deficit disorder)   . Anemia   . Anginal pain ([place])    pt states has occas chest pain relates to indigestion; pt uses rest to relieve   . Anxiety   . Arthritis   . Concussion   . Depression   . Diabetes mellitus without complication ([place])   . Dizziness   . Fall   . GERD (gastroesophageal reflux disease)   . Headache   . History of urinary tract infection   . Hyperlipidemia   . Hypertension   . Hypothyroidism   . IBS (irritable bowel syndrome)   . Imbalance   . Numbness    right leg   . Numbness in both hands    comes and goes   . Pneumonia    last episode approx 1 year ago   . Sleep apnea   . Wears glasses     Past Surgical History:  Procedure Laterality Date  . BREAST CYST ASPIRATION Left 1980's   neg  . BREAST CYST EXCISION Right 1980's   neg  . BREAST LUMPECTOMY Right   . BREAST SURGERY    . CARPAL TUNNEL RELEASE    . CESAREAN SECTION     times 2  . COLONOSCOPY WITH PROPOFOL N/A 05/11/2015   Procedure: COLONOSCOPY WITH PROPOFOL;  Surgeon: Manya Silvas, MD;  Location: [facility] ENDOSCOPY;  Service: Endoscopy;  Laterality: N/A;   . DE QUERVAIN'S RELEASE Right   . ESOPHAGOGASTRODUODENOSCOPY (EGD) WITH PROPOFOL N/A 05/11/2015   Procedure: ESOPHAGOGASTRODUODENOSCOPY (EGD) WITH PROPOFOL;  Surgeon: Manya Silvas, MD;  Location: [facility] ENDOSCOPY;  Service: Endoscopy;  Laterality: N/A;  . EYE SURGERY     laser surgery bilat   . HERNIA REPAIR    . KNEE ARTHROSCOPY    . LUMBAR LAMINECTOMY/DECOMPRESSION MICRODISCECTOMY Bilateral 09/14/2015   Procedure: MICRO LUMBAR BILATERAL DECOMPRESSION L4 - L5;  Surgeon: Susa Day, MD;  Location: [facility] ORS;  Service: Orthopedics;  Laterality: Bilateral;  . REDUCTION MAMMAPLASTY Bilateral 1980  . RHINOPLASTY    . TONSILLECTOMY    . TOTAL KNEE ARTHROPLASTY Left 06/18/2016   Procedure: LEFT TOTAL KNEE ARTHROPLASTY;  Surgeon: Paralee Cancel, MD;  Location: [facility] ORS;  Service: Orthopedics;  Laterality: Left;  Adductor Block  . TUBAL LIGATION    . UVULOPALATOPHARYNGOPLASTY      There were no vitals filed for this visit.  Subjective Assessment - 07/22/18 1407    Subjective  Has been trying to walk. Was working out ok but got to the point where she  lost energy. Back started to give her spasms again. Pt thinks it came from not moving enough. Quit going outside to exercise because it was raining. Has been trying to do some things but everything seems to hurt. Wants to walk somewhere where its flat. Her neighborhood [place] which bothers her.  3/10 back currently (has back spasms last night going up to 6-7/10). Forgot a lot of her back exercises. 7/10 back pain at worst for the past month.  3-4/10 L knee while walking from waiting room to treatment room at the knee cap.  Has not been doing the manual therapy to the L knee cap. 6-7/10 L knee pain most for the past month.  Pt also adds that she fell 3 weeks ago.  Pt ran into the car tire (car on her husbands car lift in her garage). Pt hit her head onto the elevated car's tire and fell backwards onto her R hip.  Still sore. R hip is getting better.   Has not been doing her HEP because of her R hip soreness as well.  Getting into and out of her trunk is no problem. Unable to step into her bath tub.     Pertinent History  Low back pain. Pt states having back surgery about 2-3 years ago. After she had her L TKA, her back started aggravating her due to her walking. Pt also fell about 4 weeks ago onto her L knee. Dr. Alvan Dame checked out her L knee. L knee was fine but has some inflammation.  Pt tries to keep it iced. Pt still recovering for her L TKA but the fall set her back.   Her L knee surgery was last year.  L knee still bothers her a lot.  Had surgery for her back before her L knee surgery. Was doing well until her L knee surgery. The fall made it worse. Feels debilitating. Also has a hard time getting into and out of the car mainly due to her L knee.   Pt fell due to her L knee bucking on her. Pt was trying to pick something up from the side of the couch.  Also has a hard time getting up from the floor.  Pt states having bladder accidents since after her fall (pt was recommended to tell her MD about it).  Bowel problems since taking medications (was constapated, but after taking medications, pt had diarrhea which was difficult to control. Getting out of the metformin fixed the bowel issue).  Pt states tingling and numbness L lateral LE along the L5 dermatome.  Denies saddle anesthesia.  Pt states that her back problem is mainly on her L side.  Pt landed on her L knee when she fell.  No other falls within the last 6 months. Pt also states being very dizzy since her L knee surgery.  Had PT treatement for her dizziness before which did not help.  The room feels like it is spinning when she gets dizzy.  I feel like it is getting better then she does something.     Patient Stated Goals  Be better able to get into and out of her vehicle (midsized truck), into and out of chairs, be better able to roll in her bed with less L knee pain. Be able to get down on the floor  and get back up.     Currently in Pain?  Yes    Pain Score  4    3/10 low back, 3-4/10  L knee   Pain Onset  More than a month ago                               PT Education - 07/22/18 1419    Education Details  ther-ex    Person(s) Educated  Patient    Methods  Explanation;Demonstration;Tactile cues;Verbal cues    Comprehension  Returned demonstration;Verbalized understanding        Objectives  The patient has been informed of current processes in place at Outpatient Rehab to protect patients from Covid-19 exposure including social distancing, schedule modifications, and new cleaning procedures. After discussing their particular risk with a therapist based on the patient's personal risk factors, the patient has decided to proceed with in-person therapy.    MedbridgeAccess Code: [email or web address]   Latex free bands used   Pt also adds that she wants to work on floor to stand transfers.  Sitting posture: L trunk rotation  Standing posture: slightLlateral shift  Pt states that pressing her back against the chair feels better.   Manual therapy Reviewed seated medial glide L patella  Seated STM L vastus lateralis, vastus medialis, lateral hamstrings, IT band  Seated medial glide L patella grade 3 to 3+  111 degrees seated L knee flexion AROM afterwards    Therapeutic exercise  Seated B shoulder extension, hands on thighs 10x5 seconds for 2 sets  Decreased back pain and spasms  Reviewed the aforementioned home exercise with pt  Pt was recommended to tell her doctor about her R hip if her pain increases/gets worse. Pt verbalized understanding   seated L knee flexion AROM: 100 degrees  R knee flexion 123 degrees  Seated L knee flexion PROM 10x.    L lateral knee discomfort. Pt education on decreasing lateral tightness with manual therapy to improve ROM.   Then AAROM with PT assist to end range knee flexion 10x2 after manual  therapy.    117 degrees seated L knee flexion AROM afterwards   Reviewed L knee AROM with pt.   Reviewed plan of care: 1x/week for 6 weeks   Improved exercise technique, movement at target joints, use of target muscles after mod verbal, visual, tactile cues.   Response to treatment  Decreased low back pain with trunk muscle activation. Decreased L knee pain and improved L knee flexion AROM after medial patellar glide, decreasing vastus lateralis, lateral hamstrings, and IT band tension. Pt states that walking feels so much better after session.   Clinical impression   Pt returns to PT after about a 2 month hiatus due to COVID-19 clinic closure. Pt currently demonstrates increased low back and L knee pain, limited L knee flexion AROM, as well as difficulty stepping over obstacles and stepping into and out of her bath tub. Pt also reports recent onset of R hip soreness/discomfort secondary to a fall after running onto her husbands car that was elevated on a car lift. R hip soreness improving per pt reports. Pt was recommended to see her doctor if it worsens. Reviewed some of there exercises and activities pt had as part of her HEP such as seated B shoulder extension isometrics and self medial patellar glide. Based on pt subjective, pt was unable to perform much of her HEP which may play a factor on increased pain. Worked on improving L knee fleixon AROM to promote ability to step over obstacles such as her bath tub as well as improving trunk  muscle activation to decrease back pain. Pt reports that walking feels much better after session. Pt will benefit from cotninued skilled physical therapy services to decrease pain, improve strength, knee ROM and function.        PT Short Term Goals - 12/12/17 0930      PT SHORT TERM GOAL #1   Title  Patient will be independent with her HEP to help decrease back and L knee pain and improve ability to perform functional tasks.     Time  3    Period   Weeks    Status  Achieved    Target Date  12/12/17        PT Long Term Goals - 07/22/18 1433      PT LONG TERM GOAL #1   Title  Patient will have a decrease in back pain to 3/10 or less at worst to promote ability to ambulate, turn in bed, perform standing tasks with less pain.     Baseline  6/10 back pain at most (09/24/2017); 7-8/10 at most for the past 7 days, duration of pain is better since starting PT (10/29/2017); 5-6/10 at most for the past 7 days (11/18/2017); 8/10 at most for the past 7 days but the duration of pain is less compared to prior to starting PT (12/12/2017).  7/10 back pain at most for the past 7 days (pt states doing her exercises helps decrease her pain; 01/02/2018); walking (3/10), standing (4/10), bed mobility (0/10) 02/05/2018; 6/10 back pain at most for the past 7 days (03/03/2018); 6.5/10 back pain at most for the past 7 days (03/27/2018); 7-8/10 at most (05/08/2018); 7/10 back pain at worst (07/22/2018)    Time  6    Period  Weeks    Status  On-going    Target Date  09/04/18      PT LONG TERM GOAL #2   Title  Patient will have a decrease in L knee pain to 3/10 or less at worst to promote ability to ambulate, negotiate stairs, get into and out of a car more comfortably.     Baseline  6/10 L knee pain at most for the past 3 months (09/24/2017); 7-8/10 at most for the past 7 days, duration of pain is better since starting PT (10/29/2017); 7/10 at most for the past 7 days (11/18/2017); 6/10 L knee pain at most for the past 7 days (12/12/2017); 5/10 L knee pain at most for the past 7 days (pt states doing her exercises helps decrease pain; 01/02/2018); L knee 3/10 (02/05/2018); 4/10 at most for the past 7 days (03/03/2018); 5.5/10 L knee at most for the past 7 days (03/27/2018); 6-7/10 L knee pain at most (05/08/2018); 7/10 L knee pain at worst for the past month (07/22/2018)    Time  6    Period  Weeks    Status  On-going    Target Date  09/04/18      PT LONG TERM GOAL #3   Title   Patient will improve TUG time to 12 seconds or less as a demonstration of improved functional mobility and balance.     Baseline  TUG no AD: 16.05 seconds on average (09/24/2017); 14.3 seconds on average (10/29/2017); 13 seconds average (11/21/2017); 16.67 seconds average (12/12/2017);  14.17 seconds average (01/02/2018); 11.8 sec sonds (02/05/2018)    Time  6    Period  Weeks    Status  Achieved      PT LONG TERM GOAL #4  Title  Patient will improve her back FOTO score by at least 10 points as a demonstration of improved funtion.      Baseline  Back FOTO: 33 (09/24/2017); 38 (10/29/2017); 36 (11/21/2017); 40 (12/12/2017); 42 (01/02/2018); 47 (03/03/2018)    Time  6    Period  Weeks    Status  Achieved      PT LONG TERM GOAL #5   Title  Patient will improve bilateral LE strength by at least 1/2 MMT grade to promote ability to perform standing tasks.     Time  6    Period  Weeks    Status  Achieved      PT LONG TERM GOAL #6   Title  Patient will improve her Modified Oswestry Low Back pain disablity questionnaire by at least 10% as a demonstration of improved function.     Baseline  48% (09/24/2017); 46% (10/29/2017), (11/21/2017); 56% (12/12/2017); 36% (01/02/2018), (03/03/2018)    Time  6    Period  Weeks    Status  Achieved      PT LONG TERM GOAL #7   Title  Pt will report decreased difficulty stepping into and out of her bathtub as well as getting into and out of her truck to promote ability to get into and out of places.     Baseline  Pt reports difficulty stepping into and out of her bathtub as well as difficulty getting into and out of her truck (01/02/2018); Pt states the truck and bath tub and difficult and painful.  (02/05/2018); Improving per subjective reports (03/03/2018); decreased difficulty with truck tranfser after cues for technique and practice, decreasing difficulty with stepping into and out of the bath tub based on subjective reports (03/27/2018 and 04/01/2018); Decreasing difficulty stepping  into a bathtub. No difficulty getting into and out of her truck (05/08/2018), (07/22/2018)    Time  6    Period  Weeks    Status  Partially Met    Target Date  09/04/18      PT LONG TERM GOAL #8   Title  Patient will improve her BERG balance test score to 46/56 or more as a demonstration of decreased fall risk     Baseline  42/56 (01/02/2018); 54/56 (02/05/2018)    Time  6    Period  Weeks    Status  Achieved      PT LONG TERM GOAL  #9   TITLE  Pt will report minimal to no dizziness related to vertigo to promote balance and decrease fall risk.     Baseline  Pt reports dizziness (related to vertigo) which seem to affect her balance (01/15/2018); Patient reports "a little bit every now and then" (02/05/2018)    Time  6    Period  Weeks    Status  Achieved            Plan - 07/22/18 1401    Clinical Impression Statement  Pt returns to PT after about a 2 month hiatus due to COVID-19 clinic closure. Pt currently demonstrates increased low back and L knee pain, limited L knee flexion AROM, as well as difficulty stepping over obstacles and stepping into and out of her bath tub. Pt also reports recent onset of R hip soreness/discomfort secondary to a fall after running onto her husbands car that was elevated on a car lift. R hip soreness improving per pt reports. Pt was recommended to see her doctor if it worsens. Reviewed some of there  exercises and activities pt had as part of her HEP such as seated B shoulder extension isometrics and self medial patellar glide. Based on pt subjective, pt was unable to perform much of her HEP which may play a factor on increased pain. Worked on improving L knee fleixon AROM to promote ability to step over obstacles such as her bath tub as well as improving trunk muscle activation to decrease back pain. Pt reports that walking feels much better after session. Pt will benefit from cotninued skilled physical therapy services to decrease pain, improve strength, knee  ROM and function.     Personal Factors and Comorbidities  Age;Past/Current Experience;Time since onset of injury/illness/exacerbation    Examination-Activity Limitations  Stairs    Stability/Clinical Decision Making  Stable/Uncomplicated    Clinical Decision Making  Low    Clinical Presentation due to:  Back and knee pain seem similar compared to previous reports. Pt maintains ability to get into and out of her truck.    Rehab Potential  Fair    Clinical Impairments Affecting Rehab Potential  (-) chronicity of condition, multiple areas of pain, medical history, age; (+) motivated, husband support    PT Frequency  1x / week    PT Duration  6 weeks    PT Treatment/Interventions  Therapeutic activities;Therapeutic exercise;Balance training;Neuromuscular re-education;Patient/family education;Manual techniques;Dry needling;Aquatic Therapy;Electrical Stimulation;Iontophoresis 45m/ml Dexamethasone;Gait training;Canalith Repostioning;Vestibular    PT Next Visit Plan   hip and knee strengthening, core strengthening, patellar mobility, manual techniques, modalities PRN    Consulted and Agree with Plan of Care  Patient       Patient will benefit from skilled therapeutic intervention in order to improve the following deficits and impairments:  Pain, Improper body mechanics, Postural dysfunction, Dizziness, Decreased strength, Difficulty walking, Decreased balance  Visit Diagnosis: History of falling - Plan: PT plan of care cert/re-cert  Radiculopathy, lumbar region - Plan: PT plan of care cert/re-cert  Muscle weakness (generalized) - Plan: PT plan of care cert/re-cert  Chronic pain of left knee - Plan: PT plan of care cert/re-cert  Dizziness and giddiness - Plan: PT plan of care cert/re-cert     Problem List Patient Active Problem List   Diagnosis Date Noted  . Obese 06/20/2016  . Status post right knee replacement 06/19/2016  . S/P left TKA 06/18/2016  . Spinal stenosis of lumbar region  09/14/2015   Thank you for your referral.  MJoneen BoersPT, DPT  07/22/2018, 5:31 PM   [facility] [address] [place] [address]Phone: [phone]  Fax:  [phone] Name: Destiny RUDNICKMRN: [id number] of Birth: [birth year]

## 2018-07-28 ENCOUNTER — Ambulatory Visit: Payer: Medicare Other | Attending: Orthopedic Surgery

## 2018-07-28 ENCOUNTER — Other Ambulatory Visit: Payer: Self-pay

## 2018-07-28 DIAGNOSIS — Z9181 History of falling: Secondary | ICD-10-CM

## 2018-07-28 DIAGNOSIS — M5416 Radiculopathy, lumbar region: Secondary | ICD-10-CM | POA: Diagnosis present

## 2018-07-28 DIAGNOSIS — M25562 Pain in left knee: Secondary | ICD-10-CM | POA: Insufficient documentation

## 2018-07-28 DIAGNOSIS — M6281 Muscle weakness (generalized): Secondary | ICD-10-CM | POA: Insufficient documentation

## 2018-07-28 DIAGNOSIS — G8929 Other chronic pain: Secondary | ICD-10-CM | POA: Insufficient documentation

## 2018-07-28 NOTE — Therapy (Signed)
[facility] [address] [place], [address] Phone: [phone]   Fax:  [phone]  Physical Therapy Treatment  Patient Details  Name: Destiny Shaw MRN: [id number] Date of Birth: 1950/09/23 Referring Provider (PT): Paralee Cancel, MD   Encounter Date: 07/28/2018  PT End of Session - 07/28/18 1401    Visit Number  43    Number of Visits  21    Date for PT Re-Evaluation  09/04/18    Authorization Type  6    Authorization Time Period  of 10 progress note Medicare    PT Start Time  1401    PT Stop Time  1450    PT Time Calculation (min)  49 min    Activity Tolerance  Patient tolerated treatment well    Behavior During Therapy  [facility] for tasks assessed/performed       Past Medical History:  Diagnosis Date  . ADD (attention deficit disorder)   . Anemia   . Anginal pain ([place])    pt states has occas chest pain relates to indigestion; pt uses rest to relieve   . Anxiety   . Arthritis   . Concussion   . Depression   . Diabetes mellitus without complication ([place])   . Dizziness   . Fall   . GERD (gastroesophageal reflux disease)   . Headache   . History of urinary tract infection   . Hyperlipidemia   . Hypertension   . Hypothyroidism   . IBS (irritable bowel syndrome)   . Imbalance   . Numbness    right leg   . Numbness in both hands    comes and goes   . Pneumonia    last episode approx 1 year ago   . Sleep apnea   . Wears glasses     Past Surgical History:  Procedure Laterality Date  . BREAST CYST ASPIRATION Left 1980's   neg  . BREAST CYST EXCISION Right 1980's   neg  . BREAST LUMPECTOMY Right   . BREAST SURGERY    . CARPAL TUNNEL RELEASE    . CESAREAN SECTION     times 2  . COLONOSCOPY WITH PROPOFOL N/A 05/11/2015   Procedure: COLONOSCOPY WITH PROPOFOL;  Surgeon: Manya Silvas, MD;  Location: [facility] ENDOSCOPY;  Service: Endoscopy;  Laterality: N/A;  . DE QUERVAIN'S RELEASE Right   .  ESOPHAGOGASTRODUODENOSCOPY (EGD) WITH PROPOFOL N/A 05/11/2015   Procedure: ESOPHAGOGASTRODUODENOSCOPY (EGD) WITH PROPOFOL;  Surgeon: Manya Silvas, MD;  Location: [facility] ENDOSCOPY;  Service: Endoscopy;  Laterality: N/A;  . EYE SURGERY     laser surgery bilat   . HERNIA REPAIR    . KNEE ARTHROSCOPY    . LUMBAR LAMINECTOMY/DECOMPRESSION MICRODISCECTOMY Bilateral 09/14/2015   Procedure: MICRO LUMBAR BILATERAL DECOMPRESSION L4 - L5;  Surgeon: Susa Day, MD;  Location: [facility] ORS;  Service: Orthopedics;  Laterality: Bilateral;  . REDUCTION MAMMAPLASTY Bilateral 1980  . RHINOPLASTY    . TONSILLECTOMY    . TOTAL KNEE ARTHROPLASTY Left 06/18/2016   Procedure: LEFT TOTAL KNEE ARTHROPLASTY;  Surgeon: Paralee Cancel, MD;  Location: [facility] ORS;  Service: Orthopedics;  Laterality: Left;  Adductor Block  . TUBAL LIGATION    . UVULOPALATOPHARYNGOPLASTY      There were no vitals filed for this visit.  Subjective Assessment - 07/28/18 1403    Subjective  Back has been hurting for days. L knee has been a little tight. Has been trying to do the exercises  for her L knee. Back feels better when she sits down and rest and does not move.  7/10 back pain 6-7/10 L knee pain when walking today.  Feels like the back spasms she had before her back surgery has returned.     Pertinent History  Low back pain. Pt states having back surgery about 2-3 years ago. After she had her L TKA, her back started aggravating her due to her walking. Pt also fell about 4 weeks ago onto her L knee. Dr. Alvan Dame checked out her L knee. L knee was fine but has some inflammation.  Pt tries to keep it iced. Pt still recovering for her L TKA but the fall set her back.   Her L knee surgery was last year.  L knee still bothers her a lot.  Had surgery for her back before her L knee surgery. Was doing well until her L knee surgery. The fall made it worse. Feels debilitating. Also has a hard time getting into and out of the car mainly due to her L knee.   Pt fell  due to her L knee bucking on her. Pt was trying to pick something up from the side of the couch.  Also has a hard time getting up from the floor.  Pt states having bladder accidents since after her fall (pt was recommended to tell her MD about it).  Bowel problems since taking medications (was constapated, but after taking medications, pt had diarrhea which was difficult to control. Getting out of the metformin fixed the bowel issue).  Pt states tingling and numbness L lateral LE along the L5 dermatome.  Denies saddle anesthesia.  Pt states that her back problem is mainly on her L side.  Pt landed on her L knee when she fell.  No other falls within the last 6 months. Pt also states being very dizzy since her L knee surgery.  Had PT treatement for her dizziness before which did not help.  The room feels like it is spinning when she gets dizzy.  I feel like it is getting better then she does something.     Patient Stated Goals  Be better able to get into and out of her vehicle (midsized truck), into and out of chairs, be better able to roll in her bed with less L knee pain. Be able to get down on the floor and get back up.     Currently in Pain?  Yes    Pain Score  7     Pain Onset  More than a month ago                               PT Education - 07/28/18 1443    Education Details  ther-ex, glute med and gait mechanics and back pain, HEP    Person(s) Educated  Patient    Methods  Explanation;Demonstration;Tactile cues;Verbal cues;Handout    Comprehension  Verbalized understanding;Returned demonstration        Objectives  The patient has been informed of current processes in place at [facility]patient [facility] to protect patients from Covid-19 exposure including social distancing, schedule modifications, and new cleaning procedures. After discussing their particular risk with a therapist based on the patient's personal risk factors, the patient has decided to proceed with in-person  therapy.    MedbridgeAccess Code: [email or web address]   Latex free bands used   Pt also adds that she wants to  work on floor to stand transfers.  Sitting posture: L trunk rotation  Standing posture: slightLlateral shift  Pt states that pressing her back against the chair feels better.   Gait observation: increased L lateral lean during L LE stance phase.   Manual therapy  Seated STM L vastus lateralis,  IT band  Seated medial glide L patella grade 3 to 3+             110 degrees seated L knee flexion AROM afterwards     Therapeutic exercise  Gait 64 ft  Then with 6 lbs on L hand64 ft, decreased L lateral lean  Then with 8 lbs on L hand 64 ft x 2. Decreased L lateral lean, pt states that her gait feels more normal   Pt education on glute med muscle weakness and mechanics with gait and back pain  L LE single leg stand without UE assist. Difficult  Then with pt holding 8 lb dumbbell L hand as a counterweight. No difficulty.   R S/L L hip abduction 8x3  Seated L knee flexion AROM 104 degrees   Seated L knee flexion AAROM with PT assist to end range knee flexion 10x2 after manual therapy.                          117 degrees seated L knee flexion AROM afterwards  Seated L knee flexion resisting yellow band 10x2  Standing L LE running man with R UE assist 10x   Good glute muscle use felt  Pt was recommended to activate her L glute muscles during L LE stance phase of gait. Pt verbalized understanding.   Improved exercise technique, movement at target joints, use of target muscles after mod verbal, visual, tactile cues.     Response to treatment Good glute and hamstrings use felt with exercises. Improved L knee flexion AROM after manual therapy, AAROM and increased hamstring muscle use. No back or L knee pain felt with gait after session.    Clinical impression Pt demonstrates increased L lateral lean compensation due to L glute med muscle  weakness during L LE stance phase of gait. Worked on improving strength and gave S/L L hip abduction exercise as part of her HEP. Continued working on improving L knee flexion strength and AROM to promote ability to step over areas such as stepping into her bath tub. No complain of L knee or back pain after session. Pt will benefit from continued skilled physical therapy services to decrease back and knee pain, improve hip strength, trunk and pelvic control, and knee flexion ROM, and improve ability to perform functional tasks.      PT Short Term Goals - 12/12/17 0930      PT SHORT TERM GOAL #1   Title  Patient will be independent with her HEP to help decrease back and L knee pain and improve ability to perform functional tasks.     Time  3    Period  Weeks    Status  Achieved    Target Date  12/12/17        PT Long Term Goals - 07/22/18 1433      PT LONG TERM GOAL #1   Title  Patient will have a decrease in back pain to 3/10 or less at worst to promote ability to ambulate, turn in bed, perform standing tasks with less pain.     Baseline  6/10 back pain at most (09/24/2017); 7-8/10  at most for the past 7 days, duration of pain is better since starting PT (10/29/2017); 5-6/10 at most for the past 7 days (11/18/2017); 8/10 at most for the past 7 days but the duration of pain is less compared to prior to starting PT (12/12/2017).  7/10 back pain at most for the past 7 days (pt states doing her exercises helps decrease her pain; 01/02/2018); walking (3/10), standing (4/10), bed mobility (0/10) 02/05/2018; 6/10 back pain at most for the past 7 days (03/03/2018); 6.5/10 back pain at most for the past 7 days (03/27/2018); 7-8/10 at most (05/08/2018); 7/10 back pain at worst (07/22/2018)    Time  6    Period  Weeks    Status  On-going    Target Date  09/04/18      PT LONG TERM GOAL #2   Title  Patient will have a decrease in L knee pain to 3/10 or less at worst to promote ability to ambulate, negotiate  stairs, get into and out of a car more comfortably.     Baseline  6/10 L knee pain at most for the past 3 months (09/24/2017); 7-8/10 at most for the past 7 days, duration of pain is better since starting PT (10/29/2017); 7/10 at most for the past 7 days (11/18/2017); 6/10 L knee pain at most for the past 7 days (12/12/2017); 5/10 L knee pain at most for the past 7 days (pt states doing her exercises helps decrease pain; 01/02/2018); L knee 3/10 (02/05/2018); 4/10 at most for the past 7 days (03/03/2018); 5.5/10 L knee at most for the past 7 days (03/27/2018); 6-7/10 L knee pain at most (05/08/2018); 7/10 L knee pain at worst for the past month (07/22/2018)    Time  6    Period  Weeks    Status  On-going    Target Date  09/04/18      PT LONG TERM GOAL #3   Title  Patient will improve TUG time to 12 seconds or less as a demonstration of improved functional mobility and balance.     Baseline  TUG no AD: 16.05 seconds on average (09/24/2017); 14.3 seconds on average (10/29/2017); 13 seconds average (11/21/2017); 16.67 seconds average (12/12/2017);  14.17 seconds average (01/02/2018); 11.8 sec sonds (02/05/2018)    Time  6    Period  Weeks    Status  Achieved      PT LONG TERM GOAL #4   Title  Patient will improve her back FOTO score by at least 10 points as a demonstration of improved funtion.      Baseline  Back FOTO: 33 (09/24/2017); 38 (10/29/2017); 36 (11/21/2017); 40 (12/12/2017); 42 (01/02/2018); 47 (03/03/2018)    Time  6    Period  Weeks    Status  Achieved      PT LONG TERM GOAL #5   Title  Patient will improve bilateral LE strength by at least 1/2 MMT grade to promote ability to perform standing tasks.     Time  6    Period  Weeks    Status  Achieved      PT LONG TERM GOAL #6   Title  Patient will improve her Modified Oswestry Low Back pain disablity questionnaire by at least 10% as a demonstration of improved function.     Baseline  48% (09/24/2017); 46% (10/29/2017), (11/21/2017); 56% (12/12/2017); 36%  (01/02/2018), (03/03/2018)    Time  6    Period  Weeks    Status  Achieved      PT LONG TERM GOAL #7   Title  Pt will report decreased difficulty stepping into and out of her bathtub as well as getting into and out of her truck to promote ability to get into and out of places.     Baseline  Pt reports difficulty stepping into and out of her bathtub as well as difficulty getting into and out of her truck (01/02/2018); Pt states the truck and bath tub and difficult and painful.  (02/05/2018); Improving per subjective reports (03/03/2018); decreased difficulty with truck tranfser after cues for technique and practice, decreasing difficulty with stepping into and out of the bath tub based on subjective reports (03/27/2018 and 04/01/2018); Decreasing difficulty stepping into a bathtub. No difficulty getting into and out of her truck (05/08/2018), (07/22/2018)    Time  6    Period  Weeks    Status  Partially Met    Target Date  09/04/18      PT LONG TERM GOAL #8   Title  Patient will improve her BERG balance test score to 46/56 or more as a demonstration of decreased fall risk     Baseline  42/56 (01/02/2018); 54/56 (02/05/2018)    Time  6    Period  Weeks    Status  Achieved      PT LONG TERM GOAL  #9   TITLE  Pt will report minimal to no dizziness related to vertigo to promote balance and decrease fall risk.     Baseline  Pt reports dizziness (related to vertigo) which seem to affect her balance (01/15/2018); Patient reports "a little bit every now and then" (02/05/2018)    Time  6    Period  Weeks    Status  Achieved            Plan - 07/28/18 1354    Clinical Impression Statement  Pt demonstrates increased L lateral lean compensation due to L glute med muscle weakness during L LE stance phase of gait. Worked on improving strength and gave S/L L hip abduction exercise as part of her HEP. Continued working on improving L knee flexion strength and AROM to promote ability to step over areas such as  stepping into her bath tub. No complain of L knee or back pain after session. Pt will benefit from continued skilled physical therapy services to decrease back and knee pain, improve hip strength, trunk and pelvic control, and knee flexion ROM, and improve ability to perform functional tasks.     Personal Factors and Comorbidities  Age;Past/Current Experience;Time since onset of injury/illness/exacerbation    Examination-Activity Limitations  Stairs    Stability/Clinical Decision Making  Stable/Uncomplicated    Rehab Potential  Fair    Clinical Impairments Affecting Rehab Potential  (-) chronicity of condition, multiple areas of pain, medical history, age; (+) motivated, husband support    PT Frequency  1x / week    PT Duration  6 weeks    PT Treatment/Interventions  Therapeutic activities;Therapeutic exercise;Balance training;Neuromuscular re-education;Patient/family education;Manual techniques;Dry needling;Aquatic Therapy;Electrical Stimulation;Iontophoresis 31m/ml Dexamethasone;Gait training;Canalith Repostioning;Vestibular    PT Next Visit Plan   hip and knee strengthening, core strengthening, patellar mobility, manual techniques, modalities PRN    Consulted and Agree with Plan of Care  Patient       Patient will benefit from skilled therapeutic intervention in order to improve the following deficits and impairments:  Pain, Improper body mechanics, Postural dysfunction, Dizziness, Decreased strength, Difficulty walking, Decreased balance  Visit Diagnosis: History of falling  Radiculopathy, lumbar region  Muscle weakness (generalized)  Chronic pain of left knee     Problem List Patient Active Problem List   Diagnosis Date Noted  . Obese 06/20/2016  . Status post right knee replacement 06/19/2016  . S/P left TKA 06/18/2016  . Spinal stenosis of lumbar region 09/14/2015    Joneen Boers PT, DPT   07/28/2018, 3:16 PM  [facility] [address], [place], [address] Phone: [phone]   Fax:  [phone]  Name: Destiny Shaw MRN: [id number] Date of Birth: 30-Apr-1950

## 2018-07-28 NOTE — Patient Instructions (Signed)
  MedbridgeAccess Code: [email or web address]    Sidelying Hip Abduction  8x3

## 2018-08-07 ENCOUNTER — Ambulatory Visit: Payer: Medicare Other

## 2018-08-11 ENCOUNTER — Telehealth: Payer: Self-pay

## 2018-08-11 NOTE — Telephone Encounter (Signed)
Called patient to offer her an earlier time for her 08/12/2018 follow up appointment. Pt said that she is afraid of the increase number of COVID cases and wants to cancel tomorrow's appointment (08/12/2018 at 5 pm). Will try to go to her next follow up session next week. Offered the option of telehealth but pt states that she does not know how to set up her computer to do so for physical therapy and declines to participate in telehealth. Reviewed some of her HEP. Pt was recommended to perform her exercises at a certain time every day or every other day to help continue progress. Pt verbalized understanding and picked 10 am to do her exercises.

## 2018-08-12 ENCOUNTER — Ambulatory Visit: Payer: Medicare Other

## 2018-08-20 ENCOUNTER — Ambulatory Visit: Payer: Medicare Other

## 2018-08-20 ENCOUNTER — Telehealth: Payer: Self-pay

## 2018-08-20 NOTE — Telephone Encounter (Signed)
No show. Called patient who said that she did not realize that she had a follow up PT appointment today. Pt was provided the rest of the schedule over the phone for the rest of June and July 2020. Pt verbalized writing the schedule down in her calendar near her computer.   Pt also mentioned that her R anterior foot is bothering her when she walks. E-mailed her HEP to her. Pt able to access her exercises. Reviewed standing gastroc stretch for R LE with pt performing them for 3x15 seconds to promote ankle DF AROM and promote posterior glide of talar head to decrease pressure to talocrural joint anteriorly during foot flat to push-off phase.   No R anterior ankle pain with gait reported by pt.   Had pt perform exercise to promote better gait mechanics and decrease compensation and therefore protect progress for L knee and low back pain.   Pt also verbalized that she performed her HEP last week which helped her have a better day.    Pt verbalized that she will be able to attend her next PT session on 08/26/2018 at 10 am.

## 2018-08-26 ENCOUNTER — Other Ambulatory Visit: Payer: Self-pay

## 2018-08-26 ENCOUNTER — Ambulatory Visit: Payer: Medicare Other

## 2018-08-26 DIAGNOSIS — M5416 Radiculopathy, lumbar region: Secondary | ICD-10-CM

## 2018-08-26 DIAGNOSIS — G8929 Other chronic pain: Secondary | ICD-10-CM

## 2018-08-26 DIAGNOSIS — Z9181 History of falling: Secondary | ICD-10-CM

## 2018-08-26 DIAGNOSIS — M25562 Pain in left knee: Secondary | ICD-10-CM

## 2018-08-26 DIAGNOSIS — M6281 Muscle weakness (generalized): Secondary | ICD-10-CM

## 2018-08-26 NOTE — Patient Instructions (Addendum)
  Massage the outside of your left thigh and under your left thigh    R gastroc stretch over table (to decrease low back extension) 15 seconds x 3  Decreased R foot pain with gait.   Reviewed and given as part of her HEP. Drawing provided (modified the wall gastroc stretch on [facility]).

## 2018-08-26 NOTE — Therapy (Signed)
[facility] [address], [place], [address] Phone: [phone]   Fax:  [phone]  Physical Therapy Treatment  Patient Details  Name: Destiny Shaw MRN: [id number] Date of Birth: 1950-05-25 Referring Provider (PT): Paralee Cancel, MD   Encounter Date: 08/26/2018  PT End of Session - 08/26/18 1000    Visit Number  44    Number of Visits  82    Date for PT Re-Evaluation  09/04/18    Authorization Type  7    Authorization Time Period  of 10 progress note Medicare    PT Start Time  1000    PT Stop Time  8675    PT Time Calculation (min)  47 min    Activity Tolerance  Patient tolerated treatment well    Behavior During Therapy  [facility] for tasks assessed/performed       Past Medical History:  Diagnosis Date  . ADD (attention deficit disorder)   . Anemia   . Anginal pain ([place])    pt states has occas chest pain relates to indigestion; pt uses rest to relieve   . Anxiety   . Arthritis   . Concussion   . Depression   . Diabetes mellitus without complication ([place])   . Dizziness   . Fall   . GERD (gastroesophageal reflux disease)   . Headache   . History of urinary tract infection   . Hyperlipidemia   . Hypertension   . Hypothyroidism   . IBS (irritable bowel syndrome)   . Imbalance   . Numbness    right leg   . Numbness in both hands    comes and goes   . Pneumonia    last episode approx 1 year ago   . Sleep apnea   . Wears glasses     Past Surgical History:  Procedure Laterality Date  . BREAST CYST ASPIRATION Left 1980's   neg  . BREAST CYST EXCISION Right 1980's   neg  . BREAST LUMPECTOMY Right   . BREAST SURGERY    . CARPAL TUNNEL RELEASE    . CESAREAN SECTION     times 2  . COLONOSCOPY WITH PROPOFOL N/A 05/11/2015   Procedure: COLONOSCOPY WITH PROPOFOL;  Surgeon: Manya Silvas, MD;  Location: [facility] ENDOSCOPY;  Service: Endoscopy;  Laterality: N/A;  . DE QUERVAIN'S RELEASE Right   .  ESOPHAGOGASTRODUODENOSCOPY (EGD) WITH PROPOFOL N/A 05/11/2015   Procedure: ESOPHAGOGASTRODUODENOSCOPY (EGD) WITH PROPOFOL;  Surgeon: Manya Silvas, MD;  Location: [facility] ENDOSCOPY;  Service: Endoscopy;  Laterality: N/A;  . EYE SURGERY     laser surgery bilat   . HERNIA REPAIR    . KNEE ARTHROSCOPY    . LUMBAR LAMINECTOMY/DECOMPRESSION MICRODISCECTOMY Bilateral 09/14/2015   Procedure: MICRO LUMBAR BILATERAL DECOMPRESSION L4 - L5;  Surgeon: Susa Day, MD;  Location: [facility] ORS;  Service: Orthopedics;  Laterality: Bilateral;  . REDUCTION MAMMAPLASTY Bilateral 1980  . RHINOPLASTY    . TONSILLECTOMY    . TOTAL KNEE ARTHROPLASTY Left 06/18/2016   Procedure: LEFT TOTAL KNEE ARTHROPLASTY;  Surgeon: Paralee Cancel, MD;  Location: [facility] ORS;  Service: Orthopedics;  Laterality: Left;  Adductor Block  . TUBAL LIGATION    . UVULOPALATOPHARYNGOPLASTY      There were no vitals filed for this visit.  Subjective Assessment - 08/26/18 1000    Subjective  Back hurts L knee hurts, and her R anterior  ankle hurts. Does not know why. Has been trying to  do her HEP. Walking and sitting bothers her back. Walking bothers her R foot. 8/10 back pain, 7/10 L knee pain currently.    Pertinent History  Low back pain. Pt states having back surgery about 2-3 years ago. After she had her L TKA, her back started aggravating her due to her walking. Pt also fell about 4 weeks ago onto her L knee. Dr. Alvan Dame checked out her L knee. L knee was fine but has some inflammation.  Pt tries to keep it iced. Pt still recovering for her L TKA but the fall set her back.   Her L knee surgery was last year.  L knee still bothers her a lot.  Had surgery for her back before her L knee surgery. Was doing well until her L knee surgery. The fall made it worse. Feels debilitating. Also has a hard time getting into and out of the car mainly due to her L knee.   Pt fell due to her L knee bucking on her. Pt was trying to pick something up from the side of the  couch.  Also has a hard time getting up from the floor.  Pt states having bladder accidents since after her fall (pt was recommended to tell her MD about it).  Bowel problems since taking medications (was constapated, but after taking medications, pt had diarrhea which was difficult to control. Getting out of the metformin fixed the bowel issue).  Pt states tingling and numbness L lateral LE along the L5 dermatome.  Denies saddle anesthesia.  Pt states that her back problem is mainly on her L side.  Pt landed on her L knee when she fell.  No other falls within the last 6 months. Pt also states being very dizzy since her L knee surgery.  Had PT treatement for her dizziness before which did not help.  The room feels like it is spinning when she gets dizzy.  I feel like it is getting better then she does something.     Patient Stated Goals  Be better able to get into and out of her vehicle (midsized truck), into and out of chairs, be better able to roll in her bed with less L knee pain. Be able to get down on the floor and get back up.     Currently in Pain?  Yes    Pain Onset  More than a month ago                               PT Education - 08/26/18 1041    Education Details  ther-ex, reviewed HEP    Person(s) Educated  Patient    Methods  Explanation;Demonstration;Tactile cues;Verbal cues;Handout    Comprehension  Returned demonstration;Verbalized understanding      Objectives   MedbridgeAccess Code: [email or web address]   Latex free bands used   Pt also adds that she wants to work on floor to stand transfers.  Sitting posture: L trunk rotation  Standing posture: slightLlateral shift  Pt states that pressing her back against the chair feels better.   Gait observation: increased L lateral lean during L LE stance phase.    Therapeutic exercise  Standing R gastroc stretch at wall 30 seconds. Low back discomfort.   R gastroc stretch while bent over  table (to decrease low back extension pressure) 25 seconds x 2  Decreased R anterior foot pain with gait.   Reviewed and given as  part of her HEP. Drawing provided.   SLS with light touch assist to promote glute med muscle strengthening.   R 10x2 with 5 seconds   L 10x2 with 5 seconds. R TFL muscle fatigue felt.   Supine R hip IR stretch 30 seconds x 5.   decreased R low back pain with gait   Seated R hip IR 10x3   Standing hip abduction with B UE assist   R 10x with 5 second holds and 8x5 seconds   L 10x with 5 second holds and 8x5 second holds    Improved exercise technique, movement at target joints, use of target muscles after mod verbal, visual, tactile cues.     Response to treatment No R anterior ankle pain and decreased low back and L knee pain with gait after session.     Clinical impression Worked on improving R ankle DF AROM to decrease anterior ankle pain with gait. Worked on glute med muscle strengthening as well as R hip IR to decrease stiffness and promote lumbopelvic control during gait. Decreased L knee and back pain and no R anterior ankle pain while ambulating after session. Pt demonstrates pelvic drop during stance phase of gait. Worked on glute strength secondary to symptom increase while walking based on pt subjective reports. Pt will benefit from continued skilled physical therapy services to improve strength, function, decrease back and L knee pain, improve balance.       PT Short Term Goals - 12/12/17 0930      PT SHORT TERM GOAL #1   Title  Patient will be independent with her HEP to help decrease back and L knee pain and improve ability to perform functional tasks.     Time  3    Period  Weeks    Status  Achieved    Target Date  12/12/17        PT Long Term Goals - 07/22/18 1433      PT LONG TERM GOAL #1   Title  Patient will have a decrease in back pain to 3/10 or less at worst to promote ability to ambulate, turn in bed, perform  standing tasks with less pain.     Baseline  6/10 back pain at most (09/24/2017); 7-8/10 at most for the past 7 days, duration of pain is better since starting PT (10/29/2017); 5-6/10 at most for the past 7 days (11/18/2017); 8/10 at most for the past 7 days but the duration of pain is less compared to prior to starting PT (12/12/2017).  7/10 back pain at most for the past 7 days (pt states doing her exercises helps decrease her pain; 01/02/2018); walking (3/10), standing (4/10), bed mobility (0/10) 02/05/2018; 6/10 back pain at most for the past 7 days (03/03/2018); 6.5/10 back pain at most for the past 7 days (03/27/2018); 7-8/10 at most (05/08/2018); 7/10 back pain at worst (07/22/2018)    Time  6    Period  Weeks    Status  On-going    Target Date  09/04/18      PT LONG TERM GOAL #2   Title  Patient will have a decrease in L knee pain to 3/10 or less at worst to promote ability to ambulate, negotiate stairs, get into and out of a car more comfortably.     Baseline  6/10 L knee pain at most for the past 3 months (09/24/2017); 7-8/10 at most for the past 7 days, duration of pain is better since starting PT (  10/29/2017); 7/10 at most for the past 7 days (11/18/2017); 6/10 L knee pain at most for the past 7 days (12/12/2017); 5/10 L knee pain at most for the past 7 days (pt states doing her exercises helps decrease pain; 01/02/2018); L knee 3/10 (02/05/2018); 4/10 at most for the past 7 days (03/03/2018); 5.5/10 L knee at most for the past 7 days (03/27/2018); 6-7/10 L knee pain at most (05/08/2018); 7/10 L knee pain at worst for the past month (07/22/2018)    Time  6    Period  Weeks    Status  On-going    Target Date  09/04/18      PT LONG TERM GOAL #3   Title  Patient will improve TUG time to 12 seconds or less as a demonstration of improved functional mobility and balance.     Baseline  TUG no AD: 16.05 seconds on average (09/24/2017); 14.3 seconds on average (10/29/2017); 13 seconds average (11/21/2017); 16.67 seconds  average (12/12/2017);  14.17 seconds average (01/02/2018); 11.8 sec sonds (02/05/2018)    Time  6    Period  Weeks    Status  Achieved      PT LONG TERM GOAL #4   Title  Patient will improve her back FOTO score by at least 10 points as a demonstration of improved funtion.      Baseline  Back FOTO: 33 (09/24/2017); 38 (10/29/2017); 36 (11/21/2017); 40 (12/12/2017); 42 (01/02/2018); 47 (03/03/2018)    Time  6    Period  Weeks    Status  Achieved      PT LONG TERM GOAL #5   Title  Patient will improve bilateral LE strength by at least 1/2 MMT grade to promote ability to perform standing tasks.     Time  6    Period  Weeks    Status  Achieved      PT LONG TERM GOAL #6   Title  Patient will improve her Modified Oswestry Low Back pain disablity questionnaire by at least 10% as a demonstration of improved function.     Baseline  48% (09/24/2017); 46% (10/29/2017), (11/21/2017); 56% (12/12/2017); 36% (01/02/2018), (03/03/2018)    Time  6    Period  Weeks    Status  Achieved      PT LONG TERM GOAL #7   Title  Pt will report decreased difficulty stepping into and out of her bathtub as well as getting into and out of her truck to promote ability to get into and out of places.     Baseline  Pt reports difficulty stepping into and out of her bathtub as well as difficulty getting into and out of her truck (01/02/2018); Pt states the truck and bath tub and difficult and painful.  (02/05/2018); Improving per subjective reports (03/03/2018); decreased difficulty with truck tranfser after cues for technique and practice, decreasing difficulty with stepping into and out of the bath tub based on subjective reports (03/27/2018 and 04/01/2018); Decreasing difficulty stepping into a bathtub. No difficulty getting into and out of her truck (05/08/2018), (07/22/2018)    Time  6    Period  Weeks    Status  Partially Met    Target Date  09/04/18      PT LONG TERM GOAL #8   Title  Patient will improve her BERG balance test score to  46/56 or more as a demonstration of decreased fall risk     Baseline  42/56 (01/02/2018); 54/56 (02/05/2018)  Time  6    Period  Weeks    Status  Achieved      PT LONG TERM GOAL  #9   TITLE  Pt will report minimal to no dizziness related to vertigo to promote balance and decrease fall risk.     Baseline  Pt reports dizziness (related to vertigo) which seem to affect her balance (01/15/2018); Patient reports "a little bit every now and then" (02/05/2018)    Time  6    Period  Weeks    Status  Achieved            Plan - 08/26/18 1000    Clinical Impression Statement  Worked on improving R ankle DF AROM to decrease anterior ankle pain with gait. Worked on glute med muscle strengthening as well as R hip IR to decrease stiffness and promote lumbopelvic control during gait. Decreased L knee and back pain and no R anterior ankle pain while ambulating after session. Pt demonstrates pelvic drop during stance phase of gait. Worked on glute strength secondary to symptom increase while walking based on pt subjective reports. Pt will benefit from continued skilled physical therapy services to improve strength, function, decrease back and L knee pain, improve balance.    Personal Factors and Comorbidities  Age;Past/Current Experience;Time since onset of injury/illness/exacerbation    Examination-Activity Limitations  Stairs    Stability/Clinical Decision Making  Stable/Uncomplicated    Rehab Potential  Fair    Clinical Impairments Affecting Rehab Potential  (-) chronicity of condition, multiple areas of pain, medical history, age; (+) motivated, husband support    PT Frequency  1x / week    PT Duration  6 weeks    PT Treatment/Interventions  Therapeutic activities;Therapeutic exercise;Balance training;Neuromuscular re-education;Patient/family education;Manual techniques;Dry needling;Aquatic Therapy;Electrical Stimulation;Iontophoresis 39m/ml Dexamethasone;Gait training;Canalith  Repostioning;Vestibular    PT Next Visit Plan   hip and knee strengthening, core strengthening, patellar mobility, manual techniques, modalities PRN    Consulted and Agree with Plan of Care  Patient       Patient will benefit from skilled therapeutic intervention in order to improve the following deficits and impairments:  Pain, Improper body mechanics, Postural dysfunction, Dizziness, Decreased strength, Difficulty walking, Decreased balance  Visit Diagnosis: 1. Radiculopathy, lumbar region   2. Muscle weakness (generalized)   3. Chronic pain of left knee   4. History of falling        Problem List Patient Active Problem List   Diagnosis Date Noted  . Obese 06/20/2016  . Status post right knee replacement 06/19/2016  . S/P left TKA 06/18/2016  . Spinal stenosis of lumbar region 09/14/2015    MJoneen BoersPT, DPT   08/26/2018, 11:01 AM  [facility] [address] [place] [id number]: [phone]  Fax:  [phone] Name: RJALAIYA OYSTERMRN: [id number] of Birth: 410-10-1950

## 2018-09-01 ENCOUNTER — Ambulatory Visit: Payer: Medicare Other

## 2018-09-08 ENCOUNTER — Ambulatory Visit: Payer: Medicare Other | Attending: Orthopedic Surgery

## 2018-09-08 ENCOUNTER — Other Ambulatory Visit: Payer: Self-pay

## 2018-09-08 DIAGNOSIS — G8929 Other chronic pain: Secondary | ICD-10-CM

## 2018-09-08 DIAGNOSIS — M5416 Radiculopathy, lumbar region: Secondary | ICD-10-CM | POA: Diagnosis not present

## 2018-09-08 DIAGNOSIS — M6281 Muscle weakness (generalized): Secondary | ICD-10-CM | POA: Insufficient documentation

## 2018-09-08 DIAGNOSIS — R42 Dizziness and giddiness: Secondary | ICD-10-CM | POA: Insufficient documentation

## 2018-09-08 DIAGNOSIS — M25562 Pain in left knee: Secondary | ICD-10-CM | POA: Insufficient documentation

## 2018-09-08 DIAGNOSIS — Z9181 History of falling: Secondary | ICD-10-CM | POA: Diagnosis present

## 2018-09-08 DIAGNOSIS — R41841 Cognitive communication deficit: Secondary | ICD-10-CM | POA: Diagnosis present

## 2018-09-08 NOTE — Therapy (Signed)
[facility] [address] [place], [address] Phone: [phone]   Fax:  [phone]  Physical Therapy Treatment  Patient Details  Name: Destiny Shaw MRN: [id number] Date of Birth: 05-Aug-1950 Referring Provider (PT): Paralee Cancel, MD   Encounter Date: 09/08/2018  PT End of Session - 09/08/18 1505    Visit Number  45    Number of Visits  10    Date for PT Re-Evaluation  09/04/18    Authorization Type  8    Authorization Time Period  of 10 progress note Medicare    PT Start Time  1457   Pt late for appointment, unclear why   PT Stop Time  1527    PT Time Calculation (min)  30 min    Activity Tolerance  Patient tolerated treatment well;No increased pain    Behavior During Therapy  WFL for tasks assessed/performed       Past Medical History:  Diagnosis Date  . ADD (attention deficit disorder)   . Anemia   . Anginal pain ([place])    pt states has occas chest pain relates to indigestion; pt uses rest to relieve   . Anxiety   . Arthritis   . Concussion   . Depression   . Diabetes mellitus without complication ([place])   . Dizziness   . Fall   . GERD (gastroesophageal reflux disease)   . Headache   . History of urinary tract infection   . Hyperlipidemia   . Hypertension   . Hypothyroidism   . IBS (irritable bowel syndrome)   . Imbalance   . Numbness    right leg   . Numbness in both hands    comes and goes   . Pneumonia    last episode approx 1 year ago   . Sleep apnea   . Wears glasses     Past Surgical History:  Procedure Laterality Date  . BREAST CYST ASPIRATION Left 1980's   neg  . BREAST CYST EXCISION Right 1980's   neg  . BREAST LUMPECTOMY Right   . BREAST SURGERY    . CARPAL TUNNEL RELEASE    . CESAREAN SECTION     times 2  . COLONOSCOPY WITH PROPOFOL N/A 05/11/2015   Procedure: COLONOSCOPY WITH PROPOFOL;  Surgeon: Manya Silvas, MD;  Location: [facility] ENDOSCOPY;  Service: Endoscopy;   Laterality: N/A;  . DE QUERVAIN'S RELEASE Right   . ESOPHAGOGASTRODUODENOSCOPY (EGD) WITH PROPOFOL N/A 05/11/2015   Procedure: ESOPHAGOGASTRODUODENOSCOPY (EGD) WITH PROPOFOL;  Surgeon: Manya Silvas, MD;  Location: [facility] ENDOSCOPY;  Service: Endoscopy;  Laterality: N/A;  . EYE SURGERY     laser surgery bilat   . HERNIA REPAIR    . KNEE ARTHROSCOPY    . LUMBAR LAMINECTOMY/DECOMPRESSION MICRODISCECTOMY Bilateral 09/14/2015   Procedure: MICRO LUMBAR BILATERAL DECOMPRESSION L4 - L5;  Surgeon: Susa Day, MD;  Location: [facility] ORS;  Service: Orthopedics;  Laterality: Bilateral;  . REDUCTION MAMMAPLASTY Bilateral 1980  . RHINOPLASTY    . TONSILLECTOMY    . TOTAL KNEE ARTHROPLASTY Left 06/18/2016   Procedure: LEFT TOTAL KNEE ARTHROPLASTY;  Surgeon: Paralee Cancel, MD;  Location: [facility] ORS;  Service: Orthopedics;  Laterality: Left;  Adductor Block  . TUBAL LIGATION    . UVULOPALATOPHARYNGOPLASTY      There were no vitals filed for this visit.  Subjective Assessment - 09/08/18 1501    Subjective  Reports her back has been in more spasm since last week,  premdominantly by end of day. She has been doing most of her HEP early in the morning. Her Left knee has also been stiff recentl too.    Pertinent History  Low back pain. Pt states having back surgery about 2-3 years ago. After she had her L TKA, her back started aggravating her due to her walking. Pt also fell about 4 weeks ago onto her L knee. Dr. Alvan Dame checked out her L knee. L knee was fine but has some inflammation.  Pt tries to keep it iced. Pt still recovering for her L TKA but the fall set her back.   Her L knee surgery was last year.  L knee still bothers her a lot.  Had surgery for her back before her L knee surgery. Was doing well until her L knee surgery. The fall made it worse. Feels debilitating. Also has a hard time getting into and out of the car mainly due to her L knee.   Pt fell due to her L knee bucking on her. Pt was trying to pick  something up from the side of the couch.  Also has a hard time getting up from the floor.  Pt states having bladder accidents since after her fall (pt was recommended to tell her MD about it).  Bowel problems since taking medications (was constapated, but after taking medications, pt had diarrhea which was difficult to control. Getting out of the metformin fixed the bowel issue).  Pt states tingling and numbness L lateral LE along the L5 dermatome.  Denies saddle anesthesia.  Pt states that her back problem is mainly on her L side.  Pt landed on her L knee when she fell.  No other falls within the last 6 months. Pt also states being very dizzy since her L knee surgery.  Had PT treatement for her dizziness before which did not help.  The room feels like it is spinning when she gets dizzy.  I feel like it is getting better then she does something.     Currently in Pain?  Yes    Pain Score  3     Pain Location  Back   Left lower back/posterior pelvis   Multiple Pain Sites  Yes    Pain Score  0   4/5 with walking, no pain at rest.   Pain Location  Knee    Pain Orientation  Left         Therapeutic exercise  -Bilat Calf Stretch on Giant Bumpy Blue Half Dome Dyna-disc 3x30sec bilat (subjective tightness on Left)  (no low back aggravation and no heel pain per patient), single UE support for balance  -Prone Hip IR/ER stretch: 2x30sec each bilat; -Prone Myofascial release- Left posterolateral glute medius, 5-6 points for  - SLS with light touch assist to promote glute med muscle strengthening.    PT Short Term Goals - 12/12/17 0930      PT SHORT TERM GOAL #1   Title  Patient will be independent with her HEP to help decrease back and L knee pain and improve ability to perform functional tasks.     Time  3    Period  Weeks    Status  Achieved    Target Date  12/12/17        PT Long Term Goals - 07/22/18 1433      PT LONG TERM GOAL #1   Title  Patient will have a decrease in back pain to  3/10 or less  at worst to promote ability to ambulate, turn in bed, perform standing tasks with less pain.     Baseline  6/10 back pain at most (09/24/2017); 7-8/10 at most for the past 7 days, duration of pain is better since starting PT (10/29/2017); 5-6/10 at most for the past 7 days (11/18/2017); 8/10 at most for the past 7 days but the duration of pain is less compared to prior to starting PT (12/12/2017).  7/10 back pain at most for the past 7 days (pt states doing her exercises helps decrease her pain; 01/02/2018); walking (3/10), standing (4/10), bed mobility (0/10) 02/05/2018; 6/10 back pain at most for the past 7 days (03/03/2018); 6.5/10 back pain at most for the past 7 days (03/27/2018); 7-8/10 at most (05/08/2018); 7/10 back pain at worst (07/22/2018)    Time  6    Period  Weeks    Status  On-going    Target Date  09/04/18      PT LONG TERM GOAL #2   Title  Patient will have a decrease in L knee pain to 3/10 or less at worst to promote ability to ambulate, negotiate stairs, get into and out of a car more comfortably.     Baseline  6/10 L knee pain at most for the past 3 months (09/24/2017); 7-8/10 at most for the past 7 days, duration of pain is better since starting PT (10/29/2017); 7/10 at most for the past 7 days (11/18/2017); 6/10 L knee pain at most for the past 7 days (12/12/2017); 5/10 L knee pain at most for the past 7 days (pt states doing her exercises helps decrease pain; 01/02/2018); L knee 3/10 (02/05/2018); 4/10 at most for the past 7 days (03/03/2018); 5.5/10 L knee at most for the past 7 days (03/27/2018); 6-7/10 L knee pain at most (05/08/2018); 7/10 L knee pain at worst for the past month (07/22/2018)    Time  6    Period  Weeks    Status  On-going    Target Date  09/04/18      PT LONG TERM GOAL #3   Title  Patient will improve TUG time to 12 seconds or less as a demonstration of improved functional mobility and balance.     Baseline  TUG no AD: 16.05 seconds on average (09/24/2017); 14.3  seconds on average (10/29/2017); 13 seconds average (11/21/2017); 16.67 seconds average (12/12/2017);  14.17 seconds average (01/02/2018); 11.8 sec sonds (02/05/2018)    Time  6    Period  Weeks    Status  Achieved      PT LONG TERM GOAL #4   Title  Patient will improve her back FOTO score by at least 10 points as a demonstration of improved funtion.      Baseline  Back FOTO: 33 (09/24/2017); 38 (10/29/2017); 36 (11/21/2017); 40 (12/12/2017); 42 (01/02/2018); 47 (03/03/2018)    Time  6    Period  Weeks    Status  Achieved      PT LONG TERM GOAL #5   Title  Patient will improve bilateral LE strength by at least 1/2 MMT grade to promote ability to perform standing tasks.     Time  6    Period  Weeks    Status  Achieved      PT LONG TERM GOAL #6   Title  Patient will improve her Modified Oswestry Low Back pain disablity questionnaire by at least 10% as a demonstration of improved function.  Baseline  48% (09/24/2017); 46% (10/29/2017), (11/21/2017); 56% (12/12/2017); 36% (01/02/2018), (03/03/2018)    Time  6    Period  Weeks    Status  Achieved      PT LONG TERM GOAL #7   Title  Pt will report decreased difficulty stepping into and out of her bathtub as well as getting into and out of her truck to promote ability to get into and out of places.     Baseline  Pt reports difficulty stepping into and out of her bathtub as well as difficulty getting into and out of her truck (01/02/2018); Pt states the truck and bath tub and difficult and painful.  (02/05/2018); Improving per subjective reports (03/03/2018); decreased difficulty with truck tranfser after cues for technique and practice, decreasing difficulty with stepping into and out of the bath tub based on subjective reports (03/27/2018 and 04/01/2018); Decreasing difficulty stepping into a bathtub. No difficulty getting into and out of her truck (05/08/2018), (07/22/2018)    Time  6    Period  Weeks    Status  Partially Met    Target Date  09/04/18      PT  LONG TERM GOAL #8   Title  Patient will improve her BERG balance test score to 46/56 or more as a demonstration of decreased fall risk     Baseline  42/56 (01/02/2018); 54/56 (02/05/2018)    Time  6    Period  Weeks    Status  Achieved      PT LONG TERM GOAL  #9   TITLE  Pt will report minimal to no dizziness related to vertigo to promote balance and decrease fall risk.     Baseline  Pt reports dizziness (related to vertigo) which seem to affect her balance (01/15/2018); Patient reports "a little bit every now and then" (02/05/2018)    Time  6    Period  Weeks    Status  Achieved            Plan - 09/08/18 1526    Clinical Impression Statement  Pt arrived late to appointment. continued with plan of care from prior session, emphasis on ankle mobility and gluteal spasm, tightness, and pain. Pt has decreased pain at end of session. Pt will continues to monitor symptoms after session to ascertain utility of MFR to gluteals.    Stability/Clinical Decision Making  Stable/Uncomplicated    Rehab Potential  Fair    PT Frequency  1x / week    PT Duration  6 weeks    PT Treatment/Interventions  Therapeutic activities;Therapeutic exercise;Balance training;Neuromuscular re-education;Patient/family education;Manual techniques;Dry needling;Aquatic Therapy;Electrical Stimulation;Iontophoresis 64m/ml Dexamethasone;Gait training;Canalith Repostioning;Vestibular    PT Next Visit Plan   hip and knee strengthening, core strengthening, patellar mobility, manual techniques, modalities PRN    Consulted and Agree with Plan of Care  Patient       Patient will benefit from skilled therapeutic intervention in order to improve the following deficits and impairments:  Pain, Improper body mechanics, Postural dysfunction, Dizziness, Decreased strength, Difficulty walking, Decreased balance  Visit Diagnosis: 1. Radiculopathy, lumbar region   2. Muscle weakness (generalized)   3. Chronic pain of left knee   4.  History of falling   5. Dizziness and giddiness   6. Cognitive communication deficit        Problem List Patient Active Problem List   Diagnosis Date Noted  . Obese 06/20/2016  . Status post right knee replacement 06/19/2016  . S/P left TKA 06/18/2016  .  Spinal stenosis of lumbar region 09/14/2015   3:31 PM, 09/08/18 Etta Grandchild, PT, DPT Physical Therapist - [place] [phone] (Office)    Mariah Harn C 09/08/2018, 3:31 PM  [facility] [address] [place], [address] Phone: [phone]   Fax:  [phone]  Name: MEEGAN SHANAFELT MRN: [id number] Date of Birth: 11-Jun-1950

## 2018-09-15 ENCOUNTER — Ambulatory Visit: Payer: Medicare Other

## 2018-09-22 ENCOUNTER — Other Ambulatory Visit: Payer: Self-pay

## 2018-09-22 ENCOUNTER — Ambulatory Visit: Payer: Medicare Other

## 2018-09-22 DIAGNOSIS — M6281 Muscle weakness (generalized): Secondary | ICD-10-CM

## 2018-09-22 DIAGNOSIS — R42 Dizziness and giddiness: Secondary | ICD-10-CM

## 2018-09-22 DIAGNOSIS — M5416 Radiculopathy, lumbar region: Secondary | ICD-10-CM | POA: Diagnosis not present

## 2018-09-22 DIAGNOSIS — G8929 Other chronic pain: Secondary | ICD-10-CM

## 2018-09-22 DIAGNOSIS — Z9181 History of falling: Secondary | ICD-10-CM

## 2018-09-22 NOTE — Therapy (Signed)
[facility] [address] [place], [address] Phone: [phone]   Fax:  [phone]  Physical Therapy Treatment And Progress Report (07/22/2018 - 09/22/2018)  Patient Details  Name: Destiny Shaw MRN: [id number] Date of Birth: Aug 26, 1950 Referring Provider (PT): Paralee Cancel, MD   Encounter Date: 09/22/2018  PT End of Session - 09/22/18 1447    Visit Number  46    Number of Visits  54    Date for PT Re-Evaluation  11/06/18    Authorization Type  9    Authorization Time Period  of 10 progress note Medicare    PT Start Time  1448    PT Stop Time  1534    PT Time Calculation (min)  46 min    Activity Tolerance  Patient tolerated treatment well;No increased pain    Behavior During Therapy  WFL for tasks assessed/performed       Past Medical History:  Diagnosis Date  . ADD (attention deficit disorder)   . Anemia   . Anginal pain ([place])    pt states has occas chest pain relates to indigestion; pt uses rest to relieve   . Anxiety   . Arthritis   . Concussion   . Depression   . Diabetes mellitus without complication ([place])   . Dizziness   . Fall   . GERD (gastroesophageal reflux disease)   . Headache   . History of urinary tract infection   . Hyperlipidemia   . Hypertension   . Hypothyroidism   . IBS (irritable bowel syndrome)   . Imbalance   . Numbness    right leg   . Numbness in both hands    comes and goes   . Pneumonia    last episode approx 1 year ago   . Sleep apnea   . Wears glasses     Past Surgical History:  Procedure Laterality Date  . BREAST CYST ASPIRATION Left 1980's   neg  . BREAST CYST EXCISION Right 1980's   neg  . BREAST LUMPECTOMY Right   . BREAST SURGERY    . CARPAL TUNNEL RELEASE    . CESAREAN SECTION     times 2  . COLONOSCOPY WITH PROPOFOL N/A 05/11/2015   Procedure: COLONOSCOPY WITH PROPOFOL;  Surgeon: Manya Silvas, MD;  Location: [facility] ENDOSCOPY;  Service:  Endoscopy;  Laterality: N/A;  . DE QUERVAIN'S RELEASE Right   . ESOPHAGOGASTRODUODENOSCOPY (EGD) WITH PROPOFOL N/A 05/11/2015   Procedure: ESOPHAGOGASTRODUODENOSCOPY (EGD) WITH PROPOFOL;  Surgeon: Manya Silvas, MD;  Location: [facility] ENDOSCOPY;  Service: Endoscopy;  Laterality: N/A;  . EYE SURGERY     laser surgery bilat   . HERNIA REPAIR    . KNEE ARTHROSCOPY    . LUMBAR LAMINECTOMY/DECOMPRESSION MICRODISCECTOMY Bilateral 09/14/2015   Procedure: MICRO LUMBAR BILATERAL DECOMPRESSION L4 - L5;  Surgeon: Susa Day, MD;  Location: [facility] ORS;  Service: Orthopedics;  Laterality: Bilateral;  . REDUCTION MAMMAPLASTY Bilateral 1980  . RHINOPLASTY    . TONSILLECTOMY    . TOTAL KNEE ARTHROPLASTY Left 06/18/2016   Procedure: LEFT TOTAL KNEE ARTHROPLASTY;  Surgeon: Paralee Cancel, MD;  Location: [facility] ORS;  Service: Orthopedics;  Laterality: Left;  Adductor Block  . TUBAL LIGATION    . UVULOPALATOPHARYNGOPLASTY      There were no vitals filed for this visit.  Subjective Assessment - 09/22/18 1449    Subjective  Back is doing pretty good, no pain currently, 7-8/10 at most for  the past 7 days such as when doing dishes. Can get rid of it when she lays down.  L knee is ok currently. L later thigh aches. Massaging her L lateral knee helps. 6/10 L knee at most for the past 7 days. Can't step into and out of her bathtub.Has been doing her HEP which makes her pain feel batter some. Has difficulty stepping over her cat or standing and donning and doffing her pants.Feels like she gets more L knee bending with PT. Wants to continue with the once a week sessions.    Pertinent History  Low back pain. Pt states having back surgery about 2-3 years ago. After she had her L TKA, her back started aggravating her due to her walking. Pt also fell about 4 weeks ago onto her L knee. Dr. Alvan Dame checked out her L knee. L knee was fine but has some inflammation.  Pt tries to keep it iced. Pt still recovering for her L TKA but the fall set  her back.   Her L knee surgery was last year.  L knee still bothers her a lot.  Had surgery for her back before her L knee surgery. Was doing well until her L knee surgery. The fall made it worse. Feels debilitating. Also has a hard time getting into and out of the car mainly due to her L knee.   Pt fell due to her L knee bucking on her. Pt was trying to pick something up from the side of the couch.  Also has a hard time getting up from the floor.  Pt states having bladder accidents since after her fall (pt was recommended to tell her MD about it).  Bowel problems since taking medications (was constapated, but after taking medications, pt had diarrhea which was difficult to control. Getting out of the metformin fixed the bowel issue).  Pt states tingling and numbness L lateral LE along the L5 dermatome.  Denies saddle anesthesia.  Pt states that her back problem is mainly on her L side.  Pt landed on her L knee when she fell.  No other falls within the last 6 months. Pt also states being very dizzy since her L knee surgery.  Had PT treatement for her dizziness before which did not help.  The room feels like it is spinning when she gets dizzy.  I feel like it is getting better then she does something.     Currently in Pain?  No/denies                               PT Education - 09/22/18 1749    Education Details  ther-ex, plan of care    Person(s) Educated  Patient    Methods  Explanation;Demonstration;Tactile cues;Verbal cues    Comprehension  Returned demonstration;Verbalized understanding      Objectives    MedbridgeAccess Code: [email or web address]   Latex free bands used   Pt also adds that she wants to work on floor to stand transfers.  Sitting posture: L trunk rotation  Standing posture: slightLlateral shift  Pt states that pressing her back against the chair feels better.   Gait observation: increased L lateral lean during L LE stance phase.       Therapeutic exercise   Pt demonstrates limited L hip and knee flexion AROM compared to R   (-) long sit test  Supine manual inferior glide L hip to decrease L  anterior hip impingement and increase hip flexion    supine manually resisted L LE leg press 10x3.   Improved comfort with L hip flexion and improve L hip flexion range. Improved supiune L knee flexion AROM as well   No L lateral thigh ache in sitting afterwards   Sit <> stand from low mat table (18 inches) with R foot on 4 inch step to emphasize use of L LE, especially glute max 10x2  Standing L lateral shift correction 5x5 seconds for 2 sets  Seated L knee flexion heel slides to promote L knee flexion ROM and ability to step over obstacles 5 seconds x 5  Reviewed and given as part of her HEP 10x3 with 5 second holds 3 times a day.   Reviewed plan of care: continue 1x/week for 6 weeks  Improved exercise technique, movement at target joints, use of target muscles after mod verbal, visual, tactile cues.         Response to treatment No L lateral thigh pain aferwards  Clinical impression  Pt demonstrates overall improved function. Slight decrease in L knee pain and similar level of back pain since last progress report. Still demonstrates difficulty with stepping over obstacles such as getting into and out of her bath tub secondary to limited L hip and knee flexion. Demonstrates decreased L hip flexion AROM with anterior impingement which may play a factor with L foot clearance when stepping over obstacles such as a cat or bath tub. Slight improved L hip flexion and no L lateral thigh pain with inferior glide to L hip joint and L glute strengthening. Challenges to progress include chronicity of condition, multiple areas of pain, and multiple problem areas. Pt will benefit from continued skilled physical therapy services to decrease pain, improve strength, function, and obstacle negotiation.      PT Short Term  Goals - 12/12/17 0930      PT SHORT TERM GOAL #1   Title  Patient will be independent with her HEP to help decrease back and L knee pain and improve ability to perform functional tasks.     Time  3    Period  Weeks    Status  Achieved    Target Date  12/12/17        PT Long Term Goals - 09/22/18 1751      PT LONG TERM GOAL #1   Title  Patient will have a decrease in back pain to 3/10 or less at worst to promote ability to ambulate, turn in bed, perform standing tasks with less pain.     Baseline  6/10 back pain at most (09/24/2017); 7-8/10 at most for the past 7 days, duration of pain is better since starting PT (10/29/2017); 5-6/10 at most for the past 7 days (11/18/2017); 8/10 at most for the past 7 days but the duration of pain is less compared to prior to starting PT (12/12/2017).  7/10 back pain at most for the past 7 days (pt states doing her exercises helps decrease her pain; 01/02/2018); walking (3/10), standing (4/10), bed mobility (0/10) 02/05/2018; 6/10 back pain at most for the past 7 days (03/03/2018); 6.5/10 back pain at most for the past 7 days (03/27/2018); 7-8/10 at most (05/08/2018); 7/10 back pain at worst (07/22/2018);  7-8/10 at most for the past 7 days (09/22/2018)    Time  6    Period  Weeks    Status  On-going    Target Date  11/06/18  PT LONG TERM GOAL #2   Title  Patient will have a decrease in L knee pain to 3/10 or less at worst to promote ability to ambulate, negotiate stairs, get into and out of a car more comfortably.     Baseline  6/10 L knee pain at most for the past 3 months (09/24/2017); 7-8/10 at most for the past 7 days, duration of pain is better since starting PT (10/29/2017); 7/10 at most for the past 7 days (11/18/2017); 6/10 L knee pain at most for the past 7 days (12/12/2017); 5/10 L knee pain at most for the past 7 days (pt states doing her exercises helps decrease pain; 01/02/2018); L knee 3/10 (02/05/2018); 4/10 at most for the past 7 days (03/03/2018); 5.5/10  L knee at most for the past 7 days (03/27/2018); 6-7/10 L knee pain at most (05/08/2018); 7/10 L knee pain at worst for the past month (07/22/2018);  6/10 L knee pain at most for the past 7 days (09/22/2018)    Time  6    Period  Weeks    Status  On-going    Target Date  11/06/18      PT LONG TERM GOAL #3   Title  Patient will improve TUG time to 12 seconds or less as a demonstration of improved functional mobility and balance.     Baseline  TUG no AD: 16.05 seconds on average (09/24/2017); 14.3 seconds on average (10/29/2017); 13 seconds average (11/21/2017); 16.67 seconds average (12/12/2017);  14.17 seconds average (01/02/2018); 11.8 sec sonds (02/05/2018)    Time  6    Period  Weeks    Status  Achieved      PT LONG TERM GOAL #4   Title  Patient will improve her back FOTO score by at least 10 points as a demonstration of improved funtion.      Baseline  Back FOTO: 33 (09/24/2017); 38 (10/29/2017); 36 (11/21/2017); 40 (12/12/2017); 42 (01/02/2018); 47 (03/03/2018)    Time  6    Period  Weeks    Status  Achieved      PT LONG TERM GOAL #5   Title  Patient will improve bilateral LE strength by at least 1/2 MMT grade to promote ability to perform standing tasks.     Time  6    Period  Weeks    Status  Achieved      PT LONG TERM GOAL #6   Title  Patient will improve her Modified Oswestry Low Back pain disablity questionnaire by at least 10% as a demonstration of improved function.     Baseline  48% (09/24/2017); 46% (10/29/2017), (11/21/2017); 56% (12/12/2017); 36% (01/02/2018), (03/03/2018)    Time  6    Period  Weeks    Status  Achieved      PT LONG TERM GOAL #7   Title  Pt will report decreased difficulty stepping into and out of her bathtub as well as getting into and out of her truck to promote ability to get into and out of places.     Baseline  Pt reports difficulty stepping into and out of her bathtub as well as difficulty getting into and out of her truck (01/02/2018); Pt states the truck and bath  tub and difficult and painful.  (02/05/2018); Improving per subjective reports (03/03/2018); decreased difficulty with truck tranfser after cues for technique and practice, decreasing difficulty with stepping into and out of the bath tub based on subjective reports (03/27/2018 and 04/01/2018); Decreasing difficulty  stepping into a bathtub. No difficulty getting into and out of her truck (05/08/2018), (07/22/2018); Difficulty stepping into and out of the bath tub. No difficulty with truck transfers (09/22/2018)    Time  6    Period  Weeks    Status  Partially Met    Target Date  11/06/18      PT LONG TERM GOAL #8   Title  Patient will improve her BERG balance test score to 46/56 or more as a demonstration of decreased fall risk     Baseline  42/56 (01/02/2018); 54/56 (02/05/2018)    Time  6    Period  Weeks    Status  Achieved      PT LONG TERM GOAL  #9   TITLE  Pt will report minimal to no dizziness related to vertigo to promote balance and decrease fall risk.     Baseline  Pt reports dizziness (related to vertigo) which seem to affect her balance (01/15/2018); Patient reports "a little bit every now and then" (02/05/2018)    Time  6    Period  Weeks    Status  Achieved            Plan - 09/22/18 1446    Clinical Impression Statement  Pt demonstrates overall improved function. Slight decrease in L knee pain and similar level of back pain since last progress report. Still demonstrates difficulty with stepping over obstacles such as getting into and out of her bath tub secondary to limited L hip and knee flexion. Demonstrates decreased L hip flexion AROM with anterior impingement which may play a factor with L foot clearance when stepping over obstacles such as a cat or bath tub. Slight improved L hip flexion and no L lateral thigh pain with inferior glide to L hip joint and L glute strengthening. Challenges to progress include chronicity of condition, multiple areas of pain, and multiple problem  areas. Pt will benefit from continued skilled physical therapy services to decrease pain, improve strength, function, and obstacle negotiation.    Personal Factors and Comorbidities  Age;Comorbidity 3+;Time since onset of injury/illness/exacerbation;Fitness;Past/Current Experience    Comorbidities  Back, knee pain, hx of dizziness, hx of back surgery, hx of C-section    Examination-Activity Limitations  Squat    Stability/Clinical Decision Making  Stable/Uncomplicated    Clinical Decision Making  Low    Clinical Presentation due to:  simlar back pain levels, good response to sessions    Rehab Potential  Fair    Clinical Impairments Affecting Rehab Potential  difficulty with carry-over of improvements in clinic, chronicity of condition, age, multiple problem areas    PT Frequency  1x / week    PT Duration  6 weeks    PT Treatment/Interventions  Therapeutic activities;Therapeutic exercise;Balance training;Neuromuscular re-education;Patient/family education;Manual techniques;Dry needling;Aquatic Therapy;Electrical Stimulation;Iontophoresis 58m/ml Dexamethasone;Gait training;Canalith Repostioning;Vestibular    PT Next Visit Plan   hip and knee strengthening, core strengthening, patellar mobility, manual techniques, modalities PRN    Consulted and Agree with Plan of Care  Patient       Patient will benefit from skilled therapeutic intervention in order to improve the following deficits and impairments:  Pain, Improper body mechanics, Postural dysfunction, Dizziness, Decreased strength, Difficulty walking, Decreased balance  Visit Diagnosis: 1. Radiculopathy, lumbar region   2. Muscle weakness (generalized)   3. Chronic pain of left knee   4. History of falling   5. Dizziness and giddiness        Problem List Patient  Active Problem List   Diagnosis Date Noted  . Obese 06/20/2016  . Status post right knee replacement 06/19/2016  . S/P left TKA 06/18/2016  . Spinal stenosis of lumbar  region 09/14/2015    Thank you for your referral.  Joneen Boers PT, DPT   09/22/2018, 6:13 PM  [facility] [address] [place], [address] Phone: [phone]   Fax:  [phone]  Name: Destiny Shaw MRN: [id number] Date of Birth: [birth year]

## 2018-09-29 ENCOUNTER — Ambulatory Visit: Payer: Medicare Other

## 2018-10-06 ENCOUNTER — Ambulatory Visit: Payer: Medicare Other | Attending: Orthopedic Surgery

## 2018-10-06 ENCOUNTER — Other Ambulatory Visit: Payer: Self-pay

## 2018-10-06 DIAGNOSIS — G8929 Other chronic pain: Secondary | ICD-10-CM | POA: Insufficient documentation

## 2018-10-06 DIAGNOSIS — M5416 Radiculopathy, lumbar region: Secondary | ICD-10-CM | POA: Insufficient documentation

## 2018-10-06 DIAGNOSIS — M6281 Muscle weakness (generalized): Secondary | ICD-10-CM | POA: Diagnosis present

## 2018-10-06 DIAGNOSIS — Z9181 History of falling: Secondary | ICD-10-CM | POA: Diagnosis present

## 2018-10-06 DIAGNOSIS — M25562 Pain in left knee: Secondary | ICD-10-CM | POA: Insufficient documentation

## 2018-10-06 NOTE — Patient Instructions (Signed)
  MedbridgeAccess Code: [email or web address]   Standing Partial Lunge  10x2

## 2018-10-06 NOTE — Therapy (Signed)
[facility] [address], [place], [address] Phone: [phone]   Fax:  [phone]  Physical Therapy Treatment  Patient Details  Name: Destiny Shaw MRN: [id number] Date of Birth: [birth year] No data recorded  Encounter Date: 10/06/2018  PT End of Session - 10/06/18 1436    Visit Number  50    Number of Visits  61    Date for PT Re-Evaluation  11/06/18    Authorization Type  1    Authorization Time Period  of 10 progress note Medicare    PT Start Time  1436    PT Stop Time  1520    PT Time Calculation (min)  44 min    Activity Tolerance  Patient tolerated treatment well;No increased pain    Behavior During Therapy  WFL for tasks assessed/performed       Past Medical History:  Diagnosis Date  . ADD (attention deficit disorder)   . Anemia   . Anginal pain ([place])    pt states has occas chest pain relates to indigestion; pt uses rest to relieve   . Anxiety   . Arthritis   . Concussion   . Depression   . Diabetes mellitus without complication ([place])   . Dizziness   . Fall   . GERD (gastroesophageal reflux disease)   . Headache   . History of urinary tract infection   . Hyperlipidemia   . Hypertension   . Hypothyroidism   . IBS (irritable bowel syndrome)   . Imbalance   . Numbness    right leg   . Numbness in both hands    comes and goes   . Pneumonia    last episode approx 1 year ago   . Sleep apnea   . Wears glasses     Past Surgical History:  Procedure Laterality Date  . BREAST CYST ASPIRATION Left 1980's   neg  . BREAST CYST EXCISION Right 1980's   neg  . BREAST LUMPECTOMY Right   . BREAST SURGERY    . CARPAL TUNNEL RELEASE    . CESAREAN SECTION     times 2  . COLONOSCOPY WITH PROPOFOL N/A 05/11/2015   Procedure: COLONOSCOPY WITH PROPOFOL;  Surgeon: Manya Silvas, MD;  Location: [facility] ENDOSCOPY;  Service: Endoscopy;  Laterality: N/A;  . DE QUERVAIN'S RELEASE Right   .  ESOPHAGOGASTRODUODENOSCOPY (EGD) WITH PROPOFOL N/A 05/11/2015   Procedure: ESOPHAGOGASTRODUODENOSCOPY (EGD) WITH PROPOFOL;  Surgeon: Manya Silvas, MD;  Location: [facility] ENDOSCOPY;  Service: Endoscopy;  Laterality: N/A;  . EYE SURGERY     laser surgery bilat   . HERNIA REPAIR    . KNEE ARTHROSCOPY    . LUMBAR LAMINECTOMY/DECOMPRESSION MICRODISCECTOMY Bilateral 09/14/2015   Procedure: MICRO LUMBAR BILATERAL DECOMPRESSION L4 - L5;  Surgeon: Susa Day, MD;  Location: [facility] ORS;  Service: Orthopedics;  Laterality: Bilateral;  . REDUCTION MAMMAPLASTY Bilateral 1980  . RHINOPLASTY    . TONSILLECTOMY    . TOTAL KNEE ARTHROPLASTY Left 06/18/2016   Procedure: LEFT TOTAL KNEE ARTHROPLASTY;  Surgeon: Paralee Cancel, MD;  Location: [facility] ORS;  Service: Orthopedics;  Laterality: Left;  Adductor Block  . TUBAL LIGATION    . UVULOPALATOPHARYNGOPLASTY      There were no vitals filed for this visit.  Subjective Assessment - 10/06/18 1438    Subjective  Back is giving her some problems. L knee is just mediocre. Back feels like it has some spasms.  Has been doing  her exercises for her back. Eases the pain off.  7/10 back pain (back spasm), 6/10 L knee when walking currently. Wants to get into the bath tub, sit down, and get back up out of it.    Pertinent History  Low back pain. Pt states having back surgery about 2-3 years ago. After she had her L TKA, her back started aggravating her due to her walking. Pt also fell about 4 weeks ago onto her L knee. Dr. Alvan Dame checked out her L knee. L knee was fine but has some inflammation.  Pt tries to keep it iced. Pt still recovering for her L TKA but the fall set her back.   Her L knee surgery was last year.  L knee still bothers her a lot.  Had surgery for her back before her L knee surgery. Was doing well until her L knee surgery. The fall made it worse. Feels debilitating. Also has a hard time getting into and out of the car mainly due to her L knee.   Pt fell due to her L  knee bucking on her. Pt was trying to pick something up from the side of the couch.  Also has a hard time getting up from the floor.  Pt states having bladder accidents since after her fall (pt was recommended to tell her MD about it).  Bowel problems since taking medications (was constapated, but after taking medications, pt had diarrhea which was difficult to control. Getting out of the metformin fixed the bowel issue).  Pt states tingling and numbness L lateral LE along the L5 dermatome.  Denies saddle anesthesia.  Pt states that her back problem is mainly on her L side.  Pt landed on her L knee when she fell.  No other falls within the last 6 months. Pt also states being very dizzy since her L knee surgery.  Had PT treatement for her dizziness before which did not help.  The room feels like it is spinning when she gets dizzy.  I feel like it is getting better then she does something.     Currently in Pain?  Yes    Pain Score  7                                PT Education - 10/06/18 1512    Education Details  ther-ex, HEP    Person(s) Educated  Patient    Methods  Explanation;Demonstration;Tactile cues;Verbal cues;Handout    Comprehension  Returned demonstration;Verbalized understanding          Objectives    MedbridgeAccess Code: [email or web address]   Latex free bands used   Pt also adds that she wants to work on floor to stand transfers.  Sitting posture: L trunk rotation  Standing posture: slightLlateral shift  Pt states that pressing her back against the chair feels better.   Gait observation: increased L lateral lean during L LE stance phase.      Therapeutic exercise   Stand to floor transfer using floor mat to simulate sitting in bath tub. B UE assist from chair and low mat table and CGA to min A from PT.   "Genuflecting" R knee onto Air Ex pad with B UE assist then standing up 5x   Then "genuflecting" R knee onto Air  Ex pad, sitting on Air Ex pad and tucking R foot under to get back up with B UE assist, min A  from PT 3x    L LE "running man" with R UE assist to promote L glute and quad strengthening CGA  10x, then 8x. Difficult.   SLS L LE with Min A from PT 10x5 seconds to promote glute med strengthening  SLS L LE with B UE assist 10x5 seconds to promote glute med strengthening   Standing Partial Lunge L LE 10x2  Reviewed and given as part of her HEP. Pt demonstrated and verbalized understanding. Handout provided.    Improved exercise technique, movement at target joints, use of target muscles after mod verbal, visual, tactile cues.    Response to treatment No L knee and decreased low back pain after session.   Clinical impression Worked on stand <> floor transfer per pt request to promote ability to get into and out of her bath tub. Able to perform with B UE assist and Min A from PT. Contiued working on L glute med, max, and quadriceps strengthening to help decrease L knee pain, back pain, and promote strength with the aforementioned transfer. Decreased back pain and no L knee pain after session. Pt will benefit from continued skilled physical therapy services to decrease pain, improve strength, and function.       PT Short Term Goals - 12/12/17 0930      PT SHORT TERM GOAL #1   Title  Patient will be independent with her HEP to help decrease back and L knee pain and improve ability to perform functional tasks.     Time  3    Period  Weeks    Status  Achieved    Target Date  12/12/17        PT Long Term Goals - 09/22/18 1751      PT LONG TERM GOAL #1   Title  Patient will have a decrease in back pain to 3/10 or less at worst to promote ability to ambulate, turn in bed, perform standing tasks with less pain.     Baseline  6/10 back pain at most (09/24/2017); 7-8/10 at most for the past 7 days, duration of pain is better since starting PT (10/29/2017); 5-6/10 at most for the past 7  days (11/18/2017); 8/10 at most for the past 7 days but the duration of pain is less compared to prior to starting PT (12/12/2017).  7/10 back pain at most for the past 7 days (pt states doing her exercises helps decrease her pain; 01/02/2018); walking (3/10), standing (4/10), bed mobility (0/10) 02/05/2018; 6/10 back pain at most for the past 7 days (03/03/2018); 6.5/10 back pain at most for the past 7 days (03/27/2018); 7-8/10 at most (05/08/2018); 7/10 back pain at worst (07/22/2018);  7-8/10 at most for the past 7 days (09/22/2018)    Time  6    Period  Weeks    Status  On-going    Target Date  11/06/18      PT LONG TERM GOAL #2   Title  Patient will have a decrease in L knee pain to 3/10 or less at worst to promote ability to ambulate, negotiate stairs, get into and out of a car more comfortably.     Baseline  6/10 L knee pain at most for the past 3 months (09/24/2017); 7-8/10 at most for the past 7 days, duration of pain is better since starting PT (10/29/2017); 7/10 at most for the past 7 days (11/18/2017); 6/10 L knee pain at most for the past 7 days (12/12/2017); 5/10 L knee pain  at most for the past 7 days (pt states doing her exercises helps decrease pain; 01/02/2018); L knee 3/10 (02/05/2018); 4/10 at most for the past 7 days (03/03/2018); 5.5/10 L knee at most for the past 7 days (03/27/2018); 6-7/10 L knee pain at most (05/08/2018); 7/10 L knee pain at worst for the past month (07/22/2018);  6/10 L knee pain at most for the past 7 days (09/22/2018)    Time  6    Period  Weeks    Status  On-going    Target Date  11/06/18      PT LONG TERM GOAL #3   Title  Patient will improve TUG time to 12 seconds or less as a demonstration of improved functional mobility and balance.     Baseline  TUG no AD: 16.05 seconds on average (09/24/2017); 14.3 seconds on average (10/29/2017); 13 seconds average (11/21/2017); 16.67 seconds average (12/12/2017);  14.17 seconds average (01/02/2018); 11.8 sec sonds (02/05/2018)    Time   6    Period  Weeks    Status  Achieved      PT LONG TERM GOAL #4   Title  Patient will improve her back FOTO score by at least 10 points as a demonstration of improved funtion.      Baseline  Back FOTO: 33 (09/24/2017); 38 (10/29/2017); 36 (11/21/2017); 40 (12/12/2017); 42 (01/02/2018); 47 (03/03/2018)    Time  6    Period  Weeks    Status  Achieved      PT LONG TERM GOAL #5   Title  Patient will improve bilateral LE strength by at least 1/2 MMT grade to promote ability to perform standing tasks.     Time  6    Period  Weeks    Status  Achieved      PT LONG TERM GOAL #6   Title  Patient will improve her Modified Oswestry Low Back pain disablity questionnaire by at least 10% as a demonstration of improved function.     Baseline  48% (09/24/2017); 46% (10/29/2017), (11/21/2017); 56% (12/12/2017); 36% (01/02/2018), (03/03/2018)    Time  6    Period  Weeks    Status  Achieved      PT LONG TERM GOAL #7   Title  Pt will report decreased difficulty stepping into and out of her bathtub as well as getting into and out of her truck to promote ability to get into and out of places.     Baseline  Pt reports difficulty stepping into and out of her bathtub as well as difficulty getting into and out of her truck (01/02/2018); Pt states the truck and bath tub and difficult and painful.  (02/05/2018); Improving per subjective reports (03/03/2018); decreased difficulty with truck tranfser after cues for technique and practice, decreasing difficulty with stepping into and out of the bath tub based on subjective reports (03/27/2018 and 04/01/2018); Decreasing difficulty stepping into a bathtub. No difficulty getting into and out of her truck (05/08/2018), (07/22/2018); Difficulty stepping into and out of the bath tub. No difficulty with truck transfers (09/22/2018)    Time  6    Period  Weeks    Status  Partially Met    Target Date  11/06/18      PT LONG TERM GOAL #8   Title  Patient will improve her BERG balance test score  to 46/56 or more as a demonstration of decreased fall risk     Baseline  42/56 (01/02/2018); 54/56 (02/05/2018)  Time  6    Period  Weeks    Status  Achieved      PT LONG TERM GOAL  #9   TITLE  Pt will report minimal to no dizziness related to vertigo to promote balance and decrease fall risk.     Baseline  Pt reports dizziness (related to vertigo) which seem to affect her balance (01/15/2018); Patient reports "a little bit every now and then" (02/05/2018)    Time  6    Period  Weeks    Status  Achieved            Plan - 10/06/18 1921    Clinical Impression Statement  Worked on stand <> floor transfer per pt request to promote ability to get into and out of her bath tub. Able to perform with B UE assist and Min A from PT. Contiued working on L glute med, max, and quadriceps strengthening to help decrease L knee pain, back pain, and promote strength with the aforementioned transfer. Decreased back pain and no L knee pain after session. Pt will benefit from continued skilled physical therapy services to decrease pain, improve strength, and function.    Personal Factors and Comorbidities  Age;Comorbidity 3+;Time since onset of injury/illness/exacerbation;Fitness;Past/Current Experience    Comorbidities  Back, knee pain, hx of dizziness, hx of back surgery, hx of C-section    Examination-Activity Limitations  Squat    Stability/Clinical Decision Making  Stable/Uncomplicated    Rehab Potential  Fair    Clinical Impairments Affecting Rehab Potential  difficulty with carry-over of improvements in clinic, chronicity of condition, age, multiple problem areas    PT Frequency  1x / week    PT Duration  6 weeks    PT Treatment/Interventions  Therapeutic activities;Therapeutic exercise;Balance training;Neuromuscular re-education;Patient/family education;Manual techniques;Dry needling;Aquatic Therapy;Electrical Stimulation;Iontophoresis 86m/ml Dexamethasone;Gait training;Canalith  Repostioning;Vestibular    PT Next Visit Plan   hip and knee strengthening, core strengthening, patellar mobility, manual techniques, modalities PRN    Consulted and Agree with Plan of Care  Patient       Patient will benefit from skilled therapeutic intervention in order to improve the following deficits and impairments:  Pain, Improper body mechanics, Postural dysfunction, Dizziness, Decreased strength, Difficulty walking, Decreased balance  Visit Diagnosis: 1. Radiculopathy, lumbar region   2. Muscle weakness (generalized)   3. Chronic pain of left knee   4. History of falling        Problem List Patient Active Problem List   Diagnosis Date Noted  . Obese 06/20/2016  . Status post right knee replacement 06/19/2016  . S/P left TKA 06/18/2016  . Spinal stenosis of lumbar region 09/14/2015    MJoneen BoersPT, DPT   10/06/2018, 7:28 PM  [facility] [address] [place] [address]Phone: [phone]  Fax:  [phone] Name: Destiny LLANASMRN: [id number] of Birth: [birth year]

## 2018-10-13 ENCOUNTER — Ambulatory Visit: Payer: Medicare Other

## 2018-10-17 ENCOUNTER — Other Ambulatory Visit: Payer: Self-pay

## 2018-10-17 ENCOUNTER — Ambulatory Visit: Payer: Medicare Other

## 2018-10-17 DIAGNOSIS — M5416 Radiculopathy, lumbar region: Secondary | ICD-10-CM

## 2018-10-17 DIAGNOSIS — Z9181 History of falling: Secondary | ICD-10-CM

## 2018-10-17 DIAGNOSIS — G8929 Other chronic pain: Secondary | ICD-10-CM

## 2018-10-17 DIAGNOSIS — M6281 Muscle weakness (generalized): Secondary | ICD-10-CM

## 2018-10-17 DIAGNOSIS — M25562 Pain in left knee: Secondary | ICD-10-CM

## 2018-10-17 NOTE — Therapy (Signed)
[facility] [address], [place], [address] Phone: [phone]   Fax:  [phone]  Physical Therapy Treatment  Patient Details  Name: Destiny Shaw MRN: [id number] Date of Birth: Jul 04, 1950 No data recorded  Encounter Date: 10/17/2018  PT End of Session - 10/17/18 1028    Visit Number  48    Number of Visits  20    Date for PT Re-Evaluation  11/06/18    Authorization Type  2    Authorization Time Period  of 10 progress note Medicare    PT Start Time  1028    PT Stop Time  1118    PT Time Calculation (min)  50 min    Activity Tolerance  Patient tolerated treatment well;No increased pain    Behavior During Therapy  WFL for tasks assessed/performed       Past Medical History:  Diagnosis Date  . ADD (attention deficit disorder)   . Anemia   . Anginal pain ([place])    pt states has occas chest pain relates to indigestion; pt uses rest to relieve   . Anxiety   . Arthritis   . Concussion   . Depression   . Diabetes mellitus without complication ([place])   . Dizziness   . Fall   . GERD (gastroesophageal reflux disease)   . Headache   . History of urinary tract infection   . Hyperlipidemia   . Hypertension   . Hypothyroidism   . IBS (irritable bowel syndrome)   . Imbalance   . Numbness    right leg   . Numbness in both hands    comes and goes   . Pneumonia    last episode approx 1 year ago   . Sleep apnea   . Wears glasses     Past Surgical History:  Procedure Laterality Date  . BREAST CYST ASPIRATION Left 1980's   neg  . BREAST CYST EXCISION Right 1980's   neg  . BREAST LUMPECTOMY Right   . BREAST SURGERY    . CARPAL TUNNEL RELEASE    . CESAREAN SECTION     times 2  . COLONOSCOPY WITH PROPOFOL N/A 05/11/2015   Procedure: COLONOSCOPY WITH PROPOFOL;  Surgeon: Manya Silvas, MD;  Location: [facility] ENDOSCOPY;  Service: Endoscopy;  Laterality: N/A;  . DE QUERVAIN'S RELEASE Right   .  ESOPHAGOGASTRODUODENOSCOPY (EGD) WITH PROPOFOL N/A 05/11/2015   Procedure: ESOPHAGOGASTRODUODENOSCOPY (EGD) WITH PROPOFOL;  Surgeon: Manya Silvas, MD;  Location: [facility] ENDOSCOPY;  Service: Endoscopy;  Laterality: N/A;  . EYE SURGERY     laser surgery bilat   . HERNIA REPAIR    . KNEE ARTHROSCOPY    . LUMBAR LAMINECTOMY/DECOMPRESSION MICRODISCECTOMY Bilateral 09/14/2015   Procedure: MICRO LUMBAR BILATERAL DECOMPRESSION L4 - L5;  Surgeon: Susa Day, MD;  Location: [facility] ORS;  Service: Orthopedics;  Laterality: Bilateral;  . REDUCTION MAMMAPLASTY Bilateral 1980  . RHINOPLASTY    . TONSILLECTOMY    . TOTAL KNEE ARTHROPLASTY Left 06/18/2016   Procedure: LEFT TOTAL KNEE ARTHROPLASTY;  Surgeon: Paralee Cancel, MD;  Location: [facility] ORS;  Service: Orthopedics;  Laterality: Left;  Adductor Block  . TUBAL LIGATION    . UVULOPALATOPHARYNGOPLASTY      There were no vitals filed for this visit.  Subjective Assessment - 10/17/18 1030    Subjective  L knee has been very stiff and painful for the past 3-4 days. Back is going into spasms more often. Does not  know if she has done anything. Tried getting into and out of the tub. Can step in and out of the tub. Can't sit on the tub though. Pt also states feeling weak first thing in the morning which gets better as she is up and moving around. 4/10 L low back pain. Feels spasms when she bends over and pick up something. L knee feels sensitive currently 5/10 L knee pain/soreness currently. Has not been doing her knee cap stuff.    Pertinent History  Low back pain. Pt states having back surgery about 2-3 years ago. After she had her L TKA, her back started aggravating her due to her walking. Pt also fell about 4 weeks ago onto her L knee. Dr. Alvan Dame checked out her L knee. L knee was fine but has some inflammation.  Pt tries to keep it iced. Pt still recovering for her L TKA but the fall set her back.   Her L knee surgery was last year.  L knee still bothers her a lot.  Had  surgery for her back before her L knee surgery. Was doing well until her L knee surgery. The fall made it worse. Feels debilitating. Also has a hard time getting into and out of the car mainly due to her L knee.   Pt fell due to her L knee bucking on her. Pt was trying to pick something up from the side of the couch.  Also has a hard time getting up from the floor.  Pt states having bladder accidents since after her fall (pt was recommended to tell her MD about it).  Bowel problems since taking medications (was constapated, but after taking medications, pt had diarrhea which was difficult to control. Getting out of the metformin fixed the bowel issue).  Pt states tingling and numbness L lateral LE along the L5 dermatome.  Denies saddle anesthesia.  Pt states that her back problem is mainly on her L side.  Pt landed on her L knee when she fell.  No other falls within the last 6 months. Pt also states being very dizzy since her L knee surgery.  Had PT treatement for her dizziness before which did not help.  The room feels like it is spinning when she gets dizzy.  I feel like it is getting better then she does something.     Currently in Pain?  Yes    Pain Score  4     Pain Location  Back    Pain Orientation  Left                               PT Education - 10/17/18 1118    Education Details  ther-ex, HEP    Person(s) Educated  Patient    Methods  Explanation;Demonstration;Tactile cues;Verbal cues;Handout    Comprehension  Returned demonstration;Verbalized understanding       Objectives    MedbridgeAccess Code: [email or web address]   Latex free bands used if used   Pt also adds that she wants to work on floor to stand transfers.  Sitting posture: L trunk rotation  Standing posture: slightLlateral shift  Pt states that pressing her back against the chair feels better.   Gait observation: increased L lateral lean during L LE stance phase.      Manual therapy  Myfascial release tissues L vastus lateralis area  Also taught pt how to perform at home. Pt demonstrated and verbalized undestanding.  Wrote instructions for patient  Decreased soreness  Seated medial glide L patella grade 3  Decreased L knee soreness  No L knee soreness after manual therapy   Therapeutic exercise  Pt demonstration stepping into the tub: L hip hike to clear foot.   S/L L hip abduction 10x3 to promote pelvic control during gait  Corrected her performance for HEP to target glute med muscle  L LE "running man" with R UE assist to promote L glute and quad strengthening             10x2, then 5x. Better able to perform compared to last session.    SLS L LE with B UE assist 10x5 seconds to promote glute med strengthening for 3 sets   Improved exercise technique, movement at target joints, use of target muscles after mod verbal, visual, tactile cues.      Response to treatment No L knee and decreased low back pain after session.   Clinical impression Worked on soft tissue mobilization to decrease stiffness L vastus lateralis area as well as mobilization of patella to promote better medial glide to improve mechanics at her L knee and decrease soreness. Performed the manual therapy to hopefully promote knee flexion ROM as well to decrease L hip hike when trying to clear her L LE over the bath tub when stepping into and out of it. Worked on L glute med and max muscle strengthening to decrease L trunk lean compensation for trendelenberg gait pattern and decrease stress to L low back structures during L LE stance phase of ambulation. No L knee soreness and improved level of low back comfort reported by pt after session.  Pt will benefit from continued skilled physical therapy services to decrease pain, improve strength and function.     PT Short Term Goals - 12/12/17 0930      PT SHORT TERM GOAL #1   Title  Patient will be independent  with her HEP to help decrease back and L knee pain and improve ability to perform functional tasks.     Time  3    Period  Weeks    Status  Achieved    Target Date  12/12/17        PT Long Term Goals - 09/22/18 1751      PT LONG TERM GOAL #1   Title  Patient will have a decrease in back pain to 3/10 or less at worst to promote ability to ambulate, turn in bed, perform standing tasks with less pain.     Baseline  6/10 back pain at most (09/24/2017); 7-8/10 at most for the past 7 days, duration of pain is better since starting PT (10/29/2017); 5-6/10 at most for the past 7 days (11/18/2017); 8/10 at most for the past 7 days but the duration of pain is less compared to prior to starting PT (12/12/2017).  7/10 back pain at most for the past 7 days (pt states doing her exercises helps decrease her pain; 01/02/2018); walking (3/10), standing (4/10), bed mobility (0/10) 02/05/2018; 6/10 back pain at most for the past 7 days (03/03/2018); 6.5/10 back pain at most for the past 7 days (03/27/2018); 7-8/10 at most (05/08/2018); 7/10 back pain at worst (07/22/2018);  7-8/10 at most for the past 7 days (09/22/2018)    Time  6    Period  Weeks    Status  On-going    Target Date  11/06/18      PT LONG TERM GOAL #2  Title  Patient will have a decrease in L knee pain to 3/10 or less at worst to promote ability to ambulate, negotiate stairs, get into and out of a car more comfortably.     Baseline  6/10 L knee pain at most for the past 3 months (09/24/2017); 7-8/10 at most for the past 7 days, duration of pain is better since starting PT (10/29/2017); 7/10 at most for the past 7 days (11/18/2017); 6/10 L knee pain at most for the past 7 days (12/12/2017); 5/10 L knee pain at most for the past 7 days (pt states doing her exercises helps decrease pain; 01/02/2018); L knee 3/10 (02/05/2018); 4/10 at most for the past 7 days (03/03/2018); 5.5/10 L knee at most for the past 7 days (03/27/2018); 6-7/10 L knee pain at most (05/08/2018);  7/10 L knee pain at worst for the past month (07/22/2018);  6/10 L knee pain at most for the past 7 days (09/22/2018)    Time  6    Period  Weeks    Status  On-going    Target Date  11/06/18      PT LONG TERM GOAL #3   Title  Patient will improve TUG time to 12 seconds or less as a demonstration of improved functional mobility and balance.     Baseline  TUG no AD: 16.05 seconds on average (09/24/2017); 14.3 seconds on average (10/29/2017); 13 seconds average (11/21/2017); 16.67 seconds average (12/12/2017);  14.17 seconds average (01/02/2018); 11.8 sec sonds (02/05/2018)    Time  6    Period  Weeks    Status  Achieved      PT LONG TERM GOAL #4   Title  Patient will improve her back FOTO score by at least 10 points as a demonstration of improved funtion.      Baseline  Back FOTO: 33 (09/24/2017); 38 (10/29/2017); 36 (11/21/2017); 40 (12/12/2017); 42 (01/02/2018); 47 (03/03/2018)    Time  6    Period  Weeks    Status  Achieved      PT LONG TERM GOAL #5   Title  Patient will improve bilateral LE strength by at least 1/2 MMT grade to promote ability to perform standing tasks.     Time  6    Period  Weeks    Status  Achieved      PT LONG TERM GOAL #6   Title  Patient will improve her Modified Oswestry Low Back pain disablity questionnaire by at least 10% as a demonstration of improved function.     Baseline  48% (09/24/2017); 46% (10/29/2017), (11/21/2017); 56% (12/12/2017); 36% (01/02/2018), (03/03/2018)    Time  6    Period  Weeks    Status  Achieved      PT LONG TERM GOAL #7   Title  Pt will report decreased difficulty stepping into and out of her bathtub as well as getting into and out of her truck to promote ability to get into and out of places.     Baseline  Pt reports difficulty stepping into and out of her bathtub as well as difficulty getting into and out of her truck (01/02/2018); Pt states the truck and bath tub and difficult and painful.  (02/05/2018); Improving per subjective reports  (03/03/2018); decreased difficulty with truck tranfser after cues for technique and practice, decreasing difficulty with stepping into and out of the bath tub based on subjective reports (03/27/2018 and 04/01/2018); Decreasing difficulty stepping into a bathtub. No difficulty getting  into and out of her truck (05/08/2018), (07/22/2018); Difficulty stepping into and out of the bath tub. No difficulty with truck transfers (09/22/2018)    Time  6    Period  Weeks    Status  Partially Met    Target Date  11/06/18      PT LONG TERM GOAL #8   Title  Patient will improve her BERG balance test score to 46/56 or more as a demonstration of decreased fall risk     Baseline  42/56 (01/02/2018); 54/56 (02/05/2018)    Time  6    Period  Weeks    Status  Achieved      PT LONG TERM GOAL  #9   TITLE  Pt will report minimal to no dizziness related to vertigo to promote balance and decrease fall risk.     Baseline  Pt reports dizziness (related to vertigo) which seem to affect her balance (01/15/2018); Patient reports "a little bit every now and then" (02/05/2018)    Time  6    Period  Weeks    Status  Achieved            Plan - 10/17/18 1200    Clinical Impression Statement  Worked on soft tissue mobilization to decrease stiffness L vastus lateralis area as well as mobilization of patella to promote better medial glide to improve mechanics at her L knee and decrease soreness. Performed the manual therapy to hopefully promote knee flexion ROM as well to decrease L hip hike when trying to clear her L LE over the bath tub when stepping into and out of it. Worked on L glute med and max muscle strengthening to decrease L trunk lean compensation for trendelenberg gait pattern and decrease stress to L low back structures during L LE stance phase of ambulation. No L knee soreness and improved level of low back comfort reported by pt after session.  Pt will benefit from continued skilled physical therapy services to  decrease pain, improve strength and function.    Personal Factors and Comorbidities  Age;Comorbidity 3+;Time since onset of injury/illness/exacerbation;Fitness;Past/Current Experience    Comorbidities  Back, knee pain, hx of dizziness, hx of back surgery, hx of C-section    Examination-Activity Limitations  Squat    Stability/Clinical Decision Making  Stable/Uncomplicated    Rehab Potential  Fair    Clinical Impairments Affecting Rehab Potential  difficulty with carry-over of improvements in clinic, chronicity of condition, age, multiple problem areas    PT Frequency  1x / week    PT Duration  6 weeks    PT Treatment/Interventions  Therapeutic activities;Therapeutic exercise;Balance training;Neuromuscular re-education;Patient/family education;Manual techniques;Dry needling;Aquatic Therapy;Electrical Stimulation;Iontophoresis 64m/ml Dexamethasone;Gait training;Canalith Repostioning;Vestibular    PT Next Visit Plan   hip and knee strengthening, core strengthening, patellar mobility, manual techniques, modalities PRN    Consulted and Agree with Plan of Care  Patient       Patient will benefit from skilled therapeutic intervention in order to improve the following deficits and impairments:  Pain, Improper body mechanics, Postural dysfunction, Dizziness, Decreased strength, Difficulty walking, Decreased balance  Visit Diagnosis: Radiculopathy, lumbar region  Muscle weakness (generalized)  Chronic pain of left knee  History of falling     Problem List Patient Active Problem List   Diagnosis Date Noted  . Obese 06/20/2016  . Status post right knee replacement 06/19/2016  . S/P left TKA 06/18/2016  . Spinal stenosis of lumbar region 09/14/2015    MJoneen BoersPT, DPT  10/17/2018, 12:05 PM  [facility] [address], [place], [address] Phone: [phone]   Fax:  [phone]  Name: Destiny Shaw MRN:  [id number] Date of Birth: 1950-11-22

## 2018-10-17 NOTE — Patient Instructions (Signed)
L knee manual  Gently press on a stiff area at the left side of your left thigh,    Move your finger around to find the stiffness.    Gently press your finger in that direction   You may feel the stiffness melt away.    "go with the flow"

## 2018-10-20 ENCOUNTER — Other Ambulatory Visit: Payer: Self-pay

## 2018-10-20 ENCOUNTER — Ambulatory Visit: Payer: Medicare Other

## 2018-10-20 DIAGNOSIS — M6281 Muscle weakness (generalized): Secondary | ICD-10-CM

## 2018-10-20 DIAGNOSIS — G8929 Other chronic pain: Secondary | ICD-10-CM

## 2018-10-20 DIAGNOSIS — Z9181 History of falling: Secondary | ICD-10-CM

## 2018-10-20 DIAGNOSIS — M5416 Radiculopathy, lumbar region: Secondary | ICD-10-CM

## 2018-10-20 NOTE — Therapy (Signed)
[facility] [address], [place], [address] Phone: [phone]   Fax:  [phone]  Physical Therapy Treatment  Patient Details  Name: Destiny Shaw MRN: [id number] Date of Birth: Jan 14, 1951 No data recorded  Encounter Date: 10/20/2018  PT End of Session - 10/20/18 1536    Visit Number  49    Number of Visits  67    Date for PT Re-Evaluation  11/06/18    Authorization Type  3    Authorization Time Period  of 10 progress note Medicare    PT Start Time  1347    PT Stop Time  1430    PT Time Calculation (min)  43 min    Equipment Utilized During Treatment  Gait belt    Activity Tolerance  Patient tolerated treatment well    Behavior During Therapy  WFL for tasks assessed/performed       Past Medical History:  Diagnosis Date  . ADD (attention deficit disorder)   . Anemia   . Anginal pain ([place])    pt states has occas chest pain relates to indigestion; pt uses rest to relieve   . Anxiety   . Arthritis   . Concussion   . Depression   . Diabetes mellitus without complication ([place])   . Dizziness   . Fall   . GERD (gastroesophageal reflux disease)   . Headache   . History of urinary tract infection   . Hyperlipidemia   . Hypertension   . Hypothyroidism   . IBS (irritable bowel syndrome)   . Imbalance   . Numbness    right leg   . Numbness in both hands    comes and goes   . Pneumonia    last episode approx 1 year ago   . Sleep apnea   . Wears glasses     Past Surgical History:  Procedure Laterality Date  . BREAST CYST ASPIRATION Left 1980's   neg  . BREAST CYST EXCISION Right 1980's   neg  . BREAST LUMPECTOMY Right   . BREAST SURGERY    . CARPAL TUNNEL RELEASE    . CESAREAN SECTION     times 2  . COLONOSCOPY WITH PROPOFOL N/A 05/11/2015   Procedure: COLONOSCOPY WITH PROPOFOL;  Surgeon: Manya Silvas, MD;  Location[facility] ENDOSCOPY;  Service: Endoscopy;  Laterality: N/A;  . DE  QUERVAIN'S RELEASE Right   . ESOPHAGOGASTRODUODENOSCOPY (EGD) WITH PROPOFOL N/A 05/11/2015   Procedure: ESOPHAGOGASTRODUODENOSCOPY (EGD) WITH PROPOFOL;  Surgeon: Manya Silvas, MD;  Location[facility] ENDOSCOPY;  Service: Endoscopy;  Laterality: N/A;  . EYE SURGERY     laser surgery bilat   . HERNIA REPAIR    . KNEE ARTHROSCOPY    . LUMBAR LAMINECTOMY/DECOMPRESSION MICRODISCECTOMY Bilateral 09/14/2015   Procedure: MICRO LUMBAR BILATERAL DECOMPRESSION L4 - L5;  Surgeon: Susa Day, MD;  Location: [facility] ORS;  Service: Orthopedics;  Laterality: Bilateral;  . REDUCTION MAMMAPLASTY Bilateral 1980  . RHINOPLASTY    . TONSILLECTOMY    . TOTAL KNEE ARTHROPLASTY Left 06/18/2016   Procedure: LEFT TOTAL KNEE ARTHROPLASTY;  Surgeon: Paralee Cancel, MD;  Location: [facility] ORS;  Service: Orthopedics;  Laterality: Left;  Adductor Block  . TUBAL LIGATION    . UVULOPALATOPHARYNGOPLASTY      There were no vitals filed for this visit.  Subjective Assessment - 10/20/18 1355    Subjective  Patient reported that today felt like a good day. That her pain is manageable,  and she has been working on her HEP. Back pain and L knee pain about the same this session.    Pertinent History  Low back pain. Pt states having back surgery about 2-3 years ago. After she had her L TKA, her back started aggravating her due to her walking. Pt also fell about 4 weeks ago onto her L knee. Dr. Alvan Dame checked out her L knee. L knee was fine but has some inflammation.  Pt tries to keep it iced. Pt still recovering for her L TKA but the fall set her back.   Her L knee surgery was last year.  L knee still bothers her a lot.  Had surgery for her back before her L knee surgery. Was doing well until her L knee surgery. The fall made it worse. Feels debilitating. Also has a hard time getting into and out of the car mainly due to her L knee.   Pt fell due to her L knee bucking on her. Pt was trying to pick something up from the side of the couch.  Also has a  hard time getting up from the floor.  Pt states having bladder accidents since after her fall (pt was recommended to tell her MD about it).  Bowel problems since taking medications (was constapated, but after taking medications, pt had diarrhea which was difficult to control. Getting out of the metformin fixed the bowel issue).  Pt states tingling and numbness L lateral LE along the L5 dermatome.  Denies saddle anesthesia.  Pt states that her back problem is mainly on her L side.  Pt landed on her L knee when she fell.  No other falls within the last 6 months. Pt also states being very dizzy since her L knee surgery.  Had PT treatement for her dizziness before which did not help.  The room feels like it is spinning when she gets dizzy.  I feel like it is getting better then she does something.     Patient Stated Goals  Be better able to get into and out of her vehicle (midsized truck), into and out of chairs, be better able to roll in her bed with less L knee pain. Be able to get down on the floor and get back up.     Currently in Pain?  Yes    Pain Score  5     Pain Location  Back   and knee   Pain Orientation  Left    Pain Descriptors / Indicators  Tender    Pain Type  Chronic pain    Pain Onset  More than a month ago        Medbridge Access Code: [email or web address] (given separate handout for piriformis stretch)     objective:   Latex free bands used if used    Sitting posture: L trunk rotation   Standing posture: slight L lateral shift    Pt states that pressing her back against the chair feels better.    Gait observation: increased L lateral lean during L LE stance phase.        Manual therapy   Myfascial release tissues L vastus lateralis area, superficial and deep techniques utilized   Seated medial glide L patella grade 3    Therapeutic exercise   3x30secs piriformis stretch (knee towards opposite shoulder) on L and R  Pt demonstration stepping into the tub: L hip hike to clear  foot.    S/L L hip abduction 10x3 to  promote pelvic control during gait, tactile cues for form              L LE "running man" with R UE assist to promote L glute and quad strengthening             cues for pelvis level, glute activation, visual cues   stair navigation B rails x3, unilateral rails x4 step and no rails x4 over step pattern, some instability noted without rail support but overall safe, supervision provided. No LOB noted  Steps base +2 R leg leading then L leg leading x8 each side   SLS L LE with B UE assist 10x5 seconds to promote glute med strengthening for 3 sets  Improved exercise technique, movement at target joints, use of target muscles after mod verbal, visual, tactile cues.     Pt response/clinical impression: Patient highly motivated throughout session, very pleased with progress with PT overall. Pt was excited to perform stair navigation with no increase in pain and decreased UE support. Patient intermittently cued for exercise technique to improve exercise form and technique, piriformis/lateral hip stretch administered to HEP. The patient would benefit from further skilled PT intervention to maximize functional abilities, safety, and QOL.     PT Education - 10/20/18 1533    Education Details  ther-ex, HEP    Person(s) Educated  Patient    Methods  Explanation;Demonstration;Tactile cues;Verbal cues    Comprehension  Verbalized understanding;Returned demonstration;Need further instruction       PT Short Term Goals - 12/12/17 0930      PT SHORT TERM GOAL #1   Title  Patient will be independent with her HEP to help decrease back and L knee pain and improve ability to perform functional tasks.     Time  3    Period  Weeks    Status  Achieved    Target Date  12/12/17        PT Long Term Goals - 09/22/18 1751      PT LONG TERM GOAL #1   Title  Patient will have a decrease in back pain to 3/10 or less at worst to promote ability to ambulate, turn in bed,  perform standing tasks with less pain.     Baseline  6/10 back pain at most (09/24/2017); 7-8/10 at most for the past 7 days, duration of pain is better since starting PT (10/29/2017); 5-6/10 at most for the past 7 days (11/18/2017); 8/10 at most for the past 7 days but the duration of pain is less compared to prior to starting PT (12/12/2017).  7/10 back pain at most for the past 7 days (pt states doing her exercises helps decrease her pain; 01/02/2018); walking (3/10), standing (4/10), bed mobility (0/10) 02/05/2018; 6/10 back pain at most for the past 7 days (03/03/2018); 6.5/10 back pain at most for the past 7 days (03/27/2018); 7-8/10 at most (05/08/2018); 7/10 back pain at worst (07/22/2018);  7-8/10 at most for the past 7 days (09/22/2018)    Time  6    Period  Weeks    Status  On-going    Target Date  11/06/18      PT LONG TERM GOAL #2   Title  Patient will have a decrease in L knee pain to 3/10 or less at worst to promote ability to ambulate, negotiate stairs, get into and out of a car more comfortably.     Baseline  6/10 L knee pain at most for the past 3  months (09/24/2017); 7-8/10 at most for the past 7 days, duration of pain is better since starting PT (10/29/2017); 7/10 at most for the past 7 days (11/18/2017); 6/10 L knee pain at most for the past 7 days (12/12/2017); 5/10 L knee pain at most for the past 7 days (pt states doing her exercises helps decrease pain; 01/02/2018); L knee 3/10 (02/05/2018); 4/10 at most for the past 7 days (03/03/2018); 5.5/10 L knee at most for the past 7 days (03/27/2018); 6-7/10 L knee pain at most (05/08/2018); 7/10 L knee pain at worst for the past month (07/22/2018);  6/10 L knee pain at most for the past 7 days (09/22/2018)    Time  6    Period  Weeks    Status  On-going    Target Date  11/06/18      PT LONG TERM GOAL #3   Title  Patient will improve TUG time to 12 seconds or less as a demonstration of improved functional mobility and balance.     Baseline  TUG no AD:  16.05 seconds on average (09/24/2017); 14.3 seconds on average (10/29/2017); 13 seconds average (11/21/2017); 16.67 seconds average (12/12/2017);  14.17 seconds average (01/02/2018); 11.8 sec sonds (02/05/2018)    Time  6    Period  Weeks    Status  Achieved      PT LONG TERM GOAL #4   Title  Patient will improve her back FOTO score by at least 10 points as a demonstration of improved funtion.      Baseline  Back FOTO: 33 (09/24/2017); 38 (10/29/2017); 36 (11/21/2017); 40 (12/12/2017); 42 (01/02/2018); 47 (03/03/2018)    Time  6    Period  Weeks    Status  Achieved      PT LONG TERM GOAL #5   Title  Patient will improve bilateral LE strength by at least 1/2 MMT grade to promote ability to perform standing tasks.     Time  6    Period  Weeks    Status  Achieved      PT LONG TERM GOAL #6   Title  Patient will improve her Modified Oswestry Low Back pain disablity questionnaire by at least 10% as a demonstration of improved function.     Baseline  48% (09/24/2017); 46% (10/29/2017), (11/21/2017); 56% (12/12/2017); 36% (01/02/2018), (03/03/2018)    Time  6    Period  Weeks    Status  Achieved      PT LONG TERM GOAL #7   Title  Pt will report decreased difficulty stepping into and out of her bathtub as well as getting into and out of her truck to promote ability to get into and out of places.     Baseline  Pt reports difficulty stepping into and out of her bathtub as well as difficulty getting into and out of her truck (01/02/2018); Pt states the truck and bath tub and difficult and painful.  (02/05/2018); Improving per subjective reports (03/03/2018); decreased difficulty with truck tranfser after cues for technique and practice, decreasing difficulty with stepping into and out of the bath tub based on subjective reports (03/27/2018 and 04/01/2018); Decreasing difficulty stepping into a bathtub. No difficulty getting into and out of her truck (05/08/2018), (07/22/2018); Difficulty stepping into and out of the bath tub.  No difficulty with truck transfers (09/22/2018)    Time  6    Period  Weeks    Status  Partially Met    Target Date  11/06/18  PT LONG TERM GOAL #8   Title  Patient will improve her BERG balance test score to 46/56 or more as a demonstration of decreased fall risk     Baseline  42/56 (01/02/2018); 54/56 (02/05/2018)    Time  6    Period  Weeks    Status  Achieved      PT LONG TERM GOAL  #9   TITLE  Pt will report minimal to no dizziness related to vertigo to promote balance and decrease fall risk.     Baseline  Pt reports dizziness (related to vertigo) which seem to affect her balance (01/15/2018); Patient reports "a little bit every now and then" (02/05/2018)    Time  6    Period  Weeks    Status  Achieved            Plan - 10/20/18 1533    Clinical Impression Statement  Patient highly motivated throughout session, very pleased with progress with PT overall. Pt was excited to perform stair navigation with no increase in pain and decreased UE support. Patient intermittently cued for exercise technique to improve exercise form and technique, piriformis/lateral hip stretch administered to HEP. The patient would benefit from further skilled PT intervention to maximize functional abilities, safety, and QOL.    Personal Factors and Comorbidities  Age;Comorbidity 3+;Time since onset of injury/illness/exacerbation;Fitness;Past/Current Experience    Comorbidities  Back, knee pain, hx of dizziness, hx of back surgery, hx of C-section    Examination-Activity Limitations  Squat    Stability/Clinical Decision Making  Stable/Uncomplicated    Rehab Potential  Fair    Clinical Impairments Affecting Rehab Potential  difficulty with carry-over of improvements in clinic, chronicity of condition, age, multiple problem areas    PT Frequency  1x / week    PT Duration  6 weeks    PT Treatment/Interventions  Therapeutic activities;Therapeutic exercise;Balance training;Neuromuscular  re-education;Patient/family education;Manual techniques;Dry needling;Aquatic Therapy;Electrical Stimulation;Iontophoresis 89m/ml Dexamethasone;Gait training;Canalith Repostioning;Vestibular    PT Next Visit Plan   hip and knee strengthening, core strengthening, patellar mobility, manual techniques, modalities PRN    Consulted and Agree with Plan of Care  Patient       Patient will benefit from skilled therapeutic intervention in order to improve the following deficits and impairments:  Pain, Improper body mechanics, Postural dysfunction, Dizziness, Decreased strength, Difficulty walking, Decreased balance  Visit Diagnosis: Radiculopathy, lumbar region  Muscle weakness (generalized)  Chronic pain of left knee  History of falling     Problem List Patient Active Problem List   Diagnosis Date Noted  . Obese 06/20/2016  . Status post right knee replacement 06/19/2016  . S/P left TKA 06/18/2016  . Spinal stenosis of lumbar region 09/14/2015    DLieutenant DiegoPT, DPT 3:39 PM,10/20/18 3[facility] [address] [place] [id number]: [phone]  Fax:  [phone] Name: Destiny BLASDELMRN: [id number] of Birth: 405-05-1950

## 2018-10-27 ENCOUNTER — Ambulatory Visit: Payer: Medicare Other

## 2018-10-27 ENCOUNTER — Other Ambulatory Visit: Payer: Self-pay

## 2018-10-27 DIAGNOSIS — M5416 Radiculopathy, lumbar region: Secondary | ICD-10-CM

## 2018-10-27 DIAGNOSIS — G8929 Other chronic pain: Secondary | ICD-10-CM

## 2018-10-27 DIAGNOSIS — M6281 Muscle weakness (generalized): Secondary | ICD-10-CM

## 2018-10-27 DIAGNOSIS — Z9181 History of falling: Secondary | ICD-10-CM

## 2018-10-27 NOTE — Therapy (Signed)
[facility] [address] [place], [address] Phone: [phone]   Fax:  [phone]  Physical Therapy Treatment And Progress Report (09/22/2018 - 10/27/2018)  Patient Details  Name: Destiny Shaw MRN: [id number] Date of Birth: 1950-11-04 No data recorded  Encounter Date: 10/27/2018  PT End of Session - 10/27/18 1348    Visit Number  50    Number of Visits  28    Date for PT Re-Evaluation  11/06/18    Authorization Type  "10"    Authorization Time Period  of 10 progress note Medicare    PT Start Time  1348    PT Stop Time  1429    PT Time Calculation (min)  41 min    Equipment Utilized During Treatment  --    Activity Tolerance  Patient tolerated treatment well    Behavior During Therapy  WFL for tasks assessed/performed       Past Medical History:  Diagnosis Date  . ADD (attention deficit disorder)   . Anemia   . Anginal pain ([place])    pt states has occas chest pain relates to indigestion; pt uses rest to relieve   . Anxiety   . Arthritis   . Concussion   . Depression   . Diabetes mellitus without complication ([place])   . Dizziness   . Fall   . GERD (gastroesophageal reflux disease)   . Headache   . History of urinary tract infection   . Hyperlipidemia   . Hypertension   . Hypothyroidism   . IBS (irritable bowel syndrome)   . Imbalance   . Numbness    right leg   . Numbness in both hands    comes and goes   . Pneumonia    last episode approx 1 year ago   . Sleep apnea   . Wears glasses     Past Surgical History:  Procedure Laterality Date  . BREAST CYST ASPIRATION Left 1980's   neg  . BREAST CYST EXCISION Right 1980's   neg  . BREAST LUMPECTOMY Right   . BREAST SURGERY    . CARPAL TUNNEL RELEASE    . CESAREAN SECTION     times 2  . COLONOSCOPY WITH PROPOFOL N/A 05/11/2015   Procedure: COLONOSCOPY WITH PROPOFOL;  Surgeon: Manya Silvas, MD;  [facility];  Service:  Endoscopy;  Laterality: N/A;  . DE QUERVAIN'S RELEASE Right   . ESOPHAGOGASTRODUODENOSCOPY (EGD) WITH PROPOFOL N/A 05/11/2015   Procedure: ESOPHAGOGASTRODUODENOSCOPY (EGD) WITH PROPOFOL;  Surgeon: Manya Silvas, MD;  Location: [facility] ENDOSCOPY;  Service: Endoscopy;  Laterality: N/A;  . EYE SURGERY     laser surgery bilat   . HERNIA REPAIR    . KNEE ARTHROSCOPY    . LUMBAR LAMINECTOMY/DECOMPRESSION MICRODISCECTOMY Bilateral 09/14/2015   Procedure: MICRO LUMBAR BILATERAL DECOMPRESSION L4 - L5;  Surgeon: Susa Day, MD;  Location: [facility] ORS;  Service: Orthopedics;  Laterality: Bilateral;  . REDUCTION MAMMAPLASTY Bilateral 1980  . RHINOPLASTY    . TONSILLECTOMY    . TOTAL KNEE ARTHROPLASTY Left 06/18/2016   Procedure: LEFT TOTAL KNEE ARTHROPLASTY;  Surgeon: Paralee Cancel, MD;  Location: [facility] ORS;  Service: Orthopedics;  Laterality: Left;  Adductor Block  . TUBAL LIGATION    . UVULOPALATOPHARYNGOPLASTY      There were no vitals filed for this visit.  Subjective Assessment - 10/27/18 1349    Subjective  Did the steps last week and worked on it  at home. Feels B quadriceps soreness. Legs also feels like it gets weak. Has been working on some of her exercises. Back is a little more sensitive today. Crossing her leg bothers her back as well.    Pertinent History  Low back pain. Pt states having back surgery about 2-3 years ago. After she had her L TKA, her back started aggravating her due to her walking. Pt also fell about 4 weeks ago onto her L knee. Dr. Alvan Dame checked out her L knee. L knee was fine but has some inflammation.  Pt tries to keep it iced. Pt still recovering for her L TKA but the fall set her back.   Her L knee surgery was last year.  L knee still bothers her a lot.  Had surgery for her back before her L knee surgery. Was doing well until her L knee surgery. The fall made it worse. Feels debilitating. Also has a hard time getting into and out of the car mainly due to her L knee.   Pt fell due  to her L knee bucking on her. Pt was trying to pick something up from the side of the couch.  Also has a hard time getting up from the floor.  Pt states having bladder accidents since after her fall (pt was recommended to tell her MD about it).  Bowel problems since taking medications (was constapated, but after taking medications, pt had diarrhea which was difficult to control. Getting out of the metformin fixed the bowel issue).  Pt states tingling and numbness L lateral LE along the L5 dermatome.  Denies saddle anesthesia.  Pt states that her back problem is mainly on her L side.  Pt landed on her L knee when she fell.  No other falls within the last 6 months. Pt also states being very dizzy since her L knee surgery.  Had PT treatement for her dizziness before which did not help.  The room feels like it is spinning when she gets dizzy.  I feel like it is getting better then she does something.  5/10 low back soreness currently (7/10 at most for the past 7 days after negotiating stairs, 5/10 without doing stairs). 4/10 L knee pain currenglty with gait 5/10 L knee pain at most for the past 7 days.    Patient Stated Goals  Be better able to get into and out of her vehicle (midsized truck), into and out of chairs, be better able to roll in her bed with less L knee pain. Be able to get down on the floor and get back up.     Currently in Pain?  Yes    Pain Score  5     Pain Location  Back    Pain Onset  More than a month ago                               PT Education - 10/27/18 1358    Education Details  ther-ex    Person(s) Educated  Patient    Methods  Explanation;Demonstration;Tactile cues;Verbal cues    Comprehension  Returned demonstration;Verbalized understanding       Objectives   MedbridgeAccess Code: [email or web address](given separate handout for piriformis stretch)   Latex free bands usedif used   Sitting posture: L trunk rotation  Standing posture:  slightLlateral shift  Pt states that pressing her back against the chair feels better.  Gait observation: increased L  lateral lean during L LE stance phase.      Therapeutic exercise  Seated transversus abdominis contraction 10x5 seconds   Then 10x10 seconds    single leg dead lift with one UE assist 10x3  L 10x3  R 10x, then 5x (more difficulty for R LE)    Decreased back pain after exercise   S/L L hip abduction 10x2to promote pelvic control during gait, tactile cues for form   stair navigation B rails x7 reciprocal pattern.   Emphasis on femoral control, mirror cues provided. Difficulty with femoral control L > R  SLS L LE with B UE assist 10x5 secondsto promote glute med strengtheningfor 3 sets   Improved exercise technique, movement at target joints, use of target muscles after min to mod verbal, visual, tactile cues.      Response to treatment Decreased back ("back feels good") and L knee pain (4/10) after session  Clinical impression Decreased low back pain with exercises to promote glute max muscle activation. Demonstrates decreased B femoral control L > R which may play a factor with difficulty with carry over of decreased back and knee pain. Continued working on glute med and max strength to decrease low back extension pressure, and promote better mechanics at her knee and decrease L knee pain with activity. Pt will benefit from continued skilled physical therapy services to decrease pain, improve strength and function.       PT Short Term Goals - 12/12/17 0930      PT SHORT TERM GOAL #1   Title  Patient will be independent with her HEP to help decrease back and L knee pain and improve ability to perform functional tasks.     Time  3    Period  Weeks    Status  Achieved    Target Date  12/12/17        PT Long Term Goals - 10/27/18 1409      PT LONG TERM GOAL #1   Title  Patient will have a decrease in back pain to 3/10 or  less at worst to promote ability to ambulate, turn in bed, perform standing tasks with less pain.     Baseline  6/10 back pain at most (09/24/2017); 7-8/10 at most for the past 7 days, duration of pain is better since starting PT (10/29/2017); 5-6/10 at most for the past 7 days (11/18/2017); 8/10 at most for the past 7 days but the duration of pain is less compared to prior to starting PT (12/12/2017).  7/10 back pain at most for the past 7 days (pt states doing her exercises helps decrease her pain; 01/02/2018); walking (3/10), standing (4/10), bed mobility (0/10) 02/05/2018; 6/10 back pain at most for the past 7 days (03/03/2018); 6.5/10 back pain at most for the past 7 days (03/27/2018); 7-8/10 at most (05/08/2018); 7/10 back pain at worst (07/22/2018);  7-8/10 at most for the past 7 days (09/22/2018); 5-7/10 at worst for the past 7 days then calms down (10/27/2018)    Time  6    Period  Weeks    Status  On-going    Target Date  11/06/18      PT LONG TERM GOAL #2   Title  Patient will have a decrease in L knee pain to 3/10 or less at worst to promote ability to ambulate, negotiate stairs, get into and out of a car more comfortably.     Baseline  6/10 L knee pain at most for the past  3 months (09/24/2017); 7-8/10 at most for the past 7 days, duration of pain is better since starting PT (10/29/2017); 7/10 at most for the past 7 days (11/18/2017); 6/10 L knee pain at most for the past 7 days (12/12/2017); 5/10 L knee pain at most for the past 7 days (pt states doing her exercises helps decrease pain; 01/02/2018); L knee 3/10 (02/05/2018); 4/10 at most for the past 7 days (03/03/2018); 5.5/10 L knee at most for the past 7 days (03/27/2018); 6-7/10 L knee pain at most (05/08/2018); 7/10 L knee pain at worst for the past month (07/22/2018);  6/10 L knee pain at most for the past 7 days (09/22/2018); 5-6/10 at most for the past 7 days sporadically, can get it better with exercise (10/27/2018)    Time  6    Period  Weeks    Status   On-going    Target Date  11/06/18      PT LONG TERM GOAL #3   Title  Patient will improve TUG time to 12 seconds or less as a demonstration of improved functional mobility and balance.     Baseline  TUG no AD: 16.05 seconds on average (09/24/2017); 14.3 seconds on average (10/29/2017); 13 seconds average (11/21/2017); 16.67 seconds average (12/12/2017);  14.17 seconds average (01/02/2018); 11.8 sec sonds (02/05/2018)    Time  6    Period  Weeks    Status  Achieved      PT LONG TERM GOAL #4   Title  Patient will improve her back FOTO score by at least 10 points as a demonstration of improved funtion.      Baseline  Back FOTO: 33 (09/24/2017); 38 (10/29/2017); 36 (11/21/2017); 40 (12/12/2017); 42 (01/02/2018); 47 (03/03/2018)    Time  6    Period  Weeks    Status  Achieved      PT LONG TERM GOAL #5   Title  Patient will improve bilateral LE strength by at least 1/2 MMT grade to promote ability to perform standing tasks.     Time  6    Period  Weeks    Status  Achieved      PT LONG TERM GOAL #6   Title  Patient will improve her Modified Oswestry Low Back pain disablity questionnaire by at least 10% as a demonstration of improved function.     Baseline  48% (09/24/2017); 46% (10/29/2017), (11/21/2017); 56% (12/12/2017); 36% (01/02/2018), (03/03/2018)    Time  6    Period  Weeks    Status  Achieved      PT LONG TERM GOAL #7   Title  Pt will report decreased difficulty stepping into and out of her bathtub as well as getting into and out of her truck to promote ability to get into and out of places.     Baseline  Pt reports difficulty stepping into and out of her bathtub as well as difficulty getting into and out of her truck (01/02/2018); Pt states the truck and bath tub and difficult and painful.  (02/05/2018); Improving per subjective reports (03/03/2018); decreased difficulty with truck tranfser after cues for technique and practice, decreasing difficulty with stepping into and out of the bath tub based on  subjective reports (03/27/2018 and 04/01/2018); Decreasing difficulty stepping into a bathtub. No difficulty getting into and out of her truck (05/08/2018), (07/22/2018); Difficulty stepping into and out of the bath tub. No difficulty with truck transfers (09/22/2018); Able to get into her bath tub, difficulty getting  out (10/27/18)    Time  6    Period  Weeks    Status  Partially Met    Target Date  11/06/18      PT LONG TERM GOAL #8   Title  Patient will improve her BERG balance test score to 46/56 or more as a demonstration of decreased fall risk     Baseline  42/56 (01/02/2018); 54/56 (02/05/2018)    Time  6    Period  Weeks    Status  Achieved      PT LONG TERM GOAL  #9   TITLE  Pt will report minimal to no dizziness related to vertigo to promote balance and decrease fall risk.     Baseline  Pt reports dizziness (related to vertigo) which seem to affect her balance (01/15/2018); Patient reports "a little bit every now and then" (02/05/2018)    Time  6    Period  Weeks    Status  Achieved            Plan - 10/27/18 1358    Clinical Impression Statement  Decreased low back pain with exercises to promote glute max muscle activation. Demonstrates decreased B femoral control L > R which may play a factor with difficulty with carry over of decreased back and knee pain. Continued working on glute med and max strength to decrease low back extension pressure, and promote better mechanics at her knee and decrease L knee pain with activity. Pt will benefit from continued skilled physical therapy services to decrease pain, improve strength and function.    Personal Factors and Comorbidities  Age;Comorbidity 3+;Time since onset of injury/illness/exacerbation;Fitness;Past/Current Experience    Comorbidities  Back, knee pain, hx of dizziness, hx of back surgery, hx of C-section    Examination-Activity Limitations  Squat    Stability/Clinical Decision Making  Stable/Uncomplicated    Rehab Potential   Fair    Clinical Impairments Affecting Rehab Potential  difficulty with carry-over of improvements in clinic, chronicity of condition, age, multiple problem areas    PT Frequency  1x / week    PT Duration  6 weeks    PT Treatment/Interventions  Therapeutic activities;Therapeutic exercise;Balance training;Neuromuscular re-education;Patient/family education;Manual techniques;Dry needling;Aquatic Therapy;Electrical Stimulation;Iontophoresis 69m/ml Dexamethasone;Gait training;Canalith Repostioning;Vestibular    PT Next Visit Plan   hip and knee strengthening, core strengthening, patellar mobility, manual techniques, modalities PRN    Consulted and Agree with Plan of Care  Patient       Patient will benefit from skilled therapeutic intervention in order to improve the following deficits and impairments:  Pain, Improper body mechanics, Postural dysfunction, Dizziness, Decreased strength, Difficulty walking, Decreased balance  Visit Diagnosis: Radiculopathy, lumbar region  Muscle weakness (generalized)  Chronic pain of left knee  History of falling     Problem List Patient Active Problem List   Diagnosis Date Noted  . Obese 06/20/2016  . Status post right knee replacement 06/19/2016  . S/P left TKA 06/18/2016  . Spinal stenosis of lumbar region 09/14/2015    MJoneen BoersPT, DPT   10/27/2018, 7:37 PM  [facility] [address] [place] [address]Phone: [phone]  Fax:  [phone] Name: RKASUMI DITULLIOMRN: [id number] of Birth: [birth year]

## 2018-11-04 ENCOUNTER — Ambulatory Visit: Payer: Medicare Other | Attending: Orthopedic Surgery

## 2018-11-04 ENCOUNTER — Other Ambulatory Visit: Payer: Self-pay

## 2018-11-04 DIAGNOSIS — M5416 Radiculopathy, lumbar region: Secondary | ICD-10-CM | POA: Insufficient documentation

## 2018-11-04 DIAGNOSIS — G8929 Other chronic pain: Secondary | ICD-10-CM | POA: Insufficient documentation

## 2018-11-04 DIAGNOSIS — M6281 Muscle weakness (generalized): Secondary | ICD-10-CM | POA: Insufficient documentation

## 2018-11-04 DIAGNOSIS — R42 Dizziness and giddiness: Secondary | ICD-10-CM | POA: Insufficient documentation

## 2018-11-04 DIAGNOSIS — M25562 Pain in left knee: Secondary | ICD-10-CM | POA: Insufficient documentation

## 2018-11-04 DIAGNOSIS — Z9181 History of falling: Secondary | ICD-10-CM | POA: Diagnosis present

## 2018-11-04 NOTE — Therapy (Signed)
[facility] [address], [place], [address] Phone: [phone]   Fax:  [phone]  Physical Therapy Treatment  Patient Details  Name: DYLANIE QUESENBERRY MRN: [id number] Date of Birth: 1950-04-30 Referring Provider (PT): Paralee Cancel, MD   Encounter Date: 11/04/2018  PT End of Session - 11/04/18 1300    Visit Number  51    Number of Visits  82    Date for PT Re-Evaluation  12/18/18    Authorization Type  1    Authorization Time Period  of 10 progress note Medicare    PT Start Time  1300    PT Stop Time  1346    PT Time Calculation (min)  46 min    Activity Tolerance  Patient tolerated treatment well    Behavior During Therapy  [facility] for tasks assessed/performed       Past Medical History:  Diagnosis Date  . ADD (attention deficit disorder)   . Anemia   . Anginal pain ([place])    pt states has occas chest pain relates to indigestion; pt uses rest to relieve   . Anxiety   . Arthritis   . Concussion   . Depression   . Diabetes mellitus without complication ([place])   . Dizziness   . Fall   . GERD (gastroesophageal reflux disease)   . Headache   . History of urinary tract infection   . Hyperlipidemia   . Hypertension   . Hypothyroidism   . IBS (irritable bowel syndrome)   . Imbalance   . Numbness    right leg   . Numbness in both hands    comes and goes   . Pneumonia    last episode approx 1 year ago   . Sleep apnea   . Wears glasses     Past Surgical History:  Procedure Laterality Date  . BREAST CYST ASPIRATION Left 1980's   neg  . BREAST CYST EXCISION Right 1980's   neg  . BREAST LUMPECTOMY Right   . BREAST SURGERY    . CARPAL TUNNEL RELEASE    . CESAREAN SECTION     times 2  . COLONOSCOPY WITH PROPOFOL N/A 05/11/2015   Procedure: COLONOSCOPY WITH PROPOFOL;  Surgeon: Manya Silvas, MD;  Location: [facility] ENDOSCOPY;  Service: Endoscopy;  Laterality: N/A;  . DE QUERVAIN'S RELEASE Right   .  ESOPHAGOGASTRODUODENOSCOPY (EGD) WITH PROPOFOL N/A 05/11/2015   Procedure: ESOPHAGOGASTRODUODENOSCOPY (EGD) WITH PROPOFOL;  Surgeon: Manya Silvas, MD;  Location: [facility] ENDOSCOPY;  Service: Endoscopy;  Laterality: N/A;  . EYE SURGERY     laser surgery bilat   . HERNIA REPAIR    . KNEE ARTHROSCOPY    . LUMBAR LAMINECTOMY/DECOMPRESSION MICRODISCECTOMY Bilateral 09/14/2015   Procedure: MICRO LUMBAR BILATERAL DECOMPRESSION L4 - L5;  Surgeon: Susa Day, MD;  Location: [facility] ORS;  Service: Orthopedics;  Laterality: Bilateral;  . REDUCTION MAMMAPLASTY Bilateral 1980  . RHINOPLASTY    . TONSILLECTOMY    . TOTAL KNEE ARTHROPLASTY Left 06/18/2016   Procedure: LEFT TOTAL KNEE ARTHROPLASTY;  Surgeon: Paralee Cancel, MD;  Location: [facility] ORS;  Service: Orthopedics;  Laterality: Left;  Adductor Block  . TUBAL LIGATION    . UVULOPALATOPHARYNGOPLASTY      There were no vitals filed for this visit.  Subjective Assessment - 11/04/18 1301    Subjective  Feels a little tired. R low back has been aggravating her. L knee feels tight and sore. Also has a  neck pain and headache. Did not do exercises when her back was bothering her. Still has difficulty getting into the bath tub and sitting down in the bath tub. Also wants to be able to get out of her bath tub. Wants to continue working on those goals.    Pertinent History  Low back pain. Pt states having back surgery about 2-3 years ago. After she had her L TKA, her back started aggravating her due to her walking. Pt also fell about 4 weeks ago onto her L knee. Dr. Alvan Dame checked out her L knee. L knee was fine but has some inflammation.  Pt tries to keep it iced. Pt still recovering for her L TKA but the fall set her back.   Her L knee surgery was last year.  L knee still bothers her a lot.  Had surgery for her back before her L knee surgery. Was doing well until her L knee surgery. The fall made it worse. Feels debilitating. Also has a hard time getting into and out of the  car mainly due to her L knee.   Pt fell due to her L knee bucking on her. Pt was trying to pick something up from the side of the couch.  Also has a hard time getting up from the floor.  Pt states having bladder accidents since after her fall (pt was recommended to tell her MD about it).  Bowel problems since taking medications (was constapated, but after taking medications, pt had diarrhea which was difficult to control. Getting out of the metformin fixed the bowel issue).  Pt states tingling and numbness L lateral LE along the L5 dermatome.  Denies saddle anesthesia.  Pt states that her back problem is mainly on her L side.  Pt landed on her L knee when she fell.  No other falls within the last 6 months. Pt also states being very dizzy since her L knee surgery.  Had PT treatement for her dizziness before which did not help.  The room feels like it is spinning when she gets dizzy.  I feel like it is getting better then she does something.  5/10 low back soreness currently (7/10 at most for the past 7 days after negotiating stairs, 5/10 without doing stairs). 4/10 L knee pain currenglty with gait 5/10 L knee pain at most for the past 7 days.    Patient Stated Goals  Be better able to get into and out of her vehicle (midsized truck), into and out of chairs, be better able to roll in her bed with less L knee pain. Be able to get down on the floor and get back up.     Currently in Pain?  Yes   no pain level provided.   Pain Onset  More than a month ago         [facility] PT Assessment - 11/04/18 0001      Assessment   Referring Provider (PT)  Paralee Cancel, MD                           PT Education - 11/04/18 1302    Education Details  ther-ex    Person(s) Educated  Patient    Methods  Explanation;Demonstration;Tactile cues;Verbal cues    Comprehension  Returned demonstration;Verbalized understanding        Objectives   MedbridgeAccess Code: [email or web address]  Latex free bands  usedif used   Sitting posture: L trunk rotation  Standing posture: slightLlateral shift  Pt states that pressing her back against the chair feels better.  Gait observation: increased L lateral lean during L LE stance phase.     Therapeutic exercise  Emphasized doing HEP to help with back pain when back is bothering her to help decrease back pain.   stair navigation B rails x7 reciprocal pattern.              Emphasis on femoral control, cues provided. Difficulty with femoral control L > R but improved with cues and practice  single leg dead lift with one UE assist 10x3             L 10x3             R 10x2. L knee joint discomfort at end of 2nd set.    Standing L lateral shift correction 10x2 with 5 second holds   Decreased low back pain    L knee flexion stretch at stair step (or 6 inch step) to promote knee flexion AROM for getting into and out of a tub. R UE assist 10x3 with 5 second holds   Reviewed and given as part of her HEP. Handout provided.   Seated thoracic extension over chair with 6 inch step foot prop 3x5 seconds  Still has low back pressure   Seated B open books 10x5 seconds to promote thoracic extension   Seated chin tucks with scapular retraction to promote throracic extension and decrease low back extension pressure 10x5 seconds   decreased neck pain   Reviewed plan of care: continue with 1x/week for 6 weeks    Improved exercise technique, movement at target joints, use of target muscles after min to mod verbal, visual, tactile cues.      Response to treatment Decreased neck pain, and improved low back flexibility reported by pt after session  Clinical impression Continued working on thoracic extension and improving posture to help decrease low back pain. Also worked on scapular muscle use to help decrease neck pain. Worked on L knee flexion ROM as well to promote ability to step into and out of the bath tub with  less difficulty. Overall, back pain seem to improve with exercises to promote glute, med, max, and trunk muscle activation as well as thoracic extension. L knee pain seem to improve with treatment to promote femoral control and decreasing lateral tissue tightness. Challenges to progress include chronicity of condition, age, and multiple problem areas. Pt will benefit from continued skilled physical therapy services to decrease pain, improve L knee flexion ROM, and promote pt ability to get into and out of her bath tub (pt personal goal) as well as perform functional tasks with less difficulty.         PT Short Term Goals - 12/12/17 0930      PT SHORT TERM GOAL #1   Title  Patient will be independent with her HEP to help decrease back and L knee pain and improve ability to perform functional tasks.     Time  3    Period  Weeks    Status  Achieved    Target Date  12/12/17        PT Long Term Goals - 11/04/18 1706      PT LONG TERM GOAL #1   Title  Patient will have a decrease in back pain to 3/10 or less at worst to promote ability to ambulate, turn in bed, perform standing tasks with less pain.  Baseline  6/10 back pain at most (09/24/2017); 7-8/10 at most for the past 7 days, duration of pain is better since starting PT (10/29/2017); 5-6/10 at most for the past 7 days (11/18/2017); 8/10 at most for the past 7 days but the duration of pain is less compared to prior to starting PT (12/12/2017).  7/10 back pain at most for the past 7 days (pt states doing her exercises helps decrease her pain; 01/02/2018); walking (3/10), standing (4/10), bed mobility (0/10) 02/05/2018; 6/10 back pain at most for the past 7 days (03/03/2018); 6.5/10 back pain at most for the past 7 days (03/27/2018); 7-8/10 at most (05/08/2018); 7/10 back pain at worst (07/22/2018);  7-8/10 at most for the past 7 days (09/22/2018); 5-7/10 at worst for the past 7 days then calms down (10/27/2018)    Time  6    Period  Weeks    Status   On-going    Target Date  12/18/18      PT LONG TERM GOAL #2   Title  Patient will have a decrease in L knee pain to 3/10 or less at worst to promote ability to ambulate, negotiate stairs, get into and out of a car more comfortably.     Baseline  6/10 L knee pain at most for the past 3 months (09/24/2017); 7-8/10 at most for the past 7 days, duration of pain is better since starting PT (10/29/2017); 7/10 at most for the past 7 days (11/18/2017); 6/10 L knee pain at most for the past 7 days (12/12/2017); 5/10 L knee pain at most for the past 7 days (pt states doing her exercises helps decrease pain; 01/02/2018); L knee 3/10 (02/05/2018); 4/10 at most for the past 7 days (03/03/2018); 5.5/10 L knee at most for the past 7 days (03/27/2018); 6-7/10 L knee pain at most (05/08/2018); 7/10 L knee pain at worst for the past month (07/22/2018);  6/10 L knee pain at most for the past 7 days (09/22/2018); 5-6/10 at most for the past 7 days sporadically, can get it better with exercise (10/27/2018)    Time  6    Period  Weeks    Status  On-going    Target Date  12/18/18      PT LONG TERM GOAL #3   Title  Patient will improve TUG time to 12 seconds or less as a demonstration of improved functional mobility and balance.     Baseline  TUG no AD: 16.05 seconds on average (09/24/2017); 14.3 seconds on average (10/29/2017); 13 seconds average (11/21/2017); 16.67 seconds average (12/12/2017);  14.17 seconds average (01/02/2018); 11.8 sec sonds (02/05/2018)    Time  6    Period  Weeks    Status  Achieved      PT LONG TERM GOAL #4   Title  Patient will improve her back FOTO score by at least 10 points as a demonstration of improved funtion.      Baseline  Back FOTO: 33 (09/24/2017); 38 (10/29/2017); 36 (11/21/2017); 40 (12/12/2017); 42 (01/02/2018); 47 (03/03/2018)    Time  6    Period  Weeks    Status  Achieved      PT LONG TERM GOAL #5   Title  Patient will improve bilateral LE strength by at least 1/2 MMT grade to promote ability to  perform standing tasks.     Time  6    Period  Weeks    Status  Achieved      PT LONG  TERM GOAL #6   Title  Patient will improve her Modified Oswestry Low Back pain disablity questionnaire by at least 10% as a demonstration of improved function.     Baseline  48% (09/24/2017); 46% (10/29/2017), (11/21/2017); 56% (12/12/2017); 36% (01/02/2018), (03/03/2018)    Time  6    Period  Weeks    Status  Achieved      PT LONG TERM GOAL #7   Title  Pt will report decreased difficulty stepping into and out of her bathtub as well as getting into and out of her truck to promote ability to get into and out of places.     Baseline  Pt reports difficulty stepping into and out of her bathtub as well as difficulty getting into and out of her truck (01/02/2018); Pt states the truck and bath tub and difficult and painful.  (02/05/2018); Improving per subjective reports (03/03/2018); decreased difficulty with truck tranfser after cues for technique and practice, decreasing difficulty with stepping into and out of the bath tub based on subjective reports (03/27/2018 and 04/01/2018); Decreasing difficulty stepping into a bathtub. No difficulty getting into and out of her truck (05/08/2018), (07/22/2018); Difficulty stepping into and out of the bath tub. No difficulty with truck transfers (09/22/2018); Able to get into her bath tub, difficulty getting out (10/27/18)    Time  6    Period  Weeks    Status  Partially Met    Target Date  12/18/18      PT LONG TERM GOAL #8   Title  Patient will improve her BERG balance test score to 46/56 or more as a demonstration of decreased fall risk     Baseline  42/56 (01/02/2018); 54/56 (02/05/2018)    Time  6    Period  Weeks    Status  Achieved      PT LONG TERM GOAL  #9   TITLE  Pt will report minimal to no dizziness related to vertigo to promote balance and decrease fall risk.     Baseline  Pt reports dizziness (related to vertigo) which seem to affect her balance (01/15/2018); Patient  reports "a little bit every now and then" (02/05/2018)    Time  6    Period  Weeks    Status  Achieved            Plan - 11/04/18 1652    Clinical Impression Statement  Continued working on thoracic extension and improving posture to help decrease low back pain. Also worked on scapular muscle use to help decrease neck pain. Worked on L knee flexion ROM as well to promote ability to step into and out of the bath tub with less difficulty. Overall, back pain seem to improve with exercises to promote glute, med, max, and trunk muscle activation as well as thoracic extension. L knee pain seem to improve with treatment to promote femoral control and decreasing lateral tissue tightness. Challenges to progress include chronicity of condition, age, and multiple problem areas. Pt will benefit from continued skilled physical therapy services to decrease pain, improve L knee flexion ROM, and promote pt ability to get into and out of her bath tub (pt personal goal) as well as perform functional tasks with less difficulty.    Personal Factors and Comorbidities  Age;Comorbidity 3+;Time since onset of injury/illness/exacerbation;Fitness;Past/Current Experience    Comorbidities  Back, knee pain, hx of dizziness, hx of back surgery, hx of C-section    Examination-Activity Limitations  Squat    Stability/Clinical  Decision Making  Stable/Uncomplicated    Clinical Decision Making  Low    Clinical Presentation due to:  Pt overall making progress with PT towards goals.    Rehab Potential  Fair    Clinical Impairments Affecting Rehab Potential  difficulty with carry-over of improvements in clinic, chronicity of condition, age, multiple problem areas    PT Frequency  1x / week    PT Duration  6 weeks    PT Treatment/Interventions  Therapeutic activities;Therapeutic exercise;Balance training;Neuromuscular re-education;Patient/family education;Manual techniques;Dry needling;Aquatic Therapy;Electrical  Stimulation;Iontophoresis 7m/ml Dexamethasone;Gait training;Canalith Repostioning;Vestibular    PT Next Visit Plan   hip and knee strengthening, core strengthening, patellar mobility, manual techniques, modalities PRN    Consulted and Agree with Plan of Care  Patient       Patient will benefit from skilled therapeutic intervention in order to improve the following deficits and impairments:  Pain, Improper body mechanics, Postural dysfunction, Dizziness, Decreased strength, Difficulty walking, Decreased balance  Visit Diagnosis: Radiculopathy, lumbar region - Plan: PT plan of care cert/re-cert  Muscle weakness (generalized) - Plan: PT plan of care cert/re-cert  Chronic pain of left knee - Plan: PT plan of care cert/re-cert  History of falling - Plan: PT plan of care cert/re-cert  Dizziness and giddiness - Plan: PT plan of care cert/re-cert     Problem List Patient Active Problem List   Diagnosis Date Noted  . Obese 06/20/2016  . Status post right knee replacement 06/19/2016  . S/P left TKA 06/18/2016  . Spinal stenosis of lumbar region 09/14/2015    MJoneen BoersPT, DPT    11/04/2018, 5:13 PM  [facility] [address] [place] [address]Phone: [phone]  Fax:  [phone] Name: RAVARI NEVARESMRN: [id number] of Birth: [birth year]

## 2018-11-04 NOTE — Patient Instructions (Signed)
Access Code: [email or web address]  URL: [email or web address]  Date: 11/04/2018  Prepared by: Joneen Boers   Exercises Seated Hip Adduction Isometrics with Diona Foley - 10 reps - 2 sets - 5 seconds hold - 1x daily - 7x weekly Seated Gluteal Sets - 10 reps - 3 sets - 5 seconds hold - 1x daily - 7x weekly Standing Bilateral Gastroc Stretch with Step - 5 reps - 1 sets - 20 seconds hold - 2x daily - 7x weekly Seated Long Arc Quad with Ankle Weight - 10 reps - 3 sets - 5 seconds hold - 1x daily - 7x weekly Bent Knee Fallouts - 10 reps - 3 sets - 1x daily - 7x weekly Seated Posterior Pelvic Tilt - 10 reps - 3 sets - 1x daily - 7x weekly                  Seated Flexion Stretch with Swiss Ball - 10 reps - 3 sets - 5 seconds hold - 1x daily - 7x weekly Lower Trunk Rotations - 10 reps - 3 sets - 1x daily - 7x weekly Shoulder Adduction with Anchored Resistance - 10 reps - 2 sets - 5 seconds hold - 1x daily - 7x weekly Scapular Retraction with Resistance - 10 reps - 3 sets - 5 seconds hold - 1x daily - 7x weekly Seated Heel Slide - 10 reps - 3 sets - 5 seconds hold - 1x daily - 7x weekly Seated Cervical Retraction - 10 reps - 3 sets - 5 secibds hold - 1x daily - 7x weekly Gastroc Stretch on Wall - 5 reps - 3 sets - 15 seconds hold - 1x daily - 7x weekly Sidelying Hip Abduction - 8 reps - 3 sets - 1x daily - 7x weekly Seated Knee Flexion Extension AROM - 10 reps - 3 sets - 5 seconds hold - 3x daily - 7x weekly Standing Partial Lunge - 10 reps - 2 sets - 1x daily - 7x weekly Standing Knee Flexion Stretch on Step - 10 reps - 3 sets - 5 hold - 1x daily - 7x weekly

## 2018-11-10 ENCOUNTER — Ambulatory Visit: Payer: Medicare Other

## 2018-11-12 ENCOUNTER — Ambulatory Visit: Payer: Medicare Other

## 2018-11-19 ENCOUNTER — Ambulatory Visit: Payer: Medicare Other

## 2018-11-19 ENCOUNTER — Other Ambulatory Visit: Payer: Self-pay

## 2018-11-19 DIAGNOSIS — M5416 Radiculopathy, lumbar region: Secondary | ICD-10-CM | POA: Diagnosis not present

## 2018-11-19 DIAGNOSIS — M6281 Muscle weakness (generalized): Secondary | ICD-10-CM

## 2018-11-19 DIAGNOSIS — G8929 Other chronic pain: Secondary | ICD-10-CM

## 2018-11-19 DIAGNOSIS — Z9181 History of falling: Secondary | ICD-10-CM

## 2018-11-19 DIAGNOSIS — M25562 Pain in left knee: Secondary | ICD-10-CM

## 2018-11-19 NOTE — Therapy (Signed)
[facility] [address] [place], [address] Phone: [phone]   Fax:  [phone]  Physical Therapy Treatment  Patient Details  Name: Destiny Shaw MRN: [id number] Date of Birth: [birth year] Referring Provider (PT): Paralee Cancel, MD   Encounter Date: 11/19/2018  PT End of Session - 11/19/18 1551    Visit Number  52    Number of Visits  82    Date for PT Re-Evaluation  12/18/18    Authorization Type  2    Authorization Time Period  of 10 progress note Medicare    PT Start Time  8242    PT Stop Time  1636    PT Time Calculation (min)  45 min    Activity Tolerance  Patient tolerated treatment well    Behavior During Therapy  [facility] for tasks assessed/performed       Past Medical History:  Diagnosis Date  . ADD (attention deficit disorder)   . Anemia   . Anginal pain ([place])    pt states has occas chest pain relates to indigestion; pt uses rest to relieve   . Anxiety   . Arthritis   . Concussion   . Depression   . Diabetes mellitus without complication ([place])   . Dizziness   . Fall   . GERD (gastroesophageal reflux disease)   . Headache   . History of urinary tract infection   . Hyperlipidemia   . Hypertension   . Hypothyroidism   . IBS (irritable bowel syndrome)   . Imbalance   . Numbness    right leg   . Numbness in both hands    comes and goes   . Pneumonia    last episode approx 1 year ago   . Sleep apnea   . Wears glasses     Past Surgical History:  Procedure Laterality Date  . BREAST CYST ASPIRATION Left 1980's   neg  . BREAST CYST EXCISION Right 1980's   neg  . BREAST LUMPECTOMY Right   . BREAST SURGERY    . CARPAL TUNNEL RELEASE    . CESAREAN SECTION     times 2  . COLONOSCOPY WITH PROPOFOL N/A 05/11/2015   Procedure: COLONOSCOPY WITH PROPOFOL;  Surgeon: Manya Silvas, MD;  Location: [facility] ENDOSCOPY;  Service: Endoscopy;  Laterality: N/A;  . DE QUERVAIN'S RELEASE Right   .  ESOPHAGOGASTRODUODENOSCOPY (EGD) WITH PROPOFOL N/A 05/11/2015   Procedure: ESOPHAGOGASTRODUODENOSCOPY (EGD) WITH PROPOFOL;  Surgeon: Manya Silvas, MD;  Location: [facility] ENDOSCOPY;  Service: Endoscopy;  Laterality: N/A;  . EYE SURGERY     laser surgery bilat   . HERNIA REPAIR    . KNEE ARTHROSCOPY    . LUMBAR LAMINECTOMY/DECOMPRESSION MICRODISCECTOMY Bilateral 09/14/2015   Procedure: MICRO LUMBAR BILATERAL DECOMPRESSION L4 - L5;  Surgeon: Susa Day, MD;  Location: [facility] ORS;  Service: Orthopedics;  Laterality: Bilateral;  . REDUCTION MAMMAPLASTY Bilateral 1980  . RHINOPLASTY    . TONSILLECTOMY    . TOTAL KNEE ARTHROPLASTY Left 06/18/2016   Procedure: LEFT TOTAL KNEE ARTHROPLASTY;  Surgeon: Paralee Cancel, MD;  Location: [facility] ORS;  Service: Orthopedics;  Laterality: Left;  Adductor Block  . TUBAL LIGATION    . UVULOPALATOPHARYNGOPLASTY      There were no vitals filed for this visit.  Subjective Assessment - 11/19/18 1552    Subjective  Having back pain (5/10 currently), L knee ache 4.5/10 L knee pain when walking, 6-7/10 going up and down steps.  Going up and down steps bothers her L knee.  Feels like she has come a long way. Sometimes she feels like she hits a plateau. Feels like her quality of life has improved.    Pertinent History  Low back pain. Pt states having back surgery about 2-3 years ago. After she had her L TKA, her back started aggravating her due to her walking. Pt also fell about 4 weeks ago onto her L knee. Dr. Alvan Dame checked out her L knee. L knee was fine but has some inflammation.  Pt tries to keep it iced. Pt still recovering for her L TKA but the fall set her back.   Her L knee surgery was last year.  L knee still bothers her a lot.  Had surgery for her back before her L knee surgery. Was doing well until her L knee surgery. The fall made it worse. Feels debilitating. Also has a hard time getting into and out of the car mainly due to her L knee.   Pt fell due to her L knee bucking  on her. Pt was trying to pick something up from the side of the couch.  Also has a hard time getting up from the floor.  Pt states having bladder accidents since after her fall (pt was recommended to tell her MD about it).  Bowel problems since taking medications (was constapated, but after taking medications, pt had diarrhea which was difficult to control. Getting out of the metformin fixed the bowel issue).  Pt states tingling and numbness L lateral LE along the L5 dermatome.  Denies saddle anesthesia.  Pt states that her back problem is mainly on her L side.  Pt landed on her L knee when she fell.  No other falls within the last 6 months. Pt also states being very dizzy since her L knee surgery.  Had PT treatement for her dizziness before which did not help.  The room feels like it is spinning when she gets dizzy.  I feel like it is getting better then she does something.  5/10 low back soreness currently (7/10 at most for the past 7 days after negotiating stairs, 5/10 without doing stairs). 4/10 L knee pain currenglty with gait 5/10 L knee pain at most for the past 7 days.    Patient Stated Goals  Be better able to get into and out of her vehicle (midsized truck), into and out of chairs, be better able to roll in her bed with less L knee pain. Be able to get down on the floor and get back up.     Currently in Pain?  Yes    Pain Score  5     Pain Onset  More than a month ago                               PT Education - 11/19/18 1557    Education Details  ther-ex    Person(s) Educated  Patient    Methods  Explanation;Demonstration;Tactile cues;Verbal cues    Comprehension  Returned demonstration;Verbalized understanding      Objectives   MedbridgeAccess Code: [email or web address]  Latex free bands usedif used   Sitting posture: L trunk rotation  Standing posture: slightLlateral shift  Pt states that pressing her back against the chair feels better.  Gait  observation: increased L lateral lean during L LE stance phase.     Therapeutic exercise  Ascending and  descending 4 regular steps with B UE assist   Good femoral control but still demonstrates L knee (patellar) pain  Seated hip adduction ball and glute max squeeze 10x10 seconds for 2 sets   Running man with R UE assist   L LE 10x  Then L LE 10x2 with R foot on furniture slider  Slight decrease in L knee pain with stair negotiation   standing mini squats 10x4 no UE assist   Emphasis on mechanics   Decreased L knee pain with stairs  Side step squat with B UE assist 5 ft to the R and 5 ft to the L for 2 sets  Standing knee flexion stretch at 2nd stair step with B UE assist 10x3 L LE   Improved L knee flexion ROM observed  Decreased L knee pain with stair negotiation  Stair negotiation throughout session to assess effectiveness of treatment   Seated medial glide L patella grade 3 for 2 minutes, with knee in flexion followed by stair negotiation   Decreased L knee pain with stairs.     Improved exercise technique, movement at target joints, use of target muscles after mod verbal, visual, tactile cues.    Response to treatment Decreased L knee pain after treatment to promote increased L glute max and quadriceps strengthening as well as decreasing stiffness at her patella.   Clinical impression  Continued working on L glute max and quadriceps muscle strengthening as well as improving L knee flexion ROM and patellar mobility to help decrease L knee pain with stair negotiation. Improved comfort level with task after aforementioned treatment. Pt will benefit from continued skilled physical therapy services to decrease pain, improve strength and function.      PT Short Term Goals - 12/12/17 0930      PT SHORT TERM GOAL #1   Title  Patient will be independent with her HEP to help decrease back and L knee pain and improve ability to perform functional tasks.      Time  3    Period  Weeks    Status  Achieved    Target Date  12/12/17        PT Long Term Goals - 11/04/18 1706      PT LONG TERM GOAL #1   Title  Patient will have a decrease in back pain to 3/10 or less at worst to promote ability to ambulate, turn in bed, perform standing tasks with less pain.     Baseline  6/10 back pain at most (09/24/2017); 7-8/10 at most for the past 7 days, duration of pain is better since starting PT (10/29/2017); 5-6/10 at most for the past 7 days (11/18/2017); 8/10 at most for the past 7 days but the duration of pain is less compared to prior to starting PT (12/12/2017).  7/10 back pain at most for the past 7 days (pt states doing her exercises helps decrease her pain; 01/02/2018); walking (3/10), standing (4/10), bed mobility (0/10) 02/05/2018; 6/10 back pain at most for the past 7 days (03/03/2018); 6.5/10 back pain at most for the past 7 days (03/27/2018); 7-8/10 at most (05/08/2018); 7/10 back pain at worst (07/22/2018);  7-8/10 at most for the past 7 days (09/22/2018); 5-7/10 at worst for the past 7 days then calms down (10/27/2018)    Time  6    Period  Weeks    Status  On-going    Target Date  12/18/18      PT LONG TERM GOAL #2  Title  Patient will have a decrease in L knee pain to 3/10 or less at worst to promote ability to ambulate, negotiate stairs, get into and out of a car more comfortably.     Baseline  6/10 L knee pain at most for the past 3 months (09/24/2017); 7-8/10 at most for the past 7 days, duration of pain is better since starting PT (10/29/2017); 7/10 at most for the past 7 days (11/18/2017); 6/10 L knee pain at most for the past 7 days (12/12/2017); 5/10 L knee pain at most for the past 7 days (pt states doing her exercises helps decrease pain; 01/02/2018); L knee 3/10 (02/05/2018); 4/10 at most for the past 7 days (03/03/2018); 5.5/10 L knee at most for the past 7 days (03/27/2018); 6-7/10 L knee pain at most (05/08/2018); 7/10 L knee pain at worst for the past  month (07/22/2018);  6/10 L knee pain at most for the past 7 days (09/22/2018); 5-6/10 at most for the past 7 days sporadically, can get it better with exercise (10/27/2018)    Time  6    Period  Weeks    Status  On-going    Target Date  12/18/18      PT LONG TERM GOAL #3   Title  Patient will improve TUG time to 12 seconds or less as a demonstration of improved functional mobility and balance.     Baseline  TUG no AD: 16.05 seconds on average (09/24/2017); 14.3 seconds on average (10/29/2017); 13 seconds average (11/21/2017); 16.67 seconds average (12/12/2017);  14.17 seconds average (01/02/2018); 11.8 sec sonds (02/05/2018)    Time  6    Period  Weeks    Status  Achieved      PT LONG TERM GOAL #4   Title  Patient will improve her back FOTO score by at least 10 points as a demonstration of improved funtion.      Baseline  Back FOTO: 33 (09/24/2017); 38 (10/29/2017); 36 (11/21/2017); 40 (12/12/2017); 42 (01/02/2018); 47 (03/03/2018)    Time  6    Period  Weeks    Status  Achieved      PT LONG TERM GOAL #5   Title  Patient will improve bilateral LE strength by at least 1/2 MMT grade to promote ability to perform standing tasks.     Time  6    Period  Weeks    Status  Achieved      PT LONG TERM GOAL #6   Title  Patient will improve her Modified Oswestry Low Back pain disablity questionnaire by at least 10% as a demonstration of improved function.     Baseline  48% (09/24/2017); 46% (10/29/2017), (11/21/2017); 56% (12/12/2017); 36% (01/02/2018), (03/03/2018)    Time  6    Period  Weeks    Status  Achieved      PT LONG TERM GOAL #7   Title  Pt will report decreased difficulty stepping into and out of her bathtub as well as getting into and out of her truck to promote ability to get into and out of places.     Baseline  Pt reports difficulty stepping into and out of her bathtub as well as difficulty getting into and out of her truck (01/02/2018); Pt states the truck and bath tub and difficult and painful.   (02/05/2018); Improving per subjective reports (03/03/2018); decreased difficulty with truck tranfser after cues for technique and practice, decreasing difficulty with stepping into and out of the bath tub  based on subjective reports (03/27/2018 and 04/01/2018); Decreasing difficulty stepping into a bathtub. No difficulty getting into and out of her truck (05/08/2018), (07/22/2018); Difficulty stepping into and out of the bath tub. No difficulty with truck transfers (09/22/2018); Able to get into her bath tub, difficulty getting out (10/27/18)    Time  6    Period  Weeks    Status  Partially Met    Target Date  12/18/18      PT LONG TERM GOAL #8   Title  Patient will improve her BERG balance test score to 46/56 or more as a demonstration of decreased fall risk     Baseline  42/56 (01/02/2018); 54/56 (02/05/2018)    Time  6    Period  Weeks    Status  Achieved      PT LONG TERM GOAL  #9   TITLE  Pt will report minimal to no dizziness related to vertigo to promote balance and decrease fall risk.     Baseline  Pt reports dizziness (related to vertigo) which seem to affect her balance (01/15/2018); Patient reports "a little bit every now and then" (02/05/2018)    Time  6    Period  Weeks    Status  Achieved            Plan - 11/19/18 1558    Clinical Impression Statement  Continued working on L glute max and quadriceps muscle strengthening as well as improving L knee flexion ROM and patellar mobility to help decrease L knee pain with stair negotiation. Improved comfort level with task after aforementioned treatment. Pt will benefit from continued skilled physical therapy services to decrease pain, improve strength and function.    Personal Factors and Comorbidities  Age;Comorbidity 3+;Time since onset of injury/illness/exacerbation;Fitness;Past/Current Experience    Comorbidities  Back, knee pain, hx of dizziness, hx of back surgery, hx of C-section    Examination-Activity Limitations  Squat     Stability/Clinical Decision Making  Stable/Uncomplicated    Rehab Potential  Fair    Clinical Impairments Affecting Rehab Potential  difficulty with carry-over of improvements in clinic, chronicity of condition, age, multiple problem areas    PT Frequency  1x / week    PT Duration  6 weeks    PT Treatment/Interventions  Therapeutic activities;Therapeutic exercise;Balance training;Neuromuscular re-education;Patient/family education;Manual techniques;Dry needling;Aquatic Therapy;Electrical Stimulation;Iontophoresis 66m/ml Dexamethasone;Gait training;Canalith Repostioning;Vestibular    PT Next Visit Plan   hip and knee strengthening, core strengthening, patellar mobility, manual techniques, modalities PRN    Consulted and Agree with Plan of Care  Patient       Patient will benefit from skilled therapeutic intervention in order to improve the following deficits and impairments:  Pain, Improper body mechanics, Postural dysfunction, Dizziness, Decreased strength, Difficulty walking, Decreased balance  Visit Diagnosis: Chronic pain of left knee  History of falling  Muscle weakness (generalized)  Radiculopathy, lumbar region     Problem List Patient Active Problem List   Diagnosis Date Noted  . Obese 06/20/2016  . Status post right knee replacement 06/19/2016  . S/P left TKA 06/18/2016  . Spinal stenosis of lumbar region 09/14/2015    MJoneen BoersPT, DPT   11/19/2018, 4:49 PM  [facility] [address] [place] [address]Phone: [phone]  Fax:  [phone] Name: RSHYLOH DEROSAMRN: [id number] of Birth: 4Mar 08, 1952

## 2018-11-19 NOTE — Patient Instructions (Signed)
Access Code: [email or web address]  URL: [email or web address]  Date: 11/19/2018  Prepared by: Joneen Boers   Exercises Seated Hip Adduction Isometrics with Diona Foley - 10 reps - 2 sets - 5 seconds hold - 1x daily - 7x weekly Seated Gluteal Sets - 10 reps - 3 sets - 5 seconds hold - 1x daily - 7x weekly Standing Bilateral Gastroc Stretch with Step - 5 reps - 1 sets - 20 seconds hold - 2x daily - 7x weekly Seated Long Arc Quad with Ankle Weight - 10 reps - 3 sets - 5 seconds hold - 1x daily - 7x weekly Bent Knee Fallouts - 10 reps - 3 sets - 1x daily - 7x weekly Seated Posterior Pelvic Tilt - 10 reps - 3 sets - 1x daily - 7x weekly                  Seated Flexion Stretch with Swiss Ball - 10 reps - 3 sets - 5 seconds hold - 1x daily - 7x weekly Lower Trunk Rotations - 10 reps - 3 sets - 1x daily - 7x weekly Shoulder Adduction with Anchored Resistance - 10 reps - 2 sets - 5 seconds hold - 1x daily - 7x weekly Scapular Retraction with Resistance - 10 reps - 3 sets - 5 seconds hold - 1x daily - 7x weekly Seated Heel Slide - 10 reps - 3 sets - 5 seconds hold - 1x daily - 7x weekly Seated Cervical Retraction - 10 reps - 3 sets - 5 secibds hold - 1x daily - 7x weekly Gastroc Stretch on Wall - 5 reps - 3 sets - 15 seconds hold - 1x daily - 7x weekly Sidelying Hip Abduction - 8 reps - 3 sets - 1x daily - 7x weekly Seated Knee Flexion Extension AROM - 10 reps - 3 sets - 5 seconds hold - 3x daily - 7x weekly Standing Partial Lunge - 10 reps - 2 sets - 1x daily - 7x weekly Standing Knee Flexion Stretch on Step - 10 reps - 2 sets - 5 hold - 1x daily - 7x weekly

## 2018-11-26 ENCOUNTER — Ambulatory Visit: Payer: Medicare Other

## 2018-12-02 ENCOUNTER — Other Ambulatory Visit: Payer: Self-pay

## 2018-12-02 ENCOUNTER — Ambulatory Visit: Payer: Medicare Other | Attending: Orthopedic Surgery

## 2018-12-02 DIAGNOSIS — R42 Dizziness and giddiness: Secondary | ICD-10-CM | POA: Diagnosis present

## 2018-12-02 DIAGNOSIS — G8929 Other chronic pain: Secondary | ICD-10-CM | POA: Insufficient documentation

## 2018-12-02 DIAGNOSIS — M6281 Muscle weakness (generalized): Secondary | ICD-10-CM | POA: Diagnosis present

## 2018-12-02 DIAGNOSIS — M25562 Pain in left knee: Secondary | ICD-10-CM | POA: Diagnosis present

## 2018-12-02 DIAGNOSIS — M5416 Radiculopathy, lumbar region: Secondary | ICD-10-CM

## 2018-12-02 DIAGNOSIS — Z9181 History of falling: Secondary | ICD-10-CM | POA: Diagnosis present

## 2018-12-02 NOTE — Therapy (Signed)
[facility] [address] [place], [address] Phone: [phone]   Fax:  [phone]  Physical Therapy Treatment  Patient Details  Name: Destiny Shaw MRN: [id number] Date of Birth: 30-Jun-1950 Referring Provider (PT): Paralee Cancel, MD   Encounter Date: 12/02/2018  PT End of Session - 12/02/18 1038    Visit Number  53    Number of Visits  82    Date for PT Re-Evaluation  12/18/18    Authorization Type  3    Authorization Time Period  of 10 progress note Medicare    PT Start Time  1038    PT Stop Time  1120    PT Time Calculation (min)  42 min    Activity Tolerance  Patient tolerated treatment well    Behavior During Therapy  [facility] for tasks assessed/performed       Past Medical History:  Diagnosis Date  . ADD (attention deficit disorder)   . Anemia   . Anginal pain ([place])    pt states has occas chest pain relates to indigestion; pt uses rest to relieve   . Anxiety   . Arthritis   . Concussion   . Depression   . Diabetes mellitus without complication ([place])   . Dizziness   . Fall   . GERD (gastroesophageal reflux disease)   . Headache   . History of urinary tract infection   . Hyperlipidemia   . Hypertension   . Hypothyroidism   . IBS (irritable bowel syndrome)   . Imbalance   . Numbness    right leg   . Numbness in both hands    comes and goes   . Pneumonia    last episode approx 1 year ago   . Sleep apnea   . Wears glasses     Past Surgical History:  Procedure Laterality Date  . BREAST CYST ASPIRATION Left 1980's   neg  . BREAST CYST EXCISION Right 1980's   neg  . BREAST LUMPECTOMY Right   . BREAST SURGERY    . CARPAL TUNNEL RELEASE    . CESAREAN SECTION     times 2  . COLONOSCOPY WITH PROPOFOL N/A 05/11/2015   Procedure: COLONOSCOPY WITH PROPOFOL;  Surgeon: Manya Silvas, MD;  Location: [facility] ENDOSCOPY;  Service: Endoscopy;  Laterality: N/A;  . DE QUERVAIN'S RELEASE Right   .  ESOPHAGOGASTRODUODENOSCOPY (EGD) WITH PROPOFOL N/A 05/11/2015   Procedure: ESOPHAGOGASTRODUODENOSCOPY (EGD) WITH PROPOFOL;  Surgeon: Manya Silvas, MD;  Location: [facility] ENDOSCOPY;  Service: Endoscopy;  Laterality: N/A;  . EYE SURGERY     laser surgery bilat   . HERNIA REPAIR    . KNEE ARTHROSCOPY    . LUMBAR LAMINECTOMY/DECOMPRESSION MICRODISCECTOMY Bilateral 09/14/2015   Procedure: MICRO LUMBAR BILATERAL DECOMPRESSION L4 - L5;  Surgeon: Susa Day, MD;  Location: [facility] ORS;  Service: Orthopedics;  Laterality: Bilateral;  . REDUCTION MAMMAPLASTY Bilateral 1980  . RHINOPLASTY    . TONSILLECTOMY    . TOTAL KNEE ARTHROPLASTY Left 06/18/2016   Procedure: LEFT TOTAL KNEE ARTHROPLASTY;  Surgeon: Paralee Cancel, MD;  Location: [facility] ORS;  Service: Orthopedics;  Laterality: Left;  Adductor Block  . TUBAL LIGATION    . UVULOPALATOPHARYNGOPLASTY      There were no vitals filed for this visit.  Subjective Assessment - 12/02/18 1039    Subjective  R knee is bothering her today. 9/10 R knee pain with gait, 9/10 with with going down stairs. Bending her  knee bothers her R knee. L knee is doing pretty good. 6/10 L knee pain with gait. 6/10 back pain currently.    Pertinent History  Low back pain. Pt states having back surgery about 2-3 years ago. After she had her L TKA, her back started aggravating her due to her walking. Pt also fell about 4 weeks ago onto her L knee. Dr. Alvan Dame checked out her L knee. L knee was fine but has some inflammation.  Pt tries to keep it iced. Pt still recovering for her L TKA but the fall set her back.   Her L knee surgery was last year.  L knee still bothers her a lot.  Had surgery for her back before her L knee surgery. Was doing well until her L knee surgery. The fall made it worse. Feels debilitating. Also has a hard time getting into and out of the car mainly due to her L knee.   Pt fell due to her L knee bucking on her. Pt was trying to pick something up from the side of the couch.   Also has a hard time getting up from the floor.  Pt states having bladder accidents since after her fall (pt was recommended to tell her MD about it).  Bowel problems since taking medications (was constapated, but after taking medications, pt had diarrhea which was difficult to control. Getting out of the metformin fixed the bowel issue).  Pt states tingling and numbness L lateral LE along the L5 dermatome.  Denies saddle anesthesia.  Pt states that her back problem is mainly on her L side.  Pt landed on her L knee when she fell.  No other falls within the last 6 months. Pt also states being very dizzy since her L knee surgery.  Had PT treatement for her dizziness before which did not help.  The room feels like it is spinning when she gets dizzy.  I feel like it is getting better then she does something.  5/10 low back soreness currently (7/10 at most for the past 7 days after negotiating stairs, 5/10 without doing stairs). 4/10 L knee pain currenglty with gait 5/10 L knee pain at most for the past 7 days.    Patient Stated Goals  Be better able to get into and out of her vehicle (midsized truck), into and out of chairs, be better able to roll in her bed with less L knee pain. Be able to get down on the floor and get back up.     Currently in Pain?  Yes    Pain Score  9    R knee pain   Pain Onset  More than a month ago                               PT Education - 12/02/18 1418    Education Details  Continue to perform HEP, take a break if exercises bother her knee.    Person(s) Educated  Patient    Methods  Explanation    Comprehension  Verbalized understanding        Objectives   MedbridgeAccess Code: [email or web address]  Latex free bands usedif used   Sitting posture: L trunk rotation  Standing posture: slightLlateral shift  Pt states that pressing her back against the chair feels better.  Gait observation: increased L lateral lean during L LE stance  phase.    Worked R knee pain to  help pt perform back and L knee exercises.   R knee pain located at joint line as well as medial and lateral leg.      Manual therapy  Supine gentle R femoral IR grade 1 to 2+ for pain control   Then grade 3-      Seated medial glide R patella grade 3  Seated myfascial release R lateral knee  Seated STM R lateral hamstrings   Decreased R knee pain with gait    Therapeutic exercise  Seated hip adduction ball and glute max squeeze 10x10 seconds   Slight decrease in knee pain with gait.   Improved exercise technique, movement at target joints, use of target muscles after min to mod verbal, visual, tactile cues.    Time spent listening to pt subjective pertaining to R knee, L knee, and low back.    Response to treatment Decreased R knee pain with gait after session  Clinical impression  Pt arrived today with recent onset of R knee pain. Worked on decreasing R knee pain today to promote ability to ambulate more comfortably and to be able to perform standing exercises for her L knee and low back pain rehab. Performed gentle joint mobilizations to R knee for pain control and promote joint nutrition as well as promote better medial glide of her R patella with knee flexion and extension movements. TTP medial > lateral joint line at tibial surface. Decreased R knee pain and improved ability to ambulate after manual therapy. Still demonstrates R knee discomfort during weight bearing and swing phase of gait but improved compared to start of session. Pt will benefit from continued skilled physical therapy services to decrease pain, improve strength, function, and ability to ambulate more comfortably.           PT Short Term Goals - 12/12/17 0930      PT SHORT TERM GOAL #1   Title  Patient will be independent with her HEP to help decrease back and L knee pain and improve ability to perform functional tasks.     Time  3    Period   Weeks    Status  Achieved    Target Date  12/12/17        PT Long Term Goals - 11/04/18 1706      PT LONG TERM GOAL #1   Title  Patient will have a decrease in back pain to 3/10 or less at worst to promote ability to ambulate, turn in bed, perform standing tasks with less pain.     Baseline  6/10 back pain at most (09/24/2017); 7-8/10 at most for the past 7 days, duration of pain is better since starting PT (10/29/2017); 5-6/10 at most for the past 7 days (11/18/2017); 8/10 at most for the past 7 days but the duration of pain is less compared to prior to starting PT (12/12/2017).  7/10 back pain at most for the past 7 days (pt states doing her exercises helps decrease her pain; 01/02/2018); walking (3/10), standing (4/10), bed mobility (0/10) 02/05/2018; 6/10 back pain at most for the past 7 days (03/03/2018); 6.5/10 back pain at most for the past 7 days (03/27/2018); 7-8/10 at most (05/08/2018); 7/10 back pain at worst (07/22/2018);  7-8/10 at most for the past 7 days (09/22/2018); 5-7/10 at worst for the past 7 days then calms down (10/27/2018)    Time  6    Period  Weeks    Status  On-going    Target Date  12/18/18      PT LONG TERM GOAL #2   Title  Patient will have a decrease in L knee pain to 3/10 or less at worst to promote ability to ambulate, negotiate stairs, get into and out of a car more comfortably.     Baseline  6/10 L knee pain at most for the past 3 months (09/24/2017); 7-8/10 at most for the past 7 days, duration of pain is better since starting PT (10/29/2017); 7/10 at most for the past 7 days (11/18/2017); 6/10 L knee pain at most for the past 7 days (12/12/2017); 5/10 L knee pain at most for the past 7 days (pt states doing her exercises helps decrease pain; 01/02/2018); L knee 3/10 (02/05/2018); 4/10 at most for the past 7 days (03/03/2018); 5.5/10 L knee at most for the past 7 days (03/27/2018); 6-7/10 L knee pain at most (05/08/2018); 7/10 L knee pain at worst for the past month (07/22/2018);   6/10 L knee pain at most for the past 7 days (09/22/2018); 5-6/10 at most for the past 7 days sporadically, can get it better with exercise (10/27/2018)    Time  6    Period  Weeks    Status  On-going    Target Date  12/18/18      PT LONG TERM GOAL #3   Title  Patient will improve TUG time to 12 seconds or less as a demonstration of improved functional mobility and balance.     Baseline  TUG no AD: 16.05 seconds on average (09/24/2017); 14.3 seconds on average (10/29/2017); 13 seconds average (11/21/2017); 16.67 seconds average (12/12/2017);  14.17 seconds average (01/02/2018); 11.8 sec sonds (02/05/2018)    Time  6    Period  Weeks    Status  Achieved      PT LONG TERM GOAL #4   Title  Patient will improve her back FOTO score by at least 10 points as a demonstration of improved funtion.      Baseline  Back FOTO: 33 (09/24/2017); 38 (10/29/2017); 36 (11/21/2017); 40 (12/12/2017); 42 (01/02/2018); 47 (03/03/2018)    Time  6    Period  Weeks    Status  Achieved      PT LONG TERM GOAL #5   Title  Patient will improve bilateral LE strength by at least 1/2 MMT grade to promote ability to perform standing tasks.     Time  6    Period  Weeks    Status  Achieved      PT LONG TERM GOAL #6   Title  Patient will improve her Modified Oswestry Low Back pain disablity questionnaire by at least 10% as a demonstration of improved function.     Baseline  48% (09/24/2017); 46% (10/29/2017), (11/21/2017); 56% (12/12/2017); 36% (01/02/2018), (03/03/2018)    Time  6    Period  Weeks    Status  Achieved      PT LONG TERM GOAL #7   Title  Pt will report decreased difficulty stepping into and out of her bathtub as well as getting into and out of her truck to promote ability to get into and out of places.     Baseline  Pt reports difficulty stepping into and out of her bathtub as well as difficulty getting into and out of her truck (01/02/2018); Pt states the truck and bath tub and difficult and painful.  (02/05/2018);  Improving per subjective reports (03/03/2018); decreased difficulty with truck tranfser after cues for technique  and practice, decreasing difficulty with stepping into and out of the bath tub based on subjective reports (03/27/2018 and 04/01/2018); Decreasing difficulty stepping into a bathtub. No difficulty getting into and out of her truck (05/08/2018), (07/22/2018); Difficulty stepping into and out of the bath tub. No difficulty with truck transfers (09/22/2018); Able to get into her bath tub, difficulty getting out (10/27/18)    Time  6    Period  Weeks    Status  Partially Met    Target Date  12/18/18      PT LONG TERM GOAL #8   Title  Patient will improve her BERG balance test score to 46/56 or more as a demonstration of decreased fall risk     Baseline  42/56 (01/02/2018); 54/56 (02/05/2018)    Time  6    Period  Weeks    Status  Achieved      PT LONG TERM GOAL  #9   TITLE  Pt will report minimal to no dizziness related to vertigo to promote balance and decrease fall risk.     Baseline  Pt reports dizziness (related to vertigo) which seem to affect her balance (01/15/2018); Patient reports "a little bit every now and then" (02/05/2018)    Time  6    Period  Weeks    Status  Achieved            Plan - 12/02/18 1419    Clinical Impression Statement  Pt arrived today with recent onset of R knee pain. Worked on decreasing R knee pain today to promote ability to ambulate more comfortably and to be able to perform standing exercises for her L knee and low back pain rehab. Performed gentle joint mobilizations to R knee for pain control and promote joint nutrition as well as promote better medial glide of her R patella with knee flexion and extension movements. TTP medial > lateral joint line at tibial surface. Decreased R knee pain and improved ability to ambulate after manual therapy. Still demonstrates R knee discomfort during weight bearing and swing phase of gait but improved compared to start  of session. Pt will benefit from continued skilled physical therapy services to decrease pain, improve strength, function, and ability to ambulate more comfortably.    Personal Factors and Comorbidities  Age;Comorbidity 3+;Time since onset of injury/illness/exacerbation;Fitness;Past/Current Experience    Comorbidities  Back, knee pain, hx of dizziness, hx of back surgery, hx of C-section    Examination-Activity Limitations  Squat    Stability/Clinical Decision Making  Stable/Uncomplicated    Rehab Potential  Fair    Clinical Impairments Affecting Rehab Potential  difficulty with carry-over of improvements in clinic, chronicity of condition, age, multiple problem areas    PT Frequency  1x / week    PT Duration  6 weeks    PT Treatment/Interventions  Therapeutic activities;Therapeutic exercise;Balance training;Neuromuscular re-education;Patient/family education;Manual techniques;Dry needling;Aquatic Therapy;Electrical Stimulation;Iontophoresis 86m/ml Dexamethasone;Gait training;Canalith Repostioning;Vestibular    PT Next Visit Plan   hip and knee strengthening, core strengthening, patellar mobility, manual techniques, modalities PRN    Consulted and Agree with Plan of Care  Patient       Patient will benefit from skilled therapeutic intervention in order to improve the following deficits and impairments:  Pain, Improper body mechanics, Postural dysfunction, Dizziness, Decreased strength, Difficulty walking, Decreased balance  Visit Diagnosis: Chronic pain of left knee  History of falling  Muscle weakness (generalized)  Radiculopathy, lumbar region     Problem List Patient Active Problem List  Diagnosis Date Noted  . Obese 06/20/2016  . Status post right knee replacement 06/19/2016  . S/P left TKA 06/18/2016  . Spinal stenosis of lumbar region 09/14/2015    Thank you for your referral.  Joneen Boers PT, DPT   12/02/2018, 2:30 PM  [facility] [address] [place], [address] Phone: [phone]   Fax:  [phone]  Name: Destiny Shaw MRN: [id number] Date of Birth: [birth year]

## 2018-12-03 ENCOUNTER — Ambulatory Visit: Payer: Medicare Other

## 2018-12-10 ENCOUNTER — Ambulatory Visit: Payer: Medicare Other

## 2018-12-10 ENCOUNTER — Other Ambulatory Visit: Payer: Self-pay

## 2018-12-10 DIAGNOSIS — M25562 Pain in left knee: Secondary | ICD-10-CM

## 2018-12-10 DIAGNOSIS — M6281 Muscle weakness (generalized): Secondary | ICD-10-CM

## 2018-12-10 DIAGNOSIS — G8929 Other chronic pain: Secondary | ICD-10-CM

## 2018-12-10 DIAGNOSIS — M5416 Radiculopathy, lumbar region: Secondary | ICD-10-CM

## 2018-12-10 NOTE — Therapy (Signed)
[facility] [address], [place], [address] Phone: [phone]   Fax:  [phone]  Physical Therapy Treatment  Patient Details  Name: Destiny Shaw MRN: [id number] Date of Birth: 1951-01-29 Referring Provider (PT): Paralee Cancel, MD   Encounter Date: 12/10/2018  PT End of Session - 12/10/18 1302    Visit Number  17    Number of Visits  82    Date for PT Re-Evaluation  12/18/18    Authorization Type  4    Authorization Time Period  of 10 progress note Medicare    PT Start Time  1302    PT Stop Time  1344    PT Time Calculation (min)  42 min    Activity Tolerance  Patient tolerated treatment well    Behavior During Therapy  [facility] for tasks assessed/performed       Past Medical History:  Diagnosis Date  . ADD (attention deficit disorder)   . Anemia   . Anginal pain ([place])    pt states has occas chest pain relates to indigestion; pt uses rest to relieve   . Anxiety   . Arthritis   . Concussion   . Depression   . Diabetes mellitus without complication ([place])   . Dizziness   . Fall   . GERD (gastroesophageal reflux disease)   . Headache   . History of urinary tract infection   . Hyperlipidemia   . Hypertension   . Hypothyroidism   . IBS (irritable bowel syndrome)   . Imbalance   . Numbness    right leg   . Numbness in both hands    comes and goes   . Pneumonia    last episode approx 1 year ago   . Sleep apnea   . Wears glasses     Past Surgical History:  Procedure Laterality Date  . BREAST CYST ASPIRATION Left 1980's   neg  . BREAST CYST EXCISION Right 1980's   neg  . BREAST LUMPECTOMY Right   . BREAST SURGERY    . CARPAL TUNNEL RELEASE    . CESAREAN SECTION     times 2  . COLONOSCOPY WITH PROPOFOL N/A 05/11/2015   Procedure: COLONOSCOPY WITH PROPOFOL;  Surgeon: Manya Silvas, MD;  Location[facility] ENDOSCOPY;  Service: Endoscopy;  Laterality: N/A;  . DE QUERVAIN'S RELEASE Right   .  ESOPHAGOGASTRODUODENOSCOPY (EGD) WITH PROPOFOL N/A 05/11/2015   Procedure: ESOPHAGOGASTRODUODENOSCOPY (EGD) WITH PROPOFOL;  Surgeon: Manya Silvas, MD;  Location: [facility] ENDOSCOPY;  Service: Endoscopy;  Laterality: N/A;  . EYE SURGERY     laser surgery bilat   . HERNIA REPAIR    . KNEE ARTHROSCOPY    . LUMBAR LAMINECTOMY/DECOMPRESSION MICRODISCECTOMY Bilateral 09/14/2015   Procedure: MICRO LUMBAR BILATERAL DECOMPRESSION L4 - L5;  Surgeon: Susa Day, MD;  Location: [facility] ORS;  Service: Orthopedics;  Laterality: Bilateral;  . REDUCTION MAMMAPLASTY Bilateral 1980  . RHINOPLASTY    . TONSILLECTOMY    . TOTAL KNEE ARTHROPLASTY Left 06/18/2016   Procedure: LEFT TOTAL KNEE ARTHROPLASTY;  Surgeon: Paralee Cancel, MD;  Location: [facility] ORS;  Service: Orthopedics;  Laterality: Left;  Adductor Block  . TUBAL LIGATION    . UVULOPALATOPHARYNGOPLASTY      There were no vitals filed for this visit.  Subjective Assessment - 12/10/18 1303    Subjective  R knee got an injection last week which feels better, Still sore. L knee feels stiff and tight, and a  little sore. Back is doing ok right now, feels better after using heat and ice. 5/10 L knee, 5.5/10 low back pain with gait.    Pertinent History  Low back pain. Pt states having back surgery about 2-3 years ago. After she had her L TKA, her back started aggravating her due to her walking. Pt also fell about 4 weeks ago onto her L knee. Dr. Alvan Dame checked out her L knee. L knee was fine but has some inflammation.  Pt tries to keep it iced. Pt still recovering for her L TKA but the fall set her back.   Her L knee surgery was last year.  L knee still bothers her a lot.  Had surgery for her back before her L knee surgery. Was doing well until her L knee surgery. The fall made it worse. Feels debilitating. Also has a hard time getting into and out of the car mainly due to her L knee.   Pt fell due to her L knee bucking on her. Pt was trying to pick something up from the side  of the couch.  Also has a hard time getting up from the floor.  Pt states having bladder accidents since after her fall (pt was recommended to tell her MD about it).  Bowel problems since taking medications (was constapated, but after taking medications, pt had diarrhea which was difficult to control. Getting out of the metformin fixed the bowel issue).  Pt states tingling and numbness L lateral LE along the L5 dermatome.  Denies saddle anesthesia.  Pt states that her back problem is mainly on her L side.  Pt landed on her L knee when she fell.  No other falls within the last 6 months. Pt also states being very dizzy since her L knee surgery.  Had PT treatement for her dizziness before which did not help.  The room feels like it is spinning when she gets dizzy.  I feel like it is getting better then she does something.  5/10 low back soreness currently (7/10 at most for the past 7 days after negotiating stairs, 5/10 without doing stairs). 4/10 L knee pain currenglty with gait 5/10 L knee pain at most for the past 7 days.    Patient Stated Goals  Be better able to get into and out of her vehicle (midsized truck), into and out of chairs, be better able to roll in her bed with less L knee pain. Be able to get down on the floor and get back up.     Currently in Pain?  Yes    Pain Score  5     Pain Onset  More than a month ago                               PT Education - 12/10/18 1309    Education Details  ther-ex    [employer]) Educated  Patient    Methods  Explanation;Demonstration;Tactile cues;Verbal cues    Comprehension  Returned demonstration;Verbalized understanding      Objectives   MedbridgeAccess Code: [email or web address]  Latex free bands usedif used   Sitting posture: L trunk rotation  Standing posture: slightLlateral shift  Pt states that pressing her back against the chair feels better.  Gait observation: increased L lateral lean during L LE stance  phase.    Worked R knee pain to help pt perform back and L knee exercises.   R  knee pain located at joint line as well as medial and lateral leg.        Therapeutic exercise  Seated hip ER L 10x3   Seated manually resisted hip extension   R 10x3  L 10x3  Manually resisted clamshell isometrics 10x3 with 5 second holds   Seated hip adduction ball and glute max squeeze 10x10 seconds   Side stepping 32 ft to the R and 32 ft to the L   Forward wedding march 32 ft x 2.   Unsteady at times when on R LE. Pt states R knee was bothering her.      Improved exercise technique, movement at target joints, use of target muscles after min to mod verbal, visual, tactile cues.    Manual therapy  Seated STM R lateral hamstrings  Decreased R knee pain from 6 to 5/10 during gait   Reclined R tibial IR grade 3- for pain control and joint nutrition  Decreased R knee pain during gait.    Response to treatment Decreased R knee pain with gait after session  Clinical impression  Continued working on glute med and max strengthening to promote proper knee mechanics during standing activities. Also worked on decreasing R knee pain during closed chain activities to promote ability to perform standing exercises for her back and L knee. Decreased R knee pain with gait after manual therapy. Pt will benefit from continued skilled physical therapy services to decrease pain, improve strength, and function.      PT Short Term Goals - 12/12/17 0930      PT SHORT TERM GOAL #1   Title  Patient will be independent with her HEP to help decrease back and L knee pain and improve ability to perform functional tasks.     Time  3    Period  Weeks    Status  Achieved    Target Date  12/12/17        PT Long Term Goals - 11/04/18 1706      PT LONG TERM GOAL #1   Title  Patient will have a decrease in back pain to 3/10 or less at worst to promote ability to ambulate, turn in  bed, perform standing tasks with less pain.     Baseline  6/10 back pain at most (09/24/2017); 7-8/10 at most for the past 7 days, duration of pain is better since starting PT (10/29/2017); 5-6/10 at most for the past 7 days (11/18/2017); 8/10 at most for the past 7 days but the duration of pain is less compared to prior to starting PT (12/12/2017).  7/10 back pain at most for the past 7 days (pt states doing her exercises helps decrease her pain; 01/02/2018); walking (3/10), standing (4/10), bed mobility (0/10) 02/05/2018; 6/10 back pain at most for the past 7 days (03/03/2018); 6.5/10 back pain at most for the past 7 days (03/27/2018); 7-8/10 at most (05/08/2018); 7/10 back pain at worst (07/22/2018);  7-8/10 at most for the past 7 days (09/22/2018); 5-7/10 at worst for the past 7 days then calms down (10/27/2018)    Time  6    Period  Weeks    Status  On-going    Target Date  12/18/18      PT LONG TERM GOAL #2   Title  Patient will have a decrease in L knee pain to 3/10 or less at worst to promote ability to ambulate, negotiate stairs, get into and out of a car more comfortably.  Baseline  6/10 L knee pain at most for the past 3 months (09/24/2017); 7-8/10 at most for the past 7 days, duration of pain is better since starting PT (10/29/2017); 7/10 at most for the past 7 days (11/18/2017); 6/10 L knee pain at most for the past 7 days (12/12/2017); 5/10 L knee pain at most for the past 7 days (pt states doing her exercises helps decrease pain; 01/02/2018); L knee 3/10 (02/05/2018); 4/10 at most for the past 7 days (03/03/2018); 5.5/10 L knee at most for the past 7 days (03/27/2018); 6-7/10 L knee pain at most (05/08/2018); 7/10 L knee pain at worst for the past month (07/22/2018);  6/10 L knee pain at most for the past 7 days (09/22/2018); 5-6/10 at most for the past 7 days sporadically, can get it better with exercise (10/27/2018)    Time  6    Period  Weeks    Status  On-going    Target Date  12/18/18      PT LONG TERM  GOAL #3   Title  Patient will improve TUG time to 12 seconds or less as a demonstration of improved functional mobility and balance.     Baseline  TUG no AD: 16.05 seconds on average (09/24/2017); 14.3 seconds on average (10/29/2017); 13 seconds average (11/21/2017); 16.67 seconds average (12/12/2017);  14.17 seconds average (01/02/2018); 11.8 sec sonds (02/05/2018)    Time  6    Period  Weeks    Status  Achieved      PT LONG TERM GOAL #4   Title  Patient will improve her back FOTO score by at least 10 points as a demonstration of improved funtion.      Baseline  Back FOTO: 33 (09/24/2017); 38 (10/29/2017); 36 (11/21/2017); 40 (12/12/2017); 42 (01/02/2018); 47 (03/03/2018)    Time  6    Period  Weeks    Status  Achieved      PT LONG TERM GOAL #5   Title  Patient will improve bilateral LE strength by at least 1/2 MMT grade to promote ability to perform standing tasks.     Time  6    Period  Weeks    Status  Achieved      PT LONG TERM GOAL #6   Title  Patient will improve her Modified Oswestry Low Back pain disablity questionnaire by at least 10% as a demonstration of improved function.     Baseline  48% (09/24/2017); 46% (10/29/2017), (11/21/2017); 56% (12/12/2017); 36% (01/02/2018), (03/03/2018)    Time  6    Period  Weeks    Status  Achieved      PT LONG TERM GOAL #7   Title  Pt will report decreased difficulty stepping into and out of her bathtub as well as getting into and out of her truck to promote ability to get into and out of places.     Baseline  Pt reports difficulty stepping into and out of her bathtub as well as difficulty getting into and out of her truck (01/02/2018); Pt states the truck and bath tub and difficult and painful.  (02/05/2018); Improving per subjective reports (03/03/2018); decreased difficulty with truck tranfser after cues for technique and practice, decreasing difficulty with stepping into and out of the bath tub based on subjective reports (03/27/2018 and 04/01/2018); Decreasing  difficulty stepping into a bathtub. No difficulty getting into and out of her truck (05/08/2018), (07/22/2018); Difficulty stepping into and out of the bath tub. No difficulty with truck  transfers (09/22/2018); Able to get into her bath tub, difficulty getting out (10/27/18)    Time  6    Period  Weeks    Status  Partially Met    Target Date  12/18/18      PT LONG TERM GOAL #8   Title  Patient will improve her BERG balance test score to 46/56 or more as a demonstration of decreased fall risk     Baseline  42/56 (01/02/2018); 54/56 (02/05/2018)    Time  6    Period  Weeks    Status  Achieved      PT LONG TERM GOAL  #9   TITLE  Pt will report minimal to no dizziness related to vertigo to promote balance and decrease fall risk.     Baseline  Pt reports dizziness (related to vertigo) which seem to affect her balance (01/15/2018); Patient reports "a little bit every now and then" (02/05/2018)    Time  6    Period  Weeks    Status  Achieved            Plan - 12/10/18 1309    Clinical Impression Statement  Continued working on glute med and max strengthening to promote proper knee mechanics during standing activities. Also worked on decreasing R knee pain during closed chain activities to promote ability to perform standing exercises for her back and L knee. Decreased R knee pain with gait after manual therapy. Pt will benefit from continued skilled physical therapy services to decrease pain, improve strength, and function.    Personal Factors and Comorbidities  Age;Comorbidity 3+;Time since onset of injury/illness/exacerbation;Fitness;Past/Current Experience    Comorbidities  Back, knee pain, hx of dizziness, hx of back surgery, hx of C-section    Examination-Activity Limitations  Squat    Stability/Clinical Decision Making  Stable/Uncomplicated    Rehab Potential  Fair    Clinical Impairments Affecting Rehab Potential  difficulty with carry-over of improvements in clinic, chronicity of  condition, age, multiple problem areas    PT Frequency  1x / week    PT Duration  6 weeks    PT Treatment/Interventions  Therapeutic activities;Therapeutic exercise;Balance training;Neuromuscular re-education;Patient/family education;Manual techniques;Dry needling;Aquatic Therapy;Electrical Stimulation;Iontophoresis 31m/ml Dexamethasone;Gait training;Canalith Repostioning;Vestibular    PT Next Visit Plan   hip and knee strengthening, core strengthening, patellar mobility, manual techniques, modalities PRN    Consulted and Agree with Plan of Care  Patient       Patient will benefit from skilled therapeutic intervention in order to improve the following deficits and impairments:  Pain, Improper body mechanics, Postural dysfunction, Dizziness, Decreased strength, Difficulty walking, Decreased balance  Visit Diagnosis: Chronic pain of left knee  Muscle weakness (generalized)  Radiculopathy, lumbar region     Problem List Patient Active Problem List   Diagnosis Date Noted  . Obese 06/20/2016  . Status post right knee replacement 06/19/2016  . S/P left TKA 06/18/2016  . Spinal stenosis of lumbar region 09/14/2015    MJoneen BoersPT, DPT   12/10/2018, 7:20 PM  [facility] [address] [place] [address]Phone: [phone]  Fax:  [phone] Name: Destiny MARTIREMRN: [id number] of Birth: 4July 08, 1952

## 2018-12-17 ENCOUNTER — Other Ambulatory Visit: Payer: Self-pay

## 2018-12-17 ENCOUNTER — Ambulatory Visit: Payer: Medicare Other

## 2018-12-17 DIAGNOSIS — M5416 Radiculopathy, lumbar region: Secondary | ICD-10-CM

## 2018-12-17 DIAGNOSIS — M25562 Pain in left knee: Secondary | ICD-10-CM

## 2018-12-17 DIAGNOSIS — R42 Dizziness and giddiness: Secondary | ICD-10-CM

## 2018-12-17 DIAGNOSIS — G8929 Other chronic pain: Secondary | ICD-10-CM

## 2018-12-17 DIAGNOSIS — M6281 Muscle weakness (generalized): Secondary | ICD-10-CM

## 2018-12-17 DIAGNOSIS — Z9181 History of falling: Secondary | ICD-10-CM

## 2018-12-17 NOTE — Therapy (Signed)
[facility] [address], [place], [address] Phone: [phone]   Fax:  [phone]  Physical Therapy Treatment  Patient Details  Name: Destiny Shaw MRN: [id number] Date of Birth: 10-07-1950 Referring Provider (PT): Paralee Cancel, MD   Encounter Date: 12/17/2018  PT End of Session - 12/17/18 1302    Visit Number  55    Number of Visits  57    Date for PT Re-Evaluation  01/29/19    Authorization Type  5    Authorization Time Period  of 10 progress note Medicare    PT Start Time  1302    PT Stop Time  1402    PT Time Calculation (min)  60 min    Activity Tolerance  Patient tolerated treatment well    Behavior During Therapy  [facility] for tasks assessed/performed       Past Medical History:  Diagnosis Date  . ADD (attention deficit disorder)   . Anemia   . Anginal pain ([place])    pt states has occas chest pain relates to indigestion; pt uses rest to relieve   . Anxiety   . Arthritis   . Concussion   . Depression   . Diabetes mellitus without complication ([place])   . Dizziness   . Fall   . GERD (gastroesophageal reflux disease)   . Headache   . History of urinary tract infection   . Hyperlipidemia   . Hypertension   . Hypothyroidism   . IBS (irritable bowel syndrome)   . Imbalance   . Numbness    right leg   . Numbness in both hands    comes and goes   . Pneumonia    last episode approx 1 year ago   . Sleep apnea   . Wears glasses     Past Surgical History:  Procedure Laterality Date  . BREAST CYST ASPIRATION Left 1980's   neg  . BREAST CYST EXCISION Right 1980's   neg  . BREAST LUMPECTOMY Right   . BREAST SURGERY    . CARPAL TUNNEL RELEASE    . CESAREAN SECTION     times 2  . COLONOSCOPY WITH PROPOFOL N/A 05/11/2015   Procedure: COLONOSCOPY WITH PROPOFOL;  Surgeon: Manya Silvas, MD;  Location: [facility] ENDOSCOPY;  Service: Endoscopy;  Laterality: N/A;  . DE QUERVAIN'S RELEASE Right   .  ESOPHAGOGASTRODUODENOSCOPY (EGD) WITH PROPOFOL N/A 05/11/2015   Procedure: ESOPHAGOGASTRODUODENOSCOPY (EGD) WITH PROPOFOL;  Surgeon: Manya Silvas, MD;  Location: [facility] ENDOSCOPY;  Service: Endoscopy;  Laterality: N/A;  . EYE SURGERY     laser surgery bilat   . HERNIA REPAIR    . KNEE ARTHROSCOPY    . LUMBAR LAMINECTOMY/DECOMPRESSION MICRODISCECTOMY Bilateral 09/14/2015   Procedure: MICRO LUMBAR BILATERAL DECOMPRESSION L4 - L5;  Surgeon: Susa Day, MD;  Location: [facility] ORS;  Service: Orthopedics;  Laterality: Bilateral;  . REDUCTION MAMMAPLASTY Bilateral 1980  . RHINOPLASTY    . TONSILLECTOMY    . TOTAL KNEE ARTHROPLASTY Left 06/18/2016   Procedure: LEFT TOTAL KNEE ARTHROPLASTY;  Surgeon: Paralee Cancel, MD;  Location: [facility] ORS;  Service: Orthopedics;  Laterality: Left;  Adductor Block  . TUBAL LIGATION    . UVULOPALATOPHARYNGOPLASTY      There were no vitals filed for this visit.  Subjective Assessment - 12/17/18 1303    Subjective  R knee (medial knee and distal medial thigh) has been sensitive. L knee still feels real tight, not as sore  as R knee. L knee Still gets uncomfortable. Back hurts a lot.  Had 2 falls this past week. First fall was when pt was just bending down. Might have gotten dizzy. Had a hard time getting up. The second fall, pt was dizzy. Pt turned around to the L to look at something and fell. Has not yet done the vertigo exercises afterwards.    Pertinent History  Low back pain. Pt states having back surgery about 2-3 years ago. After she had her L TKA, her back started aggravating her due to her walking. Pt also fell about 4 weeks ago onto her L knee. Dr. Alvan Dame checked out her L knee. L knee was fine but has some inflammation.  Pt tries to keep it iced. Pt still recovering for her L TKA but the fall set her back.   Her L knee surgery was last year.  L knee still bothers her a lot.  Had surgery for her back before her L knee surgery. Was doing well until her L knee surgery. The  fall made it worse. Feels debilitating. Also has a hard time getting into and out of the car mainly due to her L knee.   Pt fell due to her L knee bucking on her. Pt was trying to pick something up from the side of the couch.  Also has a hard time getting up from the floor.  Pt states having bladder accidents since after her fall (pt was recommended to tell her MD about it).  Bowel problems since taking medications (was constapated, but after taking medications, pt had diarrhea which was difficult to control. Getting out of the metformin fixed the bowel issue).  Pt states tingling and numbness L lateral LE along the L5 dermatome.  Denies saddle anesthesia.  Pt states that her back problem is mainly on her L side.  Pt landed on her L knee when she fell.  No other falls within the last 6 months. Pt also states being very dizzy since her L knee surgery.  Had PT treatement for her dizziness before which did not help.  The room feels like it is spinning when she gets dizzy.  I feel like it is getting better then she does something.  5/10 low back soreness currently (7/10 at most for the past 7 days after negotiating stairs, 5/10 without doing stairs). 4/10 L knee pain currenglty with gait 5/10 L knee pain at most for the past 7 days.    Patient Stated Goals  Be better able to get into and out of her vehicle (midsized truck), into and out of chairs, be better able to roll in her bed with less L knee pain. Be able to get down on the floor and get back up.     Currently in Pain?  Yes    Pain Onset  More than a month ago                               PT Education - 12/17/18 1846    Education Details  plan of care    Person(s) Educated  Patient    Methods  Explanation;Demonstration;Tactile cues;Verbal cues    Comprehension  Returned demonstration;Verbalized understanding         Objectives   MedbridgeAccess Code: [email or web address]  Latex free bands usedif used   Sitting posture:  L trunk rotation  Standing posture: slightLlateral shift  Pt states that pressing her  back against the chair feels better.  Gait observation: increased L lateral lean during L LE stance phase.    Worked R knee pain to help pt perform back and L knee exercises.  R knee pain located at joint line as well as medial and lateral leg.        Therapeutic exercise  Manual L lateral shift correction. Decreased R low back pain.    Dix Hallpike  L (-)   R (-) but pt sees double vision   Inner feeling of spinning when getting up   Sit <> supine: vision goes up and down the further she looks to the L when sitting  Standing 360 degree turn   To the L 6 seconds. Inner feeling of dizziness  To the R 6 seconds. Room started moving when pt stopped. Pt lost her balance.    Seated manually resisted scapular retractoin targeting the lower trap muscle   R 10x5 seconds for 3 sets   Decreased dizziness with 360 degree R turn    L 10x5 seconds for 3 sets   Decreased dizziness with R and L 360 degree turn    Standing 360 degree turns as well   R 3x  L 3x  Decreased dizziness afterwards after lower trap muscle strengthening  Possible neck stiffness association with dizziness   Reviewed plan of care: 2x/week for 6 weeks  Improved exercise technique, movement at target joints, use of target muscles after mod verbal, visual, tactile cues.    Manual therapy  Seated STM B cervical paraspinal muscles R > L Seated STM B upper trap muscles R > L to decrease tension   No blurry vison with R and L cervical rotation     Response to treatment Decreased R kneepain with gait after session  Clinical impression  Pt demonstrates overall improved ability to perform functional tasks and improved overall balance with physical therapy. Difficulty decreasing low back and L knee pain in which chronicity of condition, age and pt tendency to not perform her HEP at  times may play a factor. Pt also recently experienced 2 falls in which dizziness may play a factor. Pt demonstrates loss of balance when performing 360 degree turns to the R and L in which treatment to decrease muscle tension around her neck and improving lower trap strengthening decreased symptoms when turning around both directions. Pt will benefit from continued skilled PT services to continue overall progress, improve strength, function, decrease pain and address set backs.      PT Short Term Goals - 12/12/17 0930      PT SHORT TERM GOAL #1   Title  Patient will be independent with her HEP to help decrease back and L knee pain and improve ability to perform functional tasks.     Time  3    Period  Weeks    Status  Achieved    Target Date  12/12/17        PT Long Term Goals - 12/17/18 1313      PT LONG TERM GOAL #1   Title  Patient will have a decrease in back pain to 3/10 or less at worst to promote ability to ambulate, turn in bed, perform standing tasks with less pain.     Baseline  6/10 back pain at most (09/24/2017); 7-8/10 at most for the past 7 days, duration of pain is better since starting PT (10/29/2017); 5-6/10 at most for the past 7 days (11/18/2017); 8/10 at most for  the past 7 days but the duration of pain is less compared to prior to starting PT (12/12/2017).  7/10 back pain at most for the past 7 days (pt states doing her exercises helps decrease her pain; 01/02/2018); walking (3/10), standing (4/10), bed mobility (0/10) 02/05/2018; 6/10 back pain at most for the past 7 days (03/03/2018); 6.5/10 back pain at most for the past 7 days (03/27/2018); 7-8/10 at most (05/08/2018); 7/10 back pain at worst (07/22/2018);  7-8/10 at most for the past 7 days (09/22/2018); 5-7/10 at worst for the past 7 days then calms down (10/27/2018); 6/10 back pain at most for the past 7 days (when pt cooks and cleans up in kitchen; 12/17/2018)    Time  6    Period  Weeks    Status  On-going    Target Date   01/29/19      PT LONG TERM GOAL #2   Title  Patient will have a decrease in L knee pain to 3/10 or less at worst to promote ability to ambulate, negotiate stairs, get into and out of a car more comfortably.     Baseline  6/10 L knee pain at most for the past 3 months (09/24/2017); 7-8/10 at most for the past 7 days, duration of pain is better since starting PT (10/29/2017); 7/10 at most for the past 7 days (11/18/2017); 6/10 L knee pain at most for the past 7 days (12/12/2017); 5/10 L knee pain at most for the past 7 days (pt states doing her exercises helps decrease pain; 01/02/2018); L knee 3/10 (02/05/2018); 4/10 at most for the past 7 days (03/03/2018); 5.5/10 L knee at most for the past 7 days (03/27/2018); 6-7/10 L knee pain at most (05/08/2018); 7/10 L knee pain at worst for the past month (07/22/2018);  6/10 L knee pain at most for the past 7 days (09/22/2018); 5-6/10 at most for the past 7 days sporadically, can get it better with exercise (10/27/2018); 6/10 L knee ache at most for the past 7 days (12/17/2018)    Time  6    Period  Weeks    Status  On-going    Target Date  01/29/19      PT LONG TERM GOAL #3   Title  Patient will improve TUG time to 12 seconds or less as a demonstration of improved functional mobility and balance.     Baseline  TUG no AD: 16.05 seconds on average (09/24/2017); 14.3 seconds on average (10/29/2017); 13 seconds average (11/21/2017); 16.67 seconds average (12/12/2017);  14.17 seconds average (01/02/2018); 11.8 sec sonds (02/05/2018)    Time  6    Period  Weeks    Status  Achieved      PT LONG TERM GOAL #4   Title  Patient will improve her back FOTO score by at least 10 points as a demonstration of improved funtion.      Baseline  Back FOTO: 33 (09/24/2017); 38 (10/29/2017); 36 (11/21/2017); 40 (12/12/2017); 42 (01/02/2018); 47 (03/03/2018)    Time  6    Period  Weeks    Status  Achieved      PT LONG TERM GOAL #5   Title  Patient will improve bilateral LE strength by at least  1/2 MMT grade to promote ability to perform standing tasks.     Time  6    Period  Weeks    Status  Achieved      PT LONG TERM GOAL #6   Title  Patient will improve her Modified Oswestry Low Back pain disablity questionnaire by at least 10% as a demonstration of improved function.     Baseline  48% (09/24/2017); 46% (10/29/2017), (11/21/2017); 56% (12/12/2017); 36% (01/02/2018), (03/03/2018)    Time  6    Period  Weeks    Status  Achieved      PT LONG TERM GOAL #7   Title  Pt will report decreased difficulty stepping into and out of her bathtub as well as getting into and out of her truck to promote ability to get into and out of places.     Baseline  Pt reports difficulty stepping into and out of her bathtub as well as difficulty getting into and out of her truck (01/02/2018); Pt states the truck and bath tub and difficult and painful.  (02/05/2018); Improving per subjective reports (03/03/2018); decreased difficulty with truck tranfser after cues for technique and practice, decreasing difficulty with stepping into and out of the bath tub based on subjective reports (03/27/2018 and 04/01/2018); Decreasing difficulty stepping into a bathtub. No difficulty getting into and out of her truck (05/08/2018), (07/22/2018); Difficulty stepping into and out of the bath tub. No difficulty with truck transfers (09/22/2018); Able to get into her bath tub, difficulty getting out (10/27/18)    Time  6    Period  Weeks    Status  Partially Met    Target Date  01/29/19      PT LONG TERM GOAL #8   Title  Patient will improve her BERG balance test score to 46/56 or more as a demonstration of decreased fall risk     Baseline  42/56 (01/02/2018); 54/56 (02/05/2018)    Time  6    Period  Weeks    Status  Achieved      PT LONG TERM GOAL  #9   TITLE  Pt will report minimal to no dizziness related to vertigo to promote balance and decrease fall risk.     Baseline  Pt reports dizziness (related to vertigo) which seem to affect  her balance (01/15/2018); Patient reports "a little bit every now and then" (02/05/2018)    Time  6    Period  Weeks    Status  Achieved      PT LONG TERM GOAL  #10   TITLE  Pt will be able to turn 360 degrees to the R and L at least 3 times without dizziness and no LOB to promote safety and decrease fall risk.    Baseline  360 degree turn to the R and L causes a loss of balance (12/17/2018)    Time  6    Period  Weeks    Status  New    Target Date  01/29/19            Plan - 12/17/18 1257    Clinical Impression Statement  Pt demonstrates overall improved ability to perform functional tasks and improved overall balance with physical therapy. Difficulty decreasing low back and L knee pain in which chronicity of condition, age and pt tendency to not perform her HEP at times may play a factor. Pt also recently experienced 2 falls in which dizziness may play a factor. Pt demonstrates loss of balance when performing 360 degree turns to the R and L in which treatment to decrease muscle tension around her neck and improving lower trap strengthening decreased symptoms when turning around both directions. Pt will benefit from continued skilled PT services to continue  overall progress, improve strength, function, decrease pain and address set backs.    Personal Factors and Comorbidities  Age;Comorbidity 3+;Time since onset of injury/illness/exacerbation;Fitness;Past/Current Experience    Comorbidities  Back, knee pain, hx of dizziness, hx of back surgery, hx of C-section    Examination-Activity Limitations  Squat    Stability/Clinical Decision Making  Stable/Uncomplicated    Clinical Decision Making  Moderate    Clinical Presentation due to:  recent history of falls, dizziness again may play a factor    Rehab Potential  Fair    Clinical Impairments Affecting Rehab Potential  difficulty with carry-over of improvements in clinic, chronicity of condition, age, multiple problem areas    PT Frequency   2x / week    PT Duration  6 weeks    PT Treatment/Interventions  Therapeutic activities;Therapeutic exercise;Balance training;Neuromuscular re-education;Patient/family education;Manual techniques;Dry needling;Aquatic Therapy;Electrical Stimulation;Iontophoresis 110m/ml Dexamethasone;Gait training;Canalith Repostioning;Vestibular    PT Next Visit Plan   hip and knee strengthening, core strengthening, patellar mobility, manual techniques, modalities PRN    Consulted and Agree with Plan of Care  Patient       Patient will benefit from skilled therapeutic intervention in order to improve the following deficits and impairments:  Pain, Improper body mechanics, Postural dysfunction, Dizziness, Decreased strength, Difficulty walking, Decreased balance  Visit Diagnosis: Chronic pain of left knee - Plan: PT plan of care cert/re-cert  Muscle weakness (generalized) - Plan: PT plan of care cert/re-cert  Radiculopathy, lumbar region - Plan: PT plan of care cert/re-cert  History of falling - Plan: PT plan of care cert/re-cert  Dizziness and giddiness - Plan: PT plan of care cert/re-cert     Problem List Patient Active Problem List   Diagnosis Date Noted  . Obese 06/20/2016  . Status post right knee replacement 06/19/2016  . S/P left TKA 06/18/2016  . Spinal stenosis of lumbar region 09/14/2015    MJoneen BoersPT, DPT   12/17/2018, 7:03 PM  [facility] [address] [place] [address]Phone: [phone]  Fax:  [phone] Name: Destiny FIFIELDMRN: [id number] of Birth: [birth year]

## 2018-12-24 ENCOUNTER — Ambulatory Visit: Payer: Medicare Other

## 2018-12-24 ENCOUNTER — Other Ambulatory Visit: Payer: Self-pay

## 2018-12-24 DIAGNOSIS — R42 Dizziness and giddiness: Secondary | ICD-10-CM

## 2018-12-24 DIAGNOSIS — M5416 Radiculopathy, lumbar region: Secondary | ICD-10-CM

## 2018-12-24 DIAGNOSIS — M6281 Muscle weakness (generalized): Secondary | ICD-10-CM

## 2018-12-24 DIAGNOSIS — M25562 Pain in left knee: Secondary | ICD-10-CM | POA: Diagnosis not present

## 2018-12-24 DIAGNOSIS — G8929 Other chronic pain: Secondary | ICD-10-CM

## 2018-12-24 DIAGNOSIS — Z9181 History of falling: Secondary | ICD-10-CM

## 2018-12-24 NOTE — Therapy (Signed)
[place] [facility] [address], [place], [address] Phone: [phone]   Fax:  [phone]  Physical Therapy Treatment  Patient Details  Name: Destiny Shaw MRN: [id number] Date of Birth: 07-Nov-1950 Referring Provider (PT): Paralee Cancel, MD   Encounter Date: 12/24/2018  PT End of Session - 12/24/18 1303    Visit Number  60    Number of Visits  44    Date for PT Re-Evaluation  01/29/19    Authorization Type  6    Authorization Time Period  of 10 progress note Medicare    PT Start Time  1303    PT Stop Time  1352    PT Time Calculation (min)  49 min    Activity Tolerance  Patient tolerated treatment well    Behavior During Therapy  [facility] for tasks assessed/performed       Past Medical History:  Diagnosis Date  . ADD (attention deficit disorder)   . Anemia   . Anginal pain ([place])    pt states has occas chest pain relates to indigestion; pt uses rest to relieve   . Anxiety   . Arthritis   . Concussion   . Depression   . Diabetes mellitus without complication ([place])   . Dizziness   . Fall   . GERD (gastroesophageal reflux disease)   . Headache   . History of urinary tract infection   . Hyperlipidemia   . Hypertension   . Hypothyroidism   . IBS (irritable bowel syndrome)   . Imbalance   . Numbness    right leg   . Numbness in both hands    comes and goes   . Pneumonia    last episode approx 1 year ago   . Sleep apnea   . Wears glasses     Past Surgical History:  Procedure Laterality Date  . BREAST CYST ASPIRATION Left 1980's   neg  . BREAST CYST EXCISION Right 1980's   neg  . BREAST LUMPECTOMY Right   . BREAST SURGERY    . CARPAL TUNNEL RELEASE    . CESAREAN SECTION     times 2  . COLONOSCOPY WITH PROPOFOL N/A 05/11/2015   Procedure: COLONOSCOPY WITH PROPOFOL;  Surgeon: Manya Silvas, MD;  Location: [facility] ENDOSCOPY;  Service: Endoscopy;  Laterality: N/A;  . DE QUERVAIN'S RELEASE Right   .  ESOPHAGOGASTRODUODENOSCOPY (EGD) WITH PROPOFOL N/A 05/11/2015   Procedure: ESOPHAGOGASTRODUODENOSCOPY (EGD) WITH PROPOFOL;  Surgeon: Manya Silvas, MD;  Location: [facility] ENDOSCOPY;  Service: Endoscopy;  Laterality: N/A;  . EYE SURGERY     laser surgery bilat   . HERNIA REPAIR    . KNEE ARTHROSCOPY    . LUMBAR LAMINECTOMY/DECOMPRESSION MICRODISCECTOMY Bilateral 09/14/2015   Procedure: MICRO LUMBAR BILATERAL DECOMPRESSION L4 - L5;  Surgeon: Susa Day, MD;  Location: [facility] ORS;  Service: Orthopedics;  Laterality: Bilateral;  . REDUCTION MAMMAPLASTY Bilateral 1980  . RHINOPLASTY    . TONSILLECTOMY    . TOTAL KNEE ARTHROPLASTY Left 06/18/2016   Procedure: LEFT TOTAL KNEE ARTHROPLASTY;  Surgeon: Paralee Cancel, MD;  Location: [facility] ORS;  Service: Orthopedics;  Laterality: Left;  Adductor Block  . TUBAL LIGATION    . UVULOPALATOPHARYNGOPLASTY      There were no vitals filed for this visit.  Subjective Assessment - 12/24/18 1304    Subjective  Not too bad today. R medial knee pain > L. No back pain currently. No pain L knee, just tightness.  5/10 R knee pain currently. Dizziness is not so good. Dizziness comes real bad when she turns suddenly.    Pertinent History  Low back pain. Pt states having back surgery about 2-3 years ago. After she had her L TKA, her back started aggravating her due to her walking. Pt also fell about 4 weeks ago onto her L knee. Dr. Alvan Dame checked out her L knee. L knee was fine but has some inflammation.  Pt tries to keep it iced. Pt still recovering for her L TKA but the fall set her back.   Her L knee surgery was last year.  L knee still bothers her a lot.  Had surgery for her back before her L knee surgery. Was doing well until her L knee surgery. The fall made it worse. Feels debilitating. Also has a hard time getting into and out of the car mainly due to her L knee.   Pt fell due to her L knee bucking on her. Pt was trying to pick something up from the side of the couch.  Also has  a hard time getting up from the floor.  Pt states having bladder accidents since after her fall (pt was recommended to tell her MD about it).  Bowel problems since taking medications (was constapated, but after taking medications, pt had diarrhea which was difficult to control. Getting out of the metformin fixed the bowel issue).  Pt states tingling and numbness L lateral LE along the L5 dermatome.  Denies saddle anesthesia.  Pt states that her back problem is mainly on her L side.  Pt landed on her L knee when she fell.  No other falls within the last 6 months. Pt also states being very dizzy since her L knee surgery.  Had PT treatement for her dizziness before which did not help.  The room feels like it is spinning when she gets dizzy.  I feel like it is getting better then she does something.  5/10 low back soreness currently (7/10 at most for the past 7 days after negotiating stairs, 5/10 without doing stairs). 4/10 L knee pain currenglty with gait 5/10 L knee pain at most for the past 7 days.    Patient Stated Goals  Be better able to get into and out of her vehicle (midsized truck), into and out of chairs, be better able to roll in her bed with less L knee pain. Be able to get down on the floor and get back up.     Currently in Pain?  Yes    Pain Score  5    R knee.   Pain Onset  More than a month ago                               PT Education - 12/24/18 2038    Education Details  ther-ex    Person(s) Educated  Patient    Methods  Explanation;Demonstration;Tactile cues;Verbal cues    Comprehension  Returned demonstration;Verbalized understanding      Objectives   MedbridgeAccess Code: [email or web address]  Latex free bands usedif used   Sitting posture: L trunk rotation  Standing posture: slightLlateral shift  Pt states that pressing her back against the chair feels better.  Gait observation: increased L lateral lean during L LE stance  phase.    Worked R knee pain to help pt perform back and L knee exercises.  R knee pain located at  joint line as well as medial and lateral leg.        Therapeutic exercise   Standing 360 degree turns to the R and to the L multiple times to assess effectiveness of treatment            Seated manually resisted scapular retraction targeting the lower trap muscle              R 10x5 seconds for 3 sets                                   L 10x5 seconds for 3 sets                         No change in unsteadiness with 360 degree turns today. Unsteadiness feels internal, no room spinning seen per pt.    seated chin tucks 10x5 seconds for 3 sets  Decreased unsteadiness with 360 degree turns R and L  Seated cervical nodding 10x3    Decreased unsteadiness with 360 degree turns L > R  Seated hip adduction ball and glute max squeeze 10x2 with 10 second holds   Decreased dizziness with 360 degree turns.    Check blood pressure in sitting and standing next visit if appropriate.     Improved exercise technique, movement at target joints, use of target muscles after mod verbal, visual, tactile cues.    Manual therapy  Seated STM B cervical paraspinal muscles R > L Seated STM B upper trap muscles R > L to decrease tension    Decreased unsteadiness with L 360 degree turns      Response to treatment Pt tolerated session well without aggravation of symptoms.    Clinical impression Worked on decreasing posterior cervical muscle tension, as well as activating anterior cervical muscles and promoting neck movement. Slight decrease in unsteadiness with 360 degree turns. No room spinning seen reported, unsteadiness feels internal reported.  Decreased unsteadiness with 360 degree turns after treatment to decrease cervical and upper trap muscle tension, activation of scapular muscles, and use of hip and trunk muscles. Pt will benefit from continued  skilled physical therapy services to improve balance, decrease fall risk, back and knee pain, and improve function.    PT Short Term Goals - 12/12/17 0930      PT SHORT TERM GOAL #1   Title  Patient will be independent with her HEP to help decrease back and L knee pain and improve ability to perform functional tasks.     Time  3    Period  Weeks    Status  Achieved    Target Date  12/12/17        PT Long Term Goals - 12/17/18 1313      PT LONG TERM GOAL #1   Title  Patient will have a decrease in back pain to 3/10 or less at worst to promote ability to ambulate, turn in bed, perform standing tasks with less pain.     Baseline  6/10 back pain at most (09/24/2017); 7-8/10 at most for the past 7 days, duration of pain is better since starting PT (10/29/2017); 5-6/10 at most for the past 7 days (11/18/2017); 8/10 at most for the past 7 days but the duration of pain is less compared to prior to starting PT (12/12/2017).  7/10 back pain at most for the past 7 days (pt states  doing her exercises helps decrease her pain; 01/02/2018); walking (3/10), standing (4/10), bed mobility (0/10) 02/05/2018; 6/10 back pain at most for the past 7 days (03/03/2018); 6.5/10 back pain at most for the past 7 days (03/27/2018); 7-8/10 at most (05/08/2018); 7/10 back pain at worst (07/22/2018);  7-8/10 at most for the past 7 days (09/22/2018); 5-7/10 at worst for the past 7 days then calms down (10/27/2018); 6/10 back pain at most for the past 7 days (when pt cooks and cleans up in kitchen; 12/17/2018)    Time  6    Period  Weeks    Status  On-going    Target Date  01/29/19      PT LONG TERM GOAL #2   Title  Patient will have a decrease in L knee pain to 3/10 or less at worst to promote ability to ambulate, negotiate stairs, get into and out of a car more comfortably.     Baseline  6/10 L knee pain at most for the past 3 months (09/24/2017); 7-8/10 at most for the past 7 days, duration of pain is better since starting PT  (10/29/2017); 7/10 at most for the past 7 days (11/18/2017); 6/10 L knee pain at most for the past 7 days (12/12/2017); 5/10 L knee pain at most for the past 7 days (pt states doing her exercises helps decrease pain; 01/02/2018); L knee 3/10 (02/05/2018); 4/10 at most for the past 7 days (03/03/2018); 5.5/10 L knee at most for the past 7 days (03/27/2018); 6-7/10 L knee pain at most (05/08/2018); 7/10 L knee pain at worst for the past month (07/22/2018);  6/10 L knee pain at most for the past 7 days (09/22/2018); 5-6/10 at most for the past 7 days sporadically, can get it better with exercise (10/27/2018); 6/10 L knee ache at most for the past 7 days (12/17/2018)    Time  6    Period  Weeks    Status  On-going    Target Date  01/29/19      PT LONG TERM GOAL #3   Title  Patient will improve TUG time to 12 seconds or less as a demonstration of improved functional mobility and balance.     Baseline  TUG no AD: 16.05 seconds on average (09/24/2017); 14.3 seconds on average (10/29/2017); 13 seconds average (11/21/2017); 16.67 seconds average (12/12/2017);  14.17 seconds average (01/02/2018); 11.8 sec sonds (02/05/2018)    Time  6    Period  Weeks    Status  Achieved      PT LONG TERM GOAL #4   Title  Patient will improve her back FOTO score by at least 10 points as a demonstration of improved funtion.      Baseline  Back FOTO: 33 (09/24/2017); 38 (10/29/2017); 36 (11/21/2017); 40 (12/12/2017); 42 (01/02/2018); 47 (03/03/2018)    Time  6    Period  Weeks    Status  Achieved      PT LONG TERM GOAL #5   Title  Patient will improve bilateral LE strength by at least 1/2 MMT grade to promote ability to perform standing tasks.     Time  6    Period  Weeks    Status  Achieved      PT LONG TERM GOAL #6   Title  Patient will improve her Modified Oswestry Low Back pain disablity questionnaire by at least 10% as a demonstration of improved function.     Baseline  48% (09/24/2017); 46% (10/29/2017), (11/21/2017);  56% (12/12/2017);  36% (01/02/2018), (03/03/2018)    Time  6    Period  Weeks    Status  Achieved      PT LONG TERM GOAL #7   Title  Pt will report decreased difficulty stepping into and out of her bathtub as well as getting into and out of her truck to promote ability to get into and out of places.     Baseline  Pt reports difficulty stepping into and out of her bathtub as well as difficulty getting into and out of her truck (01/02/2018); Pt states the truck and bath tub and difficult and painful.  (02/05/2018); Improving per subjective reports (03/03/2018); decreased difficulty with truck tranfser after cues for technique and practice, decreasing difficulty with stepping into and out of the bath tub based on subjective reports (03/27/2018 and 04/01/2018); Decreasing difficulty stepping into a bathtub. No difficulty getting into and out of her truck (05/08/2018), (07/22/2018); Difficulty stepping into and out of the bath tub. No difficulty with truck transfers (09/22/2018); Able to get into her bath tub, difficulty getting out (10/27/18)    Time  6    Period  Weeks    Status  Partially Met    Target Date  01/29/19      PT LONG TERM GOAL #8   Title  Patient will improve her BERG balance test score to 46/56 or more as a demonstration of decreased fall risk     Baseline  42/56 (01/02/2018); 54/56 (02/05/2018)    Time  6    Period  Weeks    Status  Achieved      PT LONG TERM GOAL  #9   TITLE  Pt will report minimal to no dizziness related to vertigo to promote balance and decrease fall risk.     Baseline  Pt reports dizziness (related to vertigo) which seem to affect her balance (01/15/2018); Patient reports "a little bit every now and then" (02/05/2018)    Time  6    Period  Weeks    Status  Achieved      PT LONG TERM GOAL  #10   TITLE  Pt will be able to turn 360 degrees to the R and L at least 3 times without dizziness and no LOB to promote safety and decrease fall risk.    Baseline  360 degree turn to the R and L  causes a loss of balance (12/17/2018)    Time  6    Period  Weeks    Status  New    Target Date  01/29/19            Plan - 12/24/18 1302    Clinical Impression Statement  Worked on decreasing posterior cervical muscle tension, as well as activating anterior cervical muscles and promoting neck movement. Slight decrease in unsteadiness with 360 degree turns. No room spinning seen reported, unsteadiness feels internal reported.   Decreased unsteadiness with 360 degree turns after treatment to decrease cervical and upper trap muscle tension, activation of scapular muscles, and use of hip and trunk muscles. Pt will benefit from continued skilled physical therapy services to improve balance, decrease fall risk, back and knee pain, and improve function.    Personal Factors and Comorbidities  Age;Comorbidity 3+;Time since onset of injury/illness/exacerbation;Fitness;Past/Current Experience    Comorbidities  Back, knee pain, hx of dizziness, hx of back surgery, hx of C-section    Examination-Activity Limitations  Squat    Stability/Clinical Decision Making  Stable/Uncomplicated  Rehab Potential  Fair    Clinical Impairments Affecting Rehab Potential  difficulty with carry-over of improvements in clinic, chronicity of condition, age, multiple problem areas    PT Frequency  2x / week    PT Duration  6 weeks    PT Treatment/Interventions  Therapeutic activities;Therapeutic exercise;Balance training;Neuromuscular re-education;Patient/family education;Manual techniques;Dry needling;Aquatic Therapy;Electrical Stimulation;Iontophoresis 64m/ml Dexamethasone;Gait training;Canalith Repostioning;Vestibular    PT Next Visit Plan   hip and knee strengthening, core strengthening, patellar mobility, manual techniques, modalities PRN    Consulted and Agree with Plan of Care  Patient       Patient will benefit from skilled therapeutic intervention in order to improve the following deficits and impairments:   Pain, Improper body mechanics, Postural dysfunction, Dizziness, Decreased strength, Difficulty walking, Decreased balance  Visit Diagnosis: Chronic pain of left knee  Muscle weakness (generalized)  Radiculopathy, lumbar region  History of falling  Dizziness and giddiness     Problem List Patient Active Problem List   Diagnosis Date Noted  . Obese 06/20/2016  . Status post right knee replacement 06/19/2016  . S/P left TKA 06/18/2016  . Spinal stenosis of lumbar region 09/14/2015    MJoneen BoersPT, DPT   12/24/2018, 8:46 PM  [facility] [address] [place] [address]Phone: [phone]  Fax:  [phone] Name: Destiny MIGLIACCIOMRN: [id number] of Birth: 405/21/1952

## 2018-12-29 ENCOUNTER — Ambulatory Visit: Payer: Medicare Other | Attending: Orthopedic Surgery

## 2018-12-29 DIAGNOSIS — M5416 Radiculopathy, lumbar region: Secondary | ICD-10-CM | POA: Insufficient documentation

## 2018-12-29 DIAGNOSIS — G8929 Other chronic pain: Secondary | ICD-10-CM | POA: Insufficient documentation

## 2018-12-29 DIAGNOSIS — M25562 Pain in left knee: Secondary | ICD-10-CM | POA: Insufficient documentation

## 2018-12-29 DIAGNOSIS — M6281 Muscle weakness (generalized): Secondary | ICD-10-CM | POA: Insufficient documentation

## 2018-12-29 DIAGNOSIS — Z9181 History of falling: Secondary | ICD-10-CM | POA: Insufficient documentation

## 2018-12-31 ENCOUNTER — Other Ambulatory Visit: Payer: Self-pay

## 2018-12-31 ENCOUNTER — Ambulatory Visit: Payer: Medicare Other

## 2018-12-31 DIAGNOSIS — M6281 Muscle weakness (generalized): Secondary | ICD-10-CM

## 2018-12-31 DIAGNOSIS — Z9181 History of falling: Secondary | ICD-10-CM | POA: Diagnosis present

## 2018-12-31 DIAGNOSIS — M25562 Pain in left knee: Secondary | ICD-10-CM

## 2018-12-31 DIAGNOSIS — G8929 Other chronic pain: Secondary | ICD-10-CM

## 2018-12-31 DIAGNOSIS — M5416 Radiculopathy, lumbar region: Secondary | ICD-10-CM | POA: Diagnosis present

## 2018-12-31 NOTE — Therapy (Signed)
[facility] [address] [place], [address] Phone: [phone]   Fax:  [phone]  Physical Therapy Treatment  Patient Details  Name: Destiny Shaw MRN: [id number] Date of Birth: [birth year] Referring Provider (PT): Paralee Cancel, MD   Encounter Date: 12/31/2018  PT End of Session - 12/31/18 1303    Visit Number  22    Number of Visits  69    Date for PT Re-Evaluation  01/29/19    Authorization Type  7    Authorization Time Period  of 10 progress note Medicare    PT Start Time  1303    PT Stop Time  1345    PT Time Calculation (min)  42 min    Activity Tolerance  Patient tolerated treatment well    Behavior During Therapy  [facility] for tasks assessed/performed       Past Medical History:  Diagnosis Date  . ADD (attention deficit disorder)   . Anemia   . Anginal pain ([place])    pt states has occas chest pain relates to indigestion; pt uses rest to relieve   . Anxiety   . Arthritis   . Concussion   . Depression   . Diabetes mellitus without complication ([place])   . Dizziness   . Fall   . GERD (gastroesophageal reflux disease)   . Headache   . History of urinary tract infection   . Hyperlipidemia   . Hypertension   . Hypothyroidism   . IBS (irritable bowel syndrome)   . Imbalance   . Numbness    right leg   . Numbness in both hands    comes and goes   . Pneumonia    last episode approx 1 year ago   . Sleep apnea   . Wears glasses     Past Surgical History:  Procedure Laterality Date  . BREAST CYST ASPIRATION Left 1980's   neg  . BREAST CYST EXCISION Right 1980's   neg  . BREAST LUMPECTOMY Right   . BREAST SURGERY    . CARPAL TUNNEL RELEASE    . CESAREAN SECTION     times 2  . COLONOSCOPY WITH PROPOFOL N/A 05/11/2015   Procedure: COLONOSCOPY WITH PROPOFOL;  Surgeon: Manya Silvas, MD;  Location: [facility] ENDOSCOPY;  Service: Endoscopy;  Laterality: N/A;  . DE QUERVAIN'S RELEASE Right   .  ESOPHAGOGASTRODUODENOSCOPY (EGD) WITH PROPOFOL N/A 05/11/2015   Procedure: ESOPHAGOGASTRODUODENOSCOPY (EGD) WITH PROPOFOL;  Surgeon: Manya Silvas, MD;  Location: [facility] ENDOSCOPY;  Service: Endoscopy;  Laterality: N/A;  . EYE SURGERY     laser surgery bilat   . HERNIA REPAIR    . KNEE ARTHROSCOPY    . LUMBAR LAMINECTOMY/DECOMPRESSION MICRODISCECTOMY Bilateral 09/14/2015   Procedure: MICRO LUMBAR BILATERAL DECOMPRESSION L4 - L5;  Surgeon: Susa Day, MD;  Location: [facility] ORS;  Service: Orthopedics;  Laterality: Bilateral;  . REDUCTION MAMMAPLASTY Bilateral 1980  . RHINOPLASTY    . TONSILLECTOMY    . TOTAL KNEE ARTHROPLASTY Left 06/18/2016   Procedure: LEFT TOTAL KNEE ARTHROPLASTY;  Surgeon: Paralee Cancel, MD;  Location: [facility] ORS;  Service: Orthopedics;  Laterality: Left;  Adductor Block  . TUBAL LIGATION    . UVULOPALATOPHARYNGOPLASTY      There were no vitals filed for this visit.  Subjective Assessment - 12/31/18 1304    Subjective  L knee feels like it is doing a lot better compared to her R knee. 8/10 R medial knee pain  currently when walking. 5/10 L knee stiffness. Back is about a 4-5/10 currently. The unsteadiness with turning is better. Still there but not like it was.    Pertinent History  Low back pain. Pt states having back surgery about 2-3 years ago. After she had her L TKA, her back started aggravating her due to her walking. Pt also fell about 4 weeks ago onto her L knee. Dr. Alvan Dame checked out her L knee. L knee was fine but has some inflammation.  Pt tries to keep it iced. Pt still recovering for her L TKA but the fall set her back.   Her L knee surgery was last year.  L knee still bothers her a lot.  Had surgery for her back before her L knee surgery. Was doing well until her L knee surgery. The fall made it worse. Feels debilitating. Also has a hard time getting into and out of the car mainly due to her L knee.   Pt fell due to her L knee bucking on her. Pt was trying to pick  something up from the side of the couch.  Also has a hard time getting up from the floor.  Pt states having bladder accidents since after her fall (pt was recommended to tell her MD about it).  Bowel problems since taking medications (was constapated, but after taking medications, pt had diarrhea which was difficult to control. Getting out of the metformin fixed the bowel issue).  Pt states tingling and numbness L lateral LE along the L5 dermatome.  Denies saddle anesthesia.  Pt states that her back problem is mainly on her L side.  Pt landed on her L knee when she fell.  No other falls within the last 6 months. Pt also states being very dizzy since her L knee surgery.  Had PT treatement for her dizziness before which did not help.  The room feels like it is spinning when she gets dizzy.  I feel like it is getting better then she does something.  5/10 low back soreness currently (7/10 at most for the past 7 days after negotiating stairs, 5/10 without doing stairs). 4/10 L knee pain currenglty with gait 5/10 L knee pain at most for the past 7 days.    Patient Stated Goals  Be better able to get into and out of her vehicle (midsized truck), into and out of chairs, be better able to roll in her bed with less L knee pain. Be able to get down on the floor and get back up.     Currently in Pain?  Yes    Pain Score  8    R medial knee pain.   Pain Onset  More than a month ago                               PT Education - 12/31/18 1939    Education Details  decreasing soft tissue tension    Person(s) Educated  Patient    Methods  Explanation    Comprehension  Verbalized understanding       Objectives   MedbridgeAccess Code: [email or web address]  Latex free bands usedif used   Sitting posture: L trunk rotation  Standing posture: slightLlateral shift  Pt states that pressing her back against the chair feels better.  Gait observation: increased L lateral lean during L LE  stance phase.    Worked R knee pain to help pt perform  back and L knee exercises.  R knee pain located at joint line as well as medial and lateral leg.          Supine hip IR at 90/90 position  Good IR R and L hip      Manual therapy   Supine R tibial IR grade 1 to 3- for pain control   Medial glide R patella to promote movement grade 3-   Decreased R medial knee ache to 5-6/10 during gait afterwards  Myofascial release R medial knee and distal thigh    Decreased R knee pain to 2-3/10 with gait afterwards   Myofascial release L medial knee and distal thigh   No L knee pain with gait after session        Response to treatment Pt tolerated session well without aggravation of symptoms.    Clinical impression Worked on decreasing soft tissue restrictions bilateral knees. Decreased bilateral knee pain with gait after session. Pt will benefit from continued skilled physical therapy services to decrease pain, improve strength, function, and decrease difficulty with gait.         PT Short Term Goals - 12/12/17 0930      PT SHORT TERM GOAL #1   Title  Patient will be independent with her HEP to help decrease back and L knee pain and improve ability to perform functional tasks.     Time  3    Period  Weeks    Status  Achieved    Target Date  12/12/17        PT Long Term Goals - 12/17/18 1313      PT LONG TERM GOAL #1   Title  Patient will have a decrease in back pain to 3/10 or less at worst to promote ability to ambulate, turn in bed, perform standing tasks with less pain.     Baseline  6/10 back pain at most (09/24/2017); 7-8/10 at most for the past 7 days, duration of pain is better since starting PT (10/29/2017); 5-6/10 at most for the past 7 days (11/18/2017); 8/10 at most for the past 7 days but the duration of pain is less compared to prior to starting PT (12/12/2017).  7/10 back pain at most for the past 7 days (pt  states doing her exercises helps decrease her pain; 01/02/2018); walking (3/10), standing (4/10), bed mobility (0/10) 02/05/2018; 6/10 back pain at most for the past 7 days (03/03/2018); 6.5/10 back pain at most for the past 7 days (03/27/2018); 7-8/10 at most (05/08/2018); 7/10 back pain at worst (07/22/2018);  7-8/10 at most for the past 7 days (09/22/2018); 5-7/10 at worst for the past 7 days then calms down (10/27/2018); 6/10 back pain at most for the past 7 days (when pt cooks and cleans up in kitchen; 12/17/2018)    Time  6    Period  Weeks    Status  On-going    Target Date  01/29/19      PT LONG TERM GOAL #2   Title  Patient will have a decrease in L knee pain to 3/10 or less at worst to promote ability to ambulate, negotiate stairs, get into and out of a car more comfortably.     Baseline  6/10 L knee pain at most for the past 3 months (09/24/2017); 7-8/10 at most for the past 7 days, duration of pain is better since starting PT (10/29/2017); 7/10 at most for the past 7 days (11/18/2017); 6/10 L knee pain at  most for the past 7 days (12/12/2017); 5/10 L knee pain at most for the past 7 days (pt states doing her exercises helps decrease pain; 01/02/2018); L knee 3/10 (02/05/2018); 4/10 at most for the past 7 days (03/03/2018); 5.5/10 L knee at most for the past 7 days (03/27/2018); 6-7/10 L knee pain at most (05/08/2018); 7/10 L knee pain at worst for the past month (07/22/2018);  6/10 L knee pain at most for the past 7 days (09/22/2018); 5-6/10 at most for the past 7 days sporadically, can get it better with exercise (10/27/2018); 6/10 L knee ache at most for the past 7 days (12/17/2018)    Time  6    Period  Weeks    Status  On-going    Target Date  01/29/19      PT LONG TERM GOAL #3   Title  Patient will improve TUG time to 12 seconds or less as a demonstration of improved functional mobility and balance.     Baseline  TUG no AD: 16.05 seconds on average (09/24/2017); 14.3 seconds on average (10/29/2017); 13  seconds average (11/21/2017); 16.67 seconds average (12/12/2017);  14.17 seconds average (01/02/2018); 11.8 sec sonds (02/05/2018)    Time  6    Period  Weeks    Status  Achieved      PT LONG TERM GOAL #4   Title  Patient will improve her back FOTO score by at least 10 points as a demonstration of improved funtion.      Baseline  Back FOTO: 33 (09/24/2017); 38 (10/29/2017); 36 (11/21/2017); 40 (12/12/2017); 42 (01/02/2018); 47 (03/03/2018)    Time  6    Period  Weeks    Status  Achieved      PT LONG TERM GOAL #5   Title  Patient will improve bilateral LE strength by at least 1/2 MMT grade to promote ability to perform standing tasks.     Time  6    Period  Weeks    Status  Achieved      PT LONG TERM GOAL #6   Title  Patient will improve her Modified Oswestry Low Back pain disablity questionnaire by at least 10% as a demonstration of improved function.     Baseline  48% (09/24/2017); 46% (10/29/2017), (11/21/2017); 56% (12/12/2017); 36% (01/02/2018), (03/03/2018)    Time  6    Period  Weeks    Status  Achieved      PT LONG TERM GOAL #7   Title  Pt will report decreased difficulty stepping into and out of her bathtub as well as getting into and out of her truck to promote ability to get into and out of places.     Baseline  Pt reports difficulty stepping into and out of her bathtub as well as difficulty getting into and out of her truck (01/02/2018); Pt states the truck and bath tub and difficult and painful.  (02/05/2018); Improving per subjective reports (03/03/2018); decreased difficulty with truck tranfser after cues for technique and practice, decreasing difficulty with stepping into and out of the bath tub based on subjective reports (03/27/2018 and 04/01/2018); Decreasing difficulty stepping into a bathtub. No difficulty getting into and out of her truck (05/08/2018), (07/22/2018); Difficulty stepping into and out of the bath tub. No difficulty with truck transfers (09/22/2018); Able to get into her bath tub,  difficulty getting out (10/27/18)    Time  6    Period  Weeks    Status  Partially Met  Target Date  01/29/19      PT LONG TERM GOAL #8   Title  Patient will improve her BERG balance test score to 46/56 or more as a demonstration of decreased fall risk     Baseline  42/56 (01/02/2018); 54/56 (02/05/2018)    Time  6    Period  Weeks    Status  Achieved      PT LONG TERM GOAL  #9   TITLE  Pt will report minimal to no dizziness related to vertigo to promote balance and decrease fall risk.     Baseline  Pt reports dizziness (related to vertigo) which seem to affect her balance (01/15/2018); Patient reports "a little bit every now and then" (02/05/2018)    Time  6    Period  Weeks    Status  Achieved      PT LONG TERM GOAL  #10   TITLE  Pt will be able to turn 360 degrees to the R and L at least 3 times without dizziness and no LOB to promote safety and decrease fall risk.    Baseline  360 degree turn to the R and L causes a loss of balance (12/17/2018)    Time  6    Period  Weeks    Status  New    Target Date  01/29/19            Plan - 12/31/18 1940    Clinical Impression Statement  Worked on decreasing soft tissue restrictions bilateral knees. Decreased bilateral knee pain with gait after session. Pt will benefit from continued skilled physical therapy services to decrease pain, improve strength, function, and decrease difficulty with gait.    Personal Factors and Comorbidities  Age;Comorbidity 3+;Time since onset of injury/illness/exacerbation;Fitness;Past/Current Experience    Comorbidities  Back, knee pain, hx of dizziness, hx of back surgery, hx of C-section    Examination-Activity Limitations  Squat    Stability/Clinical Decision Making  Stable/Uncomplicated    Rehab Potential  Fair    Clinical Impairments Affecting Rehab Potential  difficulty with carry-over of improvements in clinic, chronicity of condition, age, multiple problem areas    PT Frequency  2x / week    PT  Duration  6 weeks    PT Treatment/Interventions  Therapeutic activities;Therapeutic exercise;Balance training;Neuromuscular re-education;Patient/family education;Manual techniques;Dry needling;Aquatic Therapy;Electrical Stimulation;Iontophoresis 75m/ml Dexamethasone;Gait training;Canalith Repostioning;Vestibular    PT Next Visit Plan   hip and knee strengthening, core strengthening, patellar mobility, manual techniques, modalities PRN    Consulted and Agree with Plan of Care  Patient       Patient will benefit from skilled therapeutic intervention in order to improve the following deficits and impairments:  Pain, Improper body mechanics, Postural dysfunction, Dizziness, Decreased strength, Difficulty walking, Decreased balance  Visit Diagnosis: Chronic pain of left knee  Muscle weakness (generalized)     Problem List Patient Active Problem List   Diagnosis Date Noted  . Obese 06/20/2016  . Status post right knee replacement 06/19/2016  . S/P left TKA 06/18/2016  . Spinal stenosis of lumbar region 09/14/2015    MJoneen BoersPT, DPT   12/31/2018, 7:44 PM  [facility] [address] [place] [address]Phone: [phone]  Fax:  [phone] Name: RDAYSIA VANDENBOOMMRN: [id number] of Birth: 406-19-1952

## 2019-01-05 ENCOUNTER — Ambulatory Visit: Payer: Medicare Other

## 2019-01-06 ENCOUNTER — Ambulatory Visit: Payer: Medicare Other

## 2019-01-06 ENCOUNTER — Other Ambulatory Visit: Payer: Self-pay

## 2019-01-06 DIAGNOSIS — M25562 Pain in left knee: Secondary | ICD-10-CM

## 2019-01-06 DIAGNOSIS — G8929 Other chronic pain: Secondary | ICD-10-CM

## 2019-01-06 DIAGNOSIS — M6281 Muscle weakness (generalized): Secondary | ICD-10-CM

## 2019-01-06 DIAGNOSIS — Z9181 History of falling: Secondary | ICD-10-CM

## 2019-01-06 DIAGNOSIS — M5416 Radiculopathy, lumbar region: Secondary | ICD-10-CM

## 2019-01-06 NOTE — Therapy (Signed)
[facility] [address] [place], [address] Phone: [phone]   Fax:  [phone]  Physical Therapy Treatment  Patient Details  Name: Destiny Shaw MRN: [id number] Date of Birth: 1950-07-23 Referring Provider (PT): Paralee Cancel, MD   Encounter Date: 01/06/2019  PT End of Session - 01/06/19 1128    Visit Number  28    Number of Visits  88    Date for PT Re-Evaluation  01/29/19    Authorization Type  8    Authorization Time Period  of 10 progress note Medicare    PT Start Time  1128    PT Stop Time  1215    PT Time Calculation (min)  47 min    Activity Tolerance  Patient tolerated treatment well    Behavior During Therapy  [facility] for tasks assessed/performed       Past Medical History:  Diagnosis Date  . ADD (attention deficit disorder)   . Anemia   . Anginal pain ([place])    pt states has occas chest pain relates to indigestion; pt uses rest to relieve   . Anxiety   . Arthritis   . Concussion   . Depression   . Diabetes mellitus without complication ([place])   . Dizziness   . Fall   . GERD (gastroesophageal reflux disease)   . Headache   . History of urinary tract infection   . Hyperlipidemia   . Hypertension   . Hypothyroidism   . IBS (irritable bowel syndrome)   . Imbalance   . Numbness    right leg   . Numbness in both hands    comes and goes   . Pneumonia    last episode approx 1 year ago   . Sleep apnea   . Wears glasses     Past Surgical History:  Procedure Laterality Date  . BREAST CYST ASPIRATION Left 1980's   neg  . BREAST CYST EXCISION Right 1980's   neg  . BREAST LUMPECTOMY Right   . BREAST SURGERY    . CARPAL TUNNEL RELEASE    . CESAREAN SECTION     times 2  . COLONOSCOPY WITH PROPOFOL N/A 05/11/2015   Procedure: COLONOSCOPY WITH PROPOFOL;  Surgeon: Manya Silvas, MD;  Location[facility] ENDOSCOPY;  Service: Endoscopy;  Laterality: N/A;  . DE QUERVAIN'S RELEASE Right   .  ESOPHAGOGASTRODUODENOSCOPY (EGD) WITH PROPOFOL N/A 05/11/2015   Procedure: ESOPHAGOGASTRODUODENOSCOPY (EGD) WITH PROPOFOL;  Surgeon: Manya Silvas, MD;  Location: [facility] ENDOSCOPY;  Service: Endoscopy;  Laterality: N/A;  . EYE SURGERY     laser surgery bilat   . HERNIA REPAIR    . KNEE ARTHROSCOPY    . LUMBAR LAMINECTOMY/DECOMPRESSION MICRODISCECTOMY Bilateral 09/14/2015   Procedure: MICRO LUMBAR BILATERAL DECOMPRESSION L4 - L5;  Surgeon: Susa Day, MD;  Location: [facility] ORS;  Service: Orthopedics;  Laterality: Bilateral;  . REDUCTION MAMMAPLASTY Bilateral 1980  . RHINOPLASTY    . TONSILLECTOMY    . TOTAL KNEE ARTHROPLASTY Left 06/18/2016   Procedure: LEFT TOTAL KNEE ARTHROPLASTY;  Surgeon: Paralee Cancel, MD;  Location: [facility] ORS;  Service: Orthopedics;  Laterality: Left;  Adductor Block  . TUBAL LIGATION    . UVULOPALATOPHARYNGOPLASTY      There were no vitals filed for this visit.  Subjective Assessment - 01/06/19 1129    Subjective  L knee is doing pretty good, 5/10. Back is fair 4/5. R knee is about a 7/10 currently. The relief  from last session lasted for about 3 hours.    Pertinent History  Low back pain. Pt states having back surgery about 2-3 years ago. After she had her L TKA, her back started aggravating her due to her walking. Pt also fell about 4 weeks ago onto her L knee. Dr. Alvan Dame checked out her L knee. L knee was fine but has some inflammation.  Pt tries to keep it iced. Pt still recovering for her L TKA but the fall set her back.   Her L knee surgery was last year.  L knee still bothers her a lot.  Had surgery for her back before her L knee surgery. Was doing well until her L knee surgery. The fall made it worse. Feels debilitating. Also has a hard time getting into and out of the car mainly due to her L knee.   Pt fell due to her L knee bucking on her. Pt was trying to pick something up from the side of the couch.  Also has a hard time getting up from the floor.  Pt states having  bladder accidents since after her fall (pt was recommended to tell her MD about it).  Bowel problems since taking medications (was constapated, but after taking medications, pt had diarrhea which was difficult to control. Getting out of the metformin fixed the bowel issue).  Pt states tingling and numbness L lateral LE along the L5 dermatome.  Denies saddle anesthesia.  Pt states that her back problem is mainly on her L side.  Pt landed on her L knee when she fell.  No other falls within the last 6 months. Pt also states being very dizzy since her L knee surgery.  Had PT treatement for her dizziness before which did not help.  The room feels like it is spinning when she gets dizzy.  I feel like it is getting better then she does something.  5/10 low back soreness currently (7/10 at most for the past 7 days after negotiating stairs, 5/10 without doing stairs). 4/10 L knee pain currenglty with gait 5/10 L knee pain at most for the past 7 days.    Patient Stated Goals  Be better able to get into and out of her vehicle (midsized truck), into and out of chairs, be better able to roll in her bed with less L knee pain. Be able to get down on the floor and get back up.     Currently in Pain?  Yes    Pain Score  7     Pain Onset  More than a month ago                               PT Education - 01/06/19 1213    Education Details  ther-ex    [employer]) Educated  Patient    Methods  Explanation;Demonstration;Tactile cues;Verbal cues    Comprehension  Returned demonstration;Verbalized understanding      Objectives   MedbridgeAccess Code: [email or web address]  Latex free bands usedif used   Sitting posture: L trunk rotation  Standing posture: slightLlateral shift  Pt states that pressing her back against the chair feels better.  Gait observation: increased L lateral lean during L LE stance phase.    Worked R knee pain to help pt perform back and L knee  exercises.  Manual therapy  Seated STM R medial knee and distal thigh  Medial glide R patella to promote  movement grade 3  Supine R tibial IR grade 1 to 3- for pain control      Supine STM L medial knee and distal thigh            Supine medial glide L patella grade 3  No B knee pain with gait afterwards     Therapeutic exercise  S/L hip abduction   R 10x2 Good glute med muscle use felt  L 10x2 Good glute med muscle use felt  Supine hip extension in hooklying and working LE straight  R 10x5 seconds  L 10x5 seconds    Response to treatment Worked R knee pain to help pt perform back and L knee exercises.  Pt tolerated session well without aggravation of symptoms.No back or B knee pain during gait after session   Clinical impression Continued working on decreasing R knee pain to promote ability to perform exercises for her low back and L knee. Continued decreasing soft tissue restrictions and improving patellar mobility B knees and improving joint nutrition R knee. Also worked on [employer] and max strengthening to help decrease pelvic drop and lateral lean during gait. No low back and bilateral knee pain after session while ambulating. Pt will benefit from continued skilled physical therapy services to decrease pain, improve strength and function.      PT Short Term Goals - 12/12/17 0930      PT SHORT TERM GOAL #1   Title  Patient will be independent with her HEP to help decrease back and L knee pain and improve ability to perform functional tasks.     Time  3    Period  Weeks    Status  Achieved    Target Date  12/12/17        PT Long Term Goals - 12/17/18 1313      PT LONG TERM GOAL #1   Title  Patient will have a decrease in back pain to 3/10 or less at worst to promote ability to ambulate, turn in bed, perform standing tasks with less pain.     Baseline  6/10 back pain at most (09/24/2017); 7-8/10 at most for the past 7 days, duration of  pain is better since starting PT (10/29/2017); 5-6/10 at most for the past 7 days (11/18/2017); 8/10 at most for the past 7 days but the duration of pain is less compared to prior to starting PT (12/12/2017).  7/10 back pain at most for the past 7 days (pt states doing her exercises helps decrease her pain; 01/02/2018); walking (3/10), standing (4/10), bed mobility (0/10) 02/05/2018; 6/10 back pain at most for the past 7 days (03/03/2018); 6.5/10 back pain at most for the past 7 days (03/27/2018); 7-8/10 at most (05/08/2018); 7/10 back pain at worst (07/22/2018);  7-8/10 at most for the past 7 days (09/22/2018); 5-7/10 at worst for the past 7 days then calms down (10/27/2018); 6/10 back pain at most for the past 7 days (when pt cooks and cleans up in kitchen; 12/17/2018)    Time  6    Period  Weeks    Status  On-going    Target Date  01/29/19      PT LONG TERM GOAL #2   Title  Patient will have a decrease in L knee pain to 3/10 or less at worst to promote ability to ambulate, negotiate stairs, get into and out of a car more comfortably.     Baseline  6/10 L knee pain at most for  the past 3 months (09/24/2017); 7-8/10 at most for the past 7 days, duration of pain is better since starting PT (10/29/2017); 7/10 at most for the past 7 days (11/18/2017); 6/10 L knee pain at most for the past 7 days (12/12/2017); 5/10 L knee pain at most for the past 7 days (pt states doing her exercises helps decrease pain; 01/02/2018); L knee 3/10 (02/05/2018); 4/10 at most for the past 7 days (03/03/2018); 5.5/10 L knee at most for the past 7 days (03/27/2018); 6-7/10 L knee pain at most (05/08/2018); 7/10 L knee pain at worst for the past month (07/22/2018);  6/10 L knee pain at most for the past 7 days (09/22/2018); 5-6/10 at most for the past 7 days sporadically, can get it better with exercise (10/27/2018); 6/10 L knee ache at most for the past 7 days (12/17/2018)    Time  6    Period  Weeks    Status  On-going    Target Date  01/29/19       PT LONG TERM GOAL #3   Title  Patient will improve TUG time to 12 seconds or less as a demonstration of improved functional mobility and balance.     Baseline  TUG no AD: 16.05 seconds on average (09/24/2017); 14.3 seconds on average (10/29/2017); 13 seconds average (11/21/2017); 16.67 seconds average (12/12/2017);  14.17 seconds average (01/02/2018); 11.8 sec sonds (02/05/2018)    Time  6    Period  Weeks    Status  Achieved      PT LONG TERM GOAL #4   Title  Patient will improve her back FOTO score by at least 10 points as a demonstration of improved funtion.      Baseline  Back FOTO: 33 (09/24/2017); 38 (10/29/2017); 36 (11/21/2017); 40 (12/12/2017); 42 (01/02/2018); 47 (03/03/2018)    Time  6    Period  Weeks    Status  Achieved      PT LONG TERM GOAL #5   Title  Patient will improve bilateral LE strength by at least 1/2 MMT grade to promote ability to perform standing tasks.     Time  6    Period  Weeks    Status  Achieved      PT LONG TERM GOAL #6   Title  Patient will improve her Modified Oswestry Low Back pain disablity questionnaire by at least 10% as a demonstration of improved function.     Baseline  48% (09/24/2017); 46% (10/29/2017), (11/21/2017); 56% (12/12/2017); 36% (01/02/2018), (03/03/2018)    Time  6    Period  Weeks    Status  Achieved      PT LONG TERM GOAL #7   Title  Pt will report decreased difficulty stepping into and out of her bathtub as well as getting into and out of her truck to promote ability to get into and out of places.     Baseline  Pt reports difficulty stepping into and out of her bathtub as well as difficulty getting into and out of her truck (01/02/2018); Pt states the truck and bath tub and difficult and painful.  (02/05/2018); Improving per subjective reports (03/03/2018); decreased difficulty with truck tranfser after cues for technique and practice, decreasing difficulty with stepping into and out of the bath tub based on subjective reports (03/27/2018 and 04/01/2018);  Decreasing difficulty stepping into a bathtub. No difficulty getting into and out of her truck (05/08/2018), (07/22/2018); Difficulty stepping into and out of the bath tub. No  difficulty with truck transfers (09/22/2018); Able to get into her bath tub, difficulty getting out (10/27/18)    Time  6    Period  Weeks    Status  Partially Met    Target Date  01/29/19      PT LONG TERM GOAL #8   Title  Patient will improve her BERG balance test score to 46/56 or more as a demonstration of decreased fall risk     Baseline  42/56 (01/02/2018); 54/56 (02/05/2018)    Time  6    Period  Weeks    Status  Achieved      PT LONG TERM GOAL  #9   TITLE  Pt will report minimal to no dizziness related to vertigo to promote balance and decrease fall risk.     Baseline  Pt reports dizziness (related to vertigo) which seem to affect her balance (01/15/2018); Patient reports "a little bit every now and then" (02/05/2018)    Time  6    Period  Weeks    Status  Achieved      PT LONG TERM GOAL  #10   TITLE  Pt will be able to turn 360 degrees to the R and L at least 3 times without dizziness and no LOB to promote safety and decrease fall risk.    Baseline  360 degree turn to the R and L causes a loss of balance (12/17/2018)    Time  6    Period  Weeks    Status  New    Target Date  01/29/19            Plan - 01/06/19 1210    Clinical Impression Statement  Continued working on decreasing R knee pain to promote ability to perform exercises for her low back and L knee. Continued decreasing soft tissue restrictions and improving patellar mobility B knees and improving joint nutrition R knee. Also worked on [employer] and max strengthening to help decrease pelvic drop and lateral lean during gait. No low back and bilateral knee pain after session while ambulating. Pt will benefit from continued skilled physical therapy services to decrease pain, improve strength and function.    Personal Factors and Comorbidities   Age;Comorbidity 3+;Time since onset of injury/illness/exacerbation;Fitness;Past/Current Experience    Comorbidities  Back, knee pain, hx of dizziness, hx of back surgery, hx of C-section    Examination-Activity Limitations  Squat    Stability/Clinical Decision Making  Stable/Uncomplicated    Rehab Potential  Fair    Clinical Impairments Affecting Rehab Potential  difficulty with carry-over of improvements in clinic, chronicity of condition, age, multiple problem areas    PT Frequency  2x / week    PT Duration  6 weeks    PT Treatment/Interventions  Therapeutic activities;Therapeutic exercise;Balance training;Neuromuscular re-education;Patient/family education;Manual techniques;Dry needling;Aquatic Therapy;Electrical Stimulation;Iontophoresis 20m/ml Dexamethasone;Gait training;Canalith Repostioning;Vestibular    PT Next Visit Plan   hip and knee strengthening, core strengthening, patellar mobility, manual techniques, modalities PRN    Consulted and Agree with Plan of Care  Patient       Patient will benefit from skilled therapeutic intervention in order to improve the following deficits and impairments:  Pain, Improper body mechanics, Postural dysfunction, Dizziness, Decreased strength, Difficulty walking, Decreased balance  Visit Diagnosis: Chronic pain of left knee  Muscle weakness (generalized)  Radiculopathy, lumbar region  History of falling     Problem List Patient Active Problem List   Diagnosis Date Noted  . Obese 06/20/2016  .  Status post right knee replacement 06/19/2016  . S/P left TKA 06/18/2016  . Spinal stenosis of lumbar region 09/14/2015    Joneen Boers PT, DPT   01/06/2019, 12:36 PM  [facility] [address], [place], [address] Phone: [phone]   Fax:  [phone]  Name: Destiny Shaw MRN: [id number] Date of Birth: [birth year]

## 2019-01-06 NOTE — Patient Instructions (Signed)
Pt education on how to perform S/L hip abduction HEP properly. Pt demonstrated and verbalized understanding.

## 2019-01-20 ENCOUNTER — Ambulatory Visit: Payer: Medicare Other

## 2019-01-26 ENCOUNTER — Ambulatory Visit: Payer: Medicare Other

## 2019-01-29 ENCOUNTER — Ambulatory Visit: Payer: Medicare Other | Attending: Orthopedic Surgery

## 2019-01-29 ENCOUNTER — Other Ambulatory Visit: Payer: Self-pay

## 2019-01-29 DIAGNOSIS — M6281 Muscle weakness (generalized): Secondary | ICD-10-CM | POA: Insufficient documentation

## 2019-01-29 DIAGNOSIS — M25562 Pain in left knee: Secondary | ICD-10-CM | POA: Diagnosis not present

## 2019-01-29 DIAGNOSIS — R42 Dizziness and giddiness: Secondary | ICD-10-CM | POA: Diagnosis present

## 2019-01-29 DIAGNOSIS — Z9181 History of falling: Secondary | ICD-10-CM | POA: Insufficient documentation

## 2019-01-29 DIAGNOSIS — G8929 Other chronic pain: Secondary | ICD-10-CM | POA: Insufficient documentation

## 2019-01-29 DIAGNOSIS — M5416 Radiculopathy, lumbar region: Secondary | ICD-10-CM | POA: Diagnosis present

## 2019-01-29 NOTE — Therapy (Signed)
[facility] [address] [place], [address] Phone: [phone]   Fax:  [phone]  Physical Therapy Treatment  Patient Details  Name: Destiny Shaw MRN: [id number] Date of Birth: 08-21-1950 Referring Provider (PT): Paralee Cancel, MD   Encounter Date: 01/29/2019  PT End of Session - 01/29/19 1347    Visit Number  62    Number of Visits  41    Date for PT Re-Evaluation  03/12/19    Authorization Type  9    Authorization Time Period  of 10 progress note Medicare    PT Start Time  1354   pt used bathroom   PT Stop Time  1435    PT Time Calculation (min)  41 min    Activity Tolerance  Patient tolerated treatment well    Behavior During Therapy  [facility] for tasks assessed/performed       Past Medical History:  Diagnosis Date  . ADD (attention deficit disorder)   . Anemia   . Anginal pain ([place])    pt states has occas chest pain relates to indigestion; pt uses rest to relieve   . Anxiety   . Arthritis   . Concussion   . Depression   . Diabetes mellitus without complication ([place])   . Dizziness   . Fall   . GERD (gastroesophageal reflux disease)   . Headache   . History of urinary tract infection   . Hyperlipidemia   . Hypertension   . Hypothyroidism   . IBS (irritable bowel syndrome)   . Imbalance   . Numbness    right leg   . Numbness in both hands    comes and goes   . Pneumonia    last episode approx 1 year ago   . Sleep apnea   . Wears glasses     Past Surgical History:  Procedure Laterality Date  . BREAST CYST ASPIRATION Left 1980's   neg  . BREAST CYST EXCISION Right 1980's   neg  . BREAST LUMPECTOMY Right   . BREAST SURGERY    . CARPAL TUNNEL RELEASE    . CESAREAN SECTION     times 2  . COLONOSCOPY WITH PROPOFOL N/A 05/11/2015   Procedure: COLONOSCOPY WITH PROPOFOL;  Surgeon: Manya Silvas, MD;  Location: [facility] ENDOSCOPY;  Service: Endoscopy;  Laterality: N/A;  . DE QUERVAIN'S  RELEASE Right   . ESOPHAGOGASTRODUODENOSCOPY (EGD) WITH PROPOFOL N/A 05/11/2015   Procedure: ESOPHAGOGASTRODUODENOSCOPY (EGD) WITH PROPOFOL;  Surgeon: Manya Silvas, MD;  Location: [facility] ENDOSCOPY;  Service: Endoscopy;  Laterality: N/A;  . EYE SURGERY     laser surgery bilat   . HERNIA REPAIR    . KNEE ARTHROSCOPY    . LUMBAR LAMINECTOMY/DECOMPRESSION MICRODISCECTOMY Bilateral 09/14/2015   Procedure: MICRO LUMBAR BILATERAL DECOMPRESSION L4 - L5;  Surgeon: Susa Day, MD;  Location: [facility] ORS;  Service: Orthopedics;  Laterality: Bilateral;  . REDUCTION MAMMAPLASTY Bilateral 1980  . RHINOPLASTY    . TONSILLECTOMY    . TOTAL KNEE ARTHROPLASTY Left 06/18/2016   Procedure: LEFT TOTAL KNEE ARTHROPLASTY;  Surgeon: Paralee Cancel, MD;  Location: [facility] ORS;  Service: Orthopedics;  Laterality: Left;  Adductor Block  . TUBAL LIGATION    . UVULOPALATOPHARYNGOPLASTY      There were no vitals filed for this visit.  Subjective Assessment - 01/29/19 1354    Subjective  L knee has given her a little trouble. Has balance issues. Room does not seem to  spin and feels like she is not balanced. Does not think she felt like she is climbing up a hill. Saw Dr. Trena Platt PA who checked her dizziness who said that her eye is jumping around. Tried the vertigo home exercises but has not done them because she felt dizzy. Pt states that the PA said that her L eye was the one that jumps.  Does not feel comfortable doing Vertigo PT at the hospital due to too many peole around.  Going up and down steps bothers her knees and back. Husband planning on getting a 2nd rail for her stairs.    Pertinent History  Low back pain. Pt states having back surgery about 2-3 years ago. After she had her L TKA, her back started aggravating her due to her walking. Pt also fell about 4 weeks ago onto her L knee. Dr. Alvan Dame checked out her L knee. L knee was fine but has some inflammation.  Pt tries to keep it iced. Pt still recovering for her L TKA but the  fall set her back.   Her L knee surgery was last year.  L knee still bothers her a lot.  Had surgery for her back before her L knee surgery. Was doing well until her L knee surgery. The fall made it worse. Feels debilitating. Also has a hard time getting into and out of the car mainly due to her L knee.   Pt fell due to her L knee bucking on her. Pt was trying to pick something up from the side of the couch.  Also has a hard time getting up from the floor.  Pt states having bladder accidents since after her fall (pt was recommended to tell her MD about it).  Bowel problems since taking medications (was constapated, but after taking medications, pt had diarrhea which was difficult to control. Getting out of the metformin fixed the bowel issue).  Pt states tingling and numbness L lateral LE along the L5 dermatome.  Denies saddle anesthesia.  Pt states that her back problem is mainly on her L side.  Pt landed on her L knee when she fell.  No other falls within the last 6 months. Pt also states being very dizzy since her L knee surgery.  Had PT treatement for her dizziness before which did not help.  The room feels like it is spinning when she gets dizzy.  I feel like it is getting better then she does something.  5/10 low back soreness currently (7/10 at most for the past 7 days after negotiating stairs, 5/10 without doing stairs). 4/10 L knee pain currenglty with gait 5/10 L knee pain at most for the past 7 days.    Patient Stated Goals  Be better able to get into and out of her vehicle (midsized truck), into and out of chairs, be better able to roll in her bed with less L knee pain. Be able to get down on the floor and get back up.     Currently in Pain?  Yes   no current pain levels provided   Pain Onset  More than a month ago                               PT Education - 01/29/19 1924    Education Details  ther-ex, plan of care    Person(s) Educated  Patient    Methods   Explanation;Demonstration;Tactile cues;Verbal cues  Comprehension  Returned demonstration;Verbalized understanding          Objectives   MedbridgeAccess Code: [email or web address]  Latex free bands usedif used   Sitting posture: L trunk rotation  Standing posture: slightLlateral shift  Pt states that pressing her back against the chair feels better.  Gait observation: increased L lateral lean during L LE stance phase.    Worked R knee pain to help pt perform back and L knee exercises.              Therapeutic exercise  Pt education on importance of doing her home exercises. Pt verbalized understanding.   Reviewed progress/current status with pain goals  Pt states having cardiac issues and currently seeing Dr. Saralyn Pilar. Heart skipping a beat. Got a heart monitor for 72 hours last week Blood pressure L arm sitting, mechanically taken, normal cuff: 155/61, HR 79   360 degree turns   R 2x  L 2x. Unsteadiness whent turning to the L  [employer]  L (+), no nystagmus  R (+), no nystagmus    Dizziness slowly eases   2x/week for 6 wks  Improved exercise technique, movement at target joints, use of target muscles after mod verbal, visual, tactile cues.     Manual therapy  Tension B cervical paraspinal and upper trap muscles  Seated STM to decrease tension to aformentioned muscles    Response to treatment  Fair tolerance to today's session.    Clinical impression Pt seems to have reached a plateau with L knee and back pain levels at worst. Pt however demonstrates overall improved function since initial evaluation. Dizziness and decreased balance returned. Reproduction of dizziness with Marye Round for both R and L sides. Pt will benefit from continued physical therapy services to help decrease dizziness, improve balance, decrease fall risk, pain, and improve strength and function.        PT Short Term Goals -  12/12/17 0930      PT SHORT TERM GOAL #1   Title  Patient will be independent with her HEP to help decrease back and L knee pain and improve ability to perform functional tasks.     Time  3    Period  Weeks    Status  Achieved    Target Date  12/12/17        PT Long Term Goals - 01/29/19 1405      PT LONG TERM GOAL #1   Title  Patient will have a decrease in back pain to 3/10 or less at worst to promote ability to ambulate, turn in bed, perform standing tasks with less pain.     Baseline  6/10 back pain at most (09/24/2017); 7-8/10 at most for the past 7 days, duration of pain is better since starting PT (10/29/2017); 5-6/10 at most for the past 7 days (11/18/2017); 8/10 at most for the past 7 days but the duration of pain is less compared to prior to starting PT (12/12/2017).  7/10 back pain at most for the past 7 days (pt states doing her exercises helps decrease her pain; 01/02/2018); walking (3/10), standing (4/10), bed mobility (0/10) 02/05/2018; 6/10 back pain at most for the past 7 days (03/03/2018); 6.5/10 back pain at most for the past 7 days (03/27/2018); 7-8/10 at most (05/08/2018); 7/10 back pain at worst (07/22/2018);  7-8/10 at most for the past 7 days (09/22/2018); 5-7/10 at worst for the past 7 days then calms down (10/27/2018); 6/10 back pain at most for the  past 7 days (when pt cooks and cleans up in kitchen; 12/17/2018); 6-7/10 back spams pain at most for the past 7 days, usually when she is doing stuff in the kitchen (01/29/2019)    Time  6    Period  Weeks    Status  On-going    Target Date  03/12/19      PT LONG TERM GOAL #2   Title  Patient will have a decrease in L knee pain to 3/10 or less at worst to promote ability to ambulate, negotiate stairs, get into and out of a car more comfortably.     Baseline  6/10 L knee pain at most for the past 3 months (09/24/2017); 7-8/10 at most for the past 7 days, duration of pain is better since starting PT (10/29/2017); 7/10 at most for the past  7 days (11/18/2017); 6/10 L knee pain at most for the past 7 days (12/12/2017); 5/10 L knee pain at most for the past 7 days (pt states doing her exercises helps decrease pain; 01/02/2018); L knee 3/10 (02/05/2018); 4/10 at most for the past 7 days (03/03/2018); 5.5/10 L knee at most for the past 7 days (03/27/2018); 6-7/10 L knee pain at most (05/08/2018); 7/10 L knee pain at worst for the past month (07/22/2018);  6/10 L knee pain at most for the past 7 days (09/22/2018); 5-6/10 at most for the past 7 days sporadically, can get it better with exercise (10/27/2018); 6/10 L knee ache at most for the past 7 days (12/17/2018); 5-6/10 L knee pain at most for the past 7 days such as going up and down steps (01/29/2019)    Time  6    Period  Weeks    Status  On-going    Target Date  03/12/19      PT LONG TERM GOAL #3   Title  Patient will improve TUG time to 12 seconds or less as a demonstration of improved functional mobility and balance.     Baseline  TUG no AD: 16.05 seconds on average (09/24/2017); 14.3 seconds on average (10/29/2017); 13 seconds average (11/21/2017); 16.67 seconds average (12/12/2017);  14.17 seconds average (01/02/2018); 11.8 sec sonds (02/05/2018)    Time  6    Period  Weeks    Status  Achieved      PT LONG TERM GOAL #4   Title  Patient will improve her back FOTO score by at least 10 points as a demonstration of improved funtion.      Baseline  Back FOTO: 33 (09/24/2017); 38 (10/29/2017); 36 (11/21/2017); 40 (12/12/2017); 42 (01/02/2018); 47 (03/03/2018)    Time  6    Period  Weeks    Status  Achieved      PT LONG TERM GOAL #5   Title  Patient will improve bilateral LE strength by at least 1/2 MMT grade to promote ability to perform standing tasks.     Time  6    Period  Weeks    Status  Achieved      PT LONG TERM GOAL #6   Title  Patient will improve her Modified Oswestry Low Back pain disablity questionnaire by at least 10% as a demonstration of improved function.     Baseline  48%  (09/24/2017); 46% (10/29/2017), (11/21/2017); 56% (12/12/2017); 36% (01/02/2018), (03/03/2018)    Time  6    Period  Weeks    Status  Achieved      PT LONG TERM GOAL #7   Title  Pt will report decreased difficulty stepping into and out of her bathtub as well as getting into and out of her truck to promote ability to get into and out of places.     Baseline  Pt reports difficulty stepping into and out of her bathtub as well as difficulty getting into and out of her truck (01/02/2018); Pt states the truck and bath tub and difficult and painful.  (02/05/2018); Improving per subjective reports (03/03/2018); decreased difficulty with truck tranfser after cues for technique and practice, decreasing difficulty with stepping into and out of the bath tub based on subjective reports (03/27/2018 and 04/01/2018); Decreasing difficulty stepping into a bathtub. No difficulty getting into and out of her truck (05/08/2018), (07/22/2018); Difficulty stepping into and out of the bath tub. No difficulty with truck transfers (09/22/2018); Able to get into her bath tub, difficulty getting out (10/27/18)    Time  6    Period  Weeks    Status  Partially Met    Target Date  03/12/19      PT LONG TERM GOAL #8   Title  Patient will improve her BERG balance test score to 46/56 or more as a demonstration of decreased fall risk     Baseline  42/56 (01/02/2018); 54/56 (02/05/2018)    Time  6    Period  Weeks    Status  Achieved      PT LONG TERM GOAL  #9   TITLE  Pt will report minimal to no dizziness related to vertigo to promote balance and decrease fall risk.     Baseline  Pt reports dizziness (related to vertigo) which seem to affect her balance (01/15/2018); Patient reports "a little bit every now and then" (02/05/2018); Pt reports dizziness and decreased balance (01/29/2019)    Time  6    Period  Weeks    Status  On-going    Target Date  03/12/19      PT LONG TERM GOAL  #10   TITLE  Pt will be able to turn 360 degrees to the R  and L at least 3 times without dizziness and no LOB to promote safety and decrease fall risk.    Baseline  360 degree turn to the R and L causes a loss of balance (12/17/2018), (01/29/2019)    Time  6    Period  Weeks    Status  On-going    Target Date  03/12/19            Plan - 01/29/19 1924    Clinical Impression Statement  Pt seems to have reached a plateau with L knee and back pain levels at worst. Pt however demonstrates overall improved function since initial evaluation. Dizziness and decreased balance returned. Reproduction of dizziness with Marye Round for both R and L sides. Pt will benefit from continued physical therapy services to help decrease dizziness, improve balance, decrease fall risk, pain, and improve strength and function.    Personal Factors and Comorbidities  Age;Comorbidity 3+;Time since onset of injury/illness/exacerbation;Fitness;Past/Current Experience    Comorbidities  Back, knee pain, hx of dizziness, hx of back surgery, hx of C-section    Examination-Activity Limitations  Squat    Stability/Clinical Decision Making  Evolving/Moderate complexity    Clinical Decision Making  Moderate    Clinical Presentation due to:  Dizziness and decreased balance returned    Rehab Potential  Fair    Clinical Impairments Affecting Rehab Potential  difficulty with carry-over of improvements in clinic,  chronicity of condition, age, multiple problem areas    PT Frequency  2x / week    PT Duration  6 weeks    PT Treatment/Interventions  Therapeutic activities;Therapeutic exercise;Balance training;Neuromuscular re-education;Patient/family education;Manual techniques;Dry needling;Aquatic Therapy;Electrical Stimulation;Iontophoresis 4m/ml Dexamethasone;Gait training;Canalith Repostioning;Vestibular    PT Next Visit Plan   hip and knee strengthening, core strengthening, patellar mobility, manual techniques, modalities PRN    Consulted and Agree with Plan of Care  Patient        Patient will benefit from skilled therapeutic intervention in order to improve the following deficits and impairments:  Pain, Improper body mechanics, Postural dysfunction, Dizziness, Decreased strength, Difficulty walking, Decreased balance  Visit Diagnosis: Chronic pain of left knee - Plan: PT plan of care cert/re-cert  Muscle weakness (generalized) - Plan: PT plan of care cert/re-cert  Radiculopathy, lumbar region - Plan: PT plan of care cert/re-cert  History of falling - Plan: PT plan of care cert/re-cert  Dizziness and giddiness - Plan: PT plan of care cert/re-cert     Problem List Patient Active Problem List   Diagnosis Date Noted  . Obese 06/20/2016  . Status post right knee replacement 06/19/2016  . S/P left TKA 06/18/2016  . Spinal stenosis of lumbar region 09/14/2015    MJoneen BoersPT, DPT   01/29/2019, 7:38 PM  [facility] [address] [place] [address]Phone: [phone]  Fax:  [phone] Name: Destiny FRIESENHAHNMRN: [id number] of Birth: [birth year]

## 2019-02-02 ENCOUNTER — Ambulatory Visit: Payer: Medicare Other

## 2019-02-05 ENCOUNTER — Ambulatory Visit: Payer: Medicare Other

## 2019-02-18 ENCOUNTER — Ambulatory Visit: Payer: Medicare Other

## 2019-02-23 ENCOUNTER — Ambulatory Visit: Payer: Medicare Other

## 2019-02-23 ENCOUNTER — Other Ambulatory Visit: Payer: Self-pay

## 2019-02-23 DIAGNOSIS — Z9181 History of falling: Secondary | ICD-10-CM

## 2019-02-23 DIAGNOSIS — R42 Dizziness and giddiness: Secondary | ICD-10-CM

## 2019-02-23 DIAGNOSIS — M5416 Radiculopathy, lumbar region: Secondary | ICD-10-CM

## 2019-02-23 DIAGNOSIS — M6281 Muscle weakness (generalized): Secondary | ICD-10-CM

## 2019-02-23 DIAGNOSIS — M25562 Pain in left knee: Secondary | ICD-10-CM | POA: Diagnosis not present

## 2019-02-23 DIAGNOSIS — G8929 Other chronic pain: Secondary | ICD-10-CM

## 2019-02-23 NOTE — Therapy (Signed)
[facility] [address], [place], [address] Phone: [phone]   Fax:  [phone]  Physical Therapy Treatment And Progress Report (11/04/2018 - 02/23/2019)  Patient Details  Name: Destiny Shaw MRN: [id number] Date of Birth: 1950/12/03 Referring Provider (PT): Paralee Cancel, MD    Encounter Date: 02/23/2019  PT End of Session - 02/23/19 1302    Visit Number  60    Number of Visits  22    Date for PT Re-Evaluation  03/12/19    Authorization Type  10    Authorization Time Period  of 10 progress note Medicare    PT Start Time  1302    PT Stop Time  1405    PT Time Calculation (min)  63 min    Activity Tolerance  Patient tolerated treatment well    Behavior During Therapy  [facility] for tasks assessed/performed       Past Medical History:  Diagnosis Date  . ADD (attention deficit disorder)   . Anemia   . Anginal pain ([place])    pt states has occas chest pain relates to indigestion; pt uses rest to relieve   . Anxiety   . Arthritis   . Concussion   . Depression   . Diabetes mellitus without complication ([place])   . Dizziness   . Fall   . GERD (gastroesophageal reflux disease)   . Headache   . History of urinary tract infection   . Hyperlipidemia   . Hypertension   . Hypothyroidism   . IBS (irritable bowel syndrome)   . Imbalance   . Numbness    right leg   . Numbness in both hands    comes and goes   . Pneumonia    last episode approx 1 year ago   . Sleep apnea   . Wears glasses     Past Surgical History:  Procedure Laterality Date  . BREAST CYST ASPIRATION Left 1980's   neg  . BREAST CYST EXCISION Right 1980's   neg  . BREAST LUMPECTOMY Right   . BREAST SURGERY    . CARPAL TUNNEL RELEASE    . CESAREAN SECTION     times 2  . COLONOSCOPY WITH PROPOFOL N/A 05/11/2015   Procedure: COLONOSCOPY WITH PROPOFOL;  Surgeon: Manya Silvas, MD;  Location: [facility] ENDOSCOPY;  Service: Endoscopy;   Laterality: N/A;  . DE QUERVAIN'S RELEASE Right   . ESOPHAGOGASTRODUODENOSCOPY (EGD) WITH PROPOFOL N/A 05/11/2015   Procedure: ESOPHAGOGASTRODUODENOSCOPY (EGD) WITH PROPOFOL;  Surgeon: Manya Silvas, MD;  Location: [facility] ENDOSCOPY;  Service: Endoscopy;  Laterality: N/A;  . EYE SURGERY     laser surgery bilat   . HERNIA REPAIR    . KNEE ARTHROSCOPY    . LUMBAR LAMINECTOMY/DECOMPRESSION MICRODISCECTOMY Bilateral 09/14/2015   Procedure: MICRO LUMBAR BILATERAL DECOMPRESSION L4 - L5;  Surgeon: Susa Day, MD;  Location: [facility] ORS;  Service: Orthopedics;  Laterality: Bilateral;  . REDUCTION MAMMAPLASTY Bilateral 1980  . RHINOPLASTY    . TONSILLECTOMY    . TOTAL KNEE ARTHROPLASTY Left 06/18/2016   Procedure: LEFT TOTAL KNEE ARTHROPLASTY;  Surgeon: Paralee Cancel, MD;  Location: [facility] ORS;  Service: Orthopedics;  Laterality: Left;  Adductor Block  . TUBAL LIGATION    . UVULOPALATOPHARYNGOPLASTY      There were no vitals filed for this visit.  Subjective Assessment - 02/23/19 1304    Subjective  Dizziness is a little bit better. Does not have it as often. Has  been doing her vertigo exercises at home. R knee and R hip has been giving her a fit. 6/10 R hip and 5/10 R knee. L knee is 5/10 currently while walking 6/10 back pain when walking. Sit <> stands bother her back and knees.  Pt wants her R hip and knee not to hurt as much when she walks. Wants to work on her R LE today instead of her dizziness. Currently working in her vertigo exercises at home.    Pertinent History  Low back pain. Pt states having back surgery about 2-3 years ago. After she had her L TKA, her back started aggravating her due to her walking. Pt also fell about 4 weeks ago onto her L knee. Dr. Alvan Dame checked out her L knee. L knee was fine but has some inflammation.  Pt tries to keep it iced. Pt still recovering for her L TKA but the fall set her back.   Her L knee surgery was last year.  L knee still bothers her a lot.  Had surgery for her  back before her L knee surgery. Was doing well until her L knee surgery. The fall made it worse. Feels debilitating. Also has a hard time getting into and out of the car mainly due to her L knee.   Pt fell due to her L knee bucking on her. Pt was trying to pick something up from the side of the couch.  Also has a hard time getting up from the floor.  Pt states having bladder accidents since after her fall (pt was recommended to tell her MD about it).  Bowel problems since taking medications (was constapated, but after taking medications, pt had diarrhea which was difficult to control. Getting out of the metformin fixed the bowel issue).  Pt states tingling and numbness L lateral LE along the L5 dermatome.  Denies saddle anesthesia.  Pt states that her back problem is mainly on her L side.  Pt landed on her L knee when she fell.  No other falls within the last 6 months. Pt also states being very dizzy since her L knee surgery.  Had PT treatement for her dizziness before which did not help.  The room feels like it is spinning when she gets dizzy.  I feel like it is getting better then she does something.  5/10 low back soreness currently (7/10 at most for the past 7 days after negotiating stairs, 5/10 without doing stairs). 4/10 L knee pain currenglty with gait 5/10 L knee pain at most for the past 7 days.    Patient Stated Goals  Be better able to get into and out of her vehicle (midsized truck), into and out of chairs, be better able to roll in her bed with less L knee pain. Be able to get down on the floor and get back up.     Currently in Pain?  Yes    Pain Score  6     Pain Onset  More than a month ago                               PT Education - 02/23/19 1308    Education Details  ther-ex    Person(s) Educated  Patient    Methods  Explanation;Demonstration;Tactile cues;Verbal cues    Comprehension  Returned demonstration;Verbalized understanding          Objectives   MedbridgeAccess Code: [email or web address]  Latex free bands usedif used   Sitting posture: L trunk rotation  Standing posture: slightLlateral shift  Pt states that pressing her back against the chair feels better.  Gait observation: increased L lateral lean during L LE stance phase.    Worked R knee pain to help pt perform back and L knee exercises.     Therapeutic exercise  360 degree turns              R 3x             L 3x. Improved steadiness  Slight dizziness    TTP R anterior greater trochanter, TTP L medial knee    reproduction of R hip pain with manual resistance to R TFL muscle in sitting  Sit <> stand with emphasis on R glute muscle use 10x, then 4x   Improved exercise technique, movement at target joints, use of target muscles after mod verbal, visual, tactile cues.       Manual therapy   Seated STM R lateral hamstrings, vastus lateralis, IT band, medial thigh   Decreased R knee tightness and hip tightness with gait afterwards  Seated STM R sartorius muscle, proximal end.   Decreased R knee and hip pain to 4/10 and decreased tightness.      Response to treatment  Decreased R hip, knee, L knee, and low back pain after session   Clinical impression Worked on decreasing R hip and knee pain to decrease L LE and low back compensation with standing activities and help decrease back and L knee pain or prevent them from getting worse overall. Decreased R LE pain with treatment to improve R glute max strength as well as decrease soft tissue tension R lateral hamstrings, vastus lateralis, IT band, medial thigh and sartorius muscle. Decreased L knee and back pain with gait after treatment to decrease R hip and knee pain. No complain of dizziness throughout session except when turning 360 degrees. Pt making some progress with dizziness based on subjective reports of performing her Epley maneuvers at home.  Reviewed seated L knee flexion AAROM to promote L knee flexion ROM to improve ability to get into and out of her bath tub at home. Pt tolerated session well without aggravation of symptoms. Pt will benefit from continued skilled physical therapy services to decrease pain, dizziness, improve balance, strength and function.      PT Short Term Goals - 12/12/17 0930      PT SHORT TERM GOAL #1   Title  Patient will be independent with her HEP to help decrease back and L knee pain and improve ability to perform functional tasks.     Time  3    Period  Weeks    Status  Achieved    Target Date  12/12/17        PT Long Term Goals - 02/23/19 1311      PT LONG TERM GOAL #1   Title  Patient will have a decrease in back pain to 3/10 or less at worst to promote ability to ambulate, turn in bed, perform standing tasks with less pain.     Baseline  6/10 back pain at most (09/24/2017); 7-8/10 at most for the past 7 days, duration of pain is better since starting PT (10/29/2017); 5-6/10 at most for the past 7 days (11/18/2017); 8/10 at most for the past 7 days but the duration of pain is less compared to prior to starting PT (12/12/2017).  7/10 back pain at  most for the past 7 days (pt states doing her exercises helps decrease her pain; 01/02/2018); walking (3/10), standing (4/10), bed mobility (0/10) 02/05/2018; 6/10 back pain at most for the past 7 days (03/03/2018); 6.5/10 back pain at most for the past 7 days (03/27/2018); 7-8/10 at most (05/08/2018); 7/10 back pain at worst (07/22/2018);  7-8/10 at most for the past 7 days (09/22/2018); 5-7/10 at worst for the past 7 days then calms down (10/27/2018); 6/10 back pain at most for the past 7 days (when pt cooks and cleans up in kitchen; 12/17/2018); 6-7/10 back spams pain at most for the past 7 days, usually when she is doing stuff in the kitchen (01/29/2019)    Time  6    Period  Weeks    Status  On-going      PT LONG TERM GOAL #2   Title  Patient will have a decrease  in L knee pain to 3/10 or less at worst to promote ability to ambulate, negotiate stairs, get into and out of a car more comfortably.     Baseline  6/10 L knee pain at most for the past 3 months (09/24/2017); 7-8/10 at most for the past 7 days, duration of pain is better since starting PT (10/29/2017); 7/10 at most for the past 7 days (11/18/2017); 6/10 L knee pain at most for the past 7 days (12/12/2017); 5/10 L knee pain at most for the past 7 days (pt states doing her exercises helps decrease pain; 01/02/2018); L knee 3/10 (02/05/2018); 4/10 at most for the past 7 days (03/03/2018); 5.5/10 L knee at most for the past 7 days (03/27/2018); 6-7/10 L knee pain at most (05/08/2018); 7/10 L knee pain at worst for the past month (07/22/2018);  6/10 L knee pain at most for the past 7 days (09/22/2018); 5-6/10 at most for the past 7 days sporadically, can get it better with exercise (10/27/2018); 6/10 L knee ache at most for the past 7 days (12/17/2018); 5-6/10 L knee pain at most for the past 7 days such as going up and down steps (01/29/2019)    Time  6    Period  Weeks    Status  On-going      PT LONG TERM GOAL #3   Title  Patient will improve TUG time to 12 seconds or less as a demonstration of improved functional mobility and balance.     Baseline  TUG no AD: 16.05 seconds on average (09/24/2017); 14.3 seconds on average (10/29/2017); 13 seconds average (11/21/2017); 16.67 seconds average (12/12/2017);  14.17 seconds average (01/02/2018); 11.8 sec sonds (02/05/2018)    Time  6    Period  Weeks    Status  Achieved      PT LONG TERM GOAL #4   Title  Patient will improve her back FOTO score by at least 10 points as a demonstration of improved funtion.      Baseline  Back FOTO: 33 (09/24/2017); 38 (10/29/2017); 36 (11/21/2017); 40 (12/12/2017); 42 (01/02/2018); 47 (03/03/2018)    Time  6    Period  Weeks    Status  Achieved      PT LONG TERM GOAL #5   Title  Patient will improve bilateral LE strength by at least 1/2 MMT  grade to promote ability to perform standing tasks.     Time  6    Period  Weeks    Status  Achieved      PT LONG TERM GOAL #6  Title  Patient will improve her Modified Oswestry Low Back pain disablity questionnaire by at least 10% as a demonstration of improved function.     Baseline  48% (09/24/2017); 46% (10/29/2017), (11/21/2017); 56% (12/12/2017); 36% (01/02/2018), (03/03/2018)    Time  6    Period  Weeks    Status  Achieved      PT LONG TERM GOAL #7   Title  Pt will report decreased difficulty stepping into and out of her bathtub as well as getting into and out of her truck to promote ability to get into and out of places.     Baseline  Pt reports difficulty stepping into and out of her bathtub as well as difficulty getting into and out of her truck (01/02/2018); Pt states the truck and bath tub and difficult and painful.  (02/05/2018); Improving per subjective reports (03/03/2018); decreased difficulty with truck tranfser after cues for technique and practice, decreasing difficulty with stepping into and out of the bath tub based on subjective reports (03/27/2018 and 04/01/2018); Decreasing difficulty stepping into a bathtub. No difficulty getting into and out of her truck (05/08/2018), (07/22/2018); Difficulty stepping into and out of the bath tub. No difficulty with truck transfers (09/22/2018); Able to get into her bath tub, difficulty getting out (10/27/18); Still has difficulty getting into and out of bathtub (02/23/2019)    Time  6    Period  Weeks    Status  Partially Met    Target Date  03/12/19      PT LONG TERM GOAL #8   Title  Patient will improve her BERG balance test score to 46/56 or more as a demonstration of decreased fall risk     Baseline  42/56 (01/02/2018); 54/56 (02/05/2018)    Time  6    Period  Weeks    Status  Achieved      PT LONG TERM GOAL  #9   TITLE  Pt will report minimal to no dizziness related to vertigo to promote balance and decrease fall risk.     Baseline  Pt  reports dizziness (related to vertigo) which seem to affect her balance (01/15/2018); Patient reports "a little bit every now and then" (02/05/2018); Pt reports dizziness and decreased balance (01/29/2019); Dizziness has improved some (02/23/2019)    Time  6    Period  Weeks    Status  On-going    Target Date  03/12/19      PT LONG TERM GOAL  #10   TITLE  Pt will be able to turn 360 degrees to the R and L at least 3 times without dizziness and no LOB to promote safety and decrease fall risk.    Baseline  360 degree turn to the R and L causes a loss of balance (12/17/2018), (01/29/2019). able to perform 3 times with each side with improved balance, minimal dizziness. (02/23/2019)    Time  6    Period  Weeks    Status  Partially Met    Target Date  03/12/19            Plan - 02/23/19 1310    Clinical Impression Statement  Worked on decreasing R hip and knee pain to decrease L LE and low back compensation with standing activities and help decrease back and L knee pain or prevent them from getting worse overall. Decreased R LE pain with treatment to improve R glute max strength as well as decrease soft tissue tension R lateral hamstrings, vastus  lateralis, IT band, medial thigh and sartorius muscle. Decreased L knee and back pain with gait after treatment to decrease R hip and knee pain. No complain of dizziness throughout session except when turning 360 degrees. Pt making some progress with dizziness based on subjective reports of performing her Epley maneuvers at home. Reviewed seated L knee flexion AAROM to promote L knee flexion ROM to improve ability to get into and out of her bath tub at home. Pt tolerated session well without aggravation of symptoms. Pt will benefit from continued skilled physical therapy services to decrease pain, dizziness, improve balance, strength and function.    Personal Factors and Comorbidities  Age;Comorbidity 3+;Time since onset of  injury/illness/exacerbation;Fitness;Past/Current Experience    Comorbidities  Back, knee pain, hx of dizziness, hx of back surgery, hx of C-section    Examination-Activity Limitations  Squat    Stability/Clinical Decision Making  Evolving/Moderate complexity    Clinical Decision Making  Moderate    Clinical Presentation due to:  R hip and knee pain    Rehab Potential  Fair    Clinical Impairments Affecting Rehab Potential  difficulty with carry-over of improvements in clinic, chronicity of condition, age, multiple problem areas    PT Frequency  2x / week    PT Duration  6 weeks    PT Treatment/Interventions  Therapeutic activities;Therapeutic exercise;Balance training;Neuromuscular re-education;Patient/family education;Manual techniques;Dry needling;Aquatic Therapy;Electrical Stimulation;Iontophoresis 65m/ml Dexamethasone;Gait training;Canalith Repostioning;Vestibular    PT Next Visit Plan   hip and knee strengthening, core strengthening, patellar mobility, manual techniques, modalities PRN    Consulted and Agree with Plan of Care  Patient       Patient will benefit from skilled therapeutic intervention in order to improve the following deficits and impairments:  Pain, Improper body mechanics, Postural dysfunction, Dizziness, Decreased strength, Difficulty walking, Decreased balance  Visit Diagnosis: Chronic pain of left knee  Muscle weakness (generalized)  Radiculopathy, lumbar region  History of falling  Dizziness and giddiness     Problem List Patient Active Problem List   Diagnosis Date Noted  . Obese 06/20/2016  . Status post right knee replacement 06/19/2016  . S/P left TKA 06/18/2016  . Spinal stenosis of lumbar region 09/14/2015     Thank you for your referral.   MJoneen BoersPT, DPT   02/23/2019, 4:50 PM  [facility] [id number] [place] [address]Phone: [phone]  Fax:   [phone] Name: Destiny BUHLMRN: [id number] of Birth: 4May 24, 1952

## 2019-03-04 ENCOUNTER — Ambulatory Visit: Payer: Medicare Other

## 2019-03-05 ENCOUNTER — Ambulatory Visit: Payer: Medicare Other | Attending: Orthopedic Surgery

## 2019-03-05 ENCOUNTER — Other Ambulatory Visit: Payer: Self-pay

## 2019-03-05 DIAGNOSIS — M6281 Muscle weakness (generalized): Secondary | ICD-10-CM | POA: Diagnosis present

## 2019-03-05 DIAGNOSIS — Z9181 History of falling: Secondary | ICD-10-CM | POA: Insufficient documentation

## 2019-03-05 DIAGNOSIS — G8929 Other chronic pain: Secondary | ICD-10-CM | POA: Diagnosis present

## 2019-03-05 DIAGNOSIS — M25551 Pain in right hip: Secondary | ICD-10-CM | POA: Insufficient documentation

## 2019-03-05 DIAGNOSIS — M5416 Radiculopathy, lumbar region: Secondary | ICD-10-CM | POA: Diagnosis present

## 2019-03-05 DIAGNOSIS — M25562 Pain in left knee: Secondary | ICD-10-CM | POA: Insufficient documentation

## 2019-03-05 DIAGNOSIS — R42 Dizziness and giddiness: Secondary | ICD-10-CM | POA: Insufficient documentation

## 2019-03-05 DIAGNOSIS — M25561 Pain in right knee: Secondary | ICD-10-CM | POA: Insufficient documentation

## 2019-03-05 NOTE — Therapy (Signed)
[facility] [address] [place], [address] Phone: [phone]   Fax:  [phone]  Physical Therapy Treatment  Patient Details  Name: Destiny Shaw MRN: [id number] Date of Birth: 09/20/1950 Referring Provider (PT): Paralee Cancel, MD   Encounter Date: 03/05/2019  PT End of Session - 03/05/19 1650    Visit Number  51    Number of Visits  84    Date for PT Re-Evaluation  03/12/19    Authorization Type  1    Authorization Time Period  of 10 progress note Medicare    PT Start Time  6195    PT Stop Time  1730    PT Time Calculation (min)  40 min    Activity Tolerance  Patient tolerated treatment well    Behavior During Therapy  [facility] for tasks assessed/performed       Past Medical History:  Diagnosis Date  . ADD (attention deficit disorder)   . Anemia   . Anginal pain ([place])    pt states has occas chest pain relates to indigestion; pt uses rest to relieve   . Anxiety   . Arthritis   . Concussion   . Depression   . Diabetes mellitus without complication ([place])   . Dizziness   . Fall   . GERD (gastroesophageal reflux disease)   . Headache   . History of urinary tract infection   . Hyperlipidemia   . Hypertension   . Hypothyroidism   . IBS (irritable bowel syndrome)   . Imbalance   . Numbness    right leg   . Numbness in both hands    comes and goes   . Pneumonia    last episode approx 1 year ago   . Sleep apnea   . Wears glasses     Past Surgical History:  Procedure Laterality Date  . BREAST CYST ASPIRATION Left 1980's   neg  . BREAST CYST EXCISION Right 1980's   neg  . BREAST LUMPECTOMY Right   . BREAST SURGERY    . CARPAL TUNNEL RELEASE    . CESAREAN SECTION     times 2  . COLONOSCOPY WITH PROPOFOL N/A 05/11/2015   Procedure: COLONOSCOPY WITH PROPOFOL;  Surgeon: Manya Silvas, MD;  Location: [facility] ENDOSCOPY;  Service: Endoscopy;  Laterality: N/A;  . DE QUERVAIN'S RELEASE Right   .  ESOPHAGOGASTRODUODENOSCOPY (EGD) WITH PROPOFOL N/A 05/11/2015   Procedure: ESOPHAGOGASTRODUODENOSCOPY (EGD) WITH PROPOFOL;  Surgeon: Manya Silvas, MD;  Location: [facility] ENDOSCOPY;  Service: Endoscopy;  Laterality: N/A;  . EYE SURGERY     laser surgery bilat   . HERNIA REPAIR    . KNEE ARTHROSCOPY    . LUMBAR LAMINECTOMY/DECOMPRESSION MICRODISCECTOMY Bilateral 09/14/2015   Procedure: MICRO LUMBAR BILATERAL DECOMPRESSION L4 - L5;  Surgeon: Susa Day, MD;  Location: [facility] ORS;  Service: Orthopedics;  Laterality: Bilateral;  . REDUCTION MAMMAPLASTY Bilateral 1980  . RHINOPLASTY    . TONSILLECTOMY    . TOTAL KNEE ARTHROPLASTY Left 06/18/2016   Procedure: LEFT TOTAL KNEE ARTHROPLASTY;  Surgeon: Paralee Cancel, MD;  Location: [facility] ORS;  Service: Orthopedics;  Laterality: Left;  Adductor Block  . TUBAL LIGATION    . UVULOPALATOPHARYNGOPLASTY      There were no vitals filed for this visit.  Subjective Assessment - 03/05/19 1652    Subjective  Back is a little uncomfortable, L knee is good (3.5/10 currently). The R medial knee bothers her (5/10 currently, increases  with increased walking). The R hip is ok. Dizziness, pt thinks is a little better.    Pertinent History  Low back pain. Pt states having back surgery about 2-3 years ago. After she had her L TKA, her back started aggravating her due to her walking. Pt also fell about 4 weeks ago onto her L knee. Dr. Alvan Dame checked out her L knee. L knee was fine but has some inflammation.  Pt tries to keep it iced. Pt still recovering for her L TKA but the fall set her back.   Her L knee surgery was last year.  L knee still bothers her a lot.  Had surgery for her back before her L knee surgery. Was doing well until her L knee surgery. The fall made it worse. Feels debilitating. Also has a hard time getting into and out of the car mainly due to her L knee.   Pt fell due to her L knee bucking on her. Pt was trying to pick something up from the side of the couch.  Also  has a hard time getting up from the floor.  Pt states having bladder accidents since after her fall (pt was recommended to tell her MD about it).  Bowel problems since taking medications (was constapated, but after taking medications, pt had diarrhea which was difficult to control. Getting out of the metformin fixed the bowel issue).  Pt states tingling and numbness L lateral LE along the L5 dermatome.  Denies saddle anesthesia.  Pt states that her back problem is mainly on her L side.  Pt landed on her L knee when she fell.  No other falls within the last 6 months. Pt also states being very dizzy since her L knee surgery.  Had PT treatement for her dizziness before which did not help.  The room feels like it is spinning when she gets dizzy.  I feel like it is getting better then she does something.  5/10 low back soreness currently (7/10 at most for the past 7 days after negotiating stairs, 5/10 without doing stairs). 4/10 L knee pain currenglty with gait 5/10 L knee pain at most for the past 7 days.    Patient Stated Goals  Be better able to get into and out of her vehicle (midsized truck), into and out of chairs, be better able to roll in her bed with less L knee pain. Be able to get down on the floor and get back up.     Currently in Pain?  Yes    Pain Score  5     Pain Location  Knee    Pain Orientation  Left    Pain Type  Chronic pain    Pain Onset  More than a month ago                               PT Education - 03/05/19 1717    Education Details  ther-ex    Person(s) Educated  Patient    Methods  Explanation;Demonstration;Tactile cues;Verbal cues    Comprehension  Returned demonstration;Verbalized understanding      Objectives   MedbridgeAccess Code: [email or web address]  Latex free bands usedif used   Sitting posture: L trunk rotation  Standing posture: slightLlateral shift  Pt states that pressing her back against the chair feels better.  Gait  observation: increased L lateral lean during L LE stance phase.    Worked R knee  pain to help pt perform back and L knee exercises.     Therapeutic exercise   Gait: antalgic, decreased stance R LE, R femoral IR during R LE stance phase. Unsteady due to R knee pain.   Pt was recommended to use her [facility] again for safety. Pt verbalized understanding.   Seated R hip ER 3 lbs 10x, then 10x5 seconds for 3 sets  Seated R knee flexion red band 10x3  Standing L lateral shift correction 10x5 seconds for 3 sets  SLS with B UE assist to promote glute med muscle strengthening to decrease lateral lean during stance phase of gait  L 10x10 seconds   R 8x10 seconds. R glute med muscle weakness compared to L.    Decreased R medial knee pain with gait afterwards  Standing hip abduction with B UE assist   R 10x3  L 10x3   Improved exercise technique, movement at target joints, use of target muscles after mod verbal, visual, tactile cues.     Response to treatment  Decreased R medial knee pain and improved steadiness with gait after session.    Clinical impression Worked on decreasing R knee pain to improve steadiness with gait, and decrease fall risk to protect L knee and low back PT progress. Decreased R medial knee pain after working on glute med strength to promote better mechanics to decrease valgus stress. Pt tolerated session well without aggravation of symptoms. Pt will benefit from continued skilled physical therapy services to decrease pain, improve strength, balance and function.      PT Short Term Goals - 12/12/17 0930      PT SHORT TERM GOAL #1   Title  Patient will be independent with her HEP to help decrease back and L knee pain and improve ability to perform functional tasks.     Time  3    Period  Weeks    Status  Achieved    Target Date  12/12/17        PT Long Term Goals - 02/23/19 1311      PT LONG TERM GOAL #1   Title  Patient  will have a decrease in back pain to 3/10 or less at worst to promote ability to ambulate, turn in bed, perform standing tasks with less pain.     Baseline  6/10 back pain at most (09/24/2017); 7-8/10 at most for the past 7 days, duration of pain is better since starting PT (10/29/2017); 5-6/10 at most for the past 7 days (11/18/2017); 8/10 at most for the past 7 days but the duration of pain is less compared to prior to starting PT (12/12/2017).  7/10 back pain at most for the past 7 days (pt states doing her exercises helps decrease her pain; 01/02/2018); walking (3/10), standing (4/10), bed mobility (0/10) 02/05/2018; 6/10 back pain at most for the past 7 days (03/03/2018); 6.5/10 back pain at most for the past 7 days (03/27/2018); 7-8/10 at most (05/08/2018); 7/10 back pain at worst (07/22/2018);  7-8/10 at most for the past 7 days (09/22/2018); 5-7/10 at worst for the past 7 days then calms down (10/27/2018); 6/10 back pain at most for the past 7 days (when pt cooks and cleans up in kitchen; 12/17/2018); 6-7/10 back spams pain at most for the past 7 days, usually when she is doing stuff in the kitchen (01/29/2019)    Time  6    Period  Weeks    Status  On-going  PT LONG TERM GOAL #2   Title  Patient will have a decrease in L knee pain to 3/10 or less at worst to promote ability to ambulate, negotiate stairs, get into and out of a car more comfortably.     Baseline  6/10 L knee pain at most for the past 3 months (09/24/2017); 7-8/10 at most for the past 7 days, duration of pain is better since starting PT (10/29/2017); 7/10 at most for the past 7 days (11/18/2017); 6/10 L knee pain at most for the past 7 days (12/12/2017); 5/10 L knee pain at most for the past 7 days (pt states doing her exercises helps decrease pain; 01/02/2018); L knee 3/10 (02/05/2018); 4/10 at most for the past 7 days (03/03/2018); 5.5/10 L knee at most for the past 7 days (03/27/2018); 6-7/10 L knee pain at most (05/08/2018); 7/10 L knee pain at  worst for the past month (07/22/2018);  6/10 L knee pain at most for the past 7 days (09/22/2018); 5-6/10 at most for the past 7 days sporadically, can get it better with exercise (10/27/2018); 6/10 L knee ache at most for the past 7 days (12/17/2018); 5-6/10 L knee pain at most for the past 7 days such as going up and down steps (01/29/2019)    Time  6    Period  Weeks    Status  On-going      PT LONG TERM GOAL #3   Title  Patient will improve TUG time to 12 seconds or less as a demonstration of improved functional mobility and balance.     Baseline  TUG no AD: 16.05 seconds on average (09/24/2017); 14.3 seconds on average (10/29/2017); 13 seconds average (11/21/2017); 16.67 seconds average (12/12/2017);  14.17 seconds average (01/02/2018); 11.8 sec sonds (02/05/2018)    Time  6    Period  Weeks    Status  Achieved      PT LONG TERM GOAL #4   Title  Patient will improve her back FOTO score by at least 10 points as a demonstration of improved funtion.      Baseline  Back FOTO: 33 (09/24/2017); 38 (10/29/2017); 36 (11/21/2017); 40 (12/12/2017); 42 (01/02/2018); 47 (03/03/2018)    Time  6    Period  Weeks    Status  Achieved      PT LONG TERM GOAL #5   Title  Patient will improve bilateral LE strength by at least 1/2 MMT grade to promote ability to perform standing tasks.     Time  6    Period  Weeks    Status  Achieved      PT LONG TERM GOAL #6   Title  Patient will improve her Modified Oswestry Low Back pain disablity questionnaire by at least 10% as a demonstration of improved function.     Baseline  48% (09/24/2017); 46% (10/29/2017), (11/21/2017); 56% (12/12/2017); 36% (01/02/2018), (03/03/2018)    Time  6    Period  Weeks    Status  Achieved      PT LONG TERM GOAL #7   Title  Pt will report decreased difficulty stepping into and out of her bathtub as well as getting into and out of her truck to promote ability to get into and out of places.     Baseline  Pt reports difficulty stepping into and out of  her bathtub as well as difficulty getting into and out of her truck (01/02/2018); Pt states the truck and bath tub and difficult  and painful.  (02/05/2018); Improving per subjective reports (03/03/2018); decreased difficulty with truck tranfser after cues for technique and practice, decreasing difficulty with stepping into and out of the bath tub based on subjective reports (03/27/2018 and 04/01/2018); Decreasing difficulty stepping into a bathtub. No difficulty getting into and out of her truck (05/08/2018), (07/22/2018); Difficulty stepping into and out of the bath tub. No difficulty with truck transfers (09/22/2018); Able to get into her bath tub, difficulty getting out (10/27/18); Still has difficulty getting into and out of bathtub (02/23/2019)    Time  6    Period  Weeks    Status  Partially Met    Target Date  03/12/19      PT LONG TERM GOAL #8   Title  Patient will improve her BERG balance test score to 46/56 or more as a demonstration of decreased fall risk     Baseline  42/56 (01/02/2018); 54/56 (02/05/2018)    Time  6    Period  Weeks    Status  Achieved      PT LONG TERM GOAL  #9   TITLE  Pt will report minimal to no dizziness related to vertigo to promote balance and decrease fall risk.     Baseline  Pt reports dizziness (related to vertigo) which seem to affect her balance (01/15/2018); Patient reports "a little bit every now and then" (02/05/2018); Pt reports dizziness and decreased balance (01/29/2019); Dizziness has improved some (02/23/2019)    Time  6    Period  Weeks    Status  On-going    Target Date  03/12/19      PT LONG TERM GOAL  #10   TITLE  Pt will be able to turn 360 degrees to the R and L at least 3 times without dizziness and no LOB to promote safety and decrease fall risk.    Baseline  360 degree turn to the R and L causes a loss of balance (12/17/2018), (01/29/2019). able to perform 3 times with each side with improved balance, minimal dizziness. (02/23/2019)    Time  6     Period  Weeks    Status  Partially Met    Target Date  03/12/19            Plan - 03/05/19 1717    Clinical Impression Statement  Worked on decreasing R knee pain to improve steadiness with gait, and decrease fall risk to protect L knee and low back PT progress. Decreased R medial knee pain after working on glute med strength to promote better mechanics to decrease valgus stress. Pt tolerated session well without aggravation of symptoms. Pt will benefit from continued skilled physical therapy services to decrease pain, improve strength, balance and function.    Personal Factors and Comorbidities  Age;Comorbidity 3+;Time since onset of injury/illness/exacerbation;Fitness;Past/Current Experience    Comorbidities  Back, knee pain, hx of dizziness, hx of back surgery, hx of C-section    Examination-Activity Limitations  Squat    Stability/Clinical Decision Making  Evolving/Moderate complexity    Rehab Potential  Fair    Clinical Impairments Affecting Rehab Potential  difficulty with carry-over of improvements in clinic, chronicity of condition, age, multiple problem areas    PT Frequency  2x / week    PT Duration  6 weeks    PT Treatment/Interventions  Therapeutic activities;Therapeutic exercise;Balance training;Neuromuscular re-education;Patient/family education;Manual techniques;Dry needling;Aquatic Therapy;Electrical Stimulation;Iontophoresis 7m/ml Dexamethasone;Gait training;Canalith Repostioning;Vestibular    PT Next Visit Plan   hip and knee strengthening,  core strengthening, patellar mobility, manual techniques, modalities PRN    Consulted and Agree with Plan of Care  Patient       Patient will benefit from skilled therapeutic intervention in order to improve the following deficits and impairments:  Pain, Improper body mechanics, Postural dysfunction, Dizziness, Decreased strength, Difficulty walking, Decreased balance  Visit Diagnosis: Chronic pain of left knee  Muscle weakness  (generalized)  Radiculopathy, lumbar region  History of falling     Problem List Patient Active Problem List   Diagnosis Date Noted  . Obese 06/20/2016  . Status post right knee replacement 06/19/2016  . S/P left TKA 06/18/2016  . Spinal stenosis of lumbar region 09/14/2015    Joneen Boers PT, DPT   03/05/2019, 6:35 PM  [facility] [address], [place], [address] Phone: [phone]   Fax:  [phone]  Name: Destiny Shaw MRN: [id number] Date of Birth: 01-11-1951

## 2019-03-11 ENCOUNTER — Ambulatory Visit: Payer: Medicare Other

## 2019-03-18 ENCOUNTER — Ambulatory Visit: Payer: Medicare Other

## 2019-03-25 ENCOUNTER — Other Ambulatory Visit: Payer: Self-pay

## 2019-03-25 ENCOUNTER — Ambulatory Visit: Payer: Medicare Other

## 2019-03-25 DIAGNOSIS — M5416 Radiculopathy, lumbar region: Secondary | ICD-10-CM

## 2019-03-25 DIAGNOSIS — M25562 Pain in left knee: Secondary | ICD-10-CM

## 2019-03-25 DIAGNOSIS — M25561 Pain in right knee: Secondary | ICD-10-CM

## 2019-03-25 DIAGNOSIS — Z9181 History of falling: Secondary | ICD-10-CM

## 2019-03-25 DIAGNOSIS — G8929 Other chronic pain: Secondary | ICD-10-CM

## 2019-03-25 DIAGNOSIS — M6281 Muscle weakness (generalized): Secondary | ICD-10-CM

## 2019-03-25 DIAGNOSIS — M25551 Pain in right hip: Secondary | ICD-10-CM

## 2019-03-25 DIAGNOSIS — R42 Dizziness and giddiness: Secondary | ICD-10-CM

## 2019-03-25 NOTE — Therapy (Signed)
[facility] [address] [place], [address] Phone: [phone]   Fax:  [phone]  Physical Therapy Treatment  Patient Details  Name: Destiny Shaw MRN: [id number] Date of Birth: 1950-04-07 Referring Provider (PT): Paralee Cancel, MD   Encounter Date: 03/25/2019  PT End of Session - 03/25/19 1405    Visit Number  [id number]    Number of Visits  102    Date for PT Re-Evaluation  05/21/19    Authorization Type  2    Authorization Time Period  of 10 progress note Medicare    PT Start Time  1406    PT Stop Time  1446    PT Time Calculation (min)  40 min    Activity Tolerance  Patient tolerated treatment well    Behavior During Therapy  [facility] for tasks assessed/performed       Past Medical History:  Diagnosis Date  . ADD (attention deficit disorder)   . Anemia   . Anginal pain ([place])    pt states has occas chest pain relates to indigestion; pt uses rest to relieve   . Anxiety   . Arthritis   . Concussion   . Depression   . Diabetes mellitus without complication ([place])   . Dizziness   . Fall   . GERD (gastroesophageal reflux disease)   . Headache   . History of urinary tract infection   . Hyperlipidemia   . Hypertension   . Hypothyroidism   . IBS (irritable bowel syndrome)   . Imbalance   . Numbness    right leg   . Numbness in both hands    comes and goes   . Pneumonia    last episode approx 1 year ago   . Sleep apnea   . Wears glasses     Past Surgical History:  Procedure Laterality Date  . BREAST CYST ASPIRATION Left 1980's   neg  . BREAST CYST EXCISION Right 1980's   neg  . BREAST LUMPECTOMY Right   . BREAST SURGERY    . CARPAL TUNNEL RELEASE    . CESAREAN SECTION     times 2  . COLONOSCOPY WITH PROPOFOL N/A 05/11/2015   Procedure: COLONOSCOPY WITH PROPOFOL;  Surgeon: Manya Silvas, MD;  Location: [facility] ENDOSCOPY;  Service: Endoscopy;  Laterality: N/A;  . DE QUERVAIN'S RELEASE Right   .  ESOPHAGOGASTRODUODENOSCOPY (EGD) WITH PROPOFOL N/A 05/11/2015   Procedure: ESOPHAGOGASTRODUODENOSCOPY (EGD) WITH PROPOFOL;  Surgeon: Manya Silvas, MD;  Location: [facility] ENDOSCOPY;  Service: Endoscopy;  Laterality: N/A;  . EYE SURGERY     laser surgery bilat   . HERNIA REPAIR    . KNEE ARTHROSCOPY    . LUMBAR LAMINECTOMY/DECOMPRESSION MICRODISCECTOMY Bilateral 09/14/2015   Procedure: MICRO LUMBAR BILATERAL DECOMPRESSION L4 - L5;  Surgeon: Susa Day, MD;  Location: [facility] ORS;  Service: Orthopedics;  Laterality: Bilateral;  . REDUCTION MAMMAPLASTY Bilateral 1980  . RHINOPLASTY    . TONSILLECTOMY    . TOTAL KNEE ARTHROPLASTY Left 06/18/2016   Procedure: LEFT TOTAL KNEE ARTHROPLASTY;  Surgeon: Paralee Cancel, MD;  Location: [facility] ORS;  Service: Orthopedics;  Laterality: Left;  Adductor Block  . TUBAL LIGATION    . UVULOPALATOPHARYNGOPLASTY      There were no vitals filed for this visit.  Subjective Assessment - 03/25/19 1407    Subjective  The L knee is pretty good. Does not seem to hurt a lot, pretty normal. The x-ray of the L  knee looked good. 4/10 L knee stiffness and soreness (5/10 L knee pain at most for the past 7 days ) . Back is ok at the moment (7/10 back pain at most for the past 7 days). R hip and thigh has been bothering her as well (around the L4/L5 dermatome) since about 3 weeks ago gradually. Dizziness is not as bad or she is just getting used to it. She has not fallen.  R hip and knee pain 7/10 at most for the past month. Pt states that she wants to continue with to continue to improve ability to get into and out of the bath tub as well as to continue to work on her pain. Started to feel better therefore stopped the SLS hip exercises, then hip and knee started bothering her when she stopped doing. The epley maneuver helped and stopped doing it but still has slight dizziness when she stopped.  Could not make it to her last few appointments because she was quarantining.    Pertinent History   Low back pain. Pt states having back surgery about 2-3 years ago. After she had her L TKA, her back started aggravating her due to her walking. Pt also fell about 4 weeks ago onto her L knee. Dr. Alvan Dame checked out her L knee. L knee was fine but has some inflammation.  Pt tries to keep it iced. Pt still recovering for her L TKA but the fall set her back.   Her L knee surgery was last year.  L knee still bothers her a lot.  Had surgery for her back before her L knee surgery. Was doing well until her L knee surgery. The fall made it worse. Feels debilitating. Also has a hard time getting into and out of the car mainly due to her L knee.   Pt fell due to her L knee bucking on her. Pt was trying to pick something up from the side of the couch.  Also has a hard time getting up from the floor.  Pt states having bladder accidents since after her fall (pt was recommended to tell her MD about it).  Bowel problems since taking medications (was constapated, but after taking medications, pt had diarrhea which was difficult to control. Getting out of the metformin fixed the bowel issue).  Pt states tingling and numbness L lateral LE along the L5 dermatome.  Denies saddle anesthesia.  Pt states that her back problem is mainly on her L side.  Pt landed on her L knee when she fell.  No other falls within the last 6 months. Pt also states being very dizzy since her L knee surgery.  Had PT treatement for her dizziness before which did not help.  The room feels like it is spinning when she gets dizzy.  I feel like it is getting better then she does something.  5/10 low back soreness currently (7/10 at most for the past 7 days after negotiating stairs, 5/10 without doing stairs). 4/10 L knee pain currenglty with gait 5/10 L knee pain at most for the past 7 days.    Patient Stated Goals  Be better able to get into and out of her vehicle (midsized truck), into and out of chairs, be better able to roll in her bed with less L knee pain. Be  able to get down on the floor and get back up.     Currently in Pain?  Yes    Pain Score  6  R hip, R lateral thigh, R knee pain 6/10 currently   Pain Onset  More than a month ago                               PT Education - 03/25/19 1412    Education Details  ther-ex    Person(s) Educated  Patient    Methods  Explanation;Demonstration;Tactile cues;Verbal cues    Comprehension  Returned demonstration;Verbalized understanding         Objectives   MedbridgeAccess Code: [email or web address]  Latex free bands usedif used   Sitting posture: L trunk rotation  Standing posture: slightLlateral shift  Pt states that pressing her back against the chair feels better.  Gait observation: increased L lateral lean during L LE stance phase.    Worked R knee pain to help pt perform back and L knee exercises.     Therapeutic exercise   Standing L lateral shift correction 10x5 seconds for 2 sets  R Lateral thigh symptoms   Seated R trunk rotation 3x  R hip discomfort  seated L trunk rotation 10x5 seconds for 3 sets. Decreased discomfort R and L hip during gait afterwards  SLS with light touch assist to promote glute med strengthening  R 10x5 seconds for 3 sets  L 10x5 seconds for 3 sets  Reviewed importance of continuing her HEP to continue to help decrease pain and improve function secondary to pt reports of stopping her exercises. Pt verbalized understanding.     Reviewed plan of care: continued with PT 1x/week for 8 weeks   TTP L > R greater trochanter, try isometric glute med exercises next visit.   Seated manually resisted clamshell isometrics 10x5 seconds for 3 sets, hips less than 90 degrees flexion    Improved exercise technique, movement at target joints, use of target muscles after mod verbal, visual, tactile cues.     Response to treatment Pt tolerated session well without aggravation of symptoms. Pt  states R hip does not ache as bad, no L hip ache    Clinical impression Pt demonstrates improved ability to step into the bathtub compared to previous sessions based on subjective reports. Still demonstrates difficulty exiting the bath tub due to limited L knee flexion ROM. Slow progress. Challenges to progress include chronicity of condition, multiple pain areas, as well as inconsistency with performing her HEP. Pt education on importance of continuing to perform her exercises at home to promote progress in function and decrease knee, back pain, and improve balance to help address. Pt will benefit from continued skilled physical therapy to address the aforementioned deficits.      PT Short Term Goals - 12/12/17 0930      PT SHORT TERM GOAL #1   Title  Patient will be independent with her HEP to help decrease back and L knee pain and improve ability to perform functional tasks.     Time  3    Period  Weeks    Status  Achieved    Target Date  12/12/17        PT Long Term Goals - 03/25/19 1419      PT LONG TERM GOAL #1   Title  Patient will have a decrease in back pain to 3/10 or less at worst to promote ability to ambulate, turn in bed, perform standing tasks with less pain.     Baseline  6/10 back pain at most (09/24/2017); 7-8/10 at  most for the past 7 days, duration of pain is better since starting PT (10/29/2017); 5-6/10 at most for the past 7 days (11/18/2017); 8/10 at most for the past 7 days but the duration of pain is less compared to prior to starting PT (12/12/2017).  7/10 back pain at most for the past 7 days (pt states doing her exercises helps decrease her pain; 01/02/2018); walking (3/10), standing (4/10), bed mobility (0/10) 02/05/2018; 6/10 back pain at most for the past 7 days (03/03/2018); 6.5/10 back pain at most for the past 7 days (03/27/2018); 7-8/10 at most (05/08/2018); 7/10 back pain at worst (07/22/2018);  7-8/10 at most for the past 7 days (09/22/2018); 5-7/10 at worst for the  past 7 days then calms down (10/27/2018); 6/10 back pain at most for the past 7 days (when pt cooks and cleans up in kitchen; 12/17/2018); 6-7/10 back spams pain at most for the past 7 days, usually when she is doing stuff in the kitchen (01/29/2019); 7/10 at worst for the past 7 days (03/25/2019)    Time  8    Period  Weeks    Status  On-going    Target Date  05/21/19      PT LONG TERM GOAL #2   Title  Patient will have a decrease in L knee pain to 3/10 or less at worst to promote ability to ambulate, negotiate stairs, get into and out of a car more comfortably.     Baseline  6/10 L knee pain at most for the past 3 months (09/24/2017); 7-8/10 at most for the past 7 days, duration of pain is better since starting PT (10/29/2017); 7/10 at most for the past 7 days (11/18/2017); 6/10 L knee pain at most for the past 7 days (12/12/2017); 5/10 L knee pain at most for the past 7 days (pt states doing her exercises helps decrease pain; 01/02/2018); L knee 3/10 (02/05/2018); 4/10 at most for the past 7 days (03/03/2018); 5.5/10 L knee at most for the past 7 days (03/27/2018); 6-7/10 L knee pain at most (05/08/2018); 7/10 L knee pain at worst for the past month (07/22/2018);  6/10 L knee pain at most for the past 7 days (09/22/2018); 5-6/10 at most for the past 7 days sporadically, can get it better with exercise (10/27/2018); 6/10 L knee ache at most for the past 7 days (12/17/2018); 5-6/10 L knee pain at most for the past 7 days such as going up and down steps (01/29/2019); 5/10 at most for the past 7 days (03/25/2019)    Time  8    Period  Weeks    Status  On-going    Target Date  05/21/19      PT LONG TERM GOAL #3   Title  Patient will improve TUG time to 12 seconds or less as a demonstration of improved functional mobility and balance.     Baseline  TUG no AD: 16.05 seconds on average (09/24/2017); 14.3 seconds on average (10/29/2017); 13 seconds average (11/21/2017); 16.67 seconds average (12/12/2017);  14.17 seconds  average (01/02/2018); 11.8 sec sonds (02/05/2018)    Time  6    Period  Weeks    Status  Achieved      PT LONG TERM GOAL #4   Title  Patient will improve her back FOTO score by at least 10 points as a demonstration of improved funtion.      Baseline  Back FOTO: 33 (09/24/2017); 38 (10/29/2017); 36 (11/21/2017); 40 (12/12/2017); 42 (01/02/2018);  62 (03/03/2018)    Time  6    Period  Weeks    Status  Achieved      PT LONG TERM GOAL #5   Title  Patient will improve bilateral LE strength by at least 1/2 MMT grade to promote ability to perform standing tasks.     Time  6    Period  Weeks    Status  Achieved      Additional Long Term Goals   Additional Long Term Goals  Yes      PT LONG TERM GOAL #6   Title  Patient will improve her Modified Oswestry Low Back pain disablity questionnaire by at least 10% as a demonstration of improved function.     Baseline  48% (09/24/2017); 46% (10/29/2017), (11/21/2017); 56% (12/12/2017); 36% (01/02/2018), (03/03/2018)    Time  6    Period  Weeks    Status  Achieved      PT LONG TERM GOAL #7   Title  Pt will report decreased difficulty stepping into and out of her bathtub as well as getting into and out of her truck to promote ability to get into and out of places.     Baseline  Pt reports difficulty stepping into and out of her bathtub as well as difficulty getting into and out of her truck (01/02/2018); Pt states the truck and bath tub and difficult and painful.  (02/05/2018); Improving per subjective reports (03/03/2018); decreased difficulty with truck tranfser after cues for technique and practice, decreasing difficulty with stepping into and out of the bath tub based on subjective reports (03/27/2018 and 04/01/2018); Decreasing difficulty stepping into a bathtub. No difficulty getting into and out of her truck (05/08/2018), (07/22/2018); Difficulty stepping into and out of the bath tub. No difficulty with truck transfers (09/22/2018); Able to get into her bath tub, difficulty  getting out (10/27/18); Still has difficulty getting into and out of bathtub (02/23/2019); Better able to step into the bathtub. Still has difficulty stepping out of the bathtub due to knee flexion (03/25/2019)    Time  8    Period  Weeks    Status  Partially Met    Target Date  05/21/19      PT LONG TERM GOAL #8   Title  Patient will improve her BERG balance test score to 46/56 or more as a demonstration of decreased fall risk     Baseline  42/56 (01/02/2018); 54/56 (02/05/2018)    Time  6    Period  Weeks    Status  Achieved      PT LONG TERM GOAL  #9   TITLE  Pt will report minimal to no dizziness related to vertigo to promote balance and decrease fall risk.     Baseline  Pt reports dizziness (related to vertigo) which seem to affect her balance (01/15/2018); Patient reports "a little bit every now and then" (02/05/2018); Pt reports dizziness and decreased balance (01/29/2019); Dizziness has improved some (02/23/2019), (03/25/2019)    Time  8    Period  Weeks    Status  On-going    Target Date  05/21/19      PT LONG TERM GOAL  #10   TITLE  Pt will be able to turn 360 degrees to the R and L at least 3 times without dizziness and no LOB to promote safety and decrease fall risk.    Baseline  360 degree turn to the R and L causes a loss of  balance (12/17/2018), (01/29/2019). able to perform 3 times with each side with improved balance, minimal dizziness. (02/23/2019)    Time  8    Period  Weeks    Status  Partially Met    Target Date  05/21/19      PT LONG TERM GOAL  #11   TITLE  Patient will have a decrease in R hip and knee pain to 4/10 or less at worst to promote ability to ambulate with less discomfort.    Baseline  7/10 R hip and knee pain at worst for the past month (03/25/2019)    Time  8    Period  Weeks    Status  New    Target Date  05/21/19            Plan - 03/25/19 1416    Clinical Impression Statement  Pt demonstrates improved ability to step into the bathtub  compared to previous sessions based on subjective reports. Still demonstrates difficulty exiting the bath tub due to limited L knee flexion ROM. Slow progress. Challenges to progress include chronicity of condition, multiple pain areas, as well as inconsistency with performing her HEP. Pt education on importance of continuing to perform her exercises at home to promote progress in function and decrease knee, back pain, and improve balance to help address. Pt will benefit from continued skilled physical therapy to address the aforementioned deficits.    Personal Factors and Comorbidities  Age;Comorbidity 3+;Time since onset of injury/illness/exacerbation;Fitness;Past/Current Experience    Comorbidities  Back, knee pain, hx of dizziness, hx of back surgery, hx of C-section    Examination-Activity Limitations  Squat    Stability/Clinical Decision Making  Evolving/Moderate complexity    Clinical Decision Making  Moderate    Clinical Presentation due to:  R hip and knee pain recent occurence    Rehab Potential  Fair    Clinical Impairments Affecting Rehab Potential  difficulty with carry-over of improvements in clinic, chronicity of condition, age, multiple problem areas    PT Frequency  1x / week    PT Duration  8 weeks    PT Treatment/Interventions  Therapeutic activities;Therapeutic exercise;Balance training;Neuromuscular re-education;Patient/family education;Manual techniques;Dry needling;Aquatic Therapy;Electrical Stimulation;Iontophoresis 68m/ml Dexamethasone;Gait training;Canalith Repostioning;Vestibular    PT Next Visit Plan   hip and knee strengthening, core strengthening, patellar mobility, manual techniques, modalities PRN    Consulted and Agree with Plan of Care  Patient       Patient will benefit from skilled therapeutic intervention in order to improve the following deficits and impairments:  Pain, Improper body mechanics, Postural dysfunction, Dizziness, Decreased strength, Difficulty  walking, Decreased balance  Visit Diagnosis: Chronic pain of left knee - Plan: PT plan of care cert/re-cert  Muscle weakness (generalized) - Plan: PT plan of care cert/re-cert  Radiculopathy, lumbar region - Plan: PT plan of care cert/re-cert  History of falling - Plan: PT plan of care cert/re-cert  Dizziness and giddiness - Plan: PT plan of care cert/re-cert  Right knee pain, unspecified chronicity - Plan: PT plan of care cert/re-cert  Pain in right hip - Plan: PT plan of care cert/re-cert     Problem List Patient Active Problem List   Diagnosis Date Noted  . Obese 06/20/2016  . Status post right knee replacement 06/19/2016  . S/P left TKA 06/18/2016  . Spinal stenosis of lumbar region 09/14/2015    MJoneen BoersPT, DPT   03/25/2019, 7:54 PM  [facility] [address] [place], [address] Phone: [phone]   Fax:  [phone]  Name: Destiny Shaw MRN: [id number] Date of Birth: [birth year]

## 2019-04-01 ENCOUNTER — Ambulatory Visit: Payer: Medicare Other

## 2019-04-08 ENCOUNTER — Other Ambulatory Visit: Payer: Self-pay

## 2019-04-08 ENCOUNTER — Ambulatory Visit: Payer: Medicare Other | Attending: Orthopedic Surgery

## 2019-04-08 DIAGNOSIS — M5416 Radiculopathy, lumbar region: Secondary | ICD-10-CM | POA: Diagnosis present

## 2019-04-08 DIAGNOSIS — M25551 Pain in right hip: Secondary | ICD-10-CM

## 2019-04-08 DIAGNOSIS — M25561 Pain in right knee: Secondary | ICD-10-CM | POA: Diagnosis present

## 2019-04-08 DIAGNOSIS — Z9181 History of falling: Secondary | ICD-10-CM | POA: Diagnosis present

## 2019-04-08 DIAGNOSIS — R42 Dizziness and giddiness: Secondary | ICD-10-CM | POA: Insufficient documentation

## 2019-04-08 DIAGNOSIS — M6281 Muscle weakness (generalized): Secondary | ICD-10-CM | POA: Diagnosis present

## 2019-04-08 DIAGNOSIS — G8929 Other chronic pain: Secondary | ICD-10-CM | POA: Diagnosis present

## 2019-04-08 DIAGNOSIS — M25562 Pain in left knee: Secondary | ICD-10-CM | POA: Diagnosis not present

## 2019-04-08 NOTE — Therapy (Signed)
[facility] [address] [place], [address] Phone: [phone]   Fax:  [phone]  Physical Therapy Treatment  Patient Details  Name: Destiny Shaw MRN: [id number] Date of Birth: 1950-03-25 Referring Provider (PT): Paralee Cancel, MD   Encounter Date: 04/08/2019  PT End of Session - 04/08/19 1302    Visit Number  12    Number of Visits  102    Date for PT Re-Evaluation  05/21/19    Authorization Type  3    Authorization Time Period  of 10 progress note Medicare    PT Start Time  1302    PT Stop Time  1346    PT Time Calculation (min)  44 min    Activity Tolerance  Patient tolerated treatment well    Behavior During Therapy  [facility] for tasks assessed/performed       Past Medical History:  Diagnosis Date  . ADD (attention deficit disorder)   . Anemia   . Anginal pain ([place])    pt states has occas chest pain relates to indigestion; pt uses rest to relieve   . Anxiety   . Arthritis   . Concussion   . Depression   . Diabetes mellitus without complication ([place])   . Dizziness   . Fall   . GERD (gastroesophageal reflux disease)   . Headache   . History of urinary tract infection   . Hyperlipidemia   . Hypertension   . Hypothyroidism   . IBS (irritable bowel syndrome)   . Imbalance   . Numbness    right leg   . Numbness in both hands    comes and goes   . Pneumonia    last episode approx 1 year ago   . Sleep apnea   . Wears glasses     Past Surgical History:  Procedure Laterality Date  . BREAST CYST ASPIRATION Left 1980's   neg  . BREAST CYST EXCISION Right 1980's   neg  . BREAST LUMPECTOMY Right   . BREAST SURGERY    . CARPAL TUNNEL RELEASE    . CESAREAN SECTION     times 2  . COLONOSCOPY WITH PROPOFOL N/A 05/11/2015   Procedure: COLONOSCOPY WITH PROPOFOL;  Surgeon: Manya Silvas, MD;  Location: [facility] ENDOSCOPY;  Service: Endoscopy;  Laterality: N/A;  . DE QUERVAIN'S RELEASE Right   .  ESOPHAGOGASTRODUODENOSCOPY (EGD) WITH PROPOFOL N/A 05/11/2015   Procedure: ESOPHAGOGASTRODUODENOSCOPY (EGD) WITH PROPOFOL;  Surgeon: Manya Silvas, MD;  Location: [facility] ENDOSCOPY;  Service: Endoscopy;  Laterality: N/A;  . EYE SURGERY     laser surgery bilat   . HERNIA REPAIR    . KNEE ARTHROSCOPY    . LUMBAR LAMINECTOMY/DECOMPRESSION MICRODISCECTOMY Bilateral 09/14/2015   Procedure: MICRO LUMBAR BILATERAL DECOMPRESSION L4 - L5;  Surgeon: Susa Day, MD;  Location: [facility] ORS;  Service: Orthopedics;  Laterality: Bilateral;  . REDUCTION MAMMAPLASTY Bilateral 1980  . RHINOPLASTY    . TONSILLECTOMY    . TOTAL KNEE ARTHROPLASTY Left 06/18/2016   Procedure: LEFT TOTAL KNEE ARTHROPLASTY;  Surgeon: Paralee Cancel, MD;  Location: [facility] ORS;  Service: Orthopedics;  Laterality: Left;  Adductor Block  . TUBAL LIGATION    . UVULOPALATOPHARYNGOPLASTY      There were no vitals filed for this visit.  Subjective Assessment - 04/08/19 1303    Subjective  Doing ok. R knee is giving her some issues. The L knee seems to be not bothering her (a little  tender). R knee joint seems to bother her. The back seems to be ok. Contrating more on her R knee instead of her back. R low back bothered her this morning while she was in the kitchen. Cooked and did dishes for an hour in the kitchen.  The dizziness has gotten much worse lately. Feels a little lightheaded.  Bending over: the carpet looks like its moving side to side, a little wavy.    Pertinent History  Low back pain. Pt states having back surgery about 2-3 years ago. After she had her L TKA, her back started aggravating her due to her walking. Pt also fell about 4 weeks ago onto her L knee. Dr. Alvan Dame checked out her L knee. L knee was fine but has some inflammation.  Pt tries to keep it iced. Pt still recovering for her L TKA but the fall set her back.   Her L knee surgery was last year.  L knee still bothers her a lot.  Had surgery for her back before her L knee surgery. Was  doing well until her L knee surgery. The fall made it worse. Feels debilitating. Also has a hard time getting into and out of the car mainly due to her L knee.   Pt fell due to her L knee bucking on her. Pt was trying to pick something up from the side of the couch.  Also has a hard time getting up from the floor.  Pt states having bladder accidents since after her fall (pt was recommended to tell her MD about it).  Bowel problems since taking medications (was constapated, but after taking medications, pt had diarrhea which was difficult to control. Getting out of the metformin fixed the bowel issue).  Pt states tingling and numbness L lateral LE along the L5 dermatome.  Denies saddle anesthesia.  Pt states that her back problem is mainly on her L side.  Pt landed on her L knee when she fell.  No other falls within the last 6 months. Pt also states being very dizzy since her L knee surgery.  Had PT treatement for her dizziness before which did not help.  The room feels like it is spinning when she gets dizzy.  I feel like it is getting better then she does something.  5/10 low back soreness currently (7/10 at most for the past 7 days after negotiating stairs, 5/10 without doing stairs). 4/10 L knee pain currenglty with gait 5/10 L knee pain at most for the past 7 days.    Patient Stated Goals  Be better able to get into and out of her vehicle (midsized truck), into and out of chairs, be better able to roll in her bed with less L knee pain. Be able to get down on the floor and get back up.     Currently in Pain?  Yes    Pain Score  6    R knee pain when walking   Pain Onset  More than a month ago                               PT Education - 04/08/19 1309    Education Details  ther-ex    [employer]) Educated  Patient    Methods  Explanation;Demonstration;Tactile cues;Verbal cues    Comprehension  Returned demonstration;Verbalized understanding       Objectives   MedbridgeAccess Code: [email or web address]  Latex free bands usedif  used   Sitting posture: L trunk rotation  Standing posture: slightLlateral shift  Pt states that pressing her back against the chair feels better.  Gait observation: increased L lateral lean during L LE stance phase.    Worked R knee pain to help pt perform back and L knee exercises.   Manual thereapy  Seated STM fascial area R medial knee Seated STM L hip adductors distally  Decreased R knee pain with R knee flexion afterwards    Canalith repositioning  (+) Dix Hallpike on L side  Epley maneuver for L side 1x   Symptoms elicited but subsided   No symptoms with bending over to fix her shoe afterwards. Pt states feeling normal     Therapeutic exercise   Seated R knee flexion causes R medial knee pain Seated R knee flexion after manual therapy: decrease pain, improved comfort  Blood pressure L arm sitting, mechanically taken, normal cuff: 134/74, HR 68 SpO2: 100%, room air   Seated L knee flexion AAROM with PT 25x with PT manual pressure to medial knee fascial tissue to help decrease sittness  Improved L knee flexion comfort level reported.        Improved exercise technique, movement at target joints, use of target muscles after mod verbal, visual, tactile cues.     Response to treatment Pt tolerated session well without aggravation of symptoms. Pt states R hip does not ache as bad, no L hip ache    Clinical impression Pt demonstrates decreased fascial mobility R and L medial knee in which treatment to decrease restrictions improved ability to perform knee flexion AROM more comfortably. Pt also demonstrates positive [employer] test for L ear with reproduction of symptoms. Canalith repositioning technique (Epley maneuver) for L ear performed with alleviated symptoms when bending over. Pt was recommended to continue performing her HEP such  as the Epley maneuver to alleviate her symptoms secondary to pt reporting not performing them due to discomfort with R knee pain and vertigo. Pt will benefit from continued skilled physical therapy services to decrease pain, dizziness, improve strength, balance and function.     PT Short Term Goals - 12/12/17 0930      PT SHORT TERM GOAL #1   Title  Patient will be independent with her HEP to help decrease back and L knee pain and improve ability to perform functional tasks.     Time  3    Period  Weeks    Status  Achieved    Target Date  12/12/17        PT Long Term Goals - 03/25/19 1419      PT LONG TERM GOAL #1   Title  Patient will have a decrease in back pain to 3/10 or less at worst to promote ability to ambulate, turn in bed, perform standing tasks with less pain.     Baseline  6/10 back pain at most (09/24/2017); 7-8/10 at most for the past 7 days, duration of pain is better since starting PT (10/29/2017); 5-6/10 at most for the past 7 days (11/18/2017); 8/10 at most for the past 7 days but the duration of pain is less compared to prior to starting PT (12/12/2017).  7/10 back pain at most for the past 7 days (pt states doing her exercises helps decrease her pain; 01/02/2018); walking (3/10), standing (4/10), bed mobility (0/10) 02/05/2018; 6/10 back pain at most for the past 7 days (03/03/2018); 6.5/10 back pain at most for the past 7 days (  03/27/2018); 7-8/10 at most (05/08/2018); 7/10 back pain at worst (07/22/2018);  7-8/10 at most for the past 7 days (09/22/2018); 5-7/10 at worst for the past 7 days then calms down (10/27/2018); 6/10 back pain at most for the past 7 days (when pt cooks and cleans up in kitchen; 12/17/2018); 6-7/10 back spams pain at most for the past 7 days, usually when she is doing stuff in the kitchen (01/29/2019); 7/10 at worst for the past 7 days (03/25/2019)    Time  8    Period  Weeks    Status  On-going    Target Date  05/21/19      PT LONG TERM GOAL #2   Title   Patient will have a decrease in L knee pain to 3/10 or less at worst to promote ability to ambulate, negotiate stairs, get into and out of a car more comfortably.     Baseline  6/10 L knee pain at most for the past 3 months (09/24/2017); 7-8/10 at most for the past 7 days, duration of pain is better since starting PT (10/29/2017); 7/10 at most for the past 7 days (11/18/2017); 6/10 L knee pain at most for the past 7 days (12/12/2017); 5/10 L knee pain at most for the past 7 days (pt states doing her exercises helps decrease pain; 01/02/2018); L knee 3/10 (02/05/2018); 4/10 at most for the past 7 days (03/03/2018); 5.5/10 L knee at most for the past 7 days (03/27/2018); 6-7/10 L knee pain at most (05/08/2018); 7/10 L knee pain at worst for the past month (07/22/2018);  6/10 L knee pain at most for the past 7 days (09/22/2018); 5-6/10 at most for the past 7 days sporadically, can get it better with exercise (10/27/2018); 6/10 L knee ache at most for the past 7 days (12/17/2018); 5-6/10 L knee pain at most for the past 7 days such as going up and down steps (01/29/2019); 5/10 at most for the past 7 days (03/25/2019)    Time  8    Period  Weeks    Status  On-going    Target Date  05/21/19      PT LONG TERM GOAL #3   Title  Patient will improve TUG time to 12 seconds or less as a demonstration of improved functional mobility and balance.     Baseline  TUG no AD: 16.05 seconds on average (09/24/2017); 14.3 seconds on average (10/29/2017); 13 seconds average (11/21/2017); 16.67 seconds average (12/12/2017);  14.17 seconds average (01/02/2018); 11.8 sec sonds (02/05/2018)    Time  6    Period  Weeks    Status  Achieved      PT LONG TERM GOAL #4   Title  Patient will improve her back FOTO score by at least 10 points as a demonstration of improved funtion.      Baseline  Back FOTO: 33 (09/24/2017); 38 (10/29/2017); 36 (11/21/2017); 40 (12/12/2017); 42 (01/02/2018); 47 (03/03/2018)    Time  6    Period  Weeks    Status  Achieved       PT LONG TERM GOAL #5   Title  Patient will improve bilateral LE strength by at least 1/2 MMT grade to promote ability to perform standing tasks.     Time  6    Period  Weeks    Status  Achieved      Additional Long Term Goals   Additional Long Term Goals  Yes      PT LONG  TERM GOAL #6   Title  Patient will improve her Modified Oswestry Low Back pain disablity questionnaire by at least 10% as a demonstration of improved function.     Baseline  48% (09/24/2017); 46% (10/29/2017), (11/21/2017); 56% (12/12/2017); 36% (01/02/2018), (03/03/2018)    Time  6    Period  Weeks    Status  Achieved      PT LONG TERM GOAL #7   Title  Pt will report decreased difficulty stepping into and out of her bathtub as well as getting into and out of her truck to promote ability to get into and out of places.     Baseline  Pt reports difficulty stepping into and out of her bathtub as well as difficulty getting into and out of her truck (01/02/2018); Pt states the truck and bath tub and difficult and painful.  (02/05/2018); Improving per subjective reports (03/03/2018); decreased difficulty with truck tranfser after cues for technique and practice, decreasing difficulty with stepping into and out of the bath tub based on subjective reports (03/27/2018 and 04/01/2018); Decreasing difficulty stepping into a bathtub. No difficulty getting into and out of her truck (05/08/2018), (07/22/2018); Difficulty stepping into and out of the bath tub. No difficulty with truck transfers (09/22/2018); Able to get into her bath tub, difficulty getting out (10/27/18); Still has difficulty getting into and out of bathtub (02/23/2019); Better able to step into the bathtub. Still has difficulty stepping out of the bathtub due to knee flexion (03/25/2019)    Time  8    Period  Weeks    Status  Partially Met    Target Date  05/21/19      PT LONG TERM GOAL #8   Title  Patient will improve her BERG balance test score to 46/56 or more as a demonstration of  decreased fall risk     Baseline  42/56 (01/02/2018); 54/56 (02/05/2018)    Time  6    Period  Weeks    Status  Achieved      PT LONG TERM GOAL  #9   TITLE  Pt will report minimal to no dizziness related to vertigo to promote balance and decrease fall risk.     Baseline  Pt reports dizziness (related to vertigo) which seem to affect her balance (01/15/2018); Patient reports "a little bit every now and then" (02/05/2018); Pt reports dizziness and decreased balance (01/29/2019); Dizziness has improved some (02/23/2019), (03/25/2019)    Time  8    Period  Weeks    Status  On-going    Target Date  05/21/19      PT LONG TERM GOAL  #10   TITLE  Pt will be able to turn 360 degrees to the R and L at least 3 times without dizziness and no LOB to promote safety and decrease fall risk.    Baseline  360 degree turn to the R and L causes a loss of balance (12/17/2018), (01/29/2019). able to perform 3 times with each side with improved balance, minimal dizziness. (02/23/2019)    Time  8    Period  Weeks    Status  Partially Met    Target Date  05/21/19      PT LONG TERM GOAL  #11   TITLE  Patient will have a decrease in R hip and knee pain to 4/10 or less at worst to promote ability to ambulate with less discomfort.    Baseline  7/10 R hip and knee pain at worst  for the past month (03/25/2019)    Time  8    Period  Weeks    Status  New    Target Date  05/21/19            Plan - 04/08/19 1309    Clinical Impression Statement  Pt demonstrates decreased fascial mobility R and L medial knee in which treatment to decrease restrictions improved ability to perform knee flexion AROM more comfortably. Pt also demonstrates positive [employer] test for L ear with reproduction of symptoms. Canalith repositioning technique (Epley maneuver) for L ear performed with alleviated symptoms when bending over. Pt was recommended to continue performing her HEP such as the Epley maneuver to alleviate her symptoms  secondary to pt reporting not performing them due to discomfort with R knee pain and vertigo. Pt will benefit from continued skilled physical therapy services to decrease pain, dizziness, improve strength, balance and function.    Personal Factors and Comorbidities  Age;Comorbidity 3+;Time since onset of injury/illness/exacerbation;Fitness;Past/Current Experience    Comorbidities  Back, knee pain, hx of dizziness, hx of back surgery, hx of C-section    Examination-Activity Limitations  Squat    Stability/Clinical Decision Making  Evolving/Moderate complexity    Rehab Potential  Fair    Clinical Impairments Affecting Rehab Potential  difficulty with carry-over of improvements in clinic, chronicity of condition, age, multiple problem areas    PT Frequency  1x / week    PT Duration  8 weeks    PT Treatment/Interventions  Therapeutic activities;Therapeutic exercise;Balance training;Neuromuscular re-education;Patient/family education;Manual techniques;Dry needling;Aquatic Therapy;Electrical Stimulation;Iontophoresis 71m/ml Dexamethasone;Gait training;Canalith Repostioning;Vestibular    PT Next Visit Plan   hip and knee strengthening, core strengthening, patellar mobility, manual techniques, modalities PRN    Consulted and Agree with Plan of Care  Patient       Patient will benefit from skilled therapeutic intervention in order to improve the following deficits and impairments:  Pain, Improper body mechanics, Postural dysfunction, Dizziness, Decreased strength, Difficulty walking, Decreased balance  Visit Diagnosis: Chronic pain of left knee  Muscle weakness (generalized)  Radiculopathy, lumbar region  History of falling  Dizziness and giddiness  Right knee pain, unspecified chronicity  Pain in right hip     Problem List Patient Active Problem List   Diagnosis Date Noted  . Obese 06/20/2016  . Status post right knee replacement 06/19/2016  . S/P left TKA 06/18/2016  . Spinal  stenosis of lumbar region 09/14/2015    MJoneen BoersPT, DPT   04/08/2019, 8:07 PM  [facility] [address] [place] [address]Phone: [phone]  Fax:  [phone] Name: Destiny SATREMRN: [id number] of Birth: [birth year]

## 2019-04-15 ENCOUNTER — Ambulatory Visit: Payer: Medicare Other

## 2019-04-15 ENCOUNTER — Other Ambulatory Visit: Payer: Self-pay

## 2019-04-15 DIAGNOSIS — R42 Dizziness and giddiness: Secondary | ICD-10-CM

## 2019-04-15 DIAGNOSIS — M5416 Radiculopathy, lumbar region: Secondary | ICD-10-CM

## 2019-04-15 DIAGNOSIS — M25551 Pain in right hip: Secondary | ICD-10-CM

## 2019-04-15 DIAGNOSIS — G8929 Other chronic pain: Secondary | ICD-10-CM

## 2019-04-15 DIAGNOSIS — M25562 Pain in left knee: Secondary | ICD-10-CM | POA: Diagnosis not present

## 2019-04-15 DIAGNOSIS — M25561 Pain in right knee: Secondary | ICD-10-CM

## 2019-04-15 DIAGNOSIS — M6281 Muscle weakness (generalized): Secondary | ICD-10-CM

## 2019-04-15 DIAGNOSIS — Z9181 History of falling: Secondary | ICD-10-CM

## 2019-04-15 NOTE — Patient Instructions (Signed)
Access Code: [email or web address]  URL: [email or web address]  Date: 04/15/2019  Prepared by: Joneen Boers   Exercises Seated Hip Adduction Isometrics with Diona Foley - 10 reps - 2 sets - 5 seconds hold - 1x daily - 7x weekly Seated Gluteal Sets - 10 reps - 3 sets - 5 seconds hold - 1x daily - 7x weekly Standing Bilateral Gastroc Stretch with Step - 5 reps - 1 sets - 20 seconds hold - 2x daily - 7x weekly Seated Long Arc Quad with Ankle Weight - 10 reps - 3 sets - 5 seconds hold - 1x daily - 7x weekly Bent Knee Fallouts - 10 reps - 3 sets - 1x daily - 7x weekly Seated Posterior Pelvic Tilt - 10 reps - 3 sets - 1x daily - 7x weekly                  Seated Flexion Stretch with Swiss Ball - 10 reps - 3 sets - 5 seconds hold - 1x daily - 7x weekly Lower Trunk Rotations - 10 reps - 3 sets - 1x daily - 7x weekly Shoulder Adduction with Anchored Resistance - 10 reps - 2 sets - 5 seconds hold - 1x daily - 7x weekly Scapular Retraction with Resistance - 10 reps - 3 sets - 5 seconds hold - 1x daily - 7x weekly Seated Heel Slide - 10 reps - 3 sets - 5 seconds hold - 1x daily - 7x weekly Seated Cervical Retraction - 10 reps - 3 sets - 5 secibds hold - 1x daily - 7x weekly Gastroc Stretch on Wall - 5 reps - 3 sets - 15 seconds hold - 1x daily - 7x weekly Sidelying Hip Abduction - 8 reps - 3 sets - 1x daily - 7x weekly Seated Knee Flexion Extension AROM - 10 reps - 3 sets - 5 seconds hold - 3x daily - 7x weekly Standing Partial Lunge - 10 reps - 2 sets - 1x daily - 7x weekly Standing Knee Flexion Stretch on Step - 10 reps - 2 sets - 5 hold - 1x daily - 7x weekly Seated Sidebending - 10 reps - 3 sets - 5 seconds hold - 1x daily - 7x weekly Seated Heel Toe Raises - 10 reps - 3 sets - 1x daily - 7x weekly

## 2019-04-15 NOTE — Therapy (Signed)
[facility] [address], [place], [address] Phone: [phone]   Fax:  [phone]  Physical Therapy Treatment  Patient Details  Name: Destiny Shaw, Destiny Shaw Referring Provider (PT): Paralee Cancel, MD   Encounter Date: 04/15/2019  PT End of Session - 04/15/19 1302    Visit Number  29    Number of Visits  102    Date for PT Re-Evaluation  05/21/19    Authorization Type  4    Authorization Time Period  of 10 progress note Medicare    PT Start Time  1303    PT Stop Time  1344    PT Time Calculation (min)  41 min    Activity Tolerance  Patient tolerated treatment well    Behavior During Therapy  [facility] for tasks assessed/performed       Past Medical History:  Diagnosis Date  . ADD (attention deficit disorder)   . Anemia   . Anginal pain ([place])    pt states has occas chest pain relates to indigestion; pt uses rest to relieve   . Anxiety   . Arthritis   . Concussion   . Depression   . Diabetes mellitus without complication ([place])   . Dizziness   . Fall   . GERD (gastroesophageal reflux disease)   . Headache   . History of urinary tract infection   . Hyperlipidemia   . Hypertension   . Hypothyroidism   . IBS (irritable bowel syndrome)   . Imbalance   . Numbness    right leg   . Numbness in both hands    comes and goes   . Pneumonia    last episode approx 1 year ago   . Sleep apnea   . Wears glasses     Past Surgical History:  Procedure Laterality Date  . BREAST CYST ASPIRATION Left 1980's   neg  . BREAST CYST EXCISION Right 1980's   neg  . BREAST LUMPECTOMY Right   . BREAST SURGERY    . CARPAL TUNNEL RELEASE    . CESAREAN SECTION     times 2  . COLONOSCOPY WITH PROPOFOL N/A 05/11/2015   Procedure: COLONOSCOPY WITH PROPOFOL;  Surgeon: Manya Silvas, MD;  Location: [facility] ENDOSCOPY;  Service: Endoscopy;  Laterality: N/A;  . DE QUERVAIN'S RELEASE Right   .  ESOPHAGOGASTRODUODENOSCOPY (EGD) WITH PROPOFOL N/A 05/11/2015   Procedure: ESOPHAGOGASTRODUODENOSCOPY (EGD) WITH PROPOFOL;  Surgeon: Manya Silvas, MD;  Location: [facility] ENDOSCOPY;  Service: Endoscopy;  Laterality: N/A;  . EYE SURGERY     laser surgery bilat   . HERNIA REPAIR    . KNEE ARTHROSCOPY    . LUMBAR LAMINECTOMY/DECOMPRESSION MICRODISCECTOMY Bilateral 7/Shaw/2017   Procedure: MICRO LUMBAR BILATERAL DECOMPRESSION L4 - L5;  Surgeon: Susa Day, MD;  Location: [facility] ORS;  Service: Orthopedics;  Laterality: Bilateral;  . REDUCTION MAMMAPLASTY Bilateral 1980  . RHINOPLASTY    . TONSILLECTOMY    . TOTAL KNEE ARTHROPLASTY Left 06/18/2016   Procedure: LEFT TOTAL KNEE ARTHROPLASTY;  Surgeon: Paralee Cancel, MD;  Location: [facility] ORS;  Service: Orthopedics;  Laterality: Left;  Adductor Block  . TUBAL LIGATION    . UVULOPALATOPHARYNGOPLASTY      There were no vitals filed for this visit.  Subjective Assessment - 04/15/19 1304    Subjective  R medial and R anterior thigh has been bothering her for the last few days. Feels like an ache. Hervey Ard  pain in R knee joint. Currently has difficulty getting into and out of the car due to her R LE. Does not remember falling on it. Has been unsteady a lot.  Dizziness is not a whole lot better.  L knee is doing ok.    Pertinent History  Low back pain. Pt states having back surgery about 2-3 years ago. After she had her L TKA, her back started aggravating her due to her walking. Pt also fell about 4 weeks ago onto her L knee. Dr. Alvan Dame checked out her L knee. L knee was fine but has some inflammation.  Pt tries to keep it iced. Pt still recovering for her L TKA but the fall set her back.   Her L knee surgery was last year.  L knee still bothers her a lot.  Had surgery for her back before her L knee surgery. Was doing well until her L knee surgery. The fall made it worse. Feels debilitating. Also has a hard time getting into and out of the car mainly due to her L knee.   Pt  fell due to her L knee bucking on her. Pt was trying to pick something up from the side of the couch.  Also has a hard time getting up from the floor.  Pt states having bladder accidents since after her fall (pt was recommended to tell her MD about it).  Bowel problems since taking medications (was constapated, but after taking medications, pt had diarrhea which was difficult to control. Getting out of the metformin fixed the bowel issue).  Pt states tingling and numbness L lateral LE along the L5 dermatome.  Denies saddle anesthesia.  Pt states that her back problem is mainly on her L side.  Pt landed on her L knee when she fell.  No other falls within the last 6 months. Pt also states being very dizzy since her L knee surgery.  Had PT treatement for her dizziness before which did not help.  The room feels like it is spinning when she gets dizzy.  I feel like it is getting better then she does something.  5/10 low back soreness currently (7/10 at most for the past 7 days after negotiating stairs, 5/10 without doing stairs). 4/10 L knee pain currenglty with gait 5/10 L knee pain at most for the past 7 days.    Patient Stated Goals  Be better able to get into and out of her vehicle (midsized truck), into and out of chairs, be better able to roll in her bed with less L knee pain. Be able to get down on the floor and get back up.     Currently in Pain?  Yes    Pain Score  6     Pain Location  Knee    Pain Orientation  Right    Pain Onset  More than a month ago                               PT Education - 04/15/19 1320    Education Details  ther-ex    [employer]) Educated  Patient    Methods  Explanation;Demonstration;Tactile cues;Verbal cues    Comprehension  Returned demonstration;Verbalized understanding      Objectives   MedbridgeAccess Code: [email or web address]  Latex free bands usedif used   Sitting posture: L trunk rotation  Standing posture: slightLlateral  shift  Pt states that pressing her back against  the chair feels better.  Gait observation: increased L lateral lean during L LE stance phase.    Worked R knee pain to help pt perform back and L knee exercises.   Therapeutic exercise  Asked pt how she does the Epley maneuver at home. Pt performing technique for R side instead of the L side. Pt performed on wrong side. Pt was instructed to perform the canalith repositioning technique for her L side. Pt verbalized understanding.   Seated L trunk side bending reproduced R medial knee pain  Seated R trunk side bend 10x5 seconds for 3 sets  No R knee pain with gait afterwards.   SLS with R UE assist, level pelvis to improve L glute med strength and decreased L lateral lean and L trunk side bend during L LE stance phase of gait.   10x3 with 5 seconds   Seated B ankle DF/PF to promote neural mobility 10x3   Pt states decrease in R medial knee symptoms.   Seated STM R medial knee x 3 minutes  reviewed today's new home exercises. Pt demonstrated and verbalized understanding. Handout provided.    Improved exercise technique, movement at target joints, use of target muscles after mod verbal, visual, tactile cues.    Response to treatment Pt tolerated session well without aggravation of symptoms.  No complain of dizziness throughout session.  Decreased R medial knee pain after session    Clinical impression Decreased R medial knee pain and tighness after manual therapy and R lumbar side bending and neural flossing to decrease tension to R L3/L4 dermatome. Pt will benefit from continued skilled physical therapy services to decrease pain, improve strength and function.        PT Short Term Goals - 10/17/Shaw 0930      PT SHORT TERM GOAL #1   Title  Patient will be independent with her HEP to help decrease back and L knee pain and improve ability to perform functional tasks.     Time  3    Period   Weeks    Status  Achieved    Target Date  10/17/Shaw        PT Long Term Goals - 03/25/19 1419      PT LONG TERM GOAL #1   Title  Patient will have a decrease in back pain to 3/10 or less at worst to promote ability to ambulate, turn in bed, perform standing tasks with less pain.     Baseline  6/10 back pain at most (09/24/2017); 7-8/10 at most for the past 7 days, duration of pain is better since starting PT (10/29/2017); 5-6/10 at most for the past 7 days (11/18/2017); 8/10 at most for the past 7 days but the duration of pain is less compared to prior to starting PT (12/12/2017).  7/10 back pain at most for the past 7 days (pt states doing her exercises helps decrease her pain; 01/02/2018); walking (3/10), standing (4/10), bed mobility (0/10) 02/05/2018; 6/10 back pain at most for the past 7 days (03/03/2018); 6.5/10 back pain at most for the past 7 days (03/27/2018); 7-8/10 at most (05/08/2018); 7/10 back pain at worst (07/22/2018);  7-8/10 at most for the past 7 days (09/22/2018); 5-7/10 at worst for the past 7 days then calms down (10/27/2018); 6/10 back pain at most for the past 7 days (when pt cooks and cleans up in kitchen; 12/17/2018); 6-7/10 back spams pain at most for the past 7 days, usually when she is doing stuff in  the kitchen (01/29/2019); 7/10 at worst for the past 7 days (03/25/2019)    Time  8    Period  Weeks    Status  On-going    Target Date  05/21/19      PT LONG TERM GOAL #2   Title  Patient will have a decrease in L knee pain to 3/10 or less at worst to promote ability to ambulate, negotiate stairs, get into and out of a car more comfortably.     Baseline  6/10 L knee pain at most for the past 3 months (09/24/2017); 7-8/10 at most for the past 7 days, duration of pain is better since starting PT (10/29/2017); 7/10 at most for the past 7 days (11/18/2017); 6/10 L knee pain at most for the past 7 days (12/12/2017); 5/10 L knee pain at most for the past 7 days (pt states doing her exercises  helps decrease pain; 01/02/2018); L knee 3/10 (02/05/2018); 4/10 at most for the past 7 days (03/03/2018); 5.5/10 L knee at most for the past 7 days (03/27/2018); 6-7/10 L knee pain at most (05/08/2018); 7/10 L knee pain at worst for the past month (07/22/2018);  6/10 L knee pain at most for the past 7 days (09/22/2018); 5-6/10 at most for the past 7 days sporadically, can get it better with exercise (10/27/2018); 6/10 L knee ache at most for the past 7 days (12/17/2018); 5-6/10 L knee pain at most for the past 7 days such as going up and down steps (01/29/2019); 5/10 at most for the past 7 days (03/25/2019)    Time  8    Period  Weeks    Status  On-going    Target Date  05/21/19      PT LONG TERM GOAL #3   Title  Patient will improve TUG time to 12 seconds or less as a demonstration of improved functional mobility and balance.     Baseline  TUG no AD: 16.05 seconds on average (09/24/2017); 14.3 seconds on average (10/29/2017); 13 seconds average (11/21/2017); 16.67 seconds average (12/12/2017);  14.17 seconds average (01/02/2018); 11.8 sec sonds (02/05/2018)    Time  6    Period  Weeks    Status  Achieved      PT LONG TERM GOAL #4   Title  Patient will improve her back FOTO score by at least 10 points as a demonstration of improved funtion.      Baseline  Back FOTO: 33 (09/24/2017); 38 (10/29/2017); 36 (11/21/2017); 40 (12/12/2017); 42 (01/02/2018); 47 (03/03/2018)    Time  6    Period  Weeks    Status  Achieved      PT LONG TERM GOAL #5   Title  Patient will improve bilateral LE strength by at least 1/2 MMT grade to promote ability to perform standing tasks.     Time  6    Period  Weeks    Status  Achieved      Additional Long Term Goals   Additional Long Term Goals  Yes      PT LONG TERM GOAL #6   Title  Patient will improve her Modified Oswestry Low Back pain disablity questionnaire by at least 10% as a demonstration of improved function.     Baseline  48% (09/24/2017); 46% (10/29/2017), (11/21/2017); 56%  (12/12/2017); 36% (01/02/2018), (03/03/2018)    Time  6    Period  Weeks    Status  Achieved      PT LONG TERM  GOAL #7   Title  Pt will report decreased difficulty stepping into and out of her bathtub as well as getting into and out of her truck to promote ability to get into and out of places.     Baseline  Pt reports difficulty stepping into and out of her bathtub as well as difficulty getting into and out of her truck (01/02/2018); Pt states the truck and bath tub and difficult and painful.  (02/05/2018); Improving per subjective reports (03/03/2018); decreased difficulty with truck tranfser after cues for technique and practice, decreasing difficulty with stepping into and out of the bath tub based on subjective reports (03/27/2018 and 04/01/2018); Decreasing difficulty stepping into a bathtub. No difficulty getting into and out of her truck (05/08/2018), (07/22/2018); Difficulty stepping into and out of the bath tub. No difficulty with truck transfers (09/22/2018); Able to get into her bath tub, difficulty getting out (10/27/18); Still has difficulty getting into and out of bathtub (02/23/2019); Better able to step into the bathtub. Still has difficulty stepping out of the bathtub due to knee flexion (03/25/2019)    Time  8    Period  Weeks    Status  Partially Met    Target Date  05/21/19      PT LONG TERM GOAL #8   Title  Patient will improve her BERG balance test score to 46/56 or more as a demonstration of decreased fall risk     Baseline  42/56 (01/02/2018); 54/56 (02/05/2018)    Time  6    Period  Weeks    Status  Achieved      PT LONG TERM GOAL  #9   TITLE  Pt will report minimal to no dizziness related to vertigo to promote balance and decrease fall risk.     Baseline  Pt reports dizziness (related to vertigo) which seem to affect her balance (01/15/2018); Patient reports "a little bit every now and then" (02/05/2018); Pt reports dizziness and decreased balance (01/29/2019); Dizziness has improved  some (02/23/2019), (03/25/2019)    Time  8    Period  Weeks    Status  On-going    Target Date  05/21/19      PT LONG TERM GOAL  #10   TITLE  Pt will be able to turn 360 degrees to the R and L at least 3 times without dizziness and no LOB to promote safety and decrease fall risk.    Baseline  360 degree turn to the R and L causes a loss of balance (12/17/2018), (01/29/2019). able to perform 3 times with each side with improved balance, minimal dizziness. (02/23/2019)    Time  8    Period  Weeks    Status  Partially Met    Target Date  05/21/19      PT LONG TERM GOAL  #11   TITLE  Patient will have a decrease in R hip and knee pain to 4/10 or less at worst to promote ability to ambulate with less discomfort.    Baseline  7/10 R hip and knee pain at worst for the past month (03/25/2019)    Time  8    Period  Weeks    Status  New    Target Date  05/21/19            Plan - 04/15/19 1320    Clinical Impression Statement  Decreased R medial knee pain and tighness after manual therapy and R lumbar side bending and neural flossing  to decrease tension to R L3/L4 dermatome. Pt will benefit from continued skilled physical therapy services to decrease pain, improve strength and function.    Personal Factors and Comorbidities  Age;Comorbidity 3+;Time since onset of injury/illness/exacerbation;Fitness;Past/Current Experience    Comorbidities  Back, knee pain, hx of dizziness, hx of back surgery, hx of C-section    Examination-Activity Limitations  Squat    Stability/Clinical Decision Making  Evolving/Moderate complexity    Rehab Potential  Fair    Clinical Impairments Affecting Rehab Potential  difficulty with carry-over of improvements in clinic, chronicity of condition, age, multiple problem areas    PT Frequency  1x / week    PT Duration  8 weeks    PT Treatment/Interventions  Therapeutic activities;Therapeutic exercise;Balance training;Neuromuscular re-education;Patient/family  education;Manual techniques;Dry needling;Aquatic Therapy;Electrical Stimulation;Iontophoresis 32m/ml Dexamethasone;Gait training;Canalith Repostioning;Vestibular    PT Next Visit Plan   hip and knee strengthening, core strengthening, patellar mobility, manual techniques, modalities PRN    Consulted and Agree with Plan of Care  Patient       Patient will benefit from skilled therapeutic intervention in order to improve the following deficits and impairments:  Pain, Improper body mechanics, Postural dysfunction, Dizziness, Decreased strength, Difficulty walking, Decreased balance  Visit Diagnosis: Chronic pain of left knee  Muscle weakness (generalized)  Radiculopathy, lumbar region  History of falling  Dizziness and giddiness  Right knee pain, unspecified chronicity  Pain in right hip     Problem List Patient Active Problem List   Diagnosis Date Noted  . Obese 06/20/2016  . Status post right knee replacement 06/19/2016  . S/P left TKA 06/18/2016  . Spinal stenosis of lumbar region Shaw/Shaw/2017    MJoneen BoersPT, DPT   04/15/2019, 6:37 PM  [facility] [address] [place] [id number]: [phone]  Fax:  [phone] Name: Destiny Shaw, Destiny Shaw

## 2019-04-22 ENCOUNTER — Ambulatory Visit: Payer: Medicare Other

## 2019-04-22 ENCOUNTER — Other Ambulatory Visit: Payer: Self-pay

## 2019-04-22 DIAGNOSIS — R42 Dizziness and giddiness: Secondary | ICD-10-CM

## 2019-04-22 DIAGNOSIS — M25562 Pain in left knee: Secondary | ICD-10-CM | POA: Diagnosis not present

## 2019-04-22 DIAGNOSIS — M25561 Pain in right knee: Secondary | ICD-10-CM

## 2019-04-22 DIAGNOSIS — M5416 Radiculopathy, lumbar region: Secondary | ICD-10-CM

## 2019-04-22 DIAGNOSIS — Z9181 History of falling: Secondary | ICD-10-CM

## 2019-04-22 DIAGNOSIS — G8929 Other chronic pain: Secondary | ICD-10-CM

## 2019-04-22 DIAGNOSIS — M25551 Pain in right hip: Secondary | ICD-10-CM

## 2019-04-22 DIAGNOSIS — M6281 Muscle weakness (generalized): Secondary | ICD-10-CM

## 2019-04-22 NOTE — Therapy (Signed)
[facility] [address] [place], [address] Phone: [phone]   Fax:  [phone]  Physical Therapy Treatment  Patient Details  Name: Destiny Shaw MRN: [id number] Date of Birth: [birth year] Referring Provider (PT): Paralee Cancel, MD   Encounter Date: 04/22/2019  PT End of Session - 04/22/19 1300    Visit Number  65    Number of Visits  102    Date for PT Re-Evaluation  05/21/19    Authorization Type  5    Authorization Time Period  of 10 progress note Medicare    PT Start Time  1300    PT Stop Time  1343    PT Time Calculation (min)  43 min    Activity Tolerance  Patient tolerated treatment well    Behavior During Therapy  [facility] for tasks assessed/performed       Past Medical History:  Diagnosis Date  . ADD (attention deficit disorder)   . Anemia   . Anginal pain ([place])    pt states has occas chest pain relates to indigestion; pt uses rest to relieve   . Anxiety   . Arthritis   . Concussion   . Depression   . Diabetes mellitus without complication ([place])   . Dizziness   . Fall   . GERD (gastroesophageal reflux disease)   . Headache   . History of urinary tract infection   . Hyperlipidemia   . Hypertension   . Hypothyroidism   . IBS (irritable bowel syndrome)   . Imbalance   . Numbness    right leg   . Numbness in both hands    comes and goes   . Pneumonia    last episode approx 1 year ago   . Sleep apnea   . Wears glasses     Past Surgical History:  Procedure Laterality Date  . BREAST CYST ASPIRATION Left 1980's   neg  . BREAST CYST EXCISION Right 1980's   neg  . BREAST LUMPECTOMY Right   . BREAST SURGERY    . CARPAL TUNNEL RELEASE    . CESAREAN SECTION     times 2  . COLONOSCOPY WITH PROPOFOL N/A 05/11/2015   Procedure: COLONOSCOPY WITH PROPOFOL;  Surgeon: Manya Silvas, MD;  Location: [facility] ENDOSCOPY;  Service: Endoscopy;  Laterality: N/A;  . DE QUERVAIN'S RELEASE Right   .  ESOPHAGOGASTRODUODENOSCOPY (EGD) WITH PROPOFOL N/A 05/11/2015   Procedure: ESOPHAGOGASTRODUODENOSCOPY (EGD) WITH PROPOFOL;  Surgeon: Manya Silvas, MD;  Location: [facility] ENDOSCOPY;  Service: Endoscopy;  Laterality: N/A;  . EYE SURGERY     laser surgery bilat   . HERNIA REPAIR    . KNEE ARTHROSCOPY    . LUMBAR LAMINECTOMY/DECOMPRESSION MICRODISCECTOMY Bilateral 09/14/2015   Procedure: MICRO LUMBAR BILATERAL DECOMPRESSION L4 - L5;  Surgeon: Susa Day, MD;  Location: [facility] ORS;  Service: Orthopedics;  Laterality: Bilateral;  . REDUCTION MAMMAPLASTY Bilateral 1980  . RHINOPLASTY    . TONSILLECTOMY    . TOTAL KNEE ARTHROPLASTY Left 06/18/2016   Procedure: LEFT TOTAL KNEE ARTHROPLASTY;  Surgeon: Paralee Cancel, MD;  Location: [facility] ORS;  Service: Orthopedics;  Laterality: Left;  Adductor Block  . TUBAL LIGATION    . UVULOPALATOPHARYNGOPLASTY      There were no vitals filed for this visit.  Subjective Assessment - 04/22/19 1302    Subjective  R hip, thigh and knee have been bothering her especially when she walks 6.5/10. L knee seems fine, not bothering  her today. R low back hurts especially when walking 5.5/10.    Pertinent History  Low back pain. Pt states having back surgery about 2-3 years ago. After she had her L TKA, her back started aggravating her due to her walking. Pt also fell about 4 weeks ago onto her L knee. Dr. Alvan Dame checked out her L knee. L knee was fine but has some inflammation.  Pt tries to keep it iced. Pt still recovering for her L TKA but the fall set her back.   Her L knee surgery was last year.  L knee still bothers her a lot.  Had surgery for her back before her L knee surgery. Was doing well until her L knee surgery. The fall made it worse. Feels debilitating. Also has a hard time getting into and out of the car mainly due to her L knee.   Pt fell due to her L knee bucking on her. Pt was trying to pick something up from the side of the couch.  Also has a hard time getting up from  the floor.  Pt states having bladder accidents since after her fall (pt was recommended to tell her MD about it).  Bowel problems since taking medications (was constapated, but after taking medications, pt had diarrhea which was difficult to control. Getting out of the metformin fixed the bowel issue).  Pt states tingling and numbness L lateral LE along the L5 dermatome.  Denies saddle anesthesia.  Pt states that her back problem is mainly on her L side.  Pt landed on her L knee when she fell.  No other falls within the last 6 months. Pt also states being very dizzy since her L knee surgery.  Had PT treatement for her dizziness before which did not help.  The room feels like it is spinning when she gets dizzy.  I feel like it is getting better then she does something.  5/10 low back soreness currently (7/10 at most for the past 7 days after negotiating stairs, 5/10 without doing stairs). 4/10 L knee pain currenglty with gait 5/10 L knee pain at most for the past 7 days.    Patient Stated Goals  Be better able to get into and out of her vehicle (midsized truck), into and out of chairs, be better able to roll in her bed with less L knee pain. Be able to get down on the floor and get back up.     Currently in Pain?  Yes    Pain Score  7    6.5/10   Pain Onset  More than a month ago                               PT Education - 04/22/19 1321    Education Details  ther-ex    Person(s) Educated  Patient    Methods  Explanation;Demonstration;Tactile cues;Verbal cues    Comprehension  Returned demonstration;Verbalized understanding         Objectives   MedbridgeAccess Code: [email or web address]  Latex free bands usedif used   Sitting posture: L trunk rotation  Standing posture: slightLlateral shift  Pt states that pressing her back against the chair feels better.  Gait observation: increased L lateral lean during L LE stance phase.    Worked R knee pain  to help pt perform back and L knee exercises.   Therapeutic exercise  physioball rolls flexion   To the  R 10x10 seconds to the R to decrease disc related discomfort while decreasing stenosis related discomfort.   Decreased discomfort  SLS with R UE assist, level pelvis to improve L glute med strength and decrease L lateral lean and L trunk side bend during L LE stance phase of gait.              10x3 with 5 seconds    Seated R trunk side bend 10x5 seconds for 3 sets  Decreased R sided pain             Supine hip at 90/90  R hip IR 10x3 with PT to improve mobility   Seated L knee flexion PROM 10x3  Then AAROM 10x3 with 5 second holds to improve ROM and ability to get into and out of bathtub    Improved exercise technique, movement at target joints, use of target muscles after mod verbal, visual, tactile cues.    Response to treatment Pt tolerated session well without aggravation of symptoms. Decreased R medial knee pain after session    Clinical impression Pt demonstrates signs and symptoms of disc related pathology with R LE and low back pain presentation today. Decreased pain with R side bending movements. Continued working on L knee flexion ROM to promote ability to get into and out of her bath tub.  Pt will benefit from continued skilled physical therapy to decrease pain, improve strength and function.      PT Short Term Goals - 12/12/17 0930      PT SHORT TERM GOAL #1   Title  Patient will be independent with her HEP to help decrease back and L knee pain and improve ability to perform functional tasks.     Time  3    Period  Weeks    Status  Achieved    Target Date  12/12/17        PT Long Term Goals - 03/25/19 1419      PT LONG TERM GOAL #1   Title  Patient will have a decrease in back pain to 3/10 or less at worst to promote ability to ambulate, turn in bed, perform standing tasks with less pain.     Baseline  6/10 back pain at most  (09/24/2017); 7-8/10 at most for the past 7 days, duration of pain is better since starting PT (10/29/2017); 5-6/10 at most for the past 7 days (11/18/2017); 8/10 at most for the past 7 days but the duration of pain is less compared to prior to starting PT (12/12/2017).  7/10 back pain at most for the past 7 days (pt states doing her exercises helps decrease her pain; 01/02/2018); walking (3/10), standing (4/10), bed mobility (0/10) 02/05/2018; 6/10 back pain at most for the past 7 days (03/03/2018); 6.5/10 back pain at most for the past 7 days (03/27/2018); 7-8/10 at most (05/08/2018); 7/10 back pain at worst (07/22/2018);  7-8/10 at most for the past 7 days (09/22/2018); 5-7/10 at worst for the past 7 days then calms down (10/27/2018); 6/10 back pain at most for the past 7 days (when pt cooks and cleans up in kitchen; 12/17/2018); 6-7/10 back spams pain at most for the past 7 days, usually when she is doing stuff in the kitchen (01/29/2019); 7/10 at worst for the past 7 days (03/25/2019)    Time  8    Period  Weeks    Status  On-going    Target Date  05/21/19      PT  LONG TERM GOAL #2   Title  Patient will have a decrease in L knee pain to 3/10 or less at worst to promote ability to ambulate, negotiate stairs, get into and out of a car more comfortably.     Baseline  6/10 L knee pain at most for the past 3 months (09/24/2017); 7-8/10 at most for the past 7 days, duration of pain is better since starting PT (10/29/2017); 7/10 at most for the past 7 days (11/18/2017); 6/10 L knee pain at most for the past 7 days (12/12/2017); 5/10 L knee pain at most for the past 7 days (pt states doing her exercises helps decrease pain; 01/02/2018); L knee 3/10 (02/05/2018); 4/10 at most for the past 7 days (03/03/2018); 5.5/10 L knee at most for the past 7 days (03/27/2018); 6-7/10 L knee pain at most (05/08/2018); 7/10 L knee pain at worst for the past month (07/22/2018);  6/10 L knee pain at most for the past 7 days (09/22/2018); 5-6/10 at most  for the past 7 days sporadically, can get it better with exercise (10/27/2018); 6/10 L knee ache at most for the past 7 days (12/17/2018); 5-6/10 L knee pain at most for the past 7 days such as going up and down steps (01/29/2019); 5/10 at most for the past 7 days (03/25/2019)    Time  8    Period  Weeks    Status  On-going    Target Date  05/21/19      PT LONG TERM GOAL #3   Title  Patient will improve TUG time to 12 seconds or less as a demonstration of improved functional mobility and balance.     Baseline  TUG no AD: 16.05 seconds on average (09/24/2017); 14.3 seconds on average (10/29/2017); 13 seconds average (11/21/2017); 16.67 seconds average (12/12/2017);  14.17 seconds average (01/02/2018); 11.8 sec sonds (02/05/2018)    Time  6    Period  Weeks    Status  Achieved      PT LONG TERM GOAL #4   Title  Patient will improve her back FOTO score by at least 10 points as a demonstration of improved funtion.      Baseline  Back FOTO: 33 (09/24/2017); 38 (10/29/2017); 36 (11/21/2017); 40 (12/12/2017); 42 (01/02/2018); 47 (03/03/2018)    Time  6    Period  Weeks    Status  Achieved      PT LONG TERM GOAL #5   Title  Patient will improve bilateral LE strength by at least 1/2 MMT grade to promote ability to perform standing tasks.     Time  6    Period  Weeks    Status  Achieved      Additional Long Term Goals   Additional Long Term Goals  Yes      PT LONG TERM GOAL #6   Title  Patient will improve her Modified Oswestry Low Back pain disablity questionnaire by at least 10% as a demonstration of improved function.     Baseline  48% (09/24/2017); 46% (10/29/2017), (11/21/2017); 56% (12/12/2017); 36% (01/02/2018), (03/03/2018)    Time  6    Period  Weeks    Status  Achieved      PT LONG TERM GOAL #7   Title  Pt will report decreased difficulty stepping into and out of her bathtub as well as getting into and out of her truck to promote ability to get into and out of places.     Baseline  Pt reports  difficulty stepping into and out of her bathtub as well as difficulty getting into and out of her truck (01/02/2018); Pt states the truck and bath tub and difficult and painful.  (02/05/2018); Improving per subjective reports (03/03/2018); decreased difficulty with truck tranfser after cues for technique and practice, decreasing difficulty with stepping into and out of the bath tub based on subjective reports (03/27/2018 and 04/01/2018); Decreasing difficulty stepping into a bathtub. No difficulty getting into and out of her truck (05/08/2018), (07/22/2018); Difficulty stepping into and out of the bath tub. No difficulty with truck transfers (09/22/2018); Able to get into her bath tub, difficulty getting out (10/27/18); Still has difficulty getting into and out of bathtub (02/23/2019); Better able to step into the bathtub. Still has difficulty stepping out of the bathtub due to knee flexion (03/25/2019)    Time  8    Period  Weeks    Status  Partially Met    Target Date  05/21/19      PT LONG TERM GOAL #8   Title  Patient will improve her BERG balance test score to 46/56 or more as a demonstration of decreased fall risk     Baseline  42/56 (01/02/2018); 54/56 (02/05/2018)    Time  6    Period  Weeks    Status  Achieved      PT LONG TERM GOAL  #9   TITLE  Pt will report minimal to no dizziness related to vertigo to promote balance and decrease fall risk.     Baseline  Pt reports dizziness (related to vertigo) which seem to affect her balance (01/15/2018); Patient reports "a little bit every now and then" (02/05/2018); Pt reports dizziness and decreased balance (01/29/2019); Dizziness has improved some (02/23/2019), (03/25/2019)    Time  8    Period  Weeks    Status  On-going    Target Date  05/21/19      PT LONG TERM GOAL  #10   TITLE  Pt will be able to turn 360 degrees to the R and L at least 3 times without dizziness and no LOB to promote safety and decrease fall risk.    Baseline  360 degree turn to the  R and L causes a loss of balance (12/17/2018), (01/29/2019). able to perform 3 times with each side with improved balance, minimal dizziness. (02/23/2019)    Time  8    Period  Weeks    Status  Partially Met    Target Date  05/21/19      PT LONG TERM GOAL  #11   TITLE  Patient will have a decrease in R hip and knee pain to 4/10 or less at worst to promote ability to ambulate with less discomfort.    Baseline  7/10 R hip and knee pain at worst for the past month (03/25/2019)    Time  8    Period  Weeks    Status  New    Target Date  05/21/19            Plan - 04/22/19 1259    Clinical Impression Statement  Pt demonstrates signs and symptoms of disc related pathology with R LE and low back pain presentation today. Decreased pain with R side bending movements. Continued working on L knee flexion ROM to promote ability to get into and out of her bath tub.  Pt will benefit from continued skilled physical therapy to decrease pain, improve strength and function.  Personal Factors and Comorbidities  Age;Comorbidity 3+;Time since onset of injury/illness/exacerbation;Fitness;Past/Current Experience    Comorbidities  Back, knee pain, hx of dizziness, hx of back surgery, hx of C-section    Examination-Activity Limitations  Squat    Stability/Clinical Decision Making  Evolving/Moderate complexity    Rehab Potential  Fair    Clinical Impairments Affecting Rehab Potential  difficulty with carry-over of improvements in clinic, chronicity of condition, age, multiple problem areas    PT Frequency  1x / week    PT Duration  8 weeks    PT Treatment/Interventions  Therapeutic activities;Therapeutic exercise;Balance training;Neuromuscular re-education;Patient/family education;Manual techniques;Dry needling;Aquatic Therapy;Electrical Stimulation;Iontophoresis 50m/ml Dexamethasone;Gait training;Canalith Repostioning;Vestibular    PT Next Visit Plan   hip and knee strengthening, core strengthening, patellar  mobility, manual techniques, modalities PRN    Consulted and Agree with Plan of Care  Patient       Patient will benefit from skilled therapeutic intervention in order to improve the following deficits and impairments:  Pain, Improper body mechanics, Postural dysfunction, Dizziness, Decreased strength, Difficulty walking, Decreased balance  Visit Diagnosis: Chronic pain of left knee  Muscle weakness (generalized)  Radiculopathy, lumbar region  History of falling  Dizziness and giddiness  Right knee pain, unspecified chronicity  Pain in right hip     Problem List Patient Active Problem List   Diagnosis Date Noted  . Obese 06/20/2016  . Status post right knee replacement 06/19/2016  . S/P left TKA 06/18/2016  . Spinal stenosis of lumbar region 09/14/2015    MJoneen BoersPT, DPT   04/22/2019, 5:14 PM  [facility] [address] [place] [address]Phone: [phone]  Fax:  [phone] Name: RHARUKA KOWALESKIMRN: [id number] of Birth: 411/22/1952

## 2019-04-24 ENCOUNTER — Ambulatory Visit: Payer: Medicare Other | Attending: Internal Medicine

## 2019-04-24 DIAGNOSIS — Z23 Encounter for immunization: Secondary | ICD-10-CM

## 2019-04-24 NOTE — Progress Notes (Signed)
   Covid-19 Vaccination Clinic  Name:  Destiny Shaw    MRN: [id number] DOB: [birth year]  04/24/2019  Destiny Shaw was observed post Covid-19 immunization for 15 minutes without incidence. She was provided with Vaccine Information Sheet and instruction to access the V-Safe system.   Destiny Shaw was instructed to call 911 with any severe reactions post vaccine: Marland Kitchen Difficulty breathing  . Swelling of your face and throat  . A fast heartbeat  . A bad rash all over your body  . Dizziness and weakness    Immunizations Administered    Name Date Dose VIS Date Route   Pfizer COVID-19 Vaccine 04/24/2019 12:07 PM 0.3 mL 02/06/2019 Intramuscular   Manufacturer: [place]   Lot: [id number]   [place]: [id number]

## 2019-04-29 ENCOUNTER — Other Ambulatory Visit: Payer: Self-pay

## 2019-04-29 ENCOUNTER — Ambulatory Visit: Payer: Medicare Other | Attending: Orthopedic Surgery

## 2019-04-29 DIAGNOSIS — M25561 Pain in right knee: Secondary | ICD-10-CM

## 2019-04-29 DIAGNOSIS — M25551 Pain in right hip: Secondary | ICD-10-CM | POA: Diagnosis present

## 2019-04-29 DIAGNOSIS — Z9181 History of falling: Secondary | ICD-10-CM | POA: Diagnosis present

## 2019-04-29 DIAGNOSIS — R42 Dizziness and giddiness: Secondary | ICD-10-CM | POA: Diagnosis present

## 2019-04-29 DIAGNOSIS — M25562 Pain in left knee: Secondary | ICD-10-CM | POA: Insufficient documentation

## 2019-04-29 DIAGNOSIS — M6281 Muscle weakness (generalized): Secondary | ICD-10-CM | POA: Diagnosis present

## 2019-04-29 DIAGNOSIS — M5416 Radiculopathy, lumbar region: Secondary | ICD-10-CM | POA: Diagnosis present

## 2019-04-29 DIAGNOSIS — G8929 Other chronic pain: Secondary | ICD-10-CM | POA: Diagnosis present

## 2019-04-29 NOTE — Therapy (Signed)
[facility] [address], [place], [address] Phone: [phone]   Fax:  [phone]  Physical Therapy Treatment  Patient Details  Name: Destiny Shaw MRN: [id number] Date of Birth: 27-Feb-1950 Referring Provider (PT): Paralee Cancel, MD   Encounter Date: 04/29/2019  PT End of Session - 04/29/19 1350    Visit Number  47    Number of Visits  102    Date for PT Re-Evaluation  05/21/19    Authorization Type  6    Authorization Time Period  of 10 progress note Medicare    PT Start Time  1350    PT Stop Time  1430    PT Time Calculation (min)  40 min    Activity Tolerance  Patient tolerated treatment well    Behavior During Therapy  [facility] for tasks assessed/performed       Past Medical History:  Diagnosis Date  . ADD (attention deficit disorder)   . Anemia   . Anginal pain ([place])    pt states has occas chest pain relates to indigestion; pt uses rest to relieve   . Anxiety   . Arthritis   . Concussion   . Depression   . Diabetes mellitus without complication ([place])   . Dizziness   . Fall   . GERD (gastroesophageal reflux disease)   . Headache   . History of urinary tract infection   . Hyperlipidemia   . Hypertension   . Hypothyroidism   . IBS (irritable bowel syndrome)   . Imbalance   . Numbness    right leg   . Numbness in both hands    comes and goes   . Pneumonia    last episode approx 1 year ago   . Sleep apnea   . Wears glasses     Past Surgical History:  Procedure Laterality Date  . BREAST CYST ASPIRATION Left 1980's   neg  . BREAST CYST EXCISION Right 1980's   neg  . BREAST LUMPECTOMY Right   . BREAST SURGERY    . CARPAL TUNNEL RELEASE    . CESAREAN SECTION     times 2  . COLONOSCOPY WITH PROPOFOL N/A 05/11/2015   Procedure: COLONOSCOPY WITH PROPOFOL;  Surgeon: Manya Silvas, MD;  Location: [facility] ENDOSCOPY;  Service: Endoscopy;  Laterality: N/A;  . DE QUERVAIN'S RELEASE Right   .  ESOPHAGOGASTRODUODENOSCOPY (EGD) WITH PROPOFOL N/A 05/11/2015   Procedure: ESOPHAGOGASTRODUODENOSCOPY (EGD) WITH PROPOFOL;  Surgeon: Manya Silvas, MD;  Location: [facility] ENDOSCOPY;  Service: Endoscopy;  Laterality: N/A;  . EYE SURGERY     laser surgery bilat   . HERNIA REPAIR    . KNEE ARTHROSCOPY    . LUMBAR LAMINECTOMY/DECOMPRESSION MICRODISCECTOMY Bilateral 09/14/2015   Procedure: MICRO LUMBAR BILATERAL DECOMPRESSION L4 - L5;  Surgeon: Susa Day, MD;  Location: [facility] ORS;  Service: Orthopedics;  Laterality: Bilateral;  . REDUCTION MAMMAPLASTY Bilateral 1980  . RHINOPLASTY    . TONSILLECTOMY    . TOTAL KNEE ARTHROPLASTY Left 06/18/2016   Procedure: LEFT TOTAL KNEE ARTHROPLASTY;  Surgeon: Paralee Cancel, MD;  Location: [facility] ORS;  Service: Orthopedics;  Laterality: Left;  Adductor Block  . TUBAL LIGATION    . UVULOPALATOPHARYNGOPLASTY      There were no vitals filed for this visit.  Subjective Assessment - 04/29/19 1352    Subjective  R side has been very painful. Has been doing both R and L sides for side bending HEP. Low back  has been hurting a lot. Seated L piriformis stretch. R low back, L thigh, and knee is bothering her currently. Has had L knee soreness since a couple of days ago.    Pertinent History  Low back pain. Pt states having back surgery about 2-3 years ago. After she had her L TKA, her back started aggravating her due to her walking. Pt also fell about 4 weeks ago onto her L knee. Dr. Alvan Dame checked out her L knee. L knee was fine but has some inflammation.  Pt tries to keep it iced. Pt still recovering for her L TKA but the fall set her back.   Her L knee surgery was last year.  L knee still bothers her a lot.  Had surgery for her back before her L knee surgery. Was doing well until her L knee surgery. The fall made it worse. Feels debilitating. Also has a hard time getting into and out of the car mainly due to her L knee.   Pt fell due to her L knee bucking on her. Pt was trying to  pick something up from the side of the couch.  Also has a hard time getting up from the floor.  Pt states having bladder accidents since after her fall (pt was recommended to tell her MD about it).  Bowel problems since taking medications (was constapated, but after taking medications, pt had diarrhea which was difficult to control. Getting out of the metformin fixed the bowel issue).  Pt states tingling and numbness L lateral LE along the L5 dermatome.  Denies saddle anesthesia.  Pt states that her back problem is mainly on her L side.  Pt landed on her L knee when she fell.  No other falls within the last 6 months. Pt also states being very dizzy since her L knee surgery.  Had PT treatement for her dizziness before which did not help.  The room feels like it is spinning when she gets dizzy.  I feel like it is getting better then she does something.  5/10 low back soreness currently (7/10 at most for the past 7 days after negotiating stairs, 5/10 without doing stairs). 4/10 L knee pain currenglty with gait 5/10 L knee pain at most for the past 7 days.    Patient Stated Goals  Be better able to get into and out of her vehicle (midsized truck), into and out of chairs, be better able to roll in her bed with less L knee pain. Be able to get down on the floor and get back up.     Currently in Pain?  Yes    Pain Score  8    7.5/10 R hip, thigh, and knee pain when walking   Pain Onset  More than a month ago                               PT Education - 04/29/19 1442    Education Details  ther-ex    Person(s) Educated  Patient    Methods  Explanation;Demonstration;Tactile cues;Verbal cues    Comprehension  Returned demonstration;Verbalized understanding      Objectives   MedbridgeAccess Code: [email or web address]  Latex free bands usedif used   Sitting posture: L trunk rotation  Standing posture: slightLlateral shift  Pt states that pressing her back against the chair  feels better.  Gait observation: increased L lateral lean during L LE stance phase.  Worked R knee pain to help pt perform back and L knee exercises.   Therapeutic exercise  Pt was recommended to perform side bend to R side only at home.   Seated R trunk side bend 10x10 seconds for 2 sets             Decreased R sided LE pain   physioball rolls flexion              To the R 5x20 seconds to the R to decrease disc related discomfort while decreasing stenosis related discomfort.           Seated hip adduction ball and glute max squeeze on slightly elevated low mat table 10x5 seconds for 2 sets  Supine R hip IR stretch with PT secondary to decreased IR ROM compared to L 1x  Supine R hip IR PROM with PT at 90/90 position 10x3  Seated crossing R LE over L 4x  Then vice versa 5x  Improved level of comfort with motion with increased repetition to decrease stiffness.      Improved exercise technique, movement at target joints, use of target muscles after mod verbal, visual, tactile cues.    Response to treatment Pt tolerated session well without aggravation of symptoms. Decreased Rmedialknee pain after session   Clinical impression Decreased R hip and low back pain with treatment to promote R side bending with lumbar flexion. Improved R hip IR ROM with stretch. Improved ability to cross legs simulating donning and doffing her pants with increased repetition suggesting hip stiffness bilaterally. Pt tolerated session well without aggravation of symptoms. No complain of vertigo symptoms throughout session. Pt will benefit from continued skilled physical therapy services to decrease pain, improve strength and function.      PT Short Term Goals - 12/12/17 0930      PT SHORT TERM GOAL #1   Title  Patient will be independent with her HEP to help decrease back and L knee pain and improve ability to perform functional tasks.     Time  3    Period   Weeks    Status  Achieved    Target Date  12/12/17        PT Long Term Goals - 03/25/19 1419      PT LONG TERM GOAL #1   Title  Patient will have a decrease in back pain to 3/10 or less at worst to promote ability to ambulate, turn in bed, perform standing tasks with less pain.     Baseline  6/10 back pain at most (09/24/2017); 7-8/10 at most for the past 7 days, duration of pain is better since starting PT (10/29/2017); 5-6/10 at most for the past 7 days (11/18/2017); 8/10 at most for the past 7 days but the duration of pain is less compared to prior to starting PT (12/12/2017).  7/10 back pain at most for the past 7 days (pt states doing her exercises helps decrease her pain; 01/02/2018); walking (3/10), standing (4/10), bed mobility (0/10) 02/05/2018; 6/10 back pain at most for the past 7 days (03/03/2018); 6.5/10 back pain at most for the past 7 days (03/27/2018); 7-8/10 at most (05/08/2018); 7/10 back pain at worst (07/22/2018);  7-8/10 at most for the past 7 days (09/22/2018); 5-7/10 at worst for the past 7 days then calms down (10/27/2018); 6/10 back pain at most for the past 7 days (when pt cooks and cleans up in kitchen; 12/17/2018); 6-7/10 back spams pain at most for the past  7 days, usually when she is doing stuff in the kitchen (01/29/2019); 7/10 at worst for the past 7 days (03/25/2019)    Time  8    Period  Weeks    Status  On-going    Target Date  05/21/19      PT LONG TERM GOAL #2   Title  Patient will have a decrease in L knee pain to 3/10 or less at worst to promote ability to ambulate, negotiate stairs, get into and out of a car more comfortably.     Baseline  6/10 L knee pain at most for the past 3 months (09/24/2017); 7-8/10 at most for the past 7 days, duration of pain is better since starting PT (10/29/2017); 7/10 at most for the past 7 days (11/18/2017); 6/10 L knee pain at most for the past 7 days (12/12/2017); 5/10 L knee pain at most for the past 7 days (pt states doing her exercises  helps decrease pain; 01/02/2018); L knee 3/10 (02/05/2018); 4/10 at most for the past 7 days (03/03/2018); 5.5/10 L knee at most for the past 7 days (03/27/2018); 6-7/10 L knee pain at most (05/08/2018); 7/10 L knee pain at worst for the past month (07/22/2018);  6/10 L knee pain at most for the past 7 days (09/22/2018); 5-6/10 at most for the past 7 days sporadically, can get it better with exercise (10/27/2018); 6/10 L knee ache at most for the past 7 days (12/17/2018); 5-6/10 L knee pain at most for the past 7 days such as going up and down steps (01/29/2019); 5/10 at most for the past 7 days (03/25/2019)    Time  8    Period  Weeks    Status  On-going    Target Date  05/21/19      PT LONG TERM GOAL #3   Title  Patient will improve TUG time to 12 seconds or less as a demonstration of improved functional mobility and balance.     Baseline  TUG no AD: 16.05 seconds on average (09/24/2017); 14.3 seconds on average (10/29/2017); 13 seconds average (11/21/2017); 16.67 seconds average (12/12/2017);  14.17 seconds average (01/02/2018); 11.8 sec sonds (02/05/2018)    Time  6    Period  Weeks    Status  Achieved      PT LONG TERM GOAL #4   Title  Patient will improve her back FOTO score by at least 10 points as a demonstration of improved funtion.      Baseline  Back FOTO: 33 (09/24/2017); 38 (10/29/2017); 36 (11/21/2017); 40 (12/12/2017); 42 (01/02/2018); 47 (03/03/2018)    Time  6    Period  Weeks    Status  Achieved      PT LONG TERM GOAL #5   Title  Patient will improve bilateral LE strength by at least 1/2 MMT grade to promote ability to perform standing tasks.     Time  6    Period  Weeks    Status  Achieved      Additional Long Term Goals   Additional Long Term Goals  Yes      PT LONG TERM GOAL #6   Title  Patient will improve her Modified Oswestry Low Back pain disablity questionnaire by at least 10% as a demonstration of improved function.     Baseline  48% (09/24/2017); 46% (10/29/2017), (11/21/2017); 56%  (12/12/2017); 36% (01/02/2018), (03/03/2018)    Time  6    Period  Weeks    Status  Achieved      PT LONG TERM GOAL #7   Title  Pt will report decreased difficulty stepping into and out of her bathtub as well as getting into and out of her truck to promote ability to get into and out of places.     Baseline  Pt reports difficulty stepping into and out of her bathtub as well as difficulty getting into and out of her truck (01/02/2018); Pt states the truck and bath tub and difficult and painful.  (02/05/2018); Improving per subjective reports (03/03/2018); decreased difficulty with truck tranfser after cues for technique and practice, decreasing difficulty with stepping into and out of the bath tub based on subjective reports (03/27/2018 and 04/01/2018); Decreasing difficulty stepping into a bathtub. No difficulty getting into and out of her truck (05/08/2018), (07/22/2018); Difficulty stepping into and out of the bath tub. No difficulty with truck transfers (09/22/2018); Able to get into her bath tub, difficulty getting out (10/27/18); Still has difficulty getting into and out of bathtub (02/23/2019); Better able to step into the bathtub. Still has difficulty stepping out of the bathtub due to knee flexion (03/25/2019)    Time  8    Period  Weeks    Status  Partially Met    Target Date  05/21/19      PT LONG TERM GOAL #8   Title  Patient will improve her BERG balance test score to 46/56 or more as a demonstration of decreased fall risk     Baseline  42/56 (01/02/2018); 54/56 (02/05/2018)    Time  6    Period  Weeks    Status  Achieved      PT LONG TERM GOAL  #9   TITLE  Pt will report minimal to no dizziness related to vertigo to promote balance and decrease fall risk.     Baseline  Pt reports dizziness (related to vertigo) which seem to affect her balance (01/15/2018); Patient reports "a little bit every now and then" (02/05/2018); Pt reports dizziness and decreased balance (01/29/2019); Dizziness has improved  some (02/23/2019), (03/25/2019)    Time  8    Period  Weeks    Status  On-going    Target Date  05/21/19      PT LONG TERM GOAL  #10   TITLE  Pt will be able to turn 360 degrees to the R and L at least 3 times without dizziness and no LOB to promote safety and decrease fall risk.    Baseline  360 degree turn to the R and L causes a loss of balance (12/17/2018), (01/29/2019). able to perform 3 times with each side with improved balance, minimal dizziness. (02/23/2019)    Time  8    Period  Weeks    Status  Partially Met    Target Date  05/21/19      PT LONG TERM GOAL  #11   TITLE  Patient will have a decrease in R hip and knee pain to 4/10 or less at worst to promote ability to ambulate with less discomfort.    Baseline  7/10 R hip and knee pain at worst for the past month (03/25/2019)    Time  8    Period  Weeks    Status  New    Target Date  05/21/19            Plan - 04/29/19 1438    Clinical Impression Statement  Decreased R hip and low back pain with treatment  to promote R side bending with lumbar flexion. Improved R hip IR ROM with stretch. Improved ability to cross legs simulating donning and doffing her pants with increased repetition suggesting hip stiffness bilaterally. Pt tolerated session well without aggravation of symptoms. No complain of vertigo symptoms throughout session. Pt will benefit from continued skilled physical therapy services to decrease pain, improve strength and function.    Personal Factors and Comorbidities  Age;Comorbidity 3+;Time since onset of injury/illness/exacerbation;Fitness;Past/Current Experience    Comorbidities  Back, knee pain, hx of dizziness, hx of back surgery, hx of C-section    Examination-Activity Limitations  Squat    Stability/Clinical Decision Making  Evolving/Moderate complexity    Rehab Potential  Fair    Clinical Impairments Affecting Rehab Potential  difficulty with carry-over of improvements in clinic, chronicity of condition,  age, multiple problem areas    PT Frequency  1x / week    PT Duration  8 weeks    PT Treatment/Interventions  Therapeutic activities;Therapeutic exercise;Balance training;Neuromuscular re-education;Patient/family education;Manual techniques;Dry needling;Aquatic Therapy;Electrical Stimulation;Iontophoresis 10m/ml Dexamethasone;Gait training;Canalith Repostioning;Vestibular    PT Next Visit Plan   hip and knee strengthening, core strengthening, patellar mobility, manual techniques, modalities PRN    Consulted and Agree with Plan of Care  Patient       Patient will benefit from skilled therapeutic intervention in order to improve the following deficits and impairments:  Pain, Improper body mechanics, Postural dysfunction, Dizziness, Decreased strength, Difficulty walking, Decreased balance  Visit Diagnosis: Muscle weakness (generalized)  Radiculopathy, lumbar region  Pain in right hip  Right knee pain, unspecified chronicity     Problem List Patient Active Problem List   Diagnosis Date Noted  . Obese 06/20/2016  . Status post right knee replacement 06/19/2016  . S/P left TKA 06/18/2016  . Spinal stenosis of lumbar region 09/14/2015    MJoneen BoersPT, DPT   04/29/2019, 2:43 PM  [facility] [address] [place] [address]Phone: [phone]  Fax:  [phone] Name: Destiny SAYLERMRN: [id number] of Birth: [birth year]

## 2019-05-06 ENCOUNTER — Ambulatory Visit: Payer: Medicare Other

## 2019-05-06 ENCOUNTER — Telehealth: Payer: Self-pay

## 2019-05-06 NOTE — Telephone Encounter (Signed)
No show. Called patient who said that she did not realize that she had an appointment today. Might not have copied it to her calendar. Pt was provided with the rest of the schedule including the COVID vaccine second dose date and time in EPIC which pt verbalized understanding. Pt verbalized she can make it not her next appointment.

## 2019-05-13 ENCOUNTER — Other Ambulatory Visit: Payer: Self-pay

## 2019-05-13 ENCOUNTER — Ambulatory Visit: Payer: Medicare Other

## 2019-05-13 DIAGNOSIS — M6281 Muscle weakness (generalized): Secondary | ICD-10-CM | POA: Diagnosis not present

## 2019-05-13 DIAGNOSIS — R42 Dizziness and giddiness: Secondary | ICD-10-CM

## 2019-05-13 DIAGNOSIS — Z9181 History of falling: Secondary | ICD-10-CM

## 2019-05-13 NOTE — Therapy (Signed)
[facility] [address] [place], [address] Phone: [phone]   Fax:  [phone]  Physical Therapy Treatment  Patient Details  Name: Destiny Shaw MRN: [id number] Date of Birth: 1950/11/17 Referring Provider (PT): Paralee Cancel, MD   Encounter Date: 05/13/2019  PT End of Session - 05/13/19 1355    Visit Number  53    Number of Visits  102    Date for PT Re-Evaluation  05/21/19    Authorization Type  7    Authorization Time Period  of 10 progress note Medicare    PT Start Time  1357   pt arrived late   PT Stop Time  1444    PT Time Calculation (min)  47 min    Activity Tolerance  Patient tolerated treatment well    Behavior During Therapy  [facility] for tasks assessed/performed       Past Medical History:  Diagnosis Date  . ADD (attention deficit disorder)   . Anemia   . Anginal pain ([place])    pt states has occas chest pain relates to indigestion; pt uses rest to relieve   . Anxiety   . Arthritis   . Concussion   . Depression   . Diabetes mellitus without complication ([place])   . Dizziness   . Fall   . GERD (gastroesophageal reflux disease)   . Headache   . History of urinary tract infection   . Hyperlipidemia   . Hypertension   . Hypothyroidism   . IBS (irritable bowel syndrome)   . Imbalance   . Numbness    right leg   . Numbness in both hands    comes and goes   . Pneumonia    last episode approx 1 year ago   . Sleep apnea   . Wears glasses     Past Surgical History:  Procedure Laterality Date  . BREAST CYST ASPIRATION Left 1980's   neg  . BREAST CYST EXCISION Right 1980's   neg  . BREAST LUMPECTOMY Right   . BREAST SURGERY    . CARPAL TUNNEL RELEASE    . CESAREAN SECTION     times 2  . COLONOSCOPY WITH PROPOFOL N/A 05/11/2015   Procedure: COLONOSCOPY WITH PROPOFOL;  Surgeon: Manya Silvas, MD;  Location: [facility] ENDOSCOPY;  Service: Endoscopy;  Laterality: N/A;  . DE QUERVAIN'S  RELEASE Right   . ESOPHAGOGASTRODUODENOSCOPY (EGD) WITH PROPOFOL N/A 05/11/2015   Procedure: ESOPHAGOGASTRODUODENOSCOPY (EGD) WITH PROPOFOL;  Surgeon: Manya Silvas, MD;  Location: [facility] ENDOSCOPY;  Service: Endoscopy;  Laterality: N/A;  . EYE SURGERY     laser surgery bilat   . HERNIA REPAIR    . KNEE ARTHROSCOPY    . LUMBAR LAMINECTOMY/DECOMPRESSION MICRODISCECTOMY Bilateral 09/14/2015   Procedure: MICRO LUMBAR BILATERAL DECOMPRESSION L4 - L5;  Surgeon: Susa Day, MD;  Location: [facility] ORS;  Service: Orthopedics;  Laterality: Bilateral;  . REDUCTION MAMMAPLASTY Bilateral 1980  . RHINOPLASTY    . TONSILLECTOMY    . TOTAL KNEE ARTHROPLASTY Left 06/18/2016   Procedure: LEFT TOTAL KNEE ARTHROPLASTY;  Surgeon: Paralee Cancel, MD;  Location: [facility] ORS;  Service: Orthopedics;  Laterality: Left;  Adductor Block  . TUBAL LIGATION    . UVULOPALATOPHARYNGOPLASTY      There were no vitals filed for this visit.  Subjective Assessment - 05/13/19 1358    Subjective  Feeling dizzy. Some variations in the images in the room. Did her Epley maneuver yesterday. Her  doctor pulled a cue tip in her L ear which helped a little on Monday. Still feels unsteady walking (scissor gait). Did her Epley manuaver yesterday only once at a time in the evening and before bed which helped temporarily. Also has some stress at home because she's getting some construction done.    Pertinent History  Low back pain. Pt states having back surgery about 2-3 years ago. After she had her L TKA, her back started aggravating her due to her walking. Pt also fell about 4 weeks ago onto her L knee. Dr. Alvan Dame checked out her L knee. L knee was fine but has some inflammation.  Pt tries to keep it iced. Pt still recovering for her L TKA but the fall set her back.   Her L knee surgery was last year.  L knee still bothers her a lot.  Had surgery for her back before her L knee surgery. Was doing well until her L knee surgery. The fall made it worse. Feels  debilitating. Also has a hard time getting into and out of the car mainly due to her L knee.   Pt fell due to her L knee bucking on her. Pt was trying to pick something up from the side of the couch.  Also has a hard time getting up from the floor.  Pt states having bladder accidents since after her fall (pt was recommended to tell her MD about it).  Bowel problems since taking medications (was constapated, but after taking medications, pt had diarrhea which was difficult to control. Getting out of the metformin fixed the bowel issue).  Pt states tingling and numbness L lateral LE along the L5 dermatome.  Denies saddle anesthesia.  Pt states that her back problem is mainly on her L side.  Pt landed on her L knee when she fell.  No other falls within the last 6 months. Pt also states being very dizzy since her L knee surgery.  Had PT treatement for her dizziness before which did not help.  The room feels like it is spinning when she gets dizzy.  I feel like it is getting better then she does something.  5/10 low back soreness currently (7/10 at most for the past 7 days after negotiating stairs, 5/10 without doing stairs). 4/10 L knee pain currenglty with gait 5/10 L knee pain at most for the past 7 days.    Patient Stated Goals  Be better able to get into and out of her vehicle (midsized truck), into and out of chairs, be better able to roll in her bed with less L knee pain. Be able to get down on the floor and get back up.     Currently in Pain?  Other (Comment)   no complain of pain. but dizziness.   Pain Onset  More than a month ago                               PT Education - 05/13/19 1429    Education Details  ther-ex    Person(s) Educated  Patient    Methods  Explanation;Demonstration;Tactile cues;Verbal cues    Comprehension  Returned demonstration;Verbalized understanding      Objectives   MedbridgeAccess Code: [email or web address]  Latex free bands usedif  used   Sitting posture: L trunk rotation  Standing posture: slightLlateral shift  Pt states that pressing her back against the chair feels better.  Gait observation:  increased L lateral lean during L LE stance phase.    Canalith Repositioning    Therapeutic exercise  Dix hallpike  (+) L but symptoms did not decrease with static hold (for Epley maneuver)  Pt however states that if she relaxes her neck, symptoms gets better  No symptoms when returning to sitting  Sit <> stand from regular chair 5x with UE to no UE assist. Improved steadiness observed after STM to neck to decrease tension.   Seated chin tucks 10x5 seconds for 3 sets  Seated B scapular retraction 10x5 seconds for 3 sets  Felt relaxation of shoulder blade muscles afterwards    physioball rolls flexion  To the R 5x20 seconds to decrease disc related discomfort while decreasing stenosis related discomfort.        Improved exercise technique, movement at target joints, use of target muscles after mod verbal, visual, tactile cues.     Manual therapy  Seated STM R cervical paraspinal and R rhomboid and upper trap muscles to decrease tension  Decreased vision symptoms, better able to turn her head reported by pt.          Response to treatment Pt tolerated session well without aggravation of symptoms.Decreased dizziness, vision symptoms and improved steadiness with walking reported by pt with treatment to decrease neck stiffness. Improved steadiness with gait with decreasing back discomfort.      Clinical impression  Pt returns to clinic today with reports of dizziness and unsteadiness which began about a week ago. ENT doctor found and removed a Q-tip in her L ear at the appointment which helped. Still has some dizziness. Performed Dix-Hall pike for L ear which reproduced symptoms but symptoms only eased off when pt reports relaxing her neck. Performed  treatment today to decrease R cervical paraspinal, rhomboid, and upper trap muscle tension which pt reports helped alleviate her vision symptoms. Improved steadiness with sit <> stand from a regular chair afterwards. Also performed seated physioball rolls to the R to help decrease low back and R LE discomfort. Decreased scissoring gait reported by pt (and observed in clinic) and more steadiness with gait observed afterwards. No reports of dizziness or vision symptoms after session. Pt will benefit from continued skilled physical therapy services to decrease pain, dizziness, fall risk, and improve balance, strength, function, and to prevent regression of her overall progress with pain, balance, and function.        PT Short Term Goals - 12/12/17 0930      PT SHORT TERM GOAL #1   Title  Patient will be independent with her HEP to help decrease back and L knee pain and improve ability to perform functional tasks.     Time  3    Period  Weeks    Status  Achieved    Target Date  12/12/17        PT Long Term Goals - 03/25/19 1419      PT LONG TERM GOAL #1   Title  Patient will have a decrease in back pain to 3/10 or less at worst to promote ability to ambulate, turn in bed, perform standing tasks with less pain.     Baseline  6/10 back pain at most (09/24/2017); 7-8/10 at most for the past 7 days, duration of pain is better since starting PT (10/29/2017); 5-6/10 at most for the past 7 days (11/18/2017); 8/10 at most for the past 7 days but the duration of pain is less compared to prior to starting PT (  12/12/2017).  7/10 back pain at most for the past 7 days (pt states doing her exercises helps decrease her pain; 01/02/2018); walking (3/10), standing (4/10), bed mobility (0/10) 02/05/2018; 6/10 back pain at most for the past 7 days (03/03/2018); 6.5/10 back pain at most for the past 7 days (03/27/2018); 7-8/10 at most (05/08/2018); 7/10 back pain at worst (07/22/2018);  7-8/10 at most for the past 7 days  (09/22/2018); 5-7/10 at worst for the past 7 days then calms down (10/27/2018); 6/10 back pain at most for the past 7 days (when pt cooks and cleans up in kitchen; 12/17/2018); 6-7/10 back spams pain at most for the past 7 days, usually when she is doing stuff in the kitchen (01/29/2019); 7/10 at worst for the past 7 days (03/25/2019)    Time  8    Period  Weeks    Status  On-going    Target Date  05/21/19      PT LONG TERM GOAL #2   Title  Patient will have a decrease in L knee pain to 3/10 or less at worst to promote ability to ambulate, negotiate stairs, get into and out of a car more comfortably.     Baseline  6/10 L knee pain at most for the past 3 months (09/24/2017); 7-8/10 at most for the past 7 days, duration of pain is better since starting PT (10/29/2017); 7/10 at most for the past 7 days (11/18/2017); 6/10 L knee pain at most for the past 7 days (12/12/2017); 5/10 L knee pain at most for the past 7 days (pt states doing her exercises helps decrease pain; 01/02/2018); L knee 3/10 (02/05/2018); 4/10 at most for the past 7 days (03/03/2018); 5.5/10 L knee at most for the past 7 days (03/27/2018); 6-7/10 L knee pain at most (05/08/2018); 7/10 L knee pain at worst for the past month (07/22/2018);  6/10 L knee pain at most for the past 7 days (09/22/2018); 5-6/10 at most for the past 7 days sporadically, can get it better with exercise (10/27/2018); 6/10 L knee ache at most for the past 7 days (12/17/2018); 5-6/10 L knee pain at most for the past 7 days such as going up and down steps (01/29/2019); 5/10 at most for the past 7 days (03/25/2019)    Time  8    Period  Weeks    Status  On-going    Target Date  05/21/19      PT LONG TERM GOAL #3   Title  Patient will improve TUG time to 12 seconds or less as a demonstration of improved functional mobility and balance.     Baseline  TUG no AD: 16.05 seconds on average (09/24/2017); 14.3 seconds on average (10/29/2017); 13 seconds average (11/21/2017); 16.67 seconds  average (12/12/2017);  14.17 seconds average (01/02/2018); 11.8 sec sonds (02/05/2018)    Time  6    Period  Weeks    Status  Achieved      PT LONG TERM GOAL #4   Title  Patient will improve her back FOTO score by at least 10 points as a demonstration of improved funtion.      Baseline  Back FOTO: 33 (09/24/2017); 38 (10/29/2017); 36 (11/21/2017); 40 (12/12/2017); 42 (01/02/2018); 47 (03/03/2018)    Time  6    Period  Weeks    Status  Achieved      PT LONG TERM GOAL #5   Title  Patient will improve bilateral LE strength by at least 1/2 MMT  grade to promote ability to perform standing tasks.     Time  6    Period  Weeks    Status  Achieved      Additional Long Term Goals   Additional Long Term Goals  Yes      PT LONG TERM GOAL #6   Title  Patient will improve her Modified Oswestry Low Back pain disablity questionnaire by at least 10% as a demonstration of improved function.     Baseline  48% (09/24/2017); 46% (10/29/2017), (11/21/2017); 56% (12/12/2017); 36% (01/02/2018), (03/03/2018)    Time  6    Period  Weeks    Status  Achieved      PT LONG TERM GOAL #7   Title  Pt will report decreased difficulty stepping into and out of her bathtub as well as getting into and out of her truck to promote ability to get into and out of places.     Baseline  Pt reports difficulty stepping into and out of her bathtub as well as difficulty getting into and out of her truck (01/02/2018); Pt states the truck and bath tub and difficult and painful.  (02/05/2018); Improving per subjective reports (03/03/2018); decreased difficulty with truck tranfser after cues for technique and practice, decreasing difficulty with stepping into and out of the bath tub based on subjective reports (03/27/2018 and 04/01/2018); Decreasing difficulty stepping into a bathtub. No difficulty getting into and out of her truck (05/08/2018), (07/22/2018); Difficulty stepping into and out of the bath tub. No difficulty with truck transfers (09/22/2018); Able  to get into her bath tub, difficulty getting out (10/27/18); Still has difficulty getting into and out of bathtub (02/23/2019); Better able to step into the bathtub. Still has difficulty stepping out of the bathtub due to knee flexion (03/25/2019)    Time  8    Period  Weeks    Status  Partially Met    Target Date  05/21/19      PT LONG TERM GOAL #8   Title  Patient will improve her BERG balance test score to 46/56 or more as a demonstration of decreased fall risk     Baseline  42/56 (01/02/2018); 54/56 (02/05/2018)    Time  6    Period  Weeks    Status  Achieved      PT LONG TERM GOAL  #9   TITLE  Pt will report minimal to no dizziness related to vertigo to promote balance and decrease fall risk.     Baseline  Pt reports dizziness (related to vertigo) which seem to affect her balance (01/15/2018); Patient reports "a little bit every now and then" (02/05/2018); Pt reports dizziness and decreased balance (01/29/2019); Dizziness has improved some (02/23/2019), (03/25/2019)    Time  8    Period  Weeks    Status  On-going    Target Date  05/21/19      PT LONG TERM GOAL  #10   TITLE  Pt will be able to turn 360 degrees to the R and L at least 3 times without dizziness and no LOB to promote safety and decrease fall risk.    Baseline  360 degree turn to the R and L causes a loss of balance (12/17/2018), (01/29/2019). able to perform 3 times with each side with improved balance, minimal dizziness. (02/23/2019)    Time  8    Period  Weeks    Status  Partially Met    Target Date  05/21/19  PT LONG TERM GOAL  #11   TITLE  Patient will have a decrease in R hip and knee pain to 4/10 or less at worst to promote ability to ambulate with less discomfort.    Baseline  7/10 R hip and knee pain at worst for the past month (03/25/2019)    Time  8    Period  Weeks    Status  New    Target Date  05/21/19            Plan - 05/13/19 1430    Clinical Impression Statement  Pt returns to clinic today  with reports of dizziness and unsteadiness which began about a week ago. ENT doctor found and removed a Q-tip in her L ear at the appointment which helped. Still has some dizziness. Performed Dix-Hall pike for L ear which reproduced symptoms but symptoms only eased off when pt reports relaxing her neck. Performed treatment today to decrease R cervical paraspinal, rhomboid, and upper trap muscle tension which pt reports helped alleviate her vision symptoms. Improved steadiness with sit <> stand from a regular chair afterwards. Also performed seated physioball rolls to the R to help decrease low back and R LE discomfort. Decreased scissoring gait reported by pt (and observed in clinic) and more steadiness with gait observed afterwards. No reports of dizziness or vision symptoms after session. Pt will benefit from continued skilled physical therapy services to decrease pain, dizziness, fall risk, and improve balance, strength, function, and to prevent regression of her overall progress with pain, balance, and function.    Personal Factors and Comorbidities  Age;Comorbidity 3+;Time since onset of injury/illness/exacerbation;Fitness;Past/Current Experience    Comorbidities  Back, knee pain, hx of dizziness, hx of back surgery, hx of C-section    Examination-Activity Limitations  Squat    Stability/Clinical Decision Making  Evolving/Moderate complexity    Rehab Potential  Fair    Clinical Impairments Affecting Rehab Potential  difficulty with carry-over of improvements in clinic, chronicity of condition, age, multiple problem areas    PT Frequency  1x / week    PT Duration  8 weeks    PT Treatment/Interventions  Therapeutic activities;Therapeutic exercise;Balance training;Neuromuscular re-education;Patient/family education;Manual techniques;Dry needling;Aquatic Therapy;Electrical Stimulation;Iontophoresis 79m/ml Dexamethasone;Gait training;Canalith Repostioning;Vestibular    PT Next Visit Plan   hip and knee  strengthening, core strengthening, patellar mobility, manual techniques, modalities PRN    Consulted and Agree with Plan of Care  Patient       Patient will benefit from skilled therapeutic intervention in order to improve the following deficits and impairments:  Pain, Improper body mechanics, Postural dysfunction, Dizziness, Decreased strength, Difficulty walking, Decreased balance  Visit Diagnosis: Muscle weakness (generalized)  History of falling  Dizziness and giddiness     Problem List Patient Active Problem List   Diagnosis Date Noted  . Obese 06/20/2016  . Status post right knee replacement 06/19/2016  . S/P left TKA 06/18/2016  . Spinal stenosis of lumbar region 09/14/2015    MJoneen BoersPT, DPT   05/13/2019, 3:00 PM  [facility] [address] [place] [address]Phone: [phone]  Fax:  [phone] Name: Destiny BADYMRN: [id number] of Birth: 405-05-1950

## 2019-05-20 ENCOUNTER — Ambulatory Visit: Payer: Medicare Other | Attending: Internal Medicine

## 2019-05-20 ENCOUNTER — Ambulatory Visit: Payer: Medicare Other

## 2019-05-20 ENCOUNTER — Other Ambulatory Visit: Payer: Self-pay

## 2019-05-20 DIAGNOSIS — R42 Dizziness and giddiness: Secondary | ICD-10-CM

## 2019-05-20 DIAGNOSIS — M25562 Pain in left knee: Secondary | ICD-10-CM

## 2019-05-20 DIAGNOSIS — Z23 Encounter for immunization: Secondary | ICD-10-CM

## 2019-05-20 DIAGNOSIS — M25561 Pain in right knee: Secondary | ICD-10-CM

## 2019-05-20 DIAGNOSIS — Z9181 History of falling: Secondary | ICD-10-CM

## 2019-05-20 DIAGNOSIS — G8929 Other chronic pain: Secondary | ICD-10-CM

## 2019-05-20 DIAGNOSIS — M25551 Pain in right hip: Secondary | ICD-10-CM

## 2019-05-20 DIAGNOSIS — M6281 Muscle weakness (generalized): Secondary | ICD-10-CM | POA: Diagnosis not present

## 2019-05-20 DIAGNOSIS — M5416 Radiculopathy, lumbar region: Secondary | ICD-10-CM

## 2019-05-20 NOTE — Progress Notes (Signed)
   Covid-19 Vaccination Clinic  Name:  Destiny Shaw    MRN: [id number] DOB: 28-Jan-1951  05/20/2019  [name] was observed post Covid-19 immunization for 15 minutes without incident. She was provided with Vaccine Information Sheet and instruction to access the V-Safe system.   [name] was instructed to call 911 with any severe reactions post vaccine: Marland Kitchen Difficulty breathing  . Swelling of face and throat  . A fast heartbeat  . A bad rash all over body  . Dizziness and weakness   Immunizations Administered    Name Date Dose VIS Date Route   Pfizer COVID-19 Vaccine 05/20/2019  4:11 PM 0.3 mL 02/06/2019 Intramuscular   Manufacturer: [place]   Lot: [id number]   [place]: [id number]

## 2019-05-20 NOTE — Therapy (Signed)
[facility] [address] [place], [address] Phone: [phone]   Fax:  [phone]  Physical Therapy Treatment  Patient Details  Name: Destiny Shaw MRN: [id number] Date of Birth: 1950/06/14 Referring Provider (PT): Paralee Cancel, MD   Encounter Date: 05/20/2019  PT End of Session - 05/20/19 1304    Visit Number  60    Number of Visits  102    Date for PT Re-Evaluation  07/16/19    Authorization Type  8    Authorization Time Period  of 10 progress note Medicare    PT Start Time  1304    PT Stop Time  1346    PT Time Calculation (min)  42 min    Activity Tolerance  Patient tolerated treatment well    Behavior During Therapy  [facility] for tasks assessed/performed       Past Medical History:  Diagnosis Date  . ADD (attention deficit disorder)   . Anemia   . Anginal pain ([place])    pt states has occas chest pain relates to indigestion; pt uses rest to relieve   . Anxiety   . Arthritis   . Concussion   . Depression   . Diabetes mellitus without complication ([place])   . Dizziness   . Fall   . GERD (gastroesophageal reflux disease)   . Headache   . History of urinary tract infection   . Hyperlipidemia   . Hypertension   . Hypothyroidism   . IBS (irritable bowel syndrome)   . Imbalance   . Numbness    right leg   . Numbness in both hands    comes and goes   . Pneumonia    last episode approx 1 year ago   . Sleep apnea   . Wears glasses     Past Surgical History:  Procedure Laterality Date  . BREAST CYST ASPIRATION Left 1980's   neg  . BREAST CYST EXCISION Right 1980's   neg  . BREAST LUMPECTOMY Right   . BREAST SURGERY    . CARPAL TUNNEL RELEASE    . CESAREAN SECTION     times 2  . COLONOSCOPY WITH PROPOFOL N/A 05/11/2015   Procedure: COLONOSCOPY WITH PROPOFOL;  Surgeon: Manya Silvas, MD;  Location[facility] ENDOSCOPY;  Service: Endoscopy;  Laterality: N/A;  . DE QUERVAIN'S RELEASE Right   .  ESOPHAGOGASTRODUODENOSCOPY (EGD) WITH PROPOFOL N/A 05/11/2015   Procedure: ESOPHAGOGASTRODUODENOSCOPY (EGD) WITH PROPOFOL;  Surgeon: Manya Silvas, MD;  Location: [facility] ENDOSCOPY;  Service: Endoscopy;  Laterality: N/A;  . EYE SURGERY     laser surgery bilat   . HERNIA REPAIR    . KNEE ARTHROSCOPY    . LUMBAR LAMINECTOMY/DECOMPRESSION MICRODISCECTOMY Bilateral 09/14/2015   Procedure: MICRO LUMBAR BILATERAL DECOMPRESSION L4 - L5;  Surgeon: Susa Day, MD;  Location: [facility] ORS;  Service: Orthopedics;  Laterality: Bilateral;  . REDUCTION MAMMAPLASTY Bilateral 1980  . RHINOPLASTY    . TONSILLECTOMY    . TOTAL KNEE ARTHROPLASTY Left 06/18/2016   Procedure: LEFT TOTAL KNEE ARTHROPLASTY;  Surgeon: Paralee Cancel, MD;  Location: [facility] ORS;  Service: Orthopedics;  Laterality: Left;  Adductor Block  . TUBAL LIGATION    . UVULOPALATOPHARYNGOPLASTY      There were no vitals filed for this visit.  Subjective Assessment - 05/20/19 1305    Subjective  Dizziness seems ok. Has been carefull about the bending over. The back and knee still hurts. Back feels better currently  compared to the last 2 days.  The exercises help sometimes. Back sometimes bother her at night when she sleeps. Can find some type of relief to get it to stop.  6/10 low back pain currently (taking a nice warm shower followed by exercises helps take the pain go away), 6/10 low back pain at most if there is no spasm, 7.5/10 at most with spasm.  0/10 L knee pain currently (just stiffness), 5.5/10 L knee pain at most for the past 7 days. 0/10 R knee pain currently (5.5/10 R knee pain at most for the past 7 days). Able to get into the bath tub is fine. Getting out is easier but still has difficulty wiht bending her L knee. Has not been really stretching the L knee.  Getting her 2nd Covid shot today. Was tired for about a week after her first shot.  Feels like PT is helping her.  Going to PT helps refocus her to know what she should be doing to help  decrease her pain and feel better so she can do more things at home.   Has not noticed vision symptoms this past week.    Pertinent History  Low back pain. Pt states having back surgery about 2-3 years ago. After she had her L TKA, her back started aggravating her due to her walking. Pt also fell about 4 weeks ago onto her L knee. Dr. Alvan Dame checked out her L knee. L knee was fine but has some inflammation.  Pt tries to keep it iced. Pt still recovering for her L TKA but the fall set her back.   Her L knee surgery was last year.  L knee still bothers her a lot.  Had surgery for her back before her L knee surgery. Was doing well until her L knee surgery. The fall made it worse. Feels debilitating. Also has a hard time getting into and out of the car mainly due to her L knee.   Pt fell due to her L knee bucking on her. Pt was trying to pick something up from the side of the couch.  Also has a hard time getting up from the floor.  Pt states having bladder accidents since after her fall (pt was recommended to tell her MD about it).  Bowel problems since taking medications (was constapated, but after taking medications, pt had diarrhea which was difficult to control. Getting out of the metformin fixed the bowel issue).  Pt states tingling and numbness L lateral LE along the L5 dermatome.  Denies saddle anesthesia.  Pt states that her back problem is mainly on her L side.  Pt landed on her L knee when she fell.  No other falls within the last 6 months. Pt also states being very dizzy since her L knee surgery.  Had PT treatement for her dizziness before which did not help.  The room feels like it is spinning when she gets dizzy.  I feel like it is getting better then she does something.  5/10 low back soreness currently (7/10 at most for the past 7 days after negotiating stairs, 5/10 without doing stairs). 4/10 L knee pain currenglty with gait 5/10 L knee pain at most for the past 7 days.    Patient Stated Goals  Be better  able to get into and out of her vehicle (midsized truck), into and out of chairs, be better able to roll in her bed with less L knee pain. Be able to get down  on the floor and get back up.     Currently in Pain?  Yes    Pain Onset  More than a month ago                               PT Education - 05/20/19 1351    Education Details  ther-ex, perform her HEP, plan of care    Person(s) Educated  Patient    Methods  Explanation;Demonstration;Tactile cues;Verbal cues    Comprehension  Returned demonstration;Verbalized understanding         Objectives   MedbridgeAccess Code: [email or web address]  Latex free bands usedif used   Sitting posture: L trunk rotation  Standing posture: slightLlateral shift  Pt states that pressing her back against the chair feels better.  Gait observation: increased L lateral lean during L LE stance phase.      Last scheduled appointment: 07/01/19   Therapeutic exercise   Increased time listening to pt subjective.   Pt was recommended to do the physioball rolls to the R at night to help improve back comfort during sleep.  Pt demonstrated and verbalized undrstanding.   Pt was recommended to perform seated L knee flexion AAROM to the point where she actually feels a stretch. To improve ROM and ability to step out of her bath tub. Pt demonstrated and verbalized undrstanding.     Turning 360 degrees   To the R 1x. Dizziness. Unsure if the room is spinning. Feels swimmy headed  To the L 1x. Dizziness more than R rotatinon. Does not know if room is spinning.    Pt was recommended to do her Epley exercise at home since pt said it helps (rarely performs)   Then to sleep on a recliner aferwards for 1 night afterwards to help maintain corrected inner ear crystal position.  Pt demonstrated and verbalized undrstanding.     Improved exercise technique, movement at target joints, use of target muscles after mod  verbal, visual, tactile cues.      Manual therapy  Seated STM R > L upper trap muscles  Supine STM B cervical paraspinal muscles to decrease tension    No change in dizziness     Response to treatment Pt tolerated session well without aggravation of symptoms.    Clinical impression Pt demonstrates overall improved ability to step into and out of her bath tub, as well as decreasing R knee pain. Similar pain levels for her low back, L knee, and R hip. Challenges to progress include inconsistency in or not performing her HEP, chronicity of condition, as well as multiple areas of pain and symptoms which complicate progress (low back, L knee, R knee, R hip pain, as well as dizziness/vertigo). Pt however states that physical therapy helps her and going to the sessions helps her re-focus and figure out what to do at home to help her decrease pain, and symptoms to help her perform her daily tasks better and more comfortably. Pt does however, demonstrate overall improvement in pain and dizziness after her sessions consistently. Pt will benefit from continued skilled physical therapy services to maintain her current level of comfort and function and prevent regression and possibly improve pain and function.     PT Short Term Goals - 12/12/17 0930      PT SHORT TERM GOAL #1   Title  Patient will be independent with her HEP to help decrease back and L knee pain and improve  ability to perform functional tasks.     Time  3    Period  Weeks    Status  Achieved    Target Date  12/12/17        PT Long Term Goals - 05/20/19 1322      PT LONG TERM GOAL #1   Title  Patient will have a decrease in back pain to 3/10 or less at worst to promote ability to ambulate, turn in bed, perform standing tasks with less pain.     Baseline  6/10 back pain at most (09/24/2017); 7-8/10 at most for the past 7 days, duration of pain is better since starting PT (10/29/2017); 5-6/10 at most for the past 7  days (11/18/2017); 8/10 at most for the past 7 days but the duration of pain is less compared to prior to starting PT (12/12/2017).  7/10 back pain at most for the past 7 days (pt states doing her exercises helps decrease her pain; 01/02/2018); walking (3/10), standing (4/10), bed mobility (0/10) 02/05/2018; 6/10 back pain at most for the past 7 days (03/03/2018); 6.5/10 back pain at most for the past 7 days (03/27/2018); 7-8/10 at most (05/08/2018); 7/10 back pain at worst (07/22/2018);  7-8/10 at most for the past 7 days (09/22/2018); 5-7/10 at worst for the past 7 days then calms down (10/27/2018); 6/10 back pain at most for the past 7 days (when pt cooks and cleans up in kitchen; 12/17/2018); 6-7/10 back spams pain at most for the past 7 days, usually when she is doing stuff in the kitchen (01/29/2019); 7/10 at worst for the past 7 days (03/25/2019); 7.5/10 (05/20/19)    Time  8    Period  Weeks    Status  On-going    Target Date  07/16/19      PT LONG TERM GOAL #2   Title  Patient will have a decrease in L knee pain to 3/10 or less at worst to promote ability to ambulate, negotiate stairs, get into and out of a car more comfortably.     Baseline  6/10 L knee pain at most for the past 3 months (09/24/2017); 7-8/10 at most for the past 7 days, duration of pain is better since starting PT (10/29/2017); 7/10 at most for the past 7 days (11/18/2017); 6/10 L knee pain at most for the past 7 days (12/12/2017); 5/10 L knee pain at most for the past 7 days (pt states doing her exercises helps decrease pain; 01/02/2018); L knee 3/10 (02/05/2018); 4/10 at most for the past 7 days (03/03/2018); 5.5/10 L knee at most for the past 7 days (03/27/2018); 6-7/10 L knee pain at most (05/08/2018); 7/10 L knee pain at worst for the past month (07/22/2018);  6/10 L knee pain at most for the past 7 days (09/22/2018); 5-6/10 at most for the past 7 days sporadically, can get it better with exercise (10/27/2018); 6/10 L knee ache at most for the past 7  days (12/17/2018); 5-6/10 L knee pain at most for the past 7 days such as going up and down steps (01/29/2019); 5/10 at most for the past 7 days (03/25/2019); 5.5/10 (05/20/19)    Time  8    Period  Weeks    Status  On-going    Target Date  07/16/19      PT LONG TERM GOAL #3   Title  Patient will improve TUG time to 12 seconds or less as a demonstration of improved functional mobility and balance.  Baseline  TUG no AD: 16.05 seconds on average (09/24/2017); 14.3 seconds on average (10/29/2017); 13 seconds average (11/21/2017); 16.67 seconds average (12/12/2017);  14.17 seconds average (01/02/2018); 11.8 sec sonds (02/05/2018)    Time  6    Period  Weeks    Status  Achieved      PT LONG TERM GOAL #4   Title  Patient will improve her back FOTO score by at least 10 points as a demonstration of improved funtion.      Baseline  Back FOTO: 33 (09/24/2017); 38 (10/29/2017); 36 (11/21/2017); 40 (12/12/2017); 42 (01/02/2018); 47 (03/03/2018)    Time  6    Period  Weeks    Status  Achieved      PT LONG TERM GOAL #5   Title  Patient will improve bilateral LE strength by at least 1/2 MMT grade to promote ability to perform standing tasks.     Time  6    Period  Weeks    Status  Achieved      PT LONG TERM GOAL #6   Title  Patient will improve her Modified Oswestry Low Back pain disablity questionnaire by at least 10% as a demonstration of improved function.     Baseline  48% (09/24/2017); 46% (10/29/2017), (11/21/2017); 56% (12/12/2017); 36% (01/02/2018), (03/03/2018)    Time  6    Period  Weeks    Status  Achieved      PT LONG TERM GOAL #7   Title  Pt will report decreased difficulty stepping into and out of her bathtub as well as getting into and out of her truck to promote ability to get into and out of places.     Baseline  Pt reports difficulty stepping into and out of her bathtub as well as difficulty getting into and out of her truck (01/02/2018); Pt states the truck and bath tub and difficult and painful.   (02/05/2018); Improving per subjective reports (03/03/2018); decreased difficulty with truck tranfser after cues for technique and practice, decreasing difficulty with stepping into and out of the bath tub based on subjective reports (03/27/2018 and 04/01/2018); Decreasing difficulty stepping into a bathtub. No difficulty getting into and out of her truck (05/08/2018), (07/22/2018); Difficulty stepping into and out of the bath tub. No difficulty with truck transfers (09/22/2018); Able to get into her bath tub, difficulty getting out (10/27/18); Still has difficulty getting into and out of bathtub (02/23/2019); Better able to step into the bathtub. Still has difficulty stepping out of the bathtub due to knee flexion (03/25/2019); No difficulty stepping into her bath tub. Still has difficulty stepping out due to decreased L knee flexion ROM (05/20/2019)    Time  6    Period  Weeks    Status  Partially Met    Target Date  07/02/19      PT LONG TERM GOAL #8   Title  Patient will improve her BERG balance test score to 46/56 or more as a demonstration of decreased fall risk     Baseline  42/56 (01/02/2018); 54/56 (02/05/2018)    Time  6    Period  Weeks    Status  Achieved      PT LONG TERM GOAL  #9   TITLE  Pt will report minimal to no dizziness related to vertigo to promote balance and decrease fall risk.     Baseline  Pt reports dizziness (related to vertigo) which seem to affect her balance (01/15/2018); Patient reports "a little bit  every now and then" (02/05/2018); Pt reports dizziness and decreased balance (01/29/2019); Dizziness has improved some (02/23/2019), (03/25/2019)    Time  8    Period  Weeks    Status  On-going    Target Date  07/16/19      PT LONG TERM GOAL  #10   TITLE  Pt will be able to turn 360 degrees to the R and L at least 3 times without dizziness and no LOB to promote safety and decrease fall risk.    Baseline  360 degree turn to the R and L causes a loss of balance (12/17/2018),  (01/29/2019). able to perform 3 times with each side with improved balance, minimal dizziness. (02/23/2019); Dizziness (05/20/19)    Time  8    Period  Weeks    Status  On-going    Target Date  07/16/19      PT LONG TERM GOAL  #11   TITLE  Patient will have a decrease in R hip and knee pain to 4/10 or less at worst to promote ability to ambulate with less discomfort.    Baseline  7/10 R hip and knee pain at worst for the past month (03/25/2019); 5/10 R hip pain, 6/10 R knee pain at most for the past 7 days (05/20/19); 5.5/10 R knee pain (05/20/19)    Time  8    Period  Weeks    Status  Partially Met    Target Date  07/16/19            Plan - 05/20/19 1721    Clinical Impression Statement  Pt demonstrates overall improved ability to step into and out of her bath tub, as well as decreasing R knee pain. Similar pain levels for her low back, L knee, and R hip. Challenges to progress include inconsistency in or not performing her HEP, chronicity of condition, as well as multiple areas of pain and symptoms which complicate progress (low back, L knee, R knee, R hip pain, as well as dizziness/vertigo). Pt however states that physical therapy helps her and going to the sessions helps her re-focus and figure out what to do at home to help her decrease pain, and symptoms to help her perform her daily tasks better and more comfortably. Pt does however, demonstrate overall improvement in pain and dizziness after her sessions consistently. Pt will benefit from continued skilled physical therapy services to maintain her current level of comfort and function and prevent regression and possibly improve pain and function.    Personal Factors and Comorbidities  Age;Comorbidity 3+;Time since onset of injury/illness/exacerbation;Fitness;Past/Current Experience    Comorbidities  Back, knee pain, hx of dizziness, hx of back surgery, hx of C-section    Examination-Activity Limitations  Squat    Stability/Clinical  Decision Making  Evolving/Moderate complexity    Clinical Decision Making  Moderate    Clinical Presentation due to:  multiple areas of pain, inconsistency with HEP, recurrence of dizziness    Rehab Potential  Fair    Clinical Impairments Affecting Rehab Potential  difficulty with carry-over of improvements in clinic, chronicity of condition, age, multiple problem areas    PT Frequency  1x / week    PT Duration  8 weeks    PT Treatment/Interventions  Therapeutic activities;Therapeutic exercise;Balance training;Neuromuscular re-education;Patient/family education;Manual techniques;Dry needling;Aquatic Therapy;Electrical Stimulation;Iontophoresis 3m/ml Dexamethasone;Gait training;Canalith Repostioning;Vestibular    PT Next Visit Plan   hip and knee strengthening, core strengthening, patellar mobility, manual techniques, modalities PRN    Consulted and  Agree with Plan of Care  Patient       Patient will benefit from skilled therapeutic intervention in order to improve the following deficits and impairments:  Pain, Improper body mechanics, Postural dysfunction, Dizziness, Decreased strength, Difficulty walking, Decreased balance  Visit Diagnosis: Muscle weakness (generalized) - Plan: PT plan of care cert/re-cert  History of falling - Plan: PT plan of care cert/re-cert  Dizziness and giddiness - Plan: PT plan of care cert/re-cert  Radiculopathy, lumbar region - Plan: PT plan of care cert/re-cert  Pain in right hip - Plan: PT plan of care cert/re-cert  Right knee pain, unspecified chronicity - Plan: PT plan of care cert/re-cert  Chronic pain of left knee - Plan: PT plan of care cert/re-cert     Problem List Patient Active Problem List   Diagnosis Date Noted  . Obese 06/20/2016  . Status post right knee replacement 06/19/2016  . S/P left TKA 06/18/2016  . Spinal stenosis of lumbar region 09/14/2015    Joneen Boers PT, DPT   05/20/2019, 5:41 PM  [facility] [address], [place], [address] Phone: [phone]   Fax:  [phone]  Name: Destiny Shaw MRN: [id number] Date of Birth: [birth year]

## 2019-06-03 ENCOUNTER — Ambulatory Visit: Payer: Medicare Other | Attending: Orthopedic Surgery

## 2019-06-03 ENCOUNTER — Other Ambulatory Visit: Payer: Self-pay

## 2019-06-03 DIAGNOSIS — R42 Dizziness and giddiness: Secondary | ICD-10-CM | POA: Diagnosis present

## 2019-06-03 DIAGNOSIS — M25562 Pain in left knee: Secondary | ICD-10-CM | POA: Insufficient documentation

## 2019-06-03 DIAGNOSIS — G8929 Other chronic pain: Secondary | ICD-10-CM | POA: Diagnosis present

## 2019-06-03 DIAGNOSIS — M6281 Muscle weakness (generalized): Secondary | ICD-10-CM

## 2019-06-03 DIAGNOSIS — M25561 Pain in right knee: Secondary | ICD-10-CM | POA: Diagnosis present

## 2019-06-03 DIAGNOSIS — R41841 Cognitive communication deficit: Secondary | ICD-10-CM | POA: Diagnosis present

## 2019-06-03 DIAGNOSIS — Z9181 History of falling: Secondary | ICD-10-CM

## 2019-06-03 DIAGNOSIS — M25551 Pain in right hip: Secondary | ICD-10-CM | POA: Diagnosis present

## 2019-06-03 DIAGNOSIS — M5416 Radiculopathy, lumbar region: Secondary | ICD-10-CM

## 2019-06-03 NOTE — Therapy (Signed)
[facility] [address] [place], [address] Phone: [phone]   Fax:  [phone]  Physical Therapy Treatment  Patient Details  Name: Destiny Shaw MRN: [id number] Date of Birth: [birth year] Referring Provider (PT): Paralee Cancel, MD   Encounter Date: 06/03/2019  PT End of Session - 06/03/19 1310    Visit Number  64    Number of Visits  102    Date for PT Re-Evaluation  07/16/19    Authorization Type  9    Authorization Time Period  of 10 progress note Medicare    PT Start Time  1302    PT Stop Time  1345    PT Time Calculation (min)  43 min    Equipment Utilized During Treatment  Gait belt    Activity Tolerance  Patient tolerated treatment well    Behavior During Therapy  WFL for tasks assessed/performed       Past Medical History:  Diagnosis Date  . ADD (attention deficit disorder)   . Anemia   . Anginal pain ([place])    pt states has occas chest pain relates to indigestion; pt uses rest to relieve   . Anxiety   . Arthritis   . Concussion   . Depression   . Diabetes mellitus without complication ([place])   . Dizziness   . Fall   . GERD (gastroesophageal reflux disease)   . Headache   . History of urinary tract infection   . Hyperlipidemia   . Hypertension   . Hypothyroidism   . IBS (irritable bowel syndrome)   . Imbalance   . Numbness    right leg   . Numbness in both hands    comes and goes   . Pneumonia    last episode approx 1 year ago   . Sleep apnea   . Wears glasses     Past Surgical History:  Procedure Laterality Date  . BREAST CYST ASPIRATION Left 1980's   neg  . BREAST CYST EXCISION Right 1980's   neg  . BREAST LUMPECTOMY Right   . BREAST SURGERY    . CARPAL TUNNEL RELEASE    . CESAREAN SECTION     times 2  . COLONOSCOPY WITH PROPOFOL N/A 05/11/2015   Procedure: COLONOSCOPY WITH PROPOFOL;  Surgeon: Manya Silvas, MD;  Location: [facility] ENDOSCOPY;  Service: Endoscopy;   Laterality: N/A;  . DE QUERVAIN'S RELEASE Right   . ESOPHAGOGASTRODUODENOSCOPY (EGD) WITH PROPOFOL N/A 05/11/2015   Procedure: ESOPHAGOGASTRODUODENOSCOPY (EGD) WITH PROPOFOL;  Surgeon: Manya Silvas, MD;  Location: [facility] ENDOSCOPY;  Service: Endoscopy;  Laterality: N/A;  . EYE SURGERY     laser surgery bilat   . HERNIA REPAIR    . KNEE ARTHROSCOPY    . LUMBAR LAMINECTOMY/DECOMPRESSION MICRODISCECTOMY Bilateral 09/14/2015   Procedure: MICRO LUMBAR BILATERAL DECOMPRESSION L4 - L5;  Surgeon: Susa Day, MD;  Location: [facility] ORS;  Service: Orthopedics;  Laterality: Bilateral;  . REDUCTION MAMMAPLASTY Bilateral 1980  . RHINOPLASTY    . TONSILLECTOMY    . TOTAL KNEE ARTHROPLASTY Left 06/18/2016   Procedure: LEFT TOTAL KNEE ARTHROPLASTY;  Surgeon: Paralee Cancel, MD;  Location: [facility] ORS;  Service: Orthopedics;  Laterality: Left;  Adductor Block  . TUBAL LIGATION    . UVULOPALATOPHARYNGOPLASTY      There were no vitals filed for this visit.  Subjective Assessment - 06/03/19 1308    Subjective  Patient reported that her dizziness is not a problem today,  stated she is mostly adjusted to it. Stated her main problem today are her knees and her R hip.    Patient is accompained by:  Family member    Pertinent History  Low back pain. Pt states having back surgery about 2-3 years ago. After she had her L TKA, her back started aggravating her due to her walking. Pt also fell about 4 weeks ago onto her L knee. Dr. Alvan Dame checked out her L knee. L knee was fine but has some inflammation.  Pt tries to keep it iced. Pt still recovering for her L TKA but the fall set her back.   Her L knee surgery was last year.  L knee still bothers her a lot.  Had surgery for her back before her L knee surgery. Was doing well until her L knee surgery. The fall made it worse. Feels debilitating. Also has a hard time getting into and out of the car mainly due to her L knee.   Pt fell due to her L knee bucking on her. Pt was trying to  pick something up from the side of the couch.  Also has a hard time getting up from the floor.  Pt states having bladder accidents since after her fall (pt was recommended to tell her MD about it).  Bowel problems since taking medications (was constapated, but after taking medications, pt had diarrhea which was difficult to control. Getting out of the metformin fixed the bowel issue).  Pt states tingling and numbness L lateral LE along the L5 dermatome.  Denies saddle anesthesia.  Pt states that her back problem is mainly on her L side.  Pt landed on her L knee when she fell.  No other falls within the last 6 months. Pt also states being very dizzy since her L knee surgery.  Had PT treatement for her dizziness before which did not help.  The room feels like it is spinning when she gets dizzy.  I feel like it is getting better then she does something.  5/10 low back soreness currently (7/10 at most for the past 7 days after negotiating stairs, 5/10 without doing stairs). 4/10 L knee pain currenglty with gait 5/10 L knee pain at most for the past 7 days.    Patient Stated Goals  Be better able to get into and out of her vehicle (midsized truck), into and out of chairs, be better able to roll in her bed with less L knee pain. Be able to get down on the floor and get back up.     Currently in Pain?  Yes    Pain Score  6    at rest 3/10 of bilateral knees and hips. with movement and transfers, 6-7/10.   Pain Location  Back   L and R knee, R hip   Pain Orientation  Right;Left    Pain Descriptors / Indicators  Tender    Pain Type  Chronic pain        Objective: Pt with hunched  Posture in ambulation, flexed trunk  Reported bilateral knee and r hip pain at start of session..  Lumbar spine pain 6/10 today, would like to focus on back this session.   Extended time needed for subjective assessment   Therapeutic exercise: PT assisted with piriformis stretch bilaterally 3x30sec Discussed stretches for  home, as well as use of tennis ball for manual therapy.  Manual therapy: STM superficial and deep technique utilized on posterior including superior glute fibers,  deep hip muscles such as glute med and piriformis, lumbar paraspinals    pt response/clinical impression: Low back pain decreased from 6/10 to 3/10 with therapy session today, pt exhibited improved upright posture and gait velocity compared to entering clinic as well. The patient would benefit from further skilled PT intervention to continue to progress towards goals to optimize ability to perform functional/recreational activities.    PT Education - 06/03/19 1300    Education Details  therex, exercise form    Person(s) Educated  Patient    Methods  Explanation;Demonstration;Tactile cues;Verbal cues    Comprehension  Verbalized understanding;Returned demonstration       PT Short Term Goals - 12/12/17 0930      PT SHORT TERM GOAL #1   Title  Patient will be independent with her HEP to help decrease back and L knee pain and improve ability to perform functional tasks.     Time  3    Period  Weeks    Status  Achieved    Target Date  12/12/17        PT Long Term Goals - 05/20/19 1322      PT LONG TERM GOAL #1   Title  Patient will have a decrease in back pain to 3/10 or less at worst to promote ability to ambulate, turn in bed, perform standing tasks with less pain.     Baseline  6/10 back pain at most (09/24/2017); 7-8/10 at most for the past 7 days, duration of pain is better since starting PT (10/29/2017); 5-6/10 at most for the past 7 days (11/18/2017); 8/10 at most for the past 7 days but the duration of pain is less compared to prior to starting PT (12/12/2017).  7/10 back pain at most for the past 7 days (pt states doing her exercises helps decrease her pain; 01/02/2018); walking (3/10), standing (4/10), bed mobility (0/10) 02/05/2018; 6/10 back pain at most for the past 7 days (03/03/2018); 6.5/10 back pain at most for the  past 7 days (03/27/2018); 7-8/10 at most (05/08/2018); 7/10 back pain at worst (07/22/2018);  7-8/10 at most for the past 7 days (09/22/2018); 5-7/10 at worst for the past 7 days then calms down (10/27/2018); 6/10 back pain at most for the past 7 days (when pt cooks and cleans up in kitchen; 12/17/2018); 6-7/10 back spams pain at most for the past 7 days, usually when she is doing stuff in the kitchen (01/29/2019); 7/10 at worst for the past 7 days (03/25/2019); 7.5/10 (05/20/19)    Time  8    Period  Weeks    Status  On-going    Target Date  07/16/19      PT LONG TERM GOAL #2   Title  Patient will have a decrease in L knee pain to 3/10 or less at worst to promote ability to ambulate, negotiate stairs, get into and out of a car more comfortably.     Baseline  6/10 L knee pain at most for the past 3 months (09/24/2017); 7-8/10 at most for the past 7 days, duration of pain is better since starting PT (10/29/2017); 7/10 at most for the past 7 days (11/18/2017); 6/10 L knee pain at most for the past 7 days (12/12/2017); 5/10 L knee pain at most for the past 7 days (pt states doing her exercises helps decrease pain; 01/02/2018); L knee 3/10 (02/05/2018); 4/10 at most for the past 7 days (03/03/2018); 5.5/10 L knee at most for the past 7 days (  03/27/2018); 6-7/10 L knee pain at most (05/08/2018); 7/10 L knee pain at worst for the past month (07/22/2018);  6/10 L knee pain at most for the past 7 days (09/22/2018); 5-6/10 at most for the past 7 days sporadically, can get it better with exercise (10/27/2018); 6/10 L knee ache at most for the past 7 days (12/17/2018); 5-6/10 L knee pain at most for the past 7 days such as going up and down steps (01/29/2019); 5/10 at most for the past 7 days (03/25/2019); 5.5/10 (05/20/19)    Time  8    Period  Weeks    Status  On-going    Target Date  07/16/19      PT LONG TERM GOAL #3   Title  Patient will improve TUG time to 12 seconds or less as a demonstration of improved functional mobility and  balance.     Baseline  TUG no AD: 16.05 seconds on average (09/24/2017); 14.3 seconds on average (10/29/2017); 13 seconds average (11/21/2017); 16.67 seconds average (12/12/2017);  14.17 seconds average (01/02/2018); 11.8 sec sonds (02/05/2018)    Time  6    Period  Weeks    Status  Achieved      PT LONG TERM GOAL #4   Title  Patient will improve her back FOTO score by at least 10 points as a demonstration of improved funtion.      Baseline  Back FOTO: 33 (09/24/2017); 38 (10/29/2017); 36 (11/21/2017); 40 (12/12/2017); 42 (01/02/2018); 47 (03/03/2018)    Time  6    Period  Weeks    Status  Achieved      PT LONG TERM GOAL #5   Title  Patient will improve bilateral LE strength by at least 1/2 MMT grade to promote ability to perform standing tasks.     Time  6    Period  Weeks    Status  Achieved      PT LONG TERM GOAL #6   Title  Patient will improve her Modified Oswestry Low Back pain disablity questionnaire by at least 10% as a demonstration of improved function.     Baseline  48% (09/24/2017); 46% (10/29/2017), (11/21/2017); 56% (12/12/2017); 36% (01/02/2018), (03/03/2018)    Time  6    Period  Weeks    Status  Achieved      PT LONG TERM GOAL #7   Title  Pt will report decreased difficulty stepping into and out of her bathtub as well as getting into and out of her truck to promote ability to get into and out of places.     Baseline  Pt reports difficulty stepping into and out of her bathtub as well as difficulty getting into and out of her truck (01/02/2018); Pt states the truck and bath tub and difficult and painful.  (02/05/2018); Improving per subjective reports (03/03/2018); decreased difficulty with truck tranfser after cues for technique and practice, decreasing difficulty with stepping into and out of the bath tub based on subjective reports (03/27/2018 and 04/01/2018); Decreasing difficulty stepping into a bathtub. No difficulty getting into and out of her truck (05/08/2018), (07/22/2018); Difficulty  stepping into and out of the bath tub. No difficulty with truck transfers (09/22/2018); Able to get into her bath tub, difficulty getting out (10/27/18); Still has difficulty getting into and out of bathtub (02/23/2019); Better able to step into the bathtub. Still has difficulty stepping out of the bathtub due to knee flexion (03/25/2019); No difficulty stepping into her bath tub. Still has difficulty  stepping out due to decreased L knee flexion ROM (05/20/2019)    Time  6    Period  Weeks    Status  Partially Met    Target Date  07/02/19      PT LONG TERM GOAL #8   Title  Patient will improve her BERG balance test score to 46/56 or more as a demonstration of decreased fall risk     Baseline  42/56 (01/02/2018); 54/56 (02/05/2018)    Time  6    Period  Weeks    Status  Achieved      PT LONG TERM GOAL  #9   TITLE  Pt will report minimal to no dizziness related to vertigo to promote balance and decrease fall risk.     Baseline  Pt reports dizziness (related to vertigo) which seem to affect her balance (01/15/2018); Patient reports "a little bit every now and then" (02/05/2018); Pt reports dizziness and decreased balance (01/29/2019); Dizziness has improved some (02/23/2019), (03/25/2019)    Time  8    Period  Weeks    Status  On-going    Target Date  07/16/19      PT LONG TERM GOAL  #10   TITLE  Pt will be able to turn 360 degrees to the R and L at least 3 times without dizziness and no LOB to promote safety and decrease fall risk.    Baseline  360 degree turn to the R and L causes a loss of balance (12/17/2018), (01/29/2019). able to perform 3 times with each side with improved balance, minimal dizziness. (02/23/2019); Dizziness (05/20/19)    Time  8    Period  Weeks    Status  On-going    Target Date  07/16/19      PT LONG TERM GOAL  #11   TITLE  Patient will have a decrease in R hip and knee pain to 4/10 or less at worst to promote ability to ambulate with less discomfort.    Baseline  7/10 R  hip and knee pain at worst for the past month (03/25/2019); 5/10 R hip pain, 6/10 R knee pain at most for the past 7 days (05/20/19); 5.5/10 R knee pain (05/20/19)    Time  8    Period  Weeks    Status  Partially Met    Target Date  07/16/19            Plan - 06/03/19 1301    Clinical Impression Statement  Low back pain decreased from 6/10 to 3/10 with therapy session today, pt exhibited improved upright posture and gait velocity compared to entering clinic as well. The patient would benefit from further skilled PT intervention to continue to progress towards goals to optimize ability to perform functional/recreational activities.    Personal Factors and Comorbidities  Age;Comorbidity 3+;Time since onset of injury/illness/exacerbation;Fitness;Past/Current Experience    Comorbidities  Back, knee pain, hx of dizziness, hx of back surgery, hx of C-section    Examination-Activity Limitations  Squat    Stability/Clinical Decision Making  Evolving/Moderate complexity    Rehab Potential  Fair    Clinical Impairments Affecting Rehab Potential  difficulty with carry-over of improvements in clinic, chronicity of condition, age, multiple problem areas    PT Frequency  1x / week    PT Duration  8 weeks    PT Treatment/Interventions  Therapeutic activities;Therapeutic exercise;Balance training;Neuromuscular re-education;Patient/family education;Manual techniques;Dry needling;Aquatic Therapy;Electrical Stimulation;Iontophoresis 52m/ml Dexamethasone;Gait training;Canalith Repostioning;Vestibular    PT Next Visit Plan  hip and knee strengthening, core strengthening, patellar mobility, manual techniques, modalities PRN    Consulted and Agree with Plan of Care  Patient       Patient will benefit from skilled therapeutic intervention in order to improve the following deficits and impairments:  Pain, Improper body mechanics, Postural dysfunction, Dizziness, Decreased strength, Difficulty walking, Decreased  balance  Visit Diagnosis: Muscle weakness (generalized)  History of falling  Radiculopathy, lumbar region  Pain in right hip  Dizziness and giddiness  Right knee pain, unspecified chronicity  Chronic pain of left knee  Cognitive communication deficit     Problem List Patient Active Problem List   Diagnosis Date Noted  . Obese 06/20/2016  . Status post right knee replacement 06/19/2016  . S/P left TKA 06/18/2016  . Spinal stenosis of lumbar region 09/14/2015    Lieutenant Diego PT, DPT 2:04 PM,06/03/19   [facility] [address], [place], [address] Phone: [phone]   Fax:  [phone]  Name: IDELIA CAUDELL MRN: [id number] Date of Birth: Dec 16, 1950

## 2019-06-10 ENCOUNTER — Ambulatory Visit: Payer: Medicare Other

## 2019-06-10 ENCOUNTER — Other Ambulatory Visit: Payer: Self-pay

## 2019-06-10 DIAGNOSIS — M6281 Muscle weakness (generalized): Secondary | ICD-10-CM

## 2019-06-10 DIAGNOSIS — M25551 Pain in right hip: Secondary | ICD-10-CM

## 2019-06-10 DIAGNOSIS — G8929 Other chronic pain: Secondary | ICD-10-CM

## 2019-06-10 DIAGNOSIS — M25561 Pain in right knee: Secondary | ICD-10-CM

## 2019-06-10 DIAGNOSIS — M5416 Radiculopathy, lumbar region: Secondary | ICD-10-CM

## 2019-06-10 DIAGNOSIS — Z9181 History of falling: Secondary | ICD-10-CM

## 2019-06-10 DIAGNOSIS — R42 Dizziness and giddiness: Secondary | ICD-10-CM

## 2019-06-10 NOTE — Patient Instructions (Addendum)
If your right hip or low back hurts, do this      Seated hip extension isometrics   Sitting on a chair,    Squeeze your rear end muscles together and press your right foot onto the floor.    (pillow under right thigh)   Hold for 5 seconds    Repeat 10 times   Perform 3 sets daily.      This is a corrective exercise. Once you no longer have symptoms, you can stop.      You can also do the ball stretch to the right (sitting on your chair)

## 2019-06-10 NOTE — Therapy (Signed)
[facility] [address] [place], [address] Phone: [phone]   Fax:  [phone]  Physical Therapy Treatment And Progress Report (03/05/2019 - 06/10/2019)  Patient Details  Name: Destiny Shaw MRN: [id number] Date of Birth: Sep 27, 1950 Referring Provider (PT): Paralee Cancel, MD   Encounter Date: 06/10/2019  PT End of Session - 06/10/19 1604    Visit Number  70    Number of Visits  102    Date for PT Re-Evaluation  07/16/19    Authorization Type  10    Authorization Time Period  of 10 progress note Medicare    PT Start Time  1604    PT Stop Time  1644    PT Time Calculation (min)  40 min    Equipment Utilized During Treatment  Gait belt    Activity Tolerance  Patient tolerated treatment well    Behavior During Therapy  WFL for tasks assessed/performed       Past Medical History:  Diagnosis Date  . ADD (attention deficit disorder)   . Anemia   . Anginal pain ([place])    pt states has occas chest pain relates to indigestion; pt uses rest to relieve   . Anxiety   . Arthritis   . Concussion   . Depression   . Diabetes mellitus without complication ([place])   . Dizziness   . Fall   . GERD (gastroesophageal reflux disease)   . Headache   . History of urinary tract infection   . Hyperlipidemia   . Hypertension   . Hypothyroidism   . IBS (irritable bowel syndrome)   . Imbalance   . Numbness    right leg   . Numbness in both hands    comes and goes   . Pneumonia    last episode approx 1 year ago   . Sleep apnea   . Wears glasses     Past Surgical History:  Procedure Laterality Date  . BREAST CYST ASPIRATION Left 1980's   neg  . BREAST CYST EXCISION Right 1980's   neg  . BREAST LUMPECTOMY Right   . BREAST SURGERY    . CARPAL TUNNEL RELEASE    . CESAREAN SECTION     times 2  . COLONOSCOPY WITH PROPOFOL N/A 05/11/2015   Procedure: COLONOSCOPY WITH PROPOFOL;  Surgeon: Manya Silvas, MD;   Location: [facility] ENDOSCOPY;  Service: Endoscopy;  Laterality: N/A;  . DE QUERVAIN'S RELEASE Right   . ESOPHAGOGASTRODUODENOSCOPY (EGD) WITH PROPOFOL N/A 05/11/2015   Procedure: ESOPHAGOGASTRODUODENOSCOPY (EGD) WITH PROPOFOL;  Surgeon: Manya Silvas, MD;  Location: [facility] ENDOSCOPY;  Service: Endoscopy;  Laterality: N/A;  . EYE SURGERY     laser surgery bilat   . HERNIA REPAIR    . KNEE ARTHROSCOPY    . LUMBAR LAMINECTOMY/DECOMPRESSION MICRODISCECTOMY Bilateral 09/14/2015   Procedure: MICRO LUMBAR BILATERAL DECOMPRESSION L4 - L5;  Surgeon: Susa Day, MD;  Location: [facility] ORS;  Service: Orthopedics;  Laterality: Bilateral;  . REDUCTION MAMMAPLASTY Bilateral 1980  . RHINOPLASTY    . TONSILLECTOMY    . TOTAL KNEE ARTHROPLASTY Left 06/18/2016   Procedure: LEFT TOTAL KNEE ARTHROPLASTY;  Surgeon: Paralee Cancel, MD;  Location: [facility] ORS;  Service: Orthopedics;  Laterality: Left;  Adductor Block  . TUBAL LIGATION    . UVULOPALATOPHARYNGOPLASTY      There were no vitals filed for this visit.  Subjective Assessment - 06/10/19 1606    Subjective  Not bad today. Has  been trying to walk which affect her R hip. Has been performing tip toes. No R hip pain currently at rest, 8/10 R hip at most for the past 7 days. Bothers her when she moves. 7/10 R hip pain when walking.    Patient is accompained by:  Family member    Pertinent History  Low back pain. Pt states having back surgery about 2-3 years ago. After she had her L TKA, her back started aggravating her due to her walking. Pt also fell about 4 weeks ago onto her L knee. Dr. Alvan Dame checked out her L knee. L knee was fine but has some inflammation.  Pt tries to keep it iced. Pt still recovering for her L TKA but the fall set her back.   Her L knee surgery was last year.  L knee still bothers her a lot.  Had surgery for her back before her L knee surgery. Was doing well until her L knee surgery. The fall made it worse. Feels debilitating. Also has a hard time  getting into and out of the car mainly due to her L knee.   Pt fell due to her L knee bucking on her. Pt was trying to pick something up from the side of the couch.  Also has a hard time getting up from the floor.  Pt states having bladder accidents since after her fall (pt was recommended to tell her MD about it).  Bowel problems since taking medications (was constapated, but after taking medications, pt had diarrhea which was difficult to control. Getting out of the metformin fixed the bowel issue).  Pt states tingling and numbness L lateral LE along the L5 dermatome.  Denies saddle anesthesia.  Pt states that her back problem is mainly on her L side.  Pt landed on her L knee when she fell.  No other falls within the last 6 months. Pt also states being very dizzy since her L knee surgery.  Had PT treatement for her dizziness before which did not help.  The room feels like it is spinning when she gets dizzy.  I feel like it is getting better then she does something.  5/10 low back soreness currently (7/10 at most for the past 7 days after negotiating stairs, 5/10 without doing stairs). 4/10 L knee pain currenglty with gait 5/10 L knee pain at most for the past 7 days.    Patient Stated Goals  Be better able to get into and out of her vehicle (midsized truck), into and out of chairs, be better able to roll in her bed with less L knee pain. Be able to get down on the floor and get back up.     Currently in Pain?  Yes    Pain Score  7    R hip                              PT Education - 06/10/19 1621    Education Details  ther-ex    Person(s) Educated  Patient    Methods  Explanation;Demonstration;Tactile cues;Verbal cues    Comprehension  Returned demonstration;Verbalized understanding      Objectives   MedbridgeAccess Code: [email or web address]  Latex free bands usedif used   Sitting posture: L trunk rotation  Standing posture: slightLlateral shift  Pt states that  pressing her back against the chair feels better.  Gait observation: increased L lateral lean during L LE stance  phase.      Last scheduled appointment: 07/01/19   Therapeutic exercise   Time taken to listen to pt subjective secondary to multiple areas to be treated.   Reviewed progress/current status with pain level with pt.   Seated R hip extension isometrics 10x5 seconds for 3 sets  Reviewed and given as part of her HEP. Pt demonstrated and verbalized understanding. Handout provided.   After Epley maneuver for L  360 degree turns    R 3x. Slight room movement   L 3x minimal to no symptoms.    Improved exercise technique, movement at target joints, use of target muscles after mod verbal, visual, tactile cues.      Canalith repositioning   Epley review for L   Reiterated for pt not to do for the R side so as to not undo the progress if performing at home.  Reinforcement needed.  Epley maneuver for L side. Improved steadiness feeling afterwards reported by pt.   May try the Epley maneuver in clinic to make sure maneuver is performed properly.       Response to treatment Pt tolerated session well without aggravation of symptoms. Decreased R hip pain to 2/10 after session.    Clinical impression Improved R hip pain with treatment to promote R glute max muscle activation to promote more neutral position of her pelvis. Improved sensation of steadiness when performing Epley maneuver for the L side. Pt demonstrates overall improved L knee pain since last progress report. Difficulty with progress secondary to chronicity of multiple treatment areas, and pt difficulty performing proper exercises at home, and performing them correctly. Pt will benefit from continued skilled physical therapy services to maintain function and prevent regression.     PT Short Term Goals - 06/10/19 1849      PT SHORT TERM GOAL #1   Title  Patient will be independent  with her HEP to help decrease back and L knee pain and improve ability to perform functional tasks.     Time  3    Period  Weeks    Status  On-going    Target Date  07/02/19        PT Long Term Goals - 06/10/19 1609      PT LONG TERM GOAL #1   Title  Patient will have a decrease in back pain to 3/10 or less at worst to promote ability to ambulate, turn in bed, perform standing tasks with less pain.     Baseline  6/10 back pain at most (09/24/2017); 7-8/10 at most for the past 7 days, duration of pain is better since starting PT (10/29/2017); 5-6/10 at most for the past 7 days (11/18/2017); 8/10 at most for the past 7 days but the duration of pain is less compared to prior to starting PT (12/12/2017).  7/10 back pain at most for the past 7 days (pt states doing her exercises helps decrease her pain; 01/02/2018); walking (3/10), standing (4/10), bed mobility (0/10) 02/05/2018; 6/10 back pain at most for the past 7 days (03/03/2018); 6.5/10 back pain at most for the past 7 days (03/27/2018); 7-8/10 at most (05/08/2018); 7/10 back pain at worst (07/22/2018);  7-8/10 at most for the past 7 days (09/22/2018); 5-7/10 at worst for the past 7 days then calms down (10/27/2018); 6/10 back pain at most for the past 7 days (when pt cooks and cleans up in kitchen; 12/17/2018); 6-7/10 back spams pain at most for the past 7 days, usually when she  is doing stuff in the kitchen (01/29/2019); 7/10 at worst for the past 7 days (03/25/2019); 7.5/10 (05/20/19); 8/10 low back pain at most for the past 7 days (06/10/2019)    Time  5    Period  Weeks    Status  On-going    Target Date  07/16/19      PT LONG TERM GOAL #2   Title  Patient will have a decrease in L knee pain to 3/10 or less at worst to promote ability to ambulate, negotiate stairs, get into and out of a car more comfortably.     Baseline  6/10 L knee pain at most for the past 3 months (09/24/2017); 7-8/10 at most for the past 7 days, duration of pain is better since  starting PT (10/29/2017); 7/10 at most for the past 7 days (11/18/2017); 6/10 L knee pain at most for the past 7 days (12/12/2017); 5/10 L knee pain at most for the past 7 days (pt states doing her exercises helps decrease pain; 01/02/2018); L knee 3/10 (02/05/2018); 4/10 at most for the past 7 days (03/03/2018); 5.5/10 L knee at most for the past 7 days (03/27/2018); 6-7/10 L knee pain at most (05/08/2018); 7/10 L knee pain at worst for the past month (07/22/2018);  6/10 L knee pain at most for the past 7 days (09/22/2018); 5-6/10 at most for the past 7 days sporadically, can get it better with exercise (10/27/2018); 6/10 L knee ache at most for the past 7 days (12/17/2018); 5-6/10 L knee pain at most for the past 7 days such as going up and down steps (01/29/2019); 5/10 at most for the past 7 days (03/25/2019); 5.5/10 (05/20/19); 4/10 (06/10/2019)    Time  5    Period  Weeks    Status  On-going    Target Date  07/16/19      PT LONG TERM GOAL #3   Title  Patient will improve TUG time to 12 seconds or less as a demonstration of improved functional mobility and balance.     Baseline  TUG no AD: 16.05 seconds on average (09/24/2017); 14.3 seconds on average (10/29/2017); 13 seconds average (11/21/2017); 16.67 seconds average (12/12/2017);  14.17 seconds average (01/02/2018); 11.8 sec sonds (02/05/2018)    Time  6    Period  Weeks    Status  Achieved      PT LONG TERM GOAL #4   Title  Patient will improve her back FOTO score by at least 10 points as a demonstration of improved funtion.      Baseline  Back FOTO: 33 (09/24/2017); 38 (10/29/2017); 36 (11/21/2017); 40 (12/12/2017); 42 (01/02/2018); 47 (03/03/2018)    Time  6    Period  Weeks    Status  Achieved      PT LONG TERM GOAL #5   Title  Patient will improve bilateral LE strength by at least 1/2 MMT grade to promote ability to perform standing tasks.     Time  6    Period  Weeks    Status  Achieved      PT LONG TERM GOAL #6   Title  Patient will improve her  Modified Oswestry Low Back pain disablity questionnaire by at least 10% as a demonstration of improved function.     Baseline  48% (09/24/2017); 46% (10/29/2017), (11/21/2017); 56% (12/12/2017); 36% (01/02/2018), (03/03/2018)    Time  6    Period  Weeks    Status  Achieved  PT LONG TERM GOAL #7   Title  Pt will report decreased difficulty stepping into and out of her bathtub as well as getting into and out of her truck to promote ability to get into and out of places.     Baseline  Pt reports difficulty stepping into and out of her bathtub as well as difficulty getting into and out of her truck (01/02/2018); Pt states the truck and bath tub and difficult and painful.  (02/05/2018); Improving per subjective reports (03/03/2018); decreased difficulty with truck tranfser after cues for technique and practice, decreasing difficulty with stepping into and out of the bath tub based on subjective reports (03/27/2018 and 04/01/2018); Decreasing difficulty stepping into a bathtub. No difficulty getting into and out of her truck (05/08/2018), (07/22/2018); Difficulty stepping into and out of the bath tub. No difficulty with truck transfers (09/22/2018); Able to get into her bath tub, difficulty getting out (10/27/18); Still has difficulty getting into and out of bathtub (02/23/2019); Better able to step into the bathtub. Still has difficulty stepping out of the bathtub due to knee flexion (03/25/2019); No difficulty stepping into her bath tub. Still has difficulty stepping out due to decreased L knee flexion ROM (05/20/2019), (06/10/2019)    Time  5    Period  Weeks    Status  Partially Met    Target Date  07/16/19      PT LONG TERM GOAL #8   Title  Patient will improve her BERG balance test score to 46/56 or more as a demonstration of decreased fall risk     Baseline  42/56 (01/02/2018); 54/56 (02/05/2018)    Time  6    Period  Weeks    Status  Achieved      PT LONG TERM GOAL  #9   TITLE  Pt will report minimal to no  dizziness related to vertigo to promote balance and decrease fall risk.     Baseline  Pt reports dizziness (related to vertigo) which seem to affect her balance (01/15/2018); Patient reports "a little bit every now and then" (02/05/2018); Pt reports dizziness and decreased balance (01/29/2019); Dizziness has improved some (02/23/2019), (03/25/2019); Still has dizziness and vertigo; does the Epley for both sides instead of just the L side (06/10/2019)    Time  5    Period  Weeks    Status  On-going    Target Date  07/16/19      PT LONG TERM GOAL  #10   TITLE  Pt will be able to turn 360 degrees to the R and L at least 3 times without dizziness and no LOB to promote safety and decrease fall risk.    Baseline  360 degree turn to the R and L causes a loss of balance (12/17/2018), (01/29/2019). able to perform 3 times with each side with improved balance, minimal dizziness. (02/23/2019); Dizziness (05/20/19), slight dizziness (06/10/2019)    Time  5    Period  Weeks    Status  On-going    Target Date  07/16/19      PT LONG TERM GOAL  #11   TITLE  Patient will have a decrease in R hip and knee pain to 4/10 or less at worst to promote ability to ambulate with less discomfort.    Baseline  7/10 R hip and knee pain at worst for the past month (03/25/2019); 5/10 R hip pain, 6/10 R knee pain at most for the past 7 days (05/20/19); 5.5/10 R knee  pain (05/20/19); 8/10 R hip; 6/10 R knee at most for the past 7 days (06/10/2019)    Time  5    Period  Weeks    Status  Partially Met    Target Date  07/16/19            Plan - 06/10/19 1622    Clinical Impression Statement  Improved R hip pain with treatment to promote R glute max muscle activation to promote more neutral position of her pelvis. Improved sensation of steadiness when performing Epley maneuver for the L side. Pt demonstrates overall improved L knee pain since last progress report. Difficulty with progress secondary to chronicity of multiple  treatment areas, and pt difficulty performing proper exercises at home, and performing them correctly. Pt will benefit from continued skilled physical therapy services to maintain function and prevent regression.    Personal Factors and Comorbidities  Age;Comorbidity 3+;Time since onset of injury/illness/exacerbation;Fitness;Past/Current Experience    Comorbidities  Back, knee pain, hx of dizziness, hx of back surgery, hx of C-section    Examination-Activity Limitations  Squat    Stability/Clinical Decision Making  Evolving/Moderate complexity    Clinical Decision Making  Moderate    Clinical Presentation due to:  multiple areas being treated, improves at times, gets worse at times. Difficulty with pt consistency    Rehab Potential  Fair    Clinical Impairments Affecting Rehab Potential  difficulty with carry-over of improvements in clinic, chronicity of condition, age, multiple problem areas    PT Frequency  1x / week    PT Duration  Other (comment)   5 weeks   PT Treatment/Interventions  Therapeutic activities;Therapeutic exercise;Balance training;Neuromuscular re-education;Patient/family education;Manual techniques;Dry needling;Aquatic Therapy;Electrical Stimulation;Iontophoresis 37m/ml Dexamethasone;Gait training;Canalith Repostioning;Vestibular    PT Next Visit Plan   hip and knee strengthening, core strengthening, patellar mobility, manual techniques, modalities PRN    Consulted and Agree with Plan of Care  Patient       Patient will benefit from skilled therapeutic intervention in order to improve the following deficits and impairments:  Pain, Improper body mechanics, Postural dysfunction, Dizziness, Decreased strength, Difficulty walking, Decreased balance  Visit Diagnosis: Muscle weakness (generalized)  History of falling  Radiculopathy, lumbar region  Pain in right hip  Dizziness and giddiness  Right knee pain, unspecified chronicity  Chronic pain of left  knee     Problem List Patient Active Problem List   Diagnosis Date Noted  . Obese 06/20/2016  . Status post right knee replacement 06/19/2016  . S/P left TKA 06/18/2016  . Spinal stenosis of lumbar region 09/14/2015    MJoneen BoersPT, DPT  06/10/2019, 6:56 PM  [facility] [address] [place] [address]Phone: [phone]  Fax:  [phone] Name: Destiny EHINGERMRN: [id number] of Birth: 407/30/1952

## 2019-06-17 ENCOUNTER — Other Ambulatory Visit: Payer: Self-pay

## 2019-06-17 ENCOUNTER — Ambulatory Visit: Payer: Medicare Other

## 2019-06-17 DIAGNOSIS — M25561 Pain in right knee: Secondary | ICD-10-CM

## 2019-06-17 DIAGNOSIS — M6281 Muscle weakness (generalized): Secondary | ICD-10-CM

## 2019-06-17 NOTE — Therapy (Signed)
[facility] [address], [place], [address] Phone: [phone]   Fax:  [phone]  Physical Therapy Treatment  Patient Details  Name: Destiny Shaw MRN: [id number] Date of Birth: 28-Aug-1950 Referring Provider (PT): Paralee Cancel, MD   Encounter Date: 06/17/2019  PT End of Session - 06/17/19 1518    Visit Number  82    Number of Visits  102    Date for PT Re-Evaluation  07/16/19    Authorization Type  1    Authorization Time Period  of 10 progress note Medicare    PT Start Time  1518    PT Stop Time  1559    PT Time Calculation (min)  41 min    Activity Tolerance  Patient tolerated treatment well    Behavior During Therapy  [facility] for tasks assessed/performed       Past Medical History:  Diagnosis Date  . ADD (attention deficit disorder)   . Anemia   . Anginal pain ([place])    pt states has occas chest pain relates to indigestion; pt uses rest to relieve   . Anxiety   . Arthritis   . Concussion   . Depression   . Diabetes mellitus without complication ([place])   . Dizziness   . Fall   . GERD (gastroesophageal reflux disease)   . Headache   . History of urinary tract infection   . Hyperlipidemia   . Hypertension   . Hypothyroidism   . IBS (irritable bowel syndrome)   . Imbalance   . Numbness    right leg   . Numbness in both hands    comes and goes   . Pneumonia    last episode approx 1 year ago   . Sleep apnea   . Wears glasses     Past Surgical History:  Procedure Laterality Date  . BREAST CYST ASPIRATION Left 1980's   neg  . BREAST CYST EXCISION Right 1980's   neg  . BREAST LUMPECTOMY Right   . BREAST SURGERY    . CARPAL TUNNEL RELEASE    . CESAREAN SECTION     times 2  . COLONOSCOPY WITH PROPOFOL N/A 05/11/2015   Procedure: COLONOSCOPY WITH PROPOFOL;  Surgeon: Manya Silvas, MD;  Location: [facility] ENDOSCOPY;  Service: Endoscopy;  Laterality: N/A;  . DE QUERVAIN'S RELEASE Right   .  ESOPHAGOGASTRODUODENOSCOPY (EGD) WITH PROPOFOL N/A 05/11/2015   Procedure: ESOPHAGOGASTRODUODENOSCOPY (EGD) WITH PROPOFOL;  Surgeon: Manya Silvas, MD;  Location: [facility] ENDOSCOPY;  Service: Endoscopy;  Laterality: N/A;  . EYE SURGERY     laser surgery bilat   . HERNIA REPAIR    . KNEE ARTHROSCOPY    . LUMBAR LAMINECTOMY/DECOMPRESSION MICRODISCECTOMY Bilateral 09/14/2015   Procedure: MICRO LUMBAR BILATERAL DECOMPRESSION L4 - L5;  Surgeon: Susa Day, MD;  Location: [facility] ORS;  Service: Orthopedics;  Laterality: Bilateral;  . REDUCTION MAMMAPLASTY Bilateral 1980  . RHINOPLASTY    . TONSILLECTOMY    . TOTAL KNEE ARTHROPLASTY Left 06/18/2016   Procedure: LEFT TOTAL KNEE ARTHROPLASTY;  Surgeon: Paralee Cancel, MD;  Location: [facility] ORS;  Service: Orthopedics;  Laterality: Left;  Adductor Block  . TUBAL LIGATION    . UVULOPALATOPHARYNGOPLASTY      There were no vitals filed for this visit.  Subjective Assessment - 06/17/19 1519    Subjective  R knee pain (5/10 currently). L knee is ok. R hip is pretty good. Has been limping pretty bad most  of the day, took a hot bath which helped.  Back is not bad right now. Dizziness is better. Did the Epley at home for the L side only which helps.    Patient is accompained by:  Family member    Pertinent History  Low back pain. Pt states having back surgery about 2-3 years ago. After she had her L TKA, her back started aggravating her due to her walking. Pt also fell about 4 weeks ago onto her L knee. Dr. Alvan Dame checked out her L knee. L knee was fine but has some inflammation.  Pt tries to keep it iced. Pt still recovering for her L TKA but the fall set her back.   Her L knee surgery was last year.  L knee still bothers her a lot.  Had surgery for her back before her L knee surgery. Was doing well until her L knee surgery. The fall made it worse. Feels debilitating. Also has a hard time getting into and out of the car mainly due to her L knee.   Pt fell due to her L knee  bucking on her. Pt was trying to pick something up from the side of the couch.  Also has a hard time getting up from the floor.  Pt states having bladder accidents since after her fall (pt was recommended to tell her MD about it).  Bowel problems since taking medications (was constapated, but after taking medications, pt had diarrhea which was difficult to control. Getting out of the metformin fixed the bowel issue).  Pt states tingling and numbness L lateral LE along the L5 dermatome.  Denies saddle anesthesia.  Pt states that her back problem is mainly on her L side.  Pt landed on her L knee when she fell.  No other falls within the last 6 months. Pt also states being very dizzy since her L knee surgery.  Had PT treatement for her dizziness before which did not help.  The room feels like it is spinning when she gets dizzy.  I feel like it is getting better then she does something.  5/10 low back soreness currently (7/10 at most for the past 7 days after negotiating stairs, 5/10 without doing stairs). 4/10 L knee pain currenglty with gait 5/10 L knee pain at most for the past 7 days.    Patient Stated Goals  Be better able to get into and out of her vehicle (midsized truck), into and out of chairs, be better able to roll in her bed with less L knee pain. Be able to get down on the floor and get back up.     Currently in Pain?  Yes    Pain Score  5     Pain Location  Knee    Pain Orientation  Right                               PT Education - 06/17/19 1524    Education Details  ther-ex    Person(s) Educated  Patient    Methods  Explanation;Tactile cues;Demonstration;Verbal cues    Comprehension  Returned demonstration;Verbalized understanding      Objectives   MedbridgeAccess Code: [email or web address]  Latex free bands usedif used   Sitting posture: L trunk rotation  Standing posture: slightLlateral shift  Pt states that pressing her back against the chair  feels better.  Gait observation: increased L lateral lean during L LE stance  phase.      Therapeutic exercise   Seated hip adduction ball and glute max squeeze 10x10 seconds   Decreased R medial knee pain to 4/10 afterwards.   Seated R medial hamstrings flexion red band 10x3  Seated manually resisted R hip extension 10x3   Seated R hip IR AAROM to end range with PT 10x3 with 5 second holds   Decreased R medial knee pain to 2/10 with gait afterwards  Seated manually resisted clamshell isometrics, hips less than 90 degrees flexion 10x3 with 5 seconds    Improved exercise technique, movement at target joints, use of target muscles after mod verbal, visual, tactile cues.      Manual therapy  Seated STM R medial knee to decrease soft tissue/fascial restrictions      Response to treatment Pt tolerated session well without aggravation of symptoms.    Clinical impression Decreased R medal knee pain with treatment to promote R hip IR, hip extension and abduction strength. Decreased R medial knee pain reported by pt after session. Pt will benefit from continued skilled physical therapy services to decrease pain, maintain progress/prevent regression of progress, improve strength and function.      PT Short Term Goals - 06/10/19 1849      PT SHORT TERM GOAL #1   Title  Patient will be independent with her HEP to help decrease back and L knee pain and improve ability to perform functional tasks.     Time  3    Period  Weeks    Status  On-going    Target Date  07/02/19        PT Long Term Goals - 06/10/19 1609      PT LONG TERM GOAL #1   Title  Patient will have a decrease in back pain to 3/10 or less at worst to promote ability to ambulate, turn in bed, perform standing tasks with less pain.     Baseline  6/10 back pain at most (09/24/2017); 7-8/10 at most for the past 7 days, duration of pain is better since starting PT (10/29/2017); 5-6/10 at  most for the past 7 days (11/18/2017); 8/10 at most for the past 7 days but the duration of pain is less compared to prior to starting PT (12/12/2017).  7/10 back pain at most for the past 7 days (pt states doing her exercises helps decrease her pain; 01/02/2018); walking (3/10), standing (4/10), bed mobility (0/10) 02/05/2018; 6/10 back pain at most for the past 7 days (03/03/2018); 6.5/10 back pain at most for the past 7 days (03/27/2018); 7-8/10 at most (05/08/2018); 7/10 back pain at worst (07/22/2018);  7-8/10 at most for the past 7 days (09/22/2018); 5-7/10 at worst for the past 7 days then calms down (10/27/2018); 6/10 back pain at most for the past 7 days (when pt cooks and cleans up in kitchen; 12/17/2018); 6-7/10 back spams pain at most for the past 7 days, usually when she is doing stuff in the kitchen (01/29/2019); 7/10 at worst for the past 7 days (03/25/2019); 7.5/10 (05/20/19); 8/10 low back pain at most for the past 7 days (06/10/2019)    Time  5    Period  Weeks    Status  On-going    Target Date  07/16/19      PT LONG TERM GOAL #2   Title  Patient will have a decrease in L knee pain to 3/10 or less at worst to promote ability to ambulate, negotiate stairs, get into  and out of a car more comfortably.     Baseline  6/10 L knee pain at most for the past 3 months (09/24/2017); 7-8/10 at most for the past 7 days, duration of pain is better since starting PT (10/29/2017); 7/10 at most for the past 7 days (11/18/2017); 6/10 L knee pain at most for the past 7 days (12/12/2017); 5/10 L knee pain at most for the past 7 days (pt states doing her exercises helps decrease pain; 01/02/2018); L knee 3/10 (02/05/2018); 4/10 at most for the past 7 days (03/03/2018); 5.5/10 L knee at most for the past 7 days (03/27/2018); 6-7/10 L knee pain at most (05/08/2018); 7/10 L knee pain at worst for the past month (07/22/2018);  6/10 L knee pain at most for the past 7 days (09/22/2018); 5-6/10 at most for the past 7 days sporadically, can  get it better with exercise (10/27/2018); 6/10 L knee ache at most for the past 7 days (12/17/2018); 5-6/10 L knee pain at most for the past 7 days such as going up and down steps (01/29/2019); 5/10 at most for the past 7 days (03/25/2019); 5.5/10 (05/20/19); 4/10 (06/10/2019)    Time  5    Period  Weeks    Status  On-going    Target Date  07/16/19      PT LONG TERM GOAL #3   Title  Patient will improve TUG time to 12 seconds or less as a demonstration of improved functional mobility and balance.     Baseline  TUG no AD: 16.05 seconds on average (09/24/2017); 14.3 seconds on average (10/29/2017); 13 seconds average (11/21/2017); 16.67 seconds average (12/12/2017);  14.17 seconds average (01/02/2018); 11.8 sec sonds (02/05/2018)    Time  6    Period  Weeks    Status  Achieved      PT LONG TERM GOAL #4   Title  Patient will improve her back FOTO score by at least 10 points as a demonstration of improved funtion.      Baseline  Back FOTO: 33 (09/24/2017); 38 (10/29/2017); 36 (11/21/2017); 40 (12/12/2017); 42 (01/02/2018); 47 (03/03/2018)    Time  6    Period  Weeks    Status  Achieved      PT LONG TERM GOAL #5   Title  Patient will improve bilateral LE strength by at least 1/2 MMT grade to promote ability to perform standing tasks.     Time  6    Period  Weeks    Status  Achieved      PT LONG TERM GOAL #6   Title  Patient will improve her Modified Oswestry Low Back pain disablity questionnaire by at least 10% as a demonstration of improved function.     Baseline  48% (09/24/2017); 46% (10/29/2017), (11/21/2017); 56% (12/12/2017); 36% (01/02/2018), (03/03/2018)    Time  6    Period  Weeks    Status  Achieved      PT LONG TERM GOAL #7   Title  Pt will report decreased difficulty stepping into and out of her bathtub as well as getting into and out of her truck to promote ability to get into and out of places.     Baseline  Pt reports difficulty stepping into and out of her bathtub as well as difficulty getting  into and out of her truck (01/02/2018); Pt states the truck and bath tub and difficult and painful.  (02/05/2018); Improving per subjective reports (03/03/2018); decreased difficulty with truck  tranfser after cues for technique and practice, decreasing difficulty with stepping into and out of the bath tub based on subjective reports (03/27/2018 and 04/01/2018); Decreasing difficulty stepping into a bathtub. No difficulty getting into and out of her truck (05/08/2018), (07/22/2018); Difficulty stepping into and out of the bath tub. No difficulty with truck transfers (09/22/2018); Able to get into her bath tub, difficulty getting out (10/27/18); Still has difficulty getting into and out of bathtub (02/23/2019); Better able to step into the bathtub. Still has difficulty stepping out of the bathtub due to knee flexion (03/25/2019); No difficulty stepping into her bath tub. Still has difficulty stepping out due to decreased L knee flexion ROM (05/20/2019), (06/10/2019)    Time  5    Period  Weeks    Status  Partially Met    Target Date  07/16/19      PT LONG TERM GOAL #8   Title  Patient will improve her BERG balance test score to 46/56 or more as a demonstration of decreased fall risk     Baseline  42/56 (01/02/2018); 54/56 (02/05/2018)    Time  6    Period  Weeks    Status  Achieved      PT LONG TERM GOAL  #9   TITLE  Pt will report minimal to no dizziness related to vertigo to promote balance and decrease fall risk.     Baseline  Pt reports dizziness (related to vertigo) which seem to affect her balance (01/15/2018); Patient reports "a little bit every now and then" (02/05/2018); Pt reports dizziness and decreased balance (01/29/2019); Dizziness has improved some (02/23/2019), (03/25/2019); Still has dizziness and vertigo; does the Epley for both sides instead of just the L side (06/10/2019)    Time  5    Period  Weeks    Status  On-going    Target Date  07/16/19      PT LONG TERM GOAL  #10   TITLE  Pt will be  able to turn 360 degrees to the R and L at least 3 times without dizziness and no LOB to promote safety and decrease fall risk.    Baseline  360 degree turn to the R and L causes a loss of balance (12/17/2018), (01/29/2019). able to perform 3 times with each side with improved balance, minimal dizziness. (02/23/2019); Dizziness (05/20/19), slight dizziness (06/10/2019)    Time  5    Period  Weeks    Status  On-going    Target Date  07/16/19      PT LONG TERM GOAL  #11   TITLE  Patient will have a decrease in R hip and knee pain to 4/10 or less at worst to promote ability to ambulate with less discomfort.    Baseline  7/10 R hip and knee pain at worst for the past month (03/25/2019); 5/10 R hip pain, 6/10 R knee pain at most for the past 7 days (05/20/19); 5.5/10 R knee pain (05/20/19); 8/10 R hip; 6/10 R knee at most for the past 7 days (06/10/2019)    Time  5    Period  Weeks    Status  Partially Met    Target Date  07/16/19            Plan - 06/17/19 1526    Clinical Impression Statement  Decreased R medal knee pain with treatment to promote R hip IR, hip extension and abduction strength. Decreased R medial knee pain reported by pt after session.  Pt will benefit from continued skilled physical therapy services to decrease pain, maintain progress/prevent regression of progress, improve strength and function.    Personal Factors and Comorbidities  Age;Comorbidity 3+;Time since onset of injury/illness/exacerbation;Fitness;Past/Current Experience    Comorbidities  Back, knee pain, hx of dizziness, hx of back surgery, hx of C-section    Examination-Activity Limitations  Squat    Stability/Clinical Decision Making  Evolving/Moderate complexity    Rehab Potential  Fair    Clinical Impairments Affecting Rehab Potential  difficulty with carry-over of improvements in clinic, chronicity of condition, age, multiple problem areas    PT Frequency  1x / week    PT Duration  Other (comment)   5 weeks    PT Treatment/Interventions  Therapeutic activities;Therapeutic exercise;Balance training;Neuromuscular re-education;Patient/family education;Manual techniques;Dry needling;Aquatic Therapy;Electrical Stimulation;Iontophoresis 46m/ml Dexamethasone;Gait training;Canalith Repostioning;Vestibular    PT Next Visit Plan   hip and knee strengthening, core strengthening, patellar mobility, manual techniques, modalities PRN    Consulted and Agree with Plan of Care  Patient       Patient will benefit from skilled therapeutic intervention in order to improve the following deficits and impairments:  Pain, Improper body mechanics, Postural dysfunction, Dizziness, Decreased strength, Difficulty walking, Decreased balance  Visit Diagnosis: Muscle weakness (generalized)  Right knee pain, unspecified chronicity     Problem List Patient Active Problem List   Diagnosis Date Noted  . Obese 06/20/2016  . Status post right knee replacement 06/19/2016  . S/P left TKA 06/18/2016  . Spinal stenosis of lumbar region 09/14/2015    MJoneen BoersPT, DPT   06/17/2019, 6:58 PM  [facility] [address] [place] [id number]: [phone]  Fax:  [phone] Name: Destiny Shaw: [id number] of Birth: 407/31/1952

## 2019-06-24 ENCOUNTER — Ambulatory Visit: Payer: Medicare Other

## 2019-07-01 ENCOUNTER — Other Ambulatory Visit: Payer: Self-pay

## 2019-07-01 ENCOUNTER — Ambulatory Visit: Payer: Medicare Other | Attending: Orthopedic Surgery

## 2019-07-01 DIAGNOSIS — R42 Dizziness and giddiness: Secondary | ICD-10-CM | POA: Diagnosis present

## 2019-07-01 DIAGNOSIS — Z9181 History of falling: Secondary | ICD-10-CM

## 2019-07-01 DIAGNOSIS — M25551 Pain in right hip: Secondary | ICD-10-CM

## 2019-07-01 DIAGNOSIS — M6281 Muscle weakness (generalized): Secondary | ICD-10-CM

## 2019-07-01 DIAGNOSIS — M25562 Pain in left knee: Secondary | ICD-10-CM | POA: Insufficient documentation

## 2019-07-01 DIAGNOSIS — M5416 Radiculopathy, lumbar region: Secondary | ICD-10-CM

## 2019-07-01 DIAGNOSIS — G8929 Other chronic pain: Secondary | ICD-10-CM | POA: Insufficient documentation

## 2019-07-01 DIAGNOSIS — M25561 Pain in right knee: Secondary | ICD-10-CM

## 2019-07-01 NOTE — Therapy (Signed)
[facility] [address], [place], [address] Phone: [phone]   Fax:  [phone]  Physical Therapy Treatment  Patient Details  Name: Destiny Shaw MRN: [id number] Date of Birth: Nov 07, 1950 Referring Provider (PT): Paralee Cancel, MD   Encounter Date: 07/01/2019  PT End of Session - 07/01/19 1302    Visit Number  72    Number of Visits  102    Date for PT Re-Evaluation  07/16/19    Authorization Type  2    Authorization Time Period  of 10 progress note Medicare    PT Start Time  1302    PT Stop Time  1343    PT Time Calculation (min)  41 min    Activity Tolerance  Patient tolerated treatment well    Behavior During Therapy  [facility] for tasks assessed/performed       Past Medical History:  Diagnosis Date  . ADD (attention deficit disorder)   . Anemia   . Anginal pain ([place])    pt states has occas chest pain relates to indigestion; pt uses rest to relieve   . Anxiety   . Arthritis   . Concussion   . Depression   . Diabetes mellitus without complication ([place])   . Dizziness   . Fall   . GERD (gastroesophageal reflux disease)   . Headache   . History of urinary tract infection   . Hyperlipidemia   . Hypertension   . Hypothyroidism   . IBS (irritable bowel syndrome)   . Imbalance   . Numbness    right leg   . Numbness in both hands    comes and goes   . Pneumonia    last episode approx 1 year ago   . Sleep apnea   . Wears glasses     Past Surgical History:  Procedure Laterality Date  . BREAST CYST ASPIRATION Left 1980's   neg  . BREAST CYST EXCISION Right 1980's   neg  . BREAST LUMPECTOMY Right   . BREAST SURGERY    . CARPAL TUNNEL RELEASE    . CESAREAN SECTION     times 2  . COLONOSCOPY WITH PROPOFOL N/A 05/11/2015   Procedure: COLONOSCOPY WITH PROPOFOL;  Surgeon: Manya Silvas, MD;  Location[facility] ENDOSCOPY;  Service: Endoscopy;  Laterality: N/A;  . DE QUERVAIN'S RELEASE Right   .  ESOPHAGOGASTRODUODENOSCOPY (EGD) WITH PROPOFOL N/A 05/11/2015   Procedure: ESOPHAGOGASTRODUODENOSCOPY (EGD) WITH PROPOFOL;  Surgeon: Manya Silvas, MD;  Location: [facility] ENDOSCOPY;  Service: Endoscopy;  Laterality: N/A;  . EYE SURGERY     laser surgery bilat   . HERNIA REPAIR    . KNEE ARTHROSCOPY    . LUMBAR LAMINECTOMY/DECOMPRESSION MICRODISCECTOMY Bilateral 09/14/2015   Procedure: MICRO LUMBAR BILATERAL DECOMPRESSION L4 - L5;  Surgeon: Susa Day, MD;  Location: [facility] ORS;  Service: Orthopedics;  Laterality: Bilateral;  . REDUCTION MAMMAPLASTY Bilateral 1980  . RHINOPLASTY    . TONSILLECTOMY    . TOTAL KNEE ARTHROPLASTY Left 06/18/2016   Procedure: LEFT TOTAL KNEE ARTHROPLASTY;  Surgeon: Paralee Cancel, MD;  Location: [facility] ORS;  Service: Orthopedics;  Laterality: Left;  Adductor Block  . TUBAL LIGATION    . UVULOPALATOPHARYNGOPLASTY      There were no vitals filed for this visit.  Subjective Assessment - 07/01/19 1304    Subjective  R knee is sore medially (6.5/10 R knee pain currently when walking). R low back bothers her (5/10 currently, pt  sitting). R knee is just a little sore. The R side bothers her more.  Feels like she does the movements correctly in PT but not outside of PT.    Patient is accompained by:  Family member    Pertinent History  Low back pain. Pt states having back surgery about 2-3 years ago. After she had her L TKA, her back started aggravating her due to her walking. Pt also fell about 4 weeks ago onto her L knee. Dr. Alvan Dame checked out her L knee. L knee was fine but has some inflammation.  Pt tries to keep it iced. Pt still recovering for her L TKA but the fall set her back.   Her L knee surgery was last year.  L knee still bothers her a lot.  Had surgery for her back before her L knee surgery. Was doing well until her L knee surgery. The fall made it worse. Feels debilitating. Also has a hard time getting into and out of the car mainly due to her L knee.   Pt fell due to  her L knee bucking on her. Pt was trying to pick something up from the side of the couch.  Also has a hard time getting up from the floor.  Pt states having bladder accidents since after her fall (pt was recommended to tell her MD about it).  Bowel problems since taking medications (was constapated, but after taking medications, pt had diarrhea which was difficult to control. Getting out of the metformin fixed the bowel issue).  Pt states tingling and numbness L lateral LE along the L5 dermatome.  Denies saddle anesthesia.  Pt states that her back problem is mainly on her L side.  Pt landed on her L knee when she fell.  No other falls within the last 6 months. Pt also states being very dizzy since her L knee surgery.  Had PT treatement for her dizziness before which did not help.  The room feels like it is spinning when she gets dizzy.  I feel like it is getting better then she does something.  5/10 low back soreness currently (7/10 at most for the past 7 days after negotiating stairs, 5/10 without doing stairs). 4/10 L knee pain currenglty with gait 5/10 L knee pain at most for the past 7 days.    Patient Stated Goals  Be better able to get into and out of her vehicle (midsized truck), into and out of chairs, be better able to roll in her bed with less L knee pain. Be able to get down on the floor and get back up.     Currently in Pain?  Yes    Pain Score  7     Pain Location  Knee    Pain Orientation  Right                               PT Education - 07/01/19 1309    Education Details  ther-ex    Person(s) Educated  Patient    Methods  Explanation;Demonstration;Tactile cues;Verbal cues    Comprehension  Returned demonstration;Verbalized understanding      Objectives   MedbridgeAccess Code: [email or web address]  Latex free bands usedif used   Sitting posture: L trunk rotation  Standing posture: slightLlateral shift  Pt states that pressing her back against the  chair feels better.  Gait observation: increased L lateral lean during L LE stance phase.  Therapeutic exercise  Seated R hip IR 10x3 with 5 second holds   Slight decrease in R medial knee pain with gait  Supine R hip IR stretch with PT 3 minutes   Supine with hip in 90/90  R hip IR 10x3  S/L hip abduction   R 10x2, then 5x  L 10x2  Seated R hip extension isometrics 10x5 seconds for 3 sets   Seated manual R lateral shift correction, with L PT hand on L L2 area and R hand at R pt shoulder area.  Decreased R medial thigh soreness   Seated manually resisted L lateral shift to correct R lateral shift posture. 10x3 wiith 5 second holds     Improved exercise technique, movement at target joints, use of target muscles after mod verbal, visual, tactile cues.      Response to treatment Pt tolerated session well without aggravation of symptoms.Pt states that she feels better after session.    Clinical impression Decreased R LE pain with treatment to promote R hip IR ROM, R glute max, trunk strength and improve posture. Based on pt subjective reports, pt able to perform movements correctly in the clinic and does not hurt as much but unsure if she is performing movements properly when out of the clinic.  Pt will benefit from continued skilled physical therapy services to decrease pain, improve strength and function.       PT Short Term Goals - 06/10/19 1849      PT SHORT TERM GOAL #1   Title  Patient will be independent with her HEP to help decrease back and L knee pain and improve ability to perform functional tasks.     Time  3    Period  Weeks    Status  On-going    Target Date  07/02/19        PT Long Term Goals - 06/10/19 1609      PT LONG TERM GOAL #1   Title  Patient will have a decrease in back pain to 3/10 or less at worst to promote ability to ambulate, turn in bed, perform standing tasks with less pain.     Baseline  6/10  back pain at most (09/24/2017); 7-8/10 at most for the past 7 days, duration of pain is better since starting PT (10/29/2017); 5-6/10 at most for the past 7 days (11/18/2017); 8/10 at most for the past 7 days but the duration of pain is less compared to prior to starting PT (12/12/2017).  7/10 back pain at most for the past 7 days (pt states doing her exercises helps decrease her pain; 01/02/2018); walking (3/10), standing (4/10), bed mobility (0/10) 02/05/2018; 6/10 back pain at most for the past 7 days (03/03/2018); 6.5/10 back pain at most for the past 7 days (03/27/2018); 7-8/10 at most (05/08/2018); 7/10 back pain at worst (07/22/2018);  7-8/10 at most for the past 7 days (09/22/2018); 5-7/10 at worst for the past 7 days then calms down (10/27/2018); 6/10 back pain at most for the past 7 days (when pt cooks and cleans up in kitchen; 12/17/2018); 6-7/10 back spams pain at most for the past 7 days, usually when she is doing stuff in the kitchen (01/29/2019); 7/10 at worst for the past 7 days (03/25/2019); 7.5/10 (05/20/19); 8/10 low back pain at most for the past 7 days (06/10/2019)    Time  5    Period  Weeks    Status  On-going    Target Date  07/16/19  PT LONG TERM GOAL #2   Title  Patient will have a decrease in L knee pain to 3/10 or less at worst to promote ability to ambulate, negotiate stairs, get into and out of a car more comfortably.     Baseline  6/10 L knee pain at most for the past 3 months (09/24/2017); 7-8/10 at most for the past 7 days, duration of pain is better since starting PT (10/29/2017); 7/10 at most for the past 7 days (11/18/2017); 6/10 L knee pain at most for the past 7 days (12/12/2017); 5/10 L knee pain at most for the past 7 days (pt states doing her exercises helps decrease pain; 01/02/2018); L knee 3/10 (02/05/2018); 4/10 at most for the past 7 days (03/03/2018); 5.5/10 L knee at most for the past 7 days (03/27/2018); 6-7/10 L knee pain at most (05/08/2018); 7/10 L knee pain at worst for the  past month (07/22/2018);  6/10 L knee pain at most for the past 7 days (09/22/2018); 5-6/10 at most for the past 7 days sporadically, can get it better with exercise (10/27/2018); 6/10 L knee ache at most for the past 7 days (12/17/2018); 5-6/10 L knee pain at most for the past 7 days such as going up and down steps (01/29/2019); 5/10 at most for the past 7 days (03/25/2019); 5.5/10 (05/20/19); 4/10 (06/10/2019)    Time  5    Period  Weeks    Status  On-going    Target Date  07/16/19      PT LONG TERM GOAL #3   Title  Patient will improve TUG time to 12 seconds or less as a demonstration of improved functional mobility and balance.     Baseline  TUG no AD: 16.05 seconds on average (09/24/2017); 14.3 seconds on average (10/29/2017); 13 seconds average (11/21/2017); 16.67 seconds average (12/12/2017);  14.17 seconds average (01/02/2018); 11.8 sec sonds (02/05/2018)    Time  6    Period  Weeks    Status  Achieved      PT LONG TERM GOAL #4   Title  Patient will improve her back FOTO score by at least 10 points as a demonstration of improved funtion.      Baseline  Back FOTO: 33 (09/24/2017); 38 (10/29/2017); 36 (11/21/2017); 40 (12/12/2017); 42 (01/02/2018); 47 (03/03/2018)    Time  6    Period  Weeks    Status  Achieved      PT LONG TERM GOAL #5   Title  Patient will improve bilateral LE strength by at least 1/2 MMT grade to promote ability to perform standing tasks.     Time  6    Period  Weeks    Status  Achieved      PT LONG TERM GOAL #6   Title  Patient will improve her Modified Oswestry Low Back pain disablity questionnaire by at least 10% as a demonstration of improved function.     Baseline  48% (09/24/2017); 46% (10/29/2017), (11/21/2017); 56% (12/12/2017); 36% (01/02/2018), (03/03/2018)    Time  6    Period  Weeks    Status  Achieved      PT LONG TERM GOAL #7   Title  Pt will report decreased difficulty stepping into and out of her bathtub as well as getting into and out of her truck to promote ability  to get into and out of places.     Baseline  Pt reports difficulty stepping into and out of her bathtub as  well as difficulty getting into and out of her truck (01/02/2018); Pt states the truck and bath tub and difficult and painful.  (02/05/2018); Improving per subjective reports (03/03/2018); decreased difficulty with truck tranfser after cues for technique and practice, decreasing difficulty with stepping into and out of the bath tub based on subjective reports (03/27/2018 and 04/01/2018); Decreasing difficulty stepping into a bathtub. No difficulty getting into and out of her truck (05/08/2018), (07/22/2018); Difficulty stepping into and out of the bath tub. No difficulty with truck transfers (09/22/2018); Able to get into her bath tub, difficulty getting out (10/27/18); Still has difficulty getting into and out of bathtub (02/23/2019); Better able to step into the bathtub. Still has difficulty stepping out of the bathtub due to knee flexion (03/25/2019); No difficulty stepping into her bath tub. Still has difficulty stepping out due to decreased L knee flexion ROM (05/20/2019), (06/10/2019)    Time  5    Period  Weeks    Status  Partially Met    Target Date  07/16/19      PT LONG TERM GOAL #8   Title  Patient will improve her BERG balance test score to 46/56 or more as a demonstration of decreased fall risk     Baseline  42/56 (01/02/2018); 54/56 (02/05/2018)    Time  6    Period  Weeks    Status  Achieved      PT LONG TERM GOAL  #9   TITLE  Pt will report minimal to no dizziness related to vertigo to promote balance and decrease fall risk.     Baseline  Pt reports dizziness (related to vertigo) which seem to affect her balance (01/15/2018); Patient reports "a little bit every now and then" (02/05/2018); Pt reports dizziness and decreased balance (01/29/2019); Dizziness has improved some (02/23/2019), (03/25/2019); Still has dizziness and vertigo; does the [place] for both sides instead of just the L side  (06/10/2019)    Time  5    Period  Weeks    Status  On-going    Target Date  07/16/19      PT LONG TERM GOAL  #10   TITLE  Pt will be able to turn 360 degrees to the R and L at least 3 times without dizziness and no LOB to promote safety and decrease fall risk.    Baseline  360 degree turn to the R and L causes a loss of balance (12/17/2018), (01/29/2019). able to perform 3 times with each side with improved balance, minimal dizziness. (02/23/2019); Dizziness (05/20/19), slight dizziness (06/10/2019)    Time  5    Period  Weeks    Status  On-going    Target Date  07/16/19      PT LONG TERM GOAL  #11   TITLE  Patient will have a decrease in R hip and knee pain to 4/10 or less at worst to promote ability to ambulate with less discomfort.    Baseline  7/10 R hip and knee pain at worst for the past month (03/25/2019); 5/10 R hip pain, 6/10 R knee pain at most for the past 7 days (05/20/19); 5.5/10 R knee pain (05/20/19); 8/10 R hip; 6/10 R knee at most for the past 7 days (06/10/2019)    Time  5    Period  Weeks    Status  Partially Met    Target Date  07/16/19            Plan - 07/01/19 1339  Clinical Impression Statement  Decreased R LE pain with treatment to promote R hip IR ROM, R glute max, trunk strength and improve posture. Based on pt subjective reports, pt able to perform movements correctly in the clinic and does not hurt as much but unsure if she is performing movements properly when out of the clinic.  Pt will benefit from continued skilled physical therapy services to decrease pain, improve strength and function.    Personal Factors and Comorbidities  Age;Comorbidity 3+;Time since onset of injury/illness/exacerbation;Fitness;Past/Current Experience    Comorbidities  Back, knee pain, hx of dizziness, hx of back surgery, hx of C-section    Examination-Activity Limitations  Squat    Stability/Clinical Decision Making  Evolving/Moderate complexity    Rehab Potential  Fair     Clinical Impairments Affecting Rehab Potential  difficulty with carry-over of improvements in clinic, chronicity of condition, age, multiple problem areas    PT Frequency  1x / week    PT Duration  Other (comment)   5 weeks   PT Treatment/Interventions  Therapeutic activities;Therapeutic exercise;Balance training;Neuromuscular re-education;Patient/family education;Manual techniques;Dry needling;Aquatic Therapy;Electrical Stimulation;Iontophoresis 12m/ml Dexamethasone;Gait training;Canalith Repostioning;Vestibular    PT Next Visit Plan   hip and knee strengthening, core strengthening, patellar mobility, manual techniques, modalities PRN    Consulted and Agree with Plan of Care  Patient       Patient will benefit from skilled therapeutic intervention in order to improve the following deficits and impairments:  Pain, Improper body mechanics, Postural dysfunction, Dizziness, Decreased strength, Difficulty walking, Decreased balance  Visit Diagnosis: Muscle weakness (generalized)  Right knee pain, unspecified chronicity  History of falling  Radiculopathy, lumbar region  Pain in right hip     Problem List Patient Active Problem List   Diagnosis Date Noted  . Obese 06/20/2016  . Status post right knee replacement 06/19/2016  . S/P left TKA 06/18/2016  . Spinal stenosis of lumbar region 09/14/2015    MJoneen BoersPT, DPT   07/01/2019, 6:20 PM  [facility] [address] [place] [address]Phone: [phone]  Fax:  [phone] Name: RTELECIA LAROCQUEMRN: [id number] of Birth: 405-19-1952

## 2019-07-06 ENCOUNTER — Ambulatory Visit: Payer: Medicare Other

## 2019-07-06 ENCOUNTER — Telehealth: Payer: Self-pay

## 2019-07-06 NOTE — Telephone Encounter (Signed)
No show. Called pt phone number provided but unable to leave a message secondary to mailbox being full.

## 2019-07-15 ENCOUNTER — Other Ambulatory Visit: Payer: Self-pay

## 2019-07-15 ENCOUNTER — Ambulatory Visit: Payer: Medicare Other

## 2019-07-15 DIAGNOSIS — M6281 Muscle weakness (generalized): Secondary | ICD-10-CM | POA: Diagnosis not present

## 2019-07-15 DIAGNOSIS — M25551 Pain in right hip: Secondary | ICD-10-CM

## 2019-07-15 DIAGNOSIS — M5416 Radiculopathy, lumbar region: Secondary | ICD-10-CM

## 2019-07-15 NOTE — Therapy (Signed)
[facility] [address] [place], [address] Phone: [phone]   Fax:  [phone]  Physical Therapy Treatment  Patient Details  Name: Destiny Shaw MRN: [id number] Date of Birth: [birth year] Referring Provider (PT): Paralee Cancel, MD    Encounter Date: 07/15/2019  PT End of Session - 07/15/19 1516    Visit Number  58    Number of Visits  102    Date for PT Re-Evaluation  07/16/19    Authorization Type  3    Authorization Time Period  of 10 progress note Medicare    PT Start Time  1516    PT Stop Time  1558    PT Time Calculation (min)  42 min    Activity Tolerance  Patient tolerated treatment well    Behavior During Therapy  [facility] for tasks assessed/performed       Past Medical History:  Diagnosis Date  . ADD (attention deficit disorder)   . Anemia   . Anginal pain ([place])    pt states has occas chest pain relates to indigestion; pt uses rest to relieve   . Anxiety   . Arthritis   . Concussion   . Depression   . Diabetes mellitus without complication ([place])   . Dizziness   . Fall   . GERD (gastroesophageal reflux disease)   . Headache   . History of urinary tract infection   . Hyperlipidemia   . Hypertension   . Hypothyroidism   . IBS (irritable bowel syndrome)   . Imbalance   . Numbness    right leg   . Numbness in both hands    comes and goes   . Pneumonia    last episode approx 1 year ago   . Sleep apnea   . Wears glasses     Past Surgical History:  Procedure Laterality Date  . BREAST CYST ASPIRATION Left 1980's   neg  . BREAST CYST EXCISION Right 1980's   neg  . BREAST LUMPECTOMY Right   . BREAST SURGERY    . CARPAL TUNNEL RELEASE    . CESAREAN SECTION     times 2  . COLONOSCOPY WITH PROPOFOL N/A 05/11/2015   Procedure: COLONOSCOPY WITH PROPOFOL;  Surgeon: Manya Silvas, MD;  Location: [facility] ENDOSCOPY;  Service: Endoscopy;  Laterality: N/A;  . DE QUERVAIN'S RELEASE Right   .  ESOPHAGOGASTRODUODENOSCOPY (EGD) WITH PROPOFOL N/A 05/11/2015   Procedure: ESOPHAGOGASTRODUODENOSCOPY (EGD) WITH PROPOFOL;  Surgeon: Manya Silvas, MD;  Location: [facility] ENDOSCOPY;  Service: Endoscopy;  Laterality: N/A;  . EYE SURGERY     laser surgery bilat   . HERNIA REPAIR    . KNEE ARTHROSCOPY    . LUMBAR LAMINECTOMY/DECOMPRESSION MICRODISCECTOMY Bilateral 09/14/2015   Procedure: MICRO LUMBAR BILATERAL DECOMPRESSION L4 - L5;  Surgeon: Susa Day, MD;  Location: [facility] ORS;  Service: Orthopedics;  Laterality: Bilateral;  . REDUCTION MAMMAPLASTY Bilateral 1980  . RHINOPLASTY    . TONSILLECTOMY    . TOTAL KNEE ARTHROPLASTY Left 06/18/2016   Procedure: LEFT TOTAL KNEE ARTHROPLASTY;  Surgeon: Paralee Cancel, MD;  Location: [facility] ORS;  Service: Orthopedics;  Laterality: Left;  Adductor Block  . TUBAL LIGATION    . UVULOPALATOPHARYNGOPLASTY      There were no vitals filed for this visit.  Subjective Assessment - 07/15/19 1518    Subjective  Had a hard time walking and keeoing her balance last week. Has been having R lateral thigh and hip  pain. Does not know what caused it and does not know what made it feel better. Did that hip adduction isometrics, as well as hip extension isometrics. Pt was trying to get out of bed last week and felt more pain afterwards. Pain went away after about 3 days. Today is her 3rd day (pain began Monday). Pt sleeps on the R side of the bed.    Patient is accompained by:  Family member    Pertinent History  Low back pain. Pt states having back surgery about 2-3 years ago. After she had her L TKA, her back started aggravating her due to her walking. Pt also fell about 4 weeks ago onto her L knee. Dr. Alvan Dame checked out her L knee. L knee was fine but has some inflammation.  Pt tries to keep it iced. Pt still recovering for her L TKA but the fall set her back.   Her L knee surgery was last year.  L knee still bothers her a lot.  Had surgery for her back before her L knee surgery.  Was doing well until her L knee surgery. The fall made it worse. Feels debilitating. Also has a hard time getting into and out of the car mainly due to her L knee.   Pt fell due to her L knee bucking on her. Pt was trying to pick something up from the side of the couch.  Also has a hard time getting up from the floor.  Pt states having bladder accidents since after her fall (pt was recommended to tell her MD about it).  Bowel problems since taking medications (was constapated, but after taking medications, pt had diarrhea which was difficult to control. Getting out of the metformin fixed the bowel issue).  Pt states tingling and numbness L lateral LE along the L5 dermatome.  Denies saddle anesthesia.  Pt states that her back problem is mainly on her L side.  Pt landed on her L knee when she fell.  No other falls within the last 6 months. Pt also states being very dizzy since her L knee surgery.  Had PT treatement for her dizziness before which did not help.  The room feels like it is spinning when she gets dizzy.  I feel like it is getting better then she does something.  5/10 low back soreness currently (7/10 at most for the past 7 days after negotiating stairs, 5/10 without doing stairs). 4/10 L knee pain currenglty with gait 5/10 L knee pain at most for the past 7 days.    Patient Stated Goals  Be better able to get into and out of her vehicle (midsized truck), into and out of chairs, be better able to roll in her bed with less L knee pain. Be able to get down on the floor and get back up.     Currently in Pain?  Other (Comment)   No pain level reported.                               PT Education - 07/15/19 1532    Education Details  ther-ex    Person(s) Educated  Patient    Methods  Explanation;Demonstration;Tactile cues;Verbal cues    Comprehension  Returned demonstration;Verbalized understanding      Objectives   MedbridgeAccess Code: [email or web address]  Latex free bands  usedif used   Sitting posture: L trunk rotation  Standing posture: slightLlateral shift  Pt states  that pressing her back against the chair feels better.  Gait observation: increased L lateral lean during L LE stance phase.      Therapeutic exercise  Sit <> supine 3x with proper log rolling technique  Supine position: increased lumbar extension, decreased R hip extension  hooklying position with one leg straight, glute max squeeze to promote ipsilateral hip flexor stretch  R 10x5 seconds for 3 sets  L 10x5 seconds for 3 sets  hooklying posterior pelvic tilt 10x5 seconds for 3 sets  Weak core muscles observed   Reviewed and given as part of HEP.     Improved exercise technique, movement at target joints, use of target muscles after mod verbal, visual, tactile cues.    Manual therapy  Supine STM R hip flexor/TFL muscles. Decreased tension.     Response to treatment Decreased difficulty with sit to stand and gait reported by pt after session.     Clinical impression Reproduction of R low back and R LE pain in supine position. Decreased R hip extension ROM and increased lumbar extension compensation observed. Worked on improving hip extension ROM, and abdominal strengthening to decrease extension stress to low back. Pt tolerated session well without aggravation of symptoms. Pt will benefit from continued skilled physical therapy services to decrease pain, improve strength and function.         PT Short Term Goals - 06/10/19 1849      PT SHORT TERM GOAL #1   Title  Patient will be independent with her HEP to help decrease back and L knee pain and improve ability to perform functional tasks.     Time  3    Period  Weeks    Status  On-going    Target Date  07/02/19        PT Long Term Goals - 06/10/19 1609      PT LONG TERM GOAL #1   Title  Patient will have a decrease in back pain to 3/10 or less at worst to promote  ability to ambulate, turn in bed, perform standing tasks with less pain.     Baseline  6/10 back pain at most (09/24/2017); 7-8/10 at most for the past 7 days, duration of pain is better since starting PT (10/29/2017); 5-6/10 at most for the past 7 days (11/18/2017); 8/10 at most for the past 7 days but the duration of pain is less compared to prior to starting PT (12/12/2017).  7/10 back pain at most for the past 7 days (pt states doing her exercises helps decrease her pain; 01/02/2018); walking (3/10), standing (4/10), bed mobility (0/10) 02/05/2018; 6/10 back pain at most for the past 7 days (03/03/2018); 6.5/10 back pain at most for the past 7 days (03/27/2018); 7-8/10 at most (05/08/2018); 7/10 back pain at worst (07/22/2018);  7-8/10 at most for the past 7 days (09/22/2018); 5-7/10 at worst for the past 7 days then calms down (10/27/2018); 6/10 back pain at most for the past 7 days (when pt cooks and cleans up in kitchen; 12/17/2018); 6-7/10 back spams pain at most for the past 7 days, usually when she is doing stuff in the kitchen (01/29/2019); 7/10 at worst for the past 7 days (03/25/2019); 7.5/10 (05/20/19); 8/10 low back pain at most for the past 7 days (06/10/2019)    Time  5    Period  Weeks    Status  On-going    Target Date  07/16/19      PT LONG TERM GOAL #  2   Title  Patient will have a decrease in L knee pain to 3/10 or less at worst to promote ability to ambulate, negotiate stairs, get into and out of a car more comfortably.     Baseline  6/10 L knee pain at most for the past 3 months (09/24/2017); 7-8/10 at most for the past 7 days, duration of pain is better since starting PT (10/29/2017); 7/10 at most for the past 7 days (11/18/2017); 6/10 L knee pain at most for the past 7 days (12/12/2017); 5/10 L knee pain at most for the past 7 days (pt states doing her exercises helps decrease pain; 01/02/2018); L knee 3/10 (02/05/2018); 4/10 at most for the past 7 days (03/03/2018); 5.5/10 L knee at most for the past 7  days (03/27/2018); 6-7/10 L knee pain at most (05/08/2018); 7/10 L knee pain at worst for the past month (07/22/2018);  6/10 L knee pain at most for the past 7 days (09/22/2018); 5-6/10 at most for the past 7 days sporadically, can get it better with exercise (10/27/2018); 6/10 L knee ache at most for the past 7 days (12/17/2018); 5-6/10 L knee pain at most for the past 7 days such as going up and down steps (01/29/2019); 5/10 at most for the past 7 days (03/25/2019); 5.5/10 (05/20/19); 4/10 (06/10/2019)    Time  5    Period  Weeks    Status  On-going    Target Date  07/16/19      PT LONG TERM GOAL #3   Title  Patient will improve TUG time to 12 seconds or less as a demonstration of improved functional mobility and balance.     Baseline  TUG no AD: 16.05 seconds on average (09/24/2017); 14.3 seconds on average (10/29/2017); 13 seconds average (11/21/2017); 16.67 seconds average (12/12/2017);  14.17 seconds average (01/02/2018); 11.8 sec sonds (02/05/2018)    Time  6    Period  Weeks    Status  Achieved      PT LONG TERM GOAL #4   Title  Patient will improve her back FOTO score by at least 10 points as a demonstration of improved funtion.      Baseline  Back FOTO: 33 (09/24/2017); 38 (10/29/2017); 36 (11/21/2017); 40 (12/12/2017); 42 (01/02/2018); 47 (03/03/2018)    Time  6    Period  Weeks    Status  Achieved      PT LONG TERM GOAL #5   Title  Patient will improve bilateral LE strength by at least 1/2 MMT grade to promote ability to perform standing tasks.     Time  6    Period  Weeks    Status  Achieved      PT LONG TERM GOAL #6   Title  Patient will improve her Modified Oswestry Low Back pain disablity questionnaire by at least 10% as a demonstration of improved function.     Baseline  48% (09/24/2017); 46% (10/29/2017), (11/21/2017); 56% (12/12/2017); 36% (01/02/2018), (03/03/2018)    Time  6    Period  Weeks    Status  Achieved      PT LONG TERM GOAL #7   Title  Pt will report decreased difficulty stepping  into and out of her bathtub as well as getting into and out of her truck to promote ability to get into and out of places.     Baseline  Pt reports difficulty stepping into and out of her bathtub as well as difficulty getting  into and out of her truck (01/02/2018); Pt states the truck and bath tub and difficult and painful.  (02/05/2018); Improving per subjective reports (03/03/2018); decreased difficulty with truck tranfser after cues for technique and practice, decreasing difficulty with stepping into and out of the bath tub based on subjective reports (03/27/2018 and 04/01/2018); Decreasing difficulty stepping into a bathtub. No difficulty getting into and out of her truck (05/08/2018), (07/22/2018); Difficulty stepping into and out of the bath tub. No difficulty with truck transfers (09/22/2018); Able to get into her bath tub, difficulty getting out (10/27/18); Still has difficulty getting into and out of bathtub (02/23/2019); Better able to step into the bathtub. Still has difficulty stepping out of the bathtub due to knee flexion (03/25/2019); No difficulty stepping into her bath tub. Still has difficulty stepping out due to decreased L knee flexion ROM (05/20/2019), (06/10/2019)    Time  5    Period  Weeks    Status  Partially Met    Target Date  07/16/19      PT LONG TERM GOAL #8   Title  Patient will improve her BERG balance test score to 46/56 or more as a demonstration of decreased fall risk     Baseline  42/56 (01/02/2018); 54/56 (02/05/2018)    Time  6    Period  Weeks    Status  Achieved      PT LONG TERM GOAL  #9   TITLE  Pt will report minimal to no dizziness related to vertigo to promote balance and decrease fall risk.     Baseline  Pt reports dizziness (related to vertigo) which seem to affect her balance (01/15/2018); Patient reports "a little bit every now and then" (02/05/2018); Pt reports dizziness and decreased balance (01/29/2019); Dizziness has improved some (02/23/2019), (03/25/2019); Still  has dizziness and vertigo; does the [place] for both sides instead of just the L side (06/10/2019)    Time  5    Period  Weeks    Status  On-going    Target Date  07/16/19      PT LONG TERM GOAL  #10   TITLE  Pt will be able to turn 360 degrees to the R and L at least 3 times without dizziness and no LOB to promote safety and decrease fall risk.    Baseline  360 degree turn to the R and L causes a loss of balance (12/17/2018), (01/29/2019). able to perform 3 times with each side with improved balance, minimal dizziness. (02/23/2019); Dizziness (05/20/19), slight dizziness (06/10/2019)    Time  5    Period  Weeks    Status  On-going    Target Date  07/16/19      PT LONG TERM GOAL  #11   TITLE  Patient will have a decrease in R hip and knee pain to 4/10 or less at worst to promote ability to ambulate with less discomfort.    Baseline  7/10 R hip and knee pain at worst for the past month (03/25/2019); 5/10 R hip pain, 6/10 R knee pain at most for the past 7 days (05/20/19); 5.5/10 R knee pain (05/20/19); 8/10 R hip; 6/10 R knee at most for the past 7 days (06/10/2019)    Time  5    Period  Weeks    Status  Partially Met    Target Date  07/16/19            Plan - 07/15/19 1533    Clinical Impression  Statement  Reproduction of R low back and R LE pain in supine position. Decreased R hip extension ROM and increased lumbar extension compensation observed. Worked on improving hip extension ROM, and abdominal strengthening to decrease extension stress to low back. Pt tolerated session well without aggravation of symptoms. Pt will benefit from continued skilled physical therapy services to decrease pain, improve strength and function.    Personal Factors and Comorbidities  Age;Comorbidity 3+;Time since onset of injury/illness/exacerbation;Fitness;Past/Current Experience    Comorbidities  Back, knee pain, hx of dizziness, hx of back surgery, hx of C-section    Examination-Activity Limitations  Squat     Stability/Clinical Decision Making  Evolving/Moderate complexity    Rehab Potential  Fair    Clinical Impairments Affecting Rehab Potential  difficulty with carry-over of improvements in clinic, chronicity of condition, age, multiple problem areas    PT Frequency  1x / week    PT Duration  Other (comment)   5 weeks   PT Treatment/Interventions  Therapeutic activities;Therapeutic exercise;Balance training;Neuromuscular re-education;Patient/family education;Manual techniques;Dry needling;Aquatic Therapy;Electrical Stimulation;Iontophoresis 38m/ml Dexamethasone;Gait training;Canalith Repostioning;Vestibular    PT Next Visit Plan   hip and knee strengthening, core strengthening, patellar mobility, manual techniques, modalities PRN    Consulted and Agree with Plan of Care  Patient       Patient will benefit from skilled therapeutic intervention in order to improve the following deficits and impairments:  Pain, Improper body mechanics, Postural dysfunction, Dizziness, Decreased strength, Difficulty walking, Decreased balance  Visit Diagnosis: Muscle weakness (generalized)  Radiculopathy, lumbar region  Pain in right hip     Problem List Patient Active Problem List   Diagnosis Date Noted  . Obese 06/20/2016  . Status post right knee replacement 06/19/2016  . S/P left TKA 06/18/2016  . Spinal stenosis of lumbar region 09/14/2015    MJoneen BoersPT, DPT   07/15/2019, 4:08 PM  [facility] [address] [place] [id number]: [phone]  Fax:  [phone] Name: RKENISHIA PLACKMRN: [id number] of Birth: 4Apr 10, 1952

## 2019-07-21 ENCOUNTER — Ambulatory Visit: Payer: Medicare Other

## 2019-07-22 ENCOUNTER — Other Ambulatory Visit: Payer: Self-pay

## 2019-07-22 ENCOUNTER — Ambulatory Visit: Payer: Medicare Other

## 2019-07-22 DIAGNOSIS — M5416 Radiculopathy, lumbar region: Secondary | ICD-10-CM

## 2019-07-22 DIAGNOSIS — M25561 Pain in right knee: Secondary | ICD-10-CM

## 2019-07-22 DIAGNOSIS — M25551 Pain in right hip: Secondary | ICD-10-CM

## 2019-07-22 DIAGNOSIS — M6281 Muscle weakness (generalized): Secondary | ICD-10-CM | POA: Diagnosis not present

## 2019-07-22 DIAGNOSIS — M25562 Pain in left knee: Secondary | ICD-10-CM

## 2019-07-22 DIAGNOSIS — R42 Dizziness and giddiness: Secondary | ICD-10-CM

## 2019-07-22 DIAGNOSIS — Z9181 History of falling: Secondary | ICD-10-CM

## 2019-07-22 NOTE — Therapy (Signed)
[facility] [address], [place], [address] Phone: [phone]   Fax:  [phone]  Physical Therapy Treatment  Patient Details  Name: Destiny Shaw MRN: [id number] Date of Birth: [birth year] Referring Provider (PT): Paralee Cancel, MD   Encounter Date: 07/22/2019  PT End of Session - 07/22/19 1435    Visit Number  44    Number of Visits  110    Date for PT Re-Evaluation  09/17/19    Authorization Type  4    Authorization Time Period  of 10 progress note Medicare    PT Start Time  1436    PT Stop Time  1518    PT Time Calculation (min)  42 min    Activity Tolerance  Patient tolerated treatment well    Behavior During Therapy  [facility] for tasks assessed/performed       Past Medical History:  Diagnosis Date  . ADD (attention deficit disorder)   . Anemia   . Anginal pain ([place])    pt states has occas chest pain relates to indigestion; pt uses rest to relieve   . Anxiety   . Arthritis   . Concussion   . Depression   . Diabetes mellitus without complication ([place])   . Dizziness   . Fall   . GERD (gastroesophageal reflux disease)   . Headache   . History of urinary tract infection   . Hyperlipidemia   . Hypertension   . Hypothyroidism   . IBS (irritable bowel syndrome)   . Imbalance   . Numbness    right leg   . Numbness in both hands    comes and goes   . Pneumonia    last episode approx 1 year ago   . Sleep apnea   . Wears glasses     Past Surgical History:  Procedure Laterality Date  . BREAST CYST ASPIRATION Left 1980's   neg  . BREAST CYST EXCISION Right 1980's   neg  . BREAST LUMPECTOMY Right   . BREAST SURGERY    . CARPAL TUNNEL RELEASE    . CESAREAN SECTION     times 2  . COLONOSCOPY WITH PROPOFOL N/A 05/11/2015   Procedure: COLONOSCOPY WITH PROPOFOL;  Surgeon: Manya Silvas, MD;  Location: [facility] ENDOSCOPY;  Service: Endoscopy;  Laterality: N/A;  . DE QUERVAIN'S RELEASE Right   .  ESOPHAGOGASTRODUODENOSCOPY (EGD) WITH PROPOFOL N/A 05/11/2015   Procedure: ESOPHAGOGASTRODUODENOSCOPY (EGD) WITH PROPOFOL;  Surgeon: Manya Silvas, MD;  Location: [facility] ENDOSCOPY;  Service: Endoscopy;  Laterality: N/A;  . EYE SURGERY     laser surgery bilat   . HERNIA REPAIR    . KNEE ARTHROSCOPY    . LUMBAR LAMINECTOMY/DECOMPRESSION MICRODISCECTOMY Bilateral 09/14/2015   Procedure: MICRO LUMBAR BILATERAL DECOMPRESSION L4 - L5;  Surgeon: Susa Day, MD;  Location: [facility] ORS;  Service: Orthopedics;  Laterality: Bilateral;  . REDUCTION MAMMAPLASTY Bilateral 1980  . RHINOPLASTY    . TONSILLECTOMY    . TOTAL KNEE ARTHROPLASTY Left 06/18/2016   Procedure: LEFT TOTAL KNEE ARTHROPLASTY;  Surgeon: Paralee Cancel, MD;  Location: [facility] ORS;  Service: Orthopedics;  Laterality: Left;  Adductor Block  . TUBAL LIGATION    . UVULOPALATOPHARYNGOPLASTY      There were no vitals filed for this visit.  Subjective Assessment - 07/22/19 1437    Subjective  Doing alright today. A little achy at the R knee (6.5/10) and R hip (6.5/10) but has been worse. L  knee is feeling so much better. No L knee pain currently.  Pt states having a memory laps since her fall hitting her head at work.  Memory lapse is getting better.    Patient is accompained by:  Family member    Pertinent History  Low back pain. Pt states having back surgery about 2-3 years ago. After she had her L TKA, her back started aggravating her due to her walking. Pt also fell about 4 weeks ago onto her L knee. Dr. Alvan Dame checked out her L knee. L knee was fine but has some inflammation.  Pt tries to keep it iced. Pt still recovering for her L TKA but the fall set her back.   Her L knee surgery was last year.  L knee still bothers her a lot.  Had surgery for her back before her L knee surgery. Was doing well until her L knee surgery. The fall made it worse. Feels debilitating. Also has a hard time getting into and out of the car mainly due to her L knee.   Pt fell  due to her L knee bucking on her. Pt was trying to pick something up from the side of the couch.  Also has a hard time getting up from the floor.  Pt states having bladder accidents since after her fall (pt was recommended to tell her MD about it).  Bowel problems since taking medications (was constapated, but after taking medications, pt had diarrhea which was difficult to control. Getting out of the metformin fixed the bowel issue).  Pt states tingling and numbness L lateral LE along the L5 dermatome.  Denies saddle anesthesia.  Pt states that her back problem is mainly on her L side.  Pt landed on her L knee when she fell.  No other falls within the last 6 months. Pt also states being very dizzy since her L knee surgery.  Had PT treatement for her dizziness before which did not help.  The room feels like it is spinning when she gets dizzy.  I feel like it is getting better then she does something.  5/10 low back soreness currently (7/10 at most for the past 7 days after negotiating stairs, 5/10 without doing stairs). 4/10 L knee pain currenglty with gait 5/10 L knee pain at most for the past 7 days.    Patient Stated Goals  Be better able to get into and out of her vehicle (midsized truck), into and out of chairs, be better able to roll in her bed with less L knee pain. Be able to get down on the floor and get back up.     Currently in Pain?  Yes    Pain Score  7    6.5/10 R LE                               PT Education - 07/22/19 1448    Education Details  ther-ex    Person(s) Educated  Patient    Methods  Explanation;Demonstration;Tactile cues;Verbal cues    Comprehension  Verbalized understanding;Returned demonstration       Objectives   MedbridgeAccess Code: [email or web address]  Latex free bands usedif used   Sitting posture: L trunk rotation  Standing posture: slightLlateral shift  Pt states that pressing her back against the chair feels  better.  Gait observation: increased L lateral lean during L LE stance phase.      Therapeutic  exercise  Reviewed overall progress with PT towards goals  Reviewed plan of care with pt: 1x/week for 8 weeks  Continue PT secondary to memory lapse reported by pt from her fall and head injury which makes it difficult for her to perform the exercises at home properly and to know which exercise she needs to do to improve her comfort level.   Turning 360 degrees   R 3x. Dizziness when pt stops. Disappears after a few seconds  L 3x. Unsteady when pt stops initially,   Pt was recommended to only perform her Epley maneuver for the L side only since pt states pt doing the procedure for both sides. Pt was informed that the positive side that needs work is the L side. Pt verbalized understanding.    hooklying position with one leg straight, glute max squeeze to promote ipsilateral hip flexor stretch             R 10x5 seconds for 3 sets             L 10x5 seconds for 3 sets  hooklying posterior pelvic tilt 10x5 seconds for 3 sets             felt good reported by pt  S/L hip abduction. PT assist for proper position and technique  R 10x3  L 10x2. Difficult for L side   Improved exercise technique, movement at target joints, use of target muscles after mod verbal, visual, tactile cues.     Response to treatment Decreased difficulty with sit to stand and gait reported by pt after session.     Clinical impression Pt overall has made improvement in function and strength. Overall decrease in L knee pain. Pt also demonstrates decreased duration of low back and R LE pain when it occurs since starting PT. Pt will benefit from continued skilled physical therapy services secondary to difficulty performing her HEP at home properly and knowing which ones to perform secondary to her memory lapse from her head injury (as reported by pt). No low back, or R LE pain with gait  after session.      PT Short Term Goals - 06/10/19 1849      PT SHORT TERM GOAL #1   Title  Patient will be independent with her HEP to help decrease back and L knee pain and improve ability to perform functional tasks.     Time  3    Period  Weeks    Status  On-going    Target Date  07/02/19        PT Long Term Goals - 07/22/19 1439      PT LONG TERM GOAL #1   Title  Patient will have a decrease in back pain to 3/10 or less at worst to promote ability to ambulate, turn in bed, perform standing tasks with less pain.     Baseline  6/10 back pain at most (09/24/2017); 7-8/10 at most for the past 7 days, duration of pain is better since starting PT (10/29/2017); 5-6/10 at most for the past 7 days (11/18/2017); 8/10 at most for the past 7 days but the duration of pain is less compared to prior to starting PT (12/12/2017).  7/10 back pain at most for the past 7 days (pt states doing her exercises helps decrease her pain; 01/02/2018); walking (3/10), standing (4/10), bed mobility (0/10) 02/05/2018; 6/10 back pain at most for the past 7 days (03/03/2018); 6.5/10 back pain at most for the past 7  days (03/27/2018); 7-8/10 at most (05/08/2018); 7/10 back pain at worst (07/22/2018);  7-8/10 at most for the past 7 days (09/22/2018); 5-7/10 at worst for the past 7 days then calms down (10/27/2018); 6/10 back pain at most for the past 7 days (when pt cooks and cleans up in kitchen; 12/17/2018); 6-7/10 back spams pain at most for the past 7 days, usually when she is doing stuff in the kitchen (01/29/2019); 7/10 at worst for the past 7 days (03/25/2019); 7.5/10 (05/20/19); 8/10 low back pain at most for the past 7 days (06/10/2019); 7/10 at worst; Easier to decrease back pain since starting PT (07/22/2019)    Time  8    Period  Weeks    Status  On-going    Target Date  09/17/19      PT LONG TERM GOAL #2   Title  Patient will have a decrease in L knee pain to 3/10 or less at worst to promote ability to ambulate, negotiate  stairs, get into and out of a car more comfortably.     Baseline  6/10 L knee pain at most for the past 3 months (09/24/2017); 7-8/10 at most for the past 7 days, duration of pain is better since starting PT (10/29/2017); 7/10 at most for the past 7 days (11/18/2017); 6/10 L knee pain at most for the past 7 days (12/12/2017); 5/10 L knee pain at most for the past 7 days (pt states doing her exercises helps decrease pain; 01/02/2018); L knee 3/10 (02/05/2018); 4/10 at most for the past 7 days (03/03/2018); 5.5/10 L knee at most for the past 7 days (03/27/2018); 6-7/10 L knee pain at most (05/08/2018); 7/10 L knee pain at worst for the past month (07/22/2018);  6/10 L knee pain at most for the past 7 days (09/22/2018); 5-6/10 at most for the past 7 days sporadically, can get it better with exercise (10/27/2018); 6/10 L knee ache at most for the past 7 days (12/17/2018); 5-6/10 L knee pain at most for the past 7 days such as going up and down steps (01/29/2019); 5/10 at most for the past 7 days (03/25/2019); 5.5/10 (05/20/19); 4/10 (06/10/2019); 4/10 (07/22/2019)    Time  8    Period  Weeks    Status  Partially Met    Target Date  09/17/19      PT LONG TERM GOAL #3   Title  Patient will improve TUG time to 12 seconds or less as a demonstration of improved functional mobility and balance.     Baseline  TUG no AD: 16.05 seconds on average (09/24/2017); 14.3 seconds on average (10/29/2017); 13 seconds average (11/21/2017); 16.67 seconds average (12/12/2017);  14.17 seconds average (01/02/2018); 11.8 sec sonds (02/05/2018)    Time  6    Period  Weeks    Status  Achieved      PT LONG TERM GOAL #4   Title  Patient will improve her back FOTO score by at least 10 points as a demonstration of improved funtion.      Baseline  Back FOTO: 33 (09/24/2017); 38 (10/29/2017); 36 (11/21/2017); 40 (12/12/2017); 42 (01/02/2018); 47 (03/03/2018)    Time  6    Period  Weeks    Status  Achieved      PT LONG TERM GOAL #5   Title  Patient will  improve bilateral LE strength by at least 1/2 MMT grade to promote ability to perform standing tasks.     Time  6  Period  Weeks    Status  Achieved      PT LONG TERM GOAL #6   Title  Patient will improve her Modified Oswestry Low Back pain disablity questionnaire by at least 10% as a demonstration of improved function.     Baseline  48% (09/24/2017); 46% (10/29/2017), (11/21/2017); 56% (12/12/2017); 36% (01/02/2018), (03/03/2018)    Time  6    Period  Weeks    Status  Achieved      PT LONG TERM GOAL #7   Title  Pt will report decreased difficulty stepping into and out of her bathtub as well as getting into and out of her truck to promote ability to get into and out of places.     Baseline  Pt reports difficulty stepping into and out of her bathtub as well as difficulty getting into and out of her truck (01/02/2018); Pt states the truck and bath tub and difficult and painful.  (02/05/2018); Improving per subjective reports (03/03/2018); decreased difficulty with truck tranfser after cues for technique and practice, decreasing difficulty with stepping into and out of the bath tub based on subjective reports (03/27/2018 and 04/01/2018); Decreasing difficulty stepping into a bathtub. No difficulty getting into and out of her truck (05/08/2018), (07/22/2018); Difficulty stepping into and out of the bath tub. No difficulty with truck transfers (09/22/2018); Able to get into her bath tub, difficulty getting out (10/27/18); Still has difficulty getting into and out of bathtub (02/23/2019); Better able to step into the bathtub. Still has difficulty stepping out of the bathtub due to knee flexion (03/25/2019); No difficulty stepping into her bath tub. Still has difficulty stepping out due to decreased L knee flexion ROM (05/20/2019), (06/10/2019), (07/22/2019)    Time  8    Period  Weeks    Status  Partially Met    Target Date  09/17/19      PT LONG TERM GOAL #8   Title  Patient will improve her BERG balance test score to  46/56 or more as a demonstration of decreased fall risk     Baseline  42/56 (01/02/2018); 54/56 (02/05/2018)    Time  6    Period  Weeks    Status  Achieved      PT LONG TERM GOAL  #9   TITLE  Pt will report minimal to no dizziness related to vertigo to promote balance and decrease fall risk.     Baseline  Pt reports dizziness (related to vertigo) which seem to affect her balance (01/15/2018); Patient reports "a little bit every now and then" (02/05/2018); Pt reports dizziness and decreased balance (01/29/2019); Dizziness has improved some (02/23/2019), (03/25/2019); Still has dizziness and vertigo; does the Epley for both sides instead of just the L side (06/10/2019)    Time  8    Period  Weeks    Status  On-going    Target Date  09/17/19      PT LONG TERM GOAL  #10   TITLE  Pt will be able to turn 360 degrees to the R and L at least 3 times without dizziness and no LOB to promote safety and decrease fall risk.    Baseline  360 degree turn to the R and L causes a loss of balance (12/17/2018), (01/29/2019). able to perform 3 times with each side with improved balance, minimal dizziness. (02/23/2019); Dizziness (05/20/19), slight dizziness (06/10/2019), (07/22/2019)    Time  8    Period  Weeks    Status  On-going    Target Date  09/17/19      PT LONG TERM GOAL  #11   TITLE  Patient will have a decrease in R hip and knee pain to 4/10 or less at worst to promote ability to ambulate with less discomfort.    Baseline  7/10 R hip and knee pain at worst for the past month (03/25/2019); 5/10 R hip pain, 6/10 R knee pain at most for the past 7 days (05/20/19); 5.5/10 R knee pain (05/20/19); 8/10 R hip; 6/10 R knee at most for the past 7 days (06/10/2019); 7/10, easier to decrease pain since starting PT (07/22/2019)    Time  8    Period  Weeks    Status  On-going    Target Date  09/17/19            Plan - 07/22/19 1504    Clinical Impression Statement  Pt overall has made improvement in function and  strength. Overall decrease in L knee pain. Pt also demonstrates decreased duration of low back and R LE pain when it occurs since starting PT. Pt will benefit from continued skilled physical therapy services secondary to difficulty performing her HEP at home properly and knowing which ones to perform secondary to her memory lapse from her head injury (as reported by pt). No low back, or R LE pain with gait after session.    Personal Factors and Comorbidities  Age;Comorbidity 3+;Time since onset of injury/illness/exacerbation;Fitness;Past/Current Experience    Comorbidities  Back, knee pain, hx of dizziness, hx of back surgery, hx of C-section; memory lapse    Examination-Activity Limitations  Squat    Stability/Clinical Decision Making  Stable/Uncomplicated    Clinical Decision Making  Low    Clinical Presentation due to:  decreased duration of pain    Rehab Potential  Fair    Clinical Impairments Affecting Rehab Potential  difficulty with carry-over of improvements in clinic, chronicity of condition, age, multiple problem areas; MEMORY lapse    PT Frequency  1x / week    PT Duration  Other (comment)   5 weeks   PT Treatment/Interventions  Therapeutic activities;Therapeutic exercise;Balance training;Neuromuscular re-education;Patient/family education;Manual techniques;Dry needling;Aquatic Therapy;Electrical Stimulation;Iontophoresis 50m/ml Dexamethasone;Gait training;Canalith Repostioning;Vestibular    PT Next Visit Plan   hip and knee strengthening, core strengthening, patellar mobility, manual techniques, modalities PRN    Consulted and Agree with Plan of Care  Patient       Patient will benefit from skilled therapeutic intervention in order to improve the following deficits and impairments:  Pain, Improper body mechanics, Postural dysfunction, Dizziness, Decreased strength, Difficulty walking, Decreased balance  Visit Diagnosis: Muscle weakness (generalized) - Plan: PT plan of care  cert/re-cert  Radiculopathy, lumbar region - Plan: PT plan of care cert/re-cert  Pain in right hip - Plan: PT plan of care cert/re-cert  Right knee pain, unspecified chronicity - Plan: PT plan of care cert/re-cert  History of falling - Plan: PT plan of care cert/re-cert  Dizziness and giddiness - Plan: PT plan of care cert/re-cert  Chronic pain of left knee - Plan: PT plan of care cert/re-cert     Problem List Patient Active Problem List   Diagnosis Date Noted  . Obese 06/20/2016  . Status post right knee replacement 06/19/2016  . S/P left TKA 06/18/2016  . Spinal stenosis of lumbar region 09/14/2015    MJoneen BoersPT, DPT   07/22/2019, 3:36 PM  [facility] [address] [place], [place], [address] Phone: [phone]   Fax:  [phone]  Name: Destiny Shaw MRN: [id number] Date of Birth: 1950/06/29

## 2019-07-29 ENCOUNTER — Ambulatory Visit: Payer: Medicare Other | Attending: Orthopedic Surgery

## 2019-07-29 ENCOUNTER — Other Ambulatory Visit: Payer: Self-pay

## 2019-07-29 DIAGNOSIS — M25551 Pain in right hip: Secondary | ICD-10-CM | POA: Diagnosis present

## 2019-07-29 DIAGNOSIS — M25561 Pain in right knee: Secondary | ICD-10-CM | POA: Diagnosis present

## 2019-07-29 DIAGNOSIS — Z9181 History of falling: Secondary | ICD-10-CM

## 2019-07-29 DIAGNOSIS — M5416 Radiculopathy, lumbar region: Secondary | ICD-10-CM | POA: Insufficient documentation

## 2019-07-29 DIAGNOSIS — M6281 Muscle weakness (generalized): Secondary | ICD-10-CM | POA: Diagnosis present

## 2019-07-29 NOTE — Therapy (Signed)
[facility] [address], [place], [address] Phone: [phone]   Fax:  [phone]  Physical Therapy Treatment  Patient Details  Name: Destiny Shaw MRN: [id number] Date of Birth: 09-07-1950 Referring Provider (PT): Paralee Cancel, MD   Encounter Date: 07/29/2019  PT End of Session - 07/29/19 1304    Visit Number  99    Number of Visits  110    Date for PT Re-Evaluation  09/17/19    Authorization Type  5    Authorization Time Period  of 10 progress note Medicare    PT Start Time  1304    PT Stop Time  1345    PT Time Calculation (min)  41 min    Activity Tolerance  Patient tolerated treatment well    Behavior During Therapy  [facility] for tasks assessed/performed       Past Medical History:  Diagnosis Date  . ADD (attention deficit disorder)   . Anemia   . Anginal pain ([place])    pt states has occas chest pain relates to indigestion; pt uses rest to relieve   . Anxiety   . Arthritis   . Concussion   . Depression   . Diabetes mellitus without complication ([place])   . Dizziness   . Fall   . GERD (gastroesophageal reflux disease)   . Headache   . History of urinary tract infection   . Hyperlipidemia   . Hypertension   . Hypothyroidism   . IBS (irritable bowel syndrome)   . Imbalance   . Numbness    right leg   . Numbness in both hands    comes and goes   . Pneumonia    last episode approx 1 year ago   . Sleep apnea   . Wears glasses     Past Surgical History:  Procedure Laterality Date  . BREAST CYST ASPIRATION Left 1980's   neg  . BREAST CYST EXCISION Right 1980's   neg  . BREAST LUMPECTOMY Right   . BREAST SURGERY    . CARPAL TUNNEL RELEASE    . CESAREAN SECTION     times 2  . COLONOSCOPY WITH PROPOFOL N/A 05/11/2015   Procedure: COLONOSCOPY WITH PROPOFOL;  Surgeon: Manya Silvas, MD;  Location: [facility] ENDOSCOPY;  Service: Endoscopy;  Laterality: N/A;  . DE QUERVAIN'S RELEASE Right   .  ESOPHAGOGASTRODUODENOSCOPY (EGD) WITH PROPOFOL N/A 05/11/2015   Procedure: ESOPHAGOGASTRODUODENOSCOPY (EGD) WITH PROPOFOL;  Surgeon: Manya Silvas, MD;  Location: [facility] ENDOSCOPY;  Service: Endoscopy;  Laterality: N/A;  . EYE SURGERY     laser surgery bilat   . HERNIA REPAIR    . KNEE ARTHROSCOPY    . LUMBAR LAMINECTOMY/DECOMPRESSION MICRODISCECTOMY Bilateral 09/14/2015   Procedure: MICRO LUMBAR BILATERAL DECOMPRESSION L4 - L5;  Surgeon: Susa Day, MD;  Location: [facility] ORS;  Service: Orthopedics;  Laterality: Bilateral;  . REDUCTION MAMMAPLASTY Bilateral 1980  . RHINOPLASTY    . TONSILLECTOMY    . TOTAL KNEE ARTHROPLASTY Left 06/18/2016   Procedure: LEFT TOTAL KNEE ARTHROPLASTY;  Surgeon: Paralee Cancel, MD;  Location: [facility] ORS;  Service: Orthopedics;  Laterality: Left;  Adductor Block  . TUBAL LIGATION    . UVULOPALATOPHARYNGOPLASTY      There were no vitals filed for this visit.  Subjective Assessment - 07/29/19 1306    Subjective  L knee is doing good. R knee is tight today. Low back and R hip is not too bad today.  7/10 R knee pain during gait. Working out helps decrease pain level.    Patient is accompained by:  Family member    Pertinent History  Low back pain. Pt states having back surgery about 2-3 years ago. After she had her L TKA, her back started aggravating her due to her walking. Pt also fell about 4 weeks ago onto her L knee. Dr. Alvan Dame checked out her L knee. L knee was fine but has some inflammation.  Pt tries to keep it iced. Pt still recovering for her L TKA but the fall set her back.   Her L knee surgery was last year.  L knee still bothers her a lot.  Had surgery for her back before her L knee surgery. Was doing well until her L knee surgery. The fall made it worse. Feels debilitating. Also has a hard time getting into and out of the car mainly due to her L knee.   Pt fell due to her L knee bucking on her. Pt was trying to pick something up from the side of the couch.  Also has a  hard time getting up from the floor.  Pt states having bladder accidents since after her fall (pt was recommended to tell her MD about it).  Bowel problems since taking medications (was constapated, but after taking medications, pt had diarrhea which was difficult to control. Getting out of the metformin fixed the bowel issue).  Pt states tingling and numbness L lateral LE along the L5 dermatome.  Denies saddle anesthesia.  Pt states that her back problem is mainly on her L side.  Pt landed on her L knee when she fell.  No other falls within the last 6 months. Pt also states being very dizzy since her L knee surgery.  Had PT treatement for her dizziness before which did not help.  The room feels like it is spinning when she gets dizzy.  I feel like it is getting better then she does something.  5/10 low back soreness currently (7/10 at most for the past 7 days after negotiating stairs, 5/10 without doing stairs). 4/10 L knee pain currenglty with gait 5/10 L knee pain at most for the past 7 days.    Patient Stated Goals  Be better able to get into and out of her vehicle (midsized truck), into and out of chairs, be better able to roll in her bed with less L knee pain. Be able to get down on the floor and get back up.     Currently in Pain?  Yes    Pain Score  7     Pain Location  Knee    Pain Orientation  Right                                PT Education - 07/29/19 1311    Education Details  ther-ex    Person(s) Educated  Patient    Methods  Explanation;Demonstration;Tactile cues;Verbal cues    Comprehension  Returned demonstration;Verbalized understanding      Objectives   MedbridgeAccess Code: [email or web address]  Latex free bands usedif used   Sitting posture: L trunk rotation  Standing posture: slightLlateral shift  Pt states that pressing her back against the chair feels better.  Gait observation: increased L lateral lean during L LE stance  phase.      Therapeutic exercise   S/L hip abduction. PT assist for proper position  and technique             R 10x3             L 10x3   hooklying posterior pelvic tilt 10x5 seconds for 3 sets felt good reported by pt  hooklying position with one leg straight, glute max squeeze to promote ipsilateral hip flexor stretch R 10x5 seconds for 3 sets L 10x5 seconds for 3 sets  Supine with hip at 90/90 position  R hip IR AAROM with PT 10x3 to promote mobility and decrease R hip stiffness.     Improved exercise technique, movement at target joints, use of target muscles after mod verbal, visual, tactile cues.   Manual therapy  Supine STM R lateral knee to decrease fascial restrictions Supine medial glide R patella grade 3 to improve mobility     Response to treatment Pt tolerated session well without aggravation of symptoms. Decreased R knee pain after session.     Clinical impression Decreased L lateral lean compensation during L LE stance phase of gait observed. Pt able to maintain good progress in decreased low back, R hip and L knee pain based on subective reports. Continued working on improving hip extension ROM, glute and trunk strengthening to help continue progress. Pt tolerated session well without aggravation of symptoms. Pt will benefit from continued skilled physical therapy services to decrease pain, improve strength and function.    PT Short Term Goals - 06/10/19 1849      PT SHORT TERM GOAL #1   Title  Patient will be independent with her HEP to help decrease back and L knee pain and improve ability to perform functional tasks.     Time  3    Period  Weeks    Status  On-going    Target Date  07/02/19        PT Long Term Goals - 07/22/19 1439      PT LONG TERM GOAL #1   Title  Patient will have a decrease in back pain to 3/10 or less at worst to promote ability to ambulate, turn in bed,  perform standing tasks with less pain.     Baseline  6/10 back pain at most (09/24/2017); 7-8/10 at most for the past 7 days, duration of pain is better since starting PT (10/29/2017); 5-6/10 at most for the past 7 days (11/18/2017); 8/10 at most for the past 7 days but the duration of pain is less compared to prior to starting PT (12/12/2017).  7/10 back pain at most for the past 7 days (pt states doing her exercises helps decrease her pain; 01/02/2018); walking (3/10), standing (4/10), bed mobility (0/10) 02/05/2018; 6/10 back pain at most for the past 7 days (03/03/2018); 6.5/10 back pain at most for the past 7 days (03/27/2018); 7-8/10 at most (05/08/2018); 7/10 back pain at worst (07/22/2018);  7-8/10 at most for the past 7 days (09/22/2018); 5-7/10 at worst for the past 7 days then calms down (10/27/2018); 6/10 back pain at most for the past 7 days (when pt cooks and cleans up in kitchen; 12/17/2018); 6-7/10 back spams pain at most for the past 7 days, usually when she is doing stuff in the kitchen (01/29/2019); 7/10 at worst for the past 7 days (03/25/2019); 7.5/10 (05/20/19); 8/10 low back pain at most for the past 7 days (06/10/2019); 7/10 at worst; Easier to decrease back pain since starting PT (07/22/2019)    Time  8    Period  Weeks  Status  On-going    Target Date  09/17/19      PT LONG TERM GOAL #2   Title  Patient will have a decrease in L knee pain to 3/10 or less at worst to promote ability to ambulate, negotiate stairs, get into and out of a car more comfortably.     Baseline  6/10 L knee pain at most for the past 3 months (09/24/2017); 7-8/10 at most for the past 7 days, duration of pain is better since starting PT (10/29/2017); 7/10 at most for the past 7 days (11/18/2017); 6/10 L knee pain at most for the past 7 days (12/12/2017); 5/10 L knee pain at most for the past 7 days (pt states doing her exercises helps decrease pain; 01/02/2018); L knee 3/10 (02/05/2018); 4/10 at most for the past 7 days  (03/03/2018); 5.5/10 L knee at most for the past 7 days (03/27/2018); 6-7/10 L knee pain at most (05/08/2018); 7/10 L knee pain at worst for the past month (07/22/2018);  6/10 L knee pain at most for the past 7 days (09/22/2018); 5-6/10 at most for the past 7 days sporadically, can get it better with exercise (10/27/2018); 6/10 L knee ache at most for the past 7 days (12/17/2018); 5-6/10 L knee pain at most for the past 7 days such as going up and down steps (01/29/2019); 5/10 at most for the past 7 days (03/25/2019); 5.5/10 (05/20/19); 4/10 (06/10/2019); 4/10 (07/22/2019)    Time  8    Period  Weeks    Status  Partially Met    Target Date  09/17/19      PT LONG TERM GOAL #3   Title  Patient will improve TUG time to 12 seconds or less as a demonstration of improved functional mobility and balance.     Baseline  TUG no AD: 16.05 seconds on average (09/24/2017); 14.3 seconds on average (10/29/2017); 13 seconds average (11/21/2017); 16.67 seconds average (12/12/2017);  14.17 seconds average (01/02/2018); 11.8 sec sonds (02/05/2018)    Time  6    Period  Weeks    Status  Achieved      PT LONG TERM GOAL #4   Title  Patient will improve her back FOTO score by at least 10 points as a demonstration of improved funtion.      Baseline  Back FOTO: 33 (09/24/2017); 38 (10/29/2017); 36 (11/21/2017); 40 (12/12/2017); 42 (01/02/2018); 47 (03/03/2018)    Time  6    Period  Weeks    Status  Achieved      PT LONG TERM GOAL #5   Title  Patient will improve bilateral LE strength by at least 1/2 MMT grade to promote ability to perform standing tasks.     Time  6    Period  Weeks    Status  Achieved      PT LONG TERM GOAL #6   Title  Patient will improve her Modified Oswestry Low Back pain disablity questionnaire by at least 10% as a demonstration of improved function.     Baseline  48% (09/24/2017); 46% (10/29/2017), (11/21/2017); 56% (12/12/2017); 36% (01/02/2018), (03/03/2018)    Time  6    Period  Weeks    Status  Achieved      PT  LONG TERM GOAL #7   Title  Pt will report decreased difficulty stepping into and out of her bathtub as well as getting into and out of her truck to promote ability to get into and out of  places.     Baseline  Pt reports difficulty stepping into and out of her bathtub as well as difficulty getting into and out of her truck (01/02/2018); Pt states the truck and bath tub and difficult and painful.  (02/05/2018); Improving per subjective reports (03/03/2018); decreased difficulty with truck tranfser after cues for technique and practice, decreasing difficulty with stepping into and out of the bath tub based on subjective reports (03/27/2018 and 04/01/2018); Decreasing difficulty stepping into a bathtub. No difficulty getting into and out of her truck (05/08/2018), (07/22/2018); Difficulty stepping into and out of the bath tub. No difficulty with truck transfers (09/22/2018); Able to get into her bath tub, difficulty getting out (10/27/18); Still has difficulty getting into and out of bathtub (02/23/2019); Better able to step into the bathtub. Still has difficulty stepping out of the bathtub due to knee flexion (03/25/2019); No difficulty stepping into her bath tub. Still has difficulty stepping out due to decreased L knee flexion ROM (05/20/2019), (06/10/2019), (07/22/2019)    Time  8    Period  Weeks    Status  Partially Met    Target Date  09/17/19      PT LONG TERM GOAL #8   Title  Patient will improve her BERG balance test score to 46/56 or more as a demonstration of decreased fall risk     Baseline  42/56 (01/02/2018); 54/56 (02/05/2018)    Time  6    Period  Weeks    Status  Achieved      PT LONG TERM GOAL  #9   TITLE  Pt will report minimal to no dizziness related to vertigo to promote balance and decrease fall risk.     Baseline  Pt reports dizziness (related to vertigo) which seem to affect her balance (01/15/2018); Patient reports "a little bit every now and then" (02/05/2018); Pt reports dizziness and  decreased balance (01/29/2019); Dizziness has improved some (02/23/2019), (03/25/2019); Still has dizziness and vertigo; does the [place] for both sides instead of just the L side (06/10/2019)    Time  8    Period  Weeks    Status  On-going    Target Date  09/17/19      PT LONG TERM GOAL  #10   TITLE  Pt will be able to turn 360 degrees to the R and L at least 3 times without dizziness and no LOB to promote safety and decrease fall risk.    Baseline  360 degree turn to the R and L causes a loss of balance (12/17/2018), (01/29/2019). able to perform 3 times with each side with improved balance, minimal dizziness. (02/23/2019); Dizziness (05/20/19), slight dizziness (06/10/2019), (07/22/2019)    Time  8    Period  Weeks    Status  On-going    Target Date  09/17/19      PT LONG TERM GOAL  #11   TITLE  Patient will have a decrease in R hip and knee pain to 4/10 or less at worst to promote ability to ambulate with less discomfort.    Baseline  7/10 R hip and knee pain at worst for the past month (03/25/2019); 5/10 R hip pain, 6/10 R knee pain at most for the past 7 days (05/20/19); 5.5/10 R knee pain (05/20/19); 8/10 R hip; 6/10 R knee at most for the past 7 days (06/10/2019); 7/10, easier to decrease pain since starting PT (07/22/2019)    Time  8    Period  Weeks  Status  On-going    Target Date  09/17/19            Plan - 07/29/19 1314    Clinical Impression Statement  Decreased L lateral lean compensation during L LE stance phase of gait observed. Pt able to maintain good progress in decreased low back, R hip and L knee pain based on subective reports. Continued working on improving hip extension ROM, glute and trunk strengthening to help continue progress. Pt tolerated session well without aggravation of symptoms. Pt will benefit from continued skilled physical therapy services to decrease pain, improve strength and function.    Personal Factors and Comorbidities  Age;Comorbidity 3+;Time since  onset of injury/illness/exacerbation;Fitness;Past/Current Experience    Comorbidities  Back, knee pain, hx of dizziness, hx of back surgery, hx of C-section; memory lapse    Examination-Activity Limitations  Squat    Stability/Clinical Decision Making  Stable/Uncomplicated    Rehab Potential  Fair    Clinical Impairments Affecting Rehab Potential  difficulty with carry-over of improvements in clinic, chronicity of condition, age, multiple problem areas; MEMORY lapse    PT Frequency  1x / week    PT Duration  Other (comment)   5 weeks   PT Treatment/Interventions  Therapeutic activities;Therapeutic exercise;Balance training;Neuromuscular re-education;Patient/family education;Manual techniques;Dry needling;Aquatic Therapy;Electrical Stimulation;Iontophoresis 66m/ml Dexamethasone;Gait training;Canalith Repostioning;Vestibular    PT Next Visit Plan   hip and knee strengthening, core strengthening, patellar mobility, manual techniques, modalities PRN    Consulted and Agree with Plan of Care  Patient       Patient will benefit from skilled therapeutic intervention in order to improve the following deficits and impairments:  Pain, Improper body mechanics, Postural dysfunction, Dizziness, Decreased strength, Difficulty walking, Decreased balance  Visit Diagnosis: Muscle weakness (generalized)  Radiculopathy, lumbar region  Right knee pain, unspecified chronicity  Pain in right hip  History of falling     Problem List Patient Active Problem List   Diagnosis Date Noted  . Obese 06/20/2016  . Status post right knee replacement 06/19/2016  . S/P left TKA 06/18/2016  . Spinal stenosis of lumbar region 09/14/2015    MJoneen BoersPT, DPT   07/29/2019, 4:52 PM  [facility] [address] [place] [address]Phone: [phone]  Fax:  [phone] Name: RRHIANNA RAULERSONMRN: [id number] of Birth: 410-30-1952

## 2019-08-05 ENCOUNTER — Ambulatory Visit: Payer: Medicare Other

## 2019-08-12 ENCOUNTER — Ambulatory Visit: Payer: Medicare Other

## 2019-08-12 ENCOUNTER — Other Ambulatory Visit: Payer: Self-pay

## 2019-08-12 DIAGNOSIS — M6281 Muscle weakness (generalized): Secondary | ICD-10-CM

## 2019-08-12 DIAGNOSIS — M25561 Pain in right knee: Secondary | ICD-10-CM

## 2019-08-12 NOTE — Therapy (Signed)
[facility] [address] [place], [address] Phone: [phone]   Fax:  [phone]  Physical Therapy Treatment  Patient Details  Name: Destiny Shaw  MRN: [id number] Date of Birth: Mar 11, 1950 Referring Provider (PT): Paralee Cancel, MD   Encounter Date: 08/12/2019   PT End of Session - 08/12/19 1350    Visit Number 17    Number of Visits 110    Date for PT Re-Evaluation 09/17/19    Authorization Type 6    Authorization Time Period of 10 progress note Medicare    PT Start Time 1350    PT Stop Time 1431    PT Time Calculation (min) 41 min    Activity Tolerance Patient tolerated treatment well    Behavior During Therapy [facility] for tasks assessed/performed           Past Medical History:  Diagnosis Date  . ADD (attention deficit disorder)   . Anemia   . Anginal pain ([place])    pt states has occas chest pain relates to indigestion; pt uses rest to relieve   . Anxiety   . Arthritis   . Concussion   . Depression   . Diabetes mellitus without complication ([place])   . Dizziness   . Fall   . GERD (gastroesophageal reflux disease)   . Headache   . History of urinary tract infection   . Hyperlipidemia   . Hypertension   . Hypothyroidism   . IBS (irritable bowel syndrome)   . Imbalance   . Numbness    right leg   . Numbness in both hands    comes and goes   . Pneumonia    last episode approx 1 year ago   . Sleep apnea   . Wears glasses     Past Surgical History:  Procedure Laterality Date  . BREAST CYST ASPIRATION Left 1980's   neg  . BREAST CYST EXCISION Right 1980's   neg  . BREAST LUMPECTOMY Right   . BREAST SURGERY    . CARPAL TUNNEL RELEASE    . CESAREAN SECTION     times 2  . COLONOSCOPY WITH PROPOFOL N/A 05/11/2015   Procedure: COLONOSCOPY WITH PROPOFOL;  Surgeon: Manya Silvas, MD;  Location: [facility] ENDOSCOPY;  Service: Endoscopy;  Laterality: N/A;  . DE QUERVAIN'S RELEASE Right   .  ESOPHAGOGASTRODUODENOSCOPY (EGD) WITH PROPOFOL N/A 05/11/2015   Procedure: ESOPHAGOGASTRODUODENOSCOPY (EGD) WITH PROPOFOL;  Surgeon: Manya Silvas, MD;  Location: [facility] ENDOSCOPY;  Service: Endoscopy;  Laterality: N/A;  . EYE SURGERY     laser surgery bilat   . HERNIA REPAIR    . KNEE ARTHROSCOPY    . LUMBAR LAMINECTOMY/DECOMPRESSION MICRODISCECTOMY Bilateral 09/14/2015   Procedure: MICRO LUMBAR BILATERAL DECOMPRESSION L4 - L5;  Surgeon: Susa Day, MD;  Location: [facility] ORS;  Service: Orthopedics;  Laterality: Bilateral;  . REDUCTION MAMMAPLASTY Bilateral 1980  . RHINOPLASTY    . TONSILLECTOMY    . TOTAL KNEE ARTHROPLASTY Left 06/18/2016   Procedure: LEFT TOTAL KNEE ARTHROPLASTY;  Surgeon: Paralee Cancel, MD;  Location: [facility] ORS;  Service: Orthopedics;  Laterality: Left;  Adductor Block  . TUBAL LIGATION    . UVULOPALATOPHARYNGOPLASTY      There were no vitals filed for this visit.   Subjective Assessment - 08/12/19 1354    Subjective R knee is bothering her today. Did not notice her R hip, low back and L knee bothering her due to L knee pain.  9/10 L medial knee pain when walking.  Did not do anything different. Had been that way for the past 4-5 days.    Patient is accompained by: Family member    Pertinent History Low back pain. Pt states having back surgery about 2-3 years ago. After she had her L TKA, her back started aggravating her due to her walking. Pt also fell about 4 weeks ago onto her L knee. Dr. Alvan Dame checked out her L knee. L knee was fine but has some inflammation.  Pt tries to keep it iced. Pt still recovering for her L TKA but the fall set her back.   Her L knee surgery was last year.  L knee still bothers her a lot.  Had surgery for her back before her L knee surgery. Was doing well until her L knee surgery. The fall made it worse. Feels debilitating. Also has a hard time getting into and out of the car mainly due to her L knee.   Pt fell due to her L knee bucking on her. Pt was  trying to pick something up from the side of the couch.  Also has a hard time getting up from the floor.  Pt states having bladder accidents since after her fall (pt was recommended to tell her MD about it).  Bowel problems since taking medications (was constapated, but after taking medications, pt had diarrhea which was difficult to control. Getting out of the metformin fixed the bowel issue).  Pt states tingling and numbness L lateral LE along the L5 dermatome.  Denies saddle anesthesia.  Pt states that her back problem is mainly on her L side.  Pt landed on her L knee when she fell.  No other falls within the last 6 months. Pt also states being very dizzy since her L knee surgery.  Had PT treatement for her dizziness before which did not help.  The room feels like it is spinning when she gets dizzy.  I feel like it is getting better then she does something.  5/10 low back soreness currently (7/10 at most for the past 7 days after negotiating stairs, 5/10 without doing stairs). 4/10 L knee pain currenglty with gait 5/10 L knee pain at most for the past 7 days.    Patient Stated Goals Be better able to get into and out of her vehicle (midsized truck), into and out of chairs, be better able to roll in her bed with less L knee pain. Be able to get down on the floor and get back up.     Currently in Pain? Yes    Pain Score 9                                      PT Education - 08/12/19 1400    Education Details ther-ex    Person(s) Educated Patient    Methods Explanation;Demonstration;Tactile cues;Verbal cues    Comprehension Returned demonstration;Verbalized understanding          Objectives   MedbridgeAccess Code: [email or web address]  Latex free bands usedif used   Sitting posture: L trunk rotation  Standing posture: slightLlateral shift  Pt states that pressing her back against the chair feels better.  Gait observation: increased L lateral lean during L  LE stance phase.      Therapeutic exercise  seated R hip IR 10x5 seconds for 2 sets  Seated hip adduction  folded pillow squeeze 10x10 seconds for 2 sets  Supine hip IR stretch with PT assist   R 1 min x 3   hooklying position with one leg straight, glute max squeeze to promote ipsilateral hip flexor stretch and glute muscle activation R 10x5 seconds for 2 sets  Gait multiple times to assess effectiveness of treatment     Improved exercise technique, movement at target joints, use of target muscles after mod verbal, visual, tactile cues.   Manual therapy  Seated STM R lateral knee to decrease fascial restrictions Seated medial glide R patella grade 3 to improve mobility     Response to treatment Pt tolerated session well without aggravation of symptoms. Decreased R knee pain after session.     Clinical impression Worked on decreasing soft tissue restrictions R lateral knee, patella and hip as well as improving R glute muscle strength/activation to promote better R LE mechanics during gait. Decreased R knee pain after session reported by pt. Pt will benefit from continued skilled physical therapy services to decrease pain, improve strength and function.      PT Short Term Goals - 06/10/19 1849      PT SHORT TERM GOAL #1   Title Patient will be independent with her HEP to help decrease back and L knee pain and improve ability to perform functional tasks.     Time 3    Period Weeks    Status On-going    Target Date 07/02/19             PT Long Term Goals - 07/22/19 1439      PT LONG TERM GOAL #1   Title Patient will have a decrease in back pain to 3/10 or less at worst to promote ability to ambulate, turn in bed, perform standing tasks with less pain.     Baseline 6/10 back pain at most (09/24/2017); 7-8/10 at most for the past 7 days, duration of pain is better since starting PT (10/29/2017); 5-6/10 at most  for the past 7 days (11/18/2017); 8/10 at most for the past 7 days but the duration of pain is less compared to prior to starting PT (12/12/2017).  7/10 back pain at most for the past 7 days (pt states doing her exercises helps decrease her pain; 01/02/2018); walking (3/10), standing (4/10), bed mobility (0/10) 02/05/2018; 6/10 back pain at most for the past 7 days (03/03/2018); 6.5/10 back pain at most for the past 7 days (03/27/2018); 7-8/10 at most (05/08/2018); 7/10 back pain at worst (07/22/2018);  7-8/10 at most for the past 7 days (09/22/2018); 5-7/10 at worst for the past 7 days then calms down (10/27/2018); 6/10 back pain at most for the past 7 days (when pt cooks and cleans up in kitchen; 12/17/2018); 6-7/10 back spams pain at most for the past 7 days, usually when she is doing stuff in the kitchen (01/29/2019); 7/10 at worst for the past 7 days (03/25/2019); 7.5/10 (05/20/19); 8/10 low back pain at most for the past 7 days (06/10/2019); 7/10 at worst; Easier to decrease back pain since starting PT (07/22/2019)    Time 8    Period Weeks    Status On-going    Target Date 09/17/19      PT LONG TERM GOAL #2   Title Patient will have a decrease in L knee pain to 3/10 or less at worst to promote ability to ambulate, negotiate stairs, get into and out of a car more comfortably.  Baseline 6/10 L knee pain at most for the past 3 months (09/24/2017); 7-8/10 at most for the past 7 days, duration of pain is better since starting PT (10/29/2017); 7/10 at most for the past 7 days (11/18/2017); 6/10 L knee pain at most for the past 7 days (12/12/2017); 5/10 L knee pain at most for the past 7 days (pt states doing her exercises helps decrease pain; 01/02/2018); L knee 3/10 (02/05/2018); 4/10 at most for the past 7 days (03/03/2018); 5.5/10 L knee at most for the past 7 days (03/27/2018); 6-7/10 L knee pain at most (05/08/2018); 7/10 L knee pain at worst for the past month (07/22/2018);  6/10 L knee pain at most for the past 7 days  (09/22/2018); 5-6/10 at most for the past 7 days sporadically, can get it better with exercise (10/27/2018); 6/10 L knee ache at most for the past 7 days (12/17/2018); 5-6/10 L knee pain at most for the past 7 days such as going up and down steps (01/29/2019); 5/10 at most for the past 7 days (03/25/2019); 5.5/10 (05/20/19); 4/10 (06/10/2019); 4/10 (07/22/2019)    Time 8    Period Weeks    Status Partially Met    Target Date 09/17/19      PT LONG TERM GOAL #3   Title Patient will improve TUG time to 12 seconds or less as a demonstration of improved functional mobility and balance.     Baseline TUG no AD: 16.05 seconds on average (09/24/2017); 14.3 seconds on average (10/29/2017); 13 seconds average (11/21/2017); 16.67 seconds average (12/12/2017);  14.17 seconds average (01/02/2018); 11.8 sec sonds (02/05/2018)    Time 6    Period Weeks    Status Achieved      PT LONG TERM GOAL #4   Title Patient will improve her back FOTO score by at least 10 points as a demonstration of improved funtion.      Baseline Back FOTO: 33 (09/24/2017); 38 (10/29/2017); 36 (11/21/2017); 40 (12/12/2017); 42 (01/02/2018); 47 (03/03/2018)    Time 6    Period Weeks    Status Achieved      PT LONG TERM GOAL #5   Title Patient will improve bilateral LE strength by at least 1/2 MMT grade to promote ability to perform standing tasks.     Time 6    Period Weeks    Status Achieved      PT LONG TERM GOAL #6   Title Patient will improve her Modified Oswestry Low Back pain disablity questionnaire by at least 10% as a demonstration of improved function.     Baseline 48% (09/24/2017); 46% (10/29/2017), (11/21/2017); 56% (12/12/2017); 36% (01/02/2018), (03/03/2018)    Time 6    Period Weeks    Status Achieved      PT LONG TERM GOAL #7   Title Pt will report decreased difficulty stepping into and out of her bathtub as well as getting into and out of her truck to promote ability to get into and out of places.     Baseline Pt reports difficulty  stepping into and out of her bathtub as well as difficulty getting into and out of her truck (01/02/2018); Pt states the truck and bath tub and difficult and painful.  (02/05/2018); Improving per subjective reports (03/03/2018); decreased difficulty with truck tranfser after cues for technique and practice, decreasing difficulty with stepping into and out of the bath tub based on subjective reports (03/27/2018 and 04/01/2018); Decreasing difficulty stepping into a bathtub. No difficulty getting  into and out of her truck (05/08/2018), (07/22/2018); Difficulty stepping into and out of the bath tub. No difficulty with truck transfers (09/22/2018); Able to get into her bath tub, difficulty getting out (10/27/18); Still has difficulty getting into and out of bathtub (02/23/2019); Better able to step into the bathtub. Still has difficulty stepping out of the bathtub due to knee flexion (03/25/2019); No difficulty stepping into her bath tub. Still has difficulty stepping out due to decreased L knee flexion ROM (05/20/2019), (06/10/2019), (07/22/2019)    Time 8    Period Weeks    Status Partially Met    Target Date 09/17/19      PT LONG TERM GOAL #8   Title Patient will improve her BERG balance test score to 46/56 or more as a demonstration of decreased fall risk     Baseline 42/56 (01/02/2018); 54/56 (02/05/2018)    Time 6    Period Weeks    Status Achieved      PT LONG TERM GOAL  #9   TITLE Pt will report minimal to no dizziness related to vertigo to promote balance and decrease fall risk.     Baseline Pt reports dizziness (related to vertigo) which seem to affect her balance (01/15/2018); Patient reports "a little bit every now and then" (02/05/2018); Pt reports dizziness and decreased balance (01/29/2019); Dizziness has improved some (02/23/2019), (03/25/2019); Still has dizziness and vertigo; does the [place] for both sides instead of just the L side (06/10/2019)    Time 8    Period Weeks    Status On-going    Target  Date 09/17/19      PT LONG TERM GOAL  #10   TITLE Pt will be able to turn 360 degrees to the R and L at least 3 times without dizziness and no LOB to promote safety and decrease fall risk.    Baseline 360 degree turn to the R and L causes a loss of balance (12/17/2018), (01/29/2019). able to perform 3 times with each side with improved balance, minimal dizziness. (02/23/2019); Dizziness (05/20/19), slight dizziness (06/10/2019), (07/22/2019)    Time 8    Period Weeks    Status On-going    Target Date 09/17/19      PT LONG TERM GOAL  #11   TITLE Patient will have a decrease in R hip and knee pain to 4/10 or less at worst to promote ability to ambulate with less discomfort.    Baseline 7/10 R hip and knee pain at worst for the past month (03/25/2019); 5/10 R hip pain, 6/10 R knee pain at most for the past 7 days (05/20/19); 5.5/10 R knee pain (05/20/19); 8/10 R hip; 6/10 R knee at most for the past 7 days (06/10/2019); 7/10, easier to decrease pain since starting PT (07/22/2019)    Time 8    Period Weeks    Status On-going    Target Date 09/17/19                 Plan - 08/12/19 1429    Clinical Impression Statement Worked on decreasing soft tissue restrictions R lateral knee, patella and hip as well as improving R glute muscle strength/activation to promote better R LE mechanics during gait. Decreased R knee pain after session reported by pt. Pt will benefit from continued skilled physical therapy services to decrease pain, improve strength and function.    Personal Factors and Comorbidities Age;Comorbidity 3+;Time since onset of injury/illness/exacerbation;Fitness;Past/Current Experience    Comorbidities Back, knee pain,  hx of dizziness, hx of back surgery, hx of C-section; memory lapse    Examination-Activity Limitations Squat    Stability/Clinical Decision Making Stable/Uncomplicated    Rehab Potential Fair    Clinical Impairments Affecting Rehab Potential difficulty with carry-over of  improvements in clinic, chronicity of condition, age, multiple problem areas; MEMORY lapse    PT Frequency 1x / week    PT Duration Other (comment)   5 weeks   PT Treatment/Interventions Therapeutic activities;Therapeutic exercise;Balance training;Neuromuscular re-education;Patient/family education;Manual techniques;Dry needling;Aquatic Therapy;Electrical Stimulation;Iontophoresis 5m/ml Dexamethasone;Gait training;Canalith Repostioning;Vestibular    PT Next Visit Plan  hip and knee strengthening, core strengthening, patellar mobility, manual techniques, modalities PRN    Consulted and Agree with Plan of Care Patient           Patient will benefit from skilled therapeutic intervention in order to improve the following deficits and impairments:  Pain, Improper body mechanics, Postural dysfunction, Dizziness, Decreased strength, Difficulty walking, Decreased balance  Visit Diagnosis: Muscle weakness (generalized)  Right knee pain, unspecified chronicity     Problem List Patient Active Problem List   Diagnosis Date Noted  . Obese 06/20/2016  . Status post right knee replacement 06/19/2016  . S/P left TKA 06/18/2016  . Spinal stenosis of lumbar region 09/14/2015    MJoneen BoersPT, DPT   08/12/2019, 7:07 PM  [facility] [address] [place] [address]Phone: [phone]  Fax:  [phone] Name: RKAMERIA CANIZARESMRN: [id number] of Birth: 405-23-1952

## 2019-08-19 ENCOUNTER — Ambulatory Visit: Payer: Medicare Other

## 2019-08-26 ENCOUNTER — Other Ambulatory Visit: Payer: Self-pay

## 2019-08-26 ENCOUNTER — Ambulatory Visit: Payer: Medicare Other

## 2019-08-26 DIAGNOSIS — M6281 Muscle weakness (generalized): Secondary | ICD-10-CM

## 2019-08-26 DIAGNOSIS — M25551 Pain in right hip: Secondary | ICD-10-CM

## 2019-08-26 DIAGNOSIS — M25561 Pain in right knee: Secondary | ICD-10-CM

## 2019-08-26 DIAGNOSIS — Z9181 History of falling: Secondary | ICD-10-CM

## 2019-08-26 DIAGNOSIS — M5416 Radiculopathy, lumbar region: Secondary | ICD-10-CM

## 2019-08-26 NOTE — Therapy (Signed)
[facility] [address], [place], [address] Phone: [phone]   Fax:  [phone]  Physical Therapy Treatment  Patient Details  Name: Destiny Shaw MRN: [id number] Date of Birth: 23-Oct-1950 Referring Provider (PT): Paralee Cancel, MD   Encounter Date: 08/26/2019   PT End of Session - 08/26/19 1303    Visit Number 28    Number of Visits 110    Date for PT Re-Evaluation 09/17/19    Authorization Type 7    Authorization Time Period of 10 progress note Medicare    PT Start Time 1303    PT Stop Time 1344    PT Time Calculation (min) 41 min    Activity Tolerance Patient tolerated treatment well    Behavior During Therapy WFL for tasks assessed/performed           Past Medical History:  Diagnosis Date   ADD (attention deficit disorder)    Anemia    Anginal pain ([place])    pt states has occas chest pain relates to indigestion; pt uses rest to relieve    Anxiety    Arthritis    Concussion    Depression    Diabetes mellitus without complication (HCC)    Dizziness    Fall    GERD (gastroesophageal reflux disease)    Headache    History of urinary tract infection    Hyperlipidemia    Hypertension    Hypothyroidism    IBS (irritable bowel syndrome)    Imbalance    Numbness    right leg    Numbness in both hands    comes and goes    Pneumonia    last episode approx 1 year ago    Sleep apnea    Wears glasses     Past Surgical History:  Procedure Laterality Date   BREAST CYST ASPIRATION Left 1980's   neg   BREAST CYST EXCISION Right 1980's   neg   BREAST LUMPECTOMY Right    BREAST SURGERY     CARPAL TUNNEL RELEASE     CESAREAN SECTION     times 2   COLONOSCOPY WITH PROPOFOL N/A 05/11/2015   Procedure: COLONOSCOPY WITH PROPOFOL;  Surgeon: Manya Silvas, MD;  Location: [facility] ENDOSCOPY;  Service: Endoscopy;  Laterality: N/A;   DE QUERVAIN'S RELEASE Right     ESOPHAGOGASTRODUODENOSCOPY (EGD) WITH PROPOFOL N/A 05/11/2015   Procedure: ESOPHAGOGASTRODUODENOSCOPY (EGD) WITH PROPOFOL;  Surgeon: Manya Silvas, MD;  Location: [facility] ENDOSCOPY;  Service: Endoscopy;  Laterality: N/A;   EYE SURGERY     laser surgery bilat    HERNIA REPAIR     KNEE ARTHROSCOPY     LUMBAR LAMINECTOMY/DECOMPRESSION MICRODISCECTOMY Bilateral 09/14/2015   Procedure: MICRO LUMBAR BILATERAL DECOMPRESSION L4 - L5;  Surgeon: Susa Day, MD;  Location: [facility] ORS;  Service: Orthopedics;  Laterality: Bilateral;   REDUCTION MAMMAPLASTY Bilateral 1980   RHINOPLASTY     TONSILLECTOMY     TOTAL KNEE ARTHROPLASTY Left 06/18/2016   Procedure: LEFT TOTAL KNEE ARTHROPLASTY;  Surgeon: Paralee Cancel, MD;  Location: [facility] ORS;  Service: Orthopedics;  Laterality: Left;  Adductor Block   TUBAL LIGATION     UVULOPALATOPHARYNGOPLASTY      There were no vitals filed for this visit.   Subjective Assessment - 08/26/19 1305    Subjective Husband had a stent put in in his heart. Today is not bad comparatively.    Patient is accompained by: Family member  Pertinent History Low back pain. Pt states having back surgery about 2-3 years ago. After she had her L TKA, her back started aggravating her due to her walking. Pt also fell about 4 weeks ago onto her L knee. Dr. Alvan Dame checked out her L knee. L knee was fine but has some inflammation.  Pt tries to keep it iced. Pt still recovering for her L TKA but the fall set her back.   Her L knee surgery was last year.  L knee still bothers her a lot.  Had surgery for her back before her L knee surgery. Was doing well until her L knee surgery. The fall made it worse. Feels debilitating. Also has a hard time getting into and out of the car mainly due to her L knee.   Pt fell due to her L knee bucking on her. Pt was trying to pick something up from the side of the couch.  Also has a hard time getting up from the floor.  Pt states having bladder accidents since after  her fall (pt was recommended to tell her MD about it).  Bowel problems since taking medications (was constapated, but after taking medications, pt had diarrhea which was difficult to control. Getting out of the metformin fixed the bowel issue).  Pt states tingling and numbness L lateral LE along the L5 dermatome.  Denies saddle anesthesia.  Pt states that her back problem is mainly on her L side.  Pt landed on her L knee when she fell.  No other falls within the last 6 months. Pt also states being very dizzy since her L knee surgery.  Had PT treatement for her dizziness before which did not help.  The room feels like it is spinning when she gets dizzy.  I feel like it is getting better then she does something.  5/10 low back soreness currently (7/10 at most for the past 7 days after negotiating stairs, 5/10 without doing stairs). 4/10 L knee pain currenglty with gait 5/10 L knee pain at most for the past 7 days.    Patient Stated Goals Be better able to get into and out of her vehicle (midsized truck), into and out of chairs, be better able to roll in her bed with less L knee pain. Be able to get down on the floor and get back up.     Currently in Pain? Other (Comment)   no mention of pain levels                                    PT Education - 08/26/19 1311    Education Details ther-ex    Person(s) Educated Patient    Methods Explanation;Demonstration;Tactile cues;Verbal cues    Comprehension Returned demonstration;Verbalized understanding          Objectives   MedbridgeAccess Code: [email or web address]  Latex free bands usedif used   Sitting posture: L trunk rotation  Standing posture: slightLlateral shift  Pt states that pressing her back against the chair feels better.  Gait observation: increased L lateral lean during L LE stance phase.      Therapeutic exercise   Sit <> stand with emphasis on even weight distribution of feet  and not just heels for more steadiness from chair with arms, B UE assist 5x, then 3x  Pt weight on heels observed initially causing unsteadiness  Seated hip adduction ball and glute max squeeze  10x10 seconds  Then with R knee extension   Sit <> stand from regular chair after manuatl therapy 3x. Decreased R paterallar pain with stand to sit after manual therapy  Side stepping yellow band around thighs 25 ft to the R and 25 ft to the L   Static mini lunges with one UE assist to promote glute max strengthening  R 5x2  L 5x2      Improved exercise technique, movement at target joints, use of target muscles after mod verbal, visual, tactile cues.   Manual therapy  Seated medial glide R patella grade 3 to improve mobility Seated STM R lateral knee to decrease fascial restrictions  Decreased R patellar pain      Response to treatment Pt tolerated session well without aggravation of symptoms. Decreased R knee pain after session.    Clinical impression Improved steadiness with sit <> stand transfers when pt cued to place her body weight throughout feet instead of just her heels. Demonstrates R patellar pain with stand to sit, eases with cues to bring knees behind toes as well as with treatment to promote medial patellar glide and decrease R lateral knee soft tissue restrictions. Worked on glute med, max and quad strengthening to promote proper knee, hip, and lumbar mechanics with standing tasks such as gait. Pt states R knee feels less stiff after session. Pt tolerated session well without aggravation of symptoms. Pt will benefit from continued skilled physical therapy services to improve strength, balance, and function.        PT Short Term Goals - 06/10/19 1849      PT SHORT TERM GOAL #1   Title Patient will be independent with her HEP to help decrease back and L knee pain and improve ability to perform functional tasks.     Time 3    Period Weeks    Status  On-going    Target Date 07/02/19             PT Long Term Goals - 07/22/19 1439      PT LONG TERM GOAL #1   Title Patient will have a decrease in back pain to 3/10 or less at worst to promote ability to ambulate, turn in bed, perform standing tasks with less pain.     Baseline 6/10 back pain at most (09/24/2017); 7-8/10 at most for the past 7 days, duration of pain is better since starting PT (10/29/2017); 5-6/10 at most for the past 7 days (11/18/2017); 8/10 at most for the past 7 days but the duration of pain is less compared to prior to starting PT (12/12/2017).  7/10 back pain at most for the past 7 days (pt states doing her exercises helps decrease her pain; 01/02/2018); walking (3/10), standing (4/10), bed mobility (0/10) 02/05/2018; 6/10 back pain at most for the past 7 days (03/03/2018); 6.5/10 back pain at most for the past 7 days (03/27/2018); 7-8/10 at most (05/08/2018); 7/10 back pain at worst (07/22/2018);  7-8/10 at most for the past 7 days (09/22/2018); 5-7/10 at worst for the past 7 days then calms down (10/27/2018); 6/10 back pain at most for the past 7 days (when pt cooks and cleans up in kitchen; 12/17/2018); 6-7/10 back spams pain at most for the past 7 days, usually when she is doing stuff in the kitchen (01/29/2019); 7/10 at worst for the past 7 days (03/25/2019); 7.5/10 (05/20/19); 8/10 low back pain at most for the past 7 days (06/10/2019); 7/10 at worst; Easier to decrease  back pain since starting PT (07/22/2019)    Time 8    Period Weeks    Status On-going    Target Date 09/17/19      PT LONG TERM GOAL #2   Title Patient will have a decrease in L knee pain to 3/10 or less at worst to promote ability to ambulate, negotiate stairs, get into and out of a car more comfortably.     Baseline 6/10 L knee pain at most for the past 3 months (09/24/2017); 7-8/10 at most for the past 7 days, duration of pain is better since starting PT (10/29/2017); 7/10 at most for the past 7 days (11/18/2017); 6/10 L  knee pain at most for the past 7 days (12/12/2017); 5/10 L knee pain at most for the past 7 days (pt states doing her exercises helps decrease pain; 01/02/2018); L knee 3/10 (02/05/2018); 4/10 at most for the past 7 days (03/03/2018); 5.5/10 L knee at most for the past 7 days (03/27/2018); 6-7/10 L knee pain at most (05/08/2018); 7/10 L knee pain at worst for the past month (07/22/2018);  6/10 L knee pain at most for the past 7 days (09/22/2018); 5-6/10 at most for the past 7 days sporadically, can get it better with exercise (10/27/2018); 6/10 L knee ache at most for the past 7 days (12/17/2018); 5-6/10 L knee pain at most for the past 7 days such as going up and down steps (01/29/2019); 5/10 at most for the past 7 days (03/25/2019); 5.5/10 (05/20/19); 4/10 (06/10/2019); 4/10 (07/22/2019)    Time 8    Period Weeks    Status Partially Met    Target Date 09/17/19      PT LONG TERM GOAL #3   Title Patient will improve TUG time to 12 seconds or less as a demonstration of improved functional mobility and balance.     Baseline TUG no AD: 16.05 seconds on average (09/24/2017); 14.3 seconds on average (10/29/2017); 13 seconds average (11/21/2017); 16.67 seconds average (12/12/2017);  14.17 seconds average (01/02/2018); 11.8 sec sonds (02/05/2018)    Time 6    Period Weeks    Status Achieved      PT LONG TERM GOAL #4   Title Patient will improve her back FOTO score by at least 10 points as a demonstration of improved funtion.      Baseline Back FOTO: 33 (09/24/2017); 38 (10/29/2017); 36 (11/21/2017); 40 (12/12/2017); 42 (01/02/2018); 47 (03/03/2018)    Time 6    Period Weeks    Status Achieved      PT LONG TERM GOAL #5   Title Patient will improve bilateral LE strength by at least 1/2 MMT grade to promote ability to perform standing tasks.     Time 6    Period Weeks    Status Achieved      PT LONG TERM GOAL #6   Title Patient will improve her Modified Oswestry Low Back pain disablity questionnaire by at least 10% as a  demonstration of improved function.     Baseline 48% (09/24/2017); 46% (10/29/2017), (11/21/2017); 56% (12/12/2017); 36% (01/02/2018), (03/03/2018)    Time 6    Period Weeks    Status Achieved      PT LONG TERM GOAL #7   Title Pt will report decreased difficulty stepping into and out of her bathtub as well as getting into and out of her truck to promote ability to get into and out of places.     Baseline Pt reports difficulty  stepping into and out of her bathtub as well as difficulty getting into and out of her truck (01/02/2018); Pt states the truck and bath tub and difficult and painful.  (02/05/2018); Improving per subjective reports (03/03/2018); decreased difficulty with truck tranfser after cues for technique and practice, decreasing difficulty with stepping into and out of the bath tub based on subjective reports (03/27/2018 and 04/01/2018); Decreasing difficulty stepping into a bathtub. No difficulty getting into and out of her truck (05/08/2018), (07/22/2018); Difficulty stepping into and out of the bath tub. No difficulty with truck transfers (09/22/2018); Able to get into her bath tub, difficulty getting out (10/27/18); Still has difficulty getting into and out of bathtub (02/23/2019); Better able to step into the bathtub. Still has difficulty stepping out of the bathtub due to knee flexion (03/25/2019); No difficulty stepping into her bath tub. Still has difficulty stepping out due to decreased L knee flexion ROM (05/20/2019), (06/10/2019), (07/22/2019)    Time 8    Period Weeks    Status Partially Met    Target Date 09/17/19      PT LONG TERM GOAL #8   Title Patient will improve her BERG balance test score to 46/56 or more as a demonstration of decreased fall risk     Baseline 42/56 (01/02/2018); 54/56 (02/05/2018)    Time 6    Period Weeks    Status Achieved      PT LONG TERM GOAL  #9   TITLE Pt will report minimal to no dizziness related to vertigo to promote balance and decrease fall risk.      Baseline Pt reports dizziness (related to vertigo) which seem to affect her balance (01/15/2018); Patient reports "a little bit every now and then" (02/05/2018); Pt reports dizziness and decreased balance (01/29/2019); Dizziness has improved some (02/23/2019), (03/25/2019); Still has dizziness and vertigo; does the [place] for both sides instead of just the L side (06/10/2019)    Time 8    Period Weeks    Status On-going    Target Date 09/17/19      PT LONG TERM GOAL  #10   TITLE Pt will be able to turn 360 degrees to the R and L at least 3 times without dizziness and no LOB to promote safety and decrease fall risk.    Baseline 360 degree turn to the R and L causes a loss of balance (12/17/2018), (01/29/2019). able to perform 3 times with each side with improved balance, minimal dizziness. (02/23/2019); Dizziness (05/20/19), slight dizziness (06/10/2019), (07/22/2019)    Time 8    Period Weeks    Status On-going    Target Date 09/17/19      PT LONG TERM GOAL  #11   TITLE Patient will have a decrease in R hip and knee pain to 4/10 or less at worst to promote ability to ambulate with less discomfort.    Baseline 7/10 R hip and knee pain at worst for the past month (03/25/2019); 5/10 R hip pain, 6/10 R knee pain at most for the past 7 days (05/20/19); 5.5/10 R knee pain (05/20/19); 8/10 R hip; 6/10 R knee at most for the past 7 days (06/10/2019); 7/10, easier to decrease pain since starting PT (07/22/2019)    Time 8    Period Weeks    Status On-going    Target Date 09/17/19                 Plan - 08/26/19 1311    Clinical Impression  Statement Improved steadiness with sit <> stand transfers when pt cued to place her body weight throughout feet instead of just her heels. Demonstrates R patellar pain with stand to sit, eases with cues to bring knees behind toes as well as with treatment to promote medial patellar glide and decrease R lateral knee soft tissue restrictions. Worked on glute med, max and  quad strengthening to promote proper knee, hip, and lumbar mechanics with standing tasks such as gait. Pt states R knee feels less stiff after session. Pt tolerated session well without aggravation of symptoms. Pt will benefit from continued skilled physical therapy services to improve strength, balance, and function.    Personal Factors and Comorbidities Age;Comorbidity 3+;Time since onset of injury/illness/exacerbation;Fitness;Past/Current Experience    Comorbidities Back, knee pain, hx of dizziness, hx of back surgery, hx of C-section; memory lapse    Examination-Activity Limitations Squat    Stability/Clinical Decision Making Stable/Uncomplicated    Rehab Potential Fair    Clinical Impairments Affecting Rehab Potential difficulty with carry-over of improvements in clinic, chronicity of condition, age, multiple problem areas; MEMORY lapse    PT Frequency 1x / week    PT Duration Other (comment)   5 weeks   PT Treatment/Interventions Therapeutic activities;Therapeutic exercise;Balance training;Neuromuscular re-education;Patient/family education;Manual techniques;Dry needling;Aquatic Therapy;Electrical Stimulation;Iontophoresis 17m/ml Dexamethasone;Gait training;Canalith Repostioning;Vestibular    PT Next Visit Plan  hip and knee strengthening, core strengthening, patellar mobility, manual techniques, modalities PRN    Consulted and Agree with Plan of Care Patient           Patient will benefit from skilled therapeutic intervention in order to improve the following deficits and impairments:  Pain, Improper body mechanics, Postural dysfunction, Dizziness, Decreased strength, Difficulty walking, Decreased balance  Visit Diagnosis: Muscle weakness (generalized)  Right knee pain, unspecified chronicity  Radiculopathy, lumbar region  Pain in right hip  History of falling     Problem List Patient Active Problem List   Diagnosis Date Noted   Obese 06/20/2016   Status post right knee  replacement 06/19/2016   S/P left TKA 06/18/2016   Spinal stenosis of lumbar region 09/14/2015   MJoneen BoersPT, DPT   08/26/2019, 1:57 PM  [facility] [address] [place] [address]Phone: [phone]  Fax:  [phone] Name: RWILHELMINE KROGSTADMRN: [id number] of Birth: 402-24-1952

## 2019-09-02 ENCOUNTER — Ambulatory Visit: Payer: Medicare Other | Attending: Orthopedic Surgery

## 2019-09-02 ENCOUNTER — Other Ambulatory Visit: Payer: Self-pay

## 2019-09-02 DIAGNOSIS — Z9181 History of falling: Secondary | ICD-10-CM | POA: Insufficient documentation

## 2019-09-02 DIAGNOSIS — M25561 Pain in right knee: Secondary | ICD-10-CM | POA: Insufficient documentation

## 2019-09-02 DIAGNOSIS — M6281 Muscle weakness (generalized): Secondary | ICD-10-CM | POA: Diagnosis present

## 2019-09-02 DIAGNOSIS — M5416 Radiculopathy, lumbar region: Secondary | ICD-10-CM

## 2019-09-02 DIAGNOSIS — R42 Dizziness and giddiness: Secondary | ICD-10-CM | POA: Diagnosis present

## 2019-09-02 DIAGNOSIS — M25551 Pain in right hip: Secondary | ICD-10-CM | POA: Insufficient documentation

## 2019-09-02 DIAGNOSIS — G8929 Other chronic pain: Secondary | ICD-10-CM | POA: Diagnosis present

## 2019-09-02 DIAGNOSIS — M25562 Pain in left knee: Secondary | ICD-10-CM | POA: Insufficient documentation

## 2019-09-02 NOTE — Therapy (Signed)
[facility] [address] [place], [address] Phone: [phone]   Fax:  [phone]  Physical Therapy Treatment  Patient Details  Name: Destiny Shaw MRN: [id number] Date of Birth: 1950/11/04 Referring Provider (PT): Paralee Cancel, MD   Encounter Date: 09/02/2019   PT End of Session - 09/02/19 1302    Visit Number 65    Number of Visits 110    Date for PT Re-Evaluation 09/17/19    Authorization Type 8    Authorization Time Period of 10 progress note Medicare    PT Start Time 1302    PT Stop Time 1345    PT Time Calculation (min) 43 min    Activity Tolerance Patient tolerated treatment well    Behavior During Therapy [facility] for tasks assessed/performed           Past Medical History:  Diagnosis Date  . ADD (attention deficit disorder)   . Anemia   . Anginal pain ([place])    pt states has occas chest pain relates to indigestion; pt uses rest to relieve   . Anxiety   . Arthritis   . Concussion   . Depression   . Diabetes mellitus without complication ([place])   . Dizziness   . Fall   . GERD (gastroesophageal reflux disease)   . Headache   . History of urinary tract infection   . Hyperlipidemia   . Hypertension   . Hypothyroidism   . IBS (irritable bowel syndrome)   . Imbalance   . Numbness    right leg   . Numbness in both hands    comes and goes   . Pneumonia    last episode approx 1 year ago   . Sleep apnea   . Wears glasses     Past Surgical History:  Procedure Laterality Date  . BREAST CYST ASPIRATION Left 1980's   neg  . BREAST CYST EXCISION Right 1980's   neg  . BREAST LUMPECTOMY Right   . BREAST SURGERY    . CARPAL TUNNEL RELEASE    . CESAREAN SECTION     times 2  . COLONOSCOPY WITH PROPOFOL N/A 05/11/2015   Procedure: COLONOSCOPY WITH PROPOFOL;  Surgeon: Manya Silvas, MD;  Location: [facility] ENDOSCOPY;  Service: Endoscopy;  Laterality: N/A;  . DE QUERVAIN'S RELEASE Right   .  ESOPHAGOGASTRODUODENOSCOPY (EGD) WITH PROPOFOL N/A 05/11/2015   Procedure: ESOPHAGOGASTRODUODENOSCOPY (EGD) WITH PROPOFOL;  Surgeon: Manya Silvas, MD;  Location: [facility] ENDOSCOPY;  Service: Endoscopy;  Laterality: N/A;  . EYE SURGERY     laser surgery bilat   . HERNIA REPAIR    . KNEE ARTHROSCOPY    . LUMBAR LAMINECTOMY/DECOMPRESSION MICRODISCECTOMY Bilateral 09/14/2015   Procedure: MICRO LUMBAR BILATERAL DECOMPRESSION L4 - L5;  Surgeon: Susa Day, MD;  Location: [facility] ORS;  Service: Orthopedics;  Laterality: Bilateral;  . REDUCTION MAMMAPLASTY Bilateral 1980  . RHINOPLASTY    . TONSILLECTOMY    . TOTAL KNEE ARTHROPLASTY Left 06/18/2016   Procedure: LEFT TOTAL KNEE ARTHROPLASTY;  Surgeon: Paralee Cancel, MD;  Location: [facility] ORS;  Service: Orthopedics;  Laterality: Left;  Adductor Block  . TUBAL LIGATION    . UVULOPALATOPHARYNGOPLASTY      There were no vitals filed for this visit.   Subjective Assessment - 09/02/19 1304    Subjective Last sessino helped her get up and down without falling. Likes it when PT helps her when she gets to a tough spot physically and  gets her back on track.    Patient is accompained by: Family member    Pertinent History Low back pain. Pt states having back surgery about 2-3 years ago. After she had her L TKA, her back started aggravating her due to her walking. Pt also fell about 4 weeks ago onto her L knee. Dr. Alvan Dame checked out her L knee. L knee was fine but has some inflammation.  Pt tries to keep it iced. Pt still recovering for her L TKA but the fall set her back.   Her L knee surgery was last year.  L knee still bothers her a lot.  Had surgery for her back before her L knee surgery. Was doing well until her L knee surgery. The fall made it worse. Feels debilitating. Also has a hard time getting into and out of the car mainly due to her L knee.   Pt fell due to her L knee bucking on her. Pt was trying to pick something up from the side of the couch.  Also has a hard  time getting up from the floor.  Pt states having bladder accidents since after her fall (pt was recommended to tell her MD about it).  Bowel problems since taking medications (was constapated, but after taking medications, pt had diarrhea which was difficult to control. Getting out of the metformin fixed the bowel issue).  Pt states tingling and numbness L lateral LE along the L5 dermatome.  Denies saddle anesthesia.  Pt states that her back problem is mainly on her L side.  Pt landed on her L knee when she fell.  No other falls within the last 6 months. Pt also states being very dizzy since her L knee surgery.  Had PT treatement for her dizziness before which did not help.  The room feels like it is spinning when she gets dizzy.  I feel like it is getting better then she does something.  5/10 low back soreness currently (7/10 at most for the past 7 days after negotiating stairs, 5/10 without doing stairs). 4/10 L knee pain currenglty with gait 5/10 L knee pain at most for the past 7 days.    Patient Stated Goals Be better able to get into and out of her vehicle (midsized truck), into and out of chairs, be better able to roll in her bed with less L knee pain. Be able to get down on the floor and get back up.     Currently in Pain? No/denies                                     PT Education - 09/02/19 1311    Education Details ther-ex    Person(s) Educated Patient    Methods Explanation;Demonstration;Tactile cues;Verbal cues    Comprehension Returned demonstration;Verbalized understanding          Objectives   MedbridgeAccess Code: [email or web address]  Latex free bands usedif used   Sitting posture: L trunk rotation  Standing posture: slightLlateral shift  Pt states that pressing her back against the chair feels better.  Gait observation: increased L lateral lean during L LE stance phase.      Therapeutic exercise  S/L hip abduction   R  10x3  L 10x3   Supine posterior plevic tilt 10x10 seconds for 2 sets  Sit <> stand from low mat table with emphasis on forward weight shifting 5x  Standing forward weight shifting, semi staggered stance to improve ability to transfer center of gravity onto base of support.   R 10x 2  L 10x 2  Side stepping 32 ft to the R and 32 ft to the L for 2 sets  Forward wedding march 32 ft x 2    Improved exercise technique, movement at target joints, use of target muscles after mod verbal, visual, tactile cues.    Response to treatment Pt tolerated session well without aggravation of symptoms. Decreased R knee pain after session.    Clinical impression Pt returns to clinic with minimal to no complains of pain with reports of improved ability to perform sit to stand transfers more steadily. Continued working on glute med and core strength to decrease stress to low back and B knees when ambulating and performing standing tasks. Also worked on weight shifting to transfer center of gravity onto base of support promote balance and decrease fall risk.  Pt tolerated session well without aggravation of symptoms. Pt will benefit from continued skilled physical therapy services to decrease pain, improve strength and function.       PT Short Term Goals - 06/10/19 1849      PT SHORT TERM GOAL #1   Title Patient will be independent with her HEP to help decrease back and L knee pain and improve ability to perform functional tasks.     Time 3    Period Weeks    Status On-going    Target Date 07/02/19             PT Long Term Goals - 07/22/19 1439      PT LONG TERM GOAL #1   Title Patient will have a decrease in back pain to 3/10 or less at worst to promote ability to ambulate, turn in bed, perform standing tasks with less pain.     Baseline 6/10 back pain at most (09/24/2017); 7-8/10 at most for the past 7 days, duration of pain is better since starting PT (10/29/2017); 5-6/10 at  most for the past 7 days (11/18/2017); 8/10 at most for the past 7 days but the duration of pain is less compared to prior to starting PT (12/12/2017).  7/10 back pain at most for the past 7 days (pt states doing her exercises helps decrease her pain; 01/02/2018); walking (3/10), standing (4/10), bed mobility (0/10) 02/05/2018; 6/10 back pain at most for the past 7 days (03/03/2018); 6.5/10 back pain at most for the past 7 days (03/27/2018); 7-8/10 at most (05/08/2018); 7/10 back pain at worst (07/22/2018);  7-8/10 at most for the past 7 days (09/22/2018); 5-7/10 at worst for the past 7 days then calms down (10/27/2018); 6/10 back pain at most for the past 7 days (when pt cooks and cleans up in kitchen; 12/17/2018); 6-7/10 back spams pain at most for the past 7 days, usually when she is doing stuff in the kitchen (01/29/2019); 7/10 at worst for the past 7 days (03/25/2019); 7.5/10 (05/20/19); 8/10 low back pain at most for the past 7 days (06/10/2019); 7/10 at worst; Easier to decrease back pain since starting PT (07/22/2019)    Time 8    Period Weeks    Status On-going    Target Date 09/17/19      PT LONG TERM GOAL #2   Title Patient will have a decrease in L knee pain to 3/10 or less at worst to promote ability to ambulate, negotiate stairs, get into and out of a  car more comfortably.     Baseline 6/10 L knee pain at most for the past 3 months (09/24/2017); 7-8/10 at most for the past 7 days, duration of pain is better since starting PT (10/29/2017); 7/10 at most for the past 7 days (11/18/2017); 6/10 L knee pain at most for the past 7 days (12/12/2017); 5/10 L knee pain at most for the past 7 days (pt states doing her exercises helps decrease pain; 01/02/2018); L knee 3/10 (02/05/2018); 4/10 at most for the past 7 days (03/03/2018); 5.5/10 L knee at most for the past 7 days (03/27/2018); 6-7/10 L knee pain at most (05/08/2018); 7/10 L knee pain at worst for the past month (07/22/2018);  6/10 L knee pain at most for the past 7  days (09/22/2018); 5-6/10 at most for the past 7 days sporadically, can get it better with exercise (10/27/2018); 6/10 L knee ache at most for the past 7 days (12/17/2018); 5-6/10 L knee pain at most for the past 7 days such as going up and down steps (01/29/2019); 5/10 at most for the past 7 days (03/25/2019); 5.5/10 (05/20/19); 4/10 (06/10/2019); 4/10 (07/22/2019)    Time 8    Period Weeks    Status Partially Met    Target Date 09/17/19      PT LONG TERM GOAL #3   Title Patient will improve TUG time to 12 seconds or less as a demonstration of improved functional mobility and balance.     Baseline TUG no AD: 16.05 seconds on average (09/24/2017); 14.3 seconds on average (10/29/2017); 13 seconds average (11/21/2017); 16.67 seconds average (12/12/2017);  14.17 seconds average (01/02/2018); 11.8 sec sonds (02/05/2018)    Time 6    Period Weeks    Status Achieved      PT LONG TERM GOAL #4   Title Patient will improve her back FOTO score by at least 10 points as a demonstration of improved funtion.      Baseline Back FOTO: 33 (09/24/2017); 38 (10/29/2017); 36 (11/21/2017); 40 (12/12/2017); 42 (01/02/2018); 47 (03/03/2018)    Time 6    Period Weeks    Status Achieved      PT LONG TERM GOAL #5   Title Patient will improve bilateral LE strength by at least 1/2 MMT grade to promote ability to perform standing tasks.     Time 6    Period Weeks    Status Achieved      PT LONG TERM GOAL #6   Title Patient will improve her Modified Oswestry Low Back pain disablity questionnaire by at least 10% as a demonstration of improved function.     Baseline 48% (09/24/2017); 46% (10/29/2017), (11/21/2017); 56% (12/12/2017); 36% (01/02/2018), (03/03/2018)    Time 6    Period Weeks    Status Achieved      PT LONG TERM GOAL #7   Title Pt will report decreased difficulty stepping into and out of her bathtub as well as getting into and out of her truck to promote ability to get into and out of places.     Baseline Pt reports  difficulty stepping into and out of her bathtub as well as difficulty getting into and out of her truck (01/02/2018); Pt states the truck and bath tub and difficult and painful.  (02/05/2018); Improving per subjective reports (03/03/2018); decreased difficulty with truck tranfser after cues for technique and practice, decreasing difficulty with stepping into and out of the bath tub based on subjective reports (03/27/2018 and 04/01/2018); Decreasing difficulty  stepping into a bathtub. No difficulty getting into and out of her truck (05/08/2018), (07/22/2018); Difficulty stepping into and out of the bath tub. No difficulty with truck transfers (09/22/2018); Able to get into her bath tub, difficulty getting out (10/27/18); Still has difficulty getting into and out of bathtub (02/23/2019); Better able to step into the bathtub. Still has difficulty stepping out of the bathtub due to knee flexion (03/25/2019); No difficulty stepping into her bath tub. Still has difficulty stepping out due to decreased L knee flexion ROM (05/20/2019), (06/10/2019), (07/22/2019)    Time 8    Period Weeks    Status Partially Met    Target Date 09/17/19      PT LONG TERM GOAL #8   Title Patient will improve her BERG balance test score to 46/56 or more as a demonstration of decreased fall risk     Baseline 42/56 (01/02/2018); 54/56 (02/05/2018)    Time 6    Period Weeks    Status Achieved      PT LONG TERM GOAL  #9   TITLE Pt will report minimal to no dizziness related to vertigo to promote balance and decrease fall risk.     Baseline Pt reports dizziness (related to vertigo) which seem to affect her balance (01/15/2018); Patient reports "a little bit every now and then" (02/05/2018); Pt reports dizziness and decreased balance (01/29/2019); Dizziness has improved some (02/23/2019), (03/25/2019); Still has dizziness and vertigo; does the [place] for both sides instead of just the L side (06/10/2019)    Time 8    Period Weeks    Status On-going      Target Date 09/17/19      PT LONG TERM GOAL  #10   TITLE Pt will be able to turn 360 degrees to the R and L at least 3 times without dizziness and no LOB to promote safety and decrease fall risk.    Baseline 360 degree turn to the R and L causes a loss of balance (12/17/2018), (01/29/2019). able to perform 3 times with each side with improved balance, minimal dizziness. (02/23/2019); Dizziness (05/20/19), slight dizziness (06/10/2019), (07/22/2019)    Time 8    Period Weeks    Status On-going    Target Date 09/17/19      PT LONG TERM GOAL  #11   TITLE Patient will have a decrease in R hip and knee pain to 4/10 or less at worst to promote ability to ambulate with less discomfort.    Baseline 7/10 R hip and knee pain at worst for the past month (03/25/2019); 5/10 R hip pain, 6/10 R knee pain at most for the past 7 days (05/20/19); 5.5/10 R knee pain (05/20/19); 8/10 R hip; 6/10 R knee at most for the past 7 days (06/10/2019); 7/10, easier to decrease pain since starting PT (07/22/2019)    Time 8    Period Weeks    Status On-going    Target Date 09/17/19                 Plan - 09/02/19 1312    Clinical Impression Statement Pt returns to clinic with minimal to no complains of pain with reports of improved ability to perform sit to stand transfers more steadily. Continued working on glute med and core strength to decrease stress to low back and B knees when ambulating and performing standing tasks. Also worked on weight shifting to transfer center of gravity onto base of support promote balance and decrease fall  risk.  Pt tolerated session well without aggravation of symptoms. Pt will benefit from continued skilled physical therapy services to decrease pain, improve strength and function.    Personal Factors and Comorbidities Age;Comorbidity 3+;Time since onset of injury/illness/exacerbation;Fitness;Past/Current Experience    Comorbidities Back, knee pain, hx of dizziness, hx of back surgery, hx  of C-section; memory lapse    Examination-Activity Limitations Squat    Stability/Clinical Decision Making Stable/Uncomplicated    Rehab Potential Fair    Clinical Impairments Affecting Rehab Potential difficulty with carry-over of improvements in clinic, chronicity of condition, age, multiple problem areas; MEMORY lapse    PT Frequency 1x / week    PT Duration Other (comment)   5 weeks   PT Treatment/Interventions Therapeutic activities;Therapeutic exercise;Balance training;Neuromuscular re-education;Patient/family education;Manual techniques;Dry needling;Aquatic Therapy;Electrical Stimulation;Iontophoresis 32m/ml Dexamethasone;Gait training;Canalith Repostioning;Vestibular    PT Next Visit Plan  hip and knee strengthening, core strengthening, patellar mobility, manual techniques, modalities PRN    Consulted and Agree with Plan of Care Patient           Patient will benefit from skilled therapeutic intervention in order to improve the following deficits and impairments:  Pain, Improper body mechanics, Postural dysfunction, Dizziness, Decreased strength, Difficulty walking, Decreased balance  Visit Diagnosis: Muscle weakness (generalized)  Radiculopathy, lumbar region     Problem List Patient Active Problem List   Diagnosis Date Noted  . Obese 06/20/2016  . Status post right knee replacement 06/19/2016  . S/P left TKA 06/18/2016  . Spinal stenosis of lumbar region 09/14/2015   MJoneen BoersPT, DPT   09/02/2019, 2:52 PM  [facility] [address] [place] [address]Phone: [phone]  Fax:  [phone] Name: RCHITARA CLONCHMRN: [id number] of Birth: 410-Jan-1952

## 2019-09-07 ENCOUNTER — Ambulatory Visit: Payer: Medicare Other

## 2019-09-07 ENCOUNTER — Telehealth: Payer: Self-pay

## 2019-09-07 NOTE — Telephone Encounter (Signed)
No show. Called patient who said that that she thought the session was on Wednesday. Scheduled a session for 11:15 am this Wednesday 09/09/19

## 2019-09-09 ENCOUNTER — Other Ambulatory Visit: Payer: Self-pay

## 2019-09-09 ENCOUNTER — Ambulatory Visit: Payer: Medicare Other

## 2019-09-09 DIAGNOSIS — M5416 Radiculopathy, lumbar region: Secondary | ICD-10-CM

## 2019-09-09 DIAGNOSIS — M6281 Muscle weakness (generalized): Secondary | ICD-10-CM

## 2019-09-09 NOTE — Therapy (Signed)
[facility] [address] [place], [address] Phone: [phone]   Fax:  [phone]  Physical Therapy Treatment  Patient Details  Name: Destiny Shaw MRN: [id number] Date of Birth: 1950-10-24 Referring Provider (PT): Paralee Cancel, MD   Encounter Date: 09/09/2019   PT End of Session - 09/09/19 1123    Visit Number [id number]    Number of Visits 110    Date for PT Re-Evaluation 09/17/19    Authorization Type 9    Authorization Time Period of 10 progress note Medicare    PT Start Time 1123    PT Stop Time 1208    PT Time Calculation (min) 45 min    Activity Tolerance Patient tolerated treatment well    Behavior During Therapy [facility] for tasks assessed/performed           Past Medical History:  Diagnosis Date  . ADD (attention deficit disorder)   . Anemia   . Anginal pain ([place])    pt states has occas chest pain relates to indigestion; pt uses rest to relieve   . Anxiety   . Arthritis   . Concussion   . Depression   . Diabetes mellitus without complication ([place])   . Dizziness   . Fall   . GERD (gastroesophageal reflux disease)   . Headache   . History of urinary tract infection   . Hyperlipidemia   . Hypertension   . Hypothyroidism   . IBS (irritable bowel syndrome)   . Imbalance   . Numbness    right leg   . Numbness in both hands    comes and goes   . Pneumonia    last episode approx 1 year ago   . Sleep apnea   . Wears glasses     Past Surgical History:  Procedure Laterality Date  . BREAST CYST ASPIRATION Left 1980's   neg  . BREAST CYST EXCISION Right 1980's   neg  . BREAST LUMPECTOMY Right   . BREAST SURGERY    . CARPAL TUNNEL RELEASE    . CESAREAN SECTION     times 2  . COLONOSCOPY WITH PROPOFOL N/A 05/11/2015   Procedure: COLONOSCOPY WITH PROPOFOL;  Surgeon: Manya Silvas, MD;  Location: [facility] ENDOSCOPY;  Service: Endoscopy;  Laterality: N/A;  . DE QUERVAIN'S RELEASE Right   .  ESOPHAGOGASTRODUODENOSCOPY (EGD) WITH PROPOFOL N/A 05/11/2015   Procedure: ESOPHAGOGASTRODUODENOSCOPY (EGD) WITH PROPOFOL;  Surgeon: Manya Silvas, MD;  Location: [facility] ENDOSCOPY;  Service: Endoscopy;  Laterality: N/A;  . EYE SURGERY     laser surgery bilat   . HERNIA REPAIR    . KNEE ARTHROSCOPY    . LUMBAR LAMINECTOMY/DECOMPRESSION MICRODISCECTOMY Bilateral 09/14/2015   Procedure: MICRO LUMBAR BILATERAL DECOMPRESSION L4 - L5;  Surgeon: Susa Day, MD;  Location: [facility] ORS;  Service: Orthopedics;  Laterality: Bilateral;  . REDUCTION MAMMAPLASTY Bilateral 1980  . RHINOPLASTY    . TONSILLECTOMY    . TOTAL KNEE ARTHROPLASTY Left 06/18/2016   Procedure: LEFT TOTAL KNEE ARTHROPLASTY;  Surgeon: Paralee Cancel, MD;  Location: [facility] ORS;  Service: Orthopedics;  Laterality: Left;  Adductor Block  . TUBAL LIGATION    . UVULOPALATOPHARYNGOPLASTY      There were no vitals filed for this visit.   Subjective Assessment - 09/09/19 1125    Subjective R LE and R lateral trunk has been extremely painful. Feels better right now. Usually acts up mid day or evening, depends on what she  has been doing such as walking around.  Lies down flat on her back gives her relief. Also takes aspirin whem it happens.  R lateral trunk pain started about a week ago.  Went to [place] yesterday to get checked out. Blood work was pretty good. Also got x-rays but does not know the results yet.  R lateral trunk is not currently bothering her. No R hip or back pain currently. 3/10 R knee joint pain currently.  Pt also said that she got hit by a car door on her R side, front which started her R trunk pain.    Patient is accompained by: Family member    Pertinent History Low back pain. Pt states having back surgery about 2-3 years ago. After she had her L TKA, her back started aggravating her due to her walking. Pt also fell about 4 weeks ago onto her L knee. Dr. Alvan Dame checked out her L knee. L knee was fine but has some inflammation.  Pt  tries to keep it iced. Pt still recovering for her L TKA but the fall set her back.   Her L knee surgery was last year.  L knee still bothers her a lot.  Had surgery for her back before her L knee surgery. Was doing well until her L knee surgery. The fall made it worse. Feels debilitating. Also has a hard time getting into and out of the car mainly due to her L knee.   Pt fell due to her L knee bucking on her. Pt was trying to pick something up from the side of the couch.  Also has a hard time getting up from the floor.  Pt states having bladder accidents since after her fall (pt was recommended to tell her MD about it).  Bowel problems since taking medications (was constapated, but after taking medications, pt had diarrhea which was difficult to control. Getting out of the metformin fixed the bowel issue).  Pt states tingling and numbness L lateral LE along the L5 dermatome.  Denies saddle anesthesia.  Pt states that her back problem is mainly on her L side.  Pt landed on her L knee when she fell.  No other falls within the last 6 months. Pt also states being very dizzy since her L knee surgery.  Had PT treatement for her dizziness before which did not help.  The room feels like it is spinning when she gets dizzy.  I feel like it is getting better then she does something.  5/10 low back soreness currently (7/10 at most for the past 7 days after negotiating stairs, 5/10 without doing stairs). 4/10 L knee pain currenglty with gait 5/10 L knee pain at most for the past 7 days.    Patient Stated Goals Be better able to get into and out of her vehicle (midsized truck), into and out of chairs, be better able to roll in her bed with less L knee pain. Be able to get down on the floor and get back up.     Currently in Pain? Yes    Pain Score 3     Pain Location Knee    Pain Orientation Right                                     PT Education - 09/09/19 1156    Education Details ther-ex     Person(s) Educated Patient  Methods Explanation;Demonstration;Tactile cues;Verbal cues    Comprehension Returned demonstration;Verbalized understanding          Objectives   MedbridgeAccess Code: [email or web address]  Latex free bands usedif used   Sitting posture: L trunk rotation  Standing posture: slightLlateral shift  Pt states that pressing her back against the chair feels better.  Gait observation: increased L lateral lean during L LE stance phase.    Observation: pt traced pathway of R lateral trunk pain. Pathway seem to follow thoracic dermatome pathway.    Manual therapy  TTP L T10,11,12 TP   Seated R UPA T10, T11, T12 TP grade 3  Decreased TTP L side  Seated STM R thoracic paraspinal muscles    Therapeutic exercise  Time spent listening to pt subjective reports.   R trunk rotation reproduced symptoms  Seated manually resisted L trunk rotation isometrics 10x5 seconds, then 7x5 seconds  Increased R lateral hip symptoms.   Seated R hip extension isometrics 10x5 seconds for 3 sets  decreased R lateral trunk symptoms with R trunk rotation afterwards  Decreased R lateral hip symptoms  Seated L trunk rotation 10x5 seconds for 3 sets  Reviewed and given as part of her HEP. Pt demonstrated and verbalized understanding.   Pt education on R T10-12 dermatome pathway and how it may be associated with the pain she traced with her hand.   Improved exercise technique, movement at target joints, use of target muscles after mod verbal, visual, tactile cues.      Response to treatment Pt tolerated session well without aggravation of symptoms. Decreased R knee pain after session.    Clinical impression R lateral trunk pain seems to be related to R thoracic dermatome based on pathway traced by pt and reports that her pain seems to start after being hit by a car door on her R side.  Worked on improving L thoracic rotation and R glute  strength to help improve intervertebral space on R trunk and low back. Decreased symptoms with R trunk rotation afterwards. Pt will benefit from continued skilled physical therapy services to decreased pain, improve strength and function.          PT Short Term Goals - 06/10/19 1849      PT SHORT TERM GOAL #1   Title Patient will be independent with her HEP to help decrease back and L knee pain and improve ability to perform functional tasks.     Time 3    Period Weeks    Status On-going    Target Date 07/02/19             PT Long Term Goals - 07/22/19 1439      PT LONG TERM GOAL #1   Title Patient will have a decrease in back pain to 3/10 or less at worst to promote ability to ambulate, turn in bed, perform standing tasks with less pain.     Baseline 6/10 back pain at most (09/24/2017); 7-8/10 at most for the past 7 days, duration of pain is better since starting PT (10/29/2017); 5-6/10 at most for the past 7 days (11/18/2017); 8/10 at most for the past 7 days but the duration of pain is less compared to prior to starting PT (12/12/2017).  7/10 back pain at most for the past 7 days (pt states doing her exercises helps decrease her pain; 01/02/2018); walking (3/10), standing (4/10), bed mobility (0/10) 02/05/2018; 6/10 back pain at most for the past 7 days (03/03/2018); 6.5/10 back pain at  most for the past 7 days (03/27/2018); 7-8/10 at most (05/08/2018); 7/10 back pain at worst (07/22/2018);  7-8/10 at most for the past 7 days (09/22/2018); 5-7/10 at worst for the past 7 days then calms down (10/27/2018); 6/10 back pain at most for the past 7 days (when pt cooks and cleans up in kitchen; 12/17/2018); 6-7/10 back spams pain at most for the past 7 days, usually when she is doing stuff in the kitchen (01/29/2019); 7/10 at worst for the past 7 days (03/25/2019); 7.5/10 (05/20/19); 8/10 low back pain at most for the past 7 days (06/10/2019); 7/10 at worst; Easier to decrease back pain since starting PT  (07/22/2019)    Time 8    Period Weeks    Status On-going    Target Date 09/17/19      PT LONG TERM GOAL #2   Title Patient will have a decrease in L knee pain to 3/10 or less at worst to promote ability to ambulate, negotiate stairs, get into and out of a car more comfortably.     Baseline 6/10 L knee pain at most for the past 3 months (09/24/2017); 7-8/10 at most for the past 7 days, duration of pain is better since starting PT (10/29/2017); 7/10 at most for the past 7 days (11/18/2017); 6/10 L knee pain at most for the past 7 days (12/12/2017); 5/10 L knee pain at most for the past 7 days (pt states doing her exercises helps decrease pain; 01/02/2018); L knee 3/10 (02/05/2018); 4/10 at most for the past 7 days (03/03/2018); 5.5/10 L knee at most for the past 7 days (03/27/2018); 6-7/10 L knee pain at most (05/08/2018); 7/10 L knee pain at worst for the past month (07/22/2018);  6/10 L knee pain at most for the past 7 days (09/22/2018); 5-6/10 at most for the past 7 days sporadically, can get it better with exercise (10/27/2018); 6/10 L knee ache at most for the past 7 days (12/17/2018); 5-6/10 L knee pain at most for the past 7 days such as going up and down steps (01/29/2019); 5/10 at most for the past 7 days (03/25/2019); 5.5/10 (05/20/19); 4/10 (06/10/2019); 4/10 (07/22/2019)    Time 8    Period Weeks    Status Partially Met    Target Date 09/17/19      PT LONG TERM GOAL #3   Title Patient will improve TUG time to 12 seconds or less as a demonstration of improved functional mobility and balance.     Baseline TUG no AD: 16.05 seconds on average (09/24/2017); 14.3 seconds on average (10/29/2017); 13 seconds average (11/21/2017); 16.67 seconds average (12/12/2017);  14.17 seconds average (01/02/2018); 11.8 sec sonds (02/05/2018)    Time 6    Period Weeks    Status Achieved      PT LONG TERM GOAL #4   Title Patient will improve her back FOTO score by at least 10 points as a demonstration of improved funtion.       Baseline Back FOTO: 33 (09/24/2017); 38 (10/29/2017); 36 (11/21/2017); 40 (12/12/2017); 42 (01/02/2018); 47 (03/03/2018)    Time 6    Period Weeks    Status Achieved      PT LONG TERM GOAL #5   Title Patient will improve bilateral LE strength by at least 1/2 MMT grade to promote ability to perform standing tasks.     Time 6    Period Weeks    Status Achieved      PT LONG TERM GOAL #  6   Title Patient will improve her Modified Oswestry Low Back pain disablity questionnaire by at least 10% as a demonstration of improved function.     Baseline 48% (09/24/2017); 46% (10/29/2017), (11/21/2017); 56% (12/12/2017); 36% (01/02/2018), (03/03/2018)    Time 6    Period Weeks    Status Achieved      PT LONG TERM GOAL #7   Title Pt will report decreased difficulty stepping into and out of her bathtub as well as getting into and out of her truck to promote ability to get into and out of places.     Baseline Pt reports difficulty stepping into and out of her bathtub as well as difficulty getting into and out of her truck (01/02/2018); Pt states the truck and bath tub and difficult and painful.  (02/05/2018); Improving per subjective reports (03/03/2018); decreased difficulty with truck tranfser after cues for technique and practice, decreasing difficulty with stepping into and out of the bath tub based on subjective reports (03/27/2018 and 04/01/2018); Decreasing difficulty stepping into a bathtub. No difficulty getting into and out of her truck (05/08/2018), (07/22/2018); Difficulty stepping into and out of the bath tub. No difficulty with truck transfers (09/22/2018); Able to get into her bath tub, difficulty getting out (10/27/18); Still has difficulty getting into and out of bathtub (02/23/2019); Better able to step into the bathtub. Still has difficulty stepping out of the bathtub due to knee flexion (03/25/2019); No difficulty stepping into her bath tub. Still has difficulty stepping out due to decreased L knee flexion ROM  (05/20/2019), (06/10/2019), (07/22/2019)    Time 8    Period Weeks    Status Partially Met    Target Date 09/17/19      PT LONG TERM GOAL #8   Title Patient will improve her BERG balance test score to 46/56 or more as a demonstration of decreased fall risk     Baseline 42/56 (01/02/2018); 54/56 (02/05/2018)    Time 6    Period Weeks    Status Achieved      PT LONG TERM GOAL  #9   TITLE Pt will report minimal to no dizziness related to vertigo to promote balance and decrease fall risk.     Baseline Pt reports dizziness (related to vertigo) which seem to affect her balance (01/15/2018); Patient reports "a little bit every now and then" (02/05/2018); Pt reports dizziness and decreased balance (01/29/2019); Dizziness has improved some (02/23/2019), (03/25/2019); Still has dizziness and vertigo; does the [place] for both sides instead of just the L side (06/10/2019)    Time 8    Period Weeks    Status On-going    Target Date 09/17/19      PT LONG TERM GOAL  #10   TITLE Pt will be able to turn 360 degrees to the R and L at least 3 times without dizziness and no LOB to promote safety and decrease fall risk.    Baseline 360 degree turn to the R and L causes a loss of balance (12/17/2018), (01/29/2019). able to perform 3 times with each side with improved balance, minimal dizziness. (02/23/2019); Dizziness (05/20/19), slight dizziness (06/10/2019), (07/22/2019)    Time 8    Period Weeks    Status On-going    Target Date 09/17/19      PT LONG TERM GOAL  #11   TITLE Patient will have a decrease in R hip and knee pain to 4/10 or less at worst to promote ability to ambulate with less  discomfort.    Baseline 7/10 R hip and knee pain at worst for the past month (03/25/2019); 5/10 R hip pain, 6/10 R knee pain at most for the past 7 days (05/20/19); 5.5/10 R knee pain (05/20/19); 8/10 R hip; 6/10 R knee at most for the past 7 days (06/10/2019); 7/10, easier to decrease pain since starting PT (07/22/2019)    Time 8     Period Weeks    Status On-going    Target Date 09/17/19                 Plan - 09/09/19 1156    Clinical Impression Statement R lateral trunk pain seems to be related to R thoracic dermatome based on pathway traced by pt and reports that her pain seems to start after being hit by a car door on her R side.  Worked on improving L thoracic rotation and R glute strength to help improve intervertebral space on R trunk and low back. Decreased symptoms with R trunk rotation afterwards. Pt will benefit from continued skilled physical therapy services to decreased pain, improve strength and function.    Personal Factors and Comorbidities Age;Comorbidity 3+;Time since onset of injury/illness/exacerbation;Fitness;Past/Current Experience    Comorbidities Back, knee pain, hx of dizziness, hx of back surgery, hx of C-section; memory lapse    Examination-Activity Limitations Squat    Stability/Clinical Decision Making Stable/Uncomplicated    Rehab Potential Fair    Clinical Impairments Affecting Rehab Potential difficulty with carry-over of improvements in clinic, chronicity of condition, age, multiple problem areas; MEMORY lapse    PT Frequency 1x / week    PT Duration Other (comment)   5 weeks   PT Treatment/Interventions Therapeutic activities;Therapeutic exercise;Balance training;Neuromuscular re-education;Patient/family education;Manual techniques;Dry needling;Aquatic Therapy;Electrical Stimulation;Iontophoresis 38m/ml Dexamethasone;Gait training;Canalith Repostioning;Vestibular    PT Next Visit Plan  hip and knee strengthening, core strengthening, patellar mobility, manual techniques, modalities PRN    Consulted and Agree with Plan of Care Patient           Patient will benefit from skilled therapeutic intervention in order to improve the following deficits and impairments:  Pain, Improper body mechanics, Postural dysfunction, Dizziness, Decreased strength, Difficulty walking, Decreased  balance  Visit Diagnosis: Muscle weakness (generalized)  Radiculopathy, lumbar region     Problem List Patient Active Problem List   Diagnosis Date Noted  . Obese 06/20/2016  . Status post right knee replacement 06/19/2016  . S/P left TKA 06/18/2016  . Spinal stenosis of lumbar region 09/14/2015    MJoneen BoersPT, DPT   09/09/2019, 12:26 PM  [facility] [address] [place] [address]Phone: [phone]  Fax:  [phone] Name: Destiny FORREYMRN: [id number] of Birth: [birth year]

## 2019-09-09 NOTE — Patient Instructions (Signed)
Seated L trunk rotation 10x5 seconds for 3 sets  Reviewed and given as part of her HEP. Pt demonstrated and verbalized understanding.

## 2019-09-16 ENCOUNTER — Ambulatory Visit: Payer: Medicare Other

## 2019-09-16 ENCOUNTER — Other Ambulatory Visit: Payer: Self-pay

## 2019-09-16 DIAGNOSIS — M6281 Muscle weakness (generalized): Secondary | ICD-10-CM | POA: Diagnosis not present

## 2019-09-16 DIAGNOSIS — Z9181 History of falling: Secondary | ICD-10-CM

## 2019-09-16 DIAGNOSIS — M5416 Radiculopathy, lumbar region: Secondary | ICD-10-CM

## 2019-09-16 DIAGNOSIS — R42 Dizziness and giddiness: Secondary | ICD-10-CM

## 2019-09-16 DIAGNOSIS — M25551 Pain in right hip: Secondary | ICD-10-CM

## 2019-09-16 DIAGNOSIS — M25561 Pain in right knee: Secondary | ICD-10-CM

## 2019-09-16 DIAGNOSIS — G8929 Other chronic pain: Secondary | ICD-10-CM

## 2019-09-16 NOTE — Therapy (Signed)
[facility] [address] [place], [address] Phone: [phone]   Fax:  [phone]  Physical Therapy Treatment And Progress Report (06/17/2019 - 09/16/2019)  Patient Details  Name: Destiny Shaw MRN: [id number] Date of Birth: Jun 29, 1950 Referring Provider (PT): Paralee Cancel, MD   Encounter Date: 09/16/2019   PT End of Session - 09/16/19 1338    Visit Number [id number]    Number of Visits 110    Date for PT Re-Evaluation 11/12/19    Authorization Type 10    Authorization Time Period of 10 progress note Medicare    PT Start Time 1339    PT Stop Time 1423    PT Time Calculation (min) 44 min    Activity Tolerance Patient tolerated treatment well    Behavior During Therapy [facility] for tasks assessed/performed           Past Medical History:  Diagnosis Date  . ADD (attention deficit disorder)   . Anemia   . Anginal pain ([place])    pt states has occas chest pain relates to indigestion; pt uses rest to relieve   . Anxiety   . Arthritis   . Concussion   . Depression   . Diabetes mellitus without complication ([place])   . Dizziness   . Fall   . GERD (gastroesophageal reflux disease)   . Headache   . History of urinary tract infection   . Hyperlipidemia   . Hypertension   . Hypothyroidism   . IBS (irritable bowel syndrome)   . Imbalance   . Numbness    right leg   . Numbness in both hands    comes and goes   . Pneumonia    last episode approx 1 year ago   . Sleep apnea   . Wears glasses     Past Surgical History:  Procedure Laterality Date  . BREAST CYST ASPIRATION Left 1980's   neg  . BREAST CYST EXCISION Right 1980's   neg  . BREAST LUMPECTOMY Right   . BREAST SURGERY    . CARPAL TUNNEL RELEASE    . CESAREAN SECTION     times 2  . COLONOSCOPY WITH PROPOFOL N/A 05/11/2015   Procedure: COLONOSCOPY WITH PROPOFOL;  Surgeon: Manya Silvas, MD;  Location: [facility] ENDOSCOPY;  Service: Endoscopy;   Laterality: N/A;  . DE QUERVAIN'S RELEASE Right   . ESOPHAGOGASTRODUODENOSCOPY (EGD) WITH PROPOFOL N/A 05/11/2015   Procedure: ESOPHAGOGASTRODUODENOSCOPY (EGD) WITH PROPOFOL;  Surgeon: Manya Silvas, MD;  Location: [facility] ENDOSCOPY;  Service: Endoscopy;  Laterality: N/A;  . EYE SURGERY     laser surgery bilat   . HERNIA REPAIR    . KNEE ARTHROSCOPY    . LUMBAR LAMINECTOMY/DECOMPRESSION MICRODISCECTOMY Bilateral 09/14/2015   Procedure: MICRO LUMBAR BILATERAL DECOMPRESSION L4 - L5;  Surgeon: Susa Day, MD;  Location: [facility] ORS;  Service: Orthopedics;  Laterality: Bilateral;  . REDUCTION MAMMAPLASTY Bilateral 1980  . RHINOPLASTY    . TONSILLECTOMY    . TOTAL KNEE ARTHROPLASTY Left 06/18/2016   Procedure: LEFT TOTAL KNEE ARTHROPLASTY;  Surgeon: Paralee Cancel, MD;  Location: [facility] ORS;  Service: Orthopedics;  Laterality: Left;  Adductor Block  . TUBAL LIGATION    . UVULOPALATOPHARYNGOPLASTY      There were no vitals filed for this visit.   Subjective Assessment - 09/16/19 1340    Subjective About the same. R LE bothers her when she walks mostly. The therapy after last helped with the R  rib area. Went to a cardiac check up which seemed ok.  Has some soreness L trunk currently 4/10 currently. R LE is 5/10 currently. Pt states having more physical issues when she does not come to PT. Feels like PT makes a difference.    Patient is accompained by: Family member    Pertinent History Low back pain. Pt states having back surgery about 2-3 years ago. After she had her L TKA, her back started aggravating her due to her walking. Pt also fell about 4 weeks ago onto her L knee. Dr. Alvan Dame checked out her L knee. L knee was fine but has some inflammation.  Pt tries to keep it iced. Pt still recovering for her L TKA but the fall set her back.   Her L knee surgery was last year.  L knee still bothers her a lot.  Had surgery for her back before her L knee surgery. Was doing well until her L knee surgery. The fall made  it worse. Feels debilitating. Also has a hard time getting into and out of the car mainly due to her L knee.   Pt fell due to her L knee bucking on her. Pt was trying to pick something up from the side of the couch.  Also has a hard time getting up from the floor.  Pt states having bladder accidents since after her fall (pt was recommended to tell her MD about it).  Bowel problems since taking medications (was constapated, but after taking medications, pt had diarrhea which was difficult to control. Getting out of the metformin fixed the bowel issue).  Pt states tingling and numbness L lateral LE along the L5 dermatome.  Denies saddle anesthesia.  Pt states that her back problem is mainly on her L side.  Pt landed on her L knee when she fell.  No other falls within the last 6 months. Pt also states being very dizzy since her L knee surgery.  Had PT treatement for her dizziness before which did not help.  The room feels like it is spinning when she gets dizzy.  I feel like it is getting better then she does something.  5/10 low back soreness currently (7/10 at most for the past 7 days after negotiating stairs, 5/10 without doing stairs). 4/10 L knee pain currenglty with gait 5/10 L knee pain at most for the past 7 days.    Patient Stated Goals Be better able to get into and out of her vehicle (midsized truck), into and out of chairs, be better able to roll in her bed with less L knee pain. Be able to get down on the floor and get back up.     Currently in Pain? Yes    Pain Score 5     Pain Orientation Right                                     PT Education - 09/16/19 1340    Education Details ther-ex    Person(s) Educated Patient    Methods Explanation;Demonstration;Tactile cues;Verbal cues    Comprehension Returned demonstration;Verbalized understanding          Objectives   MedbridgeAccess Code: [email or web address]  Latex free bands usedif used   Sitting posture: L  trunk rotation  Standing posture: slightLlateral shift  Pt states that pressing her back against the chair feels better.  Gait observation: increased L  lateral lean during L LE stance phase.    Observation: pt traced pathway of R lateral trunk pain. Pathway seem to follow thoracic dermatome pathway.     Therapeutic exercise Reviewed progress/current status with pain level with pt.   Turning 360 degrees   3x R and L   improved balance with cues to evenly distribute her weight throughout her feet instead of her heels.   Forward step up onto first regular step with B UE assist   R LE 10x   L LE 10x2   standing knee flexion stretch on to 2nd step with B UE assist   L 10x5 seconds for 3 sets. Decreased L knee discomfort with forward step ups onto first regular step afterwards  R 10x5 seconds for 2 sets   Decreased B knee pain and L trunk soreness  Seated thoracic extension over chair 10x5 seconds for 3 sets  Decreased thoracic pain.    Improved exercise technique, movement at target joints, use of target muscles after mod verbal, visual, tactile cues.      Response to treatment Pt tolerated session well without aggravation of symptoms. Decreased R knee pain after session.    Clinical impression Improved low back and R hip and knee pain level compared to previous progress report. Continued working on improving B knee flexion ROM to decrease stiffness and pain, as well as improving thoracic extension to decrease lateral trunk pain as well as decrease stress to low back. Decreased B knee and lateral trunk pain after session. Pt will benefit from continued skilled physical therapy services to continue and maintain progress, prevent regression especially secondary to memory challenges previously reported and secondary to pt able to prevent regression of function when going to PT based on subjective reports.    RECERT    PT Short Term Goals -  06/10/19 1849      PT SHORT TERM GOAL #1   Title Patient will be independent with her HEP to help decrease back and L knee pain and improve ability to perform functional tasks.     Time 3    Period Weeks    Status On-going    Target Date 07/02/19             PT Long Term Goals - 09/16/19 1343      PT LONG TERM GOAL #1   Title Patient will have a decrease in back pain to 3/10 or less at worst to promote ability to ambulate, turn in bed, perform standing tasks with less pain.     Baseline 6/10 back pain at most (09/24/2017); 7-8/10 at most for the past 7 days, duration of pain is better since starting PT (10/29/2017); 5-6/10 at most for the past 7 days (11/18/2017); 8/10 at most for the past 7 days but the duration of pain is less compared to prior to starting PT (12/12/2017).  7/10 back pain at most for the past 7 days (pt states doing her exercises helps decrease her pain; 01/02/2018); walking (3/10), standing (4/10), bed mobility (0/10) 02/05/2018; 6/10 back pain at most for the past 7 days (03/03/2018); 6.5/10 back pain at most for the past 7 days (03/27/2018); 7-8/10 at most (05/08/2018); 7/10 back pain at worst (07/22/2018);  7-8/10 at most for the past 7 days (09/22/2018); 5-7/10 at worst for the past 7 days then calms down (10/27/2018); 6/10 back pain at most for the past 7 days (when pt cooks and cleans up in kitchen; 12/17/2018); 6-7/10 back spams pain  at most for the past 7 days, usually when she is doing stuff in the kitchen (01/29/2019); 7/10 at worst for the past 7 days (03/25/2019); 7.5/10 (05/20/19); 8/10 low back pain at most for the past 7 days (06/10/2019); 7/10 at worst; Easier to decrease back pain since starting PT (07/22/2019). 4-5/10 at most for the past 7 days (09/16/2019)    Time 8    Period Weeks    Status On-going    Target Date 11/12/19      PT LONG TERM GOAL #2   Title Patient will have a decrease in L knee pain to 3/10 or less at worst to promote ability to ambulate, negotiate  stairs, get into and out of a car more comfortably.     Baseline 6/10 L knee pain at most for the past 3 months (09/24/2017); 7-8/10 at most for the past 7 days, duration of pain is better since starting PT (10/29/2017); 7/10 at most for the past 7 days (11/18/2017); 6/10 L knee pain at most for the past 7 days (12/12/2017); 5/10 L knee pain at most for the past 7 days (pt states doing her exercises helps decrease pain; 01/02/2018); L knee 3/10 (02/05/2018); 4/10 at most for the past 7 days (03/03/2018); 5.5/10 L knee at most for the past 7 days (03/27/2018); 6-7/10 L knee pain at most (05/08/2018); 7/10 L knee pain at worst for the past month (07/22/2018);  6/10 L knee pain at most for the past 7 days (09/22/2018); 5-6/10 at most for the past 7 days sporadically, can get it better with exercise (10/27/2018); 6/10 L knee ache at most for the past 7 days (12/17/2018); 5-6/10 L knee pain at most for the past 7 days such as going up and down steps (01/29/2019); 5/10 at most for the past 7 days (03/25/2019); 5.5/10 (05/20/19); 4/10 (06/10/2019); 4/10 (07/22/2019); 4/10 L knee pain at most for the past 7 days (09/16/2019)    Time 8    Period Weeks    Status Partially Met    Target Date 11/12/19      PT LONG TERM GOAL #3   Title Patient will improve TUG time to 12 seconds or less as a demonstration of improved functional mobility and balance.     Baseline TUG no AD: 16.05 seconds on average (09/24/2017); 14.3 seconds on average (10/29/2017); 13 seconds average (11/21/2017); 16.67 seconds average (12/12/2017);  14.17 seconds average (01/02/2018); 11.8 sec sonds (02/05/2018)    Time 6    Period Weeks    Status Achieved      PT LONG TERM GOAL #4   Title Patient will improve her back FOTO score by at least 10 points as a demonstration of improved funtion.      Baseline Back FOTO: 33 (09/24/2017); 38 (10/29/2017); 36 (11/21/2017); 40 (12/12/2017); 42 (01/02/2018); 47 (03/03/2018)    Time 6    Period Weeks    Status Achieved      PT  LONG TERM GOAL #5   Title Patient will improve bilateral LE strength by at least 1/2 MMT grade to promote ability to perform standing tasks.     Time 6    Period Weeks    Status Achieved      PT LONG TERM GOAL #6   Title Patient will improve her Modified Oswestry Low Back pain disablity questionnaire by at least 10% as a demonstration of improved function.     Baseline 48% (09/24/2017); 46% (10/29/2017), (11/21/2017); 56% (12/12/2017); 36% (01/02/2018), (03/03/2018)  Time 6    Period Weeks    Status Achieved      PT LONG TERM GOAL #7   Title Pt will report decreased difficulty stepping into and out of her bathtub as well as getting into and out of her truck to promote ability to get into and out of places.     Baseline Pt reports difficulty stepping into and out of her bathtub as well as difficulty getting into and out of her truck (01/02/2018); Pt states the truck and bath tub and difficult and painful.  (02/05/2018); Improving per subjective reports (03/03/2018); decreased difficulty with truck tranfser after cues for technique and practice, decreasing difficulty with stepping into and out of the bath tub based on subjective reports (03/27/2018 and 04/01/2018); Decreasing difficulty stepping into a bathtub. No difficulty getting into and out of her truck (05/08/2018), (07/22/2018); Difficulty stepping into and out of the bath tub. No difficulty with truck transfers (09/22/2018); Able to get into her bath tub, difficulty getting out (10/27/18); Still has difficulty getting into and out of bathtub (02/23/2019); Better able to step into the bathtub. Still has difficulty stepping out of the bathtub due to knee flexion (03/25/2019); No difficulty stepping into her bath tub. Still has difficulty stepping out due to decreased L knee flexion ROM (05/20/2019), (06/10/2019), (07/22/2019), (09/16/2019)    Time 8    Period Weeks    Status Partially Met    Target Date 11/12/19      PT LONG TERM GOAL #8   Title Patient will  improve her BERG balance test score to 46/56 or more as a demonstration of decreased fall risk     Baseline 42/56 (01/02/2018); 54/56 (02/05/2018)    Time 6    Period Weeks    Status Achieved      PT LONG TERM GOAL  #9   TITLE Pt will report minimal to no dizziness related to vertigo to promote balance and decrease fall risk.     Baseline Pt reports dizziness (related to vertigo) which seem to affect her balance (01/15/2018); Patient reports "a little bit every now and then" (02/05/2018); Pt reports dizziness and decreased balance (01/29/2019); Dizziness has improved some (02/23/2019), (03/25/2019); Still has dizziness and vertigo; does the [place] for both sides instead of just the L side (06/10/2019), Dizziness has decreased. Does Epley for L side (09/16/2019)    Time 8    Period Weeks    Status Partially Met    Target Date 11/12/19      PT LONG TERM GOAL  #10   TITLE Pt will be able to turn 360 degrees to the R and L at least 3 times without dizziness and no LOB to promote safety and decrease fall risk.    Baseline 360 degree turn to the R and L causes a loss of balance (12/17/2018), (01/29/2019). able to perform 3 times with each side with improved balance, minimal dizziness. (02/23/2019); Dizziness (05/20/19), slight dizziness (06/10/2019), (07/22/2019), (09/16/2019)    Time 8    Period Weeks    Status On-going    Target Date 11/12/19      PT LONG TERM GOAL  #11   TITLE Patient will have a decrease in R hip and knee pain to 4/10 or less at worst to promote ability to ambulate with less discomfort.    Baseline 7/10 R hip and knee pain at worst for the past month (03/25/2019); 5/10 R hip pain, 6/10 R knee pain at most for the past  7 days (05/20/19); 5.5/10 R knee pain (05/20/19); 8/10 R hip; 6/10 R knee at most for the past 7 days (06/10/2019); 7/10, easier to decrease pain since starting PT (07/22/2019); 6/10  R hip, and 6/10 R knee at most for the past 7 days (09/16/2019)    Time 8    Period Weeks     Status On-going    Target Date 11/12/19                 Plan - 09/16/19 1338    Clinical Impression Statement Improved low back and R hip and knee pain level compared to previous progress report. Continued working on improving B knee flexion ROM to decrease stiffness and pain, as well as improving thoracic extension to decrease lateral trunk pain as well as decrease stress to low back. Decreased B knee and lateral trunk pain after session. Pt will benefit from continued skilled physical therapy services to continue and maintain progress, prevent regression especially secondary to memory challenges previously reported and secondary to pt able to prevent regression of function when going to PT based on subjective reports.    Personal Factors and Comorbidities Age;Comorbidity 3+;Time since onset of injury/illness/exacerbation;Fitness;Past/Current Experience    Comorbidities Back, knee pain, hx of dizziness, hx of back surgery, hx of C-section; memory lapse    Examination-Activity Limitations Squat    Stability/Clinical Decision Making Stable/Uncomplicated    Clinical Decision Making Low    Rehab Potential Fair    Clinical Impairments Affecting Rehab Potential difficulty with carry-over of improvements in clinic, chronicity of condition, age, multiple problem areas; MEMORY lapse    PT Frequency 1x / week    PT Duration 8 weeks    PT Treatment/Interventions Therapeutic activities;Therapeutic exercise;Balance training;Neuromuscular re-education;Patient/family education;Manual techniques;Dry needling;Aquatic Therapy;Electrical Stimulation;Iontophoresis 57m/ml Dexamethasone;Gait training;Canalith Repostioning;Vestibular    PT Next Visit Plan  hip and knee strengthening, core strengthening, patellar mobility, manual techniques, modalities PRN    Consulted and Agree with Plan of Care Patient           Patient will benefit from skilled therapeutic intervention in order to improve the following  deficits and impairments:  Pain, Improper body mechanics, Postural dysfunction, Dizziness, Decreased strength, Difficulty walking, Decreased balance  Visit Diagnosis: Muscle weakness (generalized) - Plan: PT plan of care cert/re-cert  Radiculopathy, lumbar region - Plan: PT plan of care cert/re-cert  Right knee pain, unspecified chronicity - Plan: PT plan of care cert/re-cert  Pain in right hip - Plan: PT plan of care cert/re-cert  History of falling - Plan: PT plan of care cert/re-cert  Dizziness and giddiness - Plan: PT plan of care cert/re-cert  Chronic pain of left knee - Plan: PT plan of care cert/re-cert     Problem List Patient Active Problem List   Diagnosis Date Noted  . Obese 06/20/2016  . Status post right knee replacement 06/19/2016  . S/P left TKA 06/18/2016  . Spinal stenosis of lumbar region 09/14/2015    Thank you for your referral.  MJoneen BoersPT, DPT   09/16/2019, 2:36 PM  [facility] [address] [place] [address]Phone: [phone]  Fax:  [phone] Name: Destiny UPLINGERMRN: [id number] of Birth: 401/06/1950

## 2019-09-23 ENCOUNTER — Ambulatory Visit: Payer: Medicare Other

## 2019-09-28 ENCOUNTER — Ambulatory Visit: Payer: Medicare Other | Attending: Orthopedic Surgery

## 2019-09-28 ENCOUNTER — Other Ambulatory Visit: Payer: Self-pay

## 2019-09-28 DIAGNOSIS — M25561 Pain in right knee: Secondary | ICD-10-CM | POA: Diagnosis present

## 2019-09-28 DIAGNOSIS — M6281 Muscle weakness (generalized): Secondary | ICD-10-CM | POA: Insufficient documentation

## 2019-09-28 DIAGNOSIS — M25551 Pain in right hip: Secondary | ICD-10-CM | POA: Diagnosis present

## 2019-09-28 DIAGNOSIS — M5416 Radiculopathy, lumbar region: Secondary | ICD-10-CM | POA: Insufficient documentation

## 2019-09-28 NOTE — Therapy (Signed)
[name] [address], [place], [address] Phone: [phone]   Fax:  [phone]  Physical Therapy Treatment  Patient Details  Name: Destiny Shaw MRN: [id number] Date of Birth: [birth year] Referring Provider (PT): Paralee Cancel, MD   Encounter Date: 09/28/2019   PT End of Session - 09/28/19 1737    Visit Number 81    Number of Visits 110    Date for PT Re-Evaluation 11/12/19    Authorization Type 1    Authorization Time Period of 10 progress note Medicare    PT Start Time 1737    PT Stop Time 1819    PT Time Calculation (min) 42 min    Activity Tolerance Patient tolerated treatment well    Behavior During Therapy [facility] for tasks assessed/performed           Past Medical History:  Diagnosis Date  . ADD (attention deficit disorder)   . Anemia   . Anginal pain ([place])    pt states has occas chest pain relates to indigestion; pt uses rest to relieve   . Anxiety   . Arthritis   . Concussion   . Depression   . Diabetes mellitus without complication ([place])   . Dizziness   . Fall   . GERD (gastroesophageal reflux disease)   . Headache   . History of urinary tract infection   . Hyperlipidemia   . Hypertension   . Hypothyroidism   . IBS (irritable bowel syndrome)   . Imbalance   . Numbness    right leg   . Numbness in both hands    comes and goes   . Pneumonia    last episode approx 1 year ago   . Sleep apnea   . Wears glasses     Past Surgical History:  Procedure Laterality Date  . BREAST CYST ASPIRATION Left 1980's   neg  . BREAST CYST EXCISION Right 1980's   neg  . BREAST LUMPECTOMY Right   . BREAST SURGERY    . CARPAL TUNNEL RELEASE    . CESAREAN SECTION     times 2  . COLONOSCOPY WITH PROPOFOL N/A 05/11/2015   Procedure: COLONOSCOPY WITH PROPOFOL;  Surgeon: Manya Silvas, MD;  Location: [facility] ENDOSCOPY;  Service: Endoscopy;  Laterality: N/A;  . DE QUERVAIN'S RELEASE Right   .  ESOPHAGOGASTRODUODENOSCOPY (EGD) WITH PROPOFOL N/A 05/11/2015   Procedure: ESOPHAGOGASTRODUODENOSCOPY (EGD) WITH PROPOFOL;  Surgeon: Manya Silvas, MD;  Location: [facility] ENDOSCOPY;  Service: Endoscopy;  Laterality: N/A;  . EYE SURGERY     laser surgery bilat   . HERNIA REPAIR    . KNEE ARTHROSCOPY    . LUMBAR LAMINECTOMY/DECOMPRESSION MICRODISCECTOMY Bilateral 09/14/2015   Procedure: MICRO LUMBAR BILATERAL DECOMPRESSION L4 - L5;  Surgeon: Susa Day, MD;  Location: [facility] ORS;  Service: Orthopedics;  Laterality: Bilateral;  . REDUCTION MAMMAPLASTY Bilateral 1980  . RHINOPLASTY    . TONSILLECTOMY    . TOTAL KNEE ARTHROPLASTY Left 06/18/2016   Procedure: LEFT TOTAL KNEE ARTHROPLASTY;  Surgeon: Paralee Cancel, MD;  Location: [facility] ORS;  Service: Orthopedics;  Laterality: Left;  Adductor Block  . TUBAL LIGATION    . UVULOPALATOPHARYNGOPLASTY      There were no vitals filed for this visit.   Subjective Assessment - 09/28/19 1738    Subjective Pt states having R anterior lateral thigh and L thigh pain. Pt was trying to improve L knee flexion ROM with with the knee flexion  stretch on stair step a little over a week ago.    Patient is accompained by: Family member    Pertinent History Low back pain. Pt states having back surgery about 2-3 years ago. After she had her L TKA, her back started aggravating her due to her walking. Pt also fell about 4 weeks ago onto her L knee. Dr. Alvan Dame checked out her L knee. L knee was fine but has some inflammation.  Pt tries to keep it iced. Pt still recovering for her L TKA but the fall set her back.   Her L knee surgery was last year.  L knee still bothers her a lot.  Had surgery for her back before her L knee surgery. Was doing well until her L knee surgery. The fall made it worse. Feels debilitating. Also has a hard time getting into and out of the car mainly due to her L knee.   Pt fell due to her L knee bucking on her. Pt was trying to pick something up from the side of  the couch.  Also has a hard time getting up from the floor.  Pt states having bladder accidents since after her fall (pt was recommended to tell her MD about it).  Bowel problems since taking medications (was constapated, but after taking medications, pt had diarrhea which was difficult to control. Getting out of the metformin fixed the bowel issue).  Pt states tingling and numbness L lateral LE along the L5 dermatome.  Denies saddle anesthesia.  Pt states that her back problem is mainly on her L side.  Pt landed on her L knee when she fell.  No other falls within the last 6 months. Pt also states being very dizzy since her L knee surgery.  Had PT treatement for her dizziness before which did not help.  The room feels like it is spinning when she gets dizzy.  I feel like it is getting better then she does something.  5/10 low back soreness currently (7/10 at most for the past 7 days after negotiating stairs, 5/10 without doing stairs). 4/10 L knee pain currenglty with gait 5/10 L knee pain at most for the past 7 days.    Patient Stated Goals Be better able to get into and out of her vehicle (midsized truck), into and out of chairs, be better able to roll in her bed with less L knee pain. Be able to get down on the floor and get back up.     Currently in Pain? Yes    Pain Score --   No pain level reported                                    PT Education - 09/28/19 1754    Education Details ther-ex, perform seated trunk flexion to decrease B lateral thigh pain    Person(s) Educated Patient    Methods Explanation;Demonstration;Tactile cues;Verbal cues    Comprehension Returned demonstration;Verbalized understanding           Objectives   MedbridgeAccess Code: [email or web address]  Latex free bands usedif used   Sitting posture: L trunk rotation  Standing posture: slightLlateral shift  Pt states that pressing her back against the chair feels better.  Gait  observation: increased L lateral lean during L LE stance phase.    Observation: pt traced pathway of R lateral trunk pain. Pathway seem to follow thoracic dermatome  pathway.  Manual therapy Seated STM B lumbar paraspinal muscles to decrease tension and fascial restrictions  Therapeutic exercise  Seated trunk flexion stretch 5x10 seconds, 10x 5-10 seconds, then 6x10 seconds    Decreased B lateral thigh pain when walking  Seated hip extension isometrics  R 10x5 seconds for 2 sets  L 10x5 seconds for 2 sets  Seated thoracic extension over chair 10x5 seconds  Sit <> stand from chair with arms, emphasis on even weight distribution on feet so as to not tip over backwards  5x2  More steady with sit <> stand transfer   Response to treatment Pt tolerated session well without aggravation of symptoms. No back or bilateral lateral thigh pain after session.    Clinical impression Bilateral lumbar paraspinal muscle tension and decreased fascial mobility palpated. Performed manual therapy to help address and decrease stress to low back. Decreased B lateral thigh pain during gait as well with treatment to stretch lumbar paraspinal muscles and improve glute max muscle activation. More steadiness observed with sit <> stand transfer when pt distributed her body weight more evenly throughout her feet instead of her heels. Pt will benefit from continued skilled physical therapy services to decrease pain, improve strength and function.       PT Short Term Goals - 06/10/19 1849      PT SHORT TERM GOAL #1   Title Patient will be independent with her HEP to help decrease back and L knee pain and improve ability to perform functional tasks.     Time 3    Period Weeks    Status On-going    Target Date 07/02/19             PT Long Term Goals - 09/16/19 1343      PT LONG TERM GOAL #1   Title Patient will have a decrease in back pain to 3/10 or less at worst to promote ability  to ambulate, turn in bed, perform standing tasks with less pain.     Baseline 6/10 back pain at most (09/24/2017); 7-8/10 at most for the past 7 days, duration of pain is better since starting PT (10/29/2017); 5-6/10 at most for the past 7 days (11/18/2017); 8/10 at most for the past 7 days but the duration of pain is less compared to prior to starting PT (12/12/2017).  7/10 back pain at most for the past 7 days (pt states doing her exercises helps decrease her pain; 01/02/2018); walking (3/10), standing (4/10), bed mobility (0/10) 02/05/2018; 6/10 back pain at most for the past 7 days (03/03/2018); 6.5/10 back pain at most for the past 7 days (03/27/2018); 7-8/10 at most (05/08/2018); 7/10 back pain at worst (07/22/2018);  7-8/10 at most for the past 7 days (09/22/2018); 5-7/10 at worst for the past 7 days then calms down (10/27/2018); 6/10 back pain at most for the past 7 days (when pt cooks and cleans up in kitchen; 12/17/2018); 6-7/10 back spams pain at most for the past 7 days, usually when she is doing stuff in the kitchen (01/29/2019); 7/10 at worst for the past 7 days (03/25/2019); 7.5/10 (05/20/19); 8/10 low back pain at most for the past 7 days (06/10/2019); 7/10 at worst; Easier to decrease back pain since starting PT (07/22/2019). 4-5/10 at most for the past 7 days (09/16/2019)    Time 8    Period Weeks    Status On-going    Target Date 11/12/19      PT LONG TERM GOAL #2  Title Patient will have a decrease in L knee pain to 3/10 or less at worst to promote ability to ambulate, negotiate stairs, get into and out of a car more comfortably.     Baseline 6/10 L knee pain at most for the past 3 months (09/24/2017); 7-8/10 at most for the past 7 days, duration of pain is better since starting PT (10/29/2017); 7/10 at most for the past 7 days (11/18/2017); 6/10 L knee pain at most for the past 7 days (12/12/2017); 5/10 L knee pain at most for the past 7 days (pt states doing her exercises helps decrease pain; 01/02/2018); L  knee 3/10 (02/05/2018); 4/10 at most for the past 7 days (03/03/2018); 5.5/10 L knee at most for the past 7 days (03/27/2018); 6-7/10 L knee pain at most (05/08/2018); 7/10 L knee pain at worst for the past month (07/22/2018);  6/10 L knee pain at most for the past 7 days (09/22/2018); 5-6/10 at most for the past 7 days sporadically, can get it better with exercise (10/27/2018); 6/10 L knee ache at most for the past 7 days (12/17/2018); 5-6/10 L knee pain at most for the past 7 days such as going up and down steps (01/29/2019); 5/10 at most for the past 7 days (03/25/2019); 5.5/10 (05/20/19); 4/10 (06/10/2019); 4/10 (07/22/2019); 4/10 L knee pain at most for the past 7 days (09/16/2019)    Time 8    Period Weeks    Status Partially Met    Target Date 11/12/19      PT LONG TERM GOAL #3   Title Patient will improve TUG time to 12 seconds or less as a demonstration of improved functional mobility and balance.     Baseline TUG no AD: 16.05 seconds on average (09/24/2017); 14.3 seconds on average (10/29/2017); 13 seconds average (11/21/2017); 16.67 seconds average (12/12/2017);  14.17 seconds average (01/02/2018); 11.8 sec sonds (02/05/2018)    Time 6    Period Weeks    Status Achieved      PT LONG TERM GOAL #4   Title Patient will improve her back FOTO score by at least 10 points as a demonstration of improved funtion.      Baseline Back FOTO: 33 (09/24/2017); 38 (10/29/2017); 36 (11/21/2017); 40 (12/12/2017); 42 (01/02/2018); 47 (03/03/2018)    Time 6    Period Weeks    Status Achieved      PT LONG TERM GOAL #5   Title Patient will improve bilateral LE strength by at least 1/2 MMT grade to promote ability to perform standing tasks.     Time 6    Period Weeks    Status Achieved      PT LONG TERM GOAL #6   Title Patient will improve her Modified Oswestry Low Back pain disablity questionnaire by at least 10% as a demonstration of improved function.     Baseline 48% (09/24/2017); 46% (10/29/2017), (11/21/2017); 56%  (12/12/2017); 36% (01/02/2018), (03/03/2018)    Time 6    Period Weeks    Status Achieved      PT LONG TERM GOAL #7   Title Pt will report decreased difficulty stepping into and out of her bathtub as well as getting into and out of her truck to promote ability to get into and out of places.     Baseline Pt reports difficulty stepping into and out of her bathtub as well as difficulty getting into and out of her truck (01/02/2018); Pt states the truck and bath tub and  difficult and painful.  (02/05/2018); Improving per subjective reports (03/03/2018); decreased difficulty with truck tranfser after cues for technique and practice, decreasing difficulty with stepping into and out of the bath tub based on subjective reports (03/27/2018 and 04/01/2018); Decreasing difficulty stepping into a bathtub. No difficulty getting into and out of her truck (05/08/2018), (07/22/2018); Difficulty stepping into and out of the bath tub. No difficulty with truck transfers (09/22/2018); Able to get into her bath tub, difficulty getting out (10/27/18); Still has difficulty getting into and out of bathtub (02/23/2019); Better able to step into the bathtub. Still has difficulty stepping out of the bathtub due to knee flexion (03/25/2019); No difficulty stepping into her bath tub. Still has difficulty stepping out due to decreased L knee flexion ROM (05/20/2019), (06/10/2019), (07/22/2019), (09/16/2019)    Time 8    Period Weeks    Status Partially Met    Target Date 11/12/19      PT LONG TERM GOAL #8   Title Patient will improve her BERG balance test score to 46/56 or more as a demonstration of decreased fall risk     Baseline 42/56 (01/02/2018); 54/56 (02/05/2018)    Time 6    Period Weeks    Status Achieved      PT LONG TERM GOAL  #9   TITLE Pt will report minimal to no dizziness related to vertigo to promote balance and decrease fall risk.     Baseline Pt reports dizziness (related to vertigo) which seem to affect her balance  (01/15/2018); Patient reports "a little bit every now and then" (02/05/2018); Pt reports dizziness and decreased balance (01/29/2019); Dizziness has improved some (02/23/2019), (03/25/2019); Still has dizziness and vertigo; does the [place] for both sides instead of just the L side (06/10/2019), Dizziness has decreased. Does Epley for L side (09/16/2019)    Time 8    Period Weeks    Status Partially Met    Target Date 11/12/19      PT LONG TERM GOAL  #10   TITLE Pt will be able to turn 360 degrees to the R and L at least 3 times without dizziness and no LOB to promote safety and decrease fall risk.    Baseline 360 degree turn to the R and L causes a loss of balance (12/17/2018), (01/29/2019). able to perform 3 times with each side with improved balance, minimal dizziness. (02/23/2019); Dizziness (05/20/19), slight dizziness (06/10/2019), (07/22/2019), (09/16/2019)    Time 8    Period Weeks    Status On-going    Target Date 11/12/19      PT LONG TERM GOAL  #11   TITLE Patient will have a decrease in R hip and knee pain to 4/10 or less at worst to promote ability to ambulate with less discomfort.    Baseline 7/10 R hip and knee pain at worst for the past month (03/25/2019); 5/10 R hip pain, 6/10 R knee pain at most for the past 7 days (05/20/19); 5.5/10 R knee pain (05/20/19); 8/10 R hip; 6/10 R knee at most for the past 7 days (06/10/2019); 7/10, easier to decrease pain since starting PT (07/22/2019); 6/10  R hip, and 6/10 R knee at most for the past 7 days (09/16/2019)    Time 8    Period Weeks    Status On-going    Target Date 11/12/19                 Plan - 09/28/19 1755    Clinical  Impression Statement Bilateral lumbar paraspinal muscle tension and decreased fascial mobility palpated. Performed manual therapy to help address and decrease stress to low back. Decreased B lateral thigh pain during gait as well with treatment to stretch lumbar paraspinal muscles and improve glute max muscle activation.  More steadiness observed with sit <> stand transfer when pt distributed her body weight more evenly throughout her feet instead of her heels. Pt will benefit from continued skilled physical therapy services to decrease pain, improve strength and function.    Personal Factors and Comorbidities Age;Comorbidity 3+;Time since onset of injury/illness/exacerbation;Fitness;Past/Current Experience    Comorbidities Back, knee pain, hx of dizziness, hx of back surgery, hx of C-section; memory lapse    Examination-Activity Limitations Squat    Stability/Clinical Decision Making Stable/Uncomplicated    Rehab Potential Fair    Clinical Impairments Affecting Rehab Potential difficulty with carry-over of improvements in clinic, chronicity of condition, age, multiple problem areas; MEMORY lapse    PT Frequency 1x / week    PT Duration 8 weeks    PT Treatment/Interventions Therapeutic activities;Therapeutic exercise;Balance training;Neuromuscular re-education;Patient/family education;Manual techniques;Dry needling;Aquatic Therapy;Electrical Stimulation;Iontophoresis 68m/ml Dexamethasone;Gait training;Canalith Repostioning;Vestibular    PT Next Visit Plan  hip and knee strengthening, core strengthening, patellar mobility, manual techniques, modalities PRN    Consulted and Agree with Plan of Care Patient           Patient will benefit from skilled therapeutic intervention in order to improve the following deficits and impairments:  Pain, Improper body mechanics, Postural dysfunction, Dizziness, Decreased strength, Difficulty walking, Decreased balance  Visit Diagnosis: Muscle weakness (generalized)  Radiculopathy, lumbar region  Pain in right hip     Problem List Patient Active Problem List   Diagnosis Date Noted  . Obese 06/20/2016  . Status post right knee replacement 06/19/2016  . S/P left TKA 06/18/2016  . Spinal stenosis of lumbar region 09/14/2015   MJoneen BoersPT, DPT   09/28/2019, 6:28  PM  [facility] [address] [place] [address]Phone: [phone]  Fax:  [phone] Name: Destiny TSANMRN: [id number] of Birth: [birth year]

## 2019-09-30 ENCOUNTER — Ambulatory Visit: Payer: Medicare Other

## 2019-10-07 ENCOUNTER — Ambulatory Visit: Payer: Medicare Other

## 2019-10-14 ENCOUNTER — Ambulatory Visit: Payer: Medicare Other

## 2019-10-14 ENCOUNTER — Other Ambulatory Visit: Payer: Self-pay

## 2019-10-14 DIAGNOSIS — M6281 Muscle weakness (generalized): Secondary | ICD-10-CM

## 2019-10-14 DIAGNOSIS — M25561 Pain in right knee: Secondary | ICD-10-CM

## 2019-10-14 NOTE — Therapy (Signed)
[facility] [address], [place], [address] Phone: [phone]   Fax:  [phone]  Physical Therapy Treatment  Patient Details  Name: Destiny Shaw MRN: [id number] Date of Birth: [birth year] Referring Provider (PT): Paralee Cancel, MD   Encounter Date: 10/14/2019   PT End of Session - 10/14/19 1311    Visit Number [id number]    Number of Visits 110    Date for PT Re-Evaluation 11/12/19    Authorization Type 2    Authorization Time Period of 10 progress note Medicare    PT Start Time 1311   pt arrived late   PT Stop Time 1346    PT Time Calculation (min) 35 min    Activity Tolerance Patient tolerated treatment well    Behavior During Therapy [facility] for tasks assessed/performed           Past Medical History:  Diagnosis Date  . ADD (attention deficit disorder)   . Anemia   . Anginal pain ([place])    pt states has occas chest pain relates to indigestion; pt uses rest to relieve   . Anxiety   . Arthritis   . Concussion   . Depression   . Diabetes mellitus without complication ([place])   . Dizziness   . Fall   . GERD (gastroesophageal reflux disease)   . Headache   . History of urinary tract infection   . Hyperlipidemia   . Hypertension   . Hypothyroidism   . IBS (irritable bowel syndrome)   . Imbalance   . Numbness    right leg   . Numbness in both hands    comes and goes   . Pneumonia    last episode approx 1 year ago   . Sleep apnea   . Wears glasses     Past Surgical History:  Procedure Laterality Date  . BREAST CYST ASPIRATION Left 1980's   neg  . BREAST CYST EXCISION Right 1980's   neg  . BREAST LUMPECTOMY Right   . BREAST SURGERY    . CARPAL TUNNEL RELEASE    . CESAREAN SECTION     times 2  . COLONOSCOPY WITH PROPOFOL N/A 05/11/2015   Procedure: COLONOSCOPY WITH PROPOFOL;  Surgeon: Manya Silvas, MD;  Location[facility] ENDOSCOPY;  Service: Endoscopy;  Laterality: N/A;  . DE QUERVAIN'S  RELEASE Right   . ESOPHAGOGASTRODUODENOSCOPY (EGD) WITH PROPOFOL N/A 05/11/2015   Procedure: ESOPHAGOGASTRODUODENOSCOPY (EGD) WITH PROPOFOL;  Surgeon: Manya Silvas, MD;  Location[facility] ENDOSCOPY;  Service: Endoscopy;  Laterality: N/A;  . EYE SURGERY     laser surgery bilat   . HERNIA REPAIR    . KNEE ARTHROSCOPY    . LUMBAR LAMINECTOMY/DECOMPRESSION MICRODISCECTOMY Bilateral 09/14/2015   Procedure: MICRO LUMBAR BILATERAL DECOMPRESSION L4 - L5;  Surgeon: Susa Day, MD;  Location: [facility] ORS;  Service: Orthopedics;  Laterality: Bilateral;  . REDUCTION MAMMAPLASTY Bilateral 1980  . RHINOPLASTY    . TONSILLECTOMY    . TOTAL KNEE ARTHROPLASTY Left 06/18/2016   Procedure: LEFT TOTAL KNEE ARTHROPLASTY;  Surgeon: Paralee Cancel, MD;  Location: [facility] ORS;  Service: Orthopedics;  Laterality: Left;  Adductor Block  . TUBAL LIGATION    . UVULOPALATOPHARYNGOPLASTY      There were no vitals filed for this visit.   Subjective Assessment - 10/14/19 1312    Subjective Having R medial knee pain when trying to walk or trying to bend over. 6/10 R medial knee pain during gait.  Patient is accompained by: Family member    Pertinent History Low back pain. Pt states having back surgery about 2-3 years ago. After she had her L TKA, her back started aggravating her due to her walking. Pt also fell about 4 weeks ago onto her L knee. Dr. Alvan Dame checked out her L knee. L knee was fine but has some inflammation.  Pt tries to keep it iced. Pt still recovering for her L TKA but the fall set her back.   Her L knee surgery was last year.  L knee still bothers her a lot.  Had surgery for her back before her L knee surgery. Was doing well until her L knee surgery. The fall made it worse. Feels debilitating. Also has a hard time getting into and out of the car mainly due to her L knee.   Pt fell due to her L knee bucking on her. Pt was trying to pick something up from the side of the couch.  Also has a hard time getting up from the  floor.  Pt states having bladder accidents since after her fall (pt was recommended to tell her MD about it).  Bowel problems since taking medications (was constapated, but after taking medications, pt had diarrhea which was difficult to control. Getting out of the metformin fixed the bowel issue).  Pt states tingling and numbness L lateral LE along the L5 dermatome.  Denies saddle anesthesia.  Pt states that her back problem is mainly on her L side.  Pt landed on her L knee when she fell.  No other falls within the last 6 months. Pt also states being very dizzy since her L knee surgery.  Had PT treatement for her dizziness before which did not help.  The room feels like it is spinning when she gets dizzy.  I feel like it is getting better then she does something.  5/10 low back soreness currently (7/10 at most for the past 7 days after negotiating stairs, 5/10 without doing stairs). 4/10 L knee pain currenglty with gait 5/10 L knee pain at most for the past 7 days.    Patient Stated Goals Be better able to get into and out of her vehicle (midsized truck), into and out of chairs, be better able to roll in her bed with less L knee pain. Be able to get down on the floor and get back up.     Currently in Pain? Yes    Pain Score 6                                      PT Education - 10/14/19 1340    Education Details ther-ex    Person(s) Educated Patient    Methods Explanation;Demonstration;Tactile cues;Verbal cues    Comprehension Returned demonstration;Verbalized understanding          Objectives   MedbridgeAccess Code: [email or web address]  Latex free bands usedif used   Sitting posture: L trunk rotation  Standing posture: slightLlateral shift   Therapeutic exercise  Supine low dye tape R foot to support arch  No change in R knee comfort level during gait   Supine R hip IR stretch with PT 45 seconds to 1 min x 3  Decreased R knee pain during heel strike  of gait  Standing ankle DF/PF on rocker board 2 min with B UE assist   Standing  R gastroc stretch  at wall 30 seconds x 3  Gait multiple times to assess effectiveness of treatment.    Improved exercise technique, movement at target joints, use of target muscles after mod verbal, visual, tactile cues.        Response to treatment Pt tolerated session well without aggravation of symptoms. Decreased R knee pain during gait after treatment to improve gastroc mobility   Clinical impression Pt arrived late and session adjusted accordingly. Pt arrived to clinic with main complain of R knee pain during gait. Decreased R knee pain during stance phase with treatment to decrease gastroc muscle tightness and improving ankle DF. Pt was recommended to perform the standing gastroc stretch home exercise that was previously provided. Pt verbalized understanding. Pt will benefit from continued skilled physical therapy services to decrease pain, improve strength and function.           PT Short Term Goals - 06/10/19 1849      PT SHORT TERM GOAL #1   Title Patient will be independent with her HEP to help decrease back and L knee pain and improve ability to perform functional tasks.     Time 3    Period Weeks    Status On-going    Target Date 07/02/19             PT Long Term Goals - 09/16/19 1343      PT LONG TERM GOAL #1   Title Patient will have a decrease in back pain to 3/10 or less at worst to promote ability to ambulate, turn in bed, perform standing tasks with less pain.     Baseline 6/10 back pain at most (09/24/2017); 7-8/10 at most for the past 7 days, duration of pain is better since starting PT (10/29/2017); 5-6/10 at most for the past 7 days (11/18/2017); 8/10 at most for the past 7 days but the duration of pain is less compared to prior to starting PT (12/12/2017).  7/10 back pain at most for the past 7 days (pt states doing her exercises helps decrease her pain; 01/02/2018);  walking (3/10), standing (4/10), bed mobility (0/10) 02/05/2018; 6/10 back pain at most for the past 7 days (03/03/2018); 6.5/10 back pain at most for the past 7 days (03/27/2018); 7-8/10 at most (05/08/2018); 7/10 back pain at worst (07/22/2018);  7-8/10 at most for the past 7 days (09/22/2018); 5-7/10 at worst for the past 7 days then calms down (10/27/2018); 6/10 back pain at most for the past 7 days (when pt cooks and cleans up in kitchen; 12/17/2018); 6-7/10 back spams pain at most for the past 7 days, usually when she is doing stuff in the kitchen (01/29/2019); 7/10 at worst for the past 7 days (03/25/2019); 7.5/10 (05/20/19); 8/10 low back pain at most for the past 7 days (06/10/2019); 7/10 at worst; Easier to decrease back pain since starting PT (07/22/2019). 4-5/10 at most for the past 7 days (09/16/2019)    Time 8    Period Weeks    Status On-going    Target Date 11/12/19      PT LONG TERM GOAL #2   Title Patient will have a decrease in L knee pain to 3/10 or less at worst to promote ability to ambulate, negotiate stairs, get into and out of a car more comfortably.     Baseline 6/10 L knee pain at most for the past 3 months (09/24/2017); 7-8/10 at most for the past 7 days, duration of pain is better since starting PT (  10/29/2017); 7/10 at most for the past 7 days (11/18/2017); 6/10 L knee pain at most for the past 7 days (12/12/2017); 5/10 L knee pain at most for the past 7 days (pt states doing her exercises helps decrease pain; 01/02/2018); L knee 3/10 (02/05/2018); 4/10 at most for the past 7 days (03/03/2018); 5.5/10 L knee at most for the past 7 days (03/27/2018); 6-7/10 L knee pain at most (05/08/2018); 7/10 L knee pain at worst for the past month (07/22/2018);  6/10 L knee pain at most for the past 7 days (09/22/2018); 5-6/10 at most for the past 7 days sporadically, can get it better with exercise (10/27/2018); 6/10 L knee ache at most for the past 7 days (12/17/2018); 5-6/10 L knee pain at most for the past 7 days  such as going up and down steps (01/29/2019); 5/10 at most for the past 7 days (03/25/2019); 5.5/10 (05/20/19); 4/10 (06/10/2019); 4/10 (07/22/2019); 4/10 L knee pain at most for the past 7 days (09/16/2019)    Time 8    Period Weeks    Status Partially Met    Target Date 11/12/19      PT LONG TERM GOAL #3   Title Patient will improve TUG time to 12 seconds or less as a demonstration of improved functional mobility and balance.     Baseline TUG no AD: 16.05 seconds on average (09/24/2017); 14.3 seconds on average (10/29/2017); 13 seconds average (11/21/2017); 16.67 seconds average (12/12/2017);  14.17 seconds average (01/02/2018); 11.8 sec sonds (02/05/2018)    Time 6    Period Weeks    Status Achieved      PT LONG TERM GOAL #4   Title Patient will improve her back FOTO score by at least 10 points as a demonstration of improved funtion.      Baseline Back FOTO: 33 (09/24/2017); 38 (10/29/2017); 36 (11/21/2017); 40 (12/12/2017); 42 (01/02/2018); 47 (03/03/2018)    Time 6    Period Weeks    Status Achieved      PT LONG TERM GOAL #5   Title Patient will improve bilateral LE strength by at least 1/2 MMT grade to promote ability to perform standing tasks.     Time 6    Period Weeks    Status Achieved      PT LONG TERM GOAL #6   Title Patient will improve her Modified Oswestry Low Back pain disablity questionnaire by at least 10% as a demonstration of improved function.     Baseline 48% (09/24/2017); 46% (10/29/2017), (11/21/2017); 56% (12/12/2017); 36% (01/02/2018), (03/03/2018)    Time 6    Period Weeks    Status Achieved      PT LONG TERM GOAL #7   Title Pt will report decreased difficulty stepping into and out of her bathtub as well as getting into and out of her truck to promote ability to get into and out of places.     Baseline Pt reports difficulty stepping into and out of her bathtub as well as difficulty getting into and out of her truck (01/02/2018); Pt states the truck and bath tub and difficult and  painful.  (02/05/2018); Improving per subjective reports (03/03/2018); decreased difficulty with truck tranfser after cues for technique and practice, decreasing difficulty with stepping into and out of the bath tub based on subjective reports (03/27/2018 and 04/01/2018); Decreasing difficulty stepping into a bathtub. No difficulty getting into and out of her truck (05/08/2018), (07/22/2018); Difficulty stepping into and out of the bath tub.  No difficulty with truck transfers (09/22/2018); Able to get into her bath tub, difficulty getting out (10/27/18); Still has difficulty getting into and out of bathtub (02/23/2019); Better able to step into the bathtub. Still has difficulty stepping out of the bathtub due to knee flexion (03/25/2019); No difficulty stepping into her bath tub. Still has difficulty stepping out due to decreased L knee flexion ROM (05/20/2019), (06/10/2019), (07/22/2019), (09/16/2019)    Time 8    Period Weeks    Status Partially Met    Target Date 11/12/19      PT LONG TERM GOAL #8   Title Patient will improve her BERG balance test score to 46/56 or more as a demonstration of decreased fall risk     Baseline 42/56 (01/02/2018); 54/56 (02/05/2018)    Time 6    Period Weeks    Status Achieved      PT LONG TERM GOAL  #9   TITLE Pt will report minimal to no dizziness related to vertigo to promote balance and decrease fall risk.     Baseline Pt reports dizziness (related to vertigo) which seem to affect her balance (01/15/2018); Patient reports "a little bit every now and then" (02/05/2018); Pt reports dizziness and decreased balance (01/29/2019); Dizziness has improved some (02/23/2019), (03/25/2019); Still has dizziness and vertigo; does the [place] for both sides instead of just the L side (06/10/2019), Dizziness has decreased. Does Epley for L side (09/16/2019)    Time 8    Period Weeks    Status Partially Met    Target Date 11/12/19      PT LONG TERM GOAL  #10   TITLE Pt will be able to turn 360  degrees to the R and L at least 3 times without dizziness and no LOB to promote safety and decrease fall risk.    Baseline 360 degree turn to the R and L causes a loss of balance (12/17/2018), (01/29/2019). able to perform 3 times with each side with improved balance, minimal dizziness. (02/23/2019); Dizziness (05/20/19), slight dizziness (06/10/2019), (07/22/2019), (09/16/2019)    Time 8    Period Weeks    Status On-going    Target Date 11/12/19      PT LONG TERM GOAL  #11   TITLE Patient will have a decrease in R hip and knee pain to 4/10 or less at worst to promote ability to ambulate with less discomfort.    Baseline 7/10 R hip and knee pain at worst for the past month (03/25/2019); 5/10 R hip pain, 6/10 R knee pain at most for the past 7 days (05/20/19); 5.5/10 R knee pain (05/20/19); 8/10 R hip; 6/10 R knee at most for the past 7 days (06/10/2019); 7/10, easier to decrease pain since starting PT (07/22/2019); 6/10  R hip, and 6/10 R knee at most for the past 7 days (09/16/2019)    Time 8    Period Weeks    Status On-going    Target Date 11/12/19                 Plan - 10/14/19 1340    Clinical Impression Statement Pt arrived late and session adjusted accordingly. Pt arrived to clinic with main complain of R knee pain during gait. Decreased R knee pain during stance phase with treatment to decrease gastroc muscle tightness and improving ankle DF. Pt was recommended to perform the standing gastroc stretch home exercise that was previously provided. Pt verbalized understanding. Pt will benefit from continued skilled physical  therapy services to decrease pain, improve strength and function.    Personal Factors and Comorbidities Age;Comorbidity 3+;Time since onset of injury/illness/exacerbation;Fitness;Past/Current Experience    Comorbidities Back, knee pain, hx of dizziness, hx of back surgery, hx of C-section; memory lapse    Examination-Activity Limitations Squat    Stability/Clinical Decision  Making Stable/Uncomplicated    Rehab Potential Fair    Clinical Impairments Affecting Rehab Potential difficulty with carry-over of improvements in clinic, chronicity of condition, age, multiple problem areas; MEMORY lapse    PT Frequency 1x / week    PT Duration 8 weeks    PT Treatment/Interventions Therapeutic activities;Therapeutic exercise;Balance training;Neuromuscular re-education;Patient/family education;Manual techniques;Dry needling;Aquatic Therapy;Electrical Stimulation;Iontophoresis 61m/ml Dexamethasone;Gait training;Canalith Repostioning;Vestibular    PT Next Visit Plan  hip and knee strengthening, core strengthening, patellar mobility, manual techniques, modalities PRN    Consulted and Agree with Plan of Care Patient           Patient will benefit from skilled therapeutic intervention in order to improve the following deficits and impairments:  Pain, Improper body mechanics, Postural dysfunction, Dizziness, Decreased strength, Difficulty walking, Decreased balance  Visit Diagnosis: Muscle weakness (generalized)  Right knee pain, unspecified chronicity     Problem List Patient Active Problem List   Diagnosis Date Noted  . Obese 06/20/2016  . Status post right knee replacement 06/19/2016  . S/P left TKA 06/18/2016  . Spinal stenosis of lumbar region 09/14/2015    MJoneen BoersPT, DPT   10/14/2019, 2:01 PM  [facility] [address] [place] [id number]: [phone]  Fax:  [phone] Name: RJANELL KEELINGMRN: [id number] of Birth: [birth year]

## 2019-10-21 ENCOUNTER — Other Ambulatory Visit: Payer: Self-pay

## 2019-10-21 ENCOUNTER — Ambulatory Visit: Payer: Medicare Other

## 2019-10-21 DIAGNOSIS — M6281 Muscle weakness (generalized): Secondary | ICD-10-CM | POA: Diagnosis not present

## 2019-10-21 DIAGNOSIS — M25551 Pain in right hip: Secondary | ICD-10-CM

## 2019-10-21 DIAGNOSIS — M5416 Radiculopathy, lumbar region: Secondary | ICD-10-CM

## 2019-10-21 DIAGNOSIS — M25561 Pain in right knee: Secondary | ICD-10-CM

## 2019-10-21 NOTE — Therapy (Signed)
[facility] [address], [place], [address] Phone: [phone]   Fax:  [phone]  Physical Therapy Treatment  Patient Details  Name: Destiny Shaw MRN: [id number] Date of Birth: 18-Jan-1951 Referring Provider (PT): Paralee Cancel, MD   Encounter Date: 10/21/2019   PT End of Session - 10/21/19 1300    Visit Number 4    Number of Visits 110    Date for PT Re-Evaluation 11/12/19    Authorization Type 3    Authorization Time Period of 10 progress note Medicare    PT Start Time 1300    PT Stop Time 1345    PT Time Calculation (min) 45 min    Activity Tolerance Patient tolerated treatment well    Behavior During Therapy [facility] for tasks assessed/performed           Past Medical History:  Diagnosis Date  . ADD (attention deficit disorder)   . Anemia   . Anginal pain ([place])    pt states has occas chest pain relates to indigestion; pt uses rest to relieve   . Anxiety   . Arthritis   . Concussion   . Depression   . Diabetes mellitus without complication ([place])   . Dizziness   . Fall   . GERD (gastroesophageal reflux disease)   . Headache   . History of urinary tract infection   . Hyperlipidemia   . Hypertension   . Hypothyroidism   . IBS (irritable bowel syndrome)   . Imbalance   . Numbness    right leg   . Numbness in both hands    comes and goes   . Pneumonia    last episode approx 1 year ago   . Sleep apnea   . Wears glasses     Past Surgical History:  Procedure Laterality Date  . BREAST CYST ASPIRATION Left 1980's   neg  . BREAST CYST EXCISION Right 1980's   neg  . BREAST LUMPECTOMY Right   . BREAST SURGERY    . CARPAL TUNNEL RELEASE    . CESAREAN SECTION     times 2  . COLONOSCOPY WITH PROPOFOL N/A 05/11/2015   Procedure: COLONOSCOPY WITH PROPOFOL;  Surgeon: Manya Silvas, MD;  Location: [facility] ENDOSCOPY;  Service: Endoscopy;  Laterality: N/A;  . DE QUERVAIN'S RELEASE Right   .  ESOPHAGOGASTRODUODENOSCOPY (EGD) WITH PROPOFOL N/A 05/11/2015   Procedure: ESOPHAGOGASTRODUODENOSCOPY (EGD) WITH PROPOFOL;  Surgeon: Manya Silvas, MD;  Location: [facility] ENDOSCOPY;  Service: Endoscopy;  Laterality: N/A;  . EYE SURGERY     laser surgery bilat   . HERNIA REPAIR    . KNEE ARTHROSCOPY    . LUMBAR LAMINECTOMY/DECOMPRESSION MICRODISCECTOMY Bilateral 09/14/2015   Procedure: MICRO LUMBAR BILATERAL DECOMPRESSION L4 - L5;  Surgeon: Susa Day, MD;  Location: [facility] ORS;  Service: Orthopedics;  Laterality: Bilateral;  . REDUCTION MAMMAPLASTY Bilateral 1980  . RHINOPLASTY    . TONSILLECTOMY    . TOTAL KNEE ARTHROPLASTY Left 06/18/2016   Procedure: LEFT TOTAL KNEE ARTHROPLASTY;  Surgeon: Paralee Cancel, MD;  Location: [facility] ORS;  Service: Orthopedics;  Laterality: Left;  Adductor Block  . TUBAL LIGATION    . UVULOPALATOPHARYNGOPLASTY      There were no vitals filed for this visit.   Subjective Assessment - 10/21/19 1301    Subjective R LE has been pretty tough. R thigh, knee and leg has been bothering her. Also feels a tight R calf muscle. R knee bothers  her the most. 8/10 R knee joint pain during gait.    Patient is accompained by: Family member    Pertinent History Low back pain. Pt states having back surgery about 2-3 years ago. After she had her L TKA, her back started aggravating her due to her walking. Pt also fell about 4 weeks ago onto her L knee. Dr. Alvan Dame checked out her L knee. L knee was fine but has some inflammation.  Pt tries to keep it iced. Pt still recovering for her L TKA but the fall set her back.   Her L knee surgery was last year.  L knee still bothers her a lot.  Had surgery for her back before her L knee surgery. Was doing well until her L knee surgery. The fall made it worse. Feels debilitating. Also has a hard time getting into and out of the car mainly due to her L knee.   Pt fell due to her L knee bucking on her. Pt was trying to pick something up from the side of the  couch.  Also has a hard time getting up from the floor.  Pt states having bladder accidents since after her fall (pt was recommended to tell her MD about it).  Bowel problems since taking medications (was constapated, but after taking medications, pt had diarrhea which was difficult to control. Getting out of the metformin fixed the bowel issue).  Pt states tingling and numbness L lateral LE along the L5 dermatome.  Denies saddle anesthesia.  Pt states that her back problem is mainly on her L side.  Pt landed on her L knee when she fell.  No other falls within the last 6 months. Pt also states being very dizzy since her L knee surgery.  Had PT treatement for her dizziness before which did not help.  The room feels like it is spinning when she gets dizzy.  I feel like it is getting better then she does something.  5/10 low back soreness currently (7/10 at most for the past 7 days after negotiating stairs, 5/10 without doing stairs). 4/10 L knee pain currenglty with gait 5/10 L knee pain at most for the past 7 days.    Patient Stated Goals Be better able to get into and out of her vehicle (midsized truck), into and out of chairs, be better able to roll in her bed with less L knee pain. Be able to get down on the floor and get back up.     Currently in Pain? Yes    Pain Score 8                                      PT Education - 10/21/19 1305    Education Details ther-ex    Person(s) Educated Patient    Methods Explanation;Demonstration;Tactile cues;Verbal cues    Comprehension Returned demonstration;Verbalized understanding          Objectives   MedbridgeAccess Code: [email or web address]  Latex free bands usedif used   Sitting posture: L trunk rotation  Standing posture: slightLlateral shift   Therapeutic exercise   Standing  R gastroc stretch at wall 30 seconds x 3  Standing ankle DF/PF on rocker board 2 min with B UE assist    No change in R knee  joint pain during gait  Seated manually resisted L upper trunk rotation isometrics in neutral to promote R lower  trunk rotation to promote more neutral posture  10x4 with 5 second holds  Decreased low back pain. Decreased R gastroc tightness afterwards   Decreased R LE pain during gait afterwards  Seated R hip extension isometrics 10x5 seconds for 3 sets  Decreased R lumbar lateral shift posture, more neutral lumbar posture observed during exercise.    Decreased R medial knee pain, increased R lateral knee pain. Decreased low back pain.   Seated hip adduction ball and glute max squeeze 10x10 seconds for 2 sets  Decreased R knee joint pain during gait afterwards  Seated R knee flexion targeting medial hamstrings red band 10x3  Seated R tibia IR 10x3 to promote medial hamstrings strengthening  Seated R knee extension 10x5 seconds for 3 sets  R lateral hamstrings stretch felt.   Decreased R LE pain during gait afterwards.   Gait multiple times to assess effectiveness of treatment.    Improved exercise technique, movement at target joints, use of target muscles after min to mod verbal, visual, tactile cues.        Response to treatment Pt tolerated session well without aggravation of symptoms.  Decreased R knee joint pain to 5/10 after session during gait  Pt states she feels better after session.    Clinical impression Decreased low back and R LE pain during gait with treatment to decrease L lumbar rotation and R lumbar lateral shift posture, Decreased R knee joint pain with treatment to decrease R lateral hamstrings tension to promote more neutral knee joint position. Pt will benefit from continued skilled physical therapy services to continue to decrease pain, improve strength, and function. Challenges to progress include chronicity of condition, multiple areas of pain, as well as possible memory involvement.       PT Short Term Goals - 06/10/19 1849      PT  SHORT TERM GOAL #1   Title Patient will be independent with her HEP to help decrease back and L knee pain and improve ability to perform functional tasks.     Time 3    Period Weeks    Status On-going    Target Date 07/02/19             PT Long Term Goals - 09/16/19 1343      PT LONG TERM GOAL #1   Title Patient will have a decrease in back pain to 3/10 or less at worst to promote ability to ambulate, turn in bed, perform standing tasks with less pain.     Baseline 6/10 back pain at most (09/24/2017); 7-8/10 at most for the past 7 days, duration of pain is better since starting PT (10/29/2017); 5-6/10 at most for the past 7 days (11/18/2017); 8/10 at most for the past 7 days but the duration of pain is less compared to prior to starting PT (12/12/2017).  7/10 back pain at most for the past 7 days (pt states doing her exercises helps decrease her pain; 01/02/2018); walking (3/10), standing (4/10), bed mobility (0/10) 02/05/2018; 6/10 back pain at most for the past 7 days (03/03/2018); 6.5/10 back pain at most for the past 7 days (03/27/2018); 7-8/10 at most (05/08/2018); 7/10 back pain at worst (07/22/2018);  7-8/10 at most for the past 7 days (09/22/2018); 5-7/10 at worst for the past 7 days then calms down (10/27/2018); 6/10 back pain at most for the past 7 days (when pt cooks and cleans up in kitchen; 12/17/2018); 6-7/10 back spams pain at most for the past 7 days,  usually when she is doing stuff in the kitchen (01/29/2019); 7/10 at worst for the past 7 days (03/25/2019); 7.5/10 (05/20/19); 8/10 low back pain at most for the past 7 days (06/10/2019); 7/10 at worst; Easier to decrease back pain since starting PT (07/22/2019). 4-5/10 at most for the past 7 days (09/16/2019)    Time 8    Period Weeks    Status On-going    Target Date 11/12/19      PT LONG TERM GOAL #2   Title Patient will have a decrease in L knee pain to 3/10 or less at worst to promote ability to ambulate, negotiate stairs, get into and out of  a car more comfortably.     Baseline 6/10 L knee pain at most for the past 3 months (09/24/2017); 7-8/10 at most for the past 7 days, duration of pain is better since starting PT (10/29/2017); 7/10 at most for the past 7 days (11/18/2017); 6/10 L knee pain at most for the past 7 days (12/12/2017); 5/10 L knee pain at most for the past 7 days (pt states doing her exercises helps decrease pain; 01/02/2018); L knee 3/10 (02/05/2018); 4/10 at most for the past 7 days (03/03/2018); 5.5/10 L knee at most for the past 7 days (03/27/2018); 6-7/10 L knee pain at most (05/08/2018); 7/10 L knee pain at worst for the past month (07/22/2018);  6/10 L knee pain at most for the past 7 days (09/22/2018); 5-6/10 at most for the past 7 days sporadically, can get it better with exercise (10/27/2018); 6/10 L knee ache at most for the past 7 days (12/17/2018); 5-6/10 L knee pain at most for the past 7 days such as going up and down steps (01/29/2019); 5/10 at most for the past 7 days (03/25/2019); 5.5/10 (05/20/19); 4/10 (06/10/2019); 4/10 (07/22/2019); 4/10 L knee pain at most for the past 7 days (09/16/2019)    Time 8    Period Weeks    Status Partially Met    Target Date 11/12/19      PT LONG TERM GOAL #3   Title Patient will improve TUG time to 12 seconds or less as a demonstration of improved functional mobility and balance.     Baseline TUG no AD: 16.05 seconds on average (09/24/2017); 14.3 seconds on average (10/29/2017); 13 seconds average (11/21/2017); 16.67 seconds average (12/12/2017);  14.17 seconds average (01/02/2018); 11.8 sec sonds (02/05/2018)    Time 6    Period Weeks    Status Achieved      PT LONG TERM GOAL #4   Title Patient will improve her back FOTO score by at least 10 points as a demonstration of improved funtion.      Baseline Back FOTO: 33 (09/24/2017); 38 (10/29/2017); 36 (11/21/2017); 40 (12/12/2017); 42 (01/02/2018); 47 (03/03/2018)    Time 6    Period Weeks    Status Achieved      PT LONG TERM GOAL #5   Title  Patient will improve bilateral LE strength by at least 1/2 MMT grade to promote ability to perform standing tasks.     Time 6    Period Weeks    Status Achieved      PT LONG TERM GOAL #6   Title Patient will improve her Modified Oswestry Low Back pain disablity questionnaire by at least 10% as a demonstration of improved function.     Baseline 48% (09/24/2017); 46% (10/29/2017), (11/21/2017); 56% (12/12/2017); 36% (01/02/2018), (03/03/2018)    Time 6  Period Weeks    Status Achieved      PT LONG TERM GOAL #7   Title Pt will report decreased difficulty stepping into and out of her bathtub as well as getting into and out of her truck to promote ability to get into and out of places.     Baseline Pt reports difficulty stepping into and out of her bathtub as well as difficulty getting into and out of her truck (01/02/2018); Pt states the truck and bath tub and difficult and painful.  (02/05/2018); Improving per subjective reports (03/03/2018); decreased difficulty with truck tranfser after cues for technique and practice, decreasing difficulty with stepping into and out of the bath tub based on subjective reports (03/27/2018 and 04/01/2018); Decreasing difficulty stepping into a bathtub. No difficulty getting into and out of her truck (05/08/2018), (07/22/2018); Difficulty stepping into and out of the bath tub. No difficulty with truck transfers (09/22/2018); Able to get into her bath tub, difficulty getting out (10/27/18); Still has difficulty getting into and out of bathtub (02/23/2019); Better able to step into the bathtub. Still has difficulty stepping out of the bathtub due to knee flexion (03/25/2019); No difficulty stepping into her bath tub. Still has difficulty stepping out due to decreased L knee flexion ROM (05/20/2019), (06/10/2019), (07/22/2019), (09/16/2019)    Time 8    Period Weeks    Status Partially Met    Target Date 11/12/19      PT LONG TERM GOAL #8   Title Patient will improve her BERG balance  test score to 46/56 or more as a demonstration of decreased fall risk     Baseline 42/56 (01/02/2018); 54/56 (02/05/2018)    Time 6    Period Weeks    Status Achieved      PT LONG TERM GOAL  #9   TITLE Pt will report minimal to no dizziness related to vertigo to promote balance and decrease fall risk.     Baseline Pt reports dizziness (related to vertigo) which seem to affect her balance (01/15/2018); Patient reports "a little bit every now and then" (02/05/2018); Pt reports dizziness and decreased balance (01/29/2019); Dizziness has improved some (02/23/2019), (03/25/2019); Still has dizziness and vertigo; does the [place] for both sides instead of just the L side (06/10/2019), Dizziness has decreased. Does Epley for L side (09/16/2019)    Time 8    Period Weeks    Status Partially Met    Target Date 11/12/19      PT LONG TERM GOAL  #10   TITLE Pt will be able to turn 360 degrees to the R and L at least 3 times without dizziness and no LOB to promote safety and decrease fall risk.    Baseline 360 degree turn to the R and L causes a loss of balance (12/17/2018), (01/29/2019). able to perform 3 times with each side with improved balance, minimal dizziness. (02/23/2019); Dizziness (05/20/19), slight dizziness (06/10/2019), (07/22/2019), (09/16/2019)    Time 8    Period Weeks    Status On-going    Target Date 11/12/19      PT LONG TERM GOAL  #11   TITLE Patient will have a decrease in R hip and knee pain to 4/10 or less at worst to promote ability to ambulate with less discomfort.    Baseline 7/10 R hip and knee pain at worst for the past month (03/25/2019); 5/10 R hip pain, 6/10 R knee pain at most for the past 7 days (05/20/19); 5.5/10 R  knee pain (05/20/19); 8/10 R hip; 6/10 R knee at most for the past 7 days (06/10/2019); 7/10, easier to decrease pain since starting PT (07/22/2019); 6/10  R hip, and 6/10 R knee at most for the past 7 days (09/16/2019)    Time 8    Period Weeks    Status On-going    Target  Date 11/12/19                 Plan - 10/21/19 1304    Clinical Impression Statement Decreased low back and R LE pain during gait with treatment to decrease L lumbar rotation and R lumbar lateral shift posture, Decreased R knee joint pain with treatment to decrease R lateral hamstrings tension to promote more neutral knee joint position. Pt will benefit from continued skilled physical therapy services to continue to decrease pain, improve strength, and function. Challenges to progress include chronicity of condition, multiple areas of pain, as well as possible memory involvement.    Personal Factors and Comorbidities Age;Comorbidity 3+;Time since onset of injury/illness/exacerbation;Fitness;Past/Current Experience    Comorbidities Back, knee pain, hx of dizziness, hx of back surgery, hx of C-section; memory lapse    Examination-Activity Limitations Squat    Stability/Clinical Decision Making Stable/Uncomplicated    Rehab Potential Fair    Clinical Impairments Affecting Rehab Potential difficulty with carry-over of improvements in clinic, chronicity of condition, age, multiple problem areas; MEMORY lapse    PT Frequency 1x / week    PT Duration 8 weeks    PT Treatment/Interventions Therapeutic activities;Therapeutic exercise;Balance training;Neuromuscular re-education;Patient/family education;Manual techniques;Dry needling;Aquatic Therapy;Electrical Stimulation;Iontophoresis 56m/ml Dexamethasone;Gait training;Canalith Repostioning;Vestibular    PT Next Visit Plan  hip and knee strengthening, core strengthening, patellar mobility, manual techniques, modalities PRN    Consulted and Agree with Plan of Care Patient           Patient will benefit from skilled therapeutic intervention in order to improve the following deficits and impairments:  Pain, Improper body mechanics, Postural dysfunction, Dizziness, Decreased strength, Difficulty walking, Decreased balance  Visit Diagnosis: Muscle  weakness (generalized)  Right knee pain, unspecified chronicity  Radiculopathy, lumbar region  Pain in right hip     Problem List Patient Active Problem List   Diagnosis Date Noted  . Obese 06/20/2016  . Status post right knee replacement 06/19/2016  . S/P left TKA 06/18/2016  . Spinal stenosis of lumbar region 09/14/2015    MJoneen BoersPT, DPT   10/21/2019, 2:05 PM  [facility] [address] [place] [address]Phone: [phone]  Fax:  [phone] Name: Destiny CHAMBERLINMRN: [id number] of Birth: [birth year]

## 2019-10-28 ENCOUNTER — Other Ambulatory Visit: Payer: Self-pay

## 2019-10-28 ENCOUNTER — Ambulatory Visit: Payer: Medicare Other | Attending: Orthopedic Surgery

## 2019-10-28 DIAGNOSIS — M6281 Muscle weakness (generalized): Secondary | ICD-10-CM | POA: Diagnosis not present

## 2019-10-28 DIAGNOSIS — M25551 Pain in right hip: Secondary | ICD-10-CM | POA: Insufficient documentation

## 2019-10-28 DIAGNOSIS — R42 Dizziness and giddiness: Secondary | ICD-10-CM | POA: Insufficient documentation

## 2019-10-28 DIAGNOSIS — G8929 Other chronic pain: Secondary | ICD-10-CM | POA: Insufficient documentation

## 2019-10-28 DIAGNOSIS — M25561 Pain in right knee: Secondary | ICD-10-CM | POA: Insufficient documentation

## 2019-10-28 DIAGNOSIS — M5416 Radiculopathy, lumbar region: Secondary | ICD-10-CM | POA: Insufficient documentation

## 2019-10-28 DIAGNOSIS — Z9181 History of falling: Secondary | ICD-10-CM | POA: Diagnosis present

## 2019-10-28 DIAGNOSIS — M25562 Pain in left knee: Secondary | ICD-10-CM | POA: Insufficient documentation

## 2019-10-28 NOTE — Therapy (Signed)
[facility] [address], [place], [address] Phone: [phone]   Fax:  [phone]  Physical Therapy Treatment  Patient Details  Name: Destiny Shaw MRN: [id number] Date of Birth: [birth year] Referring Provider (PT): Paralee Cancel, MD   Encounter Date: 10/28/2019   PT End of Session - 10/28/19 1305    Visit Number 41    Number of Visits 110    Date for PT Re-Evaluation 11/12/19    Authorization Type 4    Authorization Time Period of 10 progress note Medicare    PT Start Time 1304    PT Stop Time 1348    PT Time Calculation (min) 44 min    Activity Tolerance Patient tolerated treatment well    Behavior During Therapy [facility] for tasks assessed/performed           Past Medical History:  Diagnosis Date  . ADD (attention deficit disorder)   . Anemia   . Anginal pain ([place])    pt states has occas chest pain relates to indigestion; pt uses rest to relieve   . Anxiety   . Arthritis   . Concussion   . Depression   . Diabetes mellitus without complication ([place])   . Dizziness   . Fall   . GERD (gastroesophageal reflux disease)   . Headache   . History of urinary tract infection   . Hyperlipidemia   . Hypertension   . Hypothyroidism   . IBS (irritable bowel syndrome)   . Imbalance   . Numbness    right leg   . Numbness in both hands    comes and goes   . Pneumonia    last episode approx 1 year ago   . Sleep apnea   . Wears glasses     Past Surgical History:  Procedure Laterality Date  . BREAST CYST ASPIRATION Left 1980's   neg  . BREAST CYST EXCISION Right 1980's   neg  . BREAST LUMPECTOMY Right   . BREAST SURGERY    . CARPAL TUNNEL RELEASE    . CESAREAN SECTION     times 2  . COLONOSCOPY WITH PROPOFOL N/A 05/11/2015   Procedure: COLONOSCOPY WITH PROPOFOL;  Surgeon: Manya Silvas, MD;  Location: [facility] ENDOSCOPY;  Service: Endoscopy;  Laterality: N/A;  . DE QUERVAIN'S RELEASE Right   .  ESOPHAGOGASTRODUODENOSCOPY (EGD) WITH PROPOFOL N/A 05/11/2015   Procedure: ESOPHAGOGASTRODUODENOSCOPY (EGD) WITH PROPOFOL;  Surgeon: Manya Silvas, MD;  Location: [facility] ENDOSCOPY;  Service: Endoscopy;  Laterality: N/A;  . EYE SURGERY     laser surgery bilat   . HERNIA REPAIR    . KNEE ARTHROSCOPY    . LUMBAR LAMINECTOMY/DECOMPRESSION MICRODISCECTOMY Bilateral 09/14/2015   Procedure: MICRO LUMBAR BILATERAL DECOMPRESSION L4 - L5;  Surgeon: Susa Day, MD;  Location: [facility] ORS;  Service: Orthopedics;  Laterality: Bilateral;  . REDUCTION MAMMAPLASTY Bilateral 1980  . RHINOPLASTY    . TONSILLECTOMY    . TOTAL KNEE ARTHROPLASTY Left 06/18/2016   Procedure: LEFT TOTAL KNEE ARTHROPLASTY;  Surgeon: Paralee Cancel, MD;  Location: [facility] ORS;  Service: Orthopedics;  Laterality: Left;  Adductor Block  . TUBAL LIGATION    . UVULOPALATOPHARYNGOPLASTY      There were no vitals filed for this visit.   Subjective Assessment - 10/28/19 1306    Subjective R medial knee has been bothering her off and on all day. Low back has been getting catches. 7.5/10 R knee pain, 6/10 low back  pain currently.  L knee is alright very seldom does it feel uncomfortable.    Patient is accompained by: Family member    Pertinent History Low back pain. Pt states having back surgery about 2-3 years ago. After she had her L TKA, her back started aggravating her due to her walking. Pt also fell about 4 weeks ago onto her L knee. Dr. Alvan Dame checked out her L knee. L knee was fine but has some inflammation.  Pt tries to keep it iced. Pt still recovering for her L TKA but the fall set her back.   Her L knee surgery was last year.  L knee still bothers her a lot.  Had surgery for her back before her L knee surgery. Was doing well until her L knee surgery. The fall made it worse. Feels debilitating. Also has a hard time getting into and out of the car mainly due to her L knee.   Pt fell due to her L knee bucking on her. Pt was trying to pick something  up from the side of the couch.  Also has a hard time getting up from the floor.  Pt states having bladder accidents since after her fall (pt was recommended to tell her MD about it).  Bowel problems since taking medications (was constapated, but after taking medications, pt had diarrhea which was difficult to control. Getting out of the metformin fixed the bowel issue).  Pt states tingling and numbness L lateral LE along the L5 dermatome.  Denies saddle anesthesia.  Pt states that her back problem is mainly on her L side.  Pt landed on her L knee when she fell.  No other falls within the last 6 months. Pt also states being very dizzy since her L knee surgery.  Had PT treatement for her dizziness before which did not help.  The room feels like it is spinning when she gets dizzy.  I feel like it is getting better then she does something.  5/10 low back soreness currently (7/10 at most for the past 7 days after negotiating stairs, 5/10 without doing stairs). 4/10 L knee pain currenglty with gait 5/10 L knee pain at most for the past 7 days.    Patient Stated Goals Be better able to get into and out of her vehicle (midsized truck), into and out of chairs, be better able to roll in her bed with less L knee pain. Be able to get down on the floor and get back up.     Currently in Pain? Yes    Pain Score 7                                      PT Education - 10/28/19 1312    Education Details ther-ex, HEP    Person(s) Educated Patient    Methods Explanation;Demonstration;Tactile cues;Verbal cues;Handout    Comprehension Returned demonstration;Verbalized understanding          Objectives   MedbridgeAccess Code: [email or web address]  Latex free bands usedif used   Sitting posture: L trunk rotation  Standing posture: slightLlateral shift   Therapeutic exercise   Seated manually resisted L upper trunk rotation isometrics in neutral to promote R lower trunk  rotation to promote more neutral posture             10x4 with 5 second holds  Decreased low back pain.             Decreased R LE pain during gait afterwards  Seated manually resisted L lateral shift isometrics in neutral 10x5 seconds for 3 sets  Seated R hip IR 10x3 with 5 second holds   Seated R hip ER 10x3 with 5 second holds  Decreased R knee joint pain during gait  Seated hip adduction ball and glute max squeeze 10x10 seconds  PNF chops to the L to promote R low back rotation red double band 10x5 seconds   Red band difficult. Yellow band provided for HEP.   Seated R hip extension isometrics 10x5 seconds for 2 sets  Decreased R knee pain with gait.   Gait multiple times to assess effectiveness of treatment.   Improved exercise technique, movement at target joints, use of target muscles after min to mod verbal, visual, tactile cues.   Response to treatment Pt tolerated session well without aggravation of symptoms. Pt states she feels better after session.    Clinical impression Decreased R knee and back pain with treatment to promote hip extension strength, R lumbar rotation and decrease R lower lumbar lateral shift posture. Home exercises provided to help continue progress. Pt will benefit from continued skilled physical therapy services to decrease pain, improve strength and function.       PT Short Term Goals - 06/10/19 1849      PT SHORT TERM GOAL #1   Title Patient will be independent with her HEP to help decrease back and L knee pain and improve ability to perform functional tasks.     Time 3    Period Weeks    Status On-going    Target Date 07/02/19             PT Long Term Goals - 09/16/19 1343      PT LONG TERM GOAL #1   Title Patient will have a decrease in back pain to 3/10 or less at worst to promote ability to ambulate, turn in bed, perform standing tasks with less pain.     Baseline 6/10 back pain at most (09/24/2017);  7-8/10 at most for the past 7 days, duration of pain is better since starting PT (10/29/2017); 5-6/10 at most for the past 7 days (11/18/2017); 8/10 at most for the past 7 days but the duration of pain is less compared to prior to starting PT (12/12/2017).  7/10 back pain at most for the past 7 days (pt states doing her exercises helps decrease her pain; 01/02/2018); walking (3/10), standing (4/10), bed mobility (0/10) 02/05/2018; 6/10 back pain at most for the past 7 days (03/03/2018); 6.5/10 back pain at most for the past 7 days (03/27/2018); 7-8/10 at most (05/08/2018); 7/10 back pain at worst (07/22/2018);  7-8/10 at most for the past 7 days (09/22/2018); 5-7/10 at worst for the past 7 days then calms down (10/27/2018); 6/10 back pain at most for the past 7 days (when pt cooks and cleans up in kitchen; 12/17/2018); 6-7/10 back spams pain at most for the past 7 days, usually when she is doing stuff in the kitchen (01/29/2019); 7/10 at worst for the past 7 days (03/25/2019); 7.5/10 (05/20/19); 8/10 low back pain at most for the past 7 days (06/10/2019); 7/10 at worst; Easier to decrease back pain since starting PT (07/22/2019). 4-5/10 at most for the past 7 days (09/16/2019)    Time 8    Period Weeks    Status On-going  Target Date 11/12/19      PT LONG TERM GOAL #2   Title Patient will have a decrease in L knee pain to 3/10 or less at worst to promote ability to ambulate, negotiate stairs, get into and out of a car more comfortably.     Baseline 6/10 L knee pain at most for the past 3 months (09/24/2017); 7-8/10 at most for the past 7 days, duration of pain is better since starting PT (10/29/2017); 7/10 at most for the past 7 days (11/18/2017); 6/10 L knee pain at most for the past 7 days (12/12/2017); 5/10 L knee pain at most for the past 7 days (pt states doing her exercises helps decrease pain; 01/02/2018); L knee 3/10 (02/05/2018); 4/10 at most for the past 7 days (03/03/2018); 5.5/10 L knee at most for the past 7 days  (03/27/2018); 6-7/10 L knee pain at most (05/08/2018); 7/10 L knee pain at worst for the past month (07/22/2018);  6/10 L knee pain at most for the past 7 days (09/22/2018); 5-6/10 at most for the past 7 days sporadically, can get it better with exercise (10/27/2018); 6/10 L knee ache at most for the past 7 days (12/17/2018); 5-6/10 L knee pain at most for the past 7 days such as going up and down steps (01/29/2019); 5/10 at most for the past 7 days (03/25/2019); 5.5/10 (05/20/19); 4/10 (06/10/2019); 4/10 (07/22/2019); 4/10 L knee pain at most for the past 7 days (09/16/2019)    Time 8    Period Weeks    Status Partially Met    Target Date 11/12/19      PT LONG TERM GOAL #3   Title Patient will improve TUG time to 12 seconds or less as a demonstration of improved functional mobility and balance.     Baseline TUG no AD: 16.05 seconds on average (09/24/2017); 14.3 seconds on average (10/29/2017); 13 seconds average (11/21/2017); 16.67 seconds average (12/12/2017);  14.17 seconds average (01/02/2018); 11.8 sec sonds (02/05/2018)    Time 6    Period Weeks    Status Achieved      PT LONG TERM GOAL #4   Title Patient will improve her back FOTO score by at least 10 points as a demonstration of improved funtion.      Baseline Back FOTO: 33 (09/24/2017); 38 (10/29/2017); 36 (11/21/2017); 40 (12/12/2017); 42 (01/02/2018); 47 (03/03/2018)    Time 6    Period Weeks    Status Achieved      PT LONG TERM GOAL #5   Title Patient will improve bilateral LE strength by at least 1/2 MMT grade to promote ability to perform standing tasks.     Time 6    Period Weeks    Status Achieved      PT LONG TERM GOAL #6   Title Patient will improve her Modified Oswestry Low Back pain disablity questionnaire by at least 10% as a demonstration of improved function.     Baseline 48% (09/24/2017); 46% (10/29/2017), (11/21/2017); 56% (12/12/2017); 36% (01/02/2018), (03/03/2018)    Time 6    Period Weeks    Status Achieved      PT LONG TERM GOAL #7    Title Pt will report decreased difficulty stepping into and out of her bathtub as well as getting into and out of her truck to promote ability to get into and out of places.     Baseline Pt reports difficulty stepping into and out of her bathtub as well as difficulty getting  into and out of her truck (01/02/2018); Pt states the truck and bath tub and difficult and painful.  (02/05/2018); Improving per subjective reports (03/03/2018); decreased difficulty with truck tranfser after cues for technique and practice, decreasing difficulty with stepping into and out of the bath tub based on subjective reports (03/27/2018 and 04/01/2018); Decreasing difficulty stepping into a bathtub. No difficulty getting into and out of her truck (05/08/2018), (07/22/2018); Difficulty stepping into and out of the bath tub. No difficulty with truck transfers (09/22/2018); Able to get into her bath tub, difficulty getting out (10/27/18); Still has difficulty getting into and out of bathtub (02/23/2019); Better able to step into the bathtub. Still has difficulty stepping out of the bathtub due to knee flexion (03/25/2019); No difficulty stepping into her bath tub. Still has difficulty stepping out due to decreased L knee flexion ROM (05/20/2019), (06/10/2019), (07/22/2019), (09/16/2019)    Time 8    Period Weeks    Status Partially Met    Target Date 11/12/19      PT LONG TERM GOAL #8   Title Patient will improve her BERG balance test score to 46/56 or more as a demonstration of decreased fall risk     Baseline 42/56 (01/02/2018); 54/56 (02/05/2018)    Time 6    Period Weeks    Status Achieved      PT LONG TERM GOAL  #9   TITLE Pt will report minimal to no dizziness related to vertigo to promote balance and decrease fall risk.     Baseline Pt reports dizziness (related to vertigo) which seem to affect her balance (01/15/2018); Patient reports "a little bit every now and then" (02/05/2018); Pt reports dizziness and decreased balance  (01/29/2019); Dizziness has improved some (02/23/2019), (03/25/2019); Still has dizziness and vertigo; does the [place] for both sides instead of just the L side (06/10/2019), Dizziness has decreased. Does Epley for L side (09/16/2019)    Time 8    Period Weeks    Status Partially Met    Target Date 11/12/19      PT LONG TERM GOAL  #10   TITLE Pt will be able to turn 360 degrees to the R and L at least 3 times without dizziness and no LOB to promote safety and decrease fall risk.    Baseline 360 degree turn to the R and L causes a loss of balance (12/17/2018), (01/29/2019). able to perform 3 times with each side with improved balance, minimal dizziness. (02/23/2019); Dizziness (05/20/19), slight dizziness (06/10/2019), (07/22/2019), (09/16/2019)    Time 8    Period Weeks    Status On-going    Target Date 11/12/19      PT LONG TERM GOAL  #11   TITLE Patient will have a decrease in R hip and knee pain to 4/10 or less at worst to promote ability to ambulate with less discomfort.    Baseline 7/10 R hip and knee pain at worst for the past month (03/25/2019); 5/10 R hip pain, 6/10 R knee pain at most for the past 7 days (05/20/19); 5.5/10 R knee pain (05/20/19); 8/10 R hip; 6/10 R knee at most for the past 7 days (06/10/2019); 7/10, easier to decrease pain since starting PT (07/22/2019); 6/10  R hip, and 6/10 R knee at most for the past 7 days (09/16/2019)    Time 8    Period Weeks    Status On-going    Target Date 11/12/19  Plan - 10/28/19 1312    Clinical Impression Statement Decreased R knee and back pain with treatment to promote hip extension strength, R lumbar rotation and decrease R lower lumbar lateral shift posture. Home exercises provided to help continue progress. Pt will benefit from continued skilled physical therapy services to decrease pain, improve strength and function.    Personal Factors and Comorbidities Age;Comorbidity 3+;Time since onset of  injury/illness/exacerbation;Fitness;Past/Current Experience    Comorbidities Back, knee pain, hx of dizziness, hx of back surgery, hx of C-section; memory lapse    Examination-Activity Limitations Squat    Stability/Clinical Decision Making Stable/Uncomplicated    Rehab Potential Fair    Clinical Impairments Affecting Rehab Potential difficulty with carry-over of improvements in clinic, chronicity of condition, age, multiple problem areas; MEMORY lapse    PT Frequency 1x / week    PT Duration 8 weeks    PT Treatment/Interventions Therapeutic activities;Therapeutic exercise;Balance training;Neuromuscular re-education;Patient/family education;Manual techniques;Dry needling;Aquatic Therapy;Electrical Stimulation;Iontophoresis 90m/ml Dexamethasone;Gait training;Canalith Repostioning;Vestibular    PT Next Visit Plan  hip and knee strengthening, core strengthening, patellar mobility, manual techniques, modalities PRN    Consulted and Agree with Plan of Care Patient           Patient will benefit from skilled therapeutic intervention in order to improve the following deficits and impairments:  Pain, Improper body mechanics, Postural dysfunction, Dizziness, Decreased strength, Difficulty walking, Decreased balance  Visit Diagnosis: Muscle weakness (generalized)  Right knee pain, unspecified chronicity  Radiculopathy, lumbar region  History of falling     Problem List Patient Active Problem List   Diagnosis Date Noted  . Obese 06/20/2016  . Status post right knee replacement 06/19/2016  . S/P left TKA 06/18/2016  . Spinal stenosis of lumbar region 09/14/2015    MJoneen BoersPT, DPT   10/28/2019, 3:56 PM  [facility] [address] [place] [address]Phone: [phone]  Fax:  [phone] Name: Destiny DOSANJHMRN: [id number] of Birth: 409-23-1952

## 2019-10-28 NOTE — Patient Instructions (Addendum)
Access Code: [email or web address] URL: [email or web address] Date: 10/28/2019 Prepared by: Joneen Boers  Exercises Seated Hip Adduction Isometrics with Diona Foley - 1 x daily - 7 x weekly - 10 reps - 2 sets - 5 seconds hold Seated Gluteal Sets - 1 x daily - 7 x weekly - 10 reps - 3 sets - 5 seconds hold Standing Bilateral Gastroc Stretch with Step - 2 x daily - 7 x weekly - 5 reps - 1 sets - 20 seconds hold Seated Long Arc Quad with Ankle Weight - 1 x daily - 7 x weekly - 10 reps - 3 sets - 5 seconds hold Bent Knee Fallouts - 1 x daily - 7 x weekly - 10 reps - 3 sets Seated Posterior Pelvic Tilt - 1 x daily - 7 x weekly - 10 reps - 3 sets Seated Flexion Stretch with Swiss Ball - 1 x daily - 7 x weekly - 10 reps - 3 sets - 5 seconds hold Lower Trunk Rotations - 1 x daily - 7 x weekly - 10 reps - 3 sets Shoulder Adduction with Anchored Resistance - 1 x daily - 7 x weekly - 10 reps - 2 sets - 5 seconds hold Scapular Retraction with Resistance - 1 x daily - 7 x weekly - 10 reps - 3 sets - 5 seconds hold Seated Heel Slide - 1 x daily - 7 x weekly - 10 reps - 3 sets - 5 seconds hold Seated Cervical Retraction - 1 x daily - 7 x weekly - 10 reps - 3 sets - 5 secibds hold Gastroc Stretch on Wall - 1 x daily - 7 x weekly - 5 reps - 3 sets - 15 seconds hold Sidelying Hip Abduction - 1 x daily - 7 x weekly - 8 reps - 3 sets Seated Knee Flexion Extension AROM - 3 x daily - 7 x weekly - 10 reps - 3 sets - 5 seconds hold Standing Partial Lunge - 1 x daily - 7 x weekly - 10 reps - 2 sets Standing Knee Flexion Stretch on Step - 1 x daily - 7 x weekly - 10 reps - 2 sets - 5 hold Seated Sidebending - 1 x daily - 7 x weekly - 10 reps - 3 sets - 5 seconds hold Seated Heel Toe Raises - 1 x daily - 7 x weekly - 10 reps - 3 sets Supine Posterior Pelvic Tilt - 1 x daily - 7 x weekly - 3 sets - 10 reps - 5 seconds hold Sit to Stand - 1 x daily - 7 x weekly - 3 sets - 10 reps Seated Hip External Rotation AROM - 1 x  daily - 7 x weekly - 3 sets - 10 reps - 5 seconds hold   PNF chops to the L to promote R low back rotation red double band 10x5 seconds   Red band difficult. Yellow band provided for HEP.  Reviewed and given as part of her HEP. Pt demonstrated and verbalized understanding. Handout provided.

## 2019-11-04 ENCOUNTER — Ambulatory Visit: Payer: Medicare Other

## 2019-11-04 ENCOUNTER — Other Ambulatory Visit: Payer: Self-pay

## 2019-11-04 DIAGNOSIS — M6281 Muscle weakness (generalized): Secondary | ICD-10-CM | POA: Diagnosis not present

## 2019-11-04 DIAGNOSIS — M25551 Pain in right hip: Secondary | ICD-10-CM

## 2019-11-04 DIAGNOSIS — M5416 Radiculopathy, lumbar region: Secondary | ICD-10-CM

## 2019-11-04 DIAGNOSIS — M25561 Pain in right knee: Secondary | ICD-10-CM

## 2019-11-04 NOTE — Therapy (Signed)
[place] [facility] [address], [place], [address] Phone: [phone]   Fax:  [phone]  Physical Therapy Treatment  Patient Details  Name: Destiny Shaw MRN: [id number] Date of Birth: July 28, 1950 Referring Provider (PT): Paralee Cancel, MD   Encounter Date: 11/04/2019   PT End of Session - 11/04/19 1303    Visit Number 37    Number of Visits 110    Date for PT Re-Evaluation 11/12/19    Authorization Type 5    Authorization Time Period of 10 progress note Medicare    PT Start Time 1303    PT Stop Time 1349    PT Time Calculation (min) 46 min    Activity Tolerance Patient tolerated treatment well    Behavior During Therapy [facility] for tasks assessed/performed           Past Medical History:  Diagnosis Date  . ADD (attention deficit disorder)   . Anemia   . Anginal pain ([place])    pt states has occas chest pain relates to indigestion; pt uses rest to relieve   . Anxiety   . Arthritis   . Concussion   . Depression   . Diabetes mellitus without complication ([place])   . Dizziness   . Fall   . GERD (gastroesophageal reflux disease)   . Headache   . History of urinary tract infection   . Hyperlipidemia   . Hypertension   . Hypothyroidism   . IBS (irritable bowel syndrome)   . Imbalance   . Numbness    right leg   . Numbness in both hands    comes and goes   . Pneumonia    last episode approx 1 year ago   . Sleep apnea   . Wears glasses     Past Surgical History:  Procedure Laterality Date  . BREAST CYST ASPIRATION Left 1980's   neg  . BREAST CYST EXCISION Right 1980's   neg  . BREAST LUMPECTOMY Right   . BREAST SURGERY    . CARPAL TUNNEL RELEASE    . CESAREAN SECTION     times 2  . COLONOSCOPY WITH PROPOFOL N/A 05/11/2015   Procedure: COLONOSCOPY WITH PROPOFOL;  Surgeon: Manya Silvas, MD;  Location: [facility] ENDOSCOPY;  Service: Endoscopy;  Laterality: N/A;  . DE QUERVAIN'S RELEASE Right   .  ESOPHAGOGASTRODUODENOSCOPY (EGD) WITH PROPOFOL N/A 05/11/2015   Procedure: ESOPHAGOGASTRODUODENOSCOPY (EGD) WITH PROPOFOL;  Surgeon: Manya Silvas, MD;  Location: [facility] ENDOSCOPY;  Service: Endoscopy;  Laterality: N/A;  . EYE SURGERY     laser surgery bilat   . HERNIA REPAIR    . KNEE ARTHROSCOPY    . LUMBAR LAMINECTOMY/DECOMPRESSION MICRODISCECTOMY Bilateral 09/14/2015   Procedure: MICRO LUMBAR BILATERAL DECOMPRESSION L4 - L5;  Surgeon: Susa Day, MD;  Location: [facility] ORS;  Service: Orthopedics;  Laterality: Bilateral;  . REDUCTION MAMMAPLASTY Bilateral 1980  . RHINOPLASTY    . TONSILLECTOMY    . TOTAL KNEE ARTHROPLASTY Left 06/18/2016   Procedure: LEFT TOTAL KNEE ARTHROPLASTY;  Surgeon: Paralee Cancel, MD;  Location: [facility] ORS;  Service: Orthopedics;  Laterality: Left;  Adductor Block  . TUBAL LIGATION    . UVULOPALATOPHARYNGOPLASTY      There were no vitals filed for this visit.   Subjective Assessment - 11/04/19 1329    Subjective Pt states kids bought her a rockerboard and blue foam pad (air ex type). Wants to know exercises for them. Back is ok, R knee  is painful, 6/10 currently.    Patient is accompained by: Family member    Pertinent History Low back pain. Pt states having back surgery about 2-3 years ago. After she had her L TKA, her back started aggravating her due to her walking. Pt also fell about 4 weeks ago onto her L knee. Dr. Alvan Dame checked out her L knee. L knee was fine but has some inflammation.  Pt tries to keep it iced. Pt still recovering for her L TKA but the fall set her back.   Her L knee surgery was last year.  L knee still bothers her a lot.  Had surgery for her back before her L knee surgery. Was doing well until her L knee surgery. The fall made it worse. Feels debilitating. Also has a hard time getting into and out of the car mainly due to her L knee.   Pt fell due to her L knee bucking on her. Pt was trying to pick something up from the side of the couch.  Also has a  hard time getting up from the floor.  Pt states having bladder accidents since after her fall (pt was recommended to tell her MD about it).  Bowel problems since taking medications (was constapated, but after taking medications, pt had diarrhea which was difficult to control. Getting out of the metformin fixed the bowel issue).  Pt states tingling and numbness L lateral LE along the L5 dermatome.  Denies saddle anesthesia.  Pt states that her back problem is mainly on her L side.  Pt landed on her L knee when she fell.  No other falls within the last 6 months. Pt also states being very dizzy since her L knee surgery.  Had PT treatement for her dizziness before which did not help.  The room feels like it is spinning when she gets dizzy.  I feel like it is getting better then she does something.  5/10 low back soreness currently (7/10 at most for the past 7 days after negotiating stairs, 5/10 without doing stairs). 4/10 L knee pain currenglty with gait 5/10 L knee pain at most for the past 7 days.    Patient Stated Goals Be better able to get into and out of her vehicle (midsized truck), into and out of chairs, be better able to roll in her bed with less L knee pain. Be able to get down on the floor and get back up.     Currently in Pain? Yes    Pain Score 6                                      PT Education - 11/04/19 1305    Education Details ther-ex. HEP    Person(s) Educated Patient    Methods Explanation;Demonstration;Tactile cues;Verbal cues;Handout    Comprehension Returned demonstration;Verbalized understanding          Objectives   MedbridgeAccess Code: [email or web address]  Latex free bands usedif used   Sitting posture: L trunk rotation  Standing posture: slightLlateral shift   R knee exercises:  Seated R hip extension isometrics, seated R hip ER, seated hip adduction isometrics   Therapeutic exercise  Pt brought her personal rocker board and  Air Ex pad  Reviewed/performed exercises for both equipment.     Standing B ankle DF/PF on rockerboard B UE assist 2 min    Forward step up onto  Air Ex pad with light touch assist     R 10x3    L 10x3     Standing mini squat with B UE assist 10x2    Improved R knee comfort reported by pt with Air Ex pad exercises     Reviewed and gave aforementioned exercises at part of her HEP. Pt demonstrated and verbalized understanding. Handout provided.   Seated R hip ER 10x3 with 5 second holds   Seated R hip extension isometrics 10x5 seconds for 2 sets   Improved exercise technique, movement at target joints, use of target muscles aftermin tomod verbal, visual, tactile cues.   Response to treatment Pt tolerated session well without aggravation of symptoms.    Clinical impression Provided exercises for pt's personal rocker board and Air Ex pad. Decreased R knee discomfort with Air Ex pad exercises. Decreased R knee pain with exercises to strengthen R glute muscles. Decreased R knee pain to 4/10 during gait after session. Pt will benefit from continued skilled physical therapy services to decrease pain, improve strength and function.          PT Short Term Goals - 06/10/19 1849      PT SHORT TERM GOAL #1   Title Patient will be independent with her HEP to help decrease back and L knee pain and improve ability to perform functional tasks.     Time 3    Period Weeks    Status On-going    Target Date 07/02/19             PT Long Term Goals - 09/16/19 1343      PT LONG TERM GOAL #1   Title Patient will have a decrease in back pain to 3/10 or less at worst to promote ability to ambulate, turn in bed, perform standing tasks with less pain.     Baseline 6/10 back pain at most (09/24/2017); 7-8/10 at most for the past 7 days, duration of pain is better since starting PT (10/29/2017); 5-6/10 at most for the past 7 days (11/18/2017); 8/10 at most for the past 7 days but the  duration of pain is less compared to prior to starting PT (12/12/2017).  7/10 back pain at most for the past 7 days (pt states doing her exercises helps decrease her pain; 01/02/2018); walking (3/10), standing (4/10), bed mobility (0/10) 02/05/2018; 6/10 back pain at most for the past 7 days (03/03/2018); 6.5/10 back pain at most for the past 7 days (03/27/2018); 7-8/10 at most (05/08/2018); 7/10 back pain at worst (07/22/2018);  7-8/10 at most for the past 7 days (09/22/2018); 5-7/10 at worst for the past 7 days then calms down (10/27/2018); 6/10 back pain at most for the past 7 days (when pt cooks and cleans up in kitchen; 12/17/2018); 6-7/10 back spams pain at most for the past 7 days, usually when she is doing stuff in the kitchen (01/29/2019); 7/10 at worst for the past 7 days (03/25/2019); 7.5/10 (05/20/19); 8/10 low back pain at most for the past 7 days (06/10/2019); 7/10 at worst; Easier to decrease back pain since starting PT (07/22/2019). 4-5/10 at most for the past 7 days (09/16/2019)    Time 8    Period Weeks    Status On-going    Target Date 11/12/19      PT LONG TERM GOAL #2   Title Patient will have a decrease in L knee pain to 3/10 or less at worst to promote ability to ambulate, negotiate stairs, get  into and out of a car more comfortably.     Baseline 6/10 L knee pain at most for the past 3 months (09/24/2017); 7-8/10 at most for the past 7 days, duration of pain is better since starting PT (10/29/2017); 7/10 at most for the past 7 days (11/18/2017); 6/10 L knee pain at most for the past 7 days (12/12/2017); 5/10 L knee pain at most for the past 7 days (pt states doing her exercises helps decrease pain; 01/02/2018); L knee 3/10 (02/05/2018); 4/10 at most for the past 7 days (03/03/2018); 5.5/10 L knee at most for the past 7 days (03/27/2018); 6-7/10 L knee pain at most (05/08/2018); 7/10 L knee pain at worst for the past month (07/22/2018);  6/10 L knee pain at most for the past 7 days (09/22/2018); 5-6/10 at most  for the past 7 days sporadically, can get it better with exercise (10/27/2018); 6/10 L knee ache at most for the past 7 days (12/17/2018); 5-6/10 L knee pain at most for the past 7 days such as going up and down steps (01/29/2019); 5/10 at most for the past 7 days (03/25/2019); 5.5/10 (05/20/19); 4/10 (06/10/2019); 4/10 (07/22/2019); 4/10 L knee pain at most for the past 7 days (09/16/2019)    Time 8    Period Weeks    Status Partially Met    Target Date 11/12/19      PT LONG TERM GOAL #3   Title Patient will improve TUG time to 12 seconds or less as a demonstration of improved functional mobility and balance.     Baseline TUG no AD: 16.05 seconds on average (09/24/2017); 14.3 seconds on average (10/29/2017); 13 seconds average (11/21/2017); 16.67 seconds average (12/12/2017);  14.17 seconds average (01/02/2018); 11.8 sec sonds (02/05/2018)    Time 6    Period Weeks    Status Achieved      PT LONG TERM GOAL #4   Title Patient will improve her back FOTO score by at least 10 points as a demonstration of improved funtion.      Baseline Back FOTO: 33 (09/24/2017); 38 (10/29/2017); 36 (11/21/2017); 40 (12/12/2017); 42 (01/02/2018); 47 (03/03/2018)    Time 6    Period Weeks    Status Achieved      PT LONG TERM GOAL #5   Title Patient will improve bilateral LE strength by at least 1/2 MMT grade to promote ability to perform standing tasks.     Time 6    Period Weeks    Status Achieved      PT LONG TERM GOAL #6   Title Patient will improve her Modified Oswestry Low Back pain disablity questionnaire by at least 10% as a demonstration of improved function.     Baseline 48% (09/24/2017); 46% (10/29/2017), (11/21/2017); 56% (12/12/2017); 36% (01/02/2018), (03/03/2018)    Time 6    Period Weeks    Status Achieved      PT LONG TERM GOAL #7   Title Pt will report decreased difficulty stepping into and out of her bathtub as well as getting into and out of her truck to promote ability to get into and out of places.      Baseline Pt reports difficulty stepping into and out of her bathtub as well as difficulty getting into and out of her truck (01/02/2018); Pt states the truck and bath tub and difficult and painful.  (02/05/2018); Improving per subjective reports (03/03/2018); decreased difficulty with truck tranfser after cues for technique and practice, decreasing difficulty with  stepping into and out of the bath tub based on subjective reports (03/27/2018 and 04/01/2018); Decreasing difficulty stepping into a bathtub. No difficulty getting into and out of her truck (05/08/2018), (07/22/2018); Difficulty stepping into and out of the bath tub. No difficulty with truck transfers (09/22/2018); Able to get into her bath tub, difficulty getting out (10/27/18); Still has difficulty getting into and out of bathtub (02/23/2019); Better able to step into the bathtub. Still has difficulty stepping out of the bathtub due to knee flexion (03/25/2019); No difficulty stepping into her bath tub. Still has difficulty stepping out due to decreased L knee flexion ROM (05/20/2019), (06/10/2019), (07/22/2019), (09/16/2019)    Time 8    Period Weeks    Status Partially Met    Target Date 11/12/19      PT LONG TERM GOAL #8   Title Patient will improve her BERG balance test score to 46/56 or more as a demonstration of decreased fall risk     Baseline 42/56 (01/02/2018); 54/56 (02/05/2018)    Time 6    Period Weeks    Status Achieved      PT LONG TERM GOAL  #9   TITLE Pt will report minimal to no dizziness related to vertigo to promote balance and decrease fall risk.     Baseline Pt reports dizziness (related to vertigo) which seem to affect her balance (01/15/2018); Patient reports "a little bit every now and then" (02/05/2018); Pt reports dizziness and decreased balance (01/29/2019); Dizziness has improved some (02/23/2019), (03/25/2019); Still has dizziness and vertigo; does the [place] for both sides instead of just the L side (06/10/2019), Dizziness has  decreased. Does Epley for L side (09/16/2019)    Time 8    Period Weeks    Status Partially Met    Target Date 11/12/19      PT LONG TERM GOAL  #10   TITLE Pt will be able to turn 360 degrees to the R and L at least 3 times without dizziness and no LOB to promote safety and decrease fall risk.    Baseline 360 degree turn to the R and L causes a loss of balance (12/17/2018), (01/29/2019). able to perform 3 times with each side with improved balance, minimal dizziness. (02/23/2019); Dizziness (05/20/19), slight dizziness (06/10/2019), (07/22/2019), (09/16/2019)    Time 8    Period Weeks    Status On-going    Target Date 11/12/19      PT LONG TERM GOAL  #11   TITLE Patient will have a decrease in R hip and knee pain to 4/10 or less at worst to promote ability to ambulate with less discomfort.    Baseline 7/10 R hip and knee pain at worst for the past month (03/25/2019); 5/10 R hip pain, 6/10 R knee pain at most for the past 7 days (05/20/19); 5.5/10 R knee pain (05/20/19); 8/10 R hip; 6/10 R knee at most for the past 7 days (06/10/2019); 7/10, easier to decrease pain since starting PT (07/22/2019); 6/10  R hip, and 6/10 R knee at most for the past 7 days (09/16/2019)    Time 8    Period Weeks    Status On-going    Target Date 11/12/19                 Plan - 11/04/19 1303    Clinical Impression Statement Provided exercises for pt's personal rocker board and Air Ex pad. Decreased R knee discomfort with Air Ex pad exercises. Decreased R  knee pain with exercises to strengthen R glute muscles. Decreased R knee pain to 4/10 during gait after session. Pt will benefit from continued skilled physical therapy services to decrease pain, improve strength and function.    Personal Factors and Comorbidities Age;Comorbidity 3+;Time since onset of injury/illness/exacerbation;Fitness;Past/Current Experience    Comorbidities Back, knee pain, hx of dizziness, hx of back surgery, hx of C-section; memory lapse     Examination-Activity Limitations Squat    Stability/Clinical Decision Making Stable/Uncomplicated    Rehab Potential Fair    Clinical Impairments Affecting Rehab Potential difficulty with carry-over of improvements in clinic, chronicity of condition, age, multiple problem areas; MEMORY lapse    PT Frequency 1x / week    PT Duration 8 weeks    PT Treatment/Interventions Therapeutic activities;Therapeutic exercise;Balance training;Neuromuscular re-education;Patient/family education;Manual techniques;Dry needling;Aquatic Therapy;Electrical Stimulation;Iontophoresis 29m/ml Dexamethasone;Gait training;Canalith Repostioning;Vestibular    PT Next Visit Plan  hip and knee strengthening, core strengthening, patellar mobility, manual techniques, modalities PRN    Consulted and Agree with Plan of Care Patient           Patient will benefit from skilled therapeutic intervention in order to improve the following deficits and impairments:  Pain, Improper body mechanics, Postural dysfunction, Dizziness, Decreased strength, Difficulty walking, Decreased balance  Visit Diagnosis: Muscle weakness (generalized)  Right knee pain, unspecified chronicity  Radiculopathy, lumbar region  Pain in right hip     Problem List Patient Active Problem List   Diagnosis Date Noted  . Obese 06/20/2016  . Status post right knee replacement 06/19/2016  . S/P left TKA 06/18/2016  . Spinal stenosis of lumbar region 09/14/2015    MJoneen BoersPT, DPT   11/04/2019, 2:00 PM  [facility] [address] [place] [address]Phone: [phone]  Fax:  [phone] Name: Destiny DICENSOMRN: [id number] of Birth: [birth year]

## 2019-11-04 NOTE — Patient Instructions (Addendum)
Access Code: [email or web address] URL: [email or web address] Date: 11/04/2019 Prepared by: Joneen Boers  Exercises Seated Hip Adduction Isometrics with Diona Foley - 1 x daily - 7 x weekly - 10 reps - 2 sets - 5 seconds hold Seated Gluteal Sets - 1 x daily - 7 x weekly - 10 reps - 3 sets - 5 seconds hold Standing Bilateral Gastroc Stretch with Step - 2 x daily - 7 x weekly - 5 reps - 1 sets - 20 seconds hold Seated Long Arc Quad with Ankle Weight - 1 x daily - 7 x weekly - 10 reps - 3 sets - 5 seconds hold Bent Knee Fallouts - 1 x daily - 7 x weekly - 10 reps - 3 sets Seated Posterior Pelvic Tilt - 1 x daily - 7 x weekly - 10 reps - 3 sets Seated Flexion Stretch with Swiss Ball - 1 x daily - 7 x weekly - 10 reps - 3 sets - 5 seconds hold Lower Trunk Rotations - 1 x daily - 7 x weekly - 10 reps - 3 sets Shoulder Adduction with Anchored Resistance - 1 x daily - 7 x weekly - 10 reps - 2 sets - 5 seconds hold Scapular Retraction with Resistance - 1 x daily - 7 x weekly - 10 reps - 3 sets - 5 seconds hold Seated Heel Slide - 1 x daily - 7 x weekly - 10 reps - 3 sets - 5 seconds hold Seated Cervical Retraction - 1 x daily - 7 x weekly - 10 reps - 3 sets - 5 secibds hold Gastroc Stretch on Wall - 1 x daily - 7 x weekly - 5 reps - 3 sets - 15 seconds hold Sidelying Hip Abduction - 1 x daily - 7 x weekly - 8 reps - 3 sets Seated Knee Flexion Extension AROM - 3 x daily - 7 x weekly - 10 reps - 3 sets - 5 seconds hold Standing Partial Lunge - 1 x daily - 7 x weekly - 10 reps - 2 sets Standing Knee Flexion Stretch on Step - 1 x daily - 7 x weekly - 10 reps - 2 sets - 5 hold Seated Sidebending - 1 x daily - 7 x weekly - 10 reps - 3 sets - 5 seconds hold Seated Heel Toe Raises - 1 x daily - 7 x weekly - 10 reps - 3 sets Supine Posterior Pelvic Tilt - 1 x daily - 7 x weekly - 3 sets - 10 reps - 5 seconds hold Sit to Stand - 1 x daily - 7 x weekly - 3 sets - 10 reps Seated Hip External Rotation AROM - 1 x  daily - 7 x weekly - 3 sets - 10 reps - 5 seconds hold Standing Ankle Plantar Flexion Dorsiflexion on Rocker Board with Counter Support - 3 x daily - 7 x weekly - 3 sets - 10 reps Forward Step Up - 1 x daily - 7 x weekly - 3 sets - 10 reps Lateral Step Up with Counter Support - 1 x daily - 7 x weekly - 3 sets - 10 reps Squat on Foam - 1 x daily - 7 x weekly - 3 sets - 10 reps Mini Squat with Counter Support - 1 x daily - 7 x weekly - 2 sets - 10 reps   Seated right hip extension isometrics   Sitting on a chair,    Squeeze your rear end muscles together and press your right foot onto the  floor.     Hold for 5 seconds    Repeat 10 times   Perform 3 sets daily.      This is a corrective exercise. Once you no longer have symptoms, you can stop.

## 2019-11-11 ENCOUNTER — Ambulatory Visit: Payer: Medicare Other

## 2019-11-11 ENCOUNTER — Other Ambulatory Visit: Payer: Self-pay

## 2019-11-11 DIAGNOSIS — M5416 Radiculopathy, lumbar region: Secondary | ICD-10-CM

## 2019-11-11 DIAGNOSIS — M6281 Muscle weakness (generalized): Secondary | ICD-10-CM | POA: Diagnosis not present

## 2019-11-11 DIAGNOSIS — M25551 Pain in right hip: Secondary | ICD-10-CM

## 2019-11-11 DIAGNOSIS — M25561 Pain in right knee: Secondary | ICD-10-CM

## 2019-11-11 NOTE — Therapy (Signed)
[facility] [address], [place], [address] Phone: [phone]   Fax:  [phone]  Physical Therapy Treatment  Patient Details  Name: Destiny Shaw MRN: [id number] Date of Birth: March 10, 1950 No data recorded  Encounter Date: 11/11/2019   PT End of Session - 11/11/19 1304    Visit Number 40    Number of Visits 110    Date for PT Re-Evaluation 11/12/19    Authorization Type 6    Authorization Time Period of 10 progress note Medicare    PT Start Time 1304    PT Stop Time 1349    PT Time Calculation (min) 45 min    Activity Tolerance Patient tolerated treatment well    Behavior During Therapy [facility] for tasks assessed/performed           Past Medical History:  Diagnosis Date  . ADD (attention deficit disorder)   . Anemia   . Anginal pain ([place])    pt states has occas chest pain relates to indigestion; pt uses rest to relieve   . Anxiety   . Arthritis   . Concussion   . Depression   . Diabetes mellitus without complication ([place])   . Dizziness   . Fall   . GERD (gastroesophageal reflux disease)   . Headache   . History of urinary tract infection   . Hyperlipidemia   . Hypertension   . Hypothyroidism   . IBS (irritable bowel syndrome)   . Imbalance   . Numbness    right leg   . Numbness in both hands    comes and goes   . Pneumonia    last episode approx 1 year ago   . Sleep apnea   . Wears glasses     Past Surgical History:  Procedure Laterality Date  . BREAST CYST ASPIRATION Left 1980's   neg  . BREAST CYST EXCISION Right 1980's   neg  . BREAST LUMPECTOMY Right   . BREAST SURGERY    . CARPAL TUNNEL RELEASE    . CESAREAN SECTION     times 2  . COLONOSCOPY WITH PROPOFOL N/A 05/11/2015   Procedure: COLONOSCOPY WITH PROPOFOL;  Surgeon: Manya Silvas, MD;  Location: [facility] ENDOSCOPY;  Service: Endoscopy;  Laterality: N/A;  . DE QUERVAIN'S RELEASE Right   . ESOPHAGOGASTRODUODENOSCOPY  (EGD) WITH PROPOFOL N/A 05/11/2015   Procedure: ESOPHAGOGASTRODUODENOSCOPY (EGD) WITH PROPOFOL;  Surgeon: Manya Silvas, MD;  Location: [facility] ENDOSCOPY;  Service: Endoscopy;  Laterality: N/A;  . EYE SURGERY     laser surgery bilat   . HERNIA REPAIR    . KNEE ARTHROSCOPY    . LUMBAR LAMINECTOMY/DECOMPRESSION MICRODISCECTOMY Bilateral 09/14/2015   Procedure: MICRO LUMBAR BILATERAL DECOMPRESSION L4 - L5;  Surgeon: Susa Day, MD;  Location: [facility] ORS;  Service: Orthopedics;  Laterality: Bilateral;  . REDUCTION MAMMAPLASTY Bilateral 1980  . RHINOPLASTY    . TONSILLECTOMY    . TOTAL KNEE ARTHROPLASTY Left 06/18/2016   Procedure: LEFT TOTAL KNEE ARTHROPLASTY;  Surgeon: Paralee Cancel, MD;  Location: [facility] ORS;  Service: Orthopedics;  Laterality: Left;  Adductor Block  . TUBAL LIGATION    . UVULOPALATOPHARYNGOPLASTY      There were no vitals filed for this visit.   Subjective Assessment - 11/11/19 1305    Subjective Walking from house to her car, 7/10 R knee pain, 6/10 from waiting room to treatment area. 7+/10 R hip pain. L knee is doing ok. L medial knee  gets sore every now and again. Low back is 7/10 low back pain when walking. Was very tired after last session but felt a lot better.  The seated R hip extension isometrics helps with R knee pain.    Patient is accompained by: Family member    Pertinent History Low back pain. Pt states having back surgery about 2-3 years ago. After she had her L TKA, her back started aggravating her due to her walking. Pt also fell about 4 weeks ago onto her L knee. Dr. Alvan Dame checked out her L knee. L knee was fine but has some inflammation.  Pt tries to keep it iced. Pt still recovering for her L TKA but the fall set her back.   Her L knee surgery was last year.  L knee still bothers her a lot.  Had surgery for her back before her L knee surgery. Was doing well until her L knee surgery. The fall made it worse. Feels debilitating. Also has a hard time getting into and out  of the car mainly due to her L knee.   Pt fell due to her L knee bucking on her. Pt was trying to pick something up from the side of the couch.  Also has a hard time getting up from the floor.  Pt states having bladder accidents since after her fall (pt was recommended to tell her MD about it).  Bowel problems since taking medications (was constapated, but after taking medications, pt had diarrhea which was difficult to control. Getting out of the metformin fixed the bowel issue).  Pt states tingling and numbness L lateral LE along the L5 dermatome.  Denies saddle anesthesia.  Pt states that her back problem is mainly on her L side.  Pt landed on her L knee when she fell.  No other falls within the last 6 months. Pt also states being very dizzy since her L knee surgery.  Had PT treatement for her dizziness before which did not help.  The room feels like it is spinning when she gets dizzy.  I feel like it is getting better then she does something.  5/10 low back soreness currently (7/10 at most for the past 7 days after negotiating stairs, 5/10 without doing stairs). 4/10 L knee pain currenglty with gait 5/10 L knee pain at most for the past 7 days.    Patient Stated Goals Be better able to get into and out of her vehicle (midsized truck), into and out of chairs, be better able to roll in her bed with less L knee pain. Be able to get down on the floor and get back up.     Currently in Pain? Yes    Pain Score 7                                      PT Education - 11/11/19 1313    Education Details ther-ex    Person(s) Educated Patient    Methods Explanation;Demonstration;Tactile cues;Verbal cues    Comprehension Returned demonstration;Verbalized understanding          Objectives   MedbridgeAccess Code: [email or web address]  Latex free bands usedif used   Sitting posture: L trunk rotation  Standing posture: slightLlateral shift   R knee exercises:  Seated R hip  extension isometrics, seated R hip ER, seated hip adduction isometrics, hip IR, R piriformis stretch   Therapeutic exercise  Seated R hip extension isometrics 10x10 seconds for 3 sets  Seated R hip ER 10x3 with 5 second holds  seated hip adduction isometrics ball and glute max squeeze 10x10 seconds for 3 sets  Static mini lunge with one UE assist   R 10x2  Decreased R hip IR observed  Seated hip IR   R 10x3  Seated R hip IR stretch 30 seconds x 3  Followed by seated hip IR   R 10x2  Sit <> stand from regular chair with arms, emphasis on distributing weight throughout feet so as to no tip over backwards.   Gait throughout session to assess effectiveness of treatment  Gait with glute max squeeze and increase step length 32 ft x 3  Decreased R hip pain     Improved exercise technique, movement at target joints, use of target muscles aftermin tomod verbal, visual, tactile cues.   Response to treatment Pt tolerated session well without aggravation of symptoms. Decreased R hip and knee pain to 5/10 and no back pain with gait after session.     Clinical impression Decreased R hip, back and R knee pain with treatment to improve hip IR and increasing glute max muscle activation. Pt will benefit from continued skilled physical therapy services to decrease pain, improve strength and function.      PT Short Term Goals - 06/10/19 1849      PT SHORT TERM GOAL #1   Title Patient will be independent with her HEP to help decrease back and L knee pain and improve ability to perform functional tasks.     Time 3    Period Weeks    Status On-going    Target Date 07/02/19             PT Long Term Goals - 09/16/19 1343      PT LONG TERM GOAL #1   Title Patient will have a decrease in back pain to 3/10 or less at worst to promote ability to ambulate, turn in bed, perform standing tasks with less pain.     Baseline 6/10 back pain at most (09/24/2017); 7-8/10 at  most for the past 7 days, duration of pain is better since starting PT (10/29/2017); 5-6/10 at most for the past 7 days (11/18/2017); 8/10 at most for the past 7 days but the duration of pain is less compared to prior to starting PT (12/12/2017).  7/10 back pain at most for the past 7 days (pt states doing her exercises helps decrease her pain; 01/02/2018); walking (3/10), standing (4/10), bed mobility (0/10) 02/05/2018; 6/10 back pain at most for the past 7 days (03/03/2018); 6.5/10 back pain at most for the past 7 days (03/27/2018); 7-8/10 at most (05/08/2018); 7/10 back pain at worst (07/22/2018);  7-8/10 at most for the past 7 days (09/22/2018); 5-7/10 at worst for the past 7 days then calms down (10/27/2018); 6/10 back pain at most for the past 7 days (when pt cooks and cleans up in kitchen; 12/17/2018); 6-7/10 back spams pain at most for the past 7 days, usually when she is doing stuff in the kitchen (01/29/2019); 7/10 at worst for the past 7 days (03/25/2019); 7.5/10 (05/20/19); 8/10 low back pain at most for the past 7 days (06/10/2019); 7/10 at worst; Easier to decrease back pain since starting PT (07/22/2019). 4-5/10 at most for the past 7 days (09/16/2019)    Time 8    Period Weeks    Status On-going    Target Date  11/12/19      PT LONG TERM GOAL #2   Title Patient will have a decrease in L knee pain to 3/10 or less at worst to promote ability to ambulate, negotiate stairs, get into and out of a car more comfortably.     Baseline 6/10 L knee pain at most for the past 3 months (09/24/2017); 7-8/10 at most for the past 7 days, duration of pain is better since starting PT (10/29/2017); 7/10 at most for the past 7 days (11/18/2017); 6/10 L knee pain at most for the past 7 days (12/12/2017); 5/10 L knee pain at most for the past 7 days (pt states doing her exercises helps decrease pain; 01/02/2018); L knee 3/10 (02/05/2018); 4/10 at most for the past 7 days (03/03/2018); 5.5/10 L knee at most for the past 7 days (03/27/2018);  6-7/10 L knee pain at most (05/08/2018); 7/10 L knee pain at worst for the past month (07/22/2018);  6/10 L knee pain at most for the past 7 days (09/22/2018); 5-6/10 at most for the past 7 days sporadically, can get it better with exercise (10/27/2018); 6/10 L knee ache at most for the past 7 days (12/17/2018); 5-6/10 L knee pain at most for the past 7 days such as going up and down steps (01/29/2019); 5/10 at most for the past 7 days (03/25/2019); 5.5/10 (05/20/19); 4/10 (06/10/2019); 4/10 (07/22/2019); 4/10 L knee pain at most for the past 7 days (09/16/2019)    Time 8    Period Weeks    Status Partially Met    Target Date 11/12/19      PT LONG TERM GOAL #3   Title Patient will improve TUG time to 12 seconds or less as a demonstration of improved functional mobility and balance.     Baseline TUG no AD: 16.05 seconds on average (09/24/2017); 14.3 seconds on average (10/29/2017); 13 seconds average (11/21/2017); 16.67 seconds average (12/12/2017);  14.17 seconds average (01/02/2018); 11.8 sec sonds (02/05/2018)    Time 6    Period Weeks    Status Achieved      PT LONG TERM GOAL #4   Title Patient will improve her back FOTO score by at least 10 points as a demonstration of improved funtion.      Baseline Back FOTO: 33 (09/24/2017); 38 (10/29/2017); 36 (11/21/2017); 40 (12/12/2017); 42 (01/02/2018); 47 (03/03/2018)    Time 6    Period Weeks    Status Achieved      PT LONG TERM GOAL #5   Title Patient will improve bilateral LE strength by at least 1/2 MMT grade to promote ability to perform standing tasks.     Time 6    Period Weeks    Status Achieved      PT LONG TERM GOAL #6   Title Patient will improve her Modified Oswestry Low Back pain disablity questionnaire by at least 10% as a demonstration of improved function.     Baseline 48% (09/24/2017); 46% (10/29/2017), (11/21/2017); 56% (12/12/2017); 36% (01/02/2018), (03/03/2018)    Time 6    Period Weeks    Status Achieved      PT LONG TERM GOAL #7   Title Pt  will report decreased difficulty stepping into and out of her bathtub as well as getting into and out of her truck to promote ability to get into and out of places.     Baseline Pt reports difficulty stepping into and out of her bathtub as well as difficulty getting into and  out of her truck (01/02/2018); Pt states the truck and bath tub and difficult and painful.  (02/05/2018); Improving per subjective reports (03/03/2018); decreased difficulty with truck tranfser after cues for technique and practice, decreasing difficulty with stepping into and out of the bath tub based on subjective reports (03/27/2018 and 04/01/2018); Decreasing difficulty stepping into a bathtub. No difficulty getting into and out of her truck (05/08/2018), (07/22/2018); Difficulty stepping into and out of the bath tub. No difficulty with truck transfers (09/22/2018); Able to get into her bath tub, difficulty getting out (10/27/18); Still has difficulty getting into and out of bathtub (02/23/2019); Better able to step into the bathtub. Still has difficulty stepping out of the bathtub due to knee flexion (03/25/2019); No difficulty stepping into her bath tub. Still has difficulty stepping out due to decreased L knee flexion ROM (05/20/2019), (06/10/2019), (07/22/2019), (09/16/2019)    Time 8    Period Weeks    Status Partially Met    Target Date 11/12/19      PT LONG TERM GOAL #8   Title Patient will improve her BERG balance test score to 46/56 or more as a demonstration of decreased fall risk     Baseline 42/56 (01/02/2018); 54/56 (02/05/2018)    Time 6    Period Weeks    Status Achieved      PT LONG TERM GOAL  #9   TITLE Pt will report minimal to no dizziness related to vertigo to promote balance and decrease fall risk.     Baseline Pt reports dizziness (related to vertigo) which seem to affect her balance (01/15/2018); Patient reports "a little bit every now and then" (02/05/2018); Pt reports dizziness and decreased balance (01/29/2019);  Dizziness has improved some (02/23/2019), (03/25/2019); Still has dizziness and vertigo; does the [place] for both sides instead of just the L side (06/10/2019), Dizziness has decreased. Does Epley for L side (09/16/2019)    Time 8    Period Weeks    Status Partially Met    Target Date 11/12/19      PT LONG TERM GOAL  #10   TITLE Pt will be able to turn 360 degrees to the R and L at least 3 times without dizziness and no LOB to promote safety and decrease fall risk.    Baseline 360 degree turn to the R and L causes a loss of balance (12/17/2018), (01/29/2019). able to perform 3 times with each side with improved balance, minimal dizziness. (02/23/2019); Dizziness (05/20/19), slight dizziness (06/10/2019), (07/22/2019), (09/16/2019)    Time 8    Period Weeks    Status On-going    Target Date 11/12/19      PT LONG TERM GOAL  #11   TITLE Patient will have a decrease in R hip and knee pain to 4/10 or less at worst to promote ability to ambulate with less discomfort.    Baseline 7/10 R hip and knee pain at worst for the past month (03/25/2019); 5/10 R hip pain, 6/10 R knee pain at most for the past 7 days (05/20/19); 5.5/10 R knee pain (05/20/19); 8/10 R hip; 6/10 R knee at most for the past 7 days (06/10/2019); 7/10, easier to decrease pain since starting PT (07/22/2019); 6/10  R hip, and 6/10 R knee at most for the past 7 days (09/16/2019)    Time 8    Period Weeks    Status On-going    Target Date 11/12/19  Plan - 11/11/19 1314    Clinical Impression Statement Decreased R hip, back and R knee pain with treatment to improve hip IR and increasing glute max muscle activation. Pt will benefit from continued skilled physical therapy services to decrease pain, improve strength and function.    Personal Factors and Comorbidities Age;Comorbidity 3+;Time since onset of injury/illness/exacerbation;Fitness;Past/Current Experience    Comorbidities Back, knee pain, hx of dizziness, hx of back  surgery, hx of C-section; memory lapse    Examination-Activity Limitations Squat    Stability/Clinical Decision Making Stable/Uncomplicated    Rehab Potential Fair    Clinical Impairments Affecting Rehab Potential difficulty with carry-over of improvements in clinic, chronicity of condition, age, multiple problem areas; MEMORY lapse    PT Frequency 1x / week    PT Duration 8 weeks    PT Treatment/Interventions Therapeutic activities;Therapeutic exercise;Balance training;Neuromuscular re-education;Patient/family education;Manual techniques;Dry needling;Aquatic Therapy;Electrical Stimulation;Iontophoresis 51m/ml Dexamethasone;Gait training;Canalith Repostioning;Vestibular    PT Next Visit Plan  hip and knee strengthening, core strengthening, patellar mobility, manual techniques, modalities PRN    Consulted and Agree with Plan of Care Patient           Patient will benefit from skilled therapeutic intervention in order to improve the following deficits and impairments:  Pain, Improper body mechanics, Postural dysfunction, Dizziness, Decreased strength, Difficulty walking, Decreased balance  Visit Diagnosis: Muscle weakness (generalized)  Right knee pain, unspecified chronicity  Radiculopathy, lumbar region  Pain in right hip     Problem List Patient Active Problem List   Diagnosis Date Noted  . Obese 06/20/2016  . Status post right knee replacement 06/19/2016  . S/P left TKA 06/18/2016  . Spinal stenosis of lumbar region 09/14/2015    MJoneen BoersPT, DPT   11/11/2019, 1:53 PM  [facility] [address] [place] [address]Phone: [phone]  Fax:  [phone] Name: RELLIYAH LISZEWSKIMRN: [id number] of Birth: 401-17-1952

## 2019-11-11 NOTE — Patient Instructions (Signed)
Access Code: [email or web address] URL: [email or web address] Date: 11/11/2019 Prepared by: Joneen Boers  Exercises Seated Hip Adduction Isometrics with Diona Foley - 1 x daily - 7 x weekly - 10 reps - 2 sets - 5 seconds hold Seated Gluteal Sets - 1 x daily - 7 x weekly - 10 reps - 3 sets - 5 seconds hold Standing Bilateral Gastroc Stretch with Step - 2 x daily - 7 x weekly - 5 reps - 1 sets - 20 seconds hold Seated Long Arc Quad with Ankle Weight - 1 x daily - 7 x weekly - 10 reps - 3 sets - 5 seconds hold Bent Knee Fallouts - 1 x daily - 7 x weekly - 10 reps - 3 sets Seated Posterior Pelvic Tilt - 1 x daily - 7 x weekly - 10 reps - 3 sets Seated Flexion Stretch with Swiss Ball - 1 x daily - 7 x weekly - 10 reps - 3 sets - 5 seconds hold Lower Trunk Rotations - 1 x daily - 7 x weekly - 10 reps - 3 sets Shoulder Adduction with Anchored Resistance - 1 x daily - 7 x weekly - 10 reps - 2 sets - 5 seconds hold Scapular Retraction with Resistance - 1 x daily - 7 x weekly - 10 reps - 3 sets - 5 seconds hold Seated Heel Slide - 1 x daily - 7 x weekly - 10 reps - 3 sets - 5 seconds hold Seated Cervical Retraction - 1 x daily - 7 x weekly - 10 reps - 3 sets - 5 secibds hold Gastroc Stretch on Wall - 1 x daily - 7 x weekly - 5 reps - 3 sets - 15 seconds hold Sidelying Hip Abduction - 1 x daily - 7 x weekly - 8 reps - 3 sets Seated Knee Flexion Extension AROM - 3 x daily - 7 x weekly - 10 reps - 3 sets - 5 seconds hold Standing Partial Lunge - 1 x daily - 7 x weekly - 10 reps - 2 sets Standing Knee Flexion Stretch on Step - 1 x daily - 7 x weekly - 10 reps - 2 sets - 5 hold Seated Sidebending - 1 x daily - 7 x weekly - 10 reps - 3 sets - 5 seconds hold Seated Heel Toe Raises - 1 x daily - 7 x weekly - 10 reps - 3 sets Supine Posterior Pelvic Tilt - 1 x daily - 7 x weekly - 3 sets - 10 reps - 5 seconds hold Sit to Stand - 1 x daily - 7 x weekly - 3 sets - 10 reps Seated Hip External Rotation AROM - 1 x  daily - 7 x weekly - 3 sets - 10 reps - 5 seconds hold Standing Ankle Plantar Flexion Dorsiflexion on Rocker Board with Counter Support - 3 x daily - 7 x weekly - 3 sets - 10 reps Forward Step Up - 1 x daily - 7 x weekly - 3 sets - 10 reps Lateral Step Up with Counter Support - 1 x daily - 7 x weekly - 3 sets - 10 reps Mini Squat with Counter Support - 1 x daily - 7 x weekly - 2 sets - 10 reps Seated Piriformis Stretch - 3 x daily - 7 x weekly - 1 sets - 5 reps - 30 seconds hold

## 2019-11-18 ENCOUNTER — Other Ambulatory Visit: Payer: Self-pay

## 2019-11-18 ENCOUNTER — Ambulatory Visit: Payer: Medicare Other

## 2019-11-18 DIAGNOSIS — M25561 Pain in right knee: Secondary | ICD-10-CM

## 2019-11-18 DIAGNOSIS — M6281 Muscle weakness (generalized): Secondary | ICD-10-CM | POA: Diagnosis not present

## 2019-11-18 DIAGNOSIS — M5416 Radiculopathy, lumbar region: Secondary | ICD-10-CM

## 2019-11-18 DIAGNOSIS — R42 Dizziness and giddiness: Secondary | ICD-10-CM

## 2019-11-18 DIAGNOSIS — M25562 Pain in left knee: Secondary | ICD-10-CM

## 2019-11-18 DIAGNOSIS — M25551 Pain in right hip: Secondary | ICD-10-CM

## 2019-11-18 DIAGNOSIS — Z9181 History of falling: Secondary | ICD-10-CM

## 2019-11-18 NOTE — Therapy (Signed)
[facility] [address], [place], [address] Phone: [phone]   Fax:  [phone]  Physical Therapy Treatment  Patient Details  Name: Destiny Shaw MRN: [id number] Date of Birth: [birth year] Referring Provider (PT): Paralee Cancel, MD   Encounter Date: 11/18/2019   PT End of Session - 11/18/19 7322    Visit Number [id number]    Number of Visits 118    Date for PT Re-Evaluation 01/14/20    Authorization Type 7    Authorization Time Period of 10 progress note Medicare    PT Start Time 0254    PT Stop Time 1401    PT Time Calculation (min) 55 min    Activity Tolerance Patient tolerated treatment well    Behavior During Therapy WFL for tasks assessed/performed           Past Medical History:  Diagnosis Date   ADD (attention deficit disorder)    Anemia    Anginal pain ([place])    pt states has occas chest pain relates to indigestion; pt uses rest to relieve    Anxiety    Arthritis    Concussion    Depression    Diabetes mellitus without complication (HCC)    Dizziness    Fall    GERD (gastroesophageal reflux disease)    Headache    History of urinary tract infection    Hyperlipidemia    Hypertension    Hypothyroidism    IBS (irritable bowel syndrome)    Imbalance    Numbness    right leg    Numbness in both hands    comes and goes    Pneumonia    last episode approx 1 year ago    Sleep apnea    Wears glasses     Past Surgical History:  Procedure Laterality Date   BREAST CYST ASPIRATION Left 1980's   neg   BREAST CYST EXCISION Right 1980's   neg   BREAST LUMPECTOMY Right    BREAST SURGERY     CARPAL TUNNEL RELEASE     CESAREAN SECTION     times 2   COLONOSCOPY WITH PROPOFOL N/A 05/11/2015   Procedure: COLONOSCOPY WITH PROPOFOL;  Surgeon: Manya Silvas, MD;  Location: [facility];  Service: Endoscopy;  Laterality: N/A;   DE QUERVAIN'S RELEASE Right     ESOPHAGOGASTRODUODENOSCOPY (EGD) WITH PROPOFOL N/A 05/11/2015   Procedure: ESOPHAGOGASTRODUODENOSCOPY (EGD) WITH PROPOFOL;  Surgeon: Manya Silvas, MD;  Location: [facility];  Service: Endoscopy;  Laterality: N/A;   EYE SURGERY     laser surgery bilat    HERNIA REPAIR     KNEE ARTHROSCOPY     LUMBAR LAMINECTOMY/DECOMPRESSION MICRODISCECTOMY Bilateral 09/14/2015   Procedure: MICRO LUMBAR BILATERAL DECOMPRESSION L4 - L5;  Surgeon: Susa Day, MD;  Location: [facility];  Service: Orthopedics;  Laterality: Bilateral;   REDUCTION MAMMAPLASTY Bilateral 1980   RHINOPLASTY     TONSILLECTOMY     TOTAL KNEE ARTHROPLASTY Left 06/18/2016   Procedure: LEFT TOTAL KNEE ARTHROPLASTY;  Surgeon: Paralee Cancel, MD;  Location: [facility];  Service: Orthopedics;  Laterality: Left;  Adductor Block   TUBAL LIGATION     UVULOPALATOPHARYNGOPLASTY      There were no vitals filed for this visit.   Subjective Assessment - 11/18/19 1310    Subjective Back, hip and knees are doing better than it was. Does her hip piriformis stretch which helps.   No back, hip, or knee pain  currently due to consistently doing the stretches.    Patient is accompained by: Family member    Pertinent History Low back pain. Pt states having back surgery about 2-3 years ago. After she had her L TKA, her back started aggravating her due to her walking. Pt also fell about 4 weeks ago onto her L knee. Dr. Alvan Dame checked out her L knee. L knee was fine but has some inflammation.  Pt tries to keep it iced. Pt still recovering for her L TKA but the fall set her back.   Her L knee surgery was last year.  L knee still bothers her a lot.  Had surgery for her back before her L knee surgery. Was doing well until her L knee surgery. The fall made it worse. Feels debilitating. Also has a hard time getting into and out of the car mainly due to her L knee.   Pt fell due to her L knee bucking on her. Pt was trying to pick something up from the side of the  couch.  Also has a hard time getting up from the floor.  Pt states having bladder accidents since after her fall (pt was recommended to tell her MD about it).  Bowel problems since taking medications (was constapated, but after taking medications, pt had diarrhea which was difficult to control. Getting out of the metformin fixed the bowel issue).  Pt states tingling and numbness L lateral LE along the L5 dermatome.  Denies saddle anesthesia.  Pt states that her back problem is mainly on her L side.  Pt landed on her L knee when she fell.  No other falls within the last 6 months. Pt also states being very dizzy since her L knee surgery.  Had PT treatement for her dizziness before which did not help.  The room feels like it is spinning when she gets dizzy.  I feel like it is getting better then she does something.  5/10 low back soreness currently (7/10 at most for the past 7 days after negotiating stairs, 5/10 without doing stairs). 4/10 L knee pain currenglty with gait 5/10 L knee pain at most for the past 7 days.    Patient Stated Goals Be better able to get into and out of her vehicle (midsized truck), into and out of chairs, be better able to roll in her bed with less L knee pain. Be able to get down on the floor and get back up.     Currently in Pain? No/denies    Pain Score 0-No pain              [facility] PT Assessment - 11/18/19 0001      Assessment   Referring Provider (PT) Paralee Cancel, MD                                 PT Education - 11/18/19 1512    Education Details ther-ex, Epley maneuver    Person(s) Educated Patient    Methods Explanation;Demonstration;Tactile cues;Verbal cues;Handout    Comprehension Verbalized understanding;Returned demonstration;Verbal cues required;Tactile cues required;Need further instruction           Objectives    MedbridgeAccess Code: [email or web address]  Latex free bands usedif used   Sitting posture: L trunk  rotation  Standing posture: slightLlateral shift   R knee exercises: Seated R hip extension isometrics, seated R hip ER, seated hip adduction isometrics, hip IR, R  piriformis stretch   Therapeutic exercise  Pt states that she wants to get a larger purse to put more stuff in. Pt was recommended to downsize so as her load is not too heavy for her back, hips and knees  After correct Epley maneuver 360 degree turn  R 3x mild dizziness.   L 3x. No dizziness  No backwards stumbling compared to previous sessions.   Reviewed plan of care: continue 8 weeks    Improved exercise technique, movement at target joints, use of target muscles after mod verbal, visual, tactile cues.    Canalith repositioning   Pt implied performing Epley for both sides at home instead of L side which is the side that tested positive previously. Pt was recommended to do Epley for L side only secondary to L side being positive and performing it to the R will undo her progress. Pt also implied that she does not wait until the dizziness subsides.   Corrected pt epley maneuver secondary to pt trying the Epley but sitting up instead of lying down observed  Epley maneuver for L side secondary to hx of positive L side. 3x  Increased time secondary to dizziness and for pt to perform exercise properly  Reviewed technique for pt to perform properly at home. Pt may need further instruction next visit if appropriate   Improved exercise technique, movement at target joints, use of target muscles after mod verbal, visual, tactile cues.        Response to treatment Pt tolerated session well without aggravation of symptoms.   Clinical impression Pt demonstrates decreased overall frequency and duration of L knee pain, improved ability to get into and out of the bath tub, as well as ability to turn 360 degrees without LOB (though pt still has some dizziness when turning to the R). Pt still demonstrates R  hip and knee pain in which stiffness may play a factor with pt reports of exercise/movement help alleviate her pain. Worked on performing the Epley maneuver properly today secondary to incorrect performance at home based on subjective reports. Overall, pt demonstrates some improvement in pain and prevention of overall regression of progress. Challenges to progress include memory difficulties from her fall onto her head 2 years ago, multiple areas of pain as well as chronicity of condition. Pt will benefit from continued skilled physical therapy services to maintain progress and prevent regression and to make sure pt is performing her exercises properly at home.       PT Short Term Goals - 11/18/19 1313      PT SHORT TERM GOAL #1   Title Patient will be independent with her HEP to help decrease back and L knee pain and improve ability to perform functional tasks.     Time 3    Period Weeks    Status Achieved    Target Date 07/02/19             PT Long Term Goals - 11/18/19 1313      PT LONG TERM GOAL #1   Title Patient will have a decrease in back pain to 3/10 or less at worst to promote ability to ambulate, turn in bed, perform standing tasks with less pain.     Baseline 6/10 back pain at most (09/24/2017); 7-8/10 at most for the past 7 days, duration of pain is better since starting PT (10/29/2017); 5-6/10 at most for the past 7 days (11/18/2017); 8/10 at most for the past 7 days but the duration  of pain is less compared to prior to starting PT (12/12/2017).  7/10 back pain at most for the past 7 days (pt states doing her exercises helps decrease her pain; 01/02/2018); walking (3/10), standing (4/10), bed mobility (0/10) 02/05/2018; 6/10 back pain at most for the past 7 days (03/03/2018); 6.5/10 back pain at most for the past 7 days (03/27/2018); 7-8/10 at most (05/08/2018); 7/10 back pain at worst (07/22/2018);  7-8/10 at most for the past 7 days (09/22/2018); 5-7/10 at worst for the past 7 days then  calms down (10/27/2018); 6/10 back pain at most for the past 7 days (when pt cooks and cleans up in kitchen; 12/17/2018); 6-7/10 back spams pain at most for the past 7 days, usually when she is doing stuff in the kitchen (01/29/2019); 7/10 at worst for the past 7 days (03/25/2019); 7.5/10 (05/20/19); 8/10 low back pain at most for the past 7 days (06/10/2019); 7/10 at worst; Easier to decrease back pain since starting PT (07/22/2019). 4-5/10 at most for the past 7 days (09/16/2019); 7/10 at most then does her stretches to help ease it off (11/18/2019)    Time 8    Period Weeks    Status On-going    Target Date 01/14/20      PT LONG TERM GOAL #2   Title Patient will have a decrease in L knee pain to 3/10 or less at worst to promote ability to ambulate, negotiate stairs, get into and out of a car more comfortably.     Baseline 6/10 L knee pain at most for the past 3 months (09/24/2017); 7-8/10 at most for the past 7 days, duration of pain is better since starting PT (10/29/2017); 7/10 at most for the past 7 days (11/18/2017); 6/10 L knee pain at most for the past 7 days (12/12/2017); 5/10 L knee pain at most for the past 7 days (pt states doing her exercises helps decrease pain; 01/02/2018); L knee 3/10 (02/05/2018); 4/10 at most for the past 7 days (03/03/2018); 5.5/10 L knee at most for the past 7 days (03/27/2018); 6-7/10 L knee pain at most (05/08/2018); 7/10 L knee pain at worst for the past month (07/22/2018);  6/10 L knee pain at most for the past 7 days (09/22/2018); 5-6/10 at most for the past 7 days sporadically, can get it better with exercise (10/27/2018); 6/10 L knee ache at most for the past 7 days (12/17/2018); 5-6/10 L knee pain at most for the past 7 days such as going up and down steps (01/29/2019); 5/10 at most for the past 7 days (03/25/2019); 5.5/10 (05/20/19); 4/10 (06/10/2019); 4/10 (07/22/2019); 4/10 L knee pain at most for the past 7 days (09/16/2019); 7/10 at worst such as during stair negotiaton or walking on  uneven surfaces. Frequency and duraction of worst pain level is less. (11/18/2019)    Time 8    Period Weeks    Status Partially Met    Target Date 01/14/20      PT LONG TERM GOAL #3   Title Patient will improve TUG time to 12 seconds or less as a demonstration of improved functional mobility and balance.     Baseline TUG no AD: 16.05 seconds on average (09/24/2017); 14.3 seconds on average (10/29/2017); 13 seconds average (11/21/2017); 16.67 seconds average (12/12/2017);  14.17 seconds average (01/02/2018); 11.8 sec sonds (02/05/2018)    Time 6    Period Weeks    Status Achieved      PT LONG TERM GOAL #4  Title Patient will improve her back FOTO score by at least 10 points as a demonstration of improved funtion.      Baseline Back FOTO: 33 (09/24/2017); 38 (10/29/2017); 36 (11/21/2017); 40 (12/12/2017); 42 (01/02/2018); 47 (03/03/2018)    Time 6    Period Weeks    Status Achieved      PT LONG TERM GOAL #5   Title Patient will improve bilateral LE strength by at least 1/2 MMT grade to promote ability to perform standing tasks.     Time 6    Period Weeks    Status Achieved      PT LONG TERM GOAL #6   Title Patient will improve her Modified Oswestry Low Back pain disablity questionnaire by at least 10% as a demonstration of improved function.     Baseline 48% (09/24/2017); 46% (10/29/2017), (11/21/2017); 56% (12/12/2017); 36% (01/02/2018), (03/03/2018)    Time 6    Period Weeks    Status Achieved      PT LONG TERM GOAL #7   Title Pt will report decreased difficulty stepping into and out of her bathtub as well as getting into and out of her truck to promote ability to get into and out of places.     Baseline Pt reports difficulty stepping into and out of her bathtub as well as difficulty getting into and out of her truck (01/02/2018); Pt states the truck and bath tub and difficult and painful.  (02/05/2018); Improving per subjective reports (03/03/2018); decreased difficulty with truck tranfser after cues  for technique and practice, decreasing difficulty with stepping into and out of the bath tub based on subjective reports (03/27/2018 and 04/01/2018); Decreasing difficulty stepping into a bathtub. No difficulty getting into and out of her truck (05/08/2018), (07/22/2018); Difficulty stepping into and out of the bath tub. No difficulty with truck transfers (09/22/2018); Able to get into her bath tub, difficulty getting out (10/27/18); Still has difficulty getting into and out of bathtub (02/23/2019); Better able to step into the bathtub. Still has difficulty stepping out of the bathtub due to knee flexion (03/25/2019); No difficulty stepping into her bath tub. Still has difficulty stepping out due to decreased L knee flexion ROM (05/20/2019), (06/10/2019), (07/22/2019), (09/16/2019). Getting into and out of her bath tub to stand and take a shower is easier (11/18/2019)    Time 8    Period Weeks    Status Achieved      PT LONG TERM GOAL #8   Title Patient will improve her BERG balance test score to 46/56 or more as a demonstration of decreased fall risk     Baseline 42/56 (01/02/2018); 54/56 (02/05/2018)    Time 6    Period Weeks    Status Achieved      PT LONG TERM GOAL  #9   TITLE Pt will report minimal to no dizziness related to vertigo to promote balance and decrease fall risk.     Baseline Pt reports dizziness (related to vertigo) which seem to affect her balance (01/15/2018); Patient reports "a little bit every now and then" (02/05/2018); Pt reports dizziness and decreased balance (01/29/2019); Dizziness has improved some (02/23/2019), (03/25/2019); Still has dizziness and vertigo; does the Epley for both sides instead of just the L side (06/10/2019), Dizziness has decreased. Does Epley for L side (09/16/2019), Pt still has dizziness but does her Epley for both sides. Pt was recommended to do Epley for L side only secondary to L side being positive and performing it to  the R will undo her progress. (11/18/2019)     Time 8    Period Weeks    Status On-going    Target Date 01/13/20      PT LONG TERM GOAL  #10   TITLE Pt will be able to turn 360 degrees to the R and L at least 3 times without dizziness and no LOB to promote safety and decrease fall risk.    Baseline 360 degree turn to the R and L causes a loss of balance (12/17/2018), (01/29/2019). able to perform 3 times with each side with improved balance, minimal dizziness. (02/23/2019); Dizziness (05/20/19), slight dizziness (06/10/2019), (07/22/2019), (09/16/2019). Dizziness for R turn, no dizziness with L turn, no LOB (11/18/2019).    Time 8    Period Weeks    Status On-going    Target Date 01/14/20      PT LONG TERM GOAL  #11   TITLE Patient will have a decrease in R hip and knee pain to 4/10 or less at worst to promote ability to ambulate with less discomfort.    Baseline 7/10 R hip and knee pain at worst for the past month (03/25/2019); 5/10 R hip pain, 6/10 R knee pain at most for the past 7 days (05/20/19); 5.5/10 R knee pain (05/20/19); 8/10 R hip; 6/10 R knee at most for the past 7 days (06/10/2019); 7/10, easier to decrease pain since starting PT (07/22/2019); 6/10  R hip, and 6/10 R knee at most for the past 7 days (09/16/2019); 6/10 R hip and knee at most for the past 7 days secondary to being stiff. Moving more feels better.  (11/18/2019)    Time 8    Period Weeks    Status On-going    Target Date 01/14/20                 Plan - 11/18/19 1512    Clinical Impression Statement Pt demonstrates decreased overall frequency and duration of L knee pain, improved ability to get into and out of the bath tub, as well as ability to turn 360 degrees without LOB (though pt still has some dizziness when turning to the R). Pt still demonstrates R hip and knee pain in which stiffness may play a factor with pt reports of exercise/movement help alleviate her pain. Worked on performing the Epley maneuver properly today secondary to incorrect performance at home  based on subjective reports. Overall, pt demonstrates some improement in pain and prevention of overall regression of progress. Challenges to progress include memory difficulties from her fall onto her head 2 years ago, multiple areas of pain as well as chronicity of condition. Pt will benefit from continued skilled physical therapy services to maintain progress and prevent regression and to make sure pt is performing her exercises properly at home.    Personal Factors and Comorbidities Age;Comorbidity 3+;Time since onset of injury/illness/exacerbation;Fitness;Past/Current Experience    Comorbidities Back, knee pain, hx of dizziness, hx of back surgery, hx of C-section; memory lapse    Examination-Activity Limitations Squat    Stability/Clinical Decision Making Stable/Uncomplicated    Clinical Decision Making Low    Rehab Potential Fair    Clinical Impairments Affecting Rehab Potential difficulty with carry-over of improvements in clinic, chronicity of condition, age, multiple problem areas; MEMORY lapse    PT Frequency 1x / week    PT Duration 8 weeks    PT Treatment/Interventions Therapeutic activities;Therapeutic exercise;Balance training;Neuromuscular re-education;Patient/family education;Manual techniques;Dry needling;Aquatic Therapy;Electrical Stimulation;Iontophoresis 62m/ml Dexamethasone;Gait training;Canalith Repostioning;Vestibular  PT Next Visit Plan  hip and knee strengthening, core strengthening, patellar mobility, manual techniques, modalities PRN    PT Home Exercise Plan Medbridge Access Code: [email or web address]    Consulted and Agree with Plan of Care Patient           Patient will benefit from skilled therapeutic intervention in order to improve the following deficits and impairments:  Pain, Improper body mechanics, Postural dysfunction, Dizziness, Decreased strength, Difficulty walking, Decreased balance  Visit Diagnosis: Muscle weakness (generalized) - Plan: PT plan of care  cert/re-cert  Right knee pain, unspecified chronicity - Plan: PT plan of care cert/re-cert  Radiculopathy, lumbar region - Plan: PT plan of care cert/re-cert  Pain in right hip - Plan: PT plan of care cert/re-cert  History of falling - Plan: PT plan of care cert/re-cert  Dizziness and giddiness - Plan: PT plan of care cert/re-cert  Chronic pain of left knee - Plan: PT plan of care cert/re-cert     Problem List Patient Active Problem List   Diagnosis Date Noted   Obese 06/20/2016   Status post right knee replacement 06/19/2016   S/P left TKA 06/18/2016   Spinal stenosis of lumbar region 09/14/2015    Thank you for your referral.   Joneen Boers PT, DPT   11/18/2019, 3:25 PM  [facility] [address] [place], [address] Phone: [phone]   Fax:  [phone]  Name: Destiny Shaw MRN: [id number] Date of Birth: 1950-07-24

## 2019-11-25 ENCOUNTER — Other Ambulatory Visit: Payer: Self-pay

## 2019-11-25 ENCOUNTER — Ambulatory Visit: Payer: Medicare Other

## 2019-11-25 DIAGNOSIS — M25561 Pain in right knee: Secondary | ICD-10-CM

## 2019-11-25 DIAGNOSIS — M6281 Muscle weakness (generalized): Secondary | ICD-10-CM

## 2019-11-25 NOTE — Therapy (Signed)
[facility] [address], [place], [address] Phone: [phone]   Fax:  [phone]  Physical Therapy Treatment  Patient Details  Name: Destiny Shaw MRN: [id number] Date of Birth: 1950/10/25 Referring Provider (PT): Paralee Cancel, MD   Encounter Date: 11/25/2019   PT End of Session - 11/25/19 1300    Visit Number [id number]    Number of Visits 118    Date for PT Re-Evaluation 01/14/20    Authorization Type 8    Authorization Time Period of 10 progress note Medicare    PT Start Time 1301    PT Stop Time 1341    PT Time Calculation (min) 40 min    Activity Tolerance Patient tolerated treatment well    Behavior During Therapy [facility] for tasks assessed/performed           Past Medical History:  Diagnosis Date  . ADD (attention deficit disorder)   . Anemia   . Anginal pain ([place])    pt states has occas chest pain relates to indigestion; pt uses rest to relieve   . Anxiety   . Arthritis   . Concussion   . Depression   . Diabetes mellitus without complication ([place])   . Dizziness   . Fall   . GERD (gastroesophageal reflux disease)   . Headache   . History of urinary tract infection   . Hyperlipidemia   . Hypertension   . Hypothyroidism   . IBS (irritable bowel syndrome)   . Imbalance   . Numbness    right leg   . Numbness in both hands    comes and goes   . Pneumonia    last episode approx 1 year ago   . Sleep apnea   . Wears glasses     Past Surgical History:  Procedure Laterality Date  . BREAST CYST ASPIRATION Left 1980's   neg  . BREAST CYST EXCISION Right 1980's   neg  . BREAST LUMPECTOMY Right   . BREAST SURGERY    . CARPAL TUNNEL RELEASE    . CESAREAN SECTION     times 2  . COLONOSCOPY WITH PROPOFOL N/A 05/11/2015   Procedure: COLONOSCOPY WITH PROPOFOL;  Surgeon: Manya Silvas, MD;  Location: [facility] ENDOSCOPY;  Service: Endoscopy;  Laterality: N/A;  . DE QUERVAIN'S RELEASE Right   .  ESOPHAGOGASTRODUODENOSCOPY (EGD) WITH PROPOFOL N/A 05/11/2015   Procedure: ESOPHAGOGASTRODUODENOSCOPY (EGD) WITH PROPOFOL;  Surgeon: Manya Silvas, MD;  Location: [facility] ENDOSCOPY;  Service: Endoscopy;  Laterality: N/A;  . EYE SURGERY     laser surgery bilat   . HERNIA REPAIR    . KNEE ARTHROSCOPY    . LUMBAR LAMINECTOMY/DECOMPRESSION MICRODISCECTOMY Bilateral 09/14/2015   Procedure: MICRO LUMBAR BILATERAL DECOMPRESSION L4 - L5;  Surgeon: Susa Day, MD;  Location: [facility] ORS;  Service: Orthopedics;  Laterality: Bilateral;  . REDUCTION MAMMAPLASTY Bilateral 1980  . RHINOPLASTY    . TONSILLECTOMY    . TOTAL KNEE ARTHROPLASTY Left 06/18/2016   Procedure: LEFT TOTAL KNEE ARTHROPLASTY;  Surgeon: Paralee Cancel, MD;  Location: [facility] ORS;  Service: Orthopedics;  Laterality: Left;  Adductor Block  . TUBAL LIGATION    . UVULOPALATOPHARYNGOPLASTY      There were no vitals filed for this visit.   Subjective Assessment - 11/25/19 1303    Subjective R knee bothers her. 7.5/10 R knee pain currently. 5.5/10 R hip currently. Her dizziness is better.  Pt states that her body tends to  lock up and PT helps her body keep moving.    Patient is accompained by: Family member    Pertinent History Low back pain. Pt states having back surgery about 2-3 years ago. After she had her L TKA, her back started aggravating her due to her walking. Pt also fell about 4 weeks ago onto her L knee. Dr. Alvan Dame checked out her L knee. L knee was fine but has some inflammation.  Pt tries to keep it iced. Pt still recovering for her L TKA but the fall set her back.   Her L knee surgery was last year.  L knee still bothers her a lot.  Had surgery for her back before her L knee surgery. Was doing well until her L knee surgery. The fall made it worse. Feels debilitating. Also has a hard time getting into and out of the car mainly due to her L knee.   Pt fell due to her L knee bucking on her. Pt was trying to pick something up from the side of the  couch.  Also has a hard time getting up from the floor.  Pt states having bladder accidents since after her fall (pt was recommended to tell her MD about it).  Bowel problems since taking medications (was constapated, but after taking medications, pt had diarrhea which was difficult to control. Getting out of the metformin fixed the bowel issue).  Pt states tingling and numbness L lateral LE along the L5 dermatome.  Denies saddle anesthesia.  Pt states that her back problem is mainly on her L side.  Pt landed on her L knee when she fell.  No other falls within the last 6 months. Pt also states being very dizzy since her L knee surgery.  Had PT treatement for her dizziness before which did not help.  The room feels like it is spinning when she gets dizzy.  I feel like it is getting better then she does something.  5/10 low back soreness currently (7/10 at most for the past 7 days after negotiating stairs, 5/10 without doing stairs). 4/10 L knee pain currenglty with gait 5/10 L knee pain at most for the past 7 days.    Patient Stated Goals Be better able to get into and out of her vehicle (midsized truck), into and out of chairs, be better able to roll in her bed with less L knee pain. Be able to get down on the floor and get back up.     Currently in Pain? Yes    Pain Score 8     Pain Location Knee    Pain Orientation Right                                     PT Education - 11/25/19 1307    Education Details ther-ex    Person(s) Educated Patient    Methods Explanation;Demonstration;Tactile cues;Verbal cues    Comprehension Returned demonstration;Verbalized understanding            Objectives   MedbridgeAccess Code: [email or web address]  Latex free bands usedif used   Sitting posture: L trunk rotation  Standing posture: slightLlateral shift   R knee exercises: Seated R hip extension isometrics, seated R hip ER, seated hip adduction isometrics, hip IR, R  piriformis stretch   Therapeutic exercise   Seated R hip ER 10x3 with 5 second holds  Seated R hip IR stretch  30 seconds x 3             Followed by seated hip IR              R 10x2  Seated R hip extension isometrics 10x5 seconds for 2 sets, then 5x5 seconds  L medial thigh discomfort which eases with rest.   seated hip adduction isometrics ball and glute max squeeze 10x10 seconds for 2 sets  Standing B ankle DF/PF with B UE assist 2 minutes   Static mini lunge with one UE assist   R 10x2  L 10x2  Seated B ankle DF/PF 10x3  standing R gastroc stretch 30 seconds x 3    Improved exercise technique, movement at target joints, use of target muscles aftermin tomod verbal, visual, tactile cues.   Response to treatment Pt states R knee feels a whole lot better after session.    Clinical impression Decreased R knee pain during stance phase with treatment to improve R ankle DF ROM to decrease anterior knee stress during stance phase of gait. Pt will benefit from continued skilled physical therapy services to decrease pain, improve strength and function.       PT Short Term Goals - 11/18/19 1313      PT SHORT TERM GOAL #1   Title Patient will be independent with her HEP to help decrease back and L knee pain and improve ability to perform functional tasks.     Time 3    Period Weeks    Status Achieved    Target Date 07/02/19             PT Long Term Goals - 11/18/19 1313      PT LONG TERM GOAL #1   Title Patient will have a decrease in back pain to 3/10 or less at worst to promote ability to ambulate, turn in bed, perform standing tasks with less pain.     Baseline 6/10 back pain at most (09/24/2017); 7-8/10 at most for the past 7 days, duration of pain is better since starting PT (10/29/2017); 5-6/10 at most for the past 7 days (11/18/2017); 8/10 at most for the past 7 days but the duration of pain is less compared to prior to starting PT  (12/12/2017).  7/10 back pain at most for the past 7 days (pt states doing her exercises helps decrease her pain; 01/02/2018); walking (3/10), standing (4/10), bed mobility (0/10) 02/05/2018; 6/10 back pain at most for the past 7 days (03/03/2018); 6.5/10 back pain at most for the past 7 days (03/27/2018); 7-8/10 at most (05/08/2018); 7/10 back pain at worst (07/22/2018);  7-8/10 at most for the past 7 days (09/22/2018); 5-7/10 at worst for the past 7 days then calms down (10/27/2018); 6/10 back pain at most for the past 7 days (when pt cooks and cleans up in kitchen; 12/17/2018); 6-7/10 back spams pain at most for the past 7 days, usually when she is doing stuff in the kitchen (01/29/2019); 7/10 at worst for the past 7 days (03/25/2019); 7.5/10 (05/20/19); 8/10 low back pain at most for the past 7 days (06/10/2019); 7/10 at worst; Easier to decrease back pain since starting PT (07/22/2019). 4-5/10 at most for the past 7 days (09/16/2019); 7/10 at most then does her stretches to help ease it off (11/18/2019)    Time 8    Period Weeks    Status On-going    Target Date 01/14/20      PT LONG TERM GOAL #2  Title Patient will have a decrease in L knee pain to 3/10 or less at worst to promote ability to ambulate, negotiate stairs, get into and out of a car more comfortably.     Baseline 6/10 L knee pain at most for the past 3 months (09/24/2017); 7-8/10 at most for the past 7 days, duration of pain is better since starting PT (10/29/2017); 7/10 at most for the past 7 days (11/18/2017); 6/10 L knee pain at most for the past 7 days (12/12/2017); 5/10 L knee pain at most for the past 7 days (pt states doing her exercises helps decrease pain; 01/02/2018); L knee 3/10 (02/05/2018); 4/10 at most for the past 7 days (03/03/2018); 5.5/10 L knee at most for the past 7 days (03/27/2018); 6-7/10 L knee pain at most (05/08/2018); 7/10 L knee pain at worst for the past month (07/22/2018);  6/10 L knee pain at most for the past 7 days (09/22/2018);  5-6/10 at most for the past 7 days sporadically, can get it better with exercise (10/27/2018); 6/10 L knee ache at most for the past 7 days (12/17/2018); 5-6/10 L knee pain at most for the past 7 days such as going up and down steps (01/29/2019); 5/10 at most for the past 7 days (03/25/2019); 5.5/10 (05/20/19); 4/10 (06/10/2019); 4/10 (07/22/2019); 4/10 L knee pain at most for the past 7 days (09/16/2019); 7/10 at worst such as during stair negotiaton or walking on uneven surfaces. Frequency and duraction of worst pain level is less. (11/18/2019)    Time 8    Period Weeks    Status Partially Met    Target Date 01/14/20      PT LONG TERM GOAL #3   Title Patient will improve TUG time to 12 seconds or less as a demonstration of improved functional mobility and balance.     Baseline TUG no AD: 16.05 seconds on average (09/24/2017); 14.3 seconds on average (10/29/2017); 13 seconds average (11/21/2017); 16.67 seconds average (12/12/2017);  14.17 seconds average (01/02/2018); 11.8 sec sonds (02/05/2018)    Time 6    Period Weeks    Status Achieved      PT LONG TERM GOAL #4   Title Patient will improve her back FOTO score by at least 10 points as a demonstration of improved funtion.      Baseline Back FOTO: 33 (09/24/2017); 38 (10/29/2017); 36 (11/21/2017); 40 (12/12/2017); 42 (01/02/2018); 47 (03/03/2018)    Time 6    Period Weeks    Status Achieved      PT LONG TERM GOAL #5   Title Patient will improve bilateral LE strength by at least 1/2 MMT grade to promote ability to perform standing tasks.     Time 6    Period Weeks    Status Achieved      PT LONG TERM GOAL #6   Title Patient will improve her Modified Oswestry Low Back pain disablity questionnaire by at least 10% as a demonstration of improved function.     Baseline 48% (09/24/2017); 46% (10/29/2017), (11/21/2017); 56% (12/12/2017); 36% (01/02/2018), (03/03/2018)    Time 6    Period Weeks    Status Achieved      PT LONG TERM GOAL #7   Title Pt will report  decreased difficulty stepping into and out of her bathtub as well as getting into and out of her truck to promote ability to get into and out of places.     Baseline Pt reports difficulty stepping into and out  of her bathtub as well as difficulty getting into and out of her truck (01/02/2018); Pt states the truck and bath tub and difficult and painful.  (02/05/2018); Improving per subjective reports (03/03/2018); decreased difficulty with truck tranfser after cues for technique and practice, decreasing difficulty with stepping into and out of the bath tub based on subjective reports (03/27/2018 and 04/01/2018); Decreasing difficulty stepping into a bathtub. No difficulty getting into and out of her truck (05/08/2018), (07/22/2018); Difficulty stepping into and out of the bath tub. No difficulty with truck transfers (09/22/2018); Able to get into her bath tub, difficulty getting out (10/27/18); Still has difficulty getting into and out of bathtub (02/23/2019); Better able to step into the bathtub. Still has difficulty stepping out of the bathtub due to knee flexion (03/25/2019); No difficulty stepping into her bath tub. Still has difficulty stepping out due to decreased L knee flexion ROM (05/20/2019), (06/10/2019), (07/22/2019), (09/16/2019). Getting into and out of her bath tub to stand and take a shower is easier (11/18/2019)    Time 8    Period Weeks    Status Achieved      PT LONG TERM GOAL #8   Title Patient will improve her BERG balance test score to 46/56 or more as a demonstration of decreased fall risk     Baseline 42/56 (01/02/2018); 54/56 (02/05/2018)    Time 6    Period Weeks    Status Achieved      PT LONG TERM GOAL  #9   TITLE Pt will report minimal to no dizziness related to vertigo to promote balance and decrease fall risk.     Baseline Pt reports dizziness (related to vertigo) which seem to affect her balance (01/15/2018); Patient reports "a little bit every now and then" (02/05/2018); Pt reports  dizziness and decreased balance (01/29/2019); Dizziness has improved some (02/23/2019), (03/25/2019); Still has dizziness and vertigo; does the Epley for both sides instead of just the L side (06/10/2019), Dizziness has decreased. Does Epley for L side (09/16/2019), Pt still has dizziness but does her Epley for both sides. Pt was recommended to do Epley for L side only secondary to L side being positive and performing it to the R will undo her progress. (11/18/2019)    Time 8    Period Weeks    Status On-going    Target Date 01/13/20      PT LONG TERM GOAL  #10   TITLE Pt will be able to turn 360 degrees to the R and L at least 3 times without dizziness and no LOB to promote safety and decrease fall risk.    Baseline 360 degree turn to the R and L causes a loss of balance (12/17/2018), (01/29/2019). able to perform 3 times with each side with improved balance, minimal dizziness. (02/23/2019); Dizziness (05/20/19), slight dizziness (06/10/2019), (07/22/2019), (09/16/2019). Dizziness for R turn, no dizziness with L turn, no LOB (11/18/2019).    Time 8    Period Weeks    Status On-going    Target Date 01/14/20      PT LONG TERM GOAL  #11   TITLE Patient will have a decrease in R hip and knee pain to 4/10 or less at worst to promote ability to ambulate with less discomfort.    Baseline 7/10 R hip and knee pain at worst for the past month (03/25/2019); 5/10 R hip pain, 6/10 R knee pain at most for the past 7 days (05/20/19); 5.5/10 R knee pain (05/20/19); 8/10 R  hip; 6/10 R knee at most for the past 7 days (06/10/2019); 7/10, easier to decrease pain since starting PT (07/22/2019); 6/10  R hip, and 6/10 R knee at most for the past 7 days (09/16/2019); 6/10 R hip and knee at most for the past 7 days secondary to being stiff. Moving more feels better.  (11/18/2019)    Time 8    Period Weeks    Status On-going    Target Date 01/14/20                 Plan - 11/25/19 1300    Clinical Impression Statement  Decreased R knee pain during stance phase with treatment to improve R ankle DF ROM to decrease anterior knee stress during stance phase of gait. Pt will benefit from continued skilled physical therapy services to decrease pain, improve strength and function.    Personal Factors and Comorbidities Age;Comorbidity 3+;Time since onset of injury/illness/exacerbation;Fitness;Past/Current Experience    Comorbidities Back, knee pain, hx of dizziness, hx of back surgery, hx of C-section; memory lapse    Examination-Activity Limitations Squat    Stability/Clinical Decision Making Stable/Uncomplicated    Rehab Potential Fair    Clinical Impairments Affecting Rehab Potential difficulty with carry-over of improvements in clinic, chronicity of condition, age, multiple problem areas; MEMORY lapse    PT Frequency 1x / week    PT Duration 8 weeks    PT Treatment/Interventions Therapeutic activities;Therapeutic exercise;Balance training;Neuromuscular re-education;Patient/family education;Manual techniques;Dry needling;Aquatic Therapy;Electrical Stimulation;Iontophoresis 1m/ml Dexamethasone;Gait training;Canalith Repostioning;Vestibular    PT Next Visit Plan  hip and knee strengthening, core strengthening, patellar mobility, manual techniques, modalities PRN    PT Home Exercise Plan Medbridge Access Code: [email or web address]    Consulted and Agree with Plan of Care Patient           Patient will benefit from skilled therapeutic intervention in order to improve the following deficits and impairments:  Pain, Improper body mechanics, Postural dysfunction, Dizziness, Decreased strength, Difficulty walking, Decreased balance  Visit Diagnosis: Muscle weakness (generalized)  Right knee pain, unspecified chronicity     Problem List Patient Active Problem List   Diagnosis Date Noted  . Obese 06/20/2016  . Status post right knee replacement 06/19/2016  . S/P left TKA 06/18/2016  . Spinal stenosis of lumbar region  09/14/2015    MJoneen BoersPT, DPT   11/25/2019, 3:49 PM  [facility] [address] [place] [address]Phone: [phone]  Fax:  [phone] Name: RJEMMIE LEDGERWOODMRN: [id number] of Birth: [birth year]

## 2019-12-02 ENCOUNTER — Ambulatory Visit: Payer: Medicare Other | Attending: Orthopedic Surgery

## 2019-12-02 ENCOUNTER — Other Ambulatory Visit: Payer: Self-pay

## 2019-12-02 DIAGNOSIS — M6281 Muscle weakness (generalized): Secondary | ICD-10-CM | POA: Diagnosis present

## 2019-12-02 DIAGNOSIS — M25551 Pain in right hip: Secondary | ICD-10-CM | POA: Insufficient documentation

## 2019-12-02 DIAGNOSIS — M5416 Radiculopathy, lumbar region: Secondary | ICD-10-CM

## 2019-12-02 DIAGNOSIS — Z9181 History of falling: Secondary | ICD-10-CM | POA: Diagnosis present

## 2019-12-02 DIAGNOSIS — M25561 Pain in right knee: Secondary | ICD-10-CM

## 2019-12-02 NOTE — Therapy (Signed)
[facility] [address] [place], [address] Phone: [phone]   Fax:  [phone]  Physical Therapy Treatment  Patient Details  Name: Destiny Shaw MRN: [id number] Date of Birth: 1950/12/30 Referring Provider (PT): Paralee Cancel, MD   Encounter Date: 12/02/2019   PT End of Session - 12/02/19 1351    Visit Number 47    Number of Visits 118    Date for PT Re-Evaluation 01/14/20    Authorization Type 9    Authorization Time Period of 10 progress note Medicare    PT Start Time 1351    PT Stop Time 1432    PT Time Calculation (min) 41 min    Activity Tolerance Patient tolerated treatment well    Behavior During Therapy [facility] for tasks assessed/performed           Past Medical History:  Diagnosis Date  . ADD (attention deficit disorder)   . Anemia   . Anginal pain ([place])    pt states has occas chest pain relates to indigestion; pt uses rest to relieve   . Anxiety   . Arthritis   . Concussion   . Depression   . Diabetes mellitus without complication ([place])   . Dizziness   . Fall   . GERD (gastroesophageal reflux disease)   . Headache   . History of urinary tract infection   . Hyperlipidemia   . Hypertension   . Hypothyroidism   . IBS (irritable bowel syndrome)   . Imbalance   . Numbness    right leg   . Numbness in both hands    comes and goes   . Pneumonia    last episode approx 1 year ago   . Sleep apnea   . Wears glasses     Past Surgical History:  Procedure Laterality Date  . BREAST CYST ASPIRATION Left 1980's   neg  . BREAST CYST EXCISION Right 1980's   neg  . BREAST LUMPECTOMY Right   . BREAST SURGERY    . CARPAL TUNNEL RELEASE    . CESAREAN SECTION     times 2  . COLONOSCOPY WITH PROPOFOL N/A 05/11/2015   Procedure: COLONOSCOPY WITH PROPOFOL;  Surgeon: Manya Silvas, MD;  Location: [facility] ENDOSCOPY;  Service: Endoscopy;  Laterality: N/A;  . DE QUERVAIN'S RELEASE Right   .  ESOPHAGOGASTRODUODENOSCOPY (EGD) WITH PROPOFOL N/A 05/11/2015   Procedure: ESOPHAGOGASTRODUODENOSCOPY (EGD) WITH PROPOFOL;  Surgeon: Manya Silvas, MD;  Location: [facility] ENDOSCOPY;  Service: Endoscopy;  Laterality: N/A;  . EYE SURGERY     laser surgery bilat   . HERNIA REPAIR    . KNEE ARTHROSCOPY    . LUMBAR LAMINECTOMY/DECOMPRESSION MICRODISCECTOMY Bilateral 09/14/2015   Procedure: MICRO LUMBAR BILATERAL DECOMPRESSION L4 - L5;  Surgeon: Susa Day, MD;  Location: [facility] ORS;  Service: Orthopedics;  Laterality: Bilateral;  . REDUCTION MAMMAPLASTY Bilateral 1980  . RHINOPLASTY    . TONSILLECTOMY    . TOTAL KNEE ARTHROPLASTY Left 06/18/2016   Procedure: LEFT TOTAL KNEE ARTHROPLASTY;  Surgeon: Paralee Cancel, MD;  Location: [facility] ORS;  Service: Orthopedics;  Laterality: Left;  Adductor Block  . TUBAL LIGATION    . UVULOPALATOPHARYNGOPLASTY      There were no vitals filed for this visit.   Subjective Assessment - 12/02/19 1354    Subjective Not doing well. R knee is hurting so bad. Uses a walker some times, especially when she stands up after sitting for a while. Feels  better when she moves it.    Patient is accompained by: Family member    Pertinent History Low back pain. Pt states having back surgery about 2-3 years ago. After she had her L TKA, her back started aggravating her due to her walking. Pt also fell about 4 weeks ago onto her L knee. Dr. Alvan Dame checked out her L knee. L knee was fine but has some inflammation.  Pt tries to keep it iced. Pt still recovering for her L TKA but the fall set her back.   Her L knee surgery was last year.  L knee still bothers her a lot.  Had surgery for her back before her L knee surgery. Was doing well until her L knee surgery. The fall made it worse. Feels debilitating. Also has a hard time getting into and out of the car mainly due to her L knee.   Pt fell due to her L knee bucking on her. Pt was trying to pick something up from the side of the couch.  Also has a  hard time getting up from the floor.  Pt states having bladder accidents since after her fall (pt was recommended to tell her MD about it).  Bowel problems since taking medications (was constapated, but after taking medications, pt had diarrhea which was difficult to control. Getting out of the metformin fixed the bowel issue).  Pt states tingling and numbness L lateral LE along the L5 dermatome.  Denies saddle anesthesia.  Pt states that her back problem is mainly on her L side.  Pt landed on her L knee when she fell.  No other falls within the last 6 months. Pt also states being very dizzy since her L knee surgery.  Had PT treatement for her dizziness before which did not help.  The room feels like it is spinning when she gets dizzy.  I feel like it is getting better then she does something.  5/10 low back soreness currently (7/10 at most for the past 7 days after negotiating stairs, 5/10 without doing stairs). 4/10 L knee pain currenglty with gait 5/10 L knee pain at most for the past 7 days.    Patient Stated Goals Be better able to get into and out of her vehicle (midsized truck), into and out of chairs, be better able to roll in her bed with less L knee pain. Be able to get down on the floor and get back up.     Currently in Pain? Yes    Pain Score 7     Pain Location Knee    Pain Orientation Right                                     PT Education - 12/02/19 1404    Education Details ther-ex    Person(s) Educated Patient    Methods Explanation;Demonstration;Tactile cues;Verbal cues    Comprehension Returned demonstration;Verbalized understanding          Objectives   MedbridgeAccess Code: [email or web address]  Latex free bands usedif used   Sitting posture: L trunk rotation  Standing posture: slightLlateral shift   R knee exercises: Seated R hip extension isometrics, seated R hip ER, seated hip adduction isometrics, hip IR, R piriformis  stretch   Pt observed to ambulate with SPC on R side.   Therapeutic exercise  Gait with SPC on L side to counter R knee pain,  cues for heel strike 200 ft.   Decreased R knee pain  Nu Step seat 6, arms 7 at level 1 for joint nutrition.for 5 min, at at least 30 SPM   standing gastroc stretch on slant board with B UE assist 30 seconds x 3  Seated R hip ER 10x3 with 5 second holds  Seated R knee flexion targeting the medial hamstrings red band 10x3  Seated piriformis stretch   30 seconds x 3  Seated hip IR   R 10x3  Seated R tibial IR 10x3  Feels good per pt   seated hip adduction isometricsball and glute max squeeze 10x10 seconds for 2 sets  Improved exercise technique, movement at target joints, use of target muscles aftermin tomod verbal, visual, tactile cues.   Response to treatment Decreased R knee pain during gait after session.    Clinical impression Decreased R knee pain during gait with movement as well as increasing ankle DF during heel strike. Added Nustep today for joint nutrition at her knee joint, cues for maintaining at least 50 steps per minute. Continued working on improving glute and medial hamstring strength as well as hip IR to promote better mechanics at her R knee joint. Decreased R knee pain during gait reported by pt after session. Pt will benefit from continued skilled physical therapy services to decrease pain, improve strength and function.         PT Short Term Goals - 11/18/19 1313      PT SHORT TERM GOAL #1   Title Patient will be independent with her HEP to help decrease back and L knee pain and improve ability to perform functional tasks.     Time 3    Period Weeks    Status Achieved    Target Date 07/02/19             PT Long Term Goals - 11/18/19 1313      PT LONG TERM GOAL #1   Title Patient will have a decrease in back pain to 3/10 or less at worst to promote ability to ambulate, turn in bed, perform standing  tasks with less pain.     Baseline 6/10 back pain at most (09/24/2017); 7-8/10 at most for the past 7 days, duration of pain is better since starting PT (10/29/2017); 5-6/10 at most for the past 7 days (11/18/2017); 8/10 at most for the past 7 days but the duration of pain is less compared to prior to starting PT (12/12/2017).  7/10 back pain at most for the past 7 days (pt states doing her exercises helps decrease her pain; 01/02/2018); walking (3/10), standing (4/10), bed mobility (0/10) 02/05/2018; 6/10 back pain at most for the past 7 days (03/03/2018); 6.5/10 back pain at most for the past 7 days (03/27/2018); 7-8/10 at most (05/08/2018); 7/10 back pain at worst (07/22/2018);  7-8/10 at most for the past 7 days (09/22/2018); 5-7/10 at worst for the past 7 days then calms down (10/27/2018); 6/10 back pain at most for the past 7 days (when pt cooks and cleans up in kitchen; 12/17/2018); 6-7/10 back spams pain at most for the past 7 days, usually when she is doing stuff in the kitchen (01/29/2019); 7/10 at worst for the past 7 days (03/25/2019); 7.5/10 (05/20/19); 8/10 low back pain at most for the past 7 days (06/10/2019); 7/10 at worst; Easier to decrease back pain since starting PT (07/22/2019). 4-5/10 at most for the past 7 days (09/16/2019); 7/10 at most then does  her stretches to help ease it off (11/18/2019)    Time 8    Period Weeks    Status On-going    Target Date 01/14/20      PT LONG TERM GOAL #2   Title Patient will have a decrease in L knee pain to 3/10 or less at worst to promote ability to ambulate, negotiate stairs, get into and out of a car more comfortably.     Baseline 6/10 L knee pain at most for the past 3 months (09/24/2017); 7-8/10 at most for the past 7 days, duration of pain is better since starting PT (10/29/2017); 7/10 at most for the past 7 days (11/18/2017); 6/10 L knee pain at most for the past 7 days (12/12/2017); 5/10 L knee pain at most for the past 7 days (pt states doing her exercises helps  decrease pain; 01/02/2018); L knee 3/10 (02/05/2018); 4/10 at most for the past 7 days (03/03/2018); 5.5/10 L knee at most for the past 7 days (03/27/2018); 6-7/10 L knee pain at most (05/08/2018); 7/10 L knee pain at worst for the past month (07/22/2018);  6/10 L knee pain at most for the past 7 days (09/22/2018); 5-6/10 at most for the past 7 days sporadically, can get it better with exercise (10/27/2018); 6/10 L knee ache at most for the past 7 days (12/17/2018); 5-6/10 L knee pain at most for the past 7 days such as going up and down steps (01/29/2019); 5/10 at most for the past 7 days (03/25/2019); 5.5/10 (05/20/19); 4/10 (06/10/2019); 4/10 (07/22/2019); 4/10 L knee pain at most for the past 7 days (09/16/2019); 7/10 at worst such as during stair negotiaton or walking on uneven surfaces. Frequency and duraction of worst pain level is less. (11/18/2019)    Time 8    Period Weeks    Status Partially Met    Target Date 01/14/20      PT LONG TERM GOAL #3   Title Patient will improve TUG time to 12 seconds or less as a demonstration of improved functional mobility and balance.     Baseline TUG no AD: 16.05 seconds on average (09/24/2017); 14.3 seconds on average (10/29/2017); 13 seconds average (11/21/2017); 16.67 seconds average (12/12/2017);  14.17 seconds average (01/02/2018); 11.8 sec sonds (02/05/2018)    Time 6    Period Weeks    Status Achieved      PT LONG TERM GOAL #4   Title Patient will improve her back FOTO score by at least 10 points as a demonstration of improved funtion.      Baseline Back FOTO: 33 (09/24/2017); 38 (10/29/2017); 36 (11/21/2017); 40 (12/12/2017); 42 (01/02/2018); 47 (03/03/2018)    Time 6    Period Weeks    Status Achieved      PT LONG TERM GOAL #5   Title Patient will improve bilateral LE strength by at least 1/2 MMT grade to promote ability to perform standing tasks.     Time 6    Period Weeks    Status Achieved      PT LONG TERM GOAL #6   Title Patient will improve her Modified  [place] Low Back pain disablity questionnaire by at least 10% as a demonstration of improved function.     Baseline 48% (09/24/2017); 46% (10/29/2017), (11/21/2017); 56% (12/12/2017); 36% (01/02/2018), (03/03/2018)    Time 6    Period Weeks    Status Achieved      PT LONG TERM GOAL #7   Title Pt will report  decreased difficulty stepping into and out of her bathtub as well as getting into and out of her truck to promote ability to get into and out of places.     Baseline Pt reports difficulty stepping into and out of her bathtub as well as difficulty getting into and out of her truck (01/02/2018); Pt states the truck and bath tub and difficult and painful.  (02/05/2018); Improving per subjective reports (03/03/2018); decreased difficulty with truck tranfser after cues for technique and practice, decreasing difficulty with stepping into and out of the bath tub based on subjective reports (03/27/2018 and 04/01/2018); Decreasing difficulty stepping into a bathtub. No difficulty getting into and out of her truck (05/08/2018), (07/22/2018); Difficulty stepping into and out of the bath tub. No difficulty with truck transfers (09/22/2018); Able to get into her bath tub, difficulty getting out (10/27/18); Still has difficulty getting into and out of bathtub (02/23/2019); Better able to step into the bathtub. Still has difficulty stepping out of the bathtub due to knee flexion (03/25/2019); No difficulty stepping into her bath tub. Still has difficulty stepping out due to decreased L knee flexion ROM (05/20/2019), (06/10/2019), (07/22/2019), (09/16/2019). Getting into and out of her bath tub to stand and take a shower is easier (11/18/2019)    Time 8    Period Weeks    Status Achieved      PT LONG TERM GOAL #8   Title Patient will improve her BERG balance test score to 46/56 or more as a demonstration of decreased fall risk     Baseline 42/56 (01/02/2018); 54/56 (02/05/2018)    Time 6    Period Weeks    Status Achieved      PT  LONG TERM GOAL  #9   TITLE Pt will report minimal to no dizziness related to vertigo to promote balance and decrease fall risk.     Baseline Pt reports dizziness (related to vertigo) which seem to affect her balance (01/15/2018); Patient reports "a little bit every now and then" (02/05/2018); Pt reports dizziness and decreased balance (01/29/2019); Dizziness has improved some (02/23/2019), (03/25/2019); Still has dizziness and vertigo; does the Epley for both sides instead of just the L side (06/10/2019), Dizziness has decreased. Does Epley for L side (09/16/2019), Pt still has dizziness but does her Epley for both sides. Pt was recommended to do Epley for L side only secondary to L side being positive and performing it to the R will undo her progress. (11/18/2019)    Time 8    Period Weeks    Status On-going    Target Date 01/13/20      PT LONG TERM GOAL  #10   TITLE Pt will be able to turn 360 degrees to the R and L at least 3 times without dizziness and no LOB to promote safety and decrease fall risk.    Baseline 360 degree turn to the R and L causes a loss of balance (12/17/2018), (01/29/2019). able to perform 3 times with each side with improved balance, minimal dizziness. (02/23/2019); Dizziness (05/20/19), slight dizziness (06/10/2019), (07/22/2019), (09/16/2019). Dizziness for R turn, no dizziness with L turn, no LOB (11/18/2019).    Time 8    Period Weeks    Status On-going    Target Date 01/14/20      PT LONG TERM GOAL  #11   TITLE Patient will have a decrease in R hip and knee pain to 4/10 or less at worst to promote ability to ambulate with less  discomfort.    Baseline 7/10 R hip and knee pain at worst for the past month (03/25/2019); 5/10 R hip pain, 6/10 R knee pain at most for the past 7 days (05/20/19); 5.5/10 R knee pain (05/20/19); 8/10 R hip; 6/10 R knee at most for the past 7 days (06/10/2019); 7/10, easier to decrease pain since starting PT (07/22/2019); 6/10  R hip, and 6/10 R knee at most  for the past 7 days (09/16/2019); 6/10 R hip and knee at most for the past 7 days secondary to being stiff. Moving more feels better.  (11/18/2019)    Time 8    Period Weeks    Status On-going    Target Date 01/14/20                 Plan - 12/02/19 1405    Clinical Impression Statement Decreased R knee pain during gait with movement as well as increasing ankle DF during heel strike. Added Nustep today for joint nutrition at her knee joint, cues for maintaining at least 50 steps per minute. Continued working on improving glute and medial hamstring strength as well as hip IR to promote better mechanics at her R knee joint. Decreased R knee pain during gait reported by pt after session. Pt will benefit from continued skilled physical therapy services to decrease pain, improve strength and function.    Personal Factors and Comorbidities Age;Comorbidity 3+;Time since onset of injury/illness/exacerbation;Fitness;Past/Current Experience    Comorbidities Back, knee pain, hx of dizziness, hx of back surgery, hx of C-section; memory lapse    Examination-Activity Limitations Squat    Stability/Clinical Decision Making Stable/Uncomplicated    Rehab Potential Fair    Clinical Impairments Affecting Rehab Potential difficulty with carry-over of improvements in clinic, chronicity of condition, age, multiple problem areas; MEMORY lapse    PT Frequency 1x / week    PT Duration 8 weeks    PT Treatment/Interventions Therapeutic activities;Therapeutic exercise;Balance training;Neuromuscular re-education;Patient/family education;Manual techniques;Dry needling;Aquatic Therapy;Electrical Stimulation;Iontophoresis 61m/ml Dexamethasone;Gait training;Canalith Repostioning;Vestibular    PT Next Visit Plan  hip and knee strengthening, core strengthening, patellar mobility, manual techniques, modalities PRN    PT Home Exercise Plan Medbridge Access Code: [email or web address]    Consulted and Agree with Plan of Care Patient            Patient will benefit from skilled therapeutic intervention in order to improve the following deficits and impairments:  Pain, Improper body mechanics, Postural dysfunction, Dizziness, Decreased strength, Difficulty walking, Decreased balance  Visit Diagnosis: Muscle weakness (generalized)  Right knee pain, unspecified chronicity  Radiculopathy, lumbar region     Problem List Patient Active Problem List   Diagnosis Date Noted  . Obese 06/20/2016  . Status post right knee replacement 06/19/2016  . S/P left TKA 06/18/2016  . Spinal stenosis of lumbar region 09/14/2015    MJoneen BoersPT, DPT   12/02/2019, 2:43 PM  [facility] [address] [place] [address]Phone: [phone]  Fax:  [phone] Name: RSHAREE STURDYMRN: [id number] of Birth: [birth year]

## 2019-12-09 ENCOUNTER — Ambulatory Visit: Payer: Medicare Other

## 2019-12-16 ENCOUNTER — Other Ambulatory Visit: Payer: Self-pay

## 2019-12-16 ENCOUNTER — Ambulatory Visit: Payer: Medicare Other

## 2019-12-16 DIAGNOSIS — M25551 Pain in right hip: Secondary | ICD-10-CM

## 2019-12-16 DIAGNOSIS — Z9181 History of falling: Secondary | ICD-10-CM

## 2019-12-16 DIAGNOSIS — M6281 Muscle weakness (generalized): Secondary | ICD-10-CM

## 2019-12-16 DIAGNOSIS — M25561 Pain in right knee: Secondary | ICD-10-CM

## 2019-12-16 DIAGNOSIS — M5416 Radiculopathy, lumbar region: Secondary | ICD-10-CM

## 2019-12-16 NOTE — Therapy (Signed)
[facility] [address], [place], [address] Phone: [phone]   Fax:  [phone]  Physical Therapy Treatment And Progress Report (09/28/2019 - 12/16/2019)  Patient Details  Name: Destiny Shaw MRN: [id number] Date of Birth: September 03, 1950 Referring Provider (PT): Paralee Cancel, MD   Encounter Date: 12/16/2019   PT End of Session - 12/16/19 1303    Visit Number 90    Number of Visits 118    Date for PT Re-Evaluation 01/14/20    Authorization Type 10    Authorization Time Period of 10 progress note Medicare    PT Start Time 1303    PT Stop Time 1342    PT Time Calculation (min) 39 min    Activity Tolerance Patient tolerated treatment well    Behavior During Therapy [place] for tasks assessed/performed           Past Medical History:  Diagnosis Date  . ADD (attention deficit disorder)   . Anemia   . Anginal pain ([place])    pt states has occas chest pain relates to indigestion; pt uses rest to relieve   . Anxiety   . Arthritis   . Concussion   . Depression   . Diabetes mellitus without complication ([place])   . Dizziness   . Fall   . GERD (gastroesophageal reflux disease)   . Headache   . History of urinary tract infection   . Hyperlipidemia   . Hypertension   . Hypothyroidism   . IBS (irritable bowel syndrome)   . Imbalance   . Numbness    right leg   . Numbness in both hands    comes and goes   . Pneumonia    last episode approx 1 year ago   . Sleep apnea   . Wears glasses     Past Surgical History:  Procedure Laterality Date  . BREAST CYST ASPIRATION Left 1980's   neg  . BREAST CYST EXCISION Right 1980's   neg  . BREAST LUMPECTOMY Right   . BREAST SURGERY    . CARPAL TUNNEL RELEASE    . CESAREAN SECTION     times 2  . COLONOSCOPY WITH PROPOFOL N/A 05/11/2015   Procedure: COLONOSCOPY WITH PROPOFOL;  Surgeon: Manya Silvas, MD;  Location: [facility] ENDOSCOPY;  Service: Endoscopy;   Laterality: N/A;  . DE QUERVAIN'S RELEASE Right   . ESOPHAGOGASTRODUODENOSCOPY (EGD) WITH PROPOFOL N/A 05/11/2015   Procedure: ESOPHAGOGASTRODUODENOSCOPY (EGD) WITH PROPOFOL;  Surgeon: Manya Silvas, MD;  Location: [facility] ENDOSCOPY;  Service: Endoscopy;  Laterality: N/A;  . EYE SURGERY     laser surgery bilat   . HERNIA REPAIR    . KNEE ARTHROSCOPY    . LUMBAR LAMINECTOMY/DECOMPRESSION MICRODISCECTOMY Bilateral 09/14/2015   Procedure: MICRO LUMBAR BILATERAL DECOMPRESSION L4 - L5;  Surgeon: Susa Day, MD;  Location: [facility] ORS;  Service: Orthopedics;  Laterality: Bilateral;  . REDUCTION MAMMAPLASTY Bilateral 1980  . RHINOPLASTY    . TONSILLECTOMY    . TOTAL KNEE ARTHROPLASTY Left 06/18/2016   Procedure: LEFT TOTAL KNEE ARTHROPLASTY;  Surgeon: Paralee Cancel, MD;  Location: [facility] ORS;  Service: Orthopedics;  Laterality: Left;  Adductor Block  . TUBAL LIGATION    . UVULOPALATOPHARYNGOPLASTY      There were no vitals filed for this visit.   Subjective Assessment - 12/16/19 1305    Subjective Has an appointment with her cardiologist at 1:30 pm or 2 pm at [facility]. 5/10 back pain, 5/10  L knee, 7/10 R knee, 0/10 R hip pain currently. Had a hard time wiht her R knee last week, and saw Dr. Alvan Dame who wants to do another knee replacement. Does not want another surgery. Got a cotisone shot which hardly worked.    Patient is accompained by: Family member    Pertinent History Low back pain. Pt states having back surgery about 2-3 years ago. After she had her L TKA, her back started aggravating her due to her walking. Pt also fell about 4 weeks ago onto her L knee. Dr. Alvan Dame checked out her L knee. L knee was fine but has some inflammation.  Pt tries to keep it iced. Pt still recovering for her L TKA but the fall set her back.   Her L knee surgery was last year.  L knee still bothers her a lot.  Had surgery for her back before her L knee surgery. Was doing well until her L knee surgery. The fall made it  worse. Feels debilitating. Also has a hard time getting into and out of the car mainly due to her L knee.   Pt fell due to her L knee bucking on her. Pt was trying to pick something up from the side of the couch.  Also has a hard time getting up from the floor.  Pt states having bladder accidents since after her fall (pt was recommended to tell her MD about it).  Bowel problems since taking medications (was constapated, but after taking medications, pt had diarrhea which was difficult to control. Getting out of the metformin fixed the bowel issue).  Pt states tingling and numbness L lateral LE along the L5 dermatome.  Denies saddle anesthesia.  Pt states that her back problem is mainly on her L side.  Pt landed on her L knee when she fell.  No other falls within the last 6 months. Pt also states being very dizzy since her L knee surgery.  Had PT treatement for her dizziness before which did not help.  The room feels like it is spinning when she gets dizzy.  I feel like it is getting better then she does something.  5/10 low back soreness currently (7/10 at most for the past 7 days after negotiating stairs, 5/10 without doing stairs). 4/10 L knee pain currenglty with gait 5/10 L knee pain at most for the past 7 days.    Patient Stated Goals Be better able to get into and out of her vehicle (midsized truck), into and out of chairs, be better able to roll in her bed with less L knee pain. Be able to get down on the floor and get back up.     Currently in Pain? Yes    Pain Score 7                                      PT Education - 12/16/19 1346    Education Details ther-ex, arch supports    Person(s) Educated Patient    Methods Explanation;Demonstration;Tactile cues;Verbal cues    Comprehension Returned demonstration;Verbalized understanding          Objectives   MedbridgeAccess Code: [email or web address]  Latex free bands usedif used   Sitting posture: L trunk  rotation  Standing posture: slightLlateral shift   R knee exercises: Seated R hip extension isometrics, seated R hip ER, seated hip adduction isometrics, hip IR, R piriformis  stretch   Pt observed to ambulate with SPC on R side.   Therapeutic exercise  Reviewed progress/current status with PT towards goals   Gait with R arch supported. No R knee pain during gait.   Seated R hip extension isometrics PT manual resistance 10x5 seconds for 2 sets  Sit <> stand 5x from regular chair with amrs, emphasis on weight shifting  SLS with B UE assist   R 4x5 seconds   L 4x5 seconds (R calf pain with standing R hip flexion)  Seated manually resisted L lateral shift isometrics at netural to counter R lateral shift posture  10x5 seconds for 3 sets  Seated manually resisted trunk flexion isometrics in neutral 10x5 seconds   Seated manually resisted trunk extension isometrics in neutral 10x5 seconds   Standing hip abduction with B UE assist   R 7x5 seconds. R lateral LE discomfort, eases with rest.   L 7x5 seconds      Improved exercise technique, movement at target joints, use of target muscles after mod verbal, visual, tactile cues.    Response to treatment Decreased R knee pain during gait after session.    Clinical impression Pt demonstrates overall decreased low back, L knee and R hip pain. Pt has been having more difficulty with R knee pain recently. No R knee pain during gait with addition of arch supports. Continued working on improving posture, trunk and glute strength to promote better mechanics overall with movement. Pt will benefit from continued skilled physical therapy services to decrease pain, improve strength, and function, and prevent regression. Challenges to progress include multiple areas of pain, chronicity of condition, age, and memory.    PT Short Term Goals - 11/18/19 1313      PT SHORT TERM GOAL #1   Title Patient will be independent with her  HEP to help decrease back and L knee pain and improve ability to perform functional tasks.     Time 3    Period Weeks    Status Achieved    Target Date 07/02/19             PT Long Term Goals - 12/16/19 1308      PT LONG TERM GOAL #1   Title Patient will have a decrease in back pain to 3/10 or less at worst to promote ability to ambulate, turn in bed, perform standing tasks with less pain.     Baseline 6/10 back pain at most (09/24/2017); 7-8/10 at most for the past 7 days, duration of pain is better since starting PT (10/29/2017); 5-6/10 at most for the past 7 days (11/18/2017); 8/10 at most for the past 7 days but the duration of pain is less compared to prior to starting PT (12/12/2017).  7/10 back pain at most for the past 7 days (pt states doing her exercises helps decrease her pain; 01/02/2018); walking (3/10), standing (4/10), bed mobility (0/10) 02/05/2018; 6/10 back pain at most for the past 7 days (03/03/2018); 6.5/10 back pain at most for the past 7 days (03/27/2018); 7-8/10 at most (05/08/2018); 7/10 back pain at worst (07/22/2018);  7-8/10 at most for the past 7 days (09/22/2018); 5-7/10 at worst for the past 7 days then calms down (10/27/2018); 6/10 back pain at most for the past 7 days (when pt cooks and cleans up in kitchen; 12/17/2018); 6-7/10 back spams pain at most for the past 7 days, usually when she is doing stuff in the kitchen (01/29/2019); 7/10 at worst for the  past 7 days (03/25/2019); 7.5/10 (05/20/19); 8/10 low back pain at most for the past 7 days (06/10/2019); 7/10 at worst; Easier to decrease back pain since starting PT (07/22/2019). 4-5/10 at most for the past 7 days (09/16/2019); 7/10 at most then does her stretches to help ease it off (11/18/2019); 5/10 back pain at most for the past 7 days (12/16/2019)    Time 8    Period Weeks    Status On-going    Target Date 01/14/20      PT LONG TERM GOAL #2   Title Patient will have a decrease in L knee pain to 3/10 or less at worst to  promote ability to ambulate, negotiate stairs, get into and out of a car more comfortably.     Baseline 6/10 L knee pain at most for the past 3 months (09/24/2017); 7-8/10 at most for the past 7 days, duration of pain is better since starting PT (10/29/2017); 7/10 at most for the past 7 days (11/18/2017); 6/10 L knee pain at most for the past 7 days (12/12/2017); 5/10 L knee pain at most for the past 7 days (pt states doing her exercises helps decrease pain; 01/02/2018); L knee 3/10 (02/05/2018); 4/10 at most for the past 7 days (03/03/2018); 5.5/10 L knee at most for the past 7 days (03/27/2018); 6-7/10 L knee pain at most (05/08/2018); 7/10 L knee pain at worst for the past month (07/22/2018);  6/10 L knee pain at most for the past 7 days (09/22/2018); 5-6/10 at most for the past 7 days sporadically, can get it better with exercise (10/27/2018); 6/10 L knee ache at most for the past 7 days (12/17/2018); 5-6/10 L knee pain at most for the past 7 days such as going up and down steps (01/29/2019); 5/10 at most for the past 7 days (03/25/2019); 5.5/10 (05/20/19); 4/10 (06/10/2019); 4/10 (07/22/2019); 4/10 L knee pain at most for the past 7 days (09/16/2019); 7/10 at worst such as during stair negotiaton or walking on uneven surfaces. Frequency and duraction of worst pain level is less. (11/18/2019); 4/10 at most for the past 7 days (12/16/2019)    Time 8    Period Weeks    Status Partially Met    Target Date 01/14/20      PT LONG TERM GOAL #3   Title Patient will improve TUG time to 12 seconds or less as a demonstration of improved functional mobility and balance.     Baseline TUG no AD: 16.05 seconds on average (09/24/2017); 14.3 seconds on average (10/29/2017); 13 seconds average (11/21/2017); 16.67 seconds average (12/12/2017);  14.17 seconds average (01/02/2018); 11.8 sec sonds (02/05/2018)    Time 6    Period Weeks    Status Achieved      PT LONG TERM GOAL #4   Title Patient will improve her back FOTO score by at least 10  points as a demonstration of improved funtion.      Baseline Back FOTO: 33 (09/24/2017); 38 (10/29/2017); 36 (11/21/2017); 40 (12/12/2017); 42 (01/02/2018); 47 (03/03/2018)    Time 6    Period Weeks    Status Achieved      PT LONG TERM GOAL #5   Title Patient will improve bilateral LE strength by at least 1/2 MMT grade to promote ability to perform standing tasks.     Time 6    Period Weeks    Status Achieved      PT LONG TERM GOAL #6   Title Patient will improve  her Modified Oswestry Low Back pain disablity questionnaire by at least 10% as a demonstration of improved function.     Baseline 48% (09/24/2017); 46% (10/29/2017), (11/21/2017); 56% (12/12/2017); 36% (01/02/2018), (03/03/2018)    Time 6    Period Weeks    Status Achieved      PT LONG TERM GOAL #7   Title Pt will report decreased difficulty stepping into and out of her bathtub as well as getting into and out of her truck to promote ability to get into and out of places.     Baseline Pt reports difficulty stepping into and out of her bathtub as well as difficulty getting into and out of her truck (01/02/2018); Pt states the truck and bath tub and difficult and painful.  (02/05/2018); Improving per subjective reports (03/03/2018); decreased difficulty with truck tranfser after cues for technique and practice, decreasing difficulty with stepping into and out of the bath tub based on subjective reports (03/27/2018 and 04/01/2018); Decreasing difficulty stepping into a bathtub. No difficulty getting into and out of her truck (05/08/2018), (07/22/2018); Difficulty stepping into and out of the bath tub. No difficulty with truck transfers (09/22/2018); Able to get into her bath tub, difficulty getting out (10/27/18); Still has difficulty getting into and out of bathtub (02/23/2019); Better able to step into the bathtub. Still has difficulty stepping out of the bathtub due to knee flexion (03/25/2019); No difficulty stepping into her bath tub. Still has difficulty  stepping out due to decreased L knee flexion ROM (05/20/2019), (06/10/2019), (07/22/2019), (09/16/2019). Getting into and out of her bath tub to stand and take a shower is easier (11/18/2019)    Time 8    Period Weeks    Status Achieved      PT LONG TERM GOAL #8   Title Patient will improve her BERG balance test score to 46/56 or more as a demonstration of decreased fall risk     Baseline 42/56 (01/02/2018); 54/56 (02/05/2018)    Time 6    Period Weeks    Status Achieved      PT LONG TERM GOAL  #9   TITLE Pt will report minimal to no dizziness related to vertigo to promote balance and decrease fall risk.     Baseline Pt reports dizziness (related to vertigo) which seem to affect her balance (01/15/2018); Patient reports "a little bit every now and then" (02/05/2018); Pt reports dizziness and decreased balance (01/29/2019); Dizziness has improved some (02/23/2019), (03/25/2019); Still has dizziness and vertigo; does the Epley for both sides instead of just the L side (06/10/2019), Dizziness has decreased. Does Epley for L side (09/16/2019), Pt still has dizziness but does her Epley for both sides. Pt was recommended to do Epley for L side only secondary to L side being positive and performing it to the R will undo her progress. (11/18/2019);  Dizziness is getting better (12/16/2019)    Time 8    Period Weeks    Status On-going    Target Date 01/14/20      PT LONG TERM GOAL  #10   TITLE Pt will be able to turn 360 degrees to the R and L at least 3 times without dizziness and no LOB to promote safety and decrease fall risk.    Baseline 360 degree turn to the R and L causes a loss of balance (12/17/2018), (01/29/2019). able to perform 3 times with each side with improved balance, minimal dizziness. (02/23/2019); Dizziness (05/20/19), slight dizziness (06/10/2019), (07/22/2019), (09/16/2019). Dizziness  for R turn, no dizziness with L turn, no LOB (11/18/2019).    Time 8    Period Weeks    Status On-going     Target Date 01/14/20      PT LONG TERM GOAL  #11   TITLE Patient will have a decrease in R hip and knee pain to 4/10 or less at worst to promote ability to ambulate with less discomfort.    Baseline 7/10 R hip and knee pain at worst for the past month (03/25/2019); 5/10 R hip pain, 6/10 R knee pain at most for the past 7 days (05/20/19); 5.5/10 R knee pain (05/20/19); 8/10 R hip; 6/10 R knee at most for the past 7 days (06/10/2019); 7/10, easier to decrease pain since starting PT (07/22/2019); 6/10  R hip, and 6/10 R knee at most for the past 7 days (09/16/2019); 6/10 R hip and knee at most for the past 7 days secondary to being stiff. Moving more feels better.  (11/18/2019); 5/10 R hip, 8/10 R knee at most for the past 7 days (12/16/2019)    Time 8    Period Weeks    Status On-going    Target Date 01/14/20                 Plan - 12/16/19 1345    Clinical Impression Statement Pt demonstrates overall decreased low back, L knee and R hip pain. Pt has been having more difficulty with R knee pain recently. No R knee pain during gait with addition of arch supports. Continued working on improving posture, trunk and glute strength to promote better mechanics overall with movement. Pt will benefit from continued skilled physical therapy services to decrease pain, improve strength, and function, and prevent regression. Challenges to progress include multiple areas of pain, chronicity of condition, age, and memory.    Personal Factors and Comorbidities Age;Comorbidity 3+;Time since onset of injury/illness/exacerbation;Fitness;Past/Current Experience    Comorbidities Back, knee pain, hx of dizziness, hx of back surgery, hx of C-section; memory lapse    Examination-Activity Limitations Squat    Stability/Clinical Decision Making Stable/Uncomplicated    Rehab Potential Fair    Clinical Impairments Affecting Rehab Potential difficulty with carry-over of improvements in clinic, chronicity of condition, age,  multiple problem areas; MEMORY lapse    PT Frequency 1x / week    PT Duration 8 weeks    PT Treatment/Interventions Therapeutic activities;Therapeutic exercise;Balance training;Neuromuscular re-education;Patient/family education;Manual techniques;Dry needling;Aquatic Therapy;Electrical Stimulation;Iontophoresis 10m/ml Dexamethasone;Gait training;Canalith Repostioning;Vestibular    PT Next Visit Plan  hip and knee strengthening, core strengthening, patellar mobility, manual techniques, modalities PRN    PT Home Exercise Plan Medbridge Access Code: [email or web address]    Consulted and Agree with Plan of Care Patient           Patient will benefit from skilled therapeutic intervention in order to improve the following deficits and impairments:  Pain, Improper body mechanics, Postural dysfunction, Dizziness, Decreased strength, Difficulty walking, Decreased balance  Visit Diagnosis: Muscle weakness (generalized)  Right knee pain, unspecified chronicity  Radiculopathy, lumbar region  Pain in right hip  History of falling     Problem List Patient Active Problem List   Diagnosis Date Noted  . Obese 06/20/2016  . Status post right knee replacement 06/19/2016  . S/P left TKA 06/18/2016  . Spinal stenosis of lumbar region 09/14/2015    Thank you for your referral.  MJoneen BoersPT, DPT   12/16/2019, 3:56 PM  [facility] [address], [place], [address] Phone: [phone]   Fax:  [phone]  Name: Destiny Shaw MRN: [id number] Date of Birth: 02-Jun-1950

## 2019-12-23 ENCOUNTER — Ambulatory Visit: Payer: Medicare Other

## 2019-12-23 ENCOUNTER — Other Ambulatory Visit: Payer: Self-pay

## 2019-12-23 DIAGNOSIS — M6281 Muscle weakness (generalized): Secondary | ICD-10-CM | POA: Diagnosis not present

## 2019-12-23 DIAGNOSIS — Z9181 History of falling: Secondary | ICD-10-CM

## 2019-12-23 DIAGNOSIS — M25561 Pain in right knee: Secondary | ICD-10-CM

## 2019-12-23 DIAGNOSIS — M5416 Radiculopathy, lumbar region: Secondary | ICD-10-CM

## 2019-12-23 DIAGNOSIS — M25551 Pain in right hip: Secondary | ICD-10-CM

## 2019-12-23 NOTE — Therapy (Signed)
[facility] [address] [place], [address] Phone: [phone]   Fax:  [phone]  Physical Therapy Treatment  Patient Details  Name: Destiny Shaw MRN: [id number] Date of Birth: 1950-04-13 Referring Provider (PT): Paralee Cancel, MD   Encounter Date: 12/23/2019   PT End of Session - 12/23/19 1302    Visit Number 91    Number of Visits 118    Date for PT Re-Evaluation 01/14/20    Authorization Type 1    Authorization Time Period of 10 progress note Medicare    PT Start Time 1303    PT Stop Time 1347    PT Time Calculation (min) 44 min    Activity Tolerance Patient tolerated treatment well    Behavior During Therapy [facility] for tasks assessed/performed           Past Medical History:  Diagnosis Date  . ADD (attention deficit disorder)   . Anemia   . Anginal pain ([place])    pt states has occas chest pain relates to indigestion; pt uses rest to relieve   . Anxiety   . Arthritis   . Concussion   . Depression   . Diabetes mellitus without complication ([place])   . Dizziness   . Fall   . GERD (gastroesophageal reflux disease)   . Headache   . History of urinary tract infection   . Hyperlipidemia   . Hypertension   . Hypothyroidism   . IBS (irritable bowel syndrome)   . Imbalance   . Numbness    right leg   . Numbness in both hands    comes and goes   . Pneumonia    last episode approx 1 year ago   . Sleep apnea   . Wears glasses     Past Surgical History:  Procedure Laterality Date  . BREAST CYST ASPIRATION Left 1980's   neg  . BREAST CYST EXCISION Right 1980's   neg  . BREAST LUMPECTOMY Right   . BREAST SURGERY    . CARPAL TUNNEL RELEASE    . CESAREAN SECTION     times 2  . COLONOSCOPY WITH PROPOFOL N/A 05/11/2015   Procedure: COLONOSCOPY WITH PROPOFOL;  Surgeon: Manya Silvas, MD;  Location: [facility] ENDOSCOPY;  Service: Endoscopy;  Laterality: N/A;  . DE QUERVAIN'S RELEASE Right   .  ESOPHAGOGASTRODUODENOSCOPY (EGD) WITH PROPOFOL N/A 05/11/2015   Procedure: ESOPHAGOGASTRODUODENOSCOPY (EGD) WITH PROPOFOL;  Surgeon: Manya Silvas, MD;  Location[facility]COPY;  Service: Endoscopy;  Laterality: N/A;  . EYE SURGERY     laser surgery bilat   . HERNIA REPAIR    . KNEE ARTHROSCOPY    . LUMBAR LAMINECTOMY/DECOMPRESSION MICRODISCECTOMY Bilateral 09/14/2015   Procedure: MICRO LUMBAR BILATERAL DECOMPRESSION L4 - L5;  Surgeon: Susa Day, MD;  Location: [facility] ORS;  Service: Orthopedics;  Laterality: Bilateral;  . REDUCTION MAMMAPLASTY Bilateral 1980  . RHINOPLASTY    . TONSILLECTOMY    . TOTAL KNEE ARTHROPLASTY Left 06/18/2016   Procedure: LEFT TOTAL KNEE ARTHROPLASTY;  Surgeon: Paralee Cancel, MD;  Location: [facility] ORS;  Service: Orthopedics;  Laterality: Left;  Adductor Block  . TUBAL LIGATION    . UVULOPALATOPHARYNGOPLASTY      There were no vitals filed for this visit.   Subjective Assessment - 12/23/19 1304    Subjective Pt states that Dr. Alvan Dame told her that her R knee does not have cartilage. 6/10 R knee during gait.  L knee is doing  fine. Low back bothers her, but not as bad as it usually does (usually 7/10 but currently 4-5/10)    Patient is accompained by: Family member    Pertinent History Low back pain. Pt states having back surgery about 2-3 years ago. After she had her L TKA, her back started aggravating her due to her walking. Pt also fell about 4 weeks ago onto her L knee. Dr. Alvan Dame checked out her L knee. L knee was fine but has some inflammation.  Pt tries to keep it iced. Pt still recovering for her L TKA but the fall set her back.   Her L knee surgery was last year.  L knee still bothers her a lot.  Had surgery for her back before her L knee surgery. Was doing well until her L knee surgery. The fall made it worse. Feels debilitating. Also has a hard time getting into and out of the car mainly due to her L knee.   Pt fell due to her L knee bucking on her. Pt was trying to  pick something up from the side of the couch.  Also has a hard time getting up from the floor.  Pt states having bladder accidents since after her fall (pt was recommended to tell her MD about it).  Bowel problems since taking medications (was constapated, but after taking medications, pt had diarrhea which was difficult to control. Getting out of the metformin fixed the bowel issue).  Pt states tingling and numbness L lateral LE along the L5 dermatome.  Denies saddle anesthesia.  Pt states that her back problem is mainly on her L side.  Pt landed on her L knee when she fell.  No other falls within the last 6 months. Pt also states being very dizzy since her L knee surgery.  Had PT treatement for her dizziness before which did not help.  The room feels like it is spinning when she gets dizzy.  I feel like it is getting better then she does something.  5/10 low back soreness currently (7/10 at most for the past 7 days after negotiating stairs, 5/10 without doing stairs). 4/10 L knee pain currenglty with gait 5/10 L knee pain at most for the past 7 days.    Patient Stated Goals Be better able to get into and out of her vehicle (midsized truck), into and out of chairs, be better able to roll in her bed with less L knee pain. Be able to get down on the floor and get back up.     Currently in Pain? Yes    Pain Score 6                                      PT Education - 12/23/19 1323    Education Details ther-ex    Person(s) Educated Patient    Methods Explanation;Demonstration;Tactile cues;Verbal cues    Comprehension Returned demonstration;Verbalized understanding           Objectives   MedbridgeAccess Code: [email or web address]  Latex free bands usedif used   Sitting posture: L trunk rotation  Standing posture: slightLlateral shift   R knee exercises: Seated R hip extension isometrics, seated R hip ER, seated hip adduction isometrics, hip IR, R piriformis  stretch   Pt observed to ambulate with SPC on R side.  Therapeutic exercise   Reviewed pt about orthotics for her foot to support  arch to help improve mechanics and decrease pain in knee.  Gait with R arch supported again. No R knee pain during gait.   Side stepping 30 ft to the R and 30 ft to the L. Good glute med muscle use felt.   Pt education to make sure pt is in front of her chair prior to sitting so as to not twist her knee and cause knee pain. Pt verbalized understanding. Further cues needed for reinforcement  Seated hip adduction ball and glute max squeeze 10x5 seconds for 2 sets   Sitting posture: L lumbar rotation   Seated manually resisted L upper trunk rotation isometrics to promote R lumbar rotation to promote neutral posture for 10x5 seconds 3 sets. Pt states core feeling better.   Seated B scapular retraction 10x3 with 5 seconds to promote thoracic extension to decrease stress to low back   Seated manually resisted L lateral shift isometrics at netural to counter R lateral shift posture             10x5 seconds for 3 sets   Provided pt with small notebook to for pt to write down PT related exercises at recommendations to keep near her to help remind her what to do. Pt was observed writing notes on paper previously.               Improved exercise technique, movement at target joints, use of target muscles after mod verbal, visual, tactile cues.    Response to treatment Decreased R knee pain during gait after session.   Clinical impression No R knee pain during gait with R arch supported. Pt education on getting and using arch supports with standing activities to decrease R knee pain. Pt verbalized understanding. Decreased back pain with treatment to activate trunk muscles and improve posture. Pt will benefit from continued skilled physical therapy services to decrease pain, improve strength and function. Challenges to progress include chronicity  of condition and memory.       PT Short Term Goals - 11/18/19 1313      PT SHORT TERM GOAL #1   Title Patient will be independent with her HEP to help decrease back and L knee pain and improve ability to perform functional tasks.     Time 3    Period Weeks    Status Achieved    Target Date 07/02/19             PT Long Term Goals - 12/16/19 1308      PT LONG TERM GOAL #1   Title Patient will have a decrease in back pain to 3/10 or less at worst to promote ability to ambulate, turn in bed, perform standing tasks with less pain.     Baseline 6/10 back pain at most (09/24/2017); 7-8/10 at most for the past 7 days, duration of pain is better since starting PT (10/29/2017); 5-6/10 at most for the past 7 days (11/18/2017); 8/10 at most for the past 7 days but the duration of pain is less compared to prior to starting PT (12/12/2017).  7/10 back pain at most for the past 7 days (pt states doing her exercises helps decrease her pain; 01/02/2018); walking (3/10), standing (4/10), bed mobility (0/10) 02/05/2018; 6/10 back pain at most for the past 7 days (03/03/2018); 6.5/10 back pain at most for the past 7 days (03/27/2018); 7-8/10 at most (05/08/2018); 7/10 back pain at worst (07/22/2018);  7-8/10 at most for the past 7 days (09/22/2018); 5-7/10 at worst  for the past 7 days then calms down (10/27/2018); 6/10 back pain at most for the past 7 days (when pt cooks and cleans up in kitchen; 12/17/2018); 6-7/10 back spams pain at most for the past 7 days, usually when she is doing stuff in the kitchen (01/29/2019); 7/10 at worst for the past 7 days (03/25/2019); 7.5/10 (05/20/19); 8/10 low back pain at most for the past 7 days (06/10/2019); 7/10 at worst; Easier to decrease back pain since starting PT (07/22/2019). 4-5/10 at most for the past 7 days (09/16/2019); 7/10 at most then does her stretches to help ease it off (11/18/2019); 5/10 back pain at most for the past 7 days (12/16/2019)    Time 8    Period Weeks    Status  On-going    Target Date 01/14/20      PT LONG TERM GOAL #2   Title Patient will have a decrease in L knee pain to 3/10 or less at worst to promote ability to ambulate, negotiate stairs, get into and out of a car more comfortably.     Baseline 6/10 L knee pain at most for the past 3 months (09/24/2017); 7-8/10 at most for the past 7 days, duration of pain is better since starting PT (10/29/2017); 7/10 at most for the past 7 days (11/18/2017); 6/10 L knee pain at most for the past 7 days (12/12/2017); 5/10 L knee pain at most for the past 7 days (pt states doing her exercises helps decrease pain; 01/02/2018); L knee 3/10 (02/05/2018); 4/10 at most for the past 7 days (03/03/2018); 5.5/10 L knee at most for the past 7 days (03/27/2018); 6-7/10 L knee pain at most (05/08/2018); 7/10 L knee pain at worst for the past month (07/22/2018);  6/10 L knee pain at most for the past 7 days (09/22/2018); 5-6/10 at most for the past 7 days sporadically, can get it better with exercise (10/27/2018); 6/10 L knee ache at most for the past 7 days (12/17/2018); 5-6/10 L knee pain at most for the past 7 days such as going up and down steps (01/29/2019); 5/10 at most for the past 7 days (03/25/2019); 5.5/10 (05/20/19); 4/10 (06/10/2019); 4/10 (07/22/2019); 4/10 L knee pain at most for the past 7 days (09/16/2019); 7/10 at worst such as during stair negotiaton or walking on uneven surfaces. Frequency and duraction of worst pain level is less. (11/18/2019); 4/10 at most for the past 7 days (12/16/2019)    Time 8    Period Weeks    Status Partially Met    Target Date 01/14/20      PT LONG TERM GOAL #3   Title Patient will improve TUG time to 12 seconds or less as a demonstration of improved functional mobility and balance.     Baseline TUG no AD: 16.05 seconds on average (09/24/2017); 14.3 seconds on average (10/29/2017); 13 seconds average (11/21/2017); 16.67 seconds average (12/12/2017);  14.17 seconds average (01/02/2018); 11.8 sec sonds  (02/05/2018)    Time 6    Period Weeks    Status Achieved      PT LONG TERM GOAL #4   Title Patient will improve her back FOTO score by at least 10 points as a demonstration of improved funtion.      Baseline Back FOTO: 33 (09/24/2017); 38 (10/29/2017); 36 (11/21/2017); 40 (12/12/2017); 42 (01/02/2018); 47 (03/03/2018)    Time 6    Period Weeks    Status Achieved      PT LONG TERM GOAL #5  Title Patient will improve bilateral LE strength by at least 1/2 MMT grade to promote ability to perform standing tasks.     Time 6    Period Weeks    Status Achieved      PT LONG TERM GOAL #6   Title Patient will improve her Modified Oswestry Low Back pain disablity questionnaire by at least 10% as a demonstration of improved function.     Baseline 48% (09/24/2017); 46% (10/29/2017), (11/21/2017); 56% (12/12/2017); 36% (01/02/2018), (03/03/2018)    Time 6    Period Weeks    Status Achieved      PT LONG TERM GOAL #7   Title Pt will report decreased difficulty stepping into and out of her bathtub as well as getting into and out of her truck to promote ability to get into and out of places.     Baseline Pt reports difficulty stepping into and out of her bathtub as well as difficulty getting into and out of her truck (01/02/2018); Pt states the truck and bath tub and difficult and painful.  (02/05/2018); Improving per subjective reports (03/03/2018); decreased difficulty with truck tranfser after cues for technique and practice, decreasing difficulty with stepping into and out of the bath tub based on subjective reports (03/27/2018 and 04/01/2018); Decreasing difficulty stepping into a bathtub. No difficulty getting into and out of her truck (05/08/2018), (07/22/2018); Difficulty stepping into and out of the bath tub. No difficulty with truck transfers (09/22/2018); Able to get into her bath tub, difficulty getting out (10/27/18); Still has difficulty getting into and out of bathtub (02/23/2019); Better able to step into the  bathtub. Still has difficulty stepping out of the bathtub due to knee flexion (03/25/2019); No difficulty stepping into her bath tub. Still has difficulty stepping out due to decreased L knee flexion ROM (05/20/2019), (06/10/2019), (07/22/2019), (09/16/2019). Getting into and out of her bath tub to stand and take a shower is easier (11/18/2019)    Time 8    Period Weeks    Status Achieved      PT LONG TERM GOAL #8   Title Patient will improve her BERG balance test score to 46/56 or more as a demonstration of decreased fall risk     Baseline 42/56 (01/02/2018); 54/56 (02/05/2018)    Time 6    Period Weeks    Status Achieved      PT LONG TERM GOAL  #9   TITLE Pt will report minimal to no dizziness related to vertigo to promote balance and decrease fall risk.     Baseline Pt reports dizziness (related to vertigo) which seem to affect her balance (01/15/2018); Patient reports "a little bit every now and then" (02/05/2018); Pt reports dizziness and decreased balance (01/29/2019); Dizziness has improved some (02/23/2019), (03/25/2019); Still has dizziness and vertigo; does the Epley for both sides instead of just the L side (06/10/2019), Dizziness has decreased. Does Epley for L side (09/16/2019), Pt still has dizziness but does her Epley for both sides. Pt was recommended to do Epley for L side only secondary to L side being positive and performing it to the R will undo her progress. (11/18/2019);  Dizziness is getting better (12/16/2019)    Time 8    Period Weeks    Status On-going    Target Date 01/14/20      PT LONG TERM GOAL  #10   TITLE Pt will be able to turn 360 degrees to the R and L at least 3 times without  dizziness and no LOB to promote safety and decrease fall risk.    Baseline 360 degree turn to the R and L causes a loss of balance (12/17/2018), (01/29/2019). able to perform 3 times with each side with improved balance, minimal dizziness. (02/23/2019); Dizziness (05/20/19), slight dizziness  (06/10/2019), (07/22/2019), (09/16/2019). Dizziness for R turn, no dizziness with L turn, no LOB (11/18/2019).    Time 8    Period Weeks    Status On-going    Target Date 01/14/20      PT LONG TERM GOAL  #11   TITLE Patient will have a decrease in R hip and knee pain to 4/10 or less at worst to promote ability to ambulate with less discomfort.    Baseline 7/10 R hip and knee pain at worst for the past month (03/25/2019); 5/10 R hip pain, 6/10 R knee pain at most for the past 7 days (05/20/19); 5.5/10 R knee pain (05/20/19); 8/10 R hip; 6/10 R knee at most for the past 7 days (06/10/2019); 7/10, easier to decrease pain since starting PT (07/22/2019); 6/10  R hip, and 6/10 R knee at most for the past 7 days (09/16/2019); 6/10 R hip and knee at most for the past 7 days secondary to being stiff. Moving more feels better.  (11/18/2019); 5/10 R hip, 8/10 R knee at most for the past 7 days (12/16/2019)    Time 8    Period Weeks    Status On-going    Target Date 01/14/20                 Plan - 12/23/19 1302    Clinical Impression Statement No R knee pain during gait with R arch supported. Pt education on getting and using arch supports with standing activities to decrease R knee pain. Pt verbalized understanding. Decreased back pain with treatment to activate trunk muscles and improve posture. Pt will benefit from continued skilled physical therapy services to decrease pain, improve strength and function. Challenges to progress include chronicity of condition and memory.    Personal Factors and Comorbidities Age;Comorbidity 3+;Time since onset of injury/illness/exacerbation;Fitness;Past/Current Experience    Comorbidities Back, knee pain, hx of dizziness, hx of back surgery, hx of C-section; memory lapse    Examination-Activity Limitations Squat    Stability/Clinical Decision Making Stable/Uncomplicated    Rehab Potential Fair    Clinical Impairments Affecting Rehab Potential difficulty with carry-over of  improvements in clinic, chronicity of condition, age, multiple problem areas; MEMORY lapse    PT Frequency 1x / week    PT Duration 8 weeks    PT Treatment/Interventions Therapeutic activities;Therapeutic exercise;Balance training;Neuromuscular re-education;Patient/family education;Manual techniques;Dry needling;Aquatic Therapy;Electrical Stimulation;Iontophoresis 90m/ml Dexamethasone;Gait training;Canalith Repostioning;Vestibular    PT Next Visit Plan  hip and knee strengthening, core strengthening, patellar mobility, manual techniques, modalities PRN    PT Home Exercise Plan Medbridge Access Code: [email or web address]    Consulted and Agree with Plan of Care Patient           Patient will benefit from skilled therapeutic intervention in order to improve the following deficits and impairments:  Pain, Improper body mechanics, Postural dysfunction, Dizziness, Decreased strength, Difficulty walking, Decreased balance  Visit Diagnosis: Muscle weakness (generalized)  Right knee pain, unspecified chronicity  Radiculopathy, lumbar region  Pain in right hip  History of falling     Problem List Patient Active Problem List   Diagnosis Date Noted  . Obese 06/20/2016  . Status post right knee replacement 06/19/2016  . S/P left TKA  06/18/2016  . Spinal stenosis of lumbar region 09/14/2015    Joneen Boers PT, DPT   12/23/2019, 2:49 PM  [place] [facility] [address], [place], [address] Phone: [phone]   Fax:  [phone]  Name: Destiny Shaw MRN: [id number] Date of Birth: 1950-09-25

## 2019-12-30 ENCOUNTER — Ambulatory Visit: Payer: Medicare Other

## 2020-01-06 ENCOUNTER — Ambulatory Visit: Payer: Medicare Other | Attending: Orthopedic Surgery

## 2020-01-06 ENCOUNTER — Other Ambulatory Visit: Payer: Self-pay

## 2020-01-06 DIAGNOSIS — G8929 Other chronic pain: Secondary | ICD-10-CM | POA: Insufficient documentation

## 2020-01-06 DIAGNOSIS — M5416 Radiculopathy, lumbar region: Secondary | ICD-10-CM | POA: Insufficient documentation

## 2020-01-06 DIAGNOSIS — M25561 Pain in right knee: Secondary | ICD-10-CM | POA: Insufficient documentation

## 2020-01-06 DIAGNOSIS — M25551 Pain in right hip: Secondary | ICD-10-CM | POA: Insufficient documentation

## 2020-01-06 DIAGNOSIS — Z9181 History of falling: Secondary | ICD-10-CM | POA: Insufficient documentation

## 2020-01-06 DIAGNOSIS — M6281 Muscle weakness (generalized): Secondary | ICD-10-CM | POA: Diagnosis present

## 2020-01-06 DIAGNOSIS — M25562 Pain in left knee: Secondary | ICD-10-CM | POA: Insufficient documentation

## 2020-01-06 DIAGNOSIS — R42 Dizziness and giddiness: Secondary | ICD-10-CM | POA: Diagnosis present

## 2020-01-06 NOTE — Therapy (Signed)
[facility] [address], [place], [address] Phone: [phone]   Fax:  [phone]  Physical Therapy Treatment  Patient Details  Name: Destiny Shaw MRN: [id number] Date of Birth: 12/08/1950 Referring Provider (PT): Paralee Cancel, MD   Encounter Date: 01/06/2020   PT End of Session - 01/06/20 1300    Visit Number 92    Number of Visits 118    Date for PT Re-Evaluation 01/14/20    Authorization Type 2    Authorization Time Period of 10 progress note Medicare    PT Start Time 1300    PT Stop Time 3491    PT Time Calculation (min) 65 min    Activity Tolerance Patient tolerated treatment well    Behavior During Therapy WFL for tasks assessed/performed           Past Medical History:  Diagnosis Date   ADD (attention deficit disorder)    Anemia    Anginal pain ([place])    pt states has occas chest pain relates to indigestion; pt uses rest to relieve    Anxiety    Arthritis    Concussion    Depression    Diabetes mellitus without complication (HCC)    Dizziness    Fall    GERD (gastroesophageal reflux disease)    Headache    History of urinary tract infection    Hyperlipidemia    Hypertension    Hypothyroidism    IBS (irritable bowel syndrome)    Imbalance    Numbness    right leg    Numbness in both hands    comes and goes    Pneumonia    last episode approx 1 year ago    Sleep apnea    Wears glasses     Past Surgical History:  Procedure Laterality Date   BREAST CYST ASPIRATION Left 1980's   neg   BREAST CYST EXCISION Right 1980's   neg   BREAST LUMPECTOMY Right    BREAST SURGERY     CARPAL TUNNEL RELEASE     CESAREAN SECTION     times 2   COLONOSCOPY WITH PROPOFOL N/A 05/11/2015   Procedure: COLONOSCOPY WITH PROPOFOL;  Surgeon: Manya Silvas, MD;  Location: [facility] ENDOSCOPY;  Service: Endoscopy;  Laterality: N/A;   DE QUERVAIN'S RELEASE Right     ESOPHAGOGASTRODUODENOSCOPY (EGD) WITH PROPOFOL N/A 05/11/2015   Procedure: ESOPHAGOGASTRODUODENOSCOPY (EGD) WITH PROPOFOL;  Surgeon: Manya Silvas, MD;  Location: [facility] ENDOSCOPY;  Service: Endoscopy;  Laterality: N/A;   EYE SURGERY     laser surgery bilat    HERNIA REPAIR     KNEE ARTHROSCOPY     LUMBAR LAMINECTOMY/DECOMPRESSION MICRODISCECTOMY Bilateral 09/14/2015   Procedure: MICRO LUMBAR BILATERAL DECOMPRESSION L4 - L5;  Surgeon: Susa Day, MD;  Location: [facility] ORS;  Service: Orthopedics;  Laterality: Bilateral;   REDUCTION MAMMAPLASTY Bilateral 1980   RHINOPLASTY     TONSILLECTOMY     TOTAL KNEE ARTHROPLASTY Left 06/18/2016   Procedure: LEFT TOTAL KNEE ARTHROPLASTY;  Surgeon: Paralee Cancel, MD;  Location: [facility];  Service: Orthopedics;  Laterality: Left;  Adductor Block   TUBAL LIGATION     UVULOPALATOPHARYNGOPLASTY      There were no vitals filed for this visit.   Subjective Assessment - 01/06/20 1300    Subjective Pt and husband have questions pertaining to alternative treatments to R knee pain besides surgery.  Pt bought arch support. It helps with knee pain.  Did not wear it.Per husband, Dr. Alvan Dame recommends surgery. [facility] : Stem cell therapy, laser, regenerative therapy. [facility]: platal[facility] plasma therapy. Unable to get touch with them consistently so husband said no. Might go with R TKR.    Patient is accompained by: Family member    Pertinent History Low back pain. Pt states having back surgery about 2-3 years ago. After she had her L TKA, her back started aggravating her due to her walking. Pt also fell about 4 weeks ago onto her L knee. Dr. Alvan Dame checked out her L knee. L knee was fine but has some inflammation.  Pt tries to keep it iced. Pt still recovering for her L TKA but the fall set her back.   Her L knee surgery was last year.  L knee still bothers her a lot.  Had surgery for her back before her L knee surgery. Was doing well until her L knee  surgery. The fall made it worse. Feels debilitating. Also has a hard time getting into and out of the car mainly due to her L knee.   Pt fell due to her L knee bucking on her. Pt was trying to pick something up from the side of the couch.  Also has a hard time getting up from the floor.  Pt states having bladder accidents since after her fall (pt was recommended to tell her MD about it).  Bowel problems since taking medications (was constapated, but after taking medications, pt had diarrhea which was difficult to control. Getting out of the metformin fixed the bowel issue).  Pt states tingling and numbness L lateral LE along the L5 dermatome.  Denies saddle anesthesia.  Pt states that her back problem is mainly on her L side.  Pt landed on her L knee when she fell.  No other falls within the last 6 months. Pt also states being very dizzy since her L knee surgery.  Had PT treatement for her dizziness before which did not help.  The room feels like it is spinning when she gets dizzy.  I feel like it is getting better then she does something.  5/10 low back soreness currently (7/10 at most for the past 7 days after negotiating stairs, 5/10 without doing stairs). 4/10 L knee pain currenglty with gait 5/10 L knee pain at most for the past 7 days.    Patient Stated Goals Be better able to get into and out of her vehicle (midsized truck), into and out of chairs, be better able to roll in her bed with less L knee pain. Be able to get down on the floor and get back up.     Currently in Pain? Yes    Pain Score --   no pain level reported                                    PT Education - 01/06/20 1419    Education Details Continue wearing arch supports since it is helping. Continue performing her HEP. Ther-ex    Person(s) Educated Patient    Methods Explanation;Demonstration;Tactile cues;Verbal cues    Comprehension Returned demonstration;Verbalized understanding           Objectives     MedbridgeAccess Code: [email or web address]  Latex free bands usedif used   Sitting posture: L trunk rotation  Standing posture: slightLlateral shift   R knee exercises: Seated R hip extension  isometrics, seated R hip ER, seated hip adduction isometrics, hip IR, R piriformis stretch   Pt observed to ambulate with SPC on R side.  Gait training  Adjusted SPC to proper height  Gait training with SPC on L secondary to R knee pain 100 ft. Feels better per pt  Gait 100 + ft without AD after session, cues for increasing step length.   Improved exercise technique, movement at target joints, use of target muscles after mod verbal, visual, tactile cues.      Therapeutic exercise  Manually resisted eccentric R knee extension targeting the medial hamstrings   R 10x3  Seated R gastroc stretch with strap 30 seconds x 3  Improved exercise technique, movement at target joints, use of target muscles after mod verbal, visual, tactile cues.      Manual therapy  Seated STM R posterior leg and ankle to decrease fascial restrictions  Seated STM R plantar foot to decrease fascial restrictions seated A to P to R tarsalmetatarsal joint grade 3 to promote mobility.     Response to treatment Decreased R knee pain during gait after session.   Clinical impression  Time spent listening to pt subjective pertaining to alternative treatments for R knee pain to try to not have surgery. Pt reports the arch supports helping her but not currently wearing them and reports of knee pain. Pt was recommended to continue wearing her arch supports secondary to good results. Pt verbalized understanding. Worked on improving medial hamstrings muscle activation, ankle DF ROM, and decreasing soft tissue restrictions in her foot to promote better R knee mechanics in closed chain such as with ambulation. Decreased R knee pain during gait after treatment. Pt will benefit from  continued skilled physical therapy services to decrease pain, improve strength and function.       PT Short Term Goals - 11/18/19 1313      PT SHORT TERM GOAL #1   Title Patient will be independent with her HEP to help decrease back and L knee pain and improve ability to perform functional tasks.     Time 3    Period Weeks    Status Achieved    Target Date 07/02/19             PT Long Term Goals - 12/16/19 1308      PT LONG TERM GOAL #1   Title Patient will have a decrease in back pain to 3/10 or less at worst to promote ability to ambulate, turn in bed, perform standing tasks with less pain.     Baseline 6/10 back pain at most (09/24/2017); 7-8/10 at most for the past 7 days, duration of pain is better since starting PT (10/29/2017); 5-6/10 at most for the past 7 days (11/18/2017); 8/10 at most for the past 7 days but the duration of pain is less compared to prior to starting PT (12/12/2017).  7/10 back pain at most for the past 7 days (pt states doing her exercises helps decrease her pain; 01/02/2018); walking (3/10), standing (4/10), bed mobility (0/10) 02/05/2018; 6/10 back pain at most for the past 7 days (03/03/2018); 6.5/10 back pain at most for the past 7 days (03/27/2018); 7-8/10 at most (05/08/2018); 7/10 back pain at worst (07/22/2018);  7-8/10 at most for the past 7 days (09/22/2018); 5-7/10 at worst for the past 7 days then calms down (10/27/2018); 6/10 back pain at most for the past 7 days (when pt cooks and cleans up in kitchen; 12/17/2018); 6-7/10 back  spams pain at most for the past 7 days, usually when she is doing stuff in the kitchen (01/29/2019); 7/10 at worst for the past 7 days (03/25/2019); 7.5/10 (05/20/19); 8/10 low back pain at most for the past 7 days (06/10/2019); 7/10 at worst; Easier to decrease back pain since starting PT (07/22/2019). 4-5/10 at most for the past 7 days (09/16/2019); 7/10 at most then does her stretches to help ease it off (11/18/2019); 5/10 back pain at most for  the past 7 days (12/16/2019)    Time 8    Period Weeks    Status On-going    Target Date 01/14/20      PT LONG TERM GOAL #2   Title Patient will have a decrease in L knee pain to 3/10 or less at worst to promote ability to ambulate, negotiate stairs, get into and out of a car more comfortably.     Baseline 6/10 L knee pain at most for the past 3 months (09/24/2017); 7-8/10 at most for the past 7 days, duration of pain is better since starting PT (10/29/2017); 7/10 at most for the past 7 days (11/18/2017); 6/10 L knee pain at most for the past 7 days (12/12/2017); 5/10 L knee pain at most for the past 7 days (pt states doing her exercises helps decrease pain; 01/02/2018); L knee 3/10 (02/05/2018); 4/10 at most for the past 7 days (03/03/2018); 5.5/10 L knee at most for the past 7 days (03/27/2018); 6-7/10 L knee pain at most (05/08/2018); 7/10 L knee pain at worst for the past month (07/22/2018);  6/10 L knee pain at most for the past 7 days (09/22/2018); 5-6/10 at most for the past 7 days sporadically, can get it better with exercise (10/27/2018); 6/10 L knee ache at most for the past 7 days (12/17/2018); 5-6/10 L knee pain at most for the past 7 days such as going up and down steps (01/29/2019); 5/10 at most for the past 7 days (03/25/2019); 5.5/10 (05/20/19); 4/10 (06/10/2019); 4/10 (07/22/2019); 4/10 L knee pain at most for the past 7 days (09/16/2019); 7/10 at worst such as during stair negotiaton or walking on uneven surfaces. Frequency and duraction of worst pain level is less. (11/18/2019); 4/10 at most for the past 7 days (12/16/2019)    Time 8    Period Weeks    Status Partially Met    Target Date 01/14/20      PT LONG TERM GOAL #3   Title Patient will improve TUG time to 12 seconds or less as a demonstration of improved functional mobility and balance.     Baseline TUG no AD: 16.05 seconds on average (09/24/2017); 14.3 seconds on average (10/29/2017); 13 seconds average (11/21/2017); 16.67 seconds average  (12/12/2017);  14.17 seconds average (01/02/2018); 11.8 sec sonds (02/05/2018)    Time 6    Period Weeks    Status Achieved      PT LONG TERM GOAL #4   Title Patient will improve her back FOTO score by at least 10 points as a demonstration of improved funtion.      Baseline Back FOTO: 33 (09/24/2017); 38 (10/29/2017); 36 (11/21/2017); 40 (12/12/2017); 42 (01/02/2018); 47 (03/03/2018)    Time 6    Period Weeks    Status Achieved      PT LONG TERM GOAL #5   Title Patient will improve bilateral LE strength by at least 1/2 MMT grade to promote ability to perform standing tasks.     Time 6  Period Weeks    Status Achieved      PT LONG TERM GOAL #6   Title Patient will improve her Modified Oswestry Low Back pain disablity questionnaire by at least 10% as a demonstration of improved function.     Baseline 48% (09/24/2017); 46% (10/29/2017), (11/21/2017); 56% (12/12/2017); 36% (01/02/2018), (03/03/2018)    Time 6    Period Weeks    Status Achieved      PT LONG TERM GOAL #7   Title Pt will report decreased difficulty stepping into and out of her bathtub as well as getting into and out of her truck to promote ability to get into and out of places.     Baseline Pt reports difficulty stepping into and out of her bathtub as well as difficulty getting into and out of her truck (01/02/2018); Pt states the truck and bath tub and difficult and painful.  (02/05/2018); Improving per subjective reports (03/03/2018); decreased difficulty with truck tranfser after cues for technique and practice, decreasing difficulty with stepping into and out of the bath tub based on subjective reports (03/27/2018 and 04/01/2018); Decreasing difficulty stepping into a bathtub. No difficulty getting into and out of her truck (05/08/2018), (07/22/2018); Difficulty stepping into and out of the bath tub. No difficulty with truck transfers (09/22/2018); Able to get into her bath tub, difficulty getting out (10/27/18); Still has difficulty getting into  and out of bathtub (02/23/2019); Better able to step into the bathtub. Still has difficulty stepping out of the bathtub due to knee flexion (03/25/2019); No difficulty stepping into her bath tub. Still has difficulty stepping out due to decreased L knee flexion ROM (05/20/2019), (06/10/2019), (07/22/2019), (09/16/2019). Getting into and out of her bath tub to stand and take a shower is easier (11/18/2019)    Time 8    Period Weeks    Status Achieved      PT LONG TERM GOAL #8   Title Patient will improve her BERG balance test score to 46/56 or more as a demonstration of decreased fall risk     Baseline 42/56 (01/02/2018); 54/56 (02/05/2018)    Time 6    Period Weeks    Status Achieved      PT LONG TERM GOAL  #9   TITLE Pt will report minimal to no dizziness related to vertigo to promote balance and decrease fall risk.     Baseline Pt reports dizziness (related to vertigo) which seem to affect her balance (01/15/2018); Patient reports "a little bit every now and then" (02/05/2018); Pt reports dizziness and decreased balance (01/29/2019); Dizziness has improved some (02/23/2019), (03/25/2019); Still has dizziness and vertigo; does the Epley for both sides instead of just the L side (06/10/2019), Dizziness has decreased. Does Epley for L side (09/16/2019), Pt still has dizziness but does her Epley for both sides. Pt was recommended to do Epley for L side only secondary to L side being positive and performing it to the R will undo her progress. (11/18/2019);  Dizziness is getting better (12/16/2019)    Time 8    Period Weeks    Status On-going    Target Date 01/14/20      PT LONG TERM GOAL  #10   TITLE Pt will be able to turn 360 degrees to the R and L at least 3 times without dizziness and no LOB to promote safety and decrease fall risk.    Baseline 360 degree turn to the R and L causes a loss of balance (12/17/2018), (  01/29/2019). able to perform 3 times with each side with improved balance, minimal dizziness.  (02/23/2019); Dizziness (05/20/19), slight dizziness (06/10/2019), (07/22/2019), (09/16/2019). Dizziness for R turn, no dizziness with L turn, no LOB (11/18/2019).    Time 8    Period Weeks    Status On-going    Target Date 01/14/20      PT LONG TERM GOAL  #11   TITLE Patient will have a decrease in R hip and knee pain to 4/10 or less at worst to promote ability to ambulate with less discomfort.    Baseline 7/10 R hip and knee pain at worst for the past month (03/25/2019); 5/10 R hip pain, 6/10 R knee pain at most for the past 7 days (05/20/19); 5.5/10 R knee pain (05/20/19); 8/10 R hip; 6/10 R knee at most for the past 7 days (06/10/2019); 7/10, easier to decrease pain since starting PT (07/22/2019); 6/10  R hip, and 6/10 R knee at most for the past 7 days (09/16/2019); 6/10 R hip and knee at most for the past 7 days secondary to being stiff. Moving more feels better.  (11/18/2019); 5/10 R hip, 8/10 R knee at most for the past 7 days (12/16/2019)    Time 8    Period Weeks    Status On-going    Target Date 01/14/20                 Plan - 01/06/20 1251    Clinical Impression Statement Time spent listening to pt subjective pertaining to alternative treatments for R knee pain to try to not have surgery. Pt reports the arch supports helping her but not currently wearing them and reports of knee pain. Pt was recommended to continue wearing her arch supports secondary to good results. Pt verbalized understanding. Worked on improving medial hamstrings muscle activation, ankle DF ROM, and decreasing soft tissue restrictions in her foot to promote better R knee mechanics in closed chain such as with ambulation. Decreased R knee pain during gait after treatment. Pt will benefit from continued skilled physical therapy services to decrease pain, improve strength and function.    Personal Factors and Comorbidities Age;Comorbidity 3+;Time since onset of injury/illness/exacerbation;Fitness;Past/Current Experience     Comorbidities Back, knee pain, hx of dizziness, hx of back surgery, hx of C-section; memory lapse    Examination-Activity Limitations Squat    Stability/Clinical Decision Making Stable/Uncomplicated    Rehab Potential Fair    Clinical Impairments Affecting Rehab Potential difficulty with carry-over of improvements in clinic, chronicity of condition, age, multiple problem areas; MEMORY lapse    PT Frequency 1x / week    PT Duration 8 weeks    PT Treatment/Interventions Therapeutic activities;Therapeutic exercise;Balance training;Neuromuscular re-education;Patient/family education;Manual techniques;Dry needling;Aquatic Therapy;Electrical Stimulation;Iontophoresis 3m/ml Dexamethasone;Gait training;Canalith Repostioning;Vestibular    PT Next Visit Plan  hip and knee strengthening, core strengthening, patellar mobility, manual techniques, modalities PRN    PT Home Exercise Plan Medbridge Access Code: [email or web address]    Consulted and Agree with Plan of Care Patient           Patient will benefit from skilled therapeutic intervention in order to improve the following deficits and impairments:  Pain, Improper body mechanics, Postural dysfunction, Dizziness, Decreased strength, Difficulty walking, Decreased balance  Visit Diagnosis: Muscle weakness (generalized)  Right knee pain, unspecified chronicity     Problem List Patient Active Problem List   Diagnosis Date Noted   Obese 06/20/2016   Status post right knee replacement 06/19/2016   S/P left TKA  06/18/2016   Spinal stenosis of lumbar region 09/14/2015    Joneen Boers PT, DPT   01/06/2020, 2:21 PM  [facility] [address], [place], [address] Phone: [phone]   Fax:  [phone]  Name: Destiny Shaw MRN: [id number] Date of Birth: 1950-06-29

## 2020-01-14 ENCOUNTER — Other Ambulatory Visit: Payer: Self-pay

## 2020-01-14 ENCOUNTER — Ambulatory Visit: Payer: Medicare Other

## 2020-01-14 DIAGNOSIS — M25562 Pain in left knee: Secondary | ICD-10-CM

## 2020-01-14 DIAGNOSIS — Z9181 History of falling: Secondary | ICD-10-CM

## 2020-01-14 DIAGNOSIS — M6281 Muscle weakness (generalized): Secondary | ICD-10-CM

## 2020-01-14 DIAGNOSIS — M5416 Radiculopathy, lumbar region: Secondary | ICD-10-CM

## 2020-01-14 DIAGNOSIS — G8929 Other chronic pain: Secondary | ICD-10-CM

## 2020-01-14 DIAGNOSIS — R42 Dizziness and giddiness: Secondary | ICD-10-CM

## 2020-01-14 DIAGNOSIS — M25561 Pain in right knee: Secondary | ICD-10-CM

## 2020-01-14 DIAGNOSIS — M25551 Pain in right hip: Secondary | ICD-10-CM

## 2020-01-14 NOTE — Therapy (Signed)
[facility] [address] [place], [address] Phone: [phone]   Fax:  [phone]  Physical Therapy Treatment  Patient Details  Name: Destiny Shaw MRN: [id number] Date of Birth: 07-27-1950 Referring Provider (PT): Paralee Cancel, MD   Encounter Date: 01/14/2020   PT End of Session - 01/14/20 1349    Visit Number 93    Number of Visits 134    Date for PT Re-Evaluation 03/10/20    Authorization Type 3    Authorization Time Period of 10 progress note Medicare    PT Start Time 5456    PT Stop Time 1429    PT Time Calculation (min) 40 min    Activity Tolerance Patient tolerated treatment well    Behavior During Therapy [facility] for tasks assessed/performed           Past Medical History:  Diagnosis Date  . ADD (attention deficit disorder)   . Anemia   . Anginal pain ([place])    pt states has occas chest pain relates to indigestion; pt uses rest to relieve   . Anxiety   . Arthritis   . Concussion   . Depression   . Diabetes mellitus without complication ([place])   . Dizziness   . Fall   . GERD (gastroesophageal reflux disease)   . Headache   . History of urinary tract infection   . Hyperlipidemia   . Hypertension   . Hypothyroidism   . IBS (irritable bowel syndrome)   . Imbalance   . Numbness    right leg   . Numbness in both hands    comes and goes   . Pneumonia    last episode approx 1 year ago   . Sleep apnea   . Wears glasses     Past Surgical History:  Procedure Laterality Date  . BREAST CYST ASPIRATION Left 1980's   neg  . BREAST CYST EXCISION Right 1980's   neg  . BREAST LUMPECTOMY Right   . BREAST SURGERY    . CARPAL TUNNEL RELEASE    . CESAREAN SECTION     times 2  . COLONOSCOPY WITH PROPOFOL N/A 05/11/2015   Procedure: COLONOSCOPY WITH PROPOFOL;  Surgeon: Manya Silvas, MD;  Location: [facility] ENDOSCOPY;  Service: Endoscopy;  Laterality: N/A;  . DE QUERVAIN'S RELEASE Right   .  ESOPHAGOGASTRODUODENOSCOPY (EGD) WITH PROPOFOL N/A 05/11/2015   Procedure: ESOPHAGOGASTRODUODENOSCOPY (EGD) WITH PROPOFOL;  Surgeon: Manya Silvas, MD;  Location: [facility] ENDOSCOPY;  Service: Endoscopy;  Laterality: N/A;  . EYE SURGERY     laser surgery bilat   . HERNIA REPAIR    . KNEE ARTHROSCOPY    . LUMBAR LAMINECTOMY/DECOMPRESSION MICRODISCECTOMY Bilateral 09/14/2015   Procedure: MICRO LUMBAR BILATERAL DECOMPRESSION L4 - L5;  Surgeon: Susa Day, MD;  Location: [facility] ORS;  Service: Orthopedics;  Laterality: Bilateral;  . REDUCTION MAMMAPLASTY Bilateral 1980  . RHINOPLASTY    . TONSILLECTOMY    . TOTAL KNEE ARTHROPLASTY Left 06/18/2016   Procedure: LEFT TOTAL KNEE ARTHROPLASTY;  Surgeon: Paralee Cancel, MD;  Location: [facility] ORS;  Service: Orthopedics;  Laterality: Left;  Adductor Block  . TUBAL LIGATION    . UVULOPALATOPHARYNGOPLASTY      There were no vitals filed for this visit.   Subjective Assessment - 01/14/20 1353    Subjective Currently wearing inserts in her shoes and its helping. Still has a lot of R knee pain. 6/10 R knee pain while walking. Also feels  R lateral thigh pain.    Patient is accompained by: Family member    Pertinent History Low back pain. Pt states having back surgery about 2-3 years ago. After she had her L TKA, her back started aggravating her due to her walking. Pt also fell about 4 weeks ago onto her L knee. Dr. Alvan Dame checked out her L knee. L knee was fine but has some inflammation.  Pt tries to keep it iced. Pt still recovering for her L TKA but the fall set her back.   Her L knee surgery was last year.  L knee still bothers her a lot.  Had surgery for her back before her L knee surgery. Was doing well until her L knee surgery. The fall made it worse. Feels debilitating. Also has a hard time getting into and out of the car mainly due to her L knee.   Pt fell due to her L knee bucking on her. Pt was trying to pick something up from the side of the couch.  Also has a hard  time getting up from the floor.  Pt states having bladder accidents since after her fall (pt was recommended to tell her MD about it).  Bowel problems since taking medications (was constapated, but after taking medications, pt had diarrhea which was difficult to control. Getting out of the metformin fixed the bowel issue).  Pt states tingling and numbness L lateral LE along the L5 dermatome.  Denies saddle anesthesia.  Pt states that her back problem is mainly on her L side.  Pt landed on her L knee when she fell.  No other falls within the last 6 months. Pt also states being very dizzy since her L knee surgery.  Had PT treatement for her dizziness before which did not help.  The room feels like it is spinning when she gets dizzy.  I feel like it is getting better then she does something.  5/10 low back soreness currently (7/10 at most for the past 7 days after negotiating stairs, 5/10 without doing stairs). 4/10 L knee pain currenglty with gait 5/10 L knee pain at most for the past 7 days.    Patient Stated Goals Be better able to get into and out of her vehicle (midsized truck), into and out of chairs, be better able to roll in her bed with less L knee pain. Be able to get down on the floor and get back up.     Currently in Pain? Yes    Pain Score 6                                      PT Education - 01/14/20 2226    Education Details ther-ex    Person(s) Educated Patient    Methods Explanation;Demonstration;Tactile cues;Verbal cues    Comprehension Returned demonstration;Verbalized understanding          Objectives     MedbridgeAccess Code: [email or web address]  Latex free bands usedif used   Sitting posture: L trunk rotation  Standing posture: slightLlateral shift   R knee exercises: Seated R hip extension isometrics, seated R hip ER, seated hip adduction isometrics, hip IR, R piriformis stretch   Pt observed to ambulate with SPC on R  side.    Pt states being pleased at how well her L knee is doing (01/14/2020).     Therapeutic exercise Cued pt to use cane  on the correct side, L side to take pressure off R knee joint during gait  Reviewed progress/current status with PT towards goals   Turning 360 degrees   to the R 3x. Minimal dizziness  To the L 3x minimal dizziness  Seated R gastroc stretch with strap 30 seconds x 3  Seated R hamstrings stretch 30 seconds x 2   Seated R hip extension isometrics with PT manual resistance 10x5 seconds for 3 sets   Seated R hip ER 10x3    Improved exercise technique, movement at target joints, use of target muscles after mod verbal, visual, tactile cues.      Manual therapy  Seated STM R lateral hamstrings, vastus lateralis, IT band to decrease tension    Response to treatment Decreased R knee pain during gait after session. Knee feels better per pt.    Clinical impression Pt demonstrates slight decrease in R knee and R hip pain since last measurement. Improved level of R knee comfort during gait with use of arch supports to promote better knee mechanics. Minimal change in low back and L knee pain but pt states being pleased with the overall progress with her L knee. Decreased R knee pain during gait after session. Pt will benefit from continued skilled physical therapy services to decrease pain, improve strength and function. Challenges to progress include chronicity of condition, multiple pain areas as well as memory.      PT Short Term Goals - 11/18/19 1313      PT SHORT TERM GOAL #1   Title Patient will be independent with her HEP to help decrease back and L knee pain and improve ability to perform functional tasks.     Time 3    Period Weeks    Status Achieved    Target Date 07/02/19             PT Long Term Goals - 01/14/20 1354      PT LONG TERM GOAL #1   Title Patient will have a decrease in back pain to 3/10 or less at  worst to promote ability to ambulate, turn in bed, perform standing tasks with less pain.     Baseline 6/10 back pain at most (09/24/2017); 7-8/10 at most for the past 7 days, duration of pain is better since starting PT (10/29/2017); 5-6/10 at most for the past 7 days (11/18/2017); 8/10 at most for the past 7 days but the duration of pain is less compared to prior to starting PT (12/12/2017).  7/10 back pain at most for the past 7 days (pt states doing her exercises helps decrease her pain; 01/02/2018); walking (3/10), standing (4/10), bed mobility (0/10) 02/05/2018; 6/10 back pain at most for the past 7 days (03/03/2018); 6.5/10 back pain at most for the past 7 days (03/27/2018); 7-8/10 at most (05/08/2018); 7/10 back pain at worst (07/22/2018);  7-8/10 at most for the past 7 days (09/22/2018); 5-7/10 at worst for the past 7 days then calms down (10/27/2018); 6/10 back pain at most for the past 7 days (when pt cooks and cleans up in kitchen; 12/17/2018); 6-7/10 back spams pain at most for the past 7 days, usually when she is doing stuff in the kitchen (01/29/2019); 7/10 at worst for the past 7 days (03/25/2019); 7.5/10 (05/20/19); 8/10 low back pain at most for the past 7 days (06/10/2019); 7/10 at worst; Easier to decrease back pain since starting PT (07/22/2019). 4-5/10 at most for the past 7 days (09/16/2019); 7/10 at  most then does her stretches to help ease it off (11/18/2019); 5/10 back pain at most for the past 7 days (12/16/2019); 6/10 low back pain at most for the past 7 days (01/14/2020)    Time 8    Period Weeks    Status On-going    Target Date 03/10/20      PT LONG TERM GOAL #2   Title Patient will have a decrease in L knee pain to 3/10 or less at worst to promote ability to ambulate, negotiate stairs, get into and out of a car more comfortably.     Baseline 6/10 L knee pain at most for the past 3 months (09/24/2017); 7-8/10 at most for the past 7 days, duration of pain is better since starting PT (10/29/2017);  7/10 at most for the past 7 days (11/18/2017); 6/10 L knee pain at most for the past 7 days (12/12/2017); 5/10 L knee pain at most for the past 7 days (pt states doing her exercises helps decrease pain; 01/02/2018); L knee 3/10 (02/05/2018); 4/10 at most for the past 7 days (03/03/2018); 5.5/10 L knee at most for the past 7 days (03/27/2018); 6-7/10 L knee pain at most (05/08/2018); 7/10 L knee pain at worst for the past month (07/22/2018);  6/10 L knee pain at most for the past 7 days (09/22/2018); 5-6/10 at most for the past 7 days sporadically, can get it better with exercise (10/27/2018); 6/10 L knee ache at most for the past 7 days (12/17/2018); 5-6/10 L knee pain at most for the past 7 days such as going up and down steps (01/29/2019); 5/10 at most for the past 7 days (03/25/2019); 5.5/10 (05/20/19); 4/10 (06/10/2019); 4/10 (07/22/2019); 4/10 L knee pain at most for the past 7 days (09/16/2019); 7/10 at worst such as during stair negotiaton or walking on uneven surfaces. Frequency and duraction of worst pain level is less. (11/18/2019); 4/10 at most for the past 7 days (12/16/2019);   5-6/10 L knee stiffness at most for the past 7 days but states having more function, walking increased knee pain (01/14/2020)    Time 8    Period Weeks    Status Partially Met    Target Date 03/10/20      PT LONG TERM GOAL #3   Title Patient will improve TUG time to 12 seconds or less as a demonstration of improved functional mobility and balance.     Baseline TUG no AD: 16.05 seconds on average (09/24/2017); 14.3 seconds on average (10/29/2017); 13 seconds average (11/21/2017); 16.67 seconds average (12/12/2017);  14.17 seconds average (01/02/2018); 11.8 sec sonds (02/05/2018)    Time 6    Period Weeks    Status Achieved      PT LONG TERM GOAL #4   Title Patient will improve her back FOTO score by at least 10 points as a demonstration of improved funtion.      Baseline Back FOTO: 33 (09/24/2017); 38 (10/29/2017); 36 (11/21/2017); 40  (12/12/2017); 42 (01/02/2018); 47 (03/03/2018)    Time 6    Period Weeks    Status Achieved      PT LONG TERM GOAL #5   Title Patient will improve bilateral LE strength by at least 1/2 MMT grade to promote ability to perform standing tasks.     Time 6    Period Weeks    Status Achieved      PT LONG TERM GOAL #6   Title Patient will improve her Modified Oswestry Low Back pain  disablity questionnaire by at least 10% as a demonstration of improved function.     Baseline 48% (09/24/2017); 46% (10/29/2017), (11/21/2017); 56% (12/12/2017); 36% (01/02/2018), (03/03/2018)    Time 6    Period Weeks    Status Achieved      PT LONG TERM GOAL #7   Title Pt will report decreased difficulty stepping into and out of her bathtub as well as getting into and out of her truck to promote ability to get into and out of places.     Baseline Pt reports difficulty stepping into and out of her bathtub as well as difficulty getting into and out of her truck (01/02/2018); Pt states the truck and bath tub and difficult and painful.  (02/05/2018); Improving per subjective reports (03/03/2018); decreased difficulty with truck tranfser after cues for technique and practice, decreasing difficulty with stepping into and out of the bath tub based on subjective reports (03/27/2018 and 04/01/2018); Decreasing difficulty stepping into a bathtub. No difficulty getting into and out of her truck (05/08/2018), (07/22/2018); Difficulty stepping into and out of the bath tub. No difficulty with truck transfers (09/22/2018); Able to get into her bath tub, difficulty getting out (10/27/18); Still has difficulty getting into and out of bathtub (02/23/2019); Better able to step into the bathtub. Still has difficulty stepping out of the bathtub due to knee flexion (03/25/2019); No difficulty stepping into her bath tub. Still has difficulty stepping out due to decreased L knee flexion ROM (05/20/2019), (06/10/2019), (07/22/2019), (09/16/2019). Getting into and out of  her bath tub to stand and take a shower is easier (11/18/2019)    Time 8    Period Weeks    Status Achieved      PT LONG TERM GOAL #8   Title Patient will improve her BERG balance test score to 46/56 or more as a demonstration of decreased fall risk     Baseline 42/56 (01/02/2018); 54/56 (02/05/2018)    Time 6    Period Weeks    Status Achieved      PT LONG TERM GOAL  #9   TITLE Pt will report minimal to no dizziness related to vertigo to promote balance and decrease fall risk.     Baseline Pt reports dizziness (related to vertigo) which seem to affect her balance (01/15/2018); Patient reports "a little bit every now and then" (02/05/2018); Pt reports dizziness and decreased balance (01/29/2019); Dizziness has improved some (02/23/2019), (03/25/2019); Still has dizziness and vertigo; does the Epley for both sides instead of just the L side (06/10/2019), Dizziness has decreased. Does Epley for L side (09/16/2019), Pt still has dizziness but does her Epley for both sides. Pt was recommended to do Epley for L side only secondary to L side being positive and performing it to the R will undo her progress. (11/18/2019);  Dizziness is getting better (12/16/2019)    Time 8    Period Weeks    Status On-going      PT LONG TERM GOAL  #10   TITLE Pt will be able to turn 360 degrees to the R and L at least 3 times without dizziness and no LOB to promote safety and decrease fall risk.    Baseline 360 degree turn to the R and L causes a loss of balance (12/17/2018), (01/29/2019). able to perform 3 times with each side with improved balance, minimal dizziness. (02/23/2019); Dizziness (05/20/19), slight dizziness (06/10/2019), (07/22/2019), (09/16/2019). Dizziness for R turn, no dizziness with L turn, no LOB (11/18/2019).  Minimal dizziness only both ways, no los of balance (01/14/2020)    Time 8    Period Weeks    Status On-going    Target Date 03/10/20      PT LONG TERM GOAL  #11   TITLE Patient will have a decrease  in R hip and knee pain to 4/10 or less at worst to promote ability to ambulate with less discomfort.    Baseline 7/10 R hip and knee pain at worst for the past month (03/25/2019); 5/10 R hip pain, 6/10 R knee pain at most for the past 7 days (05/20/19); 5.5/10 R knee pain (05/20/19); 8/10 R hip; 6/10 R knee at most for the past 7 days (06/10/2019); 7/10, easier to decrease pain since starting PT (07/22/2019); 6/10  R hip, and 6/10 R knee at most for the past 7 days (09/16/2019); 6/10 R hip and knee at most for the past 7 days secondary to being stiff. Moving more feels better.  (11/18/2019); 5/10 R hip, 8/10 R knee at most for the past 7 days (12/16/2019); 4-5/10 R hip and 7/10 R knee pain at most for the past 7 days.    Time 8    Period Weeks    Status On-going    Target Date 03/10/20                 Plan - 01/14/20 2227    Personal Factors and Comorbidities Age;Comorbidity 3+;Time since onset of injury/illness/exacerbation;Fitness;Past/Current Experience    Comorbidities Back, knee pain, hx of dizziness, hx of back surgery, hx of C-section; memory lapse    Examination-Activity Limitations Squat    Stability/Clinical Decision Making Stable/Uncomplicated    Clinical Decision Making Low    Rehab Potential Fair    Clinical Impairments Affecting Rehab Potential difficulty with carry-over of improvements in clinic, chronicity of condition, age, multiple problem areas; MEMORY lapse    PT Frequency 1x / week    PT Duration 8 weeks    PT Treatment/Interventions Therapeutic activities;Therapeutic exercise;Balance training;Neuromuscular re-education;Patient/family education;Manual techniques;Dry needling;Aquatic Therapy;Electrical Stimulation;Iontophoresis 14m/ml Dexamethasone;Gait training;Canalith Repostioning;Vestibular    PT Next Visit Plan  hip and knee strengthening, core strengthening, patellar mobility, manual techniques, modalities PRN    PT Home Exercise Plan Medbridge Access Code: [email or web address]     Consulted and Agree with Plan of Care Patient           Patient will benefit from skilled therapeutic intervention in order to improve the following deficits and impairments:  Pain, Improper body mechanics, Postural dysfunction, Dizziness, Decreased strength, Difficulty walking, Decreased balance  Visit Diagnosis: Right knee pain, unspecified chronicity - Plan: PT plan of care cert/re-cert  Muscle weakness (generalized) - Plan: PT plan of care cert/re-cert  Radiculopathy, lumbar region - Plan: PT plan of care cert/re-cert  Pain in right hip - Plan: PT plan of care cert/re-cert  History of falling - Plan: PT plan of care cert/re-cert  Dizziness and giddiness - Plan: PT plan of care cert/re-cert  Chronic pain of left knee - Plan: PT plan of care cert/re-cert     Problem List Patient Active Problem List   Diagnosis Date Noted  . Obese 06/20/2016  . Status post right knee replacement 06/19/2016  . S/P left TKA 06/18/2016  . Spinal stenosis of lumbar region 09/14/2015    MJoneen BoersPT, DPT   01/14/2020, 10:36 PM  [facility] [address] [place] [id number]: [phone]  Fax:  [phone]  Name: HALEY FUERSTENBERG MRN: [id number] Date of Birth: [birth year]

## 2020-01-20 ENCOUNTER — Ambulatory Visit: Payer: Medicare Other

## 2020-02-03 NOTE — Progress Notes (Signed)
DUE TO COVID-19 ONLY ONE VISITOR IS ALLOWED TO COME WITH YOU AND STAY IN THE WAITING ROOM ONLY DURING PRE OP AND PROCEDURE DAY OF SURGERY. THE 1 VISITOR  MAY VISIT WITH YOU AFTER SURGERY IN YOUR PRIVATE ROOM DURING VISITING HOURS ONLY!  YOU NEED TO HAVE A COVID 19 TEST ON___12/13/2021 ____ @_______ , THIS TEST MUST BE DONE BEFORE SURGERY,  COVID TESTING SITE [address] [place] [address], IT IS ON THE RIGHT GOING OUT [address] APPROXIMATELY  2 MINUTES PAST [employer] ON THE RIGHT. ONCE YOUR COVID TEST IS COMPLETED,  PLEASE BEGIN THE QUARANTINE INSTRUCTIONS AS OUTLINED IN YOUR HANDOUT.                Destiny Shaw  02/03/2020   Your procedure is scheduled on: 02/11/2020    Report to [facility] Main  Entrance   Report to admitting at    0730AM  AM     Call this number if you have problems the morning of surgery [phone]    REMEMBER: NO  SOLID FOOD CANDY OR GUM AFTER MIDNIGHT. CLEAR LIQUIDS UNTIL    0700AM      . NOTHING BY MOUTH EXCEPT CLEAR LIQUIDS UNTIL    . PLEASE FINISH  g2  DRINK PER SURGEON ORDER  WHICH NEEDS TO BE COMPLETED AT  0700AM     .      CLEAR LIQUID DIET   Foods Allowed                                                                    Coffee and tea, regular and decaf                            Fruit ices (not with fruit pulp)                                      Iced Popsicles                                    Carbonated beverages, regular and diet                                    Cranberry, grape and apple juices Sports drinks like Gatorade Lightly seasoned clear broth or consume(fat free) Sugar, honey syrup ___________________________________________________________________      BRUSH YOUR TEETH MORNING OF SURGERY AND RINSE YOUR MOUTH OUT, NO CHEWING GUM CANDY OR MINTS.     Take these medicines the morning of surgery with A SIP OF WATER: wELLBUTRIN, BUSPAR, tOPROL   DO NOT TAKE ANY DIABETIC MEDICATIONS DAY OF  YOUR SURGERY                               You may not have any metal on your body including hair pins and  piercings  Do not wear jewelry, make-up, lotions, powders or perfumes, deodorant             Do not wear nail polish on your fingernails.  Do not shave  48 hours prior to surgery.              Men may shave face and neck.   Do not bring valuables to the hospital. [place].  Contacts, dentures or bridgework may not be worn into surgery.  Leave suitcase in the car. After surgery it may be brought to your room.     Patients discharged the day of surgery will not be allowed to drive home. IF YOU ARE HAVING SURGERY AND GOING HOME THE SAME DAY, YOU MUST HAVE AN ADULT TO DRIVE YOU HOME AND BE WITH YOU FOR 24 HOURS. YOU MAY GO HOME BY TAXI OR UBER OR ORTHERWISE, BUT AN ADULT MUST ACCOMPANY YOU HOME AND STAY WITH YOU FOR 24 HOURS.  Name and phone number of your driver:  Special Instructions: N/A              Please read over the following fact sheets you were given: _____________________________________________________________________  [facility] - Preparing for Surgery Before surgery, you can play an important role.  Because skin is not sterile, your skin needs to be as free of germs as possible.  You can reduce the number of germs on your skin by washing with CHG (chlorahexidine gluconate) soap before surgery.  CHG is an antiseptic cleaner which kills germs and bonds with the skin to continue killing germs even after washing. Please DO NOT use if you have an allergy to CHG or antibacterial soaps.  If your skin becomes reddened/irritated stop using the CHG and inform your nurse when you arrive at Short Stay. Do not shave (including legs and underarms) for at least 48 hours prior to the first CHG shower.  You may shave your face/neck. Please follow these instructions carefully:  1.  Shower with CHG Soap the night before surgery and  the  morning of Surgery.  2.  If you choose to wash your hair, wash your hair first as usual with your  normal  shampoo.  3.  After you shampoo, rinse your hair and body thoroughly to remove the  shampoo.                           4.  Use CHG as you would any other liquid soap.  You can apply chg directly  to the skin and wash                       Gently with a scrungie or clean washcloth.  5.  Apply the CHG Soap to your body ONLY FROM THE NECK DOWN.   Do not use on face/ open                           Wound or open sores. Avoid contact with eyes, ears mouth and genitals (private parts).                       Wash face,  Genitals (private parts) with your normal soap.             6.  Wash thoroughly, paying special attention to the area where your surgery  will be performed.  7.  Thoroughly rinse your body with warm water from the neck down.  8.  DO NOT shower/wash with your normal soap after using and rinsing off  the CHG Soap.                9.  Pat yourself dry with a clean towel.            10.  Wear clean pajamas.            11.  Place clean sheets on your bed the night of your first shower and do not  sleep with pets. Day of Surgery : Do not apply any lotions/deodorants the morning of surgery.  Please wear clean clothes to the hospital/surgery center.  FAILURE TO FOLLOW THESE INSTRUCTIONS MAY RESULT IN THE CANCELLATION OF YOUR SURGERY PATIENT SIGNATURE_________________________________  NURSE SIGNATURE__________________________________  ________________________________________________________________________

## 2020-02-04 ENCOUNTER — Encounter (HOSPITAL_COMMUNITY)
Admission: RE | Admit: 2020-02-04 | Discharge: 2020-02-04 | Disposition: A | Discharge status: Home / Self Care | Payer: Medicare Other | Source: Ambulatory Visit | Attending: Orthopedic Surgery | Admitting: Orthopedic Surgery

## 2020-02-04 ENCOUNTER — Encounter (HOSPITAL_COMMUNITY): Payer: Self-pay

## 2020-02-04 ENCOUNTER — Other Ambulatory Visit: Payer: Self-pay

## 2020-02-04 DIAGNOSIS — Z01812 Encounter for preprocedural laboratory examination: Secondary | ICD-10-CM | POA: Insufficient documentation

## 2020-02-04 LAB — SURGICAL PCR SCREEN
MRSA, PCR: NEGATIVE
Staphylococcus aureus: NEGATIVE

## 2020-02-04 LAB — CBC
HCT: 38.4 % (ref 36.0–46.0)
Hemoglobin: 12.2 g/dL (ref 12.0–15.0)
MCH: 28 pg (ref 26.0–34.0)
MCHC: 31.8 g/dL (ref 30.0–36.0)
MCV: 88.1 fL (ref 80.0–100.0)
Platelets: 218 10*3/uL (ref 150–400)
RBC: 4.36 MIL/uL (ref 3.87–5.11)
RDW: 13.9 % (ref 11.5–15.5)
WBC: 6.4 10*3/uL (ref 4.0–10.5)
nRBC: 0 % (ref 0.0–0.2)

## 2020-02-04 LAB — BASIC METABOLIC PANEL
Anion gap: 10 (ref 5–15)
BUN: 14 mg/dL (ref 8–23)
CO2: 24 mmol/L (ref 22–32)
Calcium: 9.3 mg/dL (ref 8.9–10.3)
Chloride: 108 mmol/L (ref 98–111)
Creatinine, Ser: 0.73 mg/dL (ref 0.44–1.00)
GFR, Estimated: >60 mL/min (ref >60)
Glucose, Bld: 93 mg/dL (ref 70–99)
Potassium: 4.1 mmol/L (ref 3.5–5.1)
Sodium: 142 mmol/L (ref 135–145)

## 2020-02-04 LAB — HEMOGLOBIN A1C
Hgb A1c MFr Bld: 7.6 % — ABNORMAL HIGH (ref 4.8–5.6)
Mean Plasma Glucose: 171.42 mg/dL

## 2020-02-04 NOTE — Progress Notes (Addendum)
Anesthesia Review:  PCP: DR Frazier Richards  LOV 01/27/2020 Cardiologist : DR Paraschos  Chest x-ray : EKG :12/16/19- [place] to fax 12 leak ekg tracing  02/05/20- ekg on chart.  Echo :to be done 02/09/20  Stress test: to be done 02/09/20  Cardiac Cath :  Activity level: cannot do a flight of stairs without difficulty  Sleep Study/ CPAP : uses cpap not all the time per pt  Fasting Blood Sugar :      / Checks Blood Sugar -- times a day:   Blood Thinner/ Instructions /Last Dose: ASA / Instructions/ Last Dose :  Type 2 DM  81 mg Aspirin  07/14/2019- LOV - Neurology -epic- DR Manuella Ghazi  [id number] on 02/04/2020

## 2020-02-07 NOTE — H&P (Signed)
TOTAL KNEE ADMISSION H&P  Patient is being admitted for right total knee arthroplasty.  Subjective:  Chief Complaint:  Right knee OA / pain  HPI: Destiny Shaw, 69 y.o. female, has a history of pain and functional disability in the right knee due to arthritis and has failed non-surgical conservative treatments for greater than 12 weeks to include NSAID's and/or analgesics, corticosteriod injections, use of assistive devices and activity modification.  Onset of symptoms was gradual, starting 1+ years ago with gradually worsening course since that time. The patient noted prior procedures on the knee to include  arthroplasty on the left knee in 2018.  Patient currently rates pain in the right knee(s) at 9 out of 10 with activity. Patient has night pain, worsening of pain with activity and weight bearing, pain that interferes with activities of daily living, pain with passive range of motion, crepitus and joint swelling.  Patient has evidence of periarticular osteophytes and joint space narrowing by imaging studies.  There is no active infection.  Risks, benefits and expectations were discussed with the patient.  Risks including but not limited to the risk of anesthesia, blood clots, nerve damage, blood vessel damage, failure of the prosthesis, infection and up to and including death.  Patient understand the risks, benefits and expectations and wishes to proceed with surgery.   D/C Plans:       Home   Post-op Meds:       No Rx given  Tranexamic Acid:      To be given - IV   Decadron:      Is to be given  FYI:      ASA  Norco  DME:   Rx sent for - RW & 3-N-1  PT:   [place]: [facility]    Patient Active Problem List   Diagnosis Date Noted  . Obese 06/20/2016  . Status post right knee replacement 06/19/2016  . S/P left TKA 06/18/2016  . Spinal stenosis of lumbar region 09/14/2015   Past Medical History:  Diagnosis Date  . ADD (attention deficit disorder)   . Anemia    . Anginal pain ([place])    pt states has occas chest pain relates to indigestion; pt uses rest to relieve DR Kern-   . Anxiety   . Arthritis   . Concussion   . Depression   . Diabetes mellitus without complication ([place])    type 2   . Dizziness   . Fall   . GERD (gastroesophageal reflux disease)   . Headache   . History of urinary tract infection   . Hyperlipidemia   . Hypertension   . IBS (irritable bowel syndrome)   . Imbalance   . Numbness    right leg   . Numbness in both hands    comes and goes   . Pneumonia    last episode approx 1 year ago   . Sleep apnea    uses cpap not all the time   . Wears glasses     Past Surgical History:  Procedure Laterality Date  . BREAST CYST ASPIRATION Left 1980's   neg  . BREAST CYST EXCISION Right 1980's   neg  . BREAST LUMPECTOMY Right   . BREAST SURGERY    . CARPAL TUNNEL RELEASE    . CESAREAN SECTION     times 2  . COLONOSCOPY WITH PROPOFOL N/A 05/11/2015   Procedure: COLONOSCOPY WITH PROPOFOL;  Surgeon:  Manya Silvas, MD;  Location[facility] ENDOSCOPY;  Service: Endoscopy;  Laterality: N/A;  . DE QUERVAIN'S RELEASE Right   . ESOPHAGOGASTRODUODENOSCOPY (EGD) WITH PROPOFOL N/A 05/11/2015   Procedure: ESOPHAGOGASTRODUODENOSCOPY (EGD) WITH PROPOFOL;  Surgeon: Manya Silvas, MD;  Location: [facility] ENDOSCOPY;  Service: Endoscopy;  Laterality: N/A;  . EYE SURGERY     laser surgery bilat   . HERNIA REPAIR    . KNEE ARTHROSCOPY    . LUMBAR LAMINECTOMY/DECOMPRESSION MICRODISCECTOMY Bilateral 09/14/2015   Procedure: MICRO LUMBAR BILATERAL DECOMPRESSION L4 - L5;  Surgeon: Susa Day, MD;  Location: [facility] ORS;  Service: Orthopedics;  Laterality: Bilateral;  . REDUCTION MAMMAPLASTY Bilateral 1980  . RHINOPLASTY    . TONSILLECTOMY    . TOTAL KNEE ARTHROPLASTY Left 06/18/2016   Procedure: LEFT TOTAL KNEE ARTHROPLASTY;  Surgeon: Paralee Cancel, MD;  Location: [facility] ORS;  Service: Orthopedics;  Laterality: Left;  Adductor  Block  . TUBAL LIGATION    . UVULOPALATOPHARYNGOPLASTY      No current facility-administered medications for this encounter.   Current Outpatient Medications  Medication Sig Dispense Refill Last Dose  . acetaminophen (TYLENOL) 500 MG tablet Take 500 mg by mouth every 8 (eight) hours as needed for moderate pain.     Marland Kitchen aspirin EC 81 MG tablet Take 81 mg by mouth daily. Swallow whole.     Marland Kitchen buPROPion (WELLBUTRIN SR) 150 MG 12 hr tablet Take 150 mg by mouth 2 (two) times daily.     . busPIRone (BUSPAR) 10 MG tablet Take 10 mg by mouth 2 (two) times daily.     . celecoxib (CELEBREX) 200 MG capsule Take 200 mg by mouth daily.     . cholecalciferol (VITAMIN D3) 25 MCG (1000 UNIT) tablet Take 1,000 Units by mouth daily.     Marland Kitchen glipiZIDE (GLUCOTROL) 10 MG tablet Take 10 mg by mouth every evening.     Marland Kitchen lisinopril (ZESTRIL) 10 MG tablet Take 10 mg by mouth daily.     . Melatonin 10 MG TABS Take 10 mg by mouth at bedtime.     . metFORMIN (GLUCOPHAGE) 500 MG tablet Take 500 mg by mouth every evening.      . metoprolol succinate (TOPROL-XL) 25 MG 24 hr tablet Take 25 mg by mouth daily.     . pioglitazone (ACTOS) 15 MG tablet Take 15 mg by mouth daily.     . pravastatin (PRAVACHOL) 20 MG tablet Take 20 mg by mouth every evening.     . senna (SENOKOT) 8.6 MG TABS tablet Take 1 tablet by mouth daily as needed for mild constipation.     Marland Kitchen zolpidem (AMBIEN) 10 MG tablet Take 10 mg by mouth at bedtime as needed for sleep.      Allergies  Allergen Reactions  . Tessalon [Benzonatate] Hives  . Latex Rash    Social History   Tobacco Use  . Smoking status: Never Smoker  . Smokeless tobacco: Never Used  Substance Use Topics  . Alcohol use: No     Review of Systems  Constitutional: Negative.   HENT: Negative.   Eyes: Negative.   Respiratory: Negative.   Cardiovascular: Negative.   Gastrointestinal: Negative.   Genitourinary: Negative.   Musculoskeletal: Positive for joint pain.  Skin: Negative.    Neurological: Negative.   Endo/Heme/Allergies: Negative.   Psychiatric/Behavioral: Positive for memory loss.       Objective:  Physical Exam Constitutional:      Appearance: She is well-developed.  HENT:  Head: Normocephalic.  Eyes:     Pupils: Pupils are equal, round, and reactive to light.  Neck:     Thyroid: No thyromegaly.     Vascular: No JVD.     Trachea: No tracheal deviation.  Cardiovascular:     Rate and Rhythm: Normal rate and regular rhythm.     Pulses: Intact distal pulses.  Pulmonary:     Effort: Pulmonary effort is normal. No respiratory distress.     Breath sounds: Normal breath sounds. No wheezing.  Abdominal:     Palpations: Abdomen is soft.     Tenderness: There is no abdominal tenderness. There is no guarding.  Musculoskeletal:     Cervical back: Neck supple.     Right knee: Swelling and bony tenderness present. No erythema or ecchymosis. Decreased range of motion. Tenderness present.  Lymphadenopathy:     Cervical: No cervical adenopathy.  Skin:    General: Skin is warm and dry.  Neurological:     Mental Status: She is alert and oriented to person, place, and time.     Sensory: Sensory deficit (numbness bilateral LEs, R>L) present.  Psychiatric:        Mood and Affect: Mood and affect normal.       Labs:  Estimated body mass index is 32.61 kg/m as calculated from the following:   Height as of 02/04/20: 5\' 4"  (1.626 m).   Weight as of 02/04/20: 86.2 kg.   Imaging Review Plain radiographs demonstrate severe degenerative joint disease of the right knee.  The bone quality appears to be good for age and reported activity level.      Assessment/Plan:  End stage arthritis, right knee   The patient history, physical examination, clinical judgment of the provider and imaging studies are consistent with end stage degenerative joint disease of the right knee(s) and total knee arthroplasty is deemed medically necessary. The treatment options  including medical management, injection therapy arthroscopy and arthroplasty were discussed at length. The risks and benefits of total knee arthroplasty were presented and reviewed. The risks due to aseptic loosening, infection, stiffness, patella tracking problems, thromboembolic complications and other imponderables were discussed. The patient acknowledged the explanation, agreed to proceed with the plan and consent was signed. Patient is being admitted for treatment for surgery, pain control, PT, OT, prophylactic antibiotics, VTE prophylaxis, progressive ambulation and ADL's and discharge planning. The patient is planning to be discharged home.     Patient's anticipated LOS is less than 2 midnights, meeting these requirements: - Lives within 1 hour of care - Has a competent adult at home to recover with post-op recover - NO history of  - Chronic pain requiring opiods  - Coronary Artery Disease  - Heart failure  - Heart attack  - Stroke  - DVT/VTE  - Cardiac arrhythmia  - Respiratory Failure/COPD  - Renal failure  - Anemia  - Advanced Liver disease

## 2020-02-08 ENCOUNTER — Other Ambulatory Visit (HOSPITAL_COMMUNITY)
Admission: RE | Admit: 2020-02-08 | Discharge: 2020-02-08 | Disposition: A | Discharge status: Home / Self Care | Payer: Medicare Other | Source: Ambulatory Visit | Attending: Orthopedic Surgery | Admitting: Orthopedic Surgery

## 2020-02-08 DIAGNOSIS — Z20822 Contact with and (suspected) exposure to covid-19: Secondary | ICD-10-CM | POA: Diagnosis not present

## 2020-02-08 DIAGNOSIS — Z01812 Encounter for preprocedural laboratory examination: Secondary | ICD-10-CM | POA: Diagnosis present

## 2020-02-09 LAB — SARS CORONAVIRUS 2 (TAT 6-24 HRS): SARS Coronavirus 2: NEGATIVE

## 2020-02-11 ENCOUNTER — Observation Stay (HOSPITAL_COMMUNITY)
Admission: RE | Admit: 2020-02-11 | Discharge: 2020-02-12 | Disposition: A | Discharge status: Home / Self Care | Payer: Medicare Other | Attending: Orthopedic Surgery | Admitting: Orthopedic Surgery

## 2020-02-11 ENCOUNTER — Other Ambulatory Visit: Payer: Self-pay

## 2020-02-11 ENCOUNTER — Encounter (HOSPITAL_COMMUNITY)
Admission: RE | Disposition: A | Discharge status: Home / Self Care | Payer: Self-pay | Source: Home / Self Care | Attending: Orthopedic Surgery

## 2020-02-11 ENCOUNTER — Ambulatory Visit (HOSPITAL_COMMUNITY): Payer: Medicare Other | Admitting: Physician Assistant

## 2020-02-11 ENCOUNTER — Encounter (HOSPITAL_COMMUNITY): Payer: Self-pay | Admitting: Orthopedic Surgery

## 2020-02-11 ENCOUNTER — Ambulatory Visit (HOSPITAL_COMMUNITY): Payer: Medicare Other | Admitting: Certified Registered Nurse Anesthetist

## 2020-02-11 DIAGNOSIS — Z96652 Presence of left artificial knee joint: Secondary | ICD-10-CM | POA: Diagnosis not present

## 2020-02-11 DIAGNOSIS — Z7984 Long term (current) use of oral hypoglycemic drugs: Secondary | ICD-10-CM | POA: Diagnosis not present

## 2020-02-11 DIAGNOSIS — Z96651 Presence of right artificial knee joint: Secondary | ICD-10-CM

## 2020-02-11 DIAGNOSIS — Z7982 Long term (current) use of aspirin: Secondary | ICD-10-CM | POA: Diagnosis not present

## 2020-02-11 DIAGNOSIS — Z91040 Latex allergy status: Secondary | ICD-10-CM | POA: Diagnosis not present

## 2020-02-11 DIAGNOSIS — Z79899 Other long term (current) drug therapy: Secondary | ICD-10-CM | POA: Insufficient documentation

## 2020-02-11 DIAGNOSIS — M25561 Pain in right knee: Secondary | ICD-10-CM | POA: Diagnosis present

## 2020-02-11 DIAGNOSIS — M1711 Unilateral primary osteoarthritis, right knee: Principal | ICD-10-CM | POA: Diagnosis present

## 2020-02-11 DIAGNOSIS — I10 Essential (primary) hypertension: Secondary | ICD-10-CM | POA: Insufficient documentation

## 2020-02-11 DIAGNOSIS — E669 Obesity, unspecified: Secondary | ICD-10-CM | POA: Diagnosis present

## 2020-02-11 DIAGNOSIS — E119 Type 2 diabetes mellitus without complications: Secondary | ICD-10-CM | POA: Diagnosis not present

## 2020-02-11 HISTORY — PX: TOTAL KNEE ARTHROPLASTY: SHX125

## 2020-02-11 LAB — GLUCOSE, CAPILLARY
Glucose-Capillary: 158 mg/dL — ABNORMAL HIGH (ref 70–99)
Glucose-Capillary: 125 mg/dL — ABNORMAL HIGH (ref 70–99)
Glucose-Capillary: 272 mg/dL — ABNORMAL HIGH (ref 70–99)
Glucose-Capillary: 204 mg/dL — ABNORMAL HIGH (ref 70–99)

## 2020-02-11 LAB — TYPE AND SCREEN
ABO/RH(D): O POS
Antibody Screen: NEGATIVE

## 2020-02-11 SURGERY — ARTHROPLASTY, KNEE, TOTAL
Anesthesia: Spinal | Site: Knee | Laterality: Right

## 2020-02-11 MED ORDER — FENTANYL CITRATE (PF) 100 MCG/2ML IJ SOLN: 50.0000 ug | INTRAMUSCULAR | Status: DC

## 2020-02-11 MED ORDER — GLIPIZIDE ER 5 MG PO TB24: 10.0000 mg | ORAL_TABLET | Freq: Every day | ORAL | Status: DC

## 2020-02-11 MED ORDER — PRAVASTATIN SODIUM 20 MG PO TABS
20.0000 mg | ORAL_TABLET | Freq: Every evening | ORAL | Status: DC
  Administered 2020-02-11: 17:00:00 20 mg via ORAL
  Filled 2020-02-11: qty 1

## 2020-02-11 MED ORDER — MIDAZOLAM HCL 2 MG/2ML IJ SOLN: 1.0000 mg | INTRAMUSCULAR | Status: DC

## 2020-02-11 MED ORDER — ONDANSETRON HCL 4 MG/2ML IJ SOLN: 4.0000 mg | Freq: Four times a day (QID) | INTRAMUSCULAR | Status: DC | PRN

## 2020-02-11 MED ORDER — METHOCARBAMOL 1000 MG/10ML IJ SOLN
500.0000 mg | Freq: Four times a day (QID) | INTRAVENOUS | Status: DC | PRN
  Filled 2020-02-11: qty 5

## 2020-02-11 MED ORDER — DIPHENHYDRAMINE HCL 12.5 MG/5ML PO ELIX: 12.5000 mg | ORAL_SOLUTION | ORAL | Status: DC | PRN

## 2020-02-11 MED ORDER — METOCLOPRAMIDE HCL 5 MG PO TABS: 5.0000 mg | ORAL_TABLET | Freq: Three times a day (TID) | ORAL | Status: DC | PRN

## 2020-02-11 MED ORDER — SODIUM CHLORIDE (PF) 0.9 % IJ SOLN
INTRAMUSCULAR | Status: DC | PRN
  Administered 2020-02-11: 30 mL

## 2020-02-11 MED ORDER — MIDAZOLAM HCL 2 MG/2ML IJ SOLN
INTRAMUSCULAR | Status: AC
  Filled 2020-02-11: qty 2

## 2020-02-11 MED ORDER — BUPROPION HCL ER (SR) 150 MG PO TB12
150.0000 mg | ORAL_TABLET | Freq: Two times a day (BID) | ORAL | Status: DC
  Administered 2020-02-11 – 2020-02-12 (×2): 150 mg via ORAL
  Filled 2020-02-11 (×2): qty 1

## 2020-02-11 MED ORDER — PHENYLEPHRINE 40 MCG/ML (10ML) SYRINGE FOR IV PUSH (FOR BLOOD PRESSURE SUPPORT)
PREFILLED_SYRINGE | INTRAVENOUS | Status: AC
  Filled 2020-02-11: qty 10

## 2020-02-11 MED ORDER — ACETAMINOPHEN 325 MG PO TABS
325.0000 mg | ORAL_TABLET | Freq: Four times a day (QID) | ORAL | Status: DC | PRN
Start: 2020-02-12 — End: 2020-02-12

## 2020-02-11 MED ORDER — MENTHOL 3 MG MT LOZG: 1.0000 | LOZENGE | OROMUCOSAL | Status: DC | PRN

## 2020-02-11 MED ORDER — BUSPIRONE HCL 5 MG PO TABS
10.0000 mg | ORAL_TABLET | Freq: Two times a day (BID) | ORAL | Status: DC
  Administered 2020-02-11 – 2020-02-12 (×2): 10 mg via ORAL
  Filled 2020-02-11 (×2): qty 2

## 2020-02-11 MED ORDER — ONDANSETRON HCL 4 MG/2ML IJ SOLN
INTRAMUSCULAR | Status: DC | PRN
  Administered 2020-02-11: 4 mg via INTRAVENOUS

## 2020-02-11 MED ORDER — ONDANSETRON HCL 4 MG/2ML IJ SOLN: 4.0000 mg | Freq: Once | INTRAMUSCULAR | Status: DC | PRN

## 2020-02-11 MED ORDER — METHOCARBAMOL 500 MG PO TABS
500.0000 mg | ORAL_TABLET | Freq: Four times a day (QID) | ORAL | Status: DC | PRN
  Administered 2020-02-11 – 2020-02-12 (×3): 500 mg via ORAL
  Filled 2020-02-11 (×3): qty 1

## 2020-02-11 MED ORDER — METOCLOPRAMIDE HCL 5 MG/ML IJ SOLN: 5.0000 mg | Freq: Three times a day (TID) | INTRAMUSCULAR | Status: DC | PRN

## 2020-02-11 MED ORDER — FENTANYL CITRATE (PF) 100 MCG/2ML IJ SOLN
INTRAMUSCULAR | Status: AC
  Administered 2020-02-11: 09:00:00 100 ug via INTRAVENOUS
  Filled 2020-02-11: qty 2

## 2020-02-11 MED ORDER — HYDROMORPHONE HCL 1 MG/ML IJ SOLN: 0.2500 mg | INTRAMUSCULAR | Status: DC | PRN

## 2020-02-11 MED ORDER — TRANEXAMIC ACID-NACL 1000-0.7 MG/100ML-% IV SOLN
1000.0000 mg | INTRAVENOUS | Status: AC
  Administered 2020-02-11: 10:00:00 1000 mg via INTRAVENOUS
  Filled 2020-02-11: qty 100

## 2020-02-11 MED ORDER — EPHEDRINE 5 MG/ML INJ
INTRAVENOUS | Status: AC
  Filled 2020-02-11: qty 10

## 2020-02-11 MED ORDER — METOPROLOL SUCCINATE ER 25 MG PO TB24
25.0000 mg | ORAL_TABLET | Freq: Every day | ORAL | Status: DC
Start: 2020-02-11 — End: 2020-02-12
  Administered 2020-02-11 – 2020-02-12 (×2): 25 mg via ORAL
  Filled 2020-02-11 (×2): qty 1

## 2020-02-11 MED ORDER — POVIDONE-IODINE 10 % EX SWAB
2.0000 "application " | Freq: Once | CUTANEOUS | Status: AC
  Administered 2020-02-11: 2 via TOPICAL

## 2020-02-11 MED ORDER — SODIUM CHLORIDE (PF) 0.9 % IJ SOLN
INTRAMUSCULAR | Status: AC
  Filled 2020-02-11: qty 50

## 2020-02-11 MED ORDER — CELECOXIB 200 MG PO CAPS
200.0000 mg | ORAL_CAPSULE | Freq: Two times a day (BID) | ORAL | Status: DC
  Administered 2020-02-11 – 2020-02-12 (×2): 200 mg via ORAL
  Filled 2020-02-11 (×2): qty 1

## 2020-02-11 MED ORDER — ONDANSETRON HCL 4 MG PO TABS: 4.0000 mg | ORAL_TABLET | Freq: Four times a day (QID) | ORAL | Status: DC | PRN

## 2020-02-11 MED ORDER — ZOLPIDEM TARTRATE 5 MG PO TABS
5.0000 mg | ORAL_TABLET | Freq: Every evening | ORAL | Status: DC | PRN
  Administered 2020-02-11: 21:00:00 5 mg via ORAL
  Filled 2020-02-11: qty 1

## 2020-02-11 MED ORDER — PROPOFOL 10 MG/ML IV BOLUS
INTRAVENOUS | Status: DC | PRN
  Administered 2020-02-11 (×2): 20 mg via INTRAVENOUS

## 2020-02-11 MED ORDER — CEFAZOLIN SODIUM-DEXTROSE 2-4 GM/100ML-% IV SOLN
2.0000 g | INTRAVENOUS | Status: AC
  Administered 2020-02-11: 10:00:00 2 g via INTRAVENOUS
  Filled 2020-02-11: qty 100

## 2020-02-11 MED ORDER — FERROUS SULFATE 325 (65 FE) MG PO TABS
325.0000 mg | ORAL_TABLET | Freq: Two times a day (BID) | ORAL | Status: DC
  Administered 2020-02-12: 08:00:00 325 mg via ORAL
  Filled 2020-02-11: qty 1

## 2020-02-11 MED ORDER — KETOROLAC TROMETHAMINE 30 MG/ML IJ SOLN
INTRAMUSCULAR | Status: DC | PRN
  Administered 2020-02-11: 30 mg

## 2020-02-11 MED ORDER — DEXAMETHASONE SODIUM PHOSPHATE 10 MG/ML IJ SOLN
INTRAMUSCULAR | Status: AC
  Filled 2020-02-11: qty 1

## 2020-02-11 MED ORDER — LISINOPRIL 10 MG PO TABS
10.0000 mg | ORAL_TABLET | Freq: Every day | ORAL | Status: DC
  Administered 2020-02-12: 10:00:00 10 mg via ORAL
  Filled 2020-02-11: qty 1

## 2020-02-11 MED ORDER — PIOGLITAZONE HCL 15 MG PO TABS
15.0000 mg | ORAL_TABLET | Freq: Every day | ORAL | Status: DC
  Administered 2020-02-11 – 2020-02-12 (×2): 15 mg via ORAL
  Filled 2020-02-11 (×2): qty 1

## 2020-02-11 MED ORDER — HYDROCODONE-ACETAMINOPHEN 5-325 MG PO TABS
1.0000 | ORAL_TABLET | ORAL | Status: DC | PRN
  Administered 2020-02-11 – 2020-02-12 (×4): 2 via ORAL
  Filled 2020-02-11 (×4): qty 2

## 2020-02-11 MED ORDER — BISACODYL 10 MG RE SUPP
10.0000 mg | Freq: Every day | RECTAL | Status: DC | PRN
Start: 2020-02-11 — End: 2020-02-12

## 2020-02-11 MED ORDER — PROPOFOL 500 MG/50ML IV EMUL
INTRAVENOUS | Status: DC | PRN
  Administered 2020-02-11: 70 ug/kg/min via INTRAVENOUS

## 2020-02-11 MED ORDER — GLIPIZIDE 10 MG PO TABS: 10.0000 mg | ORAL_TABLET | Freq: Every evening | ORAL | Status: DC

## 2020-02-11 MED ORDER — ORAL CARE MOUTH RINSE: 15.0000 mL | Freq: Once | OROMUCOSAL | Status: AC

## 2020-02-11 MED ORDER — POLYETHYLENE GLYCOL 3350 17 G PO PACK
17.0000 g | PACK | Freq: Two times a day (BID) | ORAL | Status: DC
  Administered 2020-02-11 – 2020-02-12 (×2): 17 g via ORAL
  Filled 2020-02-11 (×2): qty 1

## 2020-02-11 MED ORDER — CEFAZOLIN SODIUM-DEXTROSE 2-4 GM/100ML-% IV SOLN
2.0000 g | Freq: Four times a day (QID) | INTRAVENOUS | Status: AC
  Administered 2020-02-11 (×2): 2 g via INTRAVENOUS
  Filled 2020-02-11 (×2): qty 100

## 2020-02-11 MED ORDER — BUPIVACAINE HCL (PF) 0.25 % IJ SOLN
INTRAMUSCULAR | Status: AC
  Filled 2020-02-11: qty 30

## 2020-02-11 MED ORDER — DOCUSATE SODIUM 100 MG PO CAPS
100.0000 mg | ORAL_CAPSULE | Freq: Two times a day (BID) | ORAL | Status: DC
  Administered 2020-02-11 – 2020-02-12 (×2): 100 mg via ORAL
  Filled 2020-02-11 (×2): qty 1

## 2020-02-11 MED ORDER — MAGNESIUM CITRATE PO SOLN: 1.0000 | Freq: Once | ORAL | Status: DC | PRN

## 2020-02-11 MED ORDER — SENNA 8.6 MG PO TABS: 1.0000 | ORAL_TABLET | Freq: Every day | ORAL | Status: DC | PRN

## 2020-02-11 MED ORDER — MEPERIDINE HCL 50 MG/ML IJ SOLN: 6.2500 mg | INTRAMUSCULAR | Status: DC | PRN

## 2020-02-11 MED ORDER — LACTATED RINGERS IV SOLN: INTRAVENOUS | Status: DC

## 2020-02-11 MED ORDER — KETOROLAC TROMETHAMINE 30 MG/ML IJ SOLN
INTRAMUSCULAR | Status: AC
  Filled 2020-02-11: qty 1

## 2020-02-11 MED ORDER — SODIUM CHLORIDE 0.9 % IR SOLN
Status: DC | PRN
  Administered 2020-02-11: 1000 mL

## 2020-02-11 MED ORDER — CHLORHEXIDINE GLUCONATE 0.12 % MT SOLN
15.0000 mL | Freq: Once | OROMUCOSAL | Status: AC
  Administered 2020-02-11: 07:00:00 15 mL via OROMUCOSAL

## 2020-02-11 MED ORDER — DEXAMETHASONE SODIUM PHOSPHATE 10 MG/ML IJ SOLN
10.0000 mg | Freq: Once | INTRAMUSCULAR | Status: AC
  Administered 2020-02-11: 10:00:00 6 mg via INTRAVENOUS

## 2020-02-11 MED ORDER — PHENOL 1.4 % MT LIQD: 1.0000 | OROMUCOSAL | Status: DC | PRN

## 2020-02-11 MED ORDER — ASPIRIN 81 MG PO CHEW
81.0000 mg | CHEWABLE_TABLET | Freq: Two times a day (BID) | ORAL | Status: DC
  Administered 2020-02-11 – 2020-02-12 (×2): 81 mg via ORAL
  Filled 2020-02-11 (×2): qty 1

## 2020-02-11 MED ORDER — SODIUM CHLORIDE 0.9 % IV SOLN: INTRAVENOUS | Status: DC

## 2020-02-11 MED ORDER — ROPIVACAINE HCL 7.5 MG/ML IJ SOLN
INTRAMUSCULAR | Status: DC | PRN
  Administered 2020-02-11: 20 mL via PERINEURAL

## 2020-02-11 MED ORDER — DEXAMETHASONE SODIUM PHOSPHATE 10 MG/ML IJ SOLN
10.0000 mg | Freq: Once | INTRAMUSCULAR | Status: AC
  Administered 2020-02-12: 10:00:00 10 mg via INTRAVENOUS
  Filled 2020-02-11: qty 1

## 2020-02-11 MED ORDER — ALUM & MAG HYDROXIDE-SIMETH 200-200-20 MG/5ML PO SUSP: 15.0000 mL | ORAL | Status: DC | PRN

## 2020-02-11 MED ORDER — MORPHINE SULFATE (PF) 2 MG/ML IV SOLN: 0.5000 mg | INTRAVENOUS | Status: DC | PRN

## 2020-02-11 MED ORDER — ONDANSETRON HCL 4 MG/2ML IJ SOLN
INTRAMUSCULAR | Status: AC
  Filled 2020-02-11: qty 2

## 2020-02-11 MED ORDER — TRANEXAMIC ACID-NACL 1000-0.7 MG/100ML-% IV SOLN
1000.0000 mg | Freq: Once | INTRAVENOUS | Status: AC
  Administered 2020-02-11: 15:00:00 1000 mg via INTRAVENOUS
  Filled 2020-02-11: qty 100

## 2020-02-11 MED ORDER — BUPIVACAINE IN DEXTROSE 0.75-8.25 % IT SOLN
INTRATHECAL | Status: DC | PRN
  Administered 2020-02-11: 2 mL via INTRATHECAL

## 2020-02-11 MED ORDER — HYDROCODONE-ACETAMINOPHEN 7.5-325 MG PO TABS: 1.0000 | ORAL_TABLET | ORAL | Status: DC | PRN

## 2020-02-11 MED ORDER — STERILE WATER FOR IRRIGATION IR SOLN
Status: DC | PRN
  Administered 2020-02-11: 2000 mL

## 2020-02-11 MED ORDER — METFORMIN HCL 500 MG PO TABS
500.0000 mg | ORAL_TABLET | Freq: Every day | ORAL | Status: DC
  Administered 2020-02-11: 17:00:00 500 mg via ORAL
  Filled 2020-02-11: qty 1

## 2020-02-11 MED ORDER — SODIUM CHLORIDE 0.9 % IR SOLN
Status: DC | PRN
Start: 2020-02-11 — End: 2020-02-11
  Administered 2020-02-11: 1000 mL

## 2020-02-11 MED ORDER — BUPIVACAINE-EPINEPHRINE (PF) 0.25% -1:200000 IJ SOLN
INTRAMUSCULAR | Status: DC | PRN
  Administered 2020-02-11: 30 mL

## 2020-02-11 SURGICAL SUPPLY — 61 items
ADH SKN CLS APL DERMABOND .7 (GAUZE/BANDAGES/DRESSINGS) ×1
ATTUNE MED ANAT PAT 35 KNEE (Knees) ×2 IMPLANT
ATTUNE MED ANAT PAT 35MM KNEE (Knees) ×1 IMPLANT
ATTUNE PSFEM RTSZ4 NARCEM KNEE (Femur) ×3 IMPLANT
ATTUNE PSRP INSR SZ4 5 KNEE (Insert) ×2 IMPLANT
ATTUNE PSRP INSR SZ4 5MM KNEE (Insert) ×1 IMPLANT
BAG SPEC THK2 15X12 ZIP CLS (MISCELLANEOUS)
BAG ZIPLOCK 12X15 (MISCELLANEOUS) IMPLANT
BASEPLATE TIBIAL ROTATING SZ 4 (Knees) ×3 IMPLANT
BLADE SAW SGTL 11.0X1.19X90.0M (BLADE) ×3 IMPLANT
BLADE SAW SGTL 13.0X1.19X90.0M (BLADE) ×3 IMPLANT
BLADE SURG SZ10 CARB STEEL (BLADE) ×6 IMPLANT
BNDG ELASTIC 6X5.8 VLCR STR LF (GAUZE/BANDAGES/DRESSINGS) ×3 IMPLANT
BOWL SMART MIX CTS (DISPOSABLE) ×3 IMPLANT
BSPLAT TIB 4 CMNT ROT PLAT STR (Knees) ×1 IMPLANT
CEMENT HV SMART SET (Cement) ×6 IMPLANT
COVER WAND RF STERILE (DRAPES) IMPLANT
CUFF TOURN SGL QUICK 34 (TOURNIQUET CUFF) ×3
CUFF TRNQT CYL 34X4.125X (TOURNIQUET CUFF) ×1 IMPLANT
DECANTER SPIKE VIAL GLASS SM (MISCELLANEOUS) ×6 IMPLANT
DERMABOND ADVANCED (GAUZE/BANDAGES/DRESSINGS) ×2
DERMABOND ADVANCED .7 DNX12 (GAUZE/BANDAGES/DRESSINGS) ×1 IMPLANT
DRAPE U-SHAPE 47X51 STRL (DRAPES) ×3 IMPLANT
DRESSING AQUACEL AG SP 3.5X10 (GAUZE/BANDAGES/DRESSINGS) ×1 IMPLANT
DRSG AQUACEL AG SP 3.5X10 (GAUZE/BANDAGES/DRESSINGS) ×3
DURAPREP 26ML APPLICATOR (WOUND CARE) ×6 IMPLANT
ELECT REM PT RETURN 15FT ADLT (MISCELLANEOUS) ×3 IMPLANT
GLOVE BIOGEL PI IND STRL 7.5 (GLOVE) ×1 IMPLANT
GLOVE BIOGEL PI IND STRL 8.5 (GLOVE) IMPLANT
GLOVE BIOGEL PI INDICATOR 7.5 (GLOVE) ×2
GLOVE BIOGEL PI INDICATOR 8.5 (GLOVE)
GLOVE ECLIPSE 8.0 STRL XLNG CF (GLOVE) ×3 IMPLANT
GLOVE ORTHO TXT STRL SZ7.5 (GLOVE) ×3 IMPLANT
GLOVE SURG ENC MOIS LTX SZ6 (GLOVE) IMPLANT
GLOVE SURG UNDER POLY LF SZ6.5 (GLOVE) IMPLANT
GOWN STRL REUS W/TWL 2XL LVL3 (GOWN DISPOSABLE) IMPLANT
GOWN STRL REUS W/TWL LRG LVL3 (GOWN DISPOSABLE) ×3 IMPLANT
HANDPIECE INTERPULSE COAX TIP (DISPOSABLE) ×3
HOLDER FOLEY CATH W/STRAP (MISCELLANEOUS) IMPLANT
KIT TURNOVER KIT A (KITS) ×3 IMPLANT
MANIFOLD NEPTUNE II (INSTRUMENTS) ×3 IMPLANT
NDL SAFETY ECLIPSE 18X1.5 (NEEDLE) IMPLANT
NEEDLE HYPO 18GX1.5 SHARP (NEEDLE)
NS IRRIG 1000ML POUR BTL (IV SOLUTION) ×3 IMPLANT
PACK TOTAL KNEE CUSTOM (KITS) ×3 IMPLANT
PENCIL SMOKE EVACUATOR (MISCELLANEOUS) IMPLANT
PIN DRILL FIX HALF THREAD (BIT) ×3 IMPLANT
PIN FIX SIGMA LCS THRD HI (PIN) ×3 IMPLANT
PROTECTOR NERVE ULNAR (MISCELLANEOUS) ×3 IMPLANT
SET HNDPC FAN SPRY TIP SCT (DISPOSABLE) ×1 IMPLANT
SET PAD KNEE POSITIONER (MISCELLANEOUS) ×3 IMPLANT
SUT MNCRL AB 4-0 PS2 18 (SUTURE) ×3 IMPLANT
SUT STRATAFIX PDS+ 0 24IN (SUTURE) ×3 IMPLANT
SUT VIC AB 1 CT1 36 (SUTURE) ×3 IMPLANT
SUT VIC AB 2-0 CT1 27 (SUTURE) ×9
SUT VIC AB 2-0 CT1 TAPERPNT 27 (SUTURE) ×3 IMPLANT
SYR 3ML LL SCALE MARK (SYRINGE) ×3 IMPLANT
TRAY FOL W/BAG SLVR 16FR STRL (SET/KITS/TRAYS/PACK) ×1 IMPLANT
TRAY FOLEY W/BAG SLVR 16FR LF (SET/KITS/TRAYS/PACK) ×3
WATER STERILE IRR 1000ML POUR (IV SOLUTION) ×6 IMPLANT
WRAP KNEE MAXI GEL POST OP (GAUZE/BANDAGES/DRESSINGS) ×3 IMPLANT

## 2020-02-11 NOTE — Op Note (Signed)
NAME:  Destiny Shaw RECORD NO.:  [id number]                             FACILITY:  [facility]      PHYSICIAN:  Pietro Cassis. Alvan Dame, M.D.  DATE OF BIRTH:  February 25, 1951      DATE OF PROCEDURE:  02/11/2020                                     OPERATIVE REPORT         PREOPERATIVE DIAGNOSIS:  Right knee osteoarthritis.      POSTOPERATIVE DIAGNOSIS:  Right knee osteoarthritis.      FINDINGS:  The patient was noted to have complete loss of cartilage and   bone-on-bone arthritis with associated osteophytes in the medial and patellofemoral compartments of   the knee with a significant synovitis and associated effusion.  The patient had failed months of conservative treatment including medications, injection therapy, activity modification.     PROCEDURE:  Right total knee replacement.      COMPONENTS USED:  DePuy Attune rotating platform posterior stabilized knee   system, a size 4N femur, 4 tibia, size 5 mm PS AOX insert, and 35 anatomic patellar   button.      SURGEON:  Pietro Cassis. Alvan Dame, M.D.      ASSISTANT:  Griffith Citron, PA-C.      ANESTHESIA:  Regional and Spinal.      SPECIMENS:  None.      COMPLICATION:  None.      DRAINS:  None.  EBL: <100cc      TOURNIQUET TIME: 36 min at 200 mmHg     The patient was stable to the recovery room.      INDICATION FOR PROCEDURE:  Destiny Shaw is a 69 y.o. female patient of   mine.  The patient had been seen, evaluated, and treated for months conservatively in the   office with medication, activity modification, and injections.  The patient had   radiographic changes of bone-on-bone arthritis with endplate sclerosis and osteophytes noted.  Based on the radiographic changes and failed conservative measures, the patient   decided to proceed with definitive treatment, total knee replacement.  Risks of infection, DVT, component failure, need for revision surgery, neurovascular injury were reviewed in the office setting.  The  postop course was reviewed stressing the efforts to maximize post-operative satisfaction and function.  Consent was obtained for benefit of pain   relief.      PROCEDURE IN DETAIL:  The patient was brought to the operative theater.   Once adequate anesthesia, preoperative antibiotics, 2 gm of Ancef,1 gm of Tranexamic Acid, and 10 mg of Decadron administered, the patient was positioned supine with a right thigh tourniquet placed.  The  right lower extremity was prepped and draped in sterile fashion.  A time-   out was performed identifying the patient, planned procedure, and the appropriate extremity.      The right lower extremity was placed in the [facility] leg holder.  The leg was   exsanguinated, tourniquet elevated to 250 mmHg.  A midline incision was   made followed by median parapatellar arthrotomy.  Following initial   exposure, attention was first directed  to the patella.  Precut   measurement was noted to be 22 mm.  I resected down to 14 mm and used a   35 anatomic patellar button to restore patellar height as well as cover the cut surface.      The lug holes were drilled and a metal shim was placed to protect the   patella from retractors and saw blade during the procedure.      At this point, attention was now directed to the femur.  The femoral   canal was opened with a drill, irrigated to try to prevent fat emboli.  An   intramedullary rod was passed at 3 degrees valgus, 9 mm of bone was   resected off the distal femur.  Following this resection, the tibia was   subluxated anteriorly.  Using the extramedullary guide, 2 mm of bone was resected off   the proximal medial tibia.  We confirmed the gap would be   stable medially and laterally with a size 5 spacer block as well as confirmed that the tibial cut was perpendicular in the coronal plane, checking with an alignment rod.      Once this was done, I sized the femur to be a size 4 in the anterior-   posterior dimension, chose a  narrow component based on medial and   lateral dimension.  The size 4 rotation block was then pinned in   position anterior referenced using the C-clamp to set rotation.  The   anterior, posterior, and  chamfer cuts were made without difficulty nor   notching making certain that I was along the anterior cortex to help   with flexion gap stability.      The final box cut was made off the lateral aspect of distal femur.      At this point, the tibia was sized to be a size 4.  The size 4 tray was   then pinned in position through the medial third of the tubercle,   drilled, and keel punched.  Trial reduction was now carried with a 4 femur,  4 tibia, a size 5 mm PS insert, and the 35 anatomic patella botton.  The knee was brought to full extension with good flexion stability with the patella   tracking through the trochlea without application of pressure.  Given   all these findings the trial components removed.  Final components were   opened and cement was mixed.  The knee was irrigated with normal saline solution and pulse lavage.  The synovial lining was   then injected with 30 cc of 0.25% Marcaine with epinephrine, 1 cc of Toradol and 30 cc of NS for a total of 61 cc.     Final implants were then cemented onto cleaned and dried cut surfaces of bone with the knee brought to extension with a size 5 mm PS trial insert.      Once the cement had fully cured, excess cement was removed   throughout the knee.  I confirmed that I was satisfied with the range of   motion and stability, and the final size 5 mm PS AOX insert was chosen.  It was   placed into the knee.      The tourniquet had been let down at 36 minutes.  No significant   hemostasis was required.  The extensor mechanism was then reapproximated using #1 Vicryl and #1 Stratafix sutures with the knee   in flexion.  The   remaining  wound was closed with 2-0 Vicryl and running 4-0 Monocryl.   The knee was cleaned, dried, dressed  sterilely using Dermabond and   Aquacel dressing.  The patient was then   brought to recovery room in stable condition, tolerating the procedure   well.   Please note that Physician Assistant, Griffith Citron, PA-C was present for the entirety of the case, and was utilized for pre-operative positioning, peri-operative retractor management, general facilitation of the procedure and for primary wound closure at the end of the case.              Pietro Cassis Alvan Dame, M.D.    02/11/2020 10:06 AM

## 2020-02-11 NOTE — Transfer of Care (Signed)
Immediate Anesthesia Transfer of Care Note  Patient: Destiny Shaw  Procedure(s) Performed: TOTAL KNEE ARTHROPLASTY (Right Knee)  Patient Location: PACU  Anesthesia Type:MAC with regional and spinal  Level of Consciousness: awake, alert  and patient cooperative  Airway & Oxygen Therapy: Patient Spontanous Breathing and Patient connected to face mask oxygen  Post-op Assessment: Report given to RN and Post -op Vital signs reviewed and stable  Post vital signs: Reviewed and stable  Last Vitals:  Vitals Value Taken Time  BP 139/80 02/11/20 1145  Temp    Pulse 71 02/11/20 1149  Resp 15 02/11/20 1149  SpO2 100 % 02/11/20 1149  Vitals shown include unvalidated device data.  Last Pain:  Vitals:   02/11/20 0709  TempSrc:   PainSc: 6       Patients Stated Pain Goal: 4 (32/12/24 8250)  Complications: No complications documented.

## 2020-02-11 NOTE — Discharge Instructions (Signed)

## 2020-02-11 NOTE — Evaluation (Signed)
Physical Therapy Evaluation Patient Details Name: Destiny Shaw MRN: [id number] DOB: Jul 07, 1950 Today's Date: 02/11/2020   History of Present Illness  Patient is 69 y.o. female s/p Rt TKA on 02/11/20 with PMH significant for HTN, HLD, GERD, DM, OA, depression, anxiety, anemia, Lt TKA in 2018.    Clinical Impression  Destiny Shaw is a 69 y.o. female POD 0 s/p Rt TKA. Patient reports modified independence with mobility at baseline. Patient is now limited by functional impairments (see PT problem list below) and requires min assist for bed mobility. Patient was limited by knee pain and c/o of tightness in throat and difficulty breathing, however VSS with HR in 60s'-70's and SpO2 100% on 2L/min. Pt unable to progress OOB due to knee pain and tightness in throat. Deep breathing exercises seemed to improve pt's throat tightness and RN notified. Patient instructed in exercise to facilitate ROM and circulation. Patient will benefit from continued skilled PT interventions to address impairments and progress towards PLOF. Acute PT will follow to progress mobility and stair training in preparation for safe discharge home.     Follow Up Recommendations Follow surgeon's recommendation for DC plan and follow-up therapies    Equipment Recommendations  3in1 (PT)    Recommendations for Other Services       Precautions / Restrictions Precautions Precautions: Fall Restrictions Weight Bearing Restrictions: No Other Position/Activity Restrictions: WBAT      Mobility  Bed Mobility Overal bed mobility: Needs Assistance Bed Mobility: Supine to Sit;Sit to Supine     Supine to sit: Min assist;HOB elevated Sit to supine: Min assist;HOB elevated   General bed mobility comments: Cues for use of bed rail to raise trunk and bring LE's off EOB. Assist needed to bring Rt LE off EOB and to raise bil LE's onto bed.    Transfers                    Ambulation/Gait                Stairs             Wheelchair Mobility    Modified Rankin (Stroke Patients Only)       Balance Overall balance assessment: Needs assistance Sitting-balance support: Feet supported;Single extremity supported Sitting balance-Leahy Scale: Fair                                       Pertinent Vitals/Pain Pain Assessment: 0-10 Pain Score: 0-No pain Pain Location: 6 Pain Descriptors / Indicators: Aching;Discomfort Pain Intervention(s): Limited activity within patient's tolerance;Monitored during session;Repositioned    Home Living Family/patient expects to be discharged to:: Private residence Living Arrangements: Spouse/significant other Available Help at Discharge: Family Type of Home: House Home Access: Stairs to enter Entrance Stairs-Rails: Research scientist (medical) of Steps: 5 at garage with rail on Rt, 3+1 with no rails at front [place]: One level Home Equipment: Environmental consultant - 2 wheels;Cane - single point;Grab bars - tub/shower      Prior Function Level of Independence: Independent with assistive device(s)         Comments: pt has been using RW and SPC for mobility due to knee pain     Hand Dominance   Dominant Hand: Right    Extremity/Trunk Assessment   Upper Extremity Assessment Upper Extremity Assessment: Overall WFL for tasks assessed    Lower Extremity Assessment Lower Extremity Assessment:  RLE deficits/detail RLE Deficits / Details: pt unable to complete SLR due to pain, good quad set, 2/5 or better RLE: Unable to fully assess due to pain RLE Sensation: WNL RLE Coordination: WNL    Cervical / Trunk Assessment Cervical / Trunk Assessment: Normal  Communication   Communication: No difficulties  Cognition Arousal/Alertness: Awake/alert Behavior During Therapy: WFL for tasks assessed/performed Overall Cognitive Status: Within Functional Limits for tasks assessed                                        General  Comments General comments (skin integrity, edema, etc.): pt c/o tightness in her chest/throat that felt like she couldn't breathe. episodes lasted ~5-15 seconds and then subsided. epsiodes seemed to improve with deep breathing for relaxation. RN notified. Total of ~5 episodes occured.    Exercises Total Joint Exercises Ankle Circles/Pumps: AROM;Both;20 reps;Seated Quad Sets: AROM;Right;5 reps;Supine   Assessment/Plan    PT Assessment Patient needs continued PT services  PT Problem List Decreased strength;Decreased range of motion;Decreased activity tolerance;Decreased balance;Decreased mobility;Decreased knowledge of use of DME;Decreased knowledge of precautions       PT Treatment Interventions DME instruction;Gait training;Stair training;Functional mobility training;Therapeutic activities;Therapeutic exercise;Balance training;Patient/family education    PT Goals (Current goals can be found in the Care Plan section)  Acute Rehab PT Goals Patient Stated Goal: stop feeling choking sensation and knee to feel better PT Goal Formulation: With patient Time For Goal Achievement: 02/18/20 Potential to Achieve Goals: Good    Frequency 7X/week   Barriers to discharge        Co-evaluation               AM-PAC PT "6 Clicks" Mobility  Outcome Measure Help needed turning from your back to your side while in a flat bed without using bedrails?: A Little Help needed moving from lying on your back to sitting on the side of a flat bed without using bedrails?: A Little Help needed moving to and from a bed to a chair (including a wheelchair)?: A Lot Help needed standing up from a chair using your arms (e.g., wheelchair or bedside chair)?: A Lot Help needed to walk in hospital room?: A Lot Help needed climbing 3-5 steps with a railing? : A Lot 6 Click Score: 14    End of Session   Activity Tolerance: Patient tolerated treatment well Patient left: in bed;with call bell/phone within  reach;with family/visitor present;with bed alarm set Nurse Communication: Mobility status PT Visit Diagnosis: Muscle weakness (generalized) (M62.81);Difficulty in walking, not elsewhere classified (R26.2)    Time: 2229-7989 PT Time Calculation (min) (ACUTE ONLY): 32 min   Charges:   PT Evaluation $PT Eval Low Complexity: 1 Low PT Treatments $Therapeutic Activity: 8-22 mins        Verner Mould, DPT Acute Rehabilitation Services Office [phone] Pager [phone]    Jacques Navy 02/11/2020, 3:58 PM

## 2020-02-11 NOTE — Anesthesia Postprocedure Evaluation (Signed)
Anesthesia Post Note  Patient: MAVEN ROSANDER  Procedure(s) Performed: TOTAL KNEE ARTHROPLASTY (Right Knee)     Patient location during evaluation: PACU Anesthesia Type: Spinal Level of consciousness: oriented and awake and alert Pain management: pain level controlled Vital Signs Assessment: post-procedure vital signs reviewed and stable Respiratory status: spontaneous breathing, respiratory function stable and patient connected to nasal cannula oxygen Cardiovascular status: blood pressure returned to baseline and stable Postop Assessment: no headache, no backache and no apparent nausea or vomiting Anesthetic complications: no   No complications documented.  Last Vitals:  Vitals:   02/11/20 1533 02/11/20 1631  BP: (!) 180/79 (!) 151/62  Pulse: 71 71  Resp: 18 16  Temp: 36.8 C 36.8 C  SpO2: 99% 97%    Last Pain:  Vitals:   02/11/20 1631  TempSrc: Oral  PainSc:                  Damaya Channing DAVID

## 2020-02-11 NOTE — Anesthesia Procedure Notes (Signed)
Spinal  Patient location during procedure: OR Start time: 02/11/2020 9:52 AM End time: 02/11/2020 9:55 AM Staffing Performed: anesthesiologist  Anesthesiologist: Lillia Abed, MD Preanesthetic Checklist Completed: patient identified, IV checked, risks and benefits discussed, surgical consent, monitors and equipment checked, pre-op evaluation and timeout performed Spinal Block Patient position: sitting Prep: DuraPrep Patient monitoring: blood pressure, continuous pulse ox, cardiac monitor and heart rate Approach: right paramedian Location: L3-4 Injection technique: single-shot Needle Needle type: Pencan  Needle gauge: 24 G Needle length: 9 cm Needle insertion depth: 7 cm

## 2020-02-11 NOTE — Anesthesia Procedure Notes (Signed)
Procedure Name: MAC Date/Time: 02/11/2020 9:50 AM Performed by: West Pugh, CRNA Pre-anesthesia Checklist: Patient identified, Emergency Drugs available, Suction available, Patient being monitored and Timeout performed Patient Re-evaluated:Patient Re-evaluated prior to induction Oxygen Delivery Method: Simple face mask Preoxygenation: Pre-oxygenation with 100% oxygen Induction Type: IV induction Placement Confirmation: positive ETCO2 Dental Injury: Teeth and Oropharynx as per pre-operative assessment

## 2020-02-11 NOTE — Anesthesia Procedure Notes (Addendum)
Anesthesia Regional Block: Adductor canal block   Pre-Anesthetic Checklist: ,, timeout performed, Correct Patient, Correct Site, Correct Laterality, Correct Procedure, Correct Position, site marked, Risks and benefits discussed,  Surgical consent,  Pre-op evaluation,  At surgeon's request and post-op pain management  Laterality: Right  Prep: chloraprep       Needles:  Injection technique: Single-shot  Needle Type: Echogenic Stimulator Needle     Needle Length: 9cm  Needle Gauge: 21     Additional Needles:   Narrative:  Start time: 02/11/2020 9:00 AM End time: 02/11/2020 9:10 AM Injection made incrementally with aspirations every 5 mL.  Performed by: Personally  Anesthesiologist: Lillia Abed, MD  Additional Notes: Monitors applied. Patient sedated. Sterile prep and drape,hand hygiene and sterile gloves were used. Relevant anatomy identified.Needle position confirmed.Local anesthetic injected incrementally after negative aspiration. Local anesthetic spread visualized around nerve(s). Vascular puncture avoided. No complications. Image printed for medical record.The patient tolerated the procedure well.    Lillia Abed MD

## 2020-02-11 NOTE — Progress Notes (Signed)
AssistedDr. Ossey with right, ultrasound guided, adductor canal block. Side rails up, monitors on throughout procedure. See vital signs in flow sheet. Tolerated Procedure well.  

## 2020-02-11 NOTE — Interval H&P Note (Signed)
History and Physical Interval Note:  02/11/2020 7:07 AM  Destiny Shaw  has presented today for surgery, with the diagnosis of Right knee osteoarthritis.  The various methods of treatment have been discussed with the patient and family. After consideration of risks, benefits and other options for treatment, the patient has consented to  Procedure(s): TOTAL KNEE ARTHROPLASTY (Right) as a surgical intervention.  The patient's history has been reviewed, patient examined, no change in status, stable for surgery.  I have reviewed the patient's chart and labs.  Questions were answered to the patient's satisfaction.     Mauri Pole

## 2020-02-11 NOTE — Anesthesia Preprocedure Evaluation (Signed)
Anesthesia Evaluation  Patient identified by MRN, date of birth, ID band Patient awake    Reviewed: Allergy & Precautions, NPO status , Patient's Chart, lab work & pertinent test results  Airway Mallampati: I  TM Distance: >3 FB Neck ROM: Full    Dental   Pulmonary sleep apnea ,    Pulmonary exam normal        Cardiovascular hypertension, Pt. on medications Normal cardiovascular exam     Neuro/Psych Anxiety Depression Dementia    GI/Hepatic GERD  Medicated and Controlled,  Endo/Other  diabetes, Type 2, Oral Hypoglycemic Agents  Renal/GU      Musculoskeletal   Abdominal   Peds  Hematology   Anesthesia Other Findings   Reproductive/Obstetrics                             Anesthesia Physical Anesthesia Plan  ASA: III  Anesthesia Plan: Spinal   Post-op Pain Management:  Regional for Post-op pain   Induction: Intravenous  PONV Risk Score and Plan: 2 and Ondansetron and Treatment may vary due to age or medical condition  Airway Management Planned: Nasal Cannula  Additional Equipment:   Intra-op Plan:   Post-operative Plan:   Informed Consent: I have reviewed the patients History and Physical, chart, labs and discussed the procedure including the risks, benefits and alternatives for the proposed anesthesia with the patient or authorized representative who has indicated his/her understanding and acceptance.       Plan Discussed with: CRNA and Surgeon  Anesthesia Plan Comments:         Anesthesia Quick Evaluation

## 2020-02-12 ENCOUNTER — Encounter (HOSPITAL_COMMUNITY): Payer: Self-pay | Admitting: Orthopedic Surgery

## 2020-02-12 DIAGNOSIS — M1711 Unilateral primary osteoarthritis, right knee: Secondary | ICD-10-CM | POA: Diagnosis not present

## 2020-02-12 LAB — BASIC METABOLIC PANEL
Anion gap: 10 (ref 5–15)
BUN: 17 mg/dL (ref 8–23)
CO2: 24 mmol/L (ref 22–32)
Calcium: 8.7 mg/dL — ABNORMAL LOW (ref 8.9–10.3)
Chloride: 108 mmol/L (ref 98–111)
Creatinine, Ser: 0.74 mg/dL (ref 0.44–1.00)
GFR, Estimated: >60 mL/min (ref >60)
Glucose, Bld: 137 mg/dL — ABNORMAL HIGH (ref 70–99)
Potassium: 4.1 mmol/L (ref 3.5–5.1)
Sodium: 142 mmol/L (ref 135–145)

## 2020-02-12 LAB — CBC
HCT: 32.4 % — ABNORMAL LOW (ref 36.0–46.0)
Hemoglobin: 10.2 g/dL — ABNORMAL LOW (ref 12.0–15.0)
MCH: 28 pg (ref 26.0–34.0)
MCHC: 31.5 g/dL (ref 30.0–36.0)
MCV: 89 fL (ref 80.0–100.0)
Platelets: 184 10*3/uL (ref 150–400)
RBC: 3.64 MIL/uL — ABNORMAL LOW (ref 3.87–5.11)
RDW: 13.9 % (ref 11.5–15.5)
WBC: 9.1 10*3/uL (ref 4.0–10.5)
nRBC: 0 % (ref 0.0–0.2)

## 2020-02-12 LAB — GLUCOSE, CAPILLARY
Glucose-Capillary: 116 mg/dL — ABNORMAL HIGH (ref 70–99)
Glucose-Capillary: 174 mg/dL — ABNORMAL HIGH (ref 70–99)

## 2020-02-12 MED ORDER — HYDROCODONE-ACETAMINOPHEN 5-325 MG PO TABS: 1.0000 | ORAL_TABLET | Freq: Four times a day (QID) | ORAL | 0 refills | Status: AC | PRN

## 2020-02-12 MED ORDER — POLYETHYLENE GLYCOL 3350 17 G PO PACK: 17.0000 g | PACK | Freq: Two times a day (BID) | ORAL | 0 refills | Status: DC

## 2020-02-12 MED ORDER — LORAZEPAM 0.5 MG PO TABS
0.5000 mg | ORAL_TABLET | Freq: Two times a day (BID) | ORAL | Status: DC | PRN
  Administered 2020-02-12: 12:00:00 0.5 mg via ORAL
  Filled 2020-02-12: qty 1

## 2020-02-12 MED ORDER — DOCUSATE SODIUM 100 MG PO CAPS: 100.0000 mg | ORAL_CAPSULE | Freq: Two times a day (BID) | ORAL | 0 refills | Status: DC

## 2020-02-12 MED ORDER — FERROUS SULFATE 325 (65 FE) MG PO TABS: 325.0000 mg | ORAL_TABLET | Freq: Three times a day (TID) | ORAL | 0 refills | Status: DC

## 2020-02-12 MED ORDER — ASPIRIN 81 MG PO CHEW: 81.0000 mg | CHEWABLE_TABLET | Freq: Two times a day (BID) | ORAL | 0 refills | Status: AC

## 2020-02-12 MED ORDER — METHOCARBAMOL 500 MG PO TABS: 500.0000 mg | ORAL_TABLET | Freq: Four times a day (QID) | ORAL | 0 refills | Status: DC | PRN

## 2020-02-12 NOTE — Progress Notes (Signed)
     Subjective: 1 Day Post-Op Procedure(s) (LRB): TOTAL KNEE ARTHROPLASTY (Right)   Patient reports pain as mild, pain controlled. No reported events throughout the night.  Discussed the procedure and expectations moving forward.  Ready to be discharged home, if they do well with PT.  Follow up in the clinic in 2 weeks.  Knows to call with any questions or concerns.  .   Patient's anticipated LOS is less than 2 midnights, meeting these requirements: - Lives within 1 hour of care - Has a competent adult at home to recover with post-op recover - NO history of  - Chronic pain requiring opiods  - Coronary Artery Disease  - Heart failure  - Heart attack  - Stroke  - DVT/VTE  - Cardiac arrhythmia  - Respiratory Failure/COPD  - Renal failure  - Anemia  - Advanced Liver disease       Objective:   VITALS:   Vitals:   02/12/20 0631 02/12/20 0938  BP: 137/62 126/65  Pulse: 70 81  Resp: 16 16  Temp: 98.2 F (36.8 C) 98.3 F (36.8 C)  SpO2: 98% 96%    Dorsiflexion/Plantar flexion intact Incision: dressing C/D/I No cellulitis present Compartment soft  LABS Recent Labs    02/12/20 0324  HGB 10.2*  HCT 32.4*  WBC 9.1  PLT 184    Recent Labs    02/12/20 0324  NA 142  K 4.1  BUN 17  CREATININE 0.74  GLUCOSE 137*     Assessment/Plan: 1 Day Post-Op Procedure(s) (LRB): TOTAL KNEE ARTHROPLASTY (Right) Foley cath d/c'ed Advance diet Up with therapy D/C IV fluids Discharge home Follow up in 2 weeks at [facility] Follow up with OLIN,Mayah Urquidi D in 2 weeks.  Contact information:  [facility] [address], [address] [place] [phone]    Obese (BMI 30-39.9) Estimated body mass index is 32.61 kg/m as calculated from the following:   Height as of this encounter: 5\' 4"  (1.626 m).   Weight as of this encounter: 86.2 kg. Patient also counseled that weight may inhibit the healing process Patient counseled that losing weight  will help with future health issues      Danae Orleans PA-C  [facility]  [place] [address], [place], [place] [address] Phone: [phone] [email or web address] Facebook  [employer]

## 2020-02-12 NOTE — Plan of Care (Signed)
Patient discharged home in stable condition 

## 2020-02-12 NOTE — Progress Notes (Signed)
Physical Therapy Treatment Patient Details Name: Destiny Shaw MRN: [id number] DOB: [birth year] Today's Date: 02/12/2020    History of Present Illness Patient is 69 y.o. female s/p Rt TKA on 02/11/20 with PMH significant for HTN, HLD, GERD, DM, OA, depression, anxiety, anemia, Lt TKA in 2018.    PT Comments    Pt with increased anxiousness, pain, and feeling that she is not doing well.  However, she had good quad activation, ability to perform exercises and transfer, and fair ROM.  Offered education and encouragement on good progress.   Provided relaxation techniques with transfers (cues to focus on breathing).  She did ambulate in hall with step to pattern with limited weight on R LE , limited by pain.   Will benefit from further PT to advance gait and stairs.    Follow Up Recommendations  Follow surgeon's recommendation for DC plan and follow-up therapies     Equipment Recommendations  3in1 (PT)    Recommendations for Other Services       Precautions / Restrictions Precautions Precautions: Fall Restrictions Weight Bearing Restrictions: No Other Position/Activity Restrictions: WBAT    Mobility  Bed Mobility Overal bed mobility: Needs Assistance Bed Mobility: Supine to Sit     Supine to sit: Min assist;HOB elevated     General bed mobility comments: Min A for R LE with cues for sequence and relaxation  Transfers Overall transfer level: Needs assistance Equipment used: Rolling walker (2 wheeled) Transfers: Sit to/from Stand Sit to Stand: Min guard         General transfer comment: cues for safe hand placement; increased time to rise  Ambulation/Gait Ambulation/Gait assistance: Min guard Gait Distance (Feet): 80 Feet Assistive device: Rolling walker (2 wheeled) Gait Pattern/deviations: Step-to pattern;Decreased stance time - right;Decreased weight shift to right;Decreased stride length Gait velocity: decreased   General Gait Details: limited weight on R LE due  to pain; educated on RW proximity and sequencing   Stairs Stairs:  (held due to pain, will attempt in pm)           Wheelchair Mobility    Modified Rankin (Stroke Patients Only)       Balance Overall balance assessment: Needs assistance Sitting-balance support: Feet supported;No upper extremity supported Sitting balance-Leahy Scale: Good     Standing balance support: Bilateral upper extremity supported Standing balance-Leahy Scale: Poor Standing balance comment: required RW                            Cognition Arousal/Alertness: Awake/alert Behavior During Therapy: Anxious Overall Cognitive Status: Within Functional Limits for tasks assessed                                 General Comments: Pt reports anxious over pain, feeling she is not doing well, not being able to control and plan, and that she had episodes of throat tightness yesterday that have resolved      Exercises Total Joint Exercises Ankle Circles/Pumps: AROM;Both;20 reps;Supine Quad Sets: AROM;Right;Supine;10 reps Towel Squeeze: AROM;Both;10 reps;Supine Heel Slides: AAROM;Right;10 reps;Supine (only requiring min A for pain control) Hip ABduction/ADduction: AAROM;Right;10 reps;Supine (only requiring min A for pain control) Long Arc Quad: AAROM;Right;5 reps;Seated (only requiring min A for pain control) Knee Flexion: AAROM;5 reps;Seated;Right (only requiring min A for pain control) Goniometric ROM: R knee lacking 8 ext, 70 flex    General Comments General comments (skin integrity,  edema, etc.): Pt educated on exercises, no pivots, and resting with leg straight.  Also, offered frequent encouragement on good transfers, good quad activation, and good ROM.      Pertinent Vitals/Pain Pain Assessment: 0-10 Pain Score: 7  Pain Location: R knee Pain Descriptors / Indicators: Discomfort;Sore;Aching;Burning Pain Intervention(s): Limited activity within patient's tolerance;Monitored  during session;Premedicated before session;Repositioned;Ice applied;Relaxation    Home Living                      Prior Function            PT Goals (current goals can now be found in the care plan section) Acute Rehab PT Goals Patient Stated Goal: decrease pain and return home PT Goal Formulation: With patient Time For Goal Achievement: 02/18/20 Potential to Achieve Goals: Good Progress towards PT goals: Progressing toward goals    Frequency    7X/week      PT Plan Current plan remains appropriate    Co-evaluation              AM-PAC PT "6 Clicks" Mobility   Outcome Measure  Help needed turning from your back to your side while in a flat bed without using bedrails?: A Little Help needed moving from lying on your back to sitting on the side of a flat bed without using bedrails?: A Little Help needed moving to and from a bed to a chair (including a wheelchair)?: A Little Help needed standing up from a chair using your arms (e.g., wheelchair or bedside chair)?: A Little Help needed to walk in hospital room?: A Little Help needed climbing 3-5 steps with a railing? : A Lot 6 Click Score: 17    End of Session Equipment Utilized During Treatment: Gait belt Activity Tolerance: Patient limited by pain Patient left: with chair alarm set;in chair;with call bell/phone within reach Nurse Communication: Mobility status PT Visit Diagnosis: Muscle weakness (generalized) (M62.81);Difficulty in walking, not elsewhere classified (R26.2)     Time: 1110-1141 PT Time Calculation (min) (ACUTE ONLY): 31 min  Charges:  $Gait Training: 8-22 mins $Therapeutic Exercise: 8-22 mins                     Abran Richard, PT Acute Rehab Services Pager [phone] [facility] [place] 02/12/2020, 12:01 PM

## 2020-02-12 NOTE — Progress Notes (Signed)
Physical Therapy Treatment Patient Details Name: Destiny Shaw MRN: [id number] DOB: 12-Mar-1950 Today's Date: 02/12/2020    History of Present Illness Patient is 69 y.o. female s/p Rt TKA on 02/11/20 with PMH significant for HTN, HLD, GERD, DM, OA, depression, anxiety, anemia, Lt TKA in 2018.    PT Comments    Pt demonstrating gradual progress.  She was able to perform stairs in multiple ways and with spouse assisting providing cues.  Spent increased time educating on safety, stairs, HEP, and transfers.  Pt with improved pain this afternoon.  Pt has DME and support at home.  She continues to present with some anxiousness and some impulsivity this afternoon in regards to packing and getting ready to go.  Spouse and pt both report that this is her normal and did not feel that this impulsivity was related to medication.  Spouse aware and able to provide safety cues.     Follow Up Recommendations  Follow surgeon's recommendation for DC plan and follow-up therapies     Equipment Recommendations  3in1 (PT)    Recommendations for Other Services       Precautions / Restrictions Precautions Precautions: Fall Restrictions Other Position/Activity Restrictions: WBAT    Mobility  Bed Mobility Overal bed mobility: Needs Assistance Bed Mobility: Supine to Sit     Supine to sit: Supervision Sit to supine: Supervision   General bed mobility comments: educated on use of gait belt to assist R LE and was able to supine/sit with belt  Transfers Overall transfer level: Needs assistance Equipment used: Rolling walker (2 wheeled) Transfers: Sit to/from Stand Sit to Stand: Supervision         General transfer comment: Multiple times throughout session, demonstrated safe hand placment and improved time to rise.  At end of session, pt began trying to pack/dress, etc to go home.  She kept standing up without RW and starting to walk/reach to assist with packing.  Multiple cues to always have RW  when standing and to allow someone else to help her pack,etc.  Spouse was in room.  Spoke with pt and spouse about safety.  Pt and spouse confirmed that this is pt's normal.  She wants to do things herself and has difficulty waiting.  They did not feel this anxiousness/impulsiveness was due to any medications. Pt's spouse reports he will monitor and makes sure she has RW and will assist.  Ambulation/Gait Ambulation/Gait assistance: Supervision Gait Distance (Feet): 120 Feet Assistive device: Rolling walker (2 wheeled) Gait Pattern/deviations: Step-to pattern;Decreased weight shift to right;Decreased stance time - right Gait velocity: decreased   General Gait Details: Cues for RW proximity   Stairs Stairs: Yes Stairs assistance: Min guard Stair Management: One rail Right;No rails;With cane Number of Stairs: 5 (5x2) General stair comments: Initially pt reporting will go in back door with rail on R - performed 5 steps sideways with min guard and cues.  Pt's spouse arrived mid treatment and reports will not be able to go in back door due to would have to walk thru grass and cannot drive around to that area.  Pt will have to go up 3 steps with no rail.  Then performed 2 steps with cane on L and therapist on R.  THen performed 3 steps with cane on L and spouse on R providing cues.  Pt able to perform smoothly without LOB.  Provided written instructions on stair sequencing   Wheelchair Mobility    Modified Rankin (Stroke Patients Only)  Balance Overall balance assessment: Needs assistance Sitting-balance support: Feet supported;No upper extremity supported Sitting balance-Leahy Scale: Normal     Standing balance support: Bilateral upper extremity supported;No upper extremity supported Standing balance-Leahy Scale: Fair Standing balance comment: RW for gait , was able to static stand and reach without AD but was educated to use RW when standing                             Cognition Arousal/Alertness: Awake/alert Behavior During Therapy: Anxious Overall Cognitive Status: Within Functional Limits for tasks assessed                                 General Comments: Pt reports anxious over pain, feeling she is not doing well, not being able to control and plan, and that she had episodes of throat tightness yesterday that have resolved      Exercises Total Joint Exercises Ankle Circles/Pumps: AROM;Both;20 reps;Supine Quad Sets: AROM;Right;Supine;10 reps Towel Squeeze: AROM;Both;10 reps;Supine Heel Slides: AAROM;Right;10 reps;Supine (use of gait belt for AAROM) Hip ABduction/ADduction: AAROM;Right;10 reps;Supine (use of gait belt for AAROM) Long Arc Quad: Right;5 reps;Seated;AROM Knee Flexion: Seated;Right;AROM;10 reps Goniometric ROM: R knee lacking 8 ext, 75 flexion    General Comments General comments (skin integrity, edema, etc.): Pt educated on car transfers, no pivots, resting with leg straight, safe ice use and HEP.  Provided increased education on stair training.  Also, multiple reinforcement of safety including having walker with her at all times when standing and letting others do things for her would require her to let go of walker (ex: packing, cooking, fixing food).  See cognitive section.      Pertinent Vitals/Pain Pain Assessment: 0-10 Pain Score: 7  Pain Location: R knee Pain Descriptors / Indicators: Discomfort;Sore;Aching;Burning Pain Intervention(s): Limited activity within patient's tolerance;Monitored during session;Premedicated before session;Repositioned;Ice applied    Home Living                      Prior Function            PT Goals (current goals can now be found in the care plan section) Acute Rehab PT Goals Patient Stated Goal: decrease pain and return home PT Goal Formulation: With patient Time For Goal Achievement: 02/18/20 Potential to Achieve Goals: Good Progress towards PT goals:  Progressing toward goals    Frequency    7X/week      PT Plan Current plan remains appropriate    Co-evaluation              AM-PAC PT "6 Clicks" Mobility   Outcome Measure  Help needed turning from your back to your side while in a flat bed without using bedrails?: A Little Help needed moving from lying on your back to sitting on the side of a flat bed without using bedrails?: A Little Help needed moving to and from a bed to a chair (including a wheelchair)?: A Little Help needed standing up from a chair using your arms (e.g., wheelchair or bedside chair)?: A Little Help needed to walk in hospital room?: A Little Help needed climbing 3-5 steps with a railing? : A Little 6 Click Score: 18    End of Session Equipment Utilized During Treatment: Gait belt Activity Tolerance: Patient tolerated treatment well Patient left: in bed;with bed alarm set;with family/visitor present;with call bell/phone within reach Nurse Communication: Mobility  status PT Visit Diagnosis: Muscle weakness (generalized) (M62.81);Difficulty in walking, not elsewhere classified (R26.2)     Time: 3559-7416 PT Time Calculation (min) (ACUTE ONLY): 57 min  Charges:  $Gait Training: 23-37 mins $Therapeutic Exercise: 8-22 mins $Therapeutic Activity: 8-22 mins                     Destiny Shaw, PT Acute Rehab Services Pager [phone] [facility] [place] 02/12/2020, 3:30 PM

## 2020-02-12 NOTE — TOC Transition Note (Signed)
Transition of Care [facility]) - CM/SW Discharge Note   Patient Details  Name: Destiny Shaw MRN: [id number] Date of Birth: 1950/06/27  Transition of Care [facility]) CM/SW Contact:  Lennart Pall, LCSW Phone Number: 02/12/2020, 10:55 AM   Clinical Narrative:   Met with pt and confirmed need for DME (see below).  She is set up for OPPT at [facility].  No further TOC needs.    Final next level of care: OP Rehab Barriers to Discharge: No Barriers Identified   Patient Goals and CMS Choice Patient states their goals for this hospitalization and ongoing recovery are:: return home      Discharge Placement                       Discharge Plan and Services                DME Arranged: 3-N-1,Walker rolling DME Agency: [employer] (ordered via MD office)                  Social Determinants of Health (SDOH) Interventions     Readmission Risk Interventions No flowsheet data found.

## 2020-02-22 NOTE — Discharge Summary (Signed)
Patient ID: Destiny Shaw MRN: [id number] DOB/AGE: Feb 12, 1951 69 y.o.  Admit date: 02/11/2020 Discharge date: 02/22/2020  Admission Diagnoses:  Principal Problem:   Osteoarthritis of right knee Active Problems:   Obese   S/P total knee arthroplasty, right   Discharge Diagnoses:  Same  Past Medical History:  Diagnosis Date  . ADD (attention deficit disorder)   . Anemia   . Anginal pain ([place])    pt states has occas chest pain relates to indigestion; pt uses rest to relieve DR [place]-   . Anxiety   . Arthritis   . Concussion   . Depression   . Diabetes mellitus without complication ([place])    type 2   . Dizziness   . Fall   . GERD (gastroesophageal reflux disease)   . Headache   . History of urinary tract infection   . Hyperlipidemia   . Hypertension   . IBS (irritable bowel syndrome)   . Imbalance   . Numbness    right leg   . Numbness in both hands    comes and goes   . Pneumonia    last episode approx 1 year ago   . Sleep apnea    uses cpap not all the time   . Wears glasses     Surgeries: Procedure(s):  RIGHT TOTAL KNEE ARTHROPLASTY on 02/11/2020   Consultants: N/A  Discharged Condition: Improved  Hospital Course: Destiny Shaw is an 69 y.o. female who was admitted 02/11/2020 for operative treatment ofOsteoarthritis of right knee. Patient has severe unremitting pain that affects sleep, daily activities, and work/hobbies. After pre-op clearance the patient was taken to the operating room on 02/11/2020 and underwent  Procedure(s): RIGHT TOTAL KNEE ARTHROPLASTY.    Patient was given perioperative antibiotics:  Anti-infectives (From admission, onward)   Start     Dose/Rate Route Frequency Ordered Stop   02/11/20 1600  ceFAZolin (ANCEF) IVPB 2g/100 mL premix        2 g 200 mL/hr over 30 Minutes Intravenous Every 6 hours 02/11/20 1421 02/11/20 2112   02/11/20 0645  ceFAZolin (ANCEF) IVPB 2g/100 mL premix        2 g 200 mL/hr over 30  Minutes Intravenous On call to O.R. 02/11/20 0641 02/11/20 1026       Patient was given sequential compression devices, early ambulation, and chemoprophylaxis to prevent DVT.  Patient benefited maximally from hospital stay and there were no complications.    Recent vital signs: No data found.   Recent laboratory studies: No results for input(s): WBC, HGB, HCT, PLT, NA, K, CL, CO2, BUN, CREATININE, GLUCOSE, INR, CALCIUM in the last 72 hours.  Invalid input(s): PT, 2   Discharge Medications:   Allergies as of 02/12/2020      Reactions   Levaquin [levofloxacin] Other (See Comments)   unknown   Statins Other (See Comments)   Tessalon [benzonatate] Hives   Latex Rash      Medication List    STOP taking these medications   acetaminophen 500 MG tablet Commonly known as: TYLENOL   aspirin EC 81 MG tablet Replaced by: aspirin 81 MG chewable tablet   zolpidem 10 MG tablet Commonly known as: AMBIEN     TAKE these medications   aspirin 81 MG chewable tablet Commonly known as: Aspirin Childrens Chew 1 tablet (81 mg total) by mouth 2 (two) times daily. Take for 4 weeks, then resume regular dose. Replaces: aspirin EC 81 MG tablet  buPROPion 150 MG 12 hr tablet Commonly known as: WELLBUTRIN SR Take 150 mg by mouth 2 (two) times daily.   busPIRone 10 MG tablet Commonly known as: BUSPAR Take 10 mg by mouth 2 (two) times daily.   celecoxib 200 MG capsule Commonly known as: CELEBREX Take 200 mg by mouth daily.   cholecalciferol 25 MCG (1000 UNIT) tablet Commonly known as: VITAMIN D3 Take 1,000 Units by mouth daily.   docusate sodium 100 MG capsule Commonly known as: Colace Take 1 capsule (100 mg total) by mouth 2 (two) times daily.   ferrous sulfate 325 (65 FE) MG tablet Commonly known as: FerrouSul Take 1 tablet (325 mg total) by mouth 3 (three) times daily with meals for 14 days.   glipiZIDE 10 MG 24 hr tablet Commonly known as: GLUCOTROL XL Take 10 mg by mouth  daily with breakfast.   HYDROcodone-acetaminophen 5-325 MG tablet Commonly known as: Norco Take 1-2 tablets by mouth every 6 (six) hours as needed for moderate pain or severe pain.   lisinopril 10 MG tablet Commonly known as: ZESTRIL Take 10 mg by mouth daily.   Melatonin 10 MG Tabs Take 10 mg by mouth at bedtime.   metFORMIN 500 MG tablet Commonly known as: GLUCOPHAGE Take 500 mg by mouth every evening.   methocarbamol 500 MG tablet Commonly known as: Robaxin Take 1 tablet (500 mg total) by mouth every 6 (six) hours as needed for muscle spasms.   metoprolol succinate 25 MG 24 hr tablet Commonly known as: TOPROL-XL Take 25 mg by mouth daily.   pioglitazone 15 MG tablet Commonly known as: ACTOS Take 15 mg by mouth daily.   polyethylene glycol 17 g packet Commonly known as: MIRALAX / GLYCOLAX Take 17 g by mouth 2 (two) times daily.   pravastatin 20 MG tablet Commonly known as: PRAVACHOL Take 20 mg by mouth every evening.            Discharge Care Instructions  (From admission, onward)         Start     Ordered   02/12/20 0000  Change dressing       Comments: Maintain surgical dressing until follow up in the clinic. If the edges start to pull up, may reinforce with tape. If the dressing is no longer working, may remove and cover with gauze and tape, but must keep the area dry and clean.  Call with any questions or concerns.   02/12/20 1023          Diagnostic Studies: No results found.  Disposition: Home  Discharge Instructions    Call MD / Call 911   Complete by: As directed    If you experience chest pain or shortness of breath, CALL 911 and be transported to the hospital emergency room.  If you develope a fever above 101 F, pus (white drainage) or increased drainage or redness at the wound, or calf pain, call your surgeon's office.   Change dressing   Complete by: As directed    Maintain surgical dressing until follow up in the clinic. If the edges  start to pull up, may reinforce with tape. If the dressing is no longer working, may remove and cover with gauze and tape, but must keep the area dry and clean.  Call with any questions or concerns.   Constipation Prevention   Complete by: As directed    Drink plenty of fluids.  Prune juice may be helpful.  You may use a stool softener,  such as Colace (over the counter) 100 mg twice a day.  Use MiraLax (over the counter) for constipation as needed.   Diet - low sodium heart healthy   Complete by: As directed    Discharge instructions   Complete by: As directed    Maintain surgical dressing until follow up in the clinic. If the edges start to pull up, may reinforce with tape. If the dressing is no longer working, may remove and cover with gauze and tape, but must keep the area dry and clean.  Follow up in 2 weeks at [facility]. Call with any questions or concerns.   Increase activity slowly as tolerated   Complete by: As directed    Weight bearing as tolerated with assist device (walker, cane, etc) as directed, use it as long as suggested by your surgeon or therapist, typically at least 4-6 weeks.   TED hose   Complete by: As directed    Use stockings (TED hose) for 2 weeks on both leg(s).  You may remove them at night for sleeping.       Follow-up Information    Paralee Cancel, MD. Schedule an appointment as soon as possible for a visit in 2 weeks.   Specialty: Orthopedic Surgery Contact information: [address] [place] [address] [phone]                Signed: Lucille Passy [facility] 02/22/2020, 12:39 PM

## 2020-03-01 ENCOUNTER — Ambulatory Visit: Payer: Medicare Other

## 2020-03-03 ENCOUNTER — Ambulatory Visit: Payer: Medicare Other | Attending: Orthopedic Surgery

## 2020-03-03 ENCOUNTER — Other Ambulatory Visit: Payer: Self-pay

## 2020-03-03 DIAGNOSIS — G8929 Other chronic pain: Secondary | ICD-10-CM | POA: Diagnosis present

## 2020-03-03 DIAGNOSIS — M25561 Pain in right knee: Secondary | ICD-10-CM | POA: Diagnosis not present

## 2020-03-03 DIAGNOSIS — R262 Difficulty in walking, not elsewhere classified: Secondary | ICD-10-CM | POA: Diagnosis present

## 2020-03-03 DIAGNOSIS — M6281 Muscle weakness (generalized): Secondary | ICD-10-CM | POA: Diagnosis present

## 2020-03-03 NOTE — Patient Instructions (Signed)
°  Seated knee flexion/extension AAROM  Sitting on a chair or on your bed   Gently roll the ball towards you with your foot to feel a comfortable stretch bending at your knee. Hold for 5 seconds.    Gently roll the ball out to feel an extension stretch at your knee. Hold for 5 seconds.    Keep your thigh from wobbling.     Repeat 10 times.   Perform 3 sets daily .

## 2020-03-03 NOTE — Therapy (Signed)
[facility] [address] [place], [address] Phone: [phone]   Fax:  [phone]  Physical Therapy Evaluation  Patient Details  Name: Destiny Shaw MRN: [id number] Date of Birth: 1951-01-22 Referring Provider (PT): Paralee Cancel, MD   Encounter Date: 03/03/2020   PT End of Session - 03/03/20 1607    Visit Number 1    Number of Visits 25    Date for PT Re-Evaluation 05/26/20    Authorization Type 1    Authorization Time Period of 10 progress report    PT Start Time [id number]    PT Stop Time 1652    PT Time Calculation (min) 45 min    Activity Tolerance Patient tolerated treatment well    Behavior During Therapy [facility] for tasks assessed/performed           Past Medical History:  Diagnosis Date  . ADD (attention deficit disorder)   . Anemia   . Anginal pain ([place])    pt states has occas chest pain relates to indigestion; pt uses rest to relieve DR [place]-   . Anxiety   . Arthritis   . Concussion   . Depression   . Diabetes mellitus without complication ([place])    type 2   . Dizziness   . Fall   . GERD (gastroesophageal reflux disease)   . Headache   . History of urinary tract infection   . Hyperlipidemia   . Hypertension   . IBS (irritable bowel syndrome)   . Imbalance   . Numbness    right leg   . Numbness in both hands    comes and goes   . Pneumonia    last episode approx 1 year ago   . Sleep apnea    uses cpap not all the time   . Wears glasses     Past Surgical History:  Procedure Laterality Date  . BREAST CYST ASPIRATION Left 1980's   neg  . BREAST CYST EXCISION Right 1980's   neg  . BREAST LUMPECTOMY Right   . BREAST SURGERY    . CARPAL TUNNEL RELEASE    . CESAREAN SECTION     times 2  . COLONOSCOPY WITH PROPOFOL N/A 05/11/2015   Procedure: COLONOSCOPY WITH PROPOFOL;  Surgeon: Manya Silvas, MD;  Location[facility] ENDOSCOPY;  Service: Endoscopy;   Laterality: N/A;  . DE QUERVAIN'S RELEASE Right   . ESOPHAGOGASTRODUODENOSCOPY (EGD) WITH PROPOFOL N/A 05/11/2015   Procedure: ESOPHAGOGASTRODUODENOSCOPY (EGD) WITH PROPOFOL;  Surgeon: Manya Silvas, MD;  Location: [facility] ENDOSCOPY;  Service: Endoscopy;  Laterality: N/A;  . EYE SURGERY     laser surgery bilat   . HERNIA REPAIR    . KNEE ARTHROSCOPY    . LUMBAR LAMINECTOMY/DECOMPRESSION MICRODISCECTOMY Bilateral 09/14/2015   Procedure: MICRO LUMBAR BILATERAL DECOMPRESSION L4 - L5;  Surgeon: Susa Day, MD;  Location: [facility] ORS;  Service: Orthopedics;  Laterality: Bilateral;  . REDUCTION MAMMAPLASTY Bilateral 1980  . RHINOPLASTY    . TONSILLECTOMY    . TOTAL KNEE ARTHROPLASTY Left 06/18/2016   Procedure: LEFT TOTAL KNEE ARTHROPLASTY;  Surgeon: Paralee Cancel, MD;  Location: [facility] ORS;  Service: Orthopedics;  Laterality: Left;  Adductor Block  . TOTAL KNEE ARTHROPLASTY Right 02/11/2020   Procedure: TOTAL KNEE ARTHROPLASTY;  Surgeon: Paralee Cancel, MD;  Location: [facility] ORS;  Service: Orthopedics;  Laterality: Right;  . TUBAL LIGATION    . UVULOPALATOPHARYNGOPLASTY      There  were no vitals filed for this visit.    Subjective Assessment - 03/03/20 1611    Subjective R knee pain: 8/10 currently, 9/10 at worst for the past 2 weeks.    Pertinent History S/P R TKA on 02/11/2020. Pt is currently 3 weeks post op. R knee does not feel great. Has not had PT since leaving the hospital. Has been doing ankle pumps as well as walking with her RW.    Currently in Pain? Yes    Pain Score 8     Pain Orientation Right    Pain Descriptors / Indicators Tightness    Pain Type Surgical pain;Chronic pain    Pain Onset More than a month ago    Pain Frequency Constant   from surgery   Aggravating Factors  Bending her knee, walking    Pain Relieving Factors rest              [facility] PT Assessment - 03/03/20 1616      Assessment   Medical Diagnosis S/P R TKA    Referring Provider (PT) Paralee Cancel, MD    Onset  Date/Surgical Date 02/11/20    Prior Therapy Pt participated in prior PT with for low back, R and L knee pain      Precautions   Precaution Comments No known precautions.      Restrictions   Other Position/Activity Restrictions WBAT      Posture/Postural Control   Posture Comments Decreased R knee extension, L LE weight shift      AROM   Right Knee Extension -28    Right Knee Flexion 76      Strength   Right Hip Flexion 4-/5    Right Hip Extension 4-/5   seated manually resisted   Right Hip ABduction 4/5   seated manually resisted clamshell isometric   Right Knee Flexion 4-/5    Right Knee Extension 4/5      Palpation   Palpation comment Decreast fascial mobility R knee anterior      Ambulation/Gait   Gait Comments rw, antalgic, decreased stance R LE, Decreased knee extension during stance phase, decreased knee flexion during swing phase, decreased heel strike                      Objective measurements completed on examination: See above findings.   Swelling, scab formation, healing satisfactorily   s/p R TKR 02/11/2020  3 weeks post op   Manual therapy  Seated STM R anterior knee to decrease fascial stiffness    Therapeutic Exercise  Gait with rw with emphasis on heel strike, knee extension during foot flat, and knee flexion during swing phase 100 ft.   seated R knee flexion   PROM with PT 10x3  AAROM with PT 10x3  90 degrees knee flexion sitting AROM afterwards  Seated R knee flexion and knee extension ball rolls AAROM 10x5 seconds each direction.   Reviewed and given as part of her HEP. Pt demonstrated and verbalized understanding. Handout provided.     Get FOTO info Next visit   Improved exercise technique, movement at target joints, use of target muscles after mod verbal, visual, tactile cues.    Response to treatment Improved ROM.   Clinical impresssion Patient is a 70 year old female who came to physical therapy S/P R TKA on  02/11/2020. She currently presents altered gait pattern and posture, R hip and knee weakness, R knee stiffness, decreased fascial mobility R knee, and  difficulty performing functional tasks such a gait, transfers and stair negotiation. Pt will benefit from skilled physical therapy services to address the aforementioned deficits.          PT Education - 03/03/20 1757    Education Details ther-ex, HEP, plan of care    Person(s) Educated Patient    Methods Explanation;Demonstration;Tactile cues;Verbal cues;Handout    Comprehension Returned demonstration;Verbalized understanding            PT Short Term Goals - 03/03/20 1803      PT SHORT TERM GOAL #1   Title Patient will be independent with her initial HEP to improve R knee ROM, strength, and decrease difficulty with gait.    Baseline Pt has started her HEP (03/03/2020)    Time 3    Period Weeks    Status New    Target Date 03/24/20             PT Long Term Goals - 03/03/20 1804      PT LONG TERM GOAL #1   Title Patient will improve R knee flexion AROM to 120 degrees to promote ability to ambulate and negotiate stairs with less difficulty.    Baseline 76 degrees setaed R knee flexion AROM (03/03/2020)    Time 12    Period Weeks    Status New    Target Date 05/26/20      PT LONG TERM GOAL #2   Title Patient will improve R knee extension AROM to -5 degrees or more to promote ability to ambulate as well as perform standing tasks with less difficulty.    Baseline -28 degrees R knee extension AROM (03/03/2020).    Time 12    Period Weeks    Status New    Target Date 05/26/20      PT LONG TERM GOAL #3   Title Pt will improve R knee flexion and extension strength to 5/5 to promote ability to perform standing tasks with less difficulty.    Baseline 4-/5 R knee flexion and 4/5 extension (03/03/2020)    Time 12    Period Weeks    Status New    Target Date 05/26/20      PT LONG TERM GOAL #4   Title Pt will be able to ambulate at  least 300 ft independently and without LOB to promote mobility.    Baseline Pt currently ambulating with rw (03/03/2020)    Time 12    Period Weeks    Status New    Target Date 05/26/20      PT LONG TERM GOAL #5   Title Pt will improve her R knee FOTO score by at least 10 points as a demonstration of improved function.    Baseline Pt was unable to fill out FOTO at eval (03/03/2020)    Time 12    Period Weeks    Status New    Target Date 05/26/20                  Plan - 03/03/20 1757    Clinical Impression Statement Patient is a 70 year old female who came to physical therapy S/P R TKA on 02/11/2020. She currently presents altered gait pattern and posture, R hip and knee weakness, R knee stiffness, decreased fascial mobility R knee, and difficulty performing functional tasks such a gait, transfers and stair negotiation. Pt will benefit from skilled physical therapy services to address the aforementioned deficits.    Personal Factors and Comorbidities Comorbidity  3+;Age;Fitness;Past/Current Experience    Comorbidities DM, dizziness, HTN, memory    Examination-Activity Limitations Squat;Stairs;Bed Mobility;Bend;Locomotion Level;Stand;Carry;Transfers    Stability/Clinical Decision Making Stable/Uncomplicated    Clinical Decision Making Low    Clinical Presentation due to: Improved knee flexion ROM after session.    Rehab Potential Fair    Clinical Impairments Affecting Rehab Potential (-) multiple areas of chronic pain, age, memory. (+) motivated    PT Frequency 2x / week    PT Duration 12 weeks    PT Treatment/Interventions Therapeutic activities;Therapeutic exercise;Balance training;Neuromuscular re-education;Patient/family education;Manual techniques;Dry needling;Vestibular;Aquatic Therapy;Canalith Repostioning;Electrical Stimulation;Iontophoresis 4mg /ml Dexamethasone;Gait training;Stair training    PT Next Visit Plan ROM, hip and knee strengthening, gait, manual techniques,  modalities PRN    Consulted and Agree with Plan of Care Patient           Patient will benefit from skilled therapeutic intervention in order to improve the following deficits and impairments:  Pain,Improper body mechanics,Postural dysfunction,Difficulty walking,Decreased strength,Decreased scar mobility,Decreased range of motion,Abnormal gait  Visit Diagnosis: Chronic pain of right knee - Plan: PT plan of care cert/re-cert  Difficulty in walking, not elsewhere classified - Plan: PT plan of care cert/re-cert  Muscle weakness (generalized) - Plan: PT plan of care cert/re-cert     Problem List Patient Active Problem List   Diagnosis Date Noted  . Osteoarthritis of right knee 02/12/2020  . S/P total knee arthroplasty, right 02/11/2020  . Obese 06/20/2016  . Status post right knee replacement 06/19/2016  . S/P left TKA 06/18/2016  . Spinal stenosis of lumbar region 09/14/2015    09/16/2015 PT, DPT   03/03/2020, 6:22 PM  [place] [facility] [address], [place] Phone: [phone]   Fax:  [phone]  Name: Destiny Shaw MRN: Norlene Campbell Date of Birth: [birth year]

## 2020-03-08 ENCOUNTER — Ambulatory Visit: Payer: Medicare Other

## 2020-03-10 ENCOUNTER — Other Ambulatory Visit: Payer: Self-pay

## 2020-03-10 ENCOUNTER — Ambulatory Visit: Payer: Medicare Other

## 2020-03-10 DIAGNOSIS — M6281 Muscle weakness (generalized): Secondary | ICD-10-CM

## 2020-03-10 DIAGNOSIS — G8929 Other chronic pain: Secondary | ICD-10-CM

## 2020-03-10 DIAGNOSIS — M25561 Pain in right knee: Secondary | ICD-10-CM | POA: Diagnosis not present

## 2020-03-10 DIAGNOSIS — R262 Difficulty in walking, not elsewhere classified: Secondary | ICD-10-CM

## 2020-03-10 NOTE — Patient Instructions (Signed)
Access Code: [email or web address] URL: [email or web address] Date: 03/10/2020 Prepared by: Joneen Boers  Exercises Standing Quad Set - 1 x daily - 7 x weekly - 3 sets - 10 reps - 5 seconds hold

## 2020-03-10 NOTE — Therapy (Signed)
[facility] [address], [place], [address] Phone: [phone]   Fax:  [phone]  Physical Therapy Treatment  Patient Details  Name: Destiny Shaw MRN: [id number] Date of Birth: November 09, 1950 Referring Provider (PT): Paralee Cancel, MD   Encounter Date: 03/10/2020   PT End of Session - 03/10/20 1551    Visit Number 2    Number of Visits 25    Date for PT Re-Evaluation 05/26/20    Authorization Type 2    Authorization Time Period of 10 progress report    PT Start Time 1549    PT Stop Time 1631    PT Time Calculation (min) 42 min    Activity Tolerance Patient tolerated treatment well    Behavior During Therapy [facility] for tasks assessed/performed           Past Medical History:  Diagnosis Date  . ADD (attention deficit disorder)   . Anemia   . Anginal pain ([place])    pt states has occas chest pain relates to indigestion; pt uses rest to relieve DR [place]-   . Anxiety   . Arthritis   . Concussion   . Depression   . Diabetes mellitus without complication ([place])    type 2   . Dizziness   . Fall   . GERD (gastroesophageal reflux disease)   . Headache   . History of urinary tract infection   . Hyperlipidemia   . Hypertension   . IBS (irritable bowel syndrome)   . Imbalance   . Numbness    right leg   . Numbness in both hands    comes and goes   . Pneumonia    last episode approx 1 year ago   . Sleep apnea    uses cpap not all the time   . Wears glasses     Past Surgical History:  Procedure Laterality Date  . BREAST CYST ASPIRATION Left 1980's   neg  . BREAST CYST EXCISION Right 1980's   neg  . BREAST LUMPECTOMY Right   . BREAST SURGERY    . CARPAL TUNNEL RELEASE    . CESAREAN SECTION     times 2  . COLONOSCOPY WITH PROPOFOL N/A 05/11/2015   Procedure: COLONOSCOPY WITH PROPOFOL;  Surgeon: Manya Silvas, MD;  Location: [facility] ENDOSCOPY;  Service: Endoscopy;   Laterality: N/A;  . DE QUERVAIN'S RELEASE Right   . ESOPHAGOGASTRODUODENOSCOPY (EGD) WITH PROPOFOL N/A 05/11/2015   Procedure: ESOPHAGOGASTRODUODENOSCOPY (EGD) WITH PROPOFOL;  Surgeon: Manya Silvas, MD;  [facility]: [facility] ENDOSCOPY;  Service: Endoscopy;  Laterality: N/A;  . EYE SURGERY     laser surgery bilat   . HERNIA REPAIR    . KNEE ARTHROSCOPY    . LUMBAR LAMINECTOMY/DECOMPRESSION MICRODISCECTOMY Bilateral 09/14/2015   Procedure: MICRO LUMBAR BILATERAL DECOMPRESSION L4 - L5;  Surgeon: Susa Day, MD;  Location: [facility] ORS;  Service: Orthopedics;  Laterality: Bilateral;  . REDUCTION MAMMAPLASTY Bilateral 1980  . RHINOPLASTY    . TONSILLECTOMY    . TOTAL KNEE ARTHROPLASTY Left 06/18/2016   Procedure: LEFT TOTAL KNEE ARTHROPLASTY;  Surgeon: Paralee Cancel, MD;  Location: [facility] ORS;  Service: Orthopedics;  Laterality: Left;  Adductor Block  . TOTAL KNEE ARTHROPLASTY Right 02/11/2020   Procedure: TOTAL KNEE ARTHROPLASTY;  Surgeon: Paralee Cancel, MD;  Location: [facility] ORS;  Service: Orthopedics;  Laterality: Right;  . TUBAL LIGATION    . UVULOPALATOPHARYNGOPLASTY      There  were no vitals filed for this visit.   Subjective Assessment - 03/10/20 1551    Subjective R knee is not great. 7/10 R knee at rest. Has been bending her knee to stretch.    Pertinent History S/P R TKA on 02/11/2020. Pt is currently 3 weeks post op. R knee does not feel great. Has not had PT since leaving the hospital. Has been doing ankle pumps as well as walking with her RW.    Currently in Pain? Yes    Pain Score 7     Pain Onset More than a month ago                                     PT Education - 03/10/20 1617    Education Details ther-ex, HEP    Person(s) Educated Patient    Methods Explanation;Demonstration;Tactile cues;Verbal cues;Handout    Comprehension Returned demonstration;Verbalized understanding          Objective   Swelling, scab formation, healing satisfactorily   s/p  R TKR 02/11/2020 3-4 weeks post op     Medbridge Access Code [email or web address]   Manual therapy Seated STM R anterior knee to decrease fascial stiffness    Therapeutic Exercise   Seated quad set, leg straight 10x2 with 5 second holds    seated R knee flexion              PROM with PT 10x3             AAROM with PT 10x3             97 degrees knee flexion sitting AROM afterwards   Standing TKE with rw 10x3 with 5 second holds   Reviewed HEP with pt and son.     Gait with rw with emphasis on heel strike, knee extension during foot flat, and knee flexion during swing phase 80 ft Seated R knee flexion and knee extension ball rolls AAROM 10x5 seconds each direction.               Get FOTO info Next visit   Improved exercise technique, movement at target joints, use of target muscles after mod verbal, visual, tactile cues.    Response to treatment Improved ROM.   Clinical impresssion Improved knee flexion AROM after manual therapy to decrease fascial restrictions. Worked on knee extension as well to decrease stiffness and improve knee extension during stance phase. Pt tolerated session well without aggravation of symptoms. Pt will benefit from continued skilled physical therapy services to improve ROM, strength and function.     PT Short Term Goals - 03/03/20 1803      PT SHORT TERM GOAL #1   Title Patient will be independent with her initial HEP to improve R knee ROM, strength, and decrease difficulty with gait.    Baseline Pt has started her HEP (03/03/2020)    Time 3    Period Weeks    Status New    Target Date 03/24/20             PT Long Term Goals - 03/03/20 1804      PT LONG TERM GOAL #1   Title Patient will improve R knee flexion AROM to 120 degrees to promote ability to ambulate and negotiate stairs with less difficulty.    Baseline 76 degrees setaed R knee flexion AROM (03/03/2020)    Time 12  Period Weeks    Status New    Target Date 05/26/20       PT LONG TERM GOAL #2   Title Patient will improve R knee extension AROM to -5 degrees or more to promote ability to ambulate as well as perform standing tasks with less difficulty.    Baseline -28 degrees R knee extension AROM (03/03/2020).    Time 12    Period Weeks    Status New    Target Date 05/26/20      PT LONG TERM GOAL #3   Title Pt will improve R knee flexion and extension strength to 5/5 to promote ability to perform standing tasks with less difficulty.    Baseline 4-/5 R knee flexion and 4/5 extension (03/03/2020)    Time 12    Period Weeks    Status New    Target Date 05/26/20      PT LONG TERM GOAL #4   Title Pt will be able to ambulate at least 300 ft independently and without LOB to promote mobility.    Baseline Pt currently ambulating with rw (03/03/2020)    Time 12    Period Weeks    Status New    Target Date 05/26/20      PT LONG TERM GOAL #5   Title Pt will improve her R knee FOTO score by at least 10 points as a demonstration of improved function.    Baseline Pt was unable to fill out FOTO at eval (03/03/2020)    Time 12    Period Weeks    Status New    Target Date 05/26/20                 Plan - 03/10/20 2107    Clinical Impression Statement Improved knee flexion AROM after manual therapy to decrease fascial restrictions. Worked on knee extension as well to decrease stiffness and improve knee extension during stance phase. Pt tolerated session well without aggravation of symptoms. Pt will benefit from continued skilled physical therapy services to improve ROM, strength and function.    Personal Factors and Comorbidities Comorbidity 3+;Age;Fitness;Past/Current Experience    Comorbidities DM, dizziness, HTN, memory    Examination-Activity Limitations Squat;Stairs;Bed Mobility;Bend;Locomotion Level;Stand;Carry;Transfers    Stability/Clinical Decision Making Stable/Uncomplicated    Clinical Decision Making Low    Rehab Potential Fair    Clinical  Impairments Affecting Rehab Potential (-) multiple areas of chronic pain, age, memory. (+) motivated    PT Frequency 2x / week    PT Duration 12 weeks    PT Treatment/Interventions Therapeutic activities;Therapeutic exercise;Balance training;Neuromuscular re-education;Patient/family education;Manual techniques;Dry needling;Vestibular;Aquatic Therapy;Canalith Repostioning;Electrical Stimulation;Iontophoresis 4mg /ml Dexamethasone;Gait training;Stair training    PT Next Visit Plan ROM, hip and knee strengthening, gait, manual techniques, modalities PRN    Consulted and Agree with Plan of Care Patient           Patient will benefit from skilled therapeutic intervention in order to improve the following deficits and impairments:  Pain,Improper body mechanics,Postural dysfunction,Difficulty walking,Decreased strength,Decreased scar mobility,Decreased range of motion,Abnormal gait  Visit Diagnosis: Chronic pain of right knee  Difficulty in walking, not elsewhere classified  Muscle weakness (generalized)     Problem List Patient Active Problem List   Diagnosis Date Noted  . Osteoarthritis of right knee 02/12/2020  . S/P total knee arthroplasty, right 02/11/2020  . Obese 06/20/2016  . Status post right knee replacement 06/19/2016  . S/P left TKA 06/18/2016  . Spinal stenosis of lumbar region 09/14/2015  Joneen Boers PT, DPT   03/10/2020, 9:13 PM  [facility] [address], [place], [address] Phone: [phone]   Fax:  [phone]  Name: Destiny Shaw MRN: [id number] Date of Birth: November 22, 1950

## 2020-03-15 ENCOUNTER — Ambulatory Visit: Payer: Medicare Other

## 2020-03-17 ENCOUNTER — Other Ambulatory Visit: Payer: Self-pay

## 2020-03-17 ENCOUNTER — Ambulatory Visit: Payer: Medicare Other

## 2020-03-17 DIAGNOSIS — M25561 Pain in right knee: Secondary | ICD-10-CM

## 2020-03-17 DIAGNOSIS — M6281 Muscle weakness (generalized): Secondary | ICD-10-CM

## 2020-03-17 DIAGNOSIS — R262 Difficulty in walking, not elsewhere classified: Secondary | ICD-10-CM

## 2020-03-17 DIAGNOSIS — G8929 Other chronic pain: Secondary | ICD-10-CM

## 2020-03-17 NOTE — Therapy (Signed)
[facility] [address], [place], [address] Phone: [phone]   Fax:  [phone]  Physical Therapy Treatment  Patient Details  Name: Destiny Shaw MRN: [id number] Date of Birth: 03/24/1950 Referring Provider (PT): Paralee Cancel, MD   Encounter Date: 03/17/2020   PT End of Session - 03/17/20 1513    Visit Number 3    Number of Visits 25    Date for PT Re-Evaluation 05/26/20    Authorization Type 3    Authorization Time Period of 10 progress report    PT Start Time 1513    PT Stop Time 1554    PT Time Calculation (min) 41 min    Activity Tolerance Patient tolerated treatment well    Behavior During Therapy [facility] for tasks assessed/performed           Past Medical History:  Diagnosis Date  . ADD (attention deficit disorder)   . Anemia   . Anginal pain ([place])    pt states has occas chest pain relates to indigestion; pt uses rest to relieve DR [place]-   . Anxiety   . Arthritis   . Concussion   . Depression   . Diabetes mellitus without complication ([place])    type 2   . Dizziness   . Fall   . GERD (gastroesophageal reflux disease)   . Headache   . History of urinary tract infection   . Hyperlipidemia   . Hypertension   . IBS (irritable bowel syndrome)   . Imbalance   . Numbness    right leg   . Numbness in both hands    comes and goes   . Pneumonia    last episode approx 1 year ago   . Sleep apnea    uses cpap not all the time   . Wears glasses     Past Surgical History:  Procedure Laterality Date  . BREAST CYST ASPIRATION Left 1980's   neg  . BREAST CYST EXCISION Right 1980's   neg  . BREAST LUMPECTOMY Right   . BREAST SURGERY    . CARPAL TUNNEL RELEASE    . CESAREAN SECTION     times 2  . COLONOSCOPY WITH PROPOFOL N/A 05/11/2015   Procedure: COLONOSCOPY WITH PROPOFOL;  Surgeon: Manya Silvas, MD;  Location: [facility] ENDOSCOPY;  Service: Endoscopy;   Laterality: N/A;  . DE QUERVAIN'S RELEASE Right   . ESOPHAGOGASTRODUODENOSCOPY (EGD) WITH PROPOFOL N/A 05/11/2015   Procedure: ESOPHAGOGASTRODUODENOSCOPY (EGD) WITH PROPOFOL;  Surgeon: Manya Silvas, MD;  Location: [facility] ENDOSCOPY;  Service: Endoscopy;  Laterality: N/A;  . EYE SURGERY     laser surgery bilat   . HERNIA REPAIR    . KNEE ARTHROSCOPY    . LUMBAR LAMINECTOMY/DECOMPRESSION MICRODISCECTOMY Bilateral 09/14/2015   Procedure: MICRO LUMBAR BILATERAL DECOMPRESSION L4 - L5;  Surgeon: Susa Day, MD;  Location: [facility];  Service: Orthopedics;  Laterality: Bilateral;  . REDUCTION MAMMAPLASTY Bilateral 1980  . RHINOPLASTY    . TONSILLECTOMY    . TOTAL KNEE ARTHROPLASTY Left 06/18/2016   Procedure: LEFT TOTAL KNEE ARTHROPLASTY;  Surgeon: Paralee Cancel, MD;  Location: [facility];  Service: Orthopedics;  Laterality: Left;  Adductor Block  . TOTAL KNEE ARTHROPLASTY Right 02/11/2020   Procedure: TOTAL KNEE ARTHROPLASTY;  Surgeon: Paralee Cancel, MD;  Location: [facility];  Service: Orthopedics;  Laterality: Right;  . TUBAL LIGATION    . UVULOPALATOPHARYNGOPLASTY      There  were no vitals filed for this visit.   Subjective Assessment - 03/17/20 1515    Subjective R knee is probably 5/10 currently. Pt states that her R knee hurts which keeps her from sleeping... wakes her up. Might start taking pain medication.    Pertinent History S/P R TKA on 02/11/2020. Pt is currently 3 weeks post op. R knee does not feel great. Has not had PT since leaving the hospital. Has been doing ankle pumps as well as walking with her RW.    Currently in Pain? Yes    Pain Score 5     Pain Onset More than a month ago                                     PT Education - 03/17/20 1822    Education Details ther-ex, gait with SPC    Person(s) Educated Patient    Methods Explanation;Demonstration;Tactile cues;Verbal cues    Comprehension Returned demonstration;Verbalized understanding            Objective   Swelling, scab formation, healing satisfactorily  s/p R TKR12/16/2021 5 weeks post op    Medbridge Access Code [email or web address]   Manual therapy  Seated STM R anterior knee  to decrease fascial stiffness  Seated STM R lateral hamstrings to decrease tension    Therapeutic Exercise   Gait with SPC on L CGA 140 ft. No LOB  Practice with SPC at least one more visit prior to transitioning to a [facility].   Forward step up onto and over 4 inch step with SPC CGA 8x. No LOB   Standing TKE with L UE assist 10x5 seconds   Tight R lateral hamstrings palpated  seated R knee flexion  PROM with PT 10x3 AAROM with PT 10x3  106 degrees knee flexion sitting AROM afterwards   Get FOTO info Next visit   Improved exercise technique, movement at target joints, use of target muscles after mod verbal, visual, tactile cues.   Response to treatment Improved ROM. Pt tolerated session well without aggravation of symptoms.  Clinical impresssion Improving R knee flexion AROM with treatment. Worked on gait with SPC on L side to help transition to least restrictive AD. No LOB. Cues needed however to maintain proper technique consistently. Pt tolerated session well without aggravation of symptoms. Pt will benefit from continued skilled physical therapy services to improve AROM, strength, balance, function and ability to ambulate with less difficulty.      PT Short Term Goals - 03/03/20 1803      PT SHORT TERM GOAL #1   Title Patient will be independent with her initial HEP to improve R knee ROM, strength, and decrease difficulty with gait.    Baseline Pt has started her HEP (03/03/2020)    Time 3    Period Weeks    Status New    Target Date 03/24/20             PT Long Term Goals - 03/03/20 1804      PT LONG TERM GOAL #1   Title Patient will improve R knee flexion AROM to 120 degrees to promote ability to ambulate and negotiate stairs  with less difficulty.    Baseline 76 degrees setaed R knee flexion AROM (03/03/2020)    Time 12    Period Weeks    Status New    Target Date 05/26/20  PT LONG TERM GOAL #2   Title Patient will improve R knee extension AROM to -5 degrees or more to promote ability to ambulate as well as perform standing tasks with less difficulty.    Baseline -28 degrees R knee extension AROM (03/03/2020).    Time 12    Period Weeks    Status New    Target Date 05/26/20      PT LONG TERM GOAL #3   Title Pt will improve R knee flexion and extension strength to 5/5 to promote ability to perform standing tasks with less difficulty.    Baseline 4-/5 R knee flexion and 4/5 extension (03/03/2020)    Time 12    Period Weeks    Status New    Target Date 05/26/20      PT LONG TERM GOAL #4   Title Pt will be able to ambulate at least 300 ft independently and without LOB to promote mobility.    Baseline Pt currently ambulating with rw (03/03/2020)    Time 12    Period Weeks    Status New    Target Date 05/26/20      PT LONG TERM GOAL #5   Title Pt will improve her R knee FOTO score by at least 10 points as a demonstration of improved function.    Baseline Pt was unable to fill out FOTO at eval (03/03/2020)    Time 12    Period Weeks    Status New    Target Date 05/26/20                 Plan - 03/17/20 1512    Clinical Impression Statement Improving R knee flexion AROM with treatment. Worked on gait with SPC on L side to help transition to least restrictive AD. No LOB. Cues needed however to maintain proper technique consistently. Pt tolerated session well without aggravation of symptoms. Pt will benefit from continued skilled physical therapy services to improve AROM, strength, balance, function and ability to ambulate with less difficulty.    Personal Factors and Comorbidities Comorbidity 3+;Age;Fitness;Past/Current Experience    Comorbidities DM, dizziness, HTN, memory    Examination-Activity  Limitations Squat;Stairs;Bed Mobility;Bend;Locomotion Level;Stand;Carry;Transfers    Stability/Clinical Decision Making Stable/Uncomplicated    Rehab Potential Fair    Clinical Impairments Affecting Rehab Potential (-) multiple areas of chronic pain, age, memory. (+) motivated    PT Frequency 2x / week    PT Duration 12 weeks    PT Treatment/Interventions Therapeutic activities;Therapeutic exercise;Balance training;Neuromuscular re-education;Patient/family education;Manual techniques;Dry needling;Vestibular;Aquatic Therapy;Canalith Repostioning;Electrical Stimulation;Iontophoresis 4mg /ml Dexamethasone;Gait training;Stair training    PT Next Visit Plan ROM, hip and knee strengthening, gait, manual techniques, modalities PRN    Consulted and Agree with Plan of Care Patient           Patient will benefit from skilled therapeutic intervention in order to improve the following deficits and impairments:  Pain,Improper body mechanics,Postural dysfunction,Difficulty walking,Decreased strength,Decreased scar mobility,Decreased range of motion,Abnormal gait  Visit Diagnosis: Chronic pain of right knee  Difficulty in walking, not elsewhere classified  Muscle weakness (generalized)     Problem List Patient Active Problem List   Diagnosis Date Noted  . Osteoarthritis of right knee 02/12/2020  . S/P total knee arthroplasty, right 02/11/2020  . Obese 06/20/2016  . Status post right knee replacement 06/19/2016  . S/P left TKA 06/18/2016  . Spinal stenosis of lumbar region 09/14/2015    Joneen Boers PT, DPT   03/17/2020, 6:28 PM  [place]  [facility] [address], [place], [address] Phone: [phone]   Fax:  [phone]  Name: Destiny Shaw MRN: [id number] Date of Birth: 1950/11/28

## 2020-03-22 ENCOUNTER — Other Ambulatory Visit: Payer: Self-pay

## 2020-03-22 ENCOUNTER — Ambulatory Visit: Payer: Medicare Other

## 2020-03-22 DIAGNOSIS — M25561 Pain in right knee: Secondary | ICD-10-CM | POA: Diagnosis not present

## 2020-03-22 DIAGNOSIS — G8929 Other chronic pain: Secondary | ICD-10-CM

## 2020-03-22 DIAGNOSIS — R262 Difficulty in walking, not elsewhere classified: Secondary | ICD-10-CM

## 2020-03-22 DIAGNOSIS — M6281 Muscle weakness (generalized): Secondary | ICD-10-CM

## 2020-03-22 NOTE — Therapy (Signed)
[facility] [address] [place], [address] Phone: [phone]   Fax:  [phone]  Physical Therapy Treatment  Patient Details  Name: Destiny Shaw MRN: [id number] Date of Birth: 10-Jul-1950 Referring Provider (PT): Paralee Cancel, MD   Encounter Date: 03/22/2020   PT End of Session - 03/22/20 1302    Visit Number 4    Number of Visits 25    Date for PT Re-Evaluation 05/26/20    Authorization Type 4    Authorization Time Period of 10 progress report    PT Start Time 1302    PT Stop Time 2297    PT Time Calculation (min) 47 min    Activity Tolerance Patient tolerated treatment well    Behavior During Therapy [facility] for tasks assessed/performed           Past Medical History:  Diagnosis Date  . ADD (attention deficit disorder)   . Anemia   . Anginal pain ([place])    pt states has occas chest pain relates to indigestion; pt uses rest to relieve DR [place]-   . Anxiety   . Arthritis   . Concussion   . Depression   . Diabetes mellitus without complication ([place])    type 2   . Dizziness   . Fall   . GERD (gastroesophageal reflux disease)   . Headache   . History of urinary tract infection   . Hyperlipidemia   . Hypertension   . IBS (irritable bowel syndrome)   . Imbalance   . Numbness    right leg   . Numbness in both hands    comes and goes   . Pneumonia    last episode approx 1 year ago   . Sleep apnea    uses cpap not all the time   . Wears glasses     Past Surgical History:  Procedure Laterality Date  . BREAST CYST ASPIRATION Left 1980's   neg  . BREAST CYST EXCISION Right 1980's   neg  . BREAST LUMPECTOMY Right   . BREAST SURGERY    . CARPAL TUNNEL RELEASE    . CESAREAN SECTION     times 2  . COLONOSCOPY WITH PROPOFOL N/A 05/11/2015   Procedure: COLONOSCOPY WITH PROPOFOL;  Surgeon: Manya Silvas, MD;  Location: [facility] ENDOSCOPY;  Service: Endoscopy;   Laterality: N/A;  . DE QUERVAIN'S RELEASE Right   . ESOPHAGOGASTRODUODENOSCOPY (EGD) WITH PROPOFOL N/A 05/11/2015   Procedure: ESOPHAGOGASTRODUODENOSCOPY (EGD) WITH PROPOFOL;  Surgeon: Manya Silvas, MD;  Location: [facility] ENDOSCOPY;  Service: Endoscopy;  Laterality: N/A;  . EYE SURGERY     laser surgery bilat   . HERNIA REPAIR    . KNEE ARTHROSCOPY    . LUMBAR LAMINECTOMY/DECOMPRESSION MICRODISCECTOMY Bilateral 09/14/2015   Procedure: MICRO LUMBAR BILATERAL DECOMPRESSION L4 - L5;  Surgeon: Susa Day, MD;  Location: [facility] ORS;  Service: Orthopedics;  Laterality: Bilateral;  . REDUCTION MAMMAPLASTY Bilateral 1980  . RHINOPLASTY    . TONSILLECTOMY    . TOTAL KNEE ARTHROPLASTY Left 06/18/2016   Procedure: LEFT TOTAL KNEE ARTHROPLASTY;  Surgeon: Paralee Cancel, MD;  Location: [facility] ORS;  Service: Orthopedics;  Laterality: Left;  Adductor Block  . TOTAL KNEE ARTHROPLASTY Right 02/11/2020   Procedure: TOTAL KNEE ARTHROPLASTY;  Surgeon: Paralee Cancel, MD;  Location: [facility] ORS;  Service: Orthopedics;  Laterality: Right;  . TUBAL LIGATION    . UVULOPALATOPHARYNGOPLASTY      There  were no vitals filed for this visit.   Subjective Assessment - 03/22/20 1304    Subjective R knee has been aching for the past 4-5 days. 8/10 currently when walking. Feels disoriented. does not feel like herself today. Did not eat lunch. Does not know if she ate breakfast. Does not feel like eating.    Pertinent History S/P R TKA on 02/11/2020. Pt is currently 3 weeks post op. R knee does not feel great. Has not had PT since leaving the hospital. Has been doing ankle pumps as well as walking with her RW.    Currently in Pain? Yes    Pain Score 8     Pain Onset More than a month ago                                     PT Education - 03/22/20 1403    Education Details ther-ex    Person(s) Educated Patient    Methods Explanation;Demonstration;Tactile cues;Verbal cues    Comprehension Returned  demonstration;Verbalized understanding             Objective   Swelling, scab formation, healing satisfactorily  s/p R TKR12/16/2021 5-6weeks post op    MedbridgeAccess Code [email or web address]   Manual therapy Seated STM R anterior knee  to decrease fascial stiffness  Seated STM R lateral and media hamstrings to decrease tension    Therapeutic Exercise   Gait with SPC on L SBA 120 ft. R knee feels better per pt.   Then 100 ft.     Some unsteadiness, no LOB. Some difficulty with gait technique with SPC             Forward step up onto and over 4 inch step with SPC CGA 10x. No LOB. Mod cues for proper foot use.    Feels disoriented. does not feel like herself today. Did not eat lunch. Does not know if she ate breakfast. Does not feel like eating per pt.   Blood pressure, L arm sitting, mechanically taken, normal cuff: 149/59, HR 72  Pt states feeling better afterwards.     Seated R knee flexion heel slides 10x5 seconds for 2 sets  Seated R knee extension quad set 10x5 seconds for 2 sets  Pt states trying to do the survey last time but could not get it all coordinated together. Was feeling uncomfortable. (multiple attempts were made to get FOTO. FOTO might not be appropriate for pt)     Improved exercise technique, movement at target joints, use of target muscles after mod verbal, visual, tactile cues.   Response to treatment Improved ROM. Pt tolerated session well without aggravation of symptoms.  Clinical impresssion Continued working on gait with SPC on L LE to transition to the AD. Still demonstrates some unsteadiness and difficulty with proper technique at times. Also worked on knee flexion and extension ROM to decrease stiffness. Pt education on importance in continuing to work on knee ROM throughout the day secondary to stiffness. Memory seems to play a factor with progress in which pt continues to need cues or reminders to perform exercises  properly. No abnormal warmth, swelling, discoloration or redness R knee. R knee surgical incision healing satisfactorily, only small scabs left. Pt tolerated session well without aggravation of symptoms. Pt will benefit from continued skilled physical therapy services to improve ROM, strength, and function.      PT Short Term Goals -  03/03/20 1803      PT SHORT TERM GOAL #1   Title Patient will be independent with her initial HEP to improve R knee ROM, strength, and decrease difficulty with gait.    Baseline Pt has started her HEP (03/03/2020)    Time 3    Period Weeks    Status New    Target Date 03/24/20             PT Long Term Goals - 03/03/20 1804      PT LONG TERM GOAL #1   Title Patient will improve R knee flexion AROM to 120 degrees to promote ability to ambulate and negotiate stairs with less difficulty.    Baseline 76 degrees setaed R knee flexion AROM (03/03/2020)    Time 12    Period Weeks    Status New    Target Date 05/26/20      PT LONG TERM GOAL #2   Title Patient will improve R knee extension AROM to -5 degrees or more to promote ability to ambulate as well as perform standing tasks with less difficulty.    Baseline -28 degrees R knee extension AROM (03/03/2020).    Time 12    Period Weeks    Status New    Target Date 05/26/20      PT LONG TERM GOAL #3   Title Pt will improve R knee flexion and extension strength to 5/5 to promote ability to perform standing tasks with less difficulty.    Baseline 4-/5 R knee flexion and 4/5 extension (03/03/2020)    Time 12    Period Weeks    Status New    Target Date 05/26/20      PT LONG TERM GOAL #4   Title Pt will be able to ambulate at least 300 ft independently and without LOB to promote mobility.    Baseline Pt currently ambulating with rw (03/03/2020)    Time 12    Period Weeks    Status New    Target Date 05/26/20      PT LONG TERM GOAL #5   Title Pt will improve her R knee FOTO score by at least 10 points as a  demonstration of improved function.    Baseline Pt was unable to fill out FOTO at eval (03/03/2020)    Time 12    Period Weeks    Status New    Target Date 05/26/20                 Plan - 03/22/20 1404    Clinical Impression Statement Continued working on gait with SPC on L LE to transition to the AD. Still demonstrates some unsteadiness and difficulty with proper technique at times. Also worked on knee flexion and extension ROM to decrease stiffness. Pt education on importance in continuing to work on knee ROM throughout the day secondary to stiffness. Memory seems to play a factor with progress in which pt continues to need cues or reminders to perform exercises properly. No abnormal warmth, swelling, discoloration or redness R knee. R knee surgical incision healing satisfactorily, only small scabs left. Pt tolerated session well without aggravation of symptoms. Pt will benefit from continued skilled physical therapy services to improve ROM, strength, and function.    Personal Factors and Comorbidities Comorbidity 3+;Age;Fitness;Past/Current Experience    Comorbidities DM, dizziness, HTN, memory    Examination-Activity Limitations Squat;Stairs;Bed Mobility;Bend;Locomotion Level;Stand;Carry;Transfers    Stability/Clinical Decision Making Stable/Uncomplicated    Rehab Potential Fair  Clinical Impairments Affecting Rehab Potential (-) multiple areas of chronic pain, age, memory. (+) motivated    PT Frequency 2x / week    PT Duration 12 weeks    PT Treatment/Interventions Therapeutic activities;Therapeutic exercise;Balance training;Neuromuscular re-education;Patient/family education;Manual techniques;Dry needling;Vestibular;Aquatic Therapy;Canalith Repostioning;Electrical Stimulation;Iontophoresis 4mg /ml Dexamethasone;Gait training;Stair training    PT Next Visit Plan ROM, hip and knee strengthening, gait, manual techniques, modalities PRN    Consulted and Agree with Plan of Care Patient            Patient will benefit from skilled therapeutic intervention in order to improve the following deficits and impairments:  Pain,Improper body mechanics,Postural dysfunction,Difficulty walking,Decreased strength,Decreased scar mobility,Decreased range of motion,Abnormal gait  Visit Diagnosis: Chronic pain of right knee  Difficulty in walking, not elsewhere classified  Muscle weakness (generalized)     Problem List Patient Active Problem List   Diagnosis Date Noted  . Osteoarthritis of right knee 02/12/2020  . S/P total knee arthroplasty, right 02/11/2020  . Obese 06/20/2016  . Status post right knee replacement 06/19/2016  . S/P left TKA 06/18/2016  . Spinal stenosis of lumbar region 09/14/2015    Joneen Boers PT, DPT   03/22/2020, 2:06 PM  [facility] [address] [place], [address] Phone: [phone]   Fax:  [phone]  Name: ARIANA CALZADO MRN: [id number] Date of Birth: 02-03-1951

## 2020-03-24 ENCOUNTER — Other Ambulatory Visit: Payer: Self-pay

## 2020-03-24 ENCOUNTER — Ambulatory Visit: Payer: Medicare Other

## 2020-03-24 DIAGNOSIS — G8929 Other chronic pain: Secondary | ICD-10-CM

## 2020-03-24 DIAGNOSIS — R262 Difficulty in walking, not elsewhere classified: Secondary | ICD-10-CM

## 2020-03-24 DIAGNOSIS — M25561 Pain in right knee: Secondary | ICD-10-CM | POA: Diagnosis not present

## 2020-03-24 DIAGNOSIS — M6281 Muscle weakness (generalized): Secondary | ICD-10-CM

## 2020-03-24 NOTE — Therapy (Signed)
[facility] [address] [place], [address] Phone: [phone]   Fax:  [phone]  Physical Therapy Treatment  Patient Details  Name: Destiny DAUPHINEE MRN: [id number] Date of Birth: Sep 12, 1950 Referring Provider (PT): Paralee Cancel, MD   Encounter Date: 03/24/2020   PT End of Session - 03/24/20 1119    Visit Number 5    Number of Visits 25    Date for PT Re-Evaluation 05/26/20    Authorization Type 5    Authorization Time Period of 10 progress report    PT Start Time 1119    PT Stop Time 1200    PT Time Calculation (min) 41 min    Activity Tolerance Patient tolerated treatment well    Behavior During Therapy [facility] for tasks assessed/performed           Past Medical History:  Diagnosis Date  . ADD (attention deficit disorder)   . Anemia   . Anginal pain ([place])    pt states has occas chest pain relates to indigestion; pt uses rest to relieve DR [place]-   . Anxiety   . Arthritis   . Concussion   . Depression   . Diabetes mellitus without complication ([place])    type 2   . Dizziness   . Fall   . GERD (gastroesophageal reflux disease)   . Headache   . History of urinary tract infection   . Hyperlipidemia   . Hypertension   . IBS (irritable bowel syndrome)   . Imbalance   . Numbness    right leg   . Numbness in both hands    comes and goes   . Pneumonia    last episode approx 1 year ago   . Sleep apnea    uses cpap not all the time   . Wears glasses     Past Surgical History:  Procedure Laterality Date  . BREAST CYST ASPIRATION Left 1980's   neg  . BREAST CYST EXCISION Right 1980's   neg  . BREAST LUMPECTOMY Right   . BREAST SURGERY    . CARPAL TUNNEL RELEASE    . CESAREAN SECTION     times 2  . COLONOSCOPY WITH PROPOFOL N/A 05/11/2015   Procedure: COLONOSCOPY WITH PROPOFOL;  Surgeon: Manya Silvas, MD;  Location: [facility] ENDOSCOPY;  Service: Endoscopy;   Laterality: N/A;  . DE QUERVAIN'S RELEASE Right   . ESOPHAGOGASTRODUODENOSCOPY (EGD) WITH PROPOFOL N/A 05/11/2015   Procedure: ESOPHAGOGASTRODUODENOSCOPY (EGD) WITH PROPOFOL;  Surgeon: Manya Silvas, MD;  Location: [facility] ENDOSCOPY;  Service: Endoscopy;  Laterality: N/A;  . EYE SURGERY     laser surgery bilat   . HERNIA REPAIR    . KNEE ARTHROSCOPY    . LUMBAR LAMINECTOMY/DECOMPRESSION MICRODISCECTOMY Bilateral 09/14/2015   Procedure: MICRO LUMBAR BILATERAL DECOMPRESSION L4 - L5;  Surgeon: Susa Day, MD;  Location: [facility] ORS;  Service: Orthopedics;  Laterality: Bilateral;  . REDUCTION MAMMAPLASTY Bilateral 1980  . RHINOPLASTY    . TONSILLECTOMY    . TOTAL KNEE ARTHROPLASTY Left 06/18/2016   Procedure: LEFT TOTAL KNEE ARTHROPLASTY;  Surgeon: Paralee Cancel, MD;  Location: [facility] ORS;  Service: Orthopedics;  Laterality: Left;  Adductor Block  . TOTAL KNEE ARTHROPLASTY Right 02/11/2020   Procedure: TOTAL KNEE ARTHROPLASTY;  Surgeon: Paralee Cancel, MD;  Location: [facility];  Service: Orthopedics;  Laterality: Right;  . TUBAL LIGATION    . UVULOPALATOPHARYNGOPLASTY      There  were no vitals filed for this visit.   Subjective Assessment - 03/24/20 1121    Subjective Feeling better. Ate before she came to today's session. Ate a sandwhich. Drank half a glass of water. R knee is better than it was. Horrible last night, bad this morning, Looks pretty good. Her knee appointment wiht Dr. Alvan Dame went well. Dr. said that her knee looks better than it feel.  8/10 R knee currently. Pt also states feeling like she is getting dementia from her fall, hitting her head a few years ago.    Pertinent History S/P R TKA on 02/11/2020. Pt is currently 3 weeks post op. R knee does not feel great. Has not had PT since leaving the hospital. Has been doing ankle pumps as well as walking with her RW.    Currently in Pain? Yes    Pain Score 8     Pain Onset More than a month ago                                      PT Education - 03/24/20 1123    Education Details ther-ex    [employer]) Educated Patient    Methods Explanation;Demonstration;Tactile cues;Verbal cues    Comprehension Returned demonstration;Verbalized understanding          Objective   Swelling, scab formation, healing satisfactorily  s/p R TKR12/16/2021 5-6weeks post op    MedbridgeAccess Code [email or web address]   Manual therapy Seated STM R anterior knee to decrease fascial stiffness  Seated STM R lateral hamstrings and vastus lateralis to decrease tension   Therapeutic Exercise   Gait with SPC on L, SBA to CGA 110 ft. LOB x 1 with mod A secondary to dizziness. Pt states only drinking half a glass of water. Water provided during rest.   seated R knee flexion  PROM with PT 10x3 AAROM with PT 10x3  110degrees knee flexion sitting AROM afterwards  Seated R knee extension AAROM to end range with PT 10x5 seconds for 3 sets to improve ROM.   -22 degrees seated R knee extension AROM afterwards.     Improved exercise technique, movement at target joints, use of target muscles after mod verbal, visual, tactile cues.   Response to treatment Pt states R knee feels better and can move it more after session.   Clinical impresssion Continued working on soft tissue mobilization to decrease fascial stiffness and muscle tension around her R knee, followed by ROM exercises. Improving overall R knee flexion ROM with pt reports of R knee feeling better after session. Pt still demonstrates some unsteadiness with use of SPC. Pt will benefit from continued skilled physical therapy services to improve ROM, strength, balance, and function. Challenges to progress include other comorbidities such as memory difficulties.       PT Short Term Goals - 03/03/20 1803      PT SHORT TERM GOAL #1   Title Patient will be independent with  her initial HEP to improve R knee ROM, strength, and decrease difficulty with gait.    Baseline Pt has started her HEP (03/03/2020)    Time 3    Period Weeks    Status New    Target Date 03/24/20             PT Long Term Goals - 03/03/20 1804      PT LONG TERM GOAL #1   Title Patient will improve  R knee flexion AROM to 120 degrees to promote ability to ambulate and negotiate stairs with less difficulty.    Baseline 76 degrees setaed R knee flexion AROM (03/03/2020)    Time 12    Period Weeks    Status New    Target Date 05/26/20      PT LONG TERM GOAL #2   Title Patient will improve R knee extension AROM to -5 degrees or more to promote ability to ambulate as well as perform standing tasks with less difficulty.    Baseline -28 degrees R knee extension AROM (03/03/2020).    Time 12    Period Weeks    Status New    Target Date 05/26/20      PT LONG TERM GOAL #3   Title Pt will improve R knee flexion and extension strength to 5/5 to promote ability to perform standing tasks with less difficulty.    Baseline 4-/5 R knee flexion and 4/5 extension (03/03/2020)    Time 12    Period Weeks    Status New    Target Date 05/26/20      PT LONG TERM GOAL #4   Title Pt will be able to ambulate at least 300 ft independently and without LOB to promote mobility.    Baseline Pt currently ambulating with rw (03/03/2020)    Time 12    Period Weeks    Status New    Target Date 05/26/20      PT LONG TERM GOAL #5   Title Pt will improve her R knee FOTO score by at least 10 points as a demonstration of improved function.    Baseline Pt was unable to fill out FOTO at eval (03/03/2020)    Time 12    Period Weeks    Status New    Target Date 05/26/20                 Plan - 03/24/20 1215    Clinical Impression Statement Continued working on soft tissue mobilization to decrease fascial stiffness and muscle tension around her R knee, followed by ROM exercises. Improving overall R knee flexion  ROM with pt reports of R knee feeling better after session. Pt still demonstrates some unsteadiness with use of SPC. Pt will benefit from continued skilled physical therapy services to improve ROM, strength, balance, and function. Challenges to progress include other comorbidities such as memory difficulties.    Personal Factors and Comorbidities Comorbidity 3+;Age;Fitness;Past/Current Experience    Comorbidities DM, dizziness, HTN, memory    Examination-Activity Limitations Squat;Stairs;Bed Mobility;Bend;Locomotion Level;Stand;Carry;Transfers    Stability/Clinical Decision Making Stable/Uncomplicated    Rehab Potential Fair    Clinical Impairments Affecting Rehab Potential (-) multiple areas of chronic pain, age, memory. (+) motivated    PT Frequency 2x / week    PT Duration 12 weeks    PT Treatment/Interventions Therapeutic activities;Therapeutic exercise;Balance training;Neuromuscular re-education;Patient/family education;Manual techniques;Dry needling;Vestibular;Aquatic Therapy;Canalith Repostioning;Electrical Stimulation;Iontophoresis 4mg /ml Dexamethasone;Gait training;Stair training    PT Next Visit Plan ROM, hip and knee strengthening, gait, manual techniques, modalities PRN    Consulted and Agree with Plan of Care Patient           Patient will benefit from skilled therapeutic intervention in order to improve the following deficits and impairments:  Pain,Improper body mechanics,Postural dysfunction,Difficulty walking,Decreased strength,Decreased scar mobility,Decreased range of motion,Abnormal gait  Visit Diagnosis: Chronic pain of right knee  Difficulty in walking, not elsewhere classified  Muscle weakness (generalized)     Problem List  Patient Active Problem List   Diagnosis Date Noted  . Osteoarthritis of right knee 02/12/2020  . S/P total knee arthroplasty, right 02/11/2020  . Obese 06/20/2016  . Status post right knee replacement 06/19/2016  . S/P left TKA 06/18/2016   . Spinal stenosis of lumbar region 09/14/2015    Joneen Boers PT, DPT   03/24/2020, 12:17 PM  [facility] [address] [place], [address] Phone: [phone]   Fax:  [phone]  Name: JANCIE DOESCHER MRN: [id number] Date of Birth: [birth year]

## 2020-03-29 ENCOUNTER — Ambulatory Visit: Payer: Medicare Other | Attending: Orthopedic Surgery

## 2020-03-29 ENCOUNTER — Other Ambulatory Visit: Payer: Self-pay

## 2020-03-29 DIAGNOSIS — M25561 Pain in right knee: Secondary | ICD-10-CM | POA: Insufficient documentation

## 2020-03-29 DIAGNOSIS — M6281 Muscle weakness (generalized): Secondary | ICD-10-CM | POA: Diagnosis present

## 2020-03-29 DIAGNOSIS — R262 Difficulty in walking, not elsewhere classified: Secondary | ICD-10-CM | POA: Insufficient documentation

## 2020-03-29 DIAGNOSIS — G8929 Other chronic pain: Secondary | ICD-10-CM | POA: Diagnosis present

## 2020-03-29 NOTE — Therapy (Signed)
[facility] [address] [place], [address] Phone: [phone]   Fax:  [phone]  Physical Therapy Treatment  Patient Details  Name: Destiny Shaw MRN: [id number] Date of Birth: 02-19-1951 Referring Provider (PT): Paralee Cancel, MD   Encounter Date: 03/29/2020   PT End of Session - 03/29/20 1120    Visit Number 6    Number of Visits 25    Date for PT Re-Evaluation 05/26/20    Authorization Type 6    Authorization Time Period of 10 progress report    PT Start Time 1120    PT Stop Time 1205    PT Time Calculation (min) 45 min    Activity Tolerance Patient tolerated treatment well    Behavior During Therapy [facility] for tasks assessed/performed           Past Medical History:  Diagnosis Date  . ADD (attention deficit disorder)   . Anemia   . Anginal pain ([place])    pt states has occas chest pain relates to indigestion; pt uses rest to relieve DR [place]-   . Anxiety   . Arthritis   . Concussion   . Depression   . Diabetes mellitus without complication ([place])    type 2   . Dizziness   . Fall   . GERD (gastroesophageal reflux disease)   . Headache   . History of urinary tract infection   . Hyperlipidemia   . Hypertension   . IBS (irritable bowel syndrome)   . Imbalance   . Numbness    right leg   . Numbness in both hands    comes and goes   . Pneumonia    last episode approx 1 year ago   . Sleep apnea    uses cpap not all the time   . Wears glasses     Past Surgical History:  Procedure Laterality Date  . BREAST CYST ASPIRATION Left 1980's   neg  . BREAST CYST EXCISION Right 1980's   neg  . BREAST LUMPECTOMY Right   . BREAST SURGERY    . CARPAL TUNNEL RELEASE    . CESAREAN SECTION     times 2  . COLONOSCOPY WITH PROPOFOL N/A 05/11/2015   Procedure: COLONOSCOPY WITH PROPOFOL;  Surgeon: Manya Silvas, MD;  Location: [facility] ENDOSCOPY;  Service: Endoscopy;   Laterality: N/A;  . DE QUERVAIN'S RELEASE Right   . ESOPHAGOGASTRODUODENOSCOPY (EGD) WITH PROPOFOL N/A 05/11/2015   Procedure: ESOPHAGOGASTRODUODENOSCOPY (EGD) WITH PROPOFOL;  Surgeon: Manya Silvas, MD;  Location: [facility] ENDOSCOPY;  Service: Endoscopy;  Laterality: N/A;  . EYE SURGERY     laser surgery bilat   . HERNIA REPAIR    . KNEE ARTHROSCOPY    . LUMBAR LAMINECTOMY/DECOMPRESSION MICRODISCECTOMY Bilateral 09/14/2015   Procedure: MICRO LUMBAR BILATERAL DECOMPRESSION L4 - L5;  Surgeon: Susa Day, MD;  Location: [facility] ORS;  Service: Orthopedics;  Laterality: Bilateral;  . REDUCTION MAMMAPLASTY Bilateral 1980  . RHINOPLASTY    . TONSILLECTOMY    . TOTAL KNEE ARTHROPLASTY Left 06/18/2016   Procedure: LEFT TOTAL KNEE ARTHROPLASTY;  Surgeon: Paralee Cancel, MD;  Location: [facility] ORS;  Service: Orthopedics;  Laterality: Left;  Adductor Block  . TOTAL KNEE ARTHROPLASTY Right 02/11/2020   Procedure: TOTAL KNEE ARTHROPLASTY;  Surgeon: Paralee Cancel, MD;  Location: [facility] ORS;  Service: Orthopedics;  Laterality: Right;  . TUBAL LIGATION    . UVULOPALATOPHARYNGOPLASTY      There  were no vitals filed for this visit.   Subjective Assessment - 03/29/20 1122    Subjective A little tingly and a little achy. 6/10 currently.    Pertinent History S/P R TKA on 02/11/2020. Pt is currently 3 weeks post op. R knee does not feel great. Has not had PT since leaving the hospital. Has been doing ankle pumps as well as walking with her RW.    Currently in Pain? Yes    Pain Score 6     Pain Onset More than a month ago                                     PT Education - 03/29/20 1230    Education Details ther-ex, gait wiht SPC    Person(s) Educated Patient    Methods Explanation;Demonstration;Tactile cues;Verbal cues    Comprehension Verbalized understanding;Returned demonstration          Objective   Swelling, scab formation, healing satisfactorily  s/p R TKR12/16/2021 6weeks  post op    MedbridgeAccess Code [email or web address]   Manual therapy Seated STM R anterior knee and vastus lateralis, and R IT band to decrease muscle tension and fascial stiffness  Seated STM R medial and lateralhamstrings to decrease muscle tension   Therapeutic Exercise   seated R knee flexion  PROM with PT 10x3 AAROM with PT 10x3  112degrees knee flexion sitting AROM afterwards  Standing TKE R 10x5 seconds for 2 sets   Gait with SPC on L, SBA 100 ft. No LOB  Sit <> stand from High low table 5x, height 19.5 inches, no hands.    Improved exercise technique, movement at target joints, use of target muscles after mod verbal, visual, tactile cues.   Response to treatment Pt tolerates session well without aggravation of symptoms  Clinical impresssion Improving overall R knee flexion AROM and ability to ambulate with SPC. Continued with manual therapy to decrease muscle tension and stiffness as well as general LE strengthening and ambulation with SPC to promote mobility and function. Pt tolerated session well without aggravation of symptoms. Pt will benefit from continued skilled physical therapy services to decrease stiffness, improve ROM, strength, balance, and function.      PT Short Term Goals - 03/03/20 1803      PT SHORT TERM GOAL #1   Title Patient will be independent with her initial HEP to improve R knee ROM, strength, and decrease difficulty with gait.    Baseline Pt has started her HEP (03/03/2020)    Time 3    Period Weeks    Status New    Target Date 03/24/20             PT Long Term Goals - 03/03/20 1804      PT LONG TERM GOAL #1   Title Patient will improve R knee flexion AROM to 120 degrees to promote ability to ambulate and negotiate stairs with less difficulty.    Baseline 76 degrees setaed R knee flexion AROM (03/03/2020)    Time 12    Period Weeks    Status New    Target Date 05/26/20      PT LONG  TERM GOAL #2   Title Patient will improve R knee extension AROM to -5 degrees or more to promote ability to ambulate as well as perform standing tasks with less difficulty.    Baseline -28 degrees R knee extension AROM (  03/03/2020).    Time 12    Period Weeks    Status New    Target Date 05/26/20      PT LONG TERM GOAL #3   Title Pt will improve R knee flexion and extension strength to 5/5 to promote ability to perform standing tasks with less difficulty.    Baseline 4-/5 R knee flexion and 4/5 extension (03/03/2020)    Time 12    Period Weeks    Status New    Target Date 05/26/20      PT LONG TERM GOAL #4   Title Pt will be able to ambulate at least 300 ft independently and without LOB to promote mobility.    Baseline Pt currently ambulating with rw (03/03/2020)    Time 12    Period Weeks    Status New    Target Date 05/26/20      PT LONG TERM GOAL #5   Title Pt will improve her R knee FOTO score by at least 10 points as a demonstration of improved function.    Baseline Pt was unable to fill out FOTO at eval (03/03/2020)    Time 12    Period Weeks    Status New    Target Date 05/26/20                 Plan - 03/29/20 1230    Clinical Impression Statement Improving overall R knee flexion AROM and ability to ambulate with SPC. Continued with manual therapy to decrease muscle tension and stiffness as well as general LE strengthening and ambulation with SPC to promote mobility and function. Pt tolerated session well without aggravation of symptoms. Pt will benefit from continued skilled physical therapy services to decrease stiffness, improve ROM, strength, balance, and function.    Personal Factors and Comorbidities Comorbidity 3+;Age;Fitness;Past/Current Experience    Comorbidities DM, dizziness, HTN, memory    Examination-Activity Limitations Squat;Stairs;Bed Mobility;Bend;Locomotion Level;Stand;Carry;Transfers    Stability/Clinical Decision Making Stable/Uncomplicated    Rehab  Potential Fair    Clinical Impairments Affecting Rehab Potential (-) multiple areas of chronic pain, age, memory. (+) motivated    PT Frequency 2x / week    PT Duration 12 weeks    PT Treatment/Interventions Therapeutic activities;Therapeutic exercise;Balance training;Neuromuscular re-education;Patient/family education;Manual techniques;Dry needling;Vestibular;Aquatic Therapy;Canalith Repostioning;Electrical Stimulation;Iontophoresis 4mg /ml Dexamethasone;Gait training;Stair training    PT Next Visit Plan ROM, hip and knee strengthening, gait, manual techniques, modalities PRN    Consulted and Agree with Plan of Care Patient           Patient will benefit from skilled therapeutic intervention in order to improve the following deficits and impairments:  Pain,Improper body mechanics,Postural dysfunction,Difficulty walking,Decreased strength,Decreased scar mobility,Decreased range of motion,Abnormal gait  Visit Diagnosis: Chronic pain of right knee  Difficulty in walking, not elsewhere classified  Muscle weakness (generalized)     Problem List Patient Active Problem List   Diagnosis Date Noted  . Osteoarthritis of right knee 02/12/2020  . S/P total knee arthroplasty, right 02/11/2020  . Obese 06/20/2016  . Status post right knee replacement 06/19/2016  . S/P left TKA 06/18/2016  . Spinal stenosis of lumbar region 09/14/2015    Joneen Boers PT, DPT   03/29/2020, 12:34 PM  [facility] [address] [place], [address] Phone: [phone]   Fax:  [phone]  Name: Destiny Shaw MRN: [id number] Date of Birth: 1950/04/21

## 2020-03-31 ENCOUNTER — Other Ambulatory Visit: Payer: Self-pay

## 2020-03-31 ENCOUNTER — Ambulatory Visit: Payer: Medicare Other

## 2020-03-31 DIAGNOSIS — M6281 Muscle weakness (generalized): Secondary | ICD-10-CM

## 2020-03-31 DIAGNOSIS — M25561 Pain in right knee: Secondary | ICD-10-CM | POA: Diagnosis not present

## 2020-03-31 DIAGNOSIS — R262 Difficulty in walking, not elsewhere classified: Secondary | ICD-10-CM

## 2020-03-31 DIAGNOSIS — G8929 Other chronic pain: Secondary | ICD-10-CM

## 2020-03-31 NOTE — Therapy (Signed)
[facility] [address], [place], [address] Phone: [phone]   Fax:  [phone]  Physical Therapy Treatment  Patient Details  Name: Destiny Shaw MRN: [id number] Date of Birth: 1950/12/16 Referring Provider (PT): Paralee Cancel, MD   Encounter Date: 03/31/2020   PT End of Session - 03/31/20 1348    Visit Number 7    Number of Visits 25    Date for PT Re-Evaluation 05/26/20    Authorization Type 7    Authorization Time Period of 10 progress report    PT Start Time 1348    PT Stop Time 1429    PT Time Calculation (min) 41 min    Activity Tolerance Patient tolerated treatment well    Behavior During Therapy [facility] for tasks assessed/performed           Past Medical History:  Diagnosis Date  . ADD (attention deficit disorder)   . Anemia   . Anginal pain ([place])    pt states has occas chest pain relates to indigestion; pt uses rest to relieve DR [place]-   . Anxiety   . Arthritis   . Concussion   . Depression   . Diabetes mellitus without complication ([place])    type 2   . Dizziness   . Fall   . GERD (gastroesophageal reflux disease)   . Headache   . History of urinary tract infection   . Hyperlipidemia   . Hypertension   . IBS (irritable bowel syndrome)   . Imbalance   . Numbness    right leg   . Numbness in both hands    comes and goes   . Pneumonia    last episode approx 1 year ago   . Sleep apnea    uses cpap not all the time   . Wears glasses     Past Surgical History:  Procedure Laterality Date  . BREAST CYST ASPIRATION Left 1980's   neg  . BREAST CYST EXCISION Right 1980's   neg  . BREAST LUMPECTOMY Right   . BREAST SURGERY    . CARPAL TUNNEL RELEASE    . CESAREAN SECTION     times 2  . COLONOSCOPY WITH PROPOFOL N/A 05/11/2015   Procedure: COLONOSCOPY WITH PROPOFOL;  Surgeon: Manya Silvas, MD;  Location: [facility] ENDOSCOPY;  Service: Endoscopy;   Laterality: N/A;  . DE QUERVAIN'S RELEASE Right   . ESOPHAGOGASTRODUODENOSCOPY (EGD) WITH PROPOFOL N/A 05/11/2015   Procedure: ESOPHAGOGASTRODUODENOSCOPY (EGD) WITH PROPOFOL;  Surgeon: Manya Silvas, MD;  Location: [facility] ENDOSCOPY;  Service: Endoscopy;  Laterality: N/A;  . EYE SURGERY     laser surgery bilat   . HERNIA REPAIR    . KNEE ARTHROSCOPY    . LUMBAR LAMINECTOMY/DECOMPRESSION MICRODISCECTOMY Bilateral 09/14/2015   Procedure: MICRO LUMBAR BILATERAL DECOMPRESSION L4 - L5;  Surgeon: Susa Day, MD;  Location: [facility];  Service: Orthopedics;  Laterality: Bilateral;  . REDUCTION MAMMAPLASTY Bilateral 1980  . RHINOPLASTY    . TONSILLECTOMY    . TOTAL KNEE ARTHROPLASTY Left 06/18/2016   Procedure: LEFT TOTAL KNEE ARTHROPLASTY;  Surgeon: Paralee Cancel, MD;  Location: [facility];  Service: Orthopedics;  Laterality: Left;  Adductor Block  . TOTAL KNEE ARTHROPLASTY Right 02/11/2020   Procedure: TOTAL KNEE ARTHROPLASTY;  Surgeon: Paralee Cancel, MD;  Location: [facility];  Service: Orthopedics;  Laterality: Right;  . TUBAL LIGATION    . UVULOPALATOPHARYNGOPLASTY      There  were no vitals filed for this visit.   Subjective Assessment - 03/31/20 1350    Subjective R knee is better I think. Does not hurt me much until night time.    Pertinent History S/P R TKA on 02/11/2020. Pt is currently 3 weeks post op. R knee does not feel great. Has not had PT since leaving the hospital. Has been doing ankle pumps as well as walking with her RW.    Currently in Pain? Yes    Pain Score 5     Pain Onset More than a month ago                                     PT Education - 03/31/20 1406    Education Details ther-ex    [employer]) Educated Patient    Methods Explanation;Demonstration;Tactile cues;Verbal cues;Handout    Comprehension Returned demonstration;Verbalized understanding           Objective   Swelling, scab formation, healing satisfactorily  s/p R  TKR12/16/2021     MedbridgeAccess Code [email or web address]   Manual therapy Supine  STM R medial and lateralhamstringsto decrease muscle tension  Seated STM R anterior knee and vastus lateralis and medialisto decrease muscle tension and fascial stiffness    Seated R knee flexion AROM 110 degrees afterwards    Therapeutic Exercise   Supine quad sets 10x3 with 5 second holds   Reviewed and given as part of her HEP. Pt demonstrated and verbalized understanding. Handout provided.   Supine SLR R hip flexion 10x2   Seated R knee extension -17 degrees after extension ROM exercises and manual therapy     Improved exercise technique, movement at target joints, use of target muscles after mod verbal, visual, tactile cues.   Response to treatment Pt tolerates session well without aggravation of symptoms  Clinical impresssion Pt demonstrates improving R knee flexion and extension AROM since initial evaluation. Continued working on decreasing soft tissue restrictions as well as improving knee flexion and extension range. Improved R knee level of comfort reported with decreased stiffness. Pt tolerated session well without aggravation of symptoms. Pt will benefit from continued skilled physical therapy services to improve ROM, strength, balance, and function.        PT Short Term Goals - 03/03/20 1803      PT SHORT TERM GOAL #1   Title Patient will be independent with her initial HEP to improve R knee ROM, strength, and decrease difficulty with gait.    Baseline Pt has started her HEP (03/03/2020)    Time 3    Period Weeks    Status New    Target Date 03/24/20             PT Long Term Goals - 03/03/20 1804      PT LONG TERM GOAL #1   Title Patient will improve R knee flexion AROM to 120 degrees to promote ability to ambulate and negotiate stairs with less difficulty.    Baseline 76 degrees setaed R knee flexion AROM (03/03/2020)    Time 12    Period Weeks     Status New    Target Date 05/26/20      PT LONG TERM GOAL #2   Title Patient will improve R knee extension AROM to -5 degrees or more to promote ability to ambulate as well as perform standing tasks with less difficulty.    Baseline -28  degrees R knee extension AROM (03/03/2020).    Time 12    Period Weeks    Status New    Target Date 05/26/20      PT LONG TERM GOAL #3   Title Pt will improve R knee flexion and extension strength to 5/5 to promote ability to perform standing tasks with less difficulty.    Baseline 4-/5 R knee flexion and 4/5 extension (03/03/2020)    Time 12    Period Weeks    Status New    Target Date 05/26/20      PT LONG TERM GOAL #4   Title Pt will be able to ambulate at least 300 ft independently and without LOB to promote mobility.    Baseline Pt currently ambulating with rw (03/03/2020)    Time 12    Period Weeks    Status New    Target Date 05/26/20      PT LONG TERM GOAL #5   Title Pt will improve her R knee FOTO score by at least 10 points as a demonstration of improved function.    Baseline Pt was unable to fill out FOTO at eval (03/03/2020)    Time 12    Period Weeks    Status New    Target Date 05/26/20                 Plan - 03/31/20 1406    Clinical Impression Statement Pt demonstrates improving R knee flexion and extension AROM since initial evaluation. Continued working on decreasing soft tissue restrictions as well as improving knee flexion and extension range. Improved R knee level of comfort reported with decreased stiffness. Pt tolerated session well without aggravation of symptoms. Pt will benefit from continued skilled physical therapy services to improve ROM, strength, balance, and function.    Personal Factors and Comorbidities Comorbidity 3+;Age;Fitness;Past/Current Experience    Comorbidities DM, dizziness, HTN, memory    Examination-Activity Limitations Squat;Stairs;Bed Mobility;Bend;Locomotion Level;Stand;Carry;Transfers     Stability/Clinical Decision Making Stable/Uncomplicated    Clinical Decision Making Low    Rehab Potential Fair    Clinical Impairments Affecting Rehab Potential (-) multiple areas of chronic pain, age, memory. (+) motivated    PT Frequency 2x / week    PT Duration 12 weeks    PT Treatment/Interventions Therapeutic activities;Therapeutic exercise;Balance training;Neuromuscular re-education;Patient/family education;Manual techniques;Dry needling;Vestibular;Aquatic Therapy;Canalith Repostioning;Electrical Stimulation;Iontophoresis 4mg /ml Dexamethasone;Gait training;Stair training    PT Next Visit Plan ROM, hip and knee strengthening, gait, manual techniques, modalities PRN    Consulted and Agree with Plan of Care Patient           Patient will benefit from skilled therapeutic intervention in order to improve the following deficits and impairments:  Pain,Improper body mechanics,Postural dysfunction,Difficulty walking,Decreased strength,Decreased scar mobility,Decreased range of motion,Abnormal gait  Visit Diagnosis: Chronic pain of right knee  Difficulty in walking, not elsewhere classified  Muscle weakness (generalized)     Problem List Patient Active Problem List   Diagnosis Date Noted  . Osteoarthritis of right knee 02/12/2020  . S/P total knee arthroplasty, right 02/11/2020  . Obese 06/20/2016  . Status post right knee replacement 06/19/2016  . S/P left TKA 06/18/2016  . Spinal stenosis of lumbar region 09/14/2015    Joneen Boers PT, DPT   03/31/2020, 6:24 PM  [facility] [address], [place], [address] Phone: [phone]   Fax:  [phone]  Name: Destiny Shaw MRN: [id number] Date of Birth: 01/29/1951

## 2020-03-31 NOTE — Patient Instructions (Signed)
Access Code: [email or web address] URL: [email or web address] Date: 03/31/2020 Prepared by: Joneen Boers  Exercises Standing Quad Set - 5 x daily - 7 x weekly - 3 sets - 10 reps - 5 seconds hold Supine Quad Set - 1 x daily - 7 x weekly - 3 sets - 10 reps - 5 seconds hold

## 2020-04-05 ENCOUNTER — Ambulatory Visit: Payer: Medicare Other

## 2020-04-05 ENCOUNTER — Other Ambulatory Visit: Payer: Self-pay

## 2020-04-05 DIAGNOSIS — M25561 Pain in right knee: Secondary | ICD-10-CM | POA: Diagnosis not present

## 2020-04-05 DIAGNOSIS — M6281 Muscle weakness (generalized): Secondary | ICD-10-CM

## 2020-04-05 DIAGNOSIS — G8929 Other chronic pain: Secondary | ICD-10-CM

## 2020-04-05 DIAGNOSIS — R262 Difficulty in walking, not elsewhere classified: Secondary | ICD-10-CM

## 2020-04-05 NOTE — Therapy (Signed)
[facility] [address] [place], [address] Phone: [phone]   Fax:  [phone]  Physical Therapy Treatment  Patient Details  Name: Destiny Shaw MRN: [id number] Date of Birth: October 03, 1950 Referring Provider (PT): Paralee Cancel, MD   Encounter Date: 04/05/2020   PT End of Session - 04/05/20 1110    Visit Number 8    Number of Visits 25    Date for PT Re-Evaluation 05/26/20    Authorization Type 8    Authorization Time Period of 10 progress report    PT Start Time 1114    PT Stop Time 1159    PT Time Calculation (min) 45 min    Activity Tolerance Patient tolerated treatment well    Behavior During Therapy [facility] for tasks assessed/performed           Past Medical History:  Diagnosis Date  . ADD (attention deficit disorder)   . Anemia   . Anginal pain ([place])    pt states has occas chest pain relates to indigestion; pt uses rest to relieve DR [place]-   . Anxiety   . Arthritis   . Concussion   . Depression   . Diabetes mellitus without complication ([place])    type 2   . Dizziness   . Fall   . GERD (gastroesophageal reflux disease)   . Headache   . History of urinary tract infection   . Hyperlipidemia   . Hypertension   . IBS (irritable bowel syndrome)   . Imbalance   . Numbness    right leg   . Numbness in both hands    comes and goes   . Pneumonia    last episode approx 1 year ago   . Sleep apnea    uses cpap not all the time   . Wears glasses     Past Surgical History:  Procedure Laterality Date  . BREAST CYST ASPIRATION Left 1980's   neg  . BREAST CYST EXCISION Right 1980's   neg  . BREAST LUMPECTOMY Right   . BREAST SURGERY    . CARPAL TUNNEL RELEASE    . CESAREAN SECTION     times 2  . COLONOSCOPY WITH PROPOFOL N/A 05/11/2015   Procedure: COLONOSCOPY WITH PROPOFOL;  Surgeon: Manya Silvas, MD;  Location: [facility] ENDOSCOPY;  Service: Endoscopy;   Laterality: N/A;  . DE QUERVAIN'S RELEASE Right   . ESOPHAGOGASTRODUODENOSCOPY (EGD) WITH PROPOFOL N/A 05/11/2015   Procedure: ESOPHAGOGASTRODUODENOSCOPY (EGD) WITH PROPOFOL;  Surgeon: Manya Silvas, MD;  Location: [facility] ENDOSCOPY;  Service: Endoscopy;  Laterality: N/A;  . EYE SURGERY     laser surgery bilat   . HERNIA REPAIR    . KNEE ARTHROSCOPY    . LUMBAR LAMINECTOMY/DECOMPRESSION MICRODISCECTOMY Bilateral 09/14/2015   Procedure: MICRO LUMBAR BILATERAL DECOMPRESSION L4 - L5;  Surgeon: Susa Day, MD;  Location: [facility] ORS;  Service: Orthopedics;  Laterality: Bilateral;  . REDUCTION MAMMAPLASTY Bilateral 1980  . RHINOPLASTY    . TONSILLECTOMY    . TOTAL KNEE ARTHROPLASTY Left 06/18/2016   Procedure: LEFT TOTAL KNEE ARTHROPLASTY;  Surgeon: Paralee Cancel, MD;  Location: [facility] ORS;  Service: Orthopedics;  Laterality: Left;  Adductor Block  . TOTAL KNEE ARTHROPLASTY Right 02/11/2020   Procedure: TOTAL KNEE ARTHROPLASTY;  Surgeon: Paralee Cancel, MD;  Location: [facility] ORS;  Service: Orthopedics;  Laterality: Right;  . TUBAL LIGATION    . UVULOPALATOPHARYNGOPLASTY      There  were no vitals filed for this visit.   Subjective Assessment - 04/05/20 1116    Subjective 6/10 R knee pain currently.    Pertinent History S/P R TKA on 02/11/2020. Pt is currently 3 weeks post op. R knee does not feel great. Has not had PT since leaving the hospital. Has been doing ankle pumps as well as walking with her RW.    Currently in Pain? Yes    Pain Score 6     Pain Onset More than a month ago                                     PT Education - 04/05/20 1159    Education Details ther-ex    [employer]) Educated Patient    Methods Explanation;Demonstration;Tactile cues;Verbal cues    Comprehension Returned demonstration;Verbalized understanding          Objective   Swelling, scab formation, healing satisfactorily  s/p R TKR12/16/2021   S/P 7 wks  MedbridgeAccess Code  [email or web address]   Manual therapy  Seated STM R anterior kneeand vastus lateralis IT bandto decreasemuscle tension andfascial stiffness   Seated   STM Rmedial andlateralhamstringsto decreasemuscletension   Therapeutic Exercise  seated R knee flexion  PROM with PT 10x3 AAROM with PT 10x3  113degrees knee flexion sitting AROM afterwards  seated quad sets 10x3 with 5 second holds            Gait with SPC on L 200 ft  Sit <> stand from low mat table 18 inches, no UE assist 10x2     Improved exercise technique, movement at target joints, use of target muscles after mod verbal, visual, tactile cues.   Response to treatment Pt tolerates session well withoutaggravation of symptoms  Clinical impresssion Pt able to stand up without UE assist from 18 inch high surface. Maintained ROM knee flexion gains from previous session. Continued working on treatment to decrease fascial restrictions and muscle tension to R knee as well as improving general LE strength and knee ROM to improve function. Pt tolerated session well without aggravation of symptoms. Pt will benefit from continued skilled physical therapy services to improve ROM, strength and function.       PT Short Term Goals - 03/03/20 1803      PT SHORT TERM GOAL #1   Title Patient will be independent with her initial HEP to improve R knee ROM, strength, and decrease difficulty with gait.    Baseline Pt has started her HEP (03/03/2020)    Time 3    Period Weeks    Status New    Target Date 03/24/20             PT Long Term Goals - 03/03/20 1804      PT LONG TERM GOAL #1   Title Patient will improve R knee flexion AROM to 120 degrees to promote ability to ambulate and negotiate stairs with less difficulty.    Baseline 76 degrees setaed R knee flexion AROM (03/03/2020)    Time 12    Period Weeks    Status New    Target Date 05/26/20      PT LONG TERM GOAL #2   Title  Patient will improve R knee extension AROM to -5 degrees or more to promote ability to ambulate as well as perform standing tasks with less difficulty.    Baseline -28 degrees R knee extension AROM (  03/03/2020).    Time 12    Period Weeks    Status New    Target Date 05/26/20      PT LONG TERM GOAL #3   Title Pt will improve R knee flexion and extension strength to 5/5 to promote ability to perform standing tasks with less difficulty.    Baseline 4-/5 R knee flexion and 4/5 extension (03/03/2020)    Time 12    Period Weeks    Status New    Target Date 05/26/20      PT LONG TERM GOAL #4   Title Pt will be able to ambulate at least 300 ft independently and without LOB to promote mobility.    Baseline Pt currently ambulating with rw (03/03/2020)    Time 12    Period Weeks    Status New    Target Date 05/26/20      PT LONG TERM GOAL #5   Title Pt will improve her R knee FOTO score by at least 10 points as a demonstration of improved function.    Baseline Pt was unable to fill out FOTO at eval (03/03/2020)    Time 12    Period Weeks    Status New    Target Date 05/26/20                 Plan - 04/05/20 1108    Clinical Impression Statement Pt able to stand up without UE assist from 18 inch high surface. Maintained ROM knee flexion gains from previous session. Continued working on treatment to decrease fascial restrictions and muscle tension to R knee as well as improving general LE strength and knee ROM to improve function. Pt tolerated session well without aggravation of symptoms. Pt will benefit from continued skilled physical therapy services to improve ROM, strength and function.    Personal Factors and Comorbidities Comorbidity 3+;Age;Fitness;Past/Current Experience    Comorbidities DM, dizziness, HTN, memory    Examination-Activity Limitations Squat;Stairs;Bed Mobility;Bend;Locomotion Level;Stand;Carry;Transfers    Stability/Clinical Decision Making Stable/Uncomplicated    Rehab  Potential Fair    Clinical Impairments Affecting Rehab Potential (-) multiple areas of chronic pain, age, memory. (+) motivated    PT Frequency 2x / week    PT Duration 12 weeks    PT Treatment/Interventions Therapeutic activities;Therapeutic exercise;Balance training;Neuromuscular re-education;Patient/family education;Manual techniques;Dry needling;Vestibular;Aquatic Therapy;Canalith Repostioning;Electrical Stimulation;Iontophoresis 4mg /ml Dexamethasone;Gait training;Stair training    PT Next Visit Plan ROM, hip and knee strengthening, gait, manual techniques, modalities PRN    Consulted and Agree with Plan of Care Patient           Patient will benefit from skilled therapeutic intervention in order to improve the following deficits and impairments:  Pain,Improper body mechanics,Postural dysfunction,Difficulty walking,Decreased strength,Decreased scar mobility,Decreased range of motion,Abnormal gait  Visit Diagnosis: Chronic pain of right knee  Difficulty in walking, not elsewhere classified  Muscle weakness (generalized)     Problem List Patient Active Problem List   Diagnosis Date Noted  . Osteoarthritis of right knee 02/12/2020  . S/P total knee arthroplasty, right 02/11/2020  . Obese 06/20/2016  . Status post right knee replacement 06/19/2016  . S/P left TKA 06/18/2016  . Spinal stenosis of lumbar region 09/14/2015    Joneen Boers PT, DPT   04/05/2020, 12:11 PM  [facility] [address], [place], [address] Phone: [phone]   Fax:  [phone]  Name: Destiny Shaw MRN: [id number] Date of Birth: [birth year]

## 2020-04-07 ENCOUNTER — Ambulatory Visit: Payer: Medicare Other

## 2020-04-07 ENCOUNTER — Other Ambulatory Visit: Payer: Self-pay

## 2020-04-07 DIAGNOSIS — M25561 Pain in right knee: Secondary | ICD-10-CM | POA: Diagnosis not present

## 2020-04-07 DIAGNOSIS — M6281 Muscle weakness (generalized): Secondary | ICD-10-CM

## 2020-04-07 DIAGNOSIS — R262 Difficulty in walking, not elsewhere classified: Secondary | ICD-10-CM

## 2020-04-07 DIAGNOSIS — G8929 Other chronic pain: Secondary | ICD-10-CM

## 2020-04-07 NOTE — Therapy (Signed)
[facility] [address], [place], [address] Phone: [phone]   Fax:  [phone]  Physical Therapy Treatment  Patient Details  Name: Destiny Shaw MRN: [id number] Date of Birth: [birth year] Referring Provider (PT): Paralee Cancel, MD   Encounter Date: 04/07/2020   PT End of Session - 04/07/20 1435    Visit Number 9    Number of Visits 25    Date for PT Re-Evaluation 05/26/20    Authorization - Visit Number 9    Authorization - Number of Visits 10    PT Start Time 9509    PT Stop Time 1456    PT Time Calculation (min) 40 min    Activity Tolerance Patient tolerated treatment well    Behavior During Therapy [facility] for tasks assessed/performed           Past Medical History:  Diagnosis Date  . ADD (attention deficit disorder)   . Anemia   . Anginal pain ([place])    pt states has occas chest pain relates to indigestion; pt uses rest to relieve DR [place]-   . Anxiety   . Arthritis   . Concussion   . Depression   . Diabetes mellitus without complication ([place])    type 2   . Dizziness   . Fall   . GERD (gastroesophageal reflux disease)   . Headache   . History of urinary tract infection   . Hyperlipidemia   . Hypertension   . IBS (irritable bowel syndrome)   . Imbalance   . Numbness    right leg   . Numbness in both hands    comes and goes   . Pneumonia    last episode approx 1 year ago   . Sleep apnea    uses cpap not all the time   . Wears glasses     Past Surgical History:  Procedure Laterality Date  . BREAST CYST ASPIRATION Left 1980's   neg  . BREAST CYST EXCISION Right 1980's   neg  . BREAST LUMPECTOMY Right   . BREAST SURGERY    . CARPAL TUNNEL RELEASE    . CESAREAN SECTION     times 2  . COLONOSCOPY WITH PROPOFOL N/A 05/11/2015   Procedure: COLONOSCOPY WITH PROPOFOL;  Surgeon: Manya Silvas, MD;  Location: [facility] ENDOSCOPY;  Service: Endoscopy;  Laterality:  N/A;  . DE QUERVAIN'S RELEASE Right   . ESOPHAGOGASTRODUODENOSCOPY (EGD) WITH PROPOFOL N/A 05/11/2015   Procedure: ESOPHAGOGASTRODUODENOSCOPY (EGD) WITH PROPOFOL;  Surgeon: Manya Silvas, MD;  Location[facility] ENDOSCOPY;  Service: Endoscopy;  Laterality: N/A;  . EYE SURGERY     laser surgery bilat   . HERNIA REPAIR    . KNEE ARTHROSCOPY    . LUMBAR LAMINECTOMY/DECOMPRESSION MICRODISCECTOMY Bilateral 09/14/2015   Procedure: MICRO LUMBAR BILATERAL DECOMPRESSION L4 - L5;  Surgeon: Susa Day, MD;  Location: [facility];  Service: Orthopedics;  Laterality: Bilateral;  . REDUCTION MAMMAPLASTY Bilateral 1980  . RHINOPLASTY    . TONSILLECTOMY    . TOTAL KNEE ARTHROPLASTY Left 06/18/2016   Procedure: LEFT TOTAL KNEE ARTHROPLASTY;  Surgeon: Paralee Cancel, MD;  Location: [facility] ORS;  Service: Orthopedics;  Laterality: Left;  Adductor Block  . TOTAL KNEE ARTHROPLASTY Right 02/11/2020   Procedure: TOTAL KNEE ARTHROPLASTY;  Surgeon: Paralee Cancel, MD;  Location: [facility] ORS;  Service: Orthopedics;  Laterality: Right;  . TUBAL LIGATION    . UVULOPALATOPHARYNGOPLASTY  There were no vitals filed for this visit.   Subjective Assessment - 04/07/20 1433    Subjective Rt knee conitnues to improve in general, reports still some soreness from excessive activity last week.    Pertinent History S/P R TKA on 02/11/2020. Pt is currently 3 weeks post op. R knee does not feel great. Has not had PT since leaving the hospital. Has been doing ankle pumps as well as walking with her RW.    Patient Stated Goals Be better able to get into and out of her vehicle (midsized truck), into and out of chairs, be better able to roll in her bed with less L knee pain. Be able to get down on the floor and get back up.     Currently in Pain? Yes    Pain Score 6            INTERVENTION THIS DATE:  -scar massage, education on self scar massage at home  special attention to stay away from last remaining scabby area (<29mm) -supine AP  distal femur on tibial mobilization 3x30sec, grade III -Supine SAQ 4x10x5secH (imporved extension to 15 degrees)  -STS from slight elevation 2x10 hands free -standing Rt knee flexion butt kicks 2x15 -AMB c RW 158ft, 63sec, good symmetry   *Flexion ROM to 115 degrees     PT Short Term Goals - 04/07/20 1440      PT SHORT TERM GOAL #1   Title Patient will be independent with her initial HEP to improve R knee ROM, strength, and decrease difficulty with gait.    Baseline Pt has started her HEP (03/03/2020)    Time 3    Period Weeks    Status Achieved    Target Date 03/24/20             PT Long Term Goals - 04/07/20 1440      PT LONG TERM GOAL #1   Title Patient will improve R knee flexion AROM to 120 degrees to promote ability to ambulate and negotiate stairs with less difficulty.    Baseline 76 degrees setaed R knee flexion AROM (03/03/2020); 115 degrees flexion 04/07/20    Time 12    Period Weeks    Status On-going    Target Date 05/26/20      PT LONG TERM GOAL #2   Title Patient will improve R knee extension AROM to -5 degrees or more to promote ability to ambulate as well as perform standing tasks with less difficulty.    Baseline -28 degrees R knee extension AROM (03/03/2020); 15 degrees on 04/07/20    Time 12    Period Weeks    Status On-going    Target Date 05/26/20      PT LONG TERM GOAL #3   Title Pt will improve R knee flexion and extension strength to 5/5 to promote ability to perform standing tasks with less difficulty.    Baseline 4-/5 R knee flexion and 4/5 extension (03/03/2020)    Time 12    Period Weeks    Status On-going    Target Date 05/27/19      PT LONG TERM GOAL #4   Title Pt will be able to ambulate at least 300 ft independently and without LOB to promote mobility.    Baseline Pt currently ambulating with rw (03/03/2020);    Time 12    Period Weeks    Status Achieved    Target Date 05/26/20      PT LONG TERM  GOAL #5   Title Pt will improve her R knee  FOTO score by at least 10 points as a demonstration of improved function.    Baseline Pt was unable to fill out FOTO at eval (03/03/2020)    Time 12    Period Weeks    Status On-going    Target Date 05/26/20                 Plan - 04/07/20 1438    Clinical Impression Statement Reassessment this date in anticipation of 10th visit. Pt will complete FOTO survey next visit. Continued with current plan of care as laid out in evaluation and recent prior sessions. Pt remains motivated to advance progress toward goals. Rest breaks provided as needed, pt quick to ask when needed. Author maintains all interventions within appropriate level of intensity as not to purposefully exacerbate pain. Pt does require varying levels of assistance and cuing for completion of exercises for correct form and sometimes due to pain/weakness. Pt continues to demonstrate progress toward goals AEB progression of some interventions this date either in volume or intensity. No updates to HEP this date.    Personal Factors and Comorbidities Comorbidity 3+;Age;Fitness;Past/Current Experience    Comorbidities DM, dizziness, HTN, memory    Examination-Activity Limitations Squat;Stairs;Bed Mobility;Bend;Locomotion Level;Stand;Carry;Transfers    Stability/Clinical Decision Making Stable/Uncomplicated    Clinical Decision Making Low    Rehab Potential Fair    Clinical Impairments Affecting Rehab Potential (-) multiple areas of chronic pain, age, memory. (+) motivated    PT Frequency 2x / week    PT Duration 12 weeks    PT Treatment/Interventions Therapeutic activities;Therapeutic exercise;Balance training;Neuromuscular re-education;Patient/family education;Manual techniques;Dry needling;Vestibular;Aquatic Therapy;Canalith Repostioning;Electrical Stimulation;Iontophoresis 4mg /ml Dexamethasone;Gait training;Stair training    PT Next Visit Plan ROM, hip and knee strengthening, gait, manual techniques, modalities PRN    PT Home  Exercise Plan Medbridge Access Code: [email or web address]    Consulted and Agree with Plan of Care Patient           Patient will benefit from skilled therapeutic intervention in order to improve the following deficits and impairments:  Pain,Improper body mechanics,Postural dysfunction,Difficulty walking,Decreased strength,Decreased scar mobility,Decreased range of motion,Abnormal gait  Visit Diagnosis: Chronic pain of right knee  Difficulty in walking, not elsewhere classified  Muscle weakness (generalized)     Problem List Patient Active Problem List   Diagnosis Date Noted  . Osteoarthritis of right knee 02/12/2020  . S/P total knee arthroplasty, right 02/11/2020  . Obese 06/20/2016  . Status post right knee replacement 06/19/2016  . S/P left TKA 06/18/2016  . Spinal stenosis of lumbar region 09/14/2015   2:57 PM, 04/07/20 Etta Grandchild, PT, DPT Physical Therapist - [place] [phone] (Office)    Shatha Hooser C 04/07/2020, 2:51 PM  [facility] [address], [place], [address] Phone: [phone]   Fax:  [phone]  Name: MELLA INCLAN MRN: [id number] Date of Birth: 1950-03-16

## 2020-04-12 ENCOUNTER — Other Ambulatory Visit: Payer: Self-pay

## 2020-04-12 ENCOUNTER — Ambulatory Visit: Payer: Medicare Other

## 2020-04-12 DIAGNOSIS — R262 Difficulty in walking, not elsewhere classified: Secondary | ICD-10-CM

## 2020-04-12 DIAGNOSIS — G8929 Other chronic pain: Secondary | ICD-10-CM

## 2020-04-12 DIAGNOSIS — M25561 Pain in right knee: Secondary | ICD-10-CM | POA: Diagnosis not present

## 2020-04-12 DIAGNOSIS — M6281 Muscle weakness (generalized): Secondary | ICD-10-CM

## 2020-04-12 NOTE — Therapy (Signed)
[facility] [address], [place], [address] Phone: [phone]   Fax:  [phone]  Physical Therapy Treatment And Progress Report (03/03/2020 - 04/12/2020)  Patient Details  Name: Destiny Shaw MRN: [id number] Date of Birth: 31-Jul-1950 Referring Provider (PT): Paralee Cancel, MD   Encounter Date: 04/12/2020   PT End of Session - 04/12/20 1303    Visit Number 10    Number of Visits 25    Date for PT Re-Evaluation 05/26/20    Authorization - Visit Number 10    Authorization - Number of Visits 10    PT Start Time 4128    PT Stop Time 1350    PT Time Calculation (min) 46 min    Activity Tolerance Patient tolerated treatment well    Behavior During Therapy [facility] for tasks assessed/performed           Past Medical History:  Diagnosis Date  . ADD (attention deficit disorder)   . Anemia   . Anginal pain ([place])    pt states has occas chest pain relates to indigestion; pt uses rest to relieve DR [place]-   . Anxiety   . Arthritis   . Concussion   . Depression   . Diabetes mellitus without complication ([place])    type 2   . Dizziness   . Fall   . GERD (gastroesophageal reflux disease)   . Headache   . History of urinary tract infection   . Hyperlipidemia   . Hypertension   . IBS (irritable bowel syndrome)   . Imbalance   . Numbness    right leg   . Numbness in both hands    comes and goes   . Pneumonia    last episode approx 1 year ago   . Sleep apnea    uses cpap not all the time   . Wears glasses     Past Surgical History:  Procedure Laterality Date  . BREAST CYST ASPIRATION Left 1980's   neg  . BREAST CYST EXCISION Right 1980's   neg  . BREAST LUMPECTOMY Right   . BREAST SURGERY    . CARPAL TUNNEL RELEASE    . CESAREAN SECTION     times 2  . COLONOSCOPY WITH PROPOFOL N/A 05/11/2015   Procedure: COLONOSCOPY WITH PROPOFOL;  Surgeon: Manya Silvas, MD;  Location:  [facility] ENDOSCOPY;  Service: Endoscopy;  Laterality: N/A;  . DE QUERVAIN'S RELEASE Right   . ESOPHAGOGASTRODUODENOSCOPY (EGD) WITH PROPOFOL N/A 05/11/2015   Procedure: ESOPHAGOGASTRODUODENOSCOPY (EGD) WITH PROPOFOL;  Surgeon: Manya Silvas, MD;  Location: [facility] ENDOSCOPY;  Service: Endoscopy;  Laterality: N/A;  . EYE SURGERY     laser surgery bilat   . HERNIA REPAIR    . KNEE ARTHROSCOPY    . LUMBAR LAMINECTOMY/DECOMPRESSION MICRODISCECTOMY Bilateral 09/14/2015   Procedure: MICRO LUMBAR BILATERAL DECOMPRESSION L4 - L5;  Surgeon: Susa Day, MD;  Location: [facility] ORS;  Service: Orthopedics;  Laterality: Bilateral;  . REDUCTION MAMMAPLASTY Bilateral 1980  . RHINOPLASTY    . TONSILLECTOMY    . TOTAL KNEE ARTHROPLASTY Left 06/18/2016   Procedure: LEFT TOTAL KNEE ARTHROPLASTY;  Surgeon: Paralee Cancel, MD;  Location: [facility] ORS;  Service: Orthopedics;  Laterality: Left;  Adductor Block  . TOTAL KNEE ARTHROPLASTY Right 02/11/2020   Procedure: TOTAL KNEE ARTHROPLASTY;  Surgeon: Paralee Cancel, MD;  Location: [facility] ORS;  Service: Orthopedics;  Laterality: Right;  . TUBAL LIGATION    .  UVULOPALATOPHARYNGOPLASTY      There were no vitals filed for this visit.   Subjective Assessment - 04/12/20 1306    Subjective R knee is fine right now. Her son Merrily Pew has been helping her walk with a cane. Used to be able to remember things a lot better.    Pertinent History S/P R TKA on 02/11/2020. Pt is currently 3 weeks post op. R knee does not feel great. Has not had PT since leaving the hospital. Has been doing ankle pumps as well as walking with her RW.    Patient Stated Goals Be better able to get into and out of her vehicle (midsized truck), into and out of chairs, be better able to roll in her bed with less L knee pain. Be able to get down on the floor and get back up.     Currently in Pain? No/denies                                   Objective   Swelling, scab formation, healing  satisfactorily  s/p R TKR12/16/2021   S/P 8-9 wks  MedbridgeAccess Code [email or web address]   Manual therapy   Seated STM Rmedial andlateralhamstringsto decreasemuscletension  Seated STM R anterior kneeto decreasefascial stiffness  Therapeutic Exercise  Gait with SPC 100 ft Sit <> stand from regular chair with arms without UE assist 10x  Able to perform without UE assist 8x Standing ankle DF/PF on rocker board with B UE assist 2 min   Seated knee AROM  Flexion   Extension -11 degrees  Seated manually resisted R knee flexion and extension 1x each way  SLS with B UE assist   R 10x5 seconds (easy), then with L UE assist only 10x5 seconds  Forward step up onto 4 inch step with L UE assist   R 10x3  Lateral step up onto 4 inch step with B UE assist   R 8x. Discomfort, eases with rest.   Side stepping 30 ft to the L and 30 ft to the R    Improved exercise technique, movement at target joints, use of target muscles after mod verbal, visual, tactile cues.   Response to treatment Pt states R knee feels better after session.   Clinical impresssion Pt demonstrates improving R knee flexion and extension AROM, decreasing pain levels reported, improved quadriceps and hamstrings strength, and has transitioned from ambulating with rw to [facility]. Pt making good progress with PT towards goals. Pt will benefit from continued skilled physical therapy services to improve ROM, strength, function, and ability to ambulate with less difficulty.     PT Education - 04/12/20 1311    Education Details ther-ex    Person(s) Educated Patient    Methods Explanation;Demonstration;Tactile cues;Verbal cues    Comprehension Returned demonstration;Verbalized understanding           PT Short Term Goals - 04/07/20 1440      PT SHORT TERM GOAL #1   Title Patient will be independent with her initial HEP to improve R knee ROM, strength, and decrease difficulty with gait.     Baseline Pt has started her HEP (03/03/2020)    Time 3    Period Weeks    Status Achieved    Target Date 03/24/20             PT Long Term Goals - 04/12/20 1312      PT LONG TERM GOAL #  1   Title Patient will improve R knee flexion AROM to 120 degrees to promote ability to ambulate and negotiate stairs with less difficulty.    Baseline 76 degrees setaed R knee flexion AROM (03/03/2020); 115 degrees flexion 04/07/20; 115 degrees (04/12/2020)    Time 12    Period Weeks    Status Partially Met    Target Date 05/26/20      PT LONG TERM GOAL #2   Title Patient will improve R knee extension AROM to -5 degrees or more to promote ability to ambulate as well as perform standing tasks with less difficulty.    Baseline -28 degrees R knee extension AROM (03/03/2020); -15 degrees on 04/07/20; -11 degrees (04/12/2020)    Time 12    Period Weeks    Status On-going    Target Date 05/26/20      PT LONG TERM GOAL #3   Title Pt will improve R knee flexion and extension strength to 5/5 to promote ability to perform standing tasks with less difficulty.    Baseline 4-/5 R knee flexion and 4/5 extension (03/03/2020); 5/5 R knee flexion, 5/5 knee extension (04/12/2020)    Time 12    Period Weeks    Status Achieved    Target Date 05/26/20      PT LONG TERM GOAL #4   Title Pt will be able to ambulate at least 300 ft independently and without LOB to promote mobility.    Baseline Pt currently ambulating with rw (03/03/2020); Pt currently ambulating with SPC (04/12/2020)    Time 12    Period Weeks    Status Partially Met    Target Date 05/26/20      PT LONG TERM GOAL #5   Title Pt will improve her R knee FOTO score by at least 10 points as a demonstration of improved function.    Baseline Pt was unable to fill out FOTO at eval (03/03/2020); FOTO not appropriate secondary to memory challenges (04/12/2020)    Time 12    Period Weeks    Status On-going    Target Date 05/26/20                 Plan -  04/12/20 1311    Clinical Impression Statement Pt demonstrates improving R knee flexion and extension AROM, decreasing pain levels reported, improved quadriceps and hamstrings strength, and has transitioned from ambulating with rw to [facility]. Pt making good progress with PT towards goals. Pt will benefit from continued skilled physical therapy services to improve ROM, strength, function, and ability to ambulate with less difficulty.    Personal Factors and Comorbidities Comorbidity 3+;Age;Fitness;Past/Current Experience    Comorbidities DM, dizziness, HTN, memory    Examination-Activity Limitations Squat;Stairs;Bed Mobility;Bend;Locomotion Level;Stand;Carry;Transfers    Stability/Clinical Decision Making Stable/Uncomplicated    Clinical Decision Making Low    Rehab Potential Fair    Clinical Impairments Affecting Rehab Potential (-) multiple areas of chronic pain, age, memory. (+) motivated    PT Frequency 2x / week    PT Duration 12 weeks    PT Treatment/Interventions Therapeutic activities;Therapeutic exercise;Balance training;Neuromuscular re-education;Patient/family education;Manual techniques;Dry needling;Vestibular;Aquatic Therapy;Canalith Repostioning;Electrical Stimulation;Iontophoresis 7m/ml Dexamethasone;Gait training;Stair training    PT Next Visit Plan ROM, hip and knee strengthening, gait, manual techniques, modalities PRN    PT Home Exercise Plan Medbridge Access Code [email or web address]   Consulted and Agree with Plan of Care Patient           Patient will benefit from skilled therapeutic intervention  in order to improve the following deficits and impairments:  Pain,Improper body mechanics,Postural dysfunction,Difficulty walking,Decreased strength,Decreased scar mobility,Decreased range of motion,Abnormal gait  Visit Diagnosis: Chronic pain of right knee  Difficulty in walking, not elsewhere classified  Muscle weakness (generalized)     Problem List Patient Active Problem List    Diagnosis Date Noted  . Osteoarthritis of right knee 02/12/2020  . S/P total knee arthroplasty, right 02/11/2020  . Obese 06/20/2016  . Status post right knee replacement 06/19/2016  . S/P left TKA 06/18/2016  . Spinal stenosis of lumbar region 09/14/2015    Thank you for your referral.  Joneen Boers PT, DPT   04/12/2020, 2:00 PM  [facility] [address], [place], [address] Phone: [phone]   Fax:  [phone]  Name: Destiny Shaw MRN: [id number] Date of Birth: November 08, 1950

## 2020-04-14 ENCOUNTER — Ambulatory Visit: Payer: Medicare Other

## 2020-04-14 ENCOUNTER — Other Ambulatory Visit: Payer: Self-pay

## 2020-04-14 DIAGNOSIS — G8929 Other chronic pain: Secondary | ICD-10-CM

## 2020-04-14 DIAGNOSIS — M6281 Muscle weakness (generalized): Secondary | ICD-10-CM

## 2020-04-14 DIAGNOSIS — M25561 Pain in right knee: Secondary | ICD-10-CM | POA: Diagnosis not present

## 2020-04-14 DIAGNOSIS — R262 Difficulty in walking, not elsewhere classified: Secondary | ICD-10-CM

## 2020-04-14 NOTE — Therapy (Signed)
[facility] [address] [place], [address] Phone: [phone]   Fax:  [phone]  Physical Therapy Treatment  Patient Details  Name: Destiny Shaw MRN: [id number] Date of Birth: 1950-05-17 Referring Provider (PT): Paralee Cancel, MD   Encounter Date: 04/14/2020   PT End of Session - 04/14/20 1305    Visit Number 11    Number of Visits 25    Date for PT Re-Evaluation 05/26/20    Authorization - Visit Number 1    Authorization - Number of Visits 10    PT Start Time 4315    PT Stop Time 1346    PT Time Calculation (min) 41 min    Activity Tolerance Patient tolerated treatment well    Behavior During Therapy [facility] for tasks assessed/performed           Past Medical History:  Diagnosis Date  . ADD (attention deficit disorder)   . Anemia   . Anginal pain ([place])    pt states has occas chest pain relates to indigestion; pt uses rest to relieve DR [place]-   . Anxiety   . Arthritis   . Concussion   . Depression   . Diabetes mellitus without complication ([place])    type 2   . Dizziness   . Fall   . GERD (gastroesophageal reflux disease)   . Headache   . History of urinary tract infection   . Hyperlipidemia   . Hypertension   . IBS (irritable bowel syndrome)   . Imbalance   . Numbness    right leg   . Numbness in both hands    comes and goes   . Pneumonia    last episode approx 1 year ago   . Sleep apnea    uses cpap not all the time   . Wears glasses     Past Surgical History:  Procedure Laterality Date  . BREAST CYST ASPIRATION Left 1980's   neg  . BREAST CYST EXCISION Right 1980's   neg  . BREAST LUMPECTOMY Right   . BREAST SURGERY    . CARPAL TUNNEL RELEASE    . CESAREAN SECTION     times 2  . COLONOSCOPY WITH PROPOFOL N/A 05/11/2015   Procedure: COLONOSCOPY WITH PROPOFOL;  Surgeon: Manya Silvas, MD;  Location: [facility] ENDOSCOPY;  Service: Endoscopy;   Laterality: N/A;  . DE QUERVAIN'S RELEASE Right   . ESOPHAGOGASTRODUODENOSCOPY (EGD) WITH PROPOFOL N/A 05/11/2015   Procedure: ESOPHAGOGASTRODUODENOSCOPY (EGD) WITH PROPOFOL;  Surgeon: Manya Silvas, MD;  Location: [facility] ENDOSCOPY;  Service: Endoscopy;  Laterality: N/A;  . EYE SURGERY     laser surgery bilat   . HERNIA REPAIR    . KNEE ARTHROSCOPY    . LUMBAR LAMINECTOMY/DECOMPRESSION MICRODISCECTOMY Bilateral 09/14/2015   Procedure: MICRO LUMBAR BILATERAL DECOMPRESSION L4 - L5;  Surgeon: Susa Day, MD;  Location: [facility]S;  Service: Orthopedics;  Laterality: Bilateral;  . REDUCTION MAMMAPLASTY Bilateral 1980  . RHINOPLASTY    . TONSILLECTOMY    . TOTAL KNEE ARTHROPLASTY Left 06/18/2016   Procedure: LEFT TOTAL KNEE ARTHROPLASTY;  Surgeon: Paralee Cancel, MD;  Location: [facility]S;  Service: Orthopedics;  Laterality: Left;  Adductor Block  . TOTAL KNEE ARTHROPLASTY Right 02/11/2020   Procedure: TOTAL KNEE ARTHROPLASTY;  Surgeon: Paralee Cancel, MD;  Location: [facility]S;  Service: Orthopedics;  Laterality: Right;  . TUBAL LIGATION    . UVULOPALATOPHARYNGOPLASTY  There were no vitals filed for this visit.   Subjective Assessment - 04/14/20 1312    Subjective No R knee pain currently.    Pertinent History S/P R TKA on 02/11/2020. Pt is currently 3 weeks post op. R knee does not feel great. Has not had PT since leaving the hospital. Has been doing ankle pumps as well as walking with her RW.    Patient Stated Goals Be better able to get into and out of her vehicle (midsized truck), into and out of chairs, be better able to roll in her bed with less L knee pain. Be able to get down on the floor and get back up.     Currently in Pain? No/denies    Pain Score 0-No pain                                     PT Education - 04/14/20 1325    Education Details ther-ex    Person(s) Educated Patient    Methods Explanation;Demonstration;Tactile cues;Verbal cues    Comprehension  Returned demonstration;Verbalized understanding          Objective   Swelling, scab formation, healing satisfactorily  s/p R TKR12/16/2021   S/P 9 wks  MedbridgeAccess Code [email or web address]   Manual therapy Seated STM R anterior kneeto decreasefascial stiffness  Therapeutic Exercise  Gait with SPC 200 ft  Cues for knee flexion during swing phase  Sit <> stand from regular chair with arms without UE assist 10x  Able to perform without UE assist 9x  SLS with B UE assist              R 8x5 seconds with L UE assist only   Standing ankle DF/PF on rocker board with B UE assist 2 min    Forward step up onto 4 inch step with L UE assist              R 10x3  Side stepping 32 ft to the L and 32 ft to the R      Improved exercise technique, movement at target joints, use of target muscles after mod verbal, visual, tactile cues.   Response to treatment Pt states R knee feels better after session.   Clinical impresssion Continued working on R LE general strengthening to promote ability to perform transfers and ambulate with less difficulty. Improved fascial mobilithy R knee after treatment with improved comfort level reported. Pt will benefit from continued skilled physical therapy services to improve ROM, strength and function.       PT Short Term Goals - 04/07/20 1440      PT SHORT TERM GOAL #1   Title Patient will be independent with her initial HEP to improve R knee ROM, strength, and decrease difficulty with gait.    Baseline Pt has started her HEP (03/03/2020)    Time 3    Period Weeks    Status Achieved    Target Date 03/24/20             PT Long Term Goals - 04/12/20 1312      PT LONG TERM GOAL #1   Title Patient will improve R knee flexion AROM to 120 degrees to promote ability to ambulate and negotiate stairs with less difficulty.    Baseline 76 degrees setaed R knee flexion AROM (03/03/2020); 115 degrees flexion 04/07/20; 115 degrees  (04/12/2020)    Time 12  Period Weeks    Status Partially Met    Target Date 05/26/20      PT LONG TERM GOAL #2   Title Patient will improve R knee extension AROM to -5 degrees or more to promote ability to ambulate as well as perform standing tasks with less difficulty.    Baseline -28 degrees R knee extension AROM (03/03/2020); -15 degrees on 04/07/20; -11 degrees (04/12/2020)    Time 12    Period Weeks    Status On-going    Target Date 05/26/20      PT LONG TERM GOAL #3   Title Pt will improve R knee flexion and extension strength to 5/5 to promote ability to perform standing tasks with less difficulty.    Baseline 4-/5 R knee flexion and 4/5 extension (03/03/2020); 5/5 R knee flexion, 5/5 knee extension (04/12/2020)    Time 12    Period Weeks    Status Achieved    Target Date 05/26/20      PT LONG TERM GOAL #4   Title Pt will be able to ambulate at least 300 ft independently and without LOB to promote mobility.    Baseline Pt currently ambulating with rw (03/03/2020); Pt currently ambulating with SPC (04/12/2020)    Time 12    Period Weeks    Status Partially Met    Target Date 05/26/20      PT LONG TERM GOAL #5   Title Pt will improve her R knee FOTO score by at least 10 points as a demonstration of improved function.    Baseline Pt was unable to fill out FOTO at eval (03/03/2020); FOTO not appropriate secondary to memory challenges (04/12/2020)    Time 12    Period Weeks    Status On-going    Target Date 05/26/20                 Plan - 04/14/20 1326    Clinical Impression Statement Continued working on R LE general strengthening to promote ability to perform transfers and ambulate with less difficulty. Improved fascial mobilithy R knee after treatment with improved comfort level reported. Pt will benefit from continued skilled physical therapy services to improve ROM, strength and function.    Personal Factors and Comorbidities Comorbidity 3+;Age;Fitness;Past/Current  Experience    Comorbidities DM, dizziness, HTN, memory    Examination-Activity Limitations Squat;Stairs;Bed Mobility;Bend;Locomotion Level;Stand;Carry;Transfers    Stability/Clinical Decision Making Stable/Uncomplicated    Rehab Potential Fair    Clinical Impairments Affecting Rehab Potential (-) multiple areas of chronic pain, age, memory. (+) motivated    PT Frequency 2x / week    PT Duration 12 weeks    PT Treatment/Interventions Therapeutic activities;Therapeutic exercise;Balance training;Neuromuscular re-education;Patient/family education;Manual techniques;Dry needling;Vestibular;Aquatic Therapy;Canalith Repostioning;Electrical Stimulation;Iontophoresis 33m/ml Dexamethasone;Gait training;Stair training    PT Next Visit Plan ROM, hip and knee strengthening, gait, manual techniques, modalities PRN    PT Home Exercise Plan Medbridge Access Code [email or web address]   Consulted and Agree with Plan of Care Patient           Patient will benefit from skilled therapeutic intervention in order to improve the following deficits and impairments:  Pain,Improper body mechanics,Postural dysfunction,Difficulty walking,Decreased strength,Decreased scar mobility,Decreased range of motion,Abnormal gait  Visit Diagnosis: Chronic pain of right knee  Difficulty in walking, not elsewhere classified  Muscle weakness (generalized)     Problem List Patient Active Problem List   Diagnosis Date Noted  . Osteoarthritis of right knee 02/12/2020  . S/P total knee arthroplasty, right  02/11/2020  . Obese 06/20/2016  . Status post right knee replacement 06/19/2016  . S/P left TKA 06/18/2016  . Spinal stenosis of lumbar region 09/14/2015   Joneen Boers PT, DPT   04/14/2020, 2:41 PM  [facility] [address] [place], [address] Phone: [phone]   Fax:  [phone]  Name: Destiny Shaw MRN: [id number] Date of Birth: 05/03/1950

## 2020-04-19 ENCOUNTER — Other Ambulatory Visit: Payer: Self-pay

## 2020-04-19 ENCOUNTER — Ambulatory Visit: Payer: Medicare Other

## 2020-04-19 DIAGNOSIS — M25561 Pain in right knee: Secondary | ICD-10-CM | POA: Diagnosis not present

## 2020-04-19 DIAGNOSIS — G8929 Other chronic pain: Secondary | ICD-10-CM

## 2020-04-19 DIAGNOSIS — R262 Difficulty in walking, not elsewhere classified: Secondary | ICD-10-CM

## 2020-04-19 DIAGNOSIS — M6281 Muscle weakness (generalized): Secondary | ICD-10-CM

## 2020-04-19 NOTE — Therapy (Signed)
[facility] [address] [place], [address] Phone: [phone]   Fax:  [phone]  Physical Therapy Treatment  Patient Details  Name: Destiny Shaw MRN: [id number] Date of Birth: 02/28/1950 Referring Provider (PT): Paralee Cancel, MD   Encounter Date: 04/19/2020   PT End of Session - 04/19/20 1304    Visit Number 12    Number of Visits 25    Date for PT Re-Evaluation 05/26/20    Authorization - Visit Number 2    Authorization - Number of Visits 10    PT Start Time 1304    PT Stop Time 1344    PT Time Calculation (min) 40 min    Activity Tolerance Patient tolerated treatment well    Behavior During Therapy [facility] for tasks assessed/performed           Past Medical History:  Diagnosis Date  . ADD (attention deficit disorder)   . Anemia   . Anginal pain ([place])    pt states has occas chest pain relates to indigestion; pt uses rest to relieve DR [place]-   . Anxiety   . Arthritis   . Concussion   . Depression   . Diabetes mellitus without complication ([place])    type 2   . Dizziness   . Fall   . GERD (gastroesophageal reflux disease)   . Headache   . History of urinary tract infection   . Hyperlipidemia   . Hypertension   . IBS (irritable bowel syndrome)   . Imbalance   . Numbness    right leg   . Numbness in both hands    comes and goes   . Pneumonia    last episode approx 1 year ago   . Sleep apnea    uses cpap not all the time   . Wears glasses     Past Surgical History:  Procedure Laterality Date  . BREAST CYST ASPIRATION Left 1980's   neg  . BREAST CYST EXCISION Right 1980's   neg  . BREAST LUMPECTOMY Right   . BREAST SURGERY    . CARPAL TUNNEL RELEASE    . CESAREAN SECTION     times 2  . COLONOSCOPY WITH PROPOFOL N/A 05/11/2015   Procedure: COLONOSCOPY WITH PROPOFOL;  Surgeon: Manya Silvas, MD;  Location: [facility] ENDOSCOPY;  Service: Endoscopy;   Laterality: N/A;  . DE QUERVAIN'S RELEASE Right   . ESOPHAGOGASTRODUODENOSCOPY (EGD) WITH PROPOFOL N/A 05/11/2015   Procedure: ESOPHAGOGASTRODUODENOSCOPY (EGD) WITH PROPOFOL;  Surgeon: Manya Silvas, MD;  Location: [facility] ENDOSCOPY;  Service: Endoscopy;  Laterality: N/A;  . EYE SURGERY     laser surgery bilat   . HERNIA REPAIR    . KNEE ARTHROSCOPY    . LUMBAR LAMINECTOMY/DECOMPRESSION MICRODISCECTOMY Bilateral 09/14/2015   Procedure: MICRO LUMBAR BILATERAL DECOMPRESSION L4 - L5;  Surgeon: Susa Day, MD;  Location: [facility] ORS;  Service: Orthopedics;  Laterality: Bilateral;  . REDUCTION MAMMAPLASTY Bilateral 1980  . RHINOPLASTY    . TONSILLECTOMY    . TOTAL KNEE ARTHROPLASTY Left 06/18/2016   Procedure: LEFT TOTAL KNEE ARTHROPLASTY;  Surgeon: Paralee Cancel, MD;  Location: [facility] ORS;  Service: Orthopedics;  Laterality: Left;  Adductor Block  . TOTAL KNEE ARTHROPLASTY Right 02/11/2020   Procedure: TOTAL KNEE ARTHROPLASTY;  Surgeon: Paralee Cancel, MD;  Location: [facility] ORS;  Service: Orthopedics;  Laterality: Right;  . TUBAL LIGATION    . UVULOPALATOPHARYNGOPLASTY  There were no vitals filed for this visit.   Subjective Assessment - 04/19/20 1305    Subjective Forgot her AD today. 5/10 R knee pain currently.    Pertinent History S/P R TKA on 02/11/2020. Pt is currently 3 weeks post op. R knee does not feel great. Has not had PT since leaving the hospital. Has been doing ankle pumps as well as walking with her RW.    Patient Stated Goals Be better able to get into and out of her vehicle (midsized truck), into and out of chairs, be better able to roll in her bed with less L knee pain. Be able to get down on the floor and get back up.     Currently in Pain? Yes    Pain Score 5                                      PT Education - 04/19/20 1313    Education Details ther-ex    Person(s) Educated Patient    Methods Explanation;Demonstration;Tactile cues;Verbal cues     Comprehension Returned demonstration;Verbalized understanding          Objective   Swelling, scab formation, healing satisfactorily  s/p R TKR12/16/2021   S/P9wks  MedbridgeAccess Code [email or web address]   Manual therapy Seated STM R anterior kneeto decreasefascial stiffness seated STM R vastus lataralis as well as medial hamstings to decrease muscle tension    Therapeutic Exercise  Gait without AD 100 ft. No LOB or unsteadiness. Pt able to ambulate short distances fine.   Standing heel toe raises with B UE assist 10x3              Standing ankle DF/PF on rocker board with B UE assist 2 min     Forward step up onto 4 inch step with L UE assist  R 10x  Forward step up onto and over with R LE and L UE assist 10x  Ascending and descending 4 regular steps with B UE assist, reciprocal pattern 2x  SLS R LE with B UE assist 10x2 with 5 second holds   Improved exercise technique, movement at target joints, use of target muscles after mod verbal, visual, tactile cues.   Response to treatment Good tolerance to today's session.   Clinical impresssion Continued working on R LE strengthening to promote function and stair negotiation. Pt also able to ambulate safely without AD. Pt tolerated session well without aggravation of symptoms. Pt will benefit from continued skilled physical therapy services to improve ROM strength and function.       PT Short Term Goals - 04/07/20 1440      PT SHORT TERM GOAL #1   Title Patient will be independent with her initial HEP to improve R knee ROM, strength, and decrease difficulty with gait.    Baseline Pt has started her HEP (03/03/2020)    Time 3    Period Weeks    Status Achieved    Target Date 03/24/20             PT Long Term Goals - 04/12/20 1312      PT LONG TERM GOAL #1   Title Patient will improve R knee flexion AROM to 120 degrees to promote ability to ambulate and negotiate stairs with less  difficulty.    Baseline 76 degrees setaed R knee flexion AROM (03/03/2020); 115 degrees flexion 04/07/20; 115 degrees (04/12/2020)  Time 12    Period Weeks    Status Partially Met    Target Date 05/26/20      PT LONG TERM GOAL #2   Title Patient will improve R knee extension AROM to -5 degrees or more to promote ability to ambulate as well as perform standing tasks with less difficulty.    Baseline -28 degrees R knee extension AROM (03/03/2020); -15 degrees on 04/07/20; -11 degrees (04/12/2020)    Time 12    Period Weeks    Status On-going    Target Date 05/26/20      PT LONG TERM GOAL #3   Title Pt will improve R knee flexion and extension strength to 5/5 to promote ability to perform standing tasks with less difficulty.    Baseline 4-/5 R knee flexion and 4/5 extension (03/03/2020); 5/5 R knee flexion, 5/5 knee extension (04/12/2020)    Time 12    Period Weeks    Status Achieved    Target Date 05/26/20      PT LONG TERM GOAL #4   Title Pt will be able to ambulate at least 300 ft independently and without LOB to promote mobility.    Baseline Pt currently ambulating with rw (03/03/2020); Pt currently ambulating with SPC (04/12/2020)    Time 12    Period Weeks    Status Partially Met    Target Date 05/26/20      PT LONG TERM GOAL #5   Title Pt will improve her R knee FOTO score by at least 10 points as a demonstration of improved function.    Baseline Pt was unable to fill out FOTO at eval (03/03/2020); FOTO not appropriate secondary to memory challenges (04/12/2020)    Time 12    Period Weeks    Status On-going    Target Date 05/26/20                 Plan - 04/19/20 1314    Clinical Impression Statement Continued working on R LE strengthening to promote function and stair negotiation. Pt also able to ambulate safely without AD. Pt tolerated session well without aggravation of symptoms. Pt will benefit from continued skilled physical therapy services to improve ROM strength and  function.    Personal Factors and Comorbidities Comorbidity 3+;Age;Fitness;Past/Current Experience    Comorbidities DM, dizziness, HTN, memory    Examination-Activity Limitations Squat;Stairs;Bed Mobility;Bend;Locomotion Level;Stand;Carry;Transfers    Stability/Clinical Decision Making Stable/Uncomplicated    Clinical Decision Making Low    Rehab Potential Fair    Clinical Impairments Affecting Rehab Potential (-) multiple areas of chronic pain, age, memory. (+) motivated    PT Frequency 2x / week    PT Duration 12 weeks    PT Treatment/Interventions Therapeutic activities;Therapeutic exercise;Balance training;Neuromuscular re-education;Patient/family education;Manual techniques;Dry needling;Vestibular;Aquatic Therapy;Canalith Repostioning;Electrical Stimulation;Iontophoresis 71m/ml Dexamethasone;Gait training;Stair training    PT Next Visit Plan ROM, hip and knee strengthening, gait, manual techniques, modalities PRN    PT Home Exercise Plan Medbridge Access Code [email or web address]   Consulted and Agree with Plan of Care Patient           Patient will benefit from skilled therapeutic intervention in order to improve the following deficits and impairments:  Pain,Improper body mechanics,Postural dysfunction,Difficulty walking,Decreased strength,Decreased scar mobility,Decreased range of motion,Abnormal gait  Visit Diagnosis: Chronic pain of right knee  Difficulty in walking, not elsewhere classified  Muscle weakness (generalized)     Problem List Patient Active Problem List   Diagnosis Date Noted  . Osteoarthritis of  right knee 02/12/2020  . S/P total knee arthroplasty, right 02/11/2020  . Obese 06/20/2016  . Status post right knee replacement 06/19/2016  . S/P left TKA 06/18/2016  . Spinal stenosis of lumbar region 09/14/2015   Joneen Boers PT, DPT   04/19/2020, 6:26 PM  [facility] [address], [place],  [address] Phone: [phone]   Fax:  [phone]  Name: HILLERY ZACHMAN MRN: [id number] Date of Birth: [birth year]

## 2020-04-21 ENCOUNTER — Ambulatory Visit: Payer: Medicare Other

## 2020-04-21 ENCOUNTER — Other Ambulatory Visit: Payer: Self-pay

## 2020-04-21 DIAGNOSIS — M25561 Pain in right knee: Secondary | ICD-10-CM | POA: Diagnosis not present

## 2020-04-21 DIAGNOSIS — R262 Difficulty in walking, not elsewhere classified: Secondary | ICD-10-CM

## 2020-04-21 DIAGNOSIS — G8929 Other chronic pain: Secondary | ICD-10-CM

## 2020-04-21 DIAGNOSIS — M6281 Muscle weakness (generalized): Secondary | ICD-10-CM

## 2020-04-21 NOTE — Therapy (Signed)
[facility] [address], [place], [address] Phone: [phone]   Fax:  [phone]  Physical Therapy Treatment  Patient Details  Name: Destiny Shaw MRN: [id number] Date of Birth: 10-11-1950 Referring Provider (PT): Paralee Cancel, MD   Encounter Date: 04/21/2020   PT End of Session - 04/21/20 1301    Visit Number 13    Number of Visits 25    Date for PT Re-Evaluation 05/26/20    Authorization - Visit Number 3    Authorization - Number of Visits 10    PT Start Time 1301    PT Stop Time 1343    PT Time Calculation (min) 42 min    Activity Tolerance Patient tolerated treatment well    Behavior During Therapy [facility] for tasks assessed/performed           Past Medical History:  Diagnosis Date  . ADD (attention deficit disorder)   . Anemia   . Anginal pain ([place])    pt states has occas chest pain relates to indigestion; pt uses rest to relieve DR [place]-   . Anxiety   . Arthritis   . Concussion   . Depression   . Diabetes mellitus without complication ([place])    type 2   . Dizziness   . Fall   . GERD (gastroesophageal reflux disease)   . Headache   . History of urinary tract infection   . Hyperlipidemia   . Hypertension   . IBS (irritable bowel syndrome)   . Imbalance   . Numbness    right leg   . Numbness in both hands    comes and goes   . Pneumonia    last episode approx 1 year ago   . Sleep apnea    uses cpap not all the time   . Wears glasses     Past Surgical History:  Procedure Laterality Date  . BREAST CYST ASPIRATION Left 1980's   neg  . BREAST CYST EXCISION Right 1980's   neg  . BREAST LUMPECTOMY Right   . BREAST SURGERY    . CARPAL TUNNEL RELEASE    . CESAREAN SECTION     times 2  . COLONOSCOPY WITH PROPOFOL N/A 05/11/2015   Procedure: COLONOSCOPY WITH PROPOFOL;  Surgeon: Manya Silvas, MD;  Location: [facility] ENDOSCOPY;  Service: Endoscopy;   Laterality: N/A;  . DE QUERVAIN'S RELEASE Right   . ESOPHAGOGASTRODUODENOSCOPY (EGD) WITH PROPOFOL N/A 05/11/2015   Procedure: ESOPHAGOGASTRODUODENOSCOPY (EGD) WITH PROPOFOL;  Surgeon: Manya Silvas, MD;  Location: [facility] ENDOSCOPY;  Service: Endoscopy;  Laterality: N/A;  . EYE SURGERY     laser surgery bilat   . HERNIA REPAIR    . KNEE ARTHROSCOPY    . LUMBAR LAMINECTOMY/DECOMPRESSION MICRODISCECTOMY Bilateral 09/14/2015   Procedure: MICRO LUMBAR BILATERAL DECOMPRESSION L4 - L5;  Surgeon: Susa Day, MD;  Location: [facility] ORS;  Service: Orthopedics;  Laterality: Bilateral;  . REDUCTION MAMMAPLASTY Bilateral 1980  . RHINOPLASTY    . TONSILLECTOMY    . TOTAL KNEE ARTHROPLASTY Left 06/18/2016   Procedure: LEFT TOTAL KNEE ARTHROPLASTY;  Surgeon: Paralee Cancel, MD;  Location: [facility] ORS;  Service: Orthopedics;  Laterality: Left;  Adductor Block  . TOTAL KNEE ARTHROPLASTY Right 02/11/2020   Procedure: TOTAL KNEE ARTHROPLASTY;  Surgeon: Paralee Cancel, MD;  Location: [facility]S;  Service: Orthopedics;  Laterality: Right;  . TUBAL LIGATION    . UVULOPALATOPHARYNGOPLASTY  There were no vitals filed for this visit.   Subjective Assessment - 04/21/20 1303    Subjective R knee has been hurting in the front and to the side (anterior lateral). Feels better when sitting. 5/10, not bad when walking    Pertinent History S/P R TKA on 02/11/2020. Pt is currently 3 weeks post op. R knee does not feel great. Has not had PT since leaving the hospital. Has been doing ankle pumps as well as walking with her RW.    Patient Stated Goals Be better able to get into and out of her vehicle (midsized truck), into and out of chairs, be better able to roll in her bed with less L knee pain. Be able to get down on the floor and get back up.     Currently in Pain? Yes    Pain Score 5                                      PT Education - 04/21/20 1350    Education Details ther-ex    Person(s) Educated  Patient    Methods Explanation;Demonstration;Tactile cues;Verbal cues    Comprehension Returned demonstration;Verbalized understanding          Objective   Swelling, scab formation, healing satisfactorily  s/p R TKR12/16/2021   S/P9wks  MedbridgeAccess Code [email or web address]   Manual therapy  Seated STM R anterior kneeto decreasefascial stiffness seated STM R vastus lataralis as well as medial hamstings to decrease muscle tension    Therapeutic Exercise  Seated R knee flexion PROM 10x3 with PT   Then AAROM with PT 10x3  117 degrees R knee flexion AROM   Standing TKE with B UE assist  R 10x5 second holds for 3 sets   Forward step up onto and over with R LE and L UE assist 10x  Lateral step up onto 4 inch step with B UE assist 10x   SLS R LE with B UE assist 10x2 with 5 second holds   Improved exercise technique, movement at target joints, use of target muscles after mod verbal, visual, tactile cues.   Response to treatment Pt tolerated session well without aggravation of symptoms.   Clinical impresssion Improved R knee flexion AROM to 117 degrees today. Continued working on ROM and strengthening to decrease difficulty with transfers, gait, and stair negotiation. Pt tolerated session well without aggravation of symptoms. Pt will benefit from continued skilled physical therapy services to improve ROM, strength and function.       PT Short Term Goals - 04/07/20 1440      PT SHORT TERM GOAL #1   Title Patient will be independent with her initial HEP to improve R knee ROM, strength, and decrease difficulty with gait.    Baseline Pt has started her HEP (03/03/2020)    Time 3    Period Weeks    Status Achieved    Target Date 03/24/20             PT Long Term Goals - 04/12/20 1312      PT LONG TERM GOAL #1   Title Patient will improve R knee flexion AROM to 120 degrees to promote ability to ambulate and negotiate stairs with less  difficulty.    Baseline 76 degrees setaed R knee flexion AROM (03/03/2020); 115 degrees flexion 04/07/20; 115 degrees (04/12/2020)    Time 12    Period Weeks  Status Partially Met    Target Date 05/26/20      PT LONG TERM GOAL #2   Title Patient will improve R knee extension AROM to -5 degrees or more to promote ability to ambulate as well as perform standing tasks with less difficulty.    Baseline -28 degrees R knee extension AROM (03/03/2020); -15 degrees on 04/07/20; -11 degrees (04/12/2020)    Time 12    Period Weeks    Status On-going    Target Date 05/26/20      PT LONG TERM GOAL #3   Title Pt will improve R knee flexion and extension strength to 5/5 to promote ability to perform standing tasks with less difficulty.    Baseline 4-/5 R knee flexion and 4/5 extension (03/03/2020); 5/5 R knee flexion, 5/5 knee extension (04/12/2020)    Time 12    Period Weeks    Status Achieved    Target Date 05/26/20      PT LONG TERM GOAL #4   Title Pt will be able to ambulate at least 300 ft independently and without LOB to promote mobility.    Baseline Pt currently ambulating with rw (03/03/2020); Pt currently ambulating with SPC (04/12/2020)    Time 12    Period Weeks    Status Partially Met    Target Date 05/26/20      PT LONG TERM GOAL #5   Title Pt will improve her R knee FOTO score by at least 10 points as a demonstration of improved function.    Baseline Pt was unable to fill out FOTO at eval (03/03/2020); FOTO not appropriate secondary to memory challenges (04/12/2020)    Time 12    Period Weeks    Status On-going    Target Date 05/26/20                 Plan - 04/21/20 1349    Clinical Impression Statement Improved R knee flexion AROM to 117 degrees today. Continued working on ROM and strengthening to decrease difficulty with transfers, gait, and stair negotiation. Pt tolerated session well without aggravation of symptoms. Pt will benefit from continued skilled physical therapy  services to improve ROM, strength and function.    Personal Factors and Comorbidities Comorbidity 3+;Age;Fitness;Past/Current Experience    Comorbidities DM, dizziness, HTN, memory    Examination-Activity Limitations Squat;Stairs;Bed Mobility;Bend;Locomotion Level;Stand;Carry;Transfers    Stability/Clinical Decision Making Stable/Uncomplicated    Clinical Decision Making Low    Rehab Potential Fair    Clinical Impairments Affecting Rehab Potential (-) multiple areas of chronic pain, age, memory. (+) motivated    PT Frequency 2x / week    PT Duration 12 weeks    PT Treatment/Interventions Therapeutic activities;Therapeutic exercise;Balance training;Neuromuscular re-education;Patient/family education;Manual techniques;Dry needling;Vestibular;Aquatic Therapy;Canalith Repostioning;Electrical Stimulation;Iontophoresis 75m/ml Dexamethasone;Gait training;Stair training    PT Next Visit Plan ROM, hip and knee strengthening, gait, manual techniques, modalities PRN    PT Home Exercise Plan Medbridge Access Code [email or web address]   Consulted and Agree with Plan of Care Patient           Patient will benefit from skilled therapeutic intervention in order to improve the following deficits and impairments:  Pain,Improper body mechanics,Postural dysfunction,Difficulty walking,Decreased strength,Decreased scar mobility,Decreased range of motion,Abnormal gait  Visit Diagnosis: Chronic pain of right knee  Difficulty in walking, not elsewhere classified  Muscle weakness (generalized)     Problem List Patient Active Problem List   Diagnosis Date Noted  . Osteoarthritis of right knee 02/12/2020  . S/P  total knee arthroplasty, right 02/11/2020  . Obese 06/20/2016  . Status post right knee replacement 06/19/2016  . S/P left TKA 06/18/2016  . Spinal stenosis of lumbar region 09/14/2015    Joneen Boers PT, DPT   04/21/2020, 6:25 PM  [facility] [address] [place], [address] Phone: [phone]   Fax:  [phone]  Name: Destiny Shaw MRN: [id number] Date of Birth: [birth year]

## 2020-04-26 ENCOUNTER — Ambulatory Visit: Payer: Medicare Other | Attending: Orthopedic Surgery

## 2020-04-26 ENCOUNTER — Other Ambulatory Visit: Payer: Self-pay

## 2020-04-26 DIAGNOSIS — R262 Difficulty in walking, not elsewhere classified: Secondary | ICD-10-CM | POA: Diagnosis present

## 2020-04-26 DIAGNOSIS — G8929 Other chronic pain: Secondary | ICD-10-CM | POA: Insufficient documentation

## 2020-04-26 DIAGNOSIS — M6281 Muscle weakness (generalized): Secondary | ICD-10-CM | POA: Diagnosis present

## 2020-04-26 DIAGNOSIS — M25561 Pain in right knee: Secondary | ICD-10-CM | POA: Insufficient documentation

## 2020-04-26 NOTE — Therapy (Signed)
[place] [address] [place], [address] Phone: [phone]   Fax:  [phone]  Physical Therapy Treatment  Patient Details  Name: Destiny Shaw MRN: [id number] Date of Birth: 1950/04/12 Referring Provider (PT): Paralee Cancel, MD   Encounter Date: 04/26/2020   PT End of Session - 04/26/20 1306    Visit Number 14    Number of Visits 25    Date for PT Re-Evaluation 05/26/20    Authorization - Visit Number 4    Authorization - Number of Visits 10    PT Start Time 8563    PT Stop Time 1345    PT Time Calculation (min) 39 min    Activity Tolerance Patient tolerated treatment well    Behavior During Therapy [facility] for tasks assessed/performed           Past Medical History:  Diagnosis Date  . ADD (attention deficit disorder)   . Anemia   . Anginal pain ([place])    pt states has occas chest pain relates to indigestion; pt uses rest to relieve DR [place]-   . Anxiety   . Arthritis   . Concussion   . Depression   . Diabetes mellitus without complication ([place])    type 2   . Dizziness   . Fall   . GERD (gastroesophageal reflux disease)   . Headache   . History of urinary tract infection   . Hyperlipidemia   . Hypertension   . IBS (irritable bowel syndrome)   . Imbalance   . Numbness    right leg   . Numbness in both hands    comes and goes   . Pneumonia    last episode approx 1 year ago   . Sleep apnea    uses cpap not all the time   . Wears glasses     Past Surgical History:  Procedure Laterality Date  . BREAST CYST ASPIRATION Left 1980's   neg  . BREAST CYST EXCISION Right 1980's   neg  . BREAST LUMPECTOMY Right   . BREAST SURGERY    . CARPAL TUNNEL RELEASE    . CESAREAN SECTION     times 2  . COLONOSCOPY WITH PROPOFOL N/A 05/11/2015   Procedure: COLONOSCOPY WITH PROPOFOL;  Surgeon: Manya Silvas, MD;  Location: [facility] ENDOSCOPY;  Service: Endoscopy;  Laterality:  N/A;  . DE QUERVAIN'S RELEASE Right   . ESOPHAGOGASTRODUODENOSCOPY (EGD) WITH PROPOFOL N/A 05/11/2015   Procedure: ESOPHAGOGASTRODUODENOSCOPY (EGD) WITH PROPOFOL;  Surgeon: Manya Silvas, MD;  Location: [facility] ENDOSCOPY;  Service: Endoscopy;  Laterality: N/A;  . EYE SURGERY     laser surgery bilat   . HERNIA REPAIR    . KNEE ARTHROSCOPY    . LUMBAR LAMINECTOMY/DECOMPRESSION MICRODISCECTOMY Bilateral 09/14/2015   Procedure: MICRO LUMBAR BILATERAL DECOMPRESSION L4 - L5;  Surgeon: Susa Day, MD;  Location: [facility] ORS;  Service: Orthopedics;  Laterality: Bilateral;  . REDUCTION MAMMAPLASTY Bilateral 1980  . RHINOPLASTY    . TONSILLECTOMY    . TOTAL KNEE ARTHROPLASTY Left 06/18/2016   Procedure: LEFT TOTAL KNEE ARTHROPLASTY;  Surgeon: Paralee Cancel, MD;  Location: [facility] ORS;  Service: Orthopedics;  Laterality: Left;  Adductor Block  . TOTAL KNEE ARTHROPLASTY Right 02/11/2020   Procedure: TOTAL KNEE ARTHROPLASTY;  Surgeon: Paralee Cancel, MD;  Location: [facility] ORS;  Service: Orthopedics;  Laterality: Right;  . TUBAL LIGATION    . UVULOPALATOPHARYNGOPLASTY  There were no vitals filed for this visit.   Subjective Assessment - 04/26/20 1307    Subjective R knee was horible this morning but worked with it. It's a little better now. Mornings are bad. Takes about half a day to get it moving again. Walking helps. R lateral knee stays sore.    Pertinent History S/P R TKA on 02/11/2020. Pt is currently 3 weeks post op. R knee does not feel great. Has not had PT since leaving the hospital. Has been doing ankle pumps as well as walking with her RW.    Patient Stated Goals Be better able to get into and out of her vehicle (midsized truck), into and out of chairs, be better able to roll in her bed with less L knee pain. Be able to get down on the floor and get back up.     Currently in Pain? Yes    Pain Score 6                                      PT Education - 04/26/20 1333     Education Details ther-ex    Person(s) Educated Patient    Methods Explanation;Demonstration;Tactile cues;Verbal cues    Comprehension Returned demonstration;Verbalized understanding           Objective   Swelling, scab formation, healing satisfactorily  s/p R TKR12/16/2021   S/P9-10wks  MedbridgeAccess Code [email or web address]   Manual therapy  Seated STM R anterior lateral knee Seated medial glide R patella grade 3 to promote mobility seated STM R vastus lataralis  Supine STM hamstrings to decrease muscle tension   Therapeutic Exercise Seated R knee flexion red band 10x2  Sit <> stand from chair with arms without UE assist, arms crossed 10x. Able to perform.   Supine quad set with over pressure 10x5 seconds for 3 sets to promote leg extension AROM  forward step up onto regular step with B UE assist   R 10x  Ascending and descending 4 regular steps with B UE asisst, reciprocal pattern 3x. Cues for proper technique    Improved exercise technique, movement at target joints, use of target muscles after mod verbal, visual, tactile cues.   Response to treatment Pt tolerated session well without aggravation of symptoms.   Clinical impresssion Able to ascend to descend regular steps with B UE with proper technique and femoral control. Continued working on improving knee extension to promote stance phase during gait, as well as soft tissue mobility to improve fascial stiffness and decrease muscle tightness. Pt tolerated session well without aggravation of symptoms. Pt will benefit from continued skilled physical therapy services to decrease pain, improve strength and function.       PT Short Term Goals - 04/07/20 1440      PT SHORT TERM GOAL #1   Title Patient will be independent with her initial HEP to improve R knee ROM, strength, and decrease difficulty with gait.    Baseline Pt has started her HEP (03/03/2020)    Time 3    Period Weeks    Status Achieved     Target Date 03/24/20             PT Long Term Goals - 04/12/20 1312      PT LONG TERM GOAL #1   Title Patient will improve R knee flexion AROM to 120 degrees to promote ability to ambulate and negotiate  stairs with less difficulty.    Baseline 76 degrees setaed R knee flexion AROM (03/03/2020); 115 degrees flexion 04/07/20; 115 degrees (04/12/2020)    Time 12    Period Weeks    Status Partially Met    Target Date 05/26/20      PT LONG TERM GOAL #2   Title Patient will improve R knee extension AROM to -5 degrees or more to promote ability to ambulate as well as perform standing tasks with less difficulty.    Baseline -28 degrees R knee extension AROM (03/03/2020); -15 degrees on 04/07/20; -11 degrees (04/12/2020)    Time 12    Period Weeks    Status On-going    Target Date 05/26/20      PT LONG TERM GOAL #3   Title Pt will improve R knee flexion and extension strength to 5/5 to promote ability to perform standing tasks with less difficulty.    Baseline 4-/5 R knee flexion and 4/5 extension (03/03/2020); 5/5 R knee flexion, 5/5 knee extension (04/12/2020)    Time 12    Period Weeks    Status Achieved    Target Date 05/26/20      PT LONG TERM GOAL #4   Title Pt will be able to ambulate at least 300 ft independently and without LOB to promote mobility.    Baseline Pt currently ambulating with rw (03/03/2020); Pt currently ambulating with SPC (04/12/2020)    Time 12    Period Weeks    Status Partially Met    Target Date 05/26/20      PT LONG TERM GOAL #5   Title Pt will improve her R knee FOTO score by at least 10 points as a demonstration of improved function.    Baseline Pt was unable to fill out FOTO at eval (03/03/2020); FOTO not appropriate secondary to memory challenges (04/12/2020)    Time 12    Period Weeks    Status On-going    Target Date 05/26/20                 Plan - 04/26/20 1334    Clinical Impression Statement Able to ascend to descend regular steps with B UE  with proper technique and femoral control. Continued working on improving knee extension to promote stance phase during gait, as well as soft tissue mobility to improve fascial stiffness and decrease muscle tightness. Pt tolerated session well without aggravation of symptoms. Pt will benefit from continued skilled physical therapy services to decrease pain, improve strength and function.    Personal Factors and Comorbidities Comorbidity 3+;Age;Fitness;Past/Current Experience    Comorbidities DM, dizziness, HTN, memory    Examination-Activity Limitations Squat;Stairs;Bed Mobility;Bend;Locomotion Level;Stand;Carry;Transfers    Stability/Clinical Decision Making Stable/Uncomplicated    Clinical Decision Making Low    Rehab Potential Fair    Clinical Impairments Affecting Rehab Potential (-) multiple areas of chronic pain, age, memory. (+) motivated    PT Frequency 2x / week    PT Duration 12 weeks    PT Treatment/Interventions Therapeutic activities;Therapeutic exercise;Balance training;Neuromuscular re-education;Patient/family education;Manual techniques;Dry needling;Vestibular;Aquatic Therapy;Canalith Repostioning;Electrical Stimulation;Iontophoresis 49m/ml Dexamethasone;Gait training;Stair training    PT Next Visit Plan ROM, hip and knee strengthening, gait, manual techniques, modalities PRN    PT Home Exercise Plan Medbridge Access Code [email or web address]   Consulted and Agree with Plan of Care Patient           Patient will benefit from skilled therapeutic intervention in order to improve the following deficits and impairments:  Pain,Improper  body mechanics,Postural dysfunction,Difficulty walking,Decreased strength,Decreased scar mobility,Decreased range of motion,Abnormal gait  Visit Diagnosis: Chronic pain of right knee  Difficulty in walking, not elsewhere classified  Muscle weakness (generalized)     Problem List Patient Active Problem List   Diagnosis Date Noted  . Osteoarthritis of  right knee 02/12/2020  . S/P total knee arthroplasty, right 02/11/2020  . Obese 06/20/2016  . Status post right knee replacement 06/19/2016  . S/P left TKA 06/18/2016  . Spinal stenosis of lumbar region 09/14/2015   Joneen Boers PT, DPT   04/26/2020, 1:52 PM  [facility] [address] [place], [address] Phone: [phone]   Fax:  [phone]  Name: Destiny Shaw MRN: [id number] Date of Birth: December 19, 1950

## 2020-04-28 ENCOUNTER — Ambulatory Visit: Payer: Medicare Other

## 2020-04-28 ENCOUNTER — Other Ambulatory Visit: Payer: Self-pay

## 2020-04-28 DIAGNOSIS — R262 Difficulty in walking, not elsewhere classified: Secondary | ICD-10-CM

## 2020-04-28 DIAGNOSIS — M6281 Muscle weakness (generalized): Secondary | ICD-10-CM

## 2020-04-28 DIAGNOSIS — M25561 Pain in right knee: Secondary | ICD-10-CM | POA: Diagnosis not present

## 2020-04-28 DIAGNOSIS — G8929 Other chronic pain: Secondary | ICD-10-CM

## 2020-04-28 NOTE — Therapy (Signed)
[place] [facility] [address], [place], [address] Phone: [phone]   Fax:  [phone]  Physical Therapy Treatment  Patient Details  Name: Destiny Shaw MRN: [id number] Date of Birth: [birth year] Referring Provider (PT): Paralee Cancel, MD   Encounter Date: 04/28/2020   PT End of Session - 04/28/20 1300    Visit Number 15    Number of Visits 25    Date for PT Re-Evaluation 05/26/20    Authorization - Visit Number 5    Authorization - Number of Visits 10    PT Start Time 1300    PT Stop Time 1339    PT Time Calculation (min) 39 min    Activity Tolerance Patient tolerated treatment well    Behavior During Therapy [facility] for tasks assessed/performed           Past Medical History:  Diagnosis Date  . ADD (attention deficit disorder)   . Anemia   . Anginal pain ([place])    pt states has occas chest pain relates to indigestion; pt uses rest to relieve DR [place]-   . Anxiety   . Arthritis   . Concussion   . Depression   . Diabetes mellitus without complication ([place])    type 2   . Dizziness   . Fall   . GERD (gastroesophageal reflux disease)   . Headache   . History of urinary tract infection   . Hyperlipidemia   . Hypertension   . IBS (irritable bowel syndrome)   . Imbalance   . Numbness    right leg   . Numbness in both hands    comes and goes   . Pneumonia    last episode approx 1 year ago   . Sleep apnea    uses cpap not all the time   . Wears glasses     Past Surgical History:  Procedure Laterality Date  . BREAST CYST ASPIRATION Left 1980's   neg  . BREAST CYST EXCISION Right 1980's   neg  . BREAST LUMPECTOMY Right   . BREAST SURGERY    . CARPAL TUNNEL RELEASE    . CESAREAN SECTION     times 2  . COLONOSCOPY WITH PROPOFOL N/A 05/11/2015   Procedure: COLONOSCOPY WITH PROPOFOL;  Surgeon: Manya Silvas, MD;  Location: [facility] ENDOSCOPY;  Service: Endoscopy;  Laterality:  N/A;  . DE QUERVAIN'S RELEASE Right   . ESOPHAGOGASTRODUODENOSCOPY (EGD) WITH PROPOFOL N/A 05/11/2015   Procedure: ESOPHAGOGASTRODUODENOSCOPY (EGD) WITH PROPOFOL;  Surgeon: Manya Silvas, MD;  Location: [facility] END[facility]COPY;  Service: Endoscopy;  Laterality: N/A;  . EYE SURGERY     laser surgery bilat   . HERNIA REPAIR    . KNEE ARTHROSCOPY    . LUMBAR LAMINECTOMY/DECOMPRESSION MICRODISCECTOMY Bilateral 09/14/2015   Procedure: MICRO LUMBAR BILATERAL DECOMPRESSION L4 - L5;  Surgeon: Susa Day, MD;  Location: [facility] ORS;  Service: Orthopedics;  Laterality: Bilateral;  . REDUCTION MAMMAPLASTY Bilateral 1980  . RHINOPLASTY    . TONSILLECTOMY    . TOTAL KNEE ARTHROPLASTY Left 06/18/2016   Procedure: LEFT TOTAL KNEE ARTHROPLASTY;  Surgeon: Paralee Cancel, MD;  Location: [facility] ORS;  Service: Orthopedics;  Laterality: Left;  Adductor Block  . TOTAL KNEE ARTHROPLASTY Right 02/11/2020   Procedure: TOTAL KNEE ARTHROPLASTY;  Surgeon: Paralee Cancel, MD;  Location: [facility] ORS;  Service: Orthopedics;  Laterality: Right;  . TUBAL LIGATION    . UVULOPALATOPHARYNGOPLASTY  There were no vitals filed for this visit.   Subjective Assessment - 04/28/20 1301    Subjective 5-6/10 R knee currently, not bad. Was bad last night. Had to get help just to get to bed.    Pertinent History S/P R TKA on 02/11/2020. Pt is currently 3 weeks post op. R knee does not feel great. Has not had PT since leaving the hospital. Has been doing ankle pumps as well as walking with her RW.    Patient Stated Goals Be better able to get into and out of her vehicle (midsized truck), into and out of chairs, be better able to roll in her bed with less L knee pain. Be able to get down on the floor and get back up.     Currently in Pain? Yes    Pain Score 6                                      PT Education - 04/28/20 1317    Education Details ther-ex    Person(s) Educated Patient    Methods  Explanation;Demonstration;Tactile cues;Verbal cues    Comprehension Returned demonstration;Verbalized understanding          Objective   Swelling, scab formation, healing satisfactorily  s/p R TKR12/16/2021   S/P9-10wks  MedbridgeAccess Code [email or web address]     Therapeutic Exercise Sit <> stand from chair with arms without UE assist, arms crossed 10x. Able to perform  Seated R knee flexion red band 10x2 . forward step up onto regular step with B UE assist              R 10x  Then with L UE assist 10x R LE  Standing bilateral gastroc stretch as stair step with B UE assist 30 seconds x 3  Ascending and descending 4 regular steps with B UE asisst, reciprocal pattern 4x. Cues for proper technique. Improved femoral control.   SLS R LE with L UE assist 10x5 seconds for 2 sets  Supine quad set with over pressure 10x5 seconds for 2 sets to promote leg extension AROM   Improved exercise technique, movement at target joints, use of target muscles after mod verbal, visual, tactile cues.  Manual therapy  Seated STM R anterior lateral knee to decrease fascial restrictions    Response to treatment Pt states R knee feels and moves better after session.   Clinical impresssion Decreased R knee pain with exercise and movement, suggesting stiffness. Pt was recommended to move her knee when it bothers her at home. Pt verbalized understanding. Continued working on R LE strengthening to improve ability to ambulate and negotiate stairs with less difficulty. Pt will benefit from continued skilled physical therapy services to improve ROM, strength and function.       PT Short Term Goals - 04/07/20 1440      PT SHORT TERM GOAL #1   Title Patient will be independent with her initial HEP to improve R knee ROM, strength, and decrease difficulty with gait.    Baseline Pt has started her HEP (03/03/2020)    Time 3    Period Weeks    Status Achieved    Target Date 03/24/20              PT Long Term Goals - 04/12/20 1312      PT LONG TERM GOAL #1   Title Patient will improve R knee flexion  AROM to 120 degrees to promote ability to ambulate and negotiate stairs with less difficulty.    Baseline 76 degrees setaed R knee flexion AROM (03/03/2020); 115 degrees flexion 04/07/20; 115 degrees (04/12/2020)    Time 12    Period Weeks    Status Partially Met    Target Date 05/26/20      PT LONG TERM GOAL #2   Title Patient will improve R knee extension AROM to -5 degrees or more to promote ability to ambulate as well as perform standing tasks with less difficulty.    Baseline -28 degrees R knee extension AROM (03/03/2020); -15 degrees on 04/07/20; -11 degrees (04/12/2020)    Time 12    Period Weeks    Status On-going    Target Date 05/26/20      PT LONG TERM GOAL #3   Title Pt will improve R knee flexion and extension strength to 5/5 to promote ability to perform standing tasks with less difficulty.    Baseline 4-/5 R knee flexion and 4/5 extension (03/03/2020); 5/5 R knee flexion, 5/5 knee extension (04/12/2020)    Time 12    Period Weeks    Status Achieved    Target Date 05/26/20      PT LONG TERM GOAL #4   Title Pt will be able to ambulate at least 300 ft independently and without LOB to promote mobility.    Baseline Pt currently ambulating with rw (03/03/2020); Pt currently ambulating with SPC (04/12/2020)    Time 12    Period Weeks    Status Partially Met    Target Date 05/26/20      PT LONG TERM GOAL #5   Title Pt will improve her R knee FOTO score by at least 10 points as a demonstration of improved function.    Baseline Pt was unable to fill out FOTO at eval (03/03/2020); FOTO not appropriate secondary to memory challenges (04/12/2020)    Time 12    Period Weeks    Status On-going    Target Date 05/26/20                 Plan - 04/28/20 1259    Clinical Impression Statement Decreased R knee pain with exercise and movement, suggesting stiffness. Pt  was recommended to move her knee when it bothers her at home. Pt verbalized understanding. Continued working on R LE strengthening to improve ability to ambulate and negotiate stairs with less difficulty. Pt will benefit from continued skilled physical therapy services to improve ROM, strength and function.    Personal Factors and Comorbidities Comorbidity 3+;Age;Fitness;Past/Current Experience    Comorbidities DM, dizziness, HTN, memory    Examination-Activity Limitations Squat;Stairs;Bed Mobility;Bend;Locomotion Level;Stand;Carry;Transfers    Stability/Clinical Decision Making Stable/Uncomplicated    Clinical Decision Making Low    Rehab Potential Fair    Clinical Impairments Affecting Rehab Potential (-) multiple areas of chronic pain, age, memory. (+) motivated    PT Frequency 2x / week    PT Duration 12 weeks    PT Treatment/Interventions Therapeutic activities;Therapeutic exercise;Balance training;Neuromuscular re-education;Patient/family education;Manual techniques;Dry needling;Vestibular;Aquatic Therapy;Canalith Repostioning;Electrical Stimulation;Iontophoresis 19m/ml Dexamethasone;Gait training;Stair training    PT Next Visit Plan ROM, hip and knee strengthening, gait, manual techniques, modalities PRN    PT Home Exercise Plan Medbridge Access Code [email or web address]   Consulted and Agree with Plan of Care Patient           Patient will benefit from skilled therapeutic intervention in order to improve the following deficits and  impairments:  Pain,Improper body mechanics,Postural dysfunction,Difficulty walking,Decreased strength,Decreased scar mobility,Decreased range of motion,Abnormal gait  Visit Diagnosis: Chronic pain of right knee  Difficulty in walking, not elsewhere classified  Muscle weakness (generalized)     Problem List Patient Active Problem List   Diagnosis Date Noted  . Osteoarthritis of right knee 02/12/2020  . S/P total knee arthroplasty, right 02/11/2020  . Obese  06/20/2016  . Status post right knee replacement 06/19/2016  . S/P left TKA 06/18/2016  . Spinal stenosis of lumbar region 09/14/2015    Joneen Boers PT, DPT   04/28/2020, 1:46 PM  [place] [facility] [address] [place], [address] Phone: [phone]   Fax:  [phone]  Name: HELINA HULLUM MRN: [id number] Date of Birth: 03/05/1950

## 2020-05-03 ENCOUNTER — Ambulatory Visit: Payer: Medicare Other

## 2020-05-03 ENCOUNTER — Other Ambulatory Visit: Payer: Self-pay

## 2020-05-03 DIAGNOSIS — G8929 Other chronic pain: Secondary | ICD-10-CM

## 2020-05-03 DIAGNOSIS — M6281 Muscle weakness (generalized): Secondary | ICD-10-CM

## 2020-05-03 DIAGNOSIS — R262 Difficulty in walking, not elsewhere classified: Secondary | ICD-10-CM

## 2020-05-03 DIAGNOSIS — M25561 Pain in right knee: Secondary | ICD-10-CM | POA: Diagnosis not present

## 2020-05-03 NOTE — Therapy (Signed)
[place] [facility] [address], [place], [address] Phone: [phone]   Fax:  [phone]  Physical Therapy Treatment  Patient Details  Name: Destiny Shaw MRN: [id number] Date of Birth: Jan 20, 1951 Referring Provider (PT): Paralee Cancel, MD   Encounter Date: 05/03/2020   PT End of Session - 05/03/20 1332    Visit Number 16    Number of Visits 25    Date for PT Re-Evaluation 05/26/20    Authorization Time Period 03/03/20-05/26/20    PT Start Time 2585    PT Stop Time 1408    PT Time Calculation (min) 40 min    Activity Tolerance Patient tolerated treatment well;No increased pain;Patient limited by pain    Behavior During Therapy [facility] for tasks assessed/performed           Past Medical History:  Diagnosis Date  . ADD (attention deficit disorder)   . Anemia   . Anginal pain ([place])    pt states has occas chest pain relates to indigestion; pt uses rest to relieve DR [place]-   . Anxiety   . Arthritis   . Concussion   . Depression   . Diabetes mellitus without complication ([place])    type 2   . Dizziness   . Fall   . GERD (gastroesophageal reflux disease)   . Headache   . History of urinary tract infection   . Hyperlipidemia   . Hypertension   . IBS (irritable bowel syndrome)   . Imbalance   . Numbness    right leg   . Numbness in both hands    comes and goes   . Pneumonia    last episode approx 1 year ago   . Sleep apnea    uses cpap not all the time   . Wears glasses     Past Surgical History:  Procedure Laterality Date  . BREAST CYST ASPIRATION Left 1980's   neg  . BREAST CYST EXCISION Right 1980's   neg  . BREAST LUMPECTOMY Right   . BREAST SURGERY    . CARPAL TUNNEL RELEASE    . CESAREAN SECTION     times 2  . COLONOSCOPY WITH PROPOFOL N/A 05/11/2015   Procedure: COLONOSCOPY WITH PROPOFOL;  Surgeon: Manya Silvas, MD;  Location: [facility] ENDOSCOPY;  Service: Endoscopy;   Laterality: N/A;  . DE QUERVAIN'S RELEASE Right   . ESOPHAGOGASTRODUODENOSCOPY (EGD) WITH PROPOFOL N/A 05/11/2015   Procedure: ESOPHAGOGASTRODUODENOSCOPY (EGD) WITH PROPOFOL;  Surgeon: Manya Silvas, MD;  Location: [facility] ENDOSCOPY;  Service: Endoscopy;  Laterality: N/A;  . EYE SURGERY     laser surgery bilat   . HERNIA REPAIR    . KNEE ARTHROSCOPY    . LUMBAR LAMINECTOMY/DECOMPRESSION MICRODISCECTOMY Bilateral 09/14/2015   Procedure: MICRO LUMBAR BILATERAL DECOMPRESSION L4 - L5;  Surgeon: Susa Day, MD;  Location: [facility] ORS;  Service: Orthopedics;  Laterality: Bilateral;  . REDUCTION MAMMAPLASTY Bilateral 1980  . RHINOPLASTY    . TONSILLECTOMY    . TOTAL KNEE ARTHROPLASTY Left 06/18/2016   Procedure: LEFT TOTAL KNEE ARTHROPLASTY;  Surgeon: Paralee Cancel, MD;  Location: [facility] ORS;  Service: Orthopedics;  Laterality: Left;  Adductor Block  . TOTAL KNEE ARTHROPLASTY Right 02/11/2020   Procedure: TOTAL KNEE ARTHROPLASTY;  Surgeon: Paralee Cancel, MD;  Location: [facility] ORS;  Service: Orthopedics;  Laterality: Right;  . TUBAL LIGATION    . UVULOPALATOPHARYNGOPLASTY      There were no vitals filed  for this visit.   Subjective Assessment - 05/03/20 1330    Subjective Pt doing ok, still working on activity at home, but feels best when resting her leg. Pt sees Dr. Alvan Dame tomorrow.    Pertinent History S/P R TKA on 02/11/2020. Pt is currently 3 weeks post op. R knee does not feel great. Has not had PT since leaving the hospital. Has been doing ankle pumps as well as walking with her RW.    Patient Stated Goals Be better able to get into and out of her vehicle (midsized truck), into and out of chairs, be better able to roll in her bed with less L knee pain. Be able to get down on the floor and get back up.     Currently in Pain? Yes    Pain Score 6     Pain Location Knee           INTERVENTION THIS DATE: -AA/ROM using nu-step, 3 minutes, level zero, seat 8, arms 9  -heel hang bilat calf stretch  3x30sec -Rt leg lunge (on 2nd step) 4x20 sec (peak flexion at 120 degrees)  -STS from chair hands free 2x10  -seated LAQ 2x12 @ 3lb AW (c leg dangle)   -dangle c ice on knee -standing knee flexion/HS curl 3lb AW (BUE supported) 2x15  -standing heel raises 2x20 bilat (BUE supported)     PT Short Term Goals - 04/07/20 1440      PT SHORT TERM GOAL #1   Title Patient will be independent with her initial HEP to improve R knee ROM, strength, and decrease difficulty with gait.    Baseline Pt has started her HEP (03/03/2020)    Time 3    Period Weeks    Status Achieved    Target Date 03/24/20             PT Long Term Goals - 04/12/20 1312      PT LONG TERM GOAL #1   Title Patient will improve R knee flexion AROM to 120 degrees to promote ability to ambulate and negotiate stairs with less difficulty.    Baseline 76 degrees setaed R knee flexion AROM (03/03/2020); 115 degrees flexion 04/07/20; 115 degrees (04/12/2020)    Time 12    Period Weeks    Status Partially Met    Target Date 05/26/20      PT LONG TERM GOAL #2   Title Patient will improve R knee extension AROM to -5 degrees or more to promote ability to ambulate as well as perform standing tasks with less difficulty.    Baseline -28 degrees R knee extension AROM (03/03/2020); -15 degrees on 04/07/20; -11 degrees (04/12/2020)    Time 12    Period Weeks    Status On-going    Target Date 05/26/20      PT LONG TERM GOAL #3   Title Pt will improve R knee flexion and extension strength to 5/5 to promote ability to perform standing tasks with less difficulty.    Baseline 4-/5 R knee flexion and 4/5 extension (03/03/2020); 5/5 R knee flexion, 5/5 knee extension (04/12/2020)    Time 12    Period Weeks    Status Achieved    Target Date 05/26/20      PT LONG TERM GOAL #4   Title Pt will be able to ambulate at least 300 ft independently and without LOB to promote mobility.    Baseline Pt currently ambulating with rw (03/03/2020); Pt currently  ambulating with  SPC (04/12/2020)    Time 12    Period Weeks    Status Partially Met    Target Date 05/26/20      PT LONG TERM GOAL #5   Title Pt will improve her R knee FOTO score by at least 10 points as a demonstration of improved function.    Baseline Pt was unable to fill out FOTO at eval (03/03/2020); FOTO not appropriate secondary to memory challenges (04/12/2020)    Time 12    Period Weeks    Status On-going    Target Date 05/26/20                 Plan - 05/03/20 1333    Clinical Impression Statement Continued with current plan of care as laid out in evaluation and recent prior sessions. Pt remains motivated to advance progress toward goals. ROM of operataive knee at 14-120 degrees this date, still struggles with comfort in force production at end range flexion. Goals moving forward will focus on conitnued development of strength in RLE to equalize function in symmetrical gait and transfers. Pt still managing pain at night and with more active days. Rest breaks provided as needed, pt quick to ask when needed. Author maintains all interventions within appropriate level of intensity as not to purposefully exacerbate pain. Pt does require varying levels of assistance and cuing for completion of exercises for correct form and sometimes due to pain/weakness. Pt continues to demonstrate progress toward goals AEB progression of some interventions this date either in volume or intensity. No updates to HEP this date.    Personal Factors and Comorbidities Comorbidity 3+;Age;Fitness;Past/Current Experience    Comorbidities DM, dizziness, HTN, memory    Examination-Activity Limitations Squat;Stairs;Bed Mobility;Bend;Locomotion Level;Stand;Carry;Transfers    Stability/Clinical Decision Making Stable/Uncomplicated    Clinical Decision Making Low    Rehab Potential Fair    Clinical Impairments Affecting Rehab Potential (-) multiple areas of chronic pain, age, memory. (+) motivated    PT Frequency  1x / week    PT Duration 12 weeks    PT Treatment/Interventions Therapeutic activities;Therapeutic exercise;Balance training;Neuromuscular re-education;Patient/family education;Manual techniques;Dry needling;Vestibular;Aquatic Therapy;Canalith Repostioning;Electrical Stimulation;Iontophoresis 49m/ml Dexamethasone;Gait training;Stair training    PT Next Visit Plan ROM, hip and knee strengthening, gait, manual techniques, modalities PRN    PT Home Exercise Plan Medbridge Access Code [email or web address]   Consulted and Agree with Plan of Care Patient           Patient will benefit from skilled therapeutic intervention in order to improve the following deficits and impairments:  Pain,Improper body mechanics,Postural dysfunction,Difficulty walking,Decreased strength,Decreased scar mobility,Decreased range of motion,Abnormal gait  Visit Diagnosis: Chronic pain of right knee  Difficulty in walking, not elsewhere classified  Muscle weakness (generalized)     Problem List Patient Active Problem List   Diagnosis Date Noted  . Osteoarthritis of right knee 02/12/2020  . S/P total knee arthroplasty, right 02/11/2020  . Obese 06/20/2016  . Status post right knee replacement 06/19/2016  . S/P left TKA 06/18/2016  . Spinal stenosis of lumbar region 09/14/2015   2:09 PM, 05/03/20 AEtta Grandchild PT, DPT Physical Therapist - [phone](Office)  BEtta Grandchild3/09/2020, 2:09 PM  [facility] [address] [place] [address]Phone: [phone]  Fax:  [phone] Name: Destiny TOWNERMRN: [id number] of Birth: 403-20-1952

## 2020-05-05 ENCOUNTER — Ambulatory Visit: Payer: Medicare Other

## 2020-05-05 ENCOUNTER — Other Ambulatory Visit: Payer: Self-pay

## 2020-05-05 DIAGNOSIS — M25561 Pain in right knee: Secondary | ICD-10-CM | POA: Diagnosis not present

## 2020-05-05 DIAGNOSIS — R262 Difficulty in walking, not elsewhere classified: Secondary | ICD-10-CM

## 2020-05-05 DIAGNOSIS — M6281 Muscle weakness (generalized): Secondary | ICD-10-CM

## 2020-05-05 DIAGNOSIS — G8929 Other chronic pain: Secondary | ICD-10-CM

## 2020-05-05 NOTE — Therapy (Signed)
[place] [facility] [address], [place], [address] Phone: [phone]   Fax:  [phone]  Physical Therapy Treatment  Patient Details  Name: Destiny Shaw MRN: [id number] Date of Birth: 09/15/1950 Referring Provider (PT): Paralee Cancel, MD   Encounter Date: 05/05/2020   PT End of Session - 05/05/20 1331    Visit Number 17    Number of Visits 25    Date for PT Re-Evaluation 05/26/20    Authorization Type 7    Authorization Time Period 10    PT Start Time 1332    PT Stop Time 1412    PT Time Calculation (min) 40 min    Activity Tolerance Patient tolerated treatment well    Behavior During Therapy [facility] for tasks assessed/performed           Past Medical History:  Diagnosis Date  . ADD (attention deficit disorder)   . Anemia   . Anginal pain ([place])    pt states has occas chest pain relates to indigestion; pt uses rest to relieve DR [place]-   . Anxiety   . Arthritis   . Concussion   . Depression   . Diabetes mellitus without complication ([place])    type 2   . Dizziness   . Fall   . GERD (gastroesophageal reflux disease)   . Headache   . History of urinary tract infection   . Hyperlipidemia   . Hypertension   . IBS (irritable bowel syndrome)   . Imbalance   . Numbness    right leg   . Numbness in both hands    comes and goes   . Pneumonia    last episode approx 1 year ago   . Sleep apnea    uses cpap not all the time   . Wears glasses     Past Surgical History:  Procedure Laterality Date  . BREAST CYST ASPIRATION Left 1980's   neg  . BREAST CYST EXCISION Right 1980's   neg  . BREAST LUMPECTOMY Right   . BREAST SURGERY    . CARPAL TUNNEL RELEASE    . CESAREAN SECTION     times 2  . COLONOSCOPY WITH PROPOFOL N/A 05/11/2015   Procedure: COLONOSCOPY WITH PROPOFOL;  Surgeon: Manya Silvas, MD;  Location[facility] ENDOSCOPY;  Service: Endoscopy;  Laterality: N/A;  . DE  QUERVAIN'S RELEASE Right   . ESOPHAGOGASTRODUODENOSCOPY (EGD) WITH PROPOFOL N/A 05/11/2015   Procedure: ESOPHAGOGASTRODUODENOSCOPY (EGD) WITH PROPOFOL;  Surgeon: Manya Silvas, MD;  Location: [facility] ENDOSCOPY;  Service: Endoscopy;  Laterality: N/A;  . EYE SURGERY     laser surgery bilat   . HERNIA REPAIR    . KNEE ARTHROSCOPY    . LUMBAR LAMINECTOMY/DECOMPRESSION MICRODISCECTOMY Bilateral 09/14/2015   Procedure: MICRO LUMBAR BILATERAL DECOMPRESSION L4 - L5;  Surgeon: Susa Day, MD;  Location: [facility]S;  Service: Orthopedics;  Laterality: Bilateral;  . REDUCTION MAMMAPLASTY Bilateral 1980  . RHINOPLASTY    . TONSILLECTOMY    . TOTAL KNEE ARTHROPLASTY Left 06/18/2016   Procedure: LEFT TOTAL KNEE ARTHROPLASTY;  Surgeon: Paralee Cancel, MD;  Location: [facility];  Service: Orthopedics;  Laterality: Left;  Adductor Block  . TOTAL KNEE ARTHROPLASTY Right 02/11/2020   Procedure: TOTAL KNEE ARTHROPLASTY;  Surgeon: Paralee Cancel, MD;  Location: [facility]S;  Service: Orthopedics;  Laterality: Right;  . TUBAL LIGATION    . UVULOPALATOPHARYNGOPLASTY      There were no vitals  filed for this visit.   Subjective Assessment - 05/05/20 1333    Subjective R knee is still pretty sore. It just hurts. 5.5/10 lateral knee currently. Dr. Felizardo Hoffmann was pleased with the way the knee looked as well as the extension ROM. MD only wrote pt for another 4 weeks. Pt wants to be able to get into and out of the shower and truck wiht her knee better (improve knee flexion).    Pertinent History S/P R TKA on 02/11/2020. Pt is currently 3 weeks post op. R knee does not feel great. Has not had PT since leaving the hospital. Has been doing ankle pumps as well as walking with her RW.    Patient Stated Goals Be better able to get into and out of her vehicle (midsized truck), into and out of chairs, be better able to roll in her bed with less L knee pain. Be able to get down on the floor and get back up.     Currently in Pain? Yes    Pain Score  6                                      PT Education - 05/05/20 1341    Education Details ther-ex    Person(s) Educated Patient    Methods Explanation;Demonstration;Tactile cues;Verbal cues    Comprehension Returned demonstration;Verbalized understanding          Objective   Swelling, scab formation, healing satisfactorily  s/p R TKR12/16/2021   S/P9-10wks  MedbridgeAccess Code [email or web address]    Therapeutic Exercise  Supine quad set with over pressure 10x5 seconds for 3 sets to promote leg extension AROM  Seated LAQ 3 lbs R 10x3  Heel walking with one UE assist 5 ft x6  standing R knee flexion stretch R knee with B UE assist 10x5 seconds for 2 sets  Forward step up onto first regular step with L UE assist 10x, then 8x with R LE  Standing bilateral gastroc stretch as stair step with B UE assist 30 seconds x 3  Sit <>stand from chair with arms without UE assist, arms crossed 10x. Able to perform    Improved exercise technique, movement at target joints, use of target muscles after mod verbal, visual, tactile cues.  Manual therapy Supine medial glide L patella grade 3 to promote mobility   Supine STM medial hamstrings to decrease tension      Response to treatment Pt tolerated session well without aggravation of symptoms.    Clinical impresssion Pt currently able to ambulate independently without SPC today and no LOB. Continued working on knee extension and flexion ROM to decrease stiffness. Pt tolerated session well without aggravation of symptoms. Pt will benefit from continued skilled physical therapy services to improve ROM, strength, and function.       PT Short Term Goals - 04/07/20 1440      PT SHORT TERM GOAL #1   Title Patient will be independent with her initial HEP to improve R knee ROM, strength, and decrease difficulty with gait.    Baseline Pt has started her HEP (03/03/2020)    Time 3    Period  Weeks    Status Achieved    Target Date 03/24/20             PT Long Term Goals - 04/12/20 1312      PT LONG TERM GOAL #1  Title Patient will improve R knee flexion AROM to 120 degrees to promote ability to ambulate and negotiate stairs with less difficulty.    Baseline 76 degrees setaed R knee flexion AROM (03/03/2020); 115 degrees flexion 04/07/20; 115 degrees (04/12/2020)    Time 12    Period Weeks    Status Partially Met    Target Date 05/26/20      PT LONG TERM GOAL #2   Title Patient will improve R knee extension AROM to -5 degrees or more to promote ability to ambulate as well as perform standing tasks with less difficulty.    Baseline -28 degrees R knee extension AROM (03/03/2020); -15 degrees on 04/07/20; -11 degrees (04/12/2020)    Time 12    Period Weeks    Status On-going    Target Date 05/26/20      PT LONG TERM GOAL #3   Title Pt will improve R knee flexion and extension strength to 5/5 to promote ability to perform standing tasks with less difficulty.    Baseline 4-/5 R knee flexion and 4/5 extension (03/03/2020); 5/5 R knee flexion, 5/5 knee extension (04/12/2020)    Time 12    Period Weeks    Status Achieved    Target Date 05/26/20      PT LONG TERM GOAL #4   Title Pt will be able to ambulate at least 300 ft independently and without LOB to promote mobility.    Baseline Pt currently ambulating with rw (03/03/2020); Pt currently ambulating with SPC (04/12/2020)    Time 12    Period Weeks    Status Partially Met    Target Date 05/26/20      PT LONG TERM GOAL #5   Title Pt will improve her R knee FOTO score by at least 10 points as a demonstration of improved function.    Baseline Pt was unable to fill out FOTO at eval (03/03/2020); FOTO not appropriate secondary to memory challenges (04/12/2020)    Time 12    Period Weeks    Status On-going    Target Date 05/26/20                 Plan - 05/05/20 1343    Clinical Impression Statement Pt currently able to  ambulate independently without SPC today and no LOB. Continued working on knee extension and flexion ROM to decrease stiffness. Pt tolerated session well without aggravation of symptoms. Pt will benefit from continued skilled physical therapy services to improve ROM, strength, and function.    Personal Factors and Comorbidities Comorbidity 3+;Age;Fitness;Past/Current Experience    Comorbidities DM, dizziness, HTN, memory    Examination-Activity Limitations Squat;Stairs;Bed Mobility;Bend;Locomotion Level;Stand;Carry;Transfers    Stability/Clinical Decision Making Stable/Uncomplicated    Clinical Decision Making Low    Rehab Potential Fair    Clinical Impairments Affecting Rehab Potential (-) multiple areas of chronic pain, age, memory. (+) motivated    PT Frequency 1x / week    PT Duration 12 weeks    PT Treatment/Interventions Therapeutic activities;Therapeutic exercise;Balance training;Neuromuscular re-education;Patient/family education;Manual techniques;Dry needling;Vestibular;Aquatic Therapy;Canalith Repostioning;Electrical Stimulation;Iontophoresis 39m/ml Dexamethasone;Gait training;Stair training    PT Next Visit Plan ROM, hip and knee strengthening, gait, manual techniques, modalities PRN    PT Home Exercise Plan Medbridge Access Code [email or web address]   Consulted and Agree with Plan of Care Patient           Patient will benefit from skilled therapeutic intervention in order to improve the following deficits and impairments:  Pain,Improper body mechanics,Postural  dysfunction,Difficulty walking,Decreased strength,Decreased scar mobility,Decreased range of motion,Abnormal gait  Visit Diagnosis: Chronic pain of right knee  Difficulty in walking, not elsewhere classified  Muscle weakness (generalized)     Problem List Patient Active Problem List   Diagnosis Date Noted  . Osteoarthritis of right knee 02/12/2020  . S/P total knee arthroplasty, right 02/11/2020  . Obese 06/20/2016  .  Status post right knee replacement 06/19/2016  . S/P left TKA 06/18/2016  . Spinal stenosis of lumbar region 09/14/2015    Joneen Boers PT, DPT   05/05/2020, 3:18 PM  [place] [facility] [address] [place], [address] Phone: [phone]   Fax:  [phone]  Name: Destiny Shaw MRN: [id number] Date of Birth: 1950-10-27

## 2020-05-10 ENCOUNTER — Ambulatory Visit: Payer: Medicare Other

## 2020-05-10 ENCOUNTER — Other Ambulatory Visit: Payer: Self-pay

## 2020-05-10 DIAGNOSIS — R262 Difficulty in walking, not elsewhere classified: Secondary | ICD-10-CM

## 2020-05-10 DIAGNOSIS — G8929 Other chronic pain: Secondary | ICD-10-CM

## 2020-05-10 DIAGNOSIS — M6281 Muscle weakness (generalized): Secondary | ICD-10-CM

## 2020-05-10 DIAGNOSIS — M25561 Pain in right knee: Secondary | ICD-10-CM | POA: Diagnosis not present

## 2020-05-10 NOTE — Therapy (Signed)
[facility] [address] [place], [address] Phone: [phone]   Fax:  [phone]  Physical Therapy Treatment  Patient Details  Name: Destiny Shaw MRN: [id number] Date of Birth: 1950-07-17 Referring Provider (PT): Paralee Cancel, MD   Encounter Date: 05/10/2020   PT End of Session - 05/10/20 1339    Visit Number 18    Number of Visits 25    Date for PT Re-Evaluation 05/26/20    Authorization Type 8    Authorization Time Period 10    PT Start Time 1339    PT Stop Time 1497    PT Time Calculation (min) 26 min    Activity Tolerance Patient tolerated treatment well    Behavior During Therapy [facility] for tasks assessed/performed           Past Medical History:  Diagnosis Date  . ADD (attention deficit disorder)   . Anemia   . Anginal pain ([place])    pt states has occas chest pain relates to indigestion; pt uses rest to relieve DR Montague-   . Anxiety   . Arthritis   . Concussion   . Depression   . Diabetes mellitus without complication ([place])    type 2   . Dizziness   . Fall   . GERD (gastroesophageal reflux disease)   . Headache   . History of urinary tract infection   . Hyperlipidemia   . Hypertension   . IBS (irritable bowel syndrome)   . Imbalance   . Numbness    right leg   . Numbness in both hands    comes and goes   . Pneumonia    last episode approx 1 year ago   . Sleep apnea    uses cpap not all the time   . Wears glasses     Past Surgical History:  Procedure Laterality Date  . BREAST CYST ASPIRATION Left 1980's   neg  . BREAST CYST EXCISION Right 1980's   neg  . BREAST LUMPECTOMY Right   . BREAST SURGERY    . CARPAL TUNNEL RELEASE    . CESAREAN SECTION     times 2  . COLONOSCOPY WITH PROPOFOL N/A 05/11/2015   Procedure: COLONOSCOPY WITH PROPOFOL;  Surgeon: Manya Silvas, MD;  Location: [facility] ENDOSCOPY;  Service: Endoscopy;  Laterality: N/A;  . DE  QUERVAIN'S RELEASE Right   . ESOPHAGOGASTRODUODENOSCOPY (EGD) WITH PROPOFOL N/A 05/11/2015   Procedure: ESOPHAGOGASTRODUODENOSCOPY (EGD) WITH PROPOFOL;  Surgeon: Manya Silvas, MD;  Location: [facility] ENDOSCOPY;  Service: Endoscopy;  Laterality: N/A;  . EYE SURGERY     laser surgery bilat   . HERNIA REPAIR    . KNEE ARTHROSCOPY    . LUMBAR LAMINECTOMY/DECOMPRESSION MICRODISCECTOMY Bilateral 09/14/2015   Procedure: MICRO LUMBAR BILATERAL DECOMPRESSION L4 - L5;  Surgeon: Susa Day, MD;  Location: [facility] ORS;  Service: Orthopedics;  Laterality: Bilateral;  . REDUCTION MAMMAPLASTY Bilateral 1980  . RHINOPLASTY    . TONSILLECTOMY    . TOTAL KNEE ARTHROPLASTY Left 06/18/2016   Procedure: LEFT TOTAL KNEE ARTHROPLASTY;  Surgeon: Paralee Cancel, MD;  Location: [facility] ORS;  Service: Orthopedics;  Laterality: Left;  Adductor Block  . TOTAL KNEE ARTHROPLASTY Right 02/11/2020   Procedure: TOTAL KNEE ARTHROPLASTY;  Surgeon: Paralee Cancel, MD;  Location: [facility] ORS;  Service: Orthopedics;  Laterality: Right;  . TUBAL LIGATION    . UVULOPALATOPHARYNGOPLASTY      There were no vitals  filed for this visit.   Subjective Assessment - 05/10/20 1341    Subjective R knee has been hurting (anterior lateral), about 6.5/10, not horrible.    Pertinent History S/P R TKA on 02/11/2020. Pt is currently 3 weeks post op. R knee does not feel great. Has not had PT since leaving the hospital. Has been doing ankle pumps as well as walking with her RW.    Patient Stated Goals Be better able to get into and out of her vehicle (midsized truck), into and out of chairs, be better able to roll in her bed with less L knee pain. Be able to get down on the floor and get back up.     Currently in Pain? Yes    Pain Score 6                                      PT Education - 05/10/20 1343    Education Details ther-ex    Person(s) Educated Patient    Methods Explanation;Demonstration;Tactile cues;Verbal cues     Comprehension Returned demonstration;Verbalized understanding           Objective   Swelling, scab formation, healing satisfactorily  s/p R TKR12/16/2021   S/P9-10wks  MedbridgeAccess Code [email or web address]    Therapeutic Exercise  Standing knee flexion stretch at 2nd step with B UE assist 10x5 seconds for 3 sets   standing gastroc stretch at first stair step with B UE assist 30 seconds x 3  Ascending and descending 4 regular steps with B UE assist 5x, emphasis on femoral control   Supine quad set with over pressure 10x5 seconds for3sets to promote leg extension AROM  Seated LAQ 3 lbs R 10x3   Improved exercise technique, movement at target joints, use of target muscles after mod verbal, visual, tactile cues.    Response to treatment Pt tolerated session well without aggravation of symptoms.    Clinical impresssion Pt arrived late so session adjusted accordingly.  Continued working on knee flexion and extension ROM to decrease stiffness to improve ability to ambulate as well as negotiate stairs with less difficulty. Pt observed to arrive at clinic without AD. Able to ambulate independently without LOB. Pt tolerated session well without aggravation of symptoms. Pt will benefit from continued skilled physical therapy services to decreased stiffness, improve ROM, strength and function.          PT Short Term Goals - 04/07/20 1440      PT SHORT TERM GOAL #1   Title Patient will be independent with her initial HEP to improve R knee ROM, strength, and decrease difficulty with gait.    Baseline Pt has started her HEP (03/03/2020)    Time 3    Period Weeks    Status Achieved    Target Date 03/24/20             PT Long Term Goals - 04/12/20 1312      PT LONG TERM GOAL #1   Title Patient will improve R knee flexion AROM to 120 degrees to promote ability to ambulate and negotiate stairs with less difficulty.    Baseline 76 degrees setaed R knee  flexion AROM (03/03/2020); 115 degrees flexion 04/07/20; 115 degrees (04/12/2020)    Time 12    Period Weeks    Status Partially Met    Target Date 05/26/20      PT LONG TERM GOAL #  2   Title Patient will improve R knee extension AROM to -5 degrees or more to promote ability to ambulate as well as perform standing tasks with less difficulty.    Baseline -28 degrees R knee extension AROM (03/03/2020); -15 degrees on 04/07/20; -11 degrees (04/12/2020)    Time 12    Period Weeks    Status On-going    Target Date 05/26/20      PT LONG TERM GOAL #3   Title Pt will improve R knee flexion and extension strength to 5/5 to promote ability to perform standing tasks with less difficulty.    Baseline 4-/5 R knee flexion and 4/5 extension (03/03/2020); 5/5 R knee flexion, 5/5 knee extension (04/12/2020)    Time 12    Period Weeks    Status Achieved    Target Date 05/26/20      PT LONG TERM GOAL #4   Title Pt will be able to ambulate at least 300 ft independently and without LOB to promote mobility.    Baseline Pt currently ambulating with rw (03/03/2020); Pt currently ambulating with SPC (04/12/2020)    Time 12    Period Weeks    Status Partially Met    Target Date 05/26/20      PT LONG TERM GOAL #5   Title Pt will improve her R knee FOTO score by at least 10 points as a demonstration of improved function.    Baseline Pt was unable to fill out FOTO at eval (03/03/2020); FOTO not appropriate secondary to memory challenges (04/12/2020)    Time 12    Period Weeks    Status On-going    Target Date 05/26/20                 Plan - 05/10/20 1344    Clinical Impression Statement Pt arrived late so session adjusted accordingly.  Continued working on knee flexion and extension ROM to decrease stiffness to improve ability to ambulate as well as negotiate stairs with less difficulty. Pt observed to arrive at clinic without AD. Able to ambulate independently without LOB. Pt tolerated session well without  aggravation of symptoms. Pt will benefit from continued skilled physical therapy services to decreased stiffness, improve ROM, strength and function.    Personal Factors and Comorbidities Comorbidity 3+;Age;Fitness;Past/Current Experience    Comorbidities DM, dizziness, HTN, memory    Examination-Activity Limitations Squat;Stairs;Bed Mobility;Bend;Locomotion Level;Stand;Carry;Transfers    Stability/Clinical Decision Making Stable/Uncomplicated    Clinical Decision Making Low    Rehab Potential Fair    Clinical Impairments Affecting Rehab Potential (-) multiple areas of chronic pain, age, memory. (+) motivated    PT Frequency 1x / week    PT Duration 12 weeks    PT Treatment/Interventions Therapeutic activities;Therapeutic exercise;Balance training;Neuromuscular re-education;Patient/family education;Manual techniques;Dry needling;Vestibular;Aquatic Therapy;Canalith Repostioning;Electrical Stimulation;Iontophoresis 68m/ml Dexamethasone;Gait training;Stair training    PT Next Visit Plan ROM, hip and knee strengthening, gait, manual techniques, modalities PRN    PT Home Exercise Plan Medbridge Access Code [email or web address]   Consulted and Agree with Plan of Care Patient           Patient will benefit from skilled therapeutic intervention in order to improve the following deficits and impairments:  Pain,Improper body mechanics,Postural dysfunction,Difficulty walking,Decreased strength,Decreased scar mobility,Decreased range of motion,Abnormal gait  Visit Diagnosis: Chronic pain of right knee  Difficulty in walking, not elsewhere classified  Muscle weakness (generalized)     Problem List Patient Active Problem List   Diagnosis Date Noted  . Osteoarthritis of  right knee 02/12/2020  . S/P total knee arthroplasty, right 02/11/2020  . Obese 06/20/2016  . Status post right knee replacement 06/19/2016  . S/P left TKA 06/18/2016  . Spinal stenosis of lumbar region 09/14/2015     Aristide Waggle 05/10/2020, 7:19 PM  [facility] [address] [place], [address] Phone: [phone]   Fax:  [phone]  Name: Destiny Shaw MRN: [id number] Date of Birth: 1951/01/05

## 2020-05-12 ENCOUNTER — Other Ambulatory Visit: Payer: Self-pay

## 2020-05-12 ENCOUNTER — Ambulatory Visit: Payer: Medicare Other

## 2020-05-12 DIAGNOSIS — G8929 Other chronic pain: Secondary | ICD-10-CM

## 2020-05-12 DIAGNOSIS — M25561 Pain in right knee: Secondary | ICD-10-CM | POA: Diagnosis not present

## 2020-05-12 DIAGNOSIS — R262 Difficulty in walking, not elsewhere classified: Secondary | ICD-10-CM

## 2020-05-12 DIAGNOSIS — M6281 Muscle weakness (generalized): Secondary | ICD-10-CM

## 2020-05-12 NOTE — Therapy (Signed)
[facility] [address] [place], [address] Phone: [phone]   Fax:  [phone]  Physical Therapy Treatment  Patient Details  Name: Destiny Shaw MRN: [id number] Date of Birth: 20-May-1950 Referring Provider (PT): Paralee Cancel, MD   Encounter Date: 05/12/2020   PT End of Session - 05/12/20 1331    Visit Number 19    Number of Visits 25    Date for PT Re-Evaluation 05/26/20    Authorization Type 9    Authorization Time Period 10    PT Start Time 1331    PT Stop Time 1415    PT Time Calculation (min) 44 min    Activity Tolerance Patient tolerated treatment well    Behavior During Therapy [facility] for tasks assessed/performed           Past Medical History:  Diagnosis Date  . ADD (attention deficit disorder)   . Anemia   . Anginal pain ([place])    pt states has occas chest pain relates to indigestion; pt uses rest to relieve DR Naylor-   . Anxiety   . Arthritis   . Concussion   . Depression   . Diabetes mellitus without complication ([place])    type 2   . Dizziness   . Fall   . GERD (gastroesophageal reflux disease)   . Headache   . History of urinary tract infection   . Hyperlipidemia   . Hypertension   . IBS (irritable bowel syndrome)   . Imbalance   . Numbness    right leg   . Numbness in both hands    comes and goes   . Pneumonia    last episode approx 1 year ago   . Sleep apnea    uses cpap not all the time   . Wears glasses     Past Surgical History:  Procedure Laterality Date  . BREAST CYST ASPIRATION Left 1980's   neg  . BREAST CYST EXCISION Right 1980's   neg  . BREAST LUMPECTOMY Right   . BREAST SURGERY    . CARPAL TUNNEL RELEASE    . CESAREAN SECTION     times 2  . COLONOSCOPY WITH PROPOFOL N/A 05/11/2015   Procedure: COLONOSCOPY WITH PROPOFOL;  Surgeon: Manya Silvas, MD;  Location: [facility] ENDOSCOPY;  Service: Endoscopy;  Laterality: N/A;  . DE  QUERVAIN'S RELEASE Right   . ESOPHAGOGASTRODUODENOSCOPY (EGD) WITH PROPOFOL N/A 05/11/2015   Procedure: ESOPHAGOGASTRODUODENOSCOPY (EGD) WITH PROPOFOL;  Surgeon: Manya Silvas, MD;  Location: [facility] ENDOSCOPY;  Service: Endoscopy;  Laterality: N/A;  . EYE SURGERY     laser surgery bilat   . HERNIA REPAIR    . KNEE ARTHROSCOPY    . LUMBAR LAMINECTOMY/DECOMPRESSION MICRODISCECTOMY Bilateral 09/14/2015   Procedure: MICRO LUMBAR BILATERAL DECOMPRESSION L4 - L5;  Surgeon: Susa Day, MD;  Location: [facility] ORS;  Service: Orthopedics;  Laterality: Bilateral;  . REDUCTION MAMMAPLASTY Bilateral 1980  . RHINOPLASTY    . TONSILLECTOMY    . TOTAL KNEE ARTHROPLASTY Left 06/18/2016   Procedure: LEFT TOTAL KNEE ARTHROPLASTY;  Surgeon: Paralee Cancel, MD;  Location: [facility] ORS;  Service: Orthopedics;  Laterality: Left;  Adductor Block  . TOTAL KNEE ARTHROPLASTY Right 02/11/2020   Procedure: TOTAL KNEE ARTHROPLASTY;  Surgeon: Paralee Cancel, MD;  Location: [facility]S;  Service: Orthopedics;  Laterality: Right;  . TUBAL LIGATION    . UVULOPALATOPHARYNGOPLASTY      There were no vitals  filed for this visit.   Subjective Assessment - 05/12/20 1828    Subjective Pt states that she wants to organize her binder of HEP to help her do her exercises when she graduates from PT.    Pertinent History S/P R TKA on 02/11/2020. Pt is currently 3 weeks post op. R knee does not feel great. Has not had PT since leaving the hospital. Has been doing ankle pumps as well as walking with her RW.    Patient Stated Goals Be better able to get into and out of her vehicle (midsized truck), into and out of chairs, be better able to roll in her bed with less L knee pain. Be able to get down on the floor and get back up.     Currently in Pain? Other (Comment)   No pain level mentioned.                                    PT Education - 05/12/20 1829    Education Details ther-ex, HEP    Person(s) Educated Patient     Methods Explanation;Demonstration;Tactile cues;Verbal cues    Comprehension Returned demonstration;Verbalized understanding          Objective   Swelling, scab formation, healing satisfactorily  s/p R TKR12/16/2021   S/P13wks  MedbridgeAccess Code [email or web address]    Therapeutic Exercise  Reviewed HEP for the knee including previous bout of PT for L knee. Pt brought her binder of HEP. Increased time secondary to about 2 years worth of organizing. Skilled service secondary to all of the exercises for the binder were organized based on knee, back, and vestibular rehab. Also reviewed some of the exercises in the binder. Pt verbalized understanding.   Standing knee flexion stretch at 2nd step with B UE assist 10x5 seconds   Supine quad set 10x5 seconds   Performed aforementioned exercises to promote knee flexion and extension AROM and to review with pt the exercises in her binder.    Seated R knee AROM  Extension -8 degrees  Flexion 117 degrees  Improved exercise technique, movement at target joints, use of target muscles after mod verbal, visual, tactile cues.    Response to treatment Pt tolerated session well without aggravation of symptoms.   Clinical impresssion Improving R knee flexion and extension AROM. Organized pt binder full of HEP into sections for knee, back, and vestibular rehab to promote better ability to follow her home program during rehab and after graduating PT. Pt tolerated session well without aggravation of symptoms. Pt will benefit from continued skilled physical therapy services to improve ROM, strength and function.       PT Short Term Goals - 04/07/20 1440      PT SHORT TERM GOAL #1   Title Patient will be independent with her initial HEP to improve R knee ROM, strength, and decrease difficulty with gait.    Baseline Pt has started her HEP (03/03/2020)    Time 3    Period Weeks    Status Achieved    Target Date 03/24/20              PT Long Term Goals - 04/12/20 1312      PT LONG TERM GOAL #1   Title Patient will improve R knee flexion AROM to 120 degrees to promote ability to ambulate and negotiate stairs with less difficulty.    Baseline 76 degrees setaed R knee  flexion AROM (03/03/2020); 115 degrees flexion 04/07/20; 115 degrees (04/12/2020)    Time 12    Period Weeks    Status Partially Met    Target Date 05/26/20      PT LONG TERM GOAL #2   Title Patient will improve R knee extension AROM to -5 degrees or more to promote ability to ambulate as well as perform standing tasks with less difficulty.    Baseline -28 degrees R knee extension AROM (03/03/2020); -15 degrees on 04/07/20; -11 degrees (04/12/2020)    Time 12    Period Weeks    Status On-going    Target Date 05/26/20      PT LONG TERM GOAL #3   Title Pt will improve R knee flexion and extension strength to 5/5 to promote ability to perform standing tasks with less difficulty.    Baseline 4-/5 R knee flexion and 4/5 extension (03/03/2020); 5/5 R knee flexion, 5/5 knee extension (04/12/2020)    Time 12    Period Weeks    Status Achieved    Target Date 05/26/20      PT LONG TERM GOAL #4   Title Pt will be able to ambulate at least 300 ft independently and without LOB to promote mobility.    Baseline Pt currently ambulating with rw (03/03/2020); Pt currently ambulating with SPC (04/12/2020)    Time 12    Period Weeks    Status Partially Met    Target Date 05/26/20      PT LONG TERM GOAL #5   Title Pt will improve her R knee FOTO score by at least 10 points as a demonstration of improved function.    Baseline Pt was unable to fill out FOTO at eval (03/03/2020); FOTO not appropriate secondary to memory challenges (04/12/2020)    Time 12    Period Weeks    Status On-going    Target Date 05/26/20                 Plan - 05/12/20 1328    Clinical Impression Statement Improving R knee flexion and extension AROM. Organized pt binder full of HEP into  sections for knee, back, and vestibular rehab to promote better ability to follow her home program during rehab and after graduating PT. Pt tolerated session well without aggravation of symptoms. Pt will benefit from continued skilled physical therapy services to improve ROM, strength and function.    Personal Factors and Comorbidities Comorbidity 3+;Age;Fitness;Past/Current Experience    Comorbidities DM, dizziness, HTN, memory    Examination-Activity Limitations Squat;Stairs;Bed Mobility;Bend;Locomotion Level;Stand;Carry;Transfers    Stability/Clinical Decision Making Stable/Uncomplicated    Clinical Decision Making Low    Rehab Potential Fair    Clinical Impairments Affecting Rehab Potential (-) multiple areas of chronic pain, age, memory. (+) motivated    PT Frequency 1x / week    PT Duration 12 weeks    PT Treatment/Interventions Therapeutic activities;Therapeutic exercise;Balance training;Neuromuscular re-education;Patient/family education;Manual techniques;Dry needling;Vestibular;Aquatic Therapy;Canalith Repostioning;Electrical Stimulation;Iontophoresis 73m/ml Dexamethasone;Gait training;Stair training    PT Next Visit Plan ROM, hip and knee strengthening, gait, manual techniques, modalities PRN    PT Home Exercise Plan Medbridge Access Code [email or web address]   Consulted and Agree with Plan of Care Patient           Patient will benefit from skilled therapeutic intervention in order to improve the following deficits and impairments:  Pain,Improper body mechanics,Postural dysfunction,Difficulty walking,Decreased strength,Decreased scar mobility,Decreased range of motion,Abnormal gait  Visit Diagnosis: Chronic pain of right knee  Difficulty in walking, not elsewhere classified  Muscle weakness (generalized)     Problem List Patient Active Problem List   Diagnosis Date Noted  . Osteoarthritis of right knee 02/12/2020  . S/P total knee arthroplasty, right 02/11/2020  . Obese 06/20/2016   . Status post right knee replacement 06/19/2016  . S/P left TKA 06/18/2016  . Spinal stenosis of lumbar region 09/14/2015    Joneen Boers PT, DPT   05/12/2020, 6:36 PM  [facility] [place] [facility] [address] [place], [address] Phone: [phone]   Fax:  [phone]  Name: Destiny Shaw MRN: [id number] Date of Birth: 1951/01/21

## 2020-05-17 ENCOUNTER — Other Ambulatory Visit: Payer: Self-pay

## 2020-05-17 ENCOUNTER — Ambulatory Visit: Payer: Medicare Other

## 2020-05-17 DIAGNOSIS — M25561 Pain in right knee: Secondary | ICD-10-CM | POA: Diagnosis not present

## 2020-05-17 DIAGNOSIS — R262 Difficulty in walking, not elsewhere classified: Secondary | ICD-10-CM

## 2020-05-17 DIAGNOSIS — G8929 Other chronic pain: Secondary | ICD-10-CM

## 2020-05-17 DIAGNOSIS — M6281 Muscle weakness (generalized): Secondary | ICD-10-CM

## 2020-05-17 NOTE — Therapy (Signed)
[place] [facility] [address] [place], [address] Phone: [phone]   Fax:  [phone]  Physical Therapy Treatment And Progress Report (04/12/2020 - 05/17/2020)  Patient Details  Name: Destiny Shaw MRN: [id number] Date of Birth: 1950/11/17 Referring Provider (PT): Paralee Cancel, MD   Encounter Date: 05/17/2020   PT End of Session - 05/17/20 1335    Visit Number 20    Number of Visits 25    Date for PT Re-Evaluation 05/26/20    Authorization Type 10    Authorization Time Period 10    PT Start Time 1335    PT Stop Time 1424    PT Time Calculation (min) 49 min    Activity Tolerance Patient tolerated treatment well    Behavior During Therapy [facility] for tasks assessed/performed           Past Medical History:  Diagnosis Date  . ADD (attention deficit disorder)   . Anemia   . Anginal pain ([place])    pt states has occas chest pain relates to indigestion; pt uses rest to relieve DR Garden-   . Anxiety   . Arthritis   . Concussion   . Depression   . Diabetes mellitus without complication ([place])    type 2   . Dizziness   . Fall   . GERD (gastroesophageal reflux disease)   . Headache   . History of urinary tract infection   . Hyperlipidemia   . Hypertension   . IBS (irritable bowel syndrome)   . Imbalance   . Numbness    right leg   . Numbness in both hands    comes and goes   . Pneumonia    last episode approx 1 year ago   . Sleep apnea    uses cpap not all the time   . Wears glasses     Past Surgical History:  Procedure Laterality Date  . BREAST CYST ASPIRATION Left 1980's   neg  . BREAST CYST EXCISION Right 1980's   neg  . BREAST LUMPECTOMY Right   . BREAST SURGERY    . CARPAL TUNNEL RELEASE    . CESAREAN SECTION     times 2  . COLONOSCOPY WITH PROPOFOL N/A 05/11/2015   Procedure: COLONOSCOPY WITH PROPOFOL;  Surgeon: Manya Silvas, MD;  Location: [facility] ENDOSCOPY;   Service: Endoscopy;  Laterality: N/A;  . DE QUERVAIN'S RELEASE Right   . ESOPHAGOGASTRODUODENOSCOPY (EGD) WITH PROPOFOL N/A 05/11/2015   Procedure: ESOPHAGOGASTRODUODENOSCOPY (EGD) WITH PROPOFOL;  Surgeon: Manya Silvas, MD;  Location: [facility] ENDOSCOPY;  Service: Endoscopy;  Laterality: N/A;  . EYE SURGERY     laser surgery bilat   . HERNIA REPAIR    . KNEE ARTHROSCOPY    . LUMBAR LAMINECTOMY/DECOMPRESSION MICRODISCECTOMY Bilateral 09/14/2015   Procedure: MICRO LUMBAR BILATERAL DECOMPRESSION L4 - L5;  Surgeon: Susa Day, MD;  Location: [facility] ORS;  Service: Orthopedics;  Laterality: Bilateral;  . REDUCTION MAMMAPLASTY Bilateral 1980  . RHINOPLASTY    . TONSILLECTOMY    . TOTAL KNEE ARTHROPLASTY Left 06/18/2016   Procedure: LEFT TOTAL KNEE ARTHROPLASTY;  Surgeon: Paralee Cancel, MD;  Location: [facility] ORS;  Service: Orthopedics;  Laterality: Left;  Adductor Block  . TOTAL KNEE ARTHROPLASTY Right 02/11/2020   Procedure: TOTAL KNEE ARTHROPLASTY;  Surgeon: Paralee Cancel, MD;  Location: [facility] ORS;  Service: Orthopedics;  Laterality: Right;  . TUBAL LIGATION    . UVULOPALATOPHARYNGOPLASTY  There were no vitals filed for this visit.   Subjective Assessment - 05/17/20 1336    Subjective R knee is a whole lot better than it was yesterday. Yesterday R knee and back bothered. Does not know what happened. Pt was on her feet a lot the day before her pain started. L low back is a 7/10 currently. Yesterday was an 8-9/10 L low back. Yesterday and the day before, pt was trying to organize stuff in her house.    Pertinent History S/P R TKA on 02/11/2020. Pt is currently 3 weeks post op. R knee does not feel great. Has not had PT since leaving the hospital. Has been doing ankle pumps as well as walking with her RW.    Patient Stated Goals Be better able to get into and out of her vehicle (midsized truck), into and out of chairs, be better able to roll in her bed with less L knee pain. Be able to get down on the floor  and get back up.     Currently in Pain? Yes    Pain Score 6     Pain Location Knee    Pain Orientation Right                                     PT Education - 05/17/20 1429    Education Details ther-ex    Person(s) Educated Patient    Methods Explanation;Demonstration;Tactile cues;Verbal cues    Comprehension Returned demonstration;Verbalized understanding           Objective   Swelling, scab formation, healing satisfactorily  s/p R TKR12/16/2021    MedbridgeAccess Code [email or web address]  Manual therapy Seated STM to L anterior knee/proximal tibia to decrease fascial restrictions  Seated STM R vastus lateralis to decrease muscle tension     Therapeutic Exercise Seated R knee flexion AAROM with PT 10x3 with 5 second holds   Forward step up onto first regular step with B UE assist 10x2 R LE  Seated trunk flexion 10x2 with 5 second holds   Pt states exercise helps with her back.   Standing ankle DF/PF 2 min with B UE assist   Increased time secondary to slow movement due to back pain  Seated manually resisted L lateral shift isometrics in neutral secondary to R lateral shift posture 10x5 seconds for 3 sets        Improved exercise technique, movement at target joints, use of target muscles after mod verbal, visual, tactile cues.    Response to treatment Fair tolerance to today's session. Decreased R knee pain after session. Pt still demonstrates low back symptoms.    Clinical impresssion Pt demonstrates overall improving R knee flexion and extension ROM and currently able to ambulate short distances without AD. Difficulty with gait secondary to recent onset of acute on chronic low back pain. Decreased R knee pain after session but still demonstrates low back symptoms. Pt will benefit from continued skilled physical therapy services to decrease pain, improve strength and function.         PT Short Term Goals -  04/07/20 1440      PT SHORT TERM GOAL #1   Title Patient will be independent with her initial HEP to improve R knee ROM, strength, and decrease difficulty with gait.    Baseline Pt has started her HEP (03/03/2020)    Time 3    Period Weeks  Status Achieved    Target Date 03/24/20             PT Long Term Goals - 05/17/20 1341      PT LONG TERM GOAL #1   Title Patient will improve R knee flexion AROM to 120 degrees to promote ability to ambulate and negotiate stairs with less difficulty.    Baseline 76 degrees setaed R knee flexion AROM (03/03/2020); 115 degrees flexion 04/07/20; 115 degrees (04/12/2020); 115 decrees R knee flexion (05/17/2020)    Time 12    Period Weeks    Status Partially Met    Target Date 05/26/20      PT LONG TERM GOAL #2   Title Patient will improve R knee extension AROM to -5 degrees or more to promote ability to ambulate as well as perform standing tasks with less difficulty.    Baseline -28 degrees R knee extension AROM (03/03/2020); -15 degrees on 04/07/20; -11 degrees (04/12/2020); -9 R knee extenssion AROM (05/17/2020).    Time 12    Period Weeks    Status On-going    Target Date 05/26/20      PT LONG TERM GOAL #3   Title Pt will improve R knee flexion and extension strength to 5/5 to promote ability to perform standing tasks with less difficulty.    Baseline 4-/5 R knee flexion and 4/5 extension (03/03/2020); 5/5 R knee flexion, 5/5 knee extension (04/12/2020)    Time 12    Period Weeks    Status Achieved      PT LONG TERM GOAL #4   Title Pt will be able to ambulate at least 300 ft independently and without LOB to promote mobility.    Baseline Pt currently ambulating with rw (03/03/2020); Pt currently ambulating with SPC (04/12/2020); Pt currently able to ambulate at least 100 ft without AD. distance not tested today secondary to difficulty ambulating secondary to recent acute on chronic low back pain (05/17/2020)    Time 12    Period Weeks    Status Partially  Met    Target Date 05/26/20      PT LONG TERM GOAL #5   Title Pt will improve her R knee FOTO score by at least 10 points as a demonstration of improved function.    Baseline Pt was unable to fill out FOTO at eval (03/03/2020); FOTO not appropriate secondary to memory challenges (04/12/2020)    Time 12    Period Weeks    Status On-going    Target Date 05/26/20                 Plan - 05/17/20 1429    Clinical Impression Statement Pt demonstrates overall improving R knee flexion and extension ROM and currently able to ambulate short distances without AD. Difficulty with gait secondary to recent onset of acute on chronic low back pain. Decreased R knee pain after session but still demonstrates low back symptoms. Pt will benefit from continued skilled physical therapy services to decrease pain, improve strength and function.    Personal Factors and Comorbidities Comorbidity 3+;Age;Fitness;Past/Current Experience    Comorbidities DM, dizziness, HTN, memory    Examination-Activity Limitations Squat;Stairs;Bed Mobility;Bend;Locomotion Level;Stand;Carry;Transfers    Stability/Clinical Decision Making Stable/Uncomplicated    Clinical Decision Making Low    Rehab Potential Fair    Clinical Impairments Affecting Rehab Potential (-) multiple areas of chronic pain, age, memory. (+) motivated    PT Frequency 1x / week    PT Duration 12 weeks  PT Treatment/Interventions Therapeutic activities;Therapeutic exercise;Balance training;Neuromuscular re-education;Patient/family education;Manual techniques;Dry needling;Vestibular;Aquatic Therapy;Canalith Repostioning;Electrical Stimulation;Iontophoresis 15m/ml Dexamethasone;Gait training;Stair training    PT Next Visit Plan ROM, hip and knee strengthening, gait, manual techniques, modalities PRN    PT Home Exercise Plan Medbridge Access Code [email or web address]   Consulted and Agree with Plan of Care Patient           Patient will benefit from skilled  therapeutic intervention in order to improve the following deficits and impairments:  Pain,Improper body mechanics,Postural dysfunction,Difficulty walking,Decreased strength,Decreased scar mobility,Decreased range of motion,Abnormal gait  Visit Diagnosis: Chronic pain of right knee  Difficulty in walking, not elsewhere classified  Muscle weakness (generalized)     Problem List Patient Active Problem List   Diagnosis Date Noted  . Osteoarthritis of right knee 02/12/2020  . S/P total knee arthroplasty, right 02/11/2020  . Obese 06/20/2016  . Status post right knee replacement 06/19/2016  . S/P left TKA 06/18/2016  . Spinal stenosis of lumbar region 09/14/2015   .  Thank you for your referral.    MJoneen BoersPT, DPT   05/17/2020, 4:23 PM  [facility] [address] [place] [address]Phone: [phone]  Fax:  [phone] Name: Destiny BRAMBLETTMRN: [id number] of Birth: [birth year]

## 2020-05-19 ENCOUNTER — Ambulatory Visit: Payer: Medicare Other

## 2020-05-19 ENCOUNTER — Other Ambulatory Visit: Payer: Self-pay

## 2020-05-19 DIAGNOSIS — G8929 Other chronic pain: Secondary | ICD-10-CM

## 2020-05-19 DIAGNOSIS — R262 Difficulty in walking, not elsewhere classified: Secondary | ICD-10-CM

## 2020-05-19 DIAGNOSIS — M6281 Muscle weakness (generalized): Secondary | ICD-10-CM

## 2020-05-19 DIAGNOSIS — M25561 Pain in right knee: Secondary | ICD-10-CM | POA: Diagnosis not present

## 2020-05-19 NOTE — Therapy (Signed)
[place] [facility] [address] [place], [address] Phone: [phone]   Fax:  [phone]  Physical Therapy Treatment  Patient Details  Name: Destiny Shaw MRN: [id number] Date of Birth: 1951-02-26 Referring Provider (PT): Paralee Cancel, MD   Encounter Date: 05/19/2020   PT End of Session - 05/19/20 1418    Visit Number 21    Number of Visits 25    Date for PT Re-Evaluation 05/26/20    Authorization Type 1    Authorization Time Period 10    PT Start Time 1418    PT Stop Time 1457    PT Time Calculation (min) 39 min    Activity Tolerance Patient tolerated treatment well    Behavior During Therapy [facility] for tasks assessed/performed           Past Medical History:  Diagnosis Date  . ADD (attention deficit disorder)   . Anemia   . Anginal pain ([place])    pt states has occas chest pain relates to indigestion; pt uses rest to relieve DR Nelson-   . Anxiety   . Arthritis   . Concussion   . Depression   . Diabetes mellitus without complication ([place])    type 2   . Dizziness   . Fall   . GERD (gastroesophageal reflux disease)   . Headache   . History of urinary tract infection   . Hyperlipidemia   . Hypertension   . IBS (irritable bowel syndrome)   . Imbalance   . Numbness    right leg   . Numbness in both hands    comes and goes   . Pneumonia    last episode approx 1 year ago   . Sleep apnea    uses cpap not all the time   . Wears glasses     Past Surgical History:  Procedure Laterality Date  . BREAST CYST ASPIRATION Left 1980's   neg  . BREAST CYST EXCISION Right 1980's   neg  . BREAST LUMPECTOMY Right   . BREAST SURGERY    . CARPAL TUNNEL RELEASE    . CESAREAN SECTION     times 2  . COLONOSCOPY WITH PROPOFOL N/A 05/11/2015   Procedure: COLONOSCOPY WITH PROPOFOL;  Surgeon: Manya Silvas, MD;  Location: [facility] ENDOSCOPY;  Service: Endoscopy;  Laterality: N/A;  . DE  QUERVAIN'S RELEASE Right   . ESOPHAGOGASTRODUODENOSCOPY (EGD) WITH PROPOFOL N/A 05/11/2015   Procedure: ESOPHAGOGASTRODUODENOSCOPY (EGD) WITH PROPOFOL;  Surgeon: Manya Silvas, MD;  Location: [facility] ENDOSCOPY;  Service: Endoscopy;  Laterality: N/A;  . EYE SURGERY     laser surgery bilat   . HERNIA REPAIR    . KNEE ARTHROSCOPY    . LUMBAR LAMINECTOMY/DECOMPRESSION MICRODISCECTOMY Bilateral 09/14/2015   Procedure: MICRO LUMBAR BILATERAL DECOMPRESSION L4 - L5;  Surgeon: Susa Day, MD;  Location: [facility] ORS;  Service: Orthopedics;  Laterality: Bilateral;  . REDUCTION MAMMAPLASTY Bilateral 1980  . RHINOPLASTY    . TONSILLECTOMY    . TOTAL KNEE ARTHROPLASTY Left 06/18/2016   Procedure: LEFT TOTAL KNEE ARTHROPLASTY;  Surgeon: Paralee Cancel, MD;  Location: [facility] ORS;  Service: Orthopedics;  Laterality: Left;  Adductor Block  . TOTAL KNEE ARTHROPLASTY Right 02/11/2020   Procedure: TOTAL KNEE ARTHROPLASTY;  Surgeon: Paralee Cancel, MD;  Location: [facility] ORS;  Service: Orthopedics;  Laterality: Right;  . TUBAL LIGATION    . UVULOPALATOPHARYNGOPLASTY      There were no vitals  filed for this visit.   Subjective Assessment - 05/19/20 1420    Subjective 6/10 R knee pain currently. Pt son told her that she is doing better with the stairs.    Pertinent History S/P R TKA on 02/11/2020. Pt is currently 3 weeks post op. R knee does not feel great. Has not had PT since leaving the hospital. Has been doing ankle pumps as well as walking with her RW.    Patient Stated Goals Be better able to get into and out of her vehicle (midsized truck), into and out of chairs, be better able to roll in her bed with less L knee pain. Be able to get down on the floor and get back up.     Currently in Pain? Yes    Pain Score 6     Pain Location Knee    Pain Orientation Right                                     PT Education - 05/19/20 1436    Education Details ther-ex    Person(s) Educated Patient     Methods Explanation;Demonstration;Tactile cues;Verbal cues    Comprehension Returned demonstration;Verbalized understanding          Objective   Swelling, scab formation, healing satisfactorily  s/p R TKR12/16/2021    MedbridgeAccess Code [email or web address]  Manual therapy Seated STM to L anterior knee/proximal tibia to decrease fascial restrictions  Seated STM R vastus lateralis to decrease muscle tension     Therapeutic Exercise Seated R knee flexion AAROM with PT 10x3 with 5 second holds   Standing heel toe raise with B UE assist 10x each way  Standing ankle DF/PF 2 min with B UE assist   Forward step up onto first regular step with B UE assist 10x2 R LE   Ascending and descending 4 regular steps with B UE assist 5x, cues for proper weight shifting, and technique.   SLS R LE with L UE assist 10x5 seconds for 2 sets  lateral step up with B UE assist 10x2   Improved exercise technique, movement at target joints, use of target muscles after mod verbal, visual, tactile cues.    Response to treatment Pt tolerated session well without aggravation of symptoms.   Clinical impression Continued working on improving soft tissue mobility R knee to decrease stiffness, improve knee flexion ROM. Also worked on general R LE strengthening to decrease difficulty with gait, transfers and stair negotiation. Pt tolerated session well without aggravation of symptoms. Pt will benefit from continued skilled physical therapy services to improve ROM, strength and function.      PT Short Term Goals - 04/07/20 1440      PT SHORT TERM GOAL #1   Title Patient will be independent with her initial HEP to improve R knee ROM, strength, and decrease difficulty with gait.    Baseline Pt has started her HEP (03/03/2020)    Time 3    Period Weeks    Status Achieved    Target Date 03/24/20             PT Long Term Goals - 05/17/20 1341      PT LONG TERM GOAL #1    Title Patient will improve R knee flexion AROM to 120 degrees to promote ability to ambulate and negotiate stairs with less difficulty.    Baseline 76 degrees setaed R knee  flexion AROM (03/03/2020); 115 degrees flexion 04/07/20; 115 degrees (04/12/2020); 115 decrees R knee flexion (05/17/2020)    Time 12    Period Weeks    Status Partially Met    Target Date 05/26/20      PT LONG TERM GOAL #2   Title Patient will improve R knee extension AROM to -5 degrees or more to promote ability to ambulate as well as perform standing tasks with less difficulty.    Baseline -28 degrees R knee extension AROM (03/03/2020); -15 degrees on 04/07/20; -11 degrees (04/12/2020); -9 R knee extenssion AROM (05/17/2020).    Time 12    Period Weeks    Status On-going    Target Date 05/26/20      PT LONG TERM GOAL #3   Title Pt will improve R knee flexion and extension strength to 5/5 to promote ability to perform standing tasks with less difficulty.    Baseline 4-/5 R knee flexion and 4/5 extension (03/03/2020); 5/5 R knee flexion, 5/5 knee extension (04/12/2020)    Time 12    Period Weeks    Status Achieved      PT LONG TERM GOAL #4   Title Pt will be able to ambulate at least 300 ft independently and without LOB to promote mobility.    Baseline Pt currently ambulating with rw (03/03/2020); Pt currently ambulating with SPC (04/12/2020); Pt currently able to ambulate at least 100 ft without AD. distance not tested today secondary to difficulty ambulating secondary to recent acute on chronic low back pain (05/17/2020)    Time 12    Period Weeks    Status Partially Met    Target Date 05/26/20      PT LONG TERM GOAL #5   Title Pt will improve her R knee FOTO score by at least 10 points as a demonstration of improved function.    Baseline Pt was unable to fill out FOTO at eval (03/03/2020); FOTO not appropriate secondary to memory challenges (04/12/2020)    Time 12    Period Weeks    Status On-going    Target Date 05/26/20                  Plan - 05/19/20 1417    Clinical Impression Statement Continued working on improving soft tissue mobility R knee to decrease stiffness, improve knee flexion ROM. Also worked on general R LE strengthening to decrease difficulty with gait, transfers and stair negotiation. Pt tolerated session well without aggravation of symptoms. Pt will benefit from continued skilled physical therapy services to improve ROM, strength and function.    Personal Factors and Comorbidities Comorbidity 3+;Age;Fitness;Past/Current Experience    Comorbidities DM, dizziness, HTN, memory    Examination-Activity Limitations Squat;Stairs;Bed Mobility;Bend;Locomotion Level;Stand;Carry;Transfers    Stability/Clinical Decision Making Stable/Uncomplicated    Rehab Potential Fair    Clinical Impairments Affecting Rehab Potential (-) multiple areas of chronic pain, age, memory. (+) motivated    PT Frequency 1x / week    PT Duration 12 weeks    PT Treatment/Interventions Therapeutic activities;Therapeutic exercise;Balance training;Neuromuscular re-education;Patient/family education;Manual techniques;Dry needling;Vestibular;Aquatic Therapy;Canalith Repostioning;Electrical Stimulation;Iontophoresis 20m/ml Dexamethasone;Gait training;Stair training    PT Next Visit Plan ROM, hip and knee strengthening, gait, manual techniques, modalities PRN    PT Home Exercise Plan Medbridge Access Code [email or web address]   Consulted and Agree with Plan of Care Patient           Patient will benefit from skilled therapeutic intervention in order to improve the following deficits  and impairments:  Pain,Improper body mechanics,Postural dysfunction,Difficulty walking,Decreased strength,Decreased scar mobility,Decreased range of motion,Abnormal gait  Visit Diagnosis: Chronic pain of right knee  Difficulty in walking, not elsewhere classified  Muscle weakness (generalized)     Problem List Patient Active Problem List   Diagnosis  Date Noted  . Osteoarthritis of right knee 02/12/2020  . S/P total knee arthroplasty, right 02/11/2020  . Obese 06/20/2016  . Status post right knee replacement 06/19/2016  . S/P left TKA 06/18/2016  . Spinal stenosis of lumbar region 09/14/2015    Joneen Boers PT, DPT   05/19/2020, 6:57 PM  [place] [facility] [address] [place], [address] Phone: [phone]   Fax:  [phone]  Name: Destiny Shaw MRN: [id number] Date of Birth: 1950/11/11

## 2020-05-24 ENCOUNTER — Other Ambulatory Visit: Payer: Self-pay

## 2020-05-24 ENCOUNTER — Ambulatory Visit: Payer: Medicare Other

## 2020-05-24 DIAGNOSIS — M6281 Muscle weakness (generalized): Secondary | ICD-10-CM

## 2020-05-24 DIAGNOSIS — G8929 Other chronic pain: Secondary | ICD-10-CM

## 2020-05-24 DIAGNOSIS — R262 Difficulty in walking, not elsewhere classified: Secondary | ICD-10-CM

## 2020-05-24 DIAGNOSIS — M25561 Pain in right knee: Secondary | ICD-10-CM

## 2020-05-24 NOTE — Therapy (Signed)
[facility] [address] [place], [address] Phone: [phone]   Fax:  [phone]  Physical Therapy Treatment  Patient Details  Name: Destiny Shaw MRN: [id number] Date of Birth: [birth year] Referring Provider (PT): Paralee Cancel, MD   Encounter Date: 05/24/2020   PT End of Session - 05/24/20 1334    Visit Number 22    Number of Visits 25    Date for PT Re-Evaluation 05/26/20    Authorization Type 2    Authorization Time Period 10    PT Start Time 1335    PT Stop Time 1415    PT Time Calculation (min) 40 min    Activity Tolerance Patient tolerated treatment well    Behavior During Therapy [facility] for tasks assessed/performed           Past Medical History:  Diagnosis Date  . ADD (attention deficit disorder)   . Anemia   . Anginal pain ([place])    pt states has occas chest pain relates to indigestion; pt uses rest to relieve DR Pierce-   . Anxiety   . Arthritis   . Concussion   . Depression   . Diabetes mellitus without complication ([place])    type 2   . Dizziness   . Fall   . GERD (gastroesophageal reflux disease)   . Headache   . History of urinary tract infection   . Hyperlipidemia   . Hypertension   . IBS (irritable bowel syndrome)   . Imbalance   . Numbness    right leg   . Numbness in both hands    comes and goes   . Pneumonia    last episode approx 1 year ago   . Sleep apnea    uses cpap not all the time   . Wears glasses     Past Surgical History:  Procedure Laterality Date  . BREAST CYST ASPIRATION Left 1980's   neg  . BREAST CYST EXCISION Right 1980's   neg  . BREAST LUMPECTOMY Right   . BREAST SURGERY    . CARPAL TUNNEL RELEASE    . CESAREAN SECTION     times 2  . COLONOSCOPY WITH PROPOFOL N/A 05/11/2015   Procedure: COLONOSCOPY WITH PROPOFOL;  Surgeon: Manya Silvas, MD;  Location: [facility] ENDOSCOPY;  Service: Endoscopy;  Laterality: N/A;  . DE  QUERVAIN'S RELEASE Right   . ESOPHAGOGASTRODUODENOSCOPY (EGD) WITH PROPOFOL N/A 05/11/2015   Procedure: ESOPHAGOGASTRODUODENOSCOPY (EGD) WITH PROPOFOL;  Surgeon: Manya Silvas, MD;  Location: [facility] ENDOSCOPY;  Service: Endoscopy;  Laterality: N/A;  . EYE SURGERY     laser surgery bilat   . HERNIA REPAIR    . KNEE ARTHROSCOPY    . LUMBAR LAMINECTOMY/DECOMPRESSION MICRODISCECTOMY Bilateral 09/14/2015   Procedure: MICRO LUMBAR BILATERAL DECOMPRESSION L4 - L5;  Surgeon: Susa Day, MD;  Location: [facility] ORS;  Service: Orthopedics;  Laterality: Bilateral;  . REDUCTION MAMMAPLASTY Bilateral 1980  . RHINOPLASTY    . TONSILLECTOMY    . TOTAL KNEE ARTHROPLASTY Left 06/18/2016   Procedure: LEFT TOTAL KNEE ARTHROPLASTY;  Surgeon: Paralee Cancel, MD;  Location: [facility] ORS;  Service: Orthopedics;  Laterality: Left;  Adductor Block  . TOTAL KNEE ARTHROPLASTY Right 02/11/2020   Procedure: TOTAL KNEE ARTHROPLASTY;  Surgeon: Paralee Cancel, MD;  Location: [facility] ORS;  Service: Orthopedics;  Laterality: Right;  . TUBAL LIGATION    . UVULOPALATOPHARYNGOPLASTY      There were no vitals  filed for this visit.   Subjective Assessment - 05/24/20 1339    Subjective Pt daughter states that pt loses balance at times at home. Pt daughter states that pt has the side wobble thing. Ground does not feel even.    Pertinent History S/P R TKA on 02/11/2020. Pt is currently 3 weeks post op. R knee does not feel great. Has not had PT since leaving the hospital. Has been doing ankle pumps as well as walking with her RW.    Patient Stated Goals Be better able to get into and out of her vehicle (midsized truck), into and out of chairs, be better able to roll in her bed with less L knee pain. Be able to get down on the floor and get back up.     Currently in Pain? Other (Comment)   No pain level provided.                                    PT Education - 05/24/20 1516    Education Details ther-ex    Person(s)  Educated Patient    Methods Explanation;Demonstration;Tactile cues;Verbal cues    Comprehension Returned demonstration;Verbalized understanding          Objective   Swelling, scab formation, healing satisfactorily  s/p R TKR12/16/2021    MedbridgeAccess Code [email or web address]  Manual therapy  Seated STM B cervical paraspinal muscles and R upper trap muscle to decrease tension to hopefully decrease possible cervicogenic dizziness.     Therapeutic Exercise  Time spent answering pt daughter's Azucena Kuba Henk)/pt caregiver questions for pt, pt present.   Seated hip extension isometrics   R 10x5 seconds for 3 sets   Decreased R lateral hamstrings pain afterwards  Gait around gym, no AD 100 ft. Feels wobbly at times, room does not look like it is spinning but feels like she is spinning.   Gait around gym no AD 100 ft after manual therapy to decrease muscle tension around neck area. Decreased sensation of room spinning. Improved steadiness. Pt was recommended to perform scapular retractions to help decrease upper trap muscle tension. Heating pad was recommended as well to help decrease tension.  Based on pt input with her memory, pt was recommended to ask her PCP about possible treatment options for her memory. Pt and daughter verbalized understanding.    Improved exercise technique, movement at target joints, use of target muscles after mod verbal, visual, tactile cues.    Response to treatment Pt tolerated session well without aggravation of symptoms. Improved sensation of steadiness with gait after manual therapy to decrease muscle tension around her neck.   Clinical impression Possible neck muscle tightness association of sensation of unsteadiness during gait which improved with manual therapy to decrease bilateral cervical paraspinal and upper trap muscle tightness. Decreased R lateral hamstring muscle tightness with treatment to improve R glute max muscle activation.  Pt was recommended to get a referral from her PCP for balance after PT for R knee rehab is done. Pt and daughter verbalized understanding. Pt tolerated session well without aggravation of symptoms. Pt will benefit from continued skilled physical therapy services to decrease pain, improve ROM, strength, balance, and function.         PT Short Term Goals - 04/07/20 1440      PT SHORT TERM GOAL #1   Title Patient will be independent with her initial HEP to improve R knee ROM, strength,  and decrease difficulty with gait.    Baseline Pt has started her HEP (03/03/2020)    Time 3    Period Weeks    Status Achieved    Target Date 03/24/20             PT Long Term Goals - 05/17/20 1341      PT LONG TERM GOAL #1   Title Patient will improve R knee flexion AROM to 120 degrees to promote ability to ambulate and negotiate stairs with less difficulty.    Baseline 76 degrees setaed R knee flexion AROM (03/03/2020); 115 degrees flexion 04/07/20; 115 degrees (04/12/2020); 115 decrees R knee flexion (05/17/2020)    Time 12    Period Weeks    Status Partially Met    Target Date 05/26/20      PT LONG TERM GOAL #2   Title Patient will improve R knee extension AROM to -5 degrees or more to promote ability to ambulate as well as perform standing tasks with less difficulty.    Baseline -28 degrees R knee extension AROM (03/03/2020); -15 degrees on 04/07/20; -11 degrees (04/12/2020); -9 R knee extenssion AROM (05/17/2020).    Time 12    Period Weeks    Status On-going    Target Date 05/26/20      PT LONG TERM GOAL #3   Title Pt will improve R knee flexion and extension strength to 5/5 to promote ability to perform standing tasks with less difficulty.    Baseline 4-/5 R knee flexion and 4/5 extension (03/03/2020); 5/5 R knee flexion, 5/5 knee extension (04/12/2020)    Time 12    Period Weeks    Status Achieved      PT LONG TERM GOAL #4   Title Pt will be able to ambulate at least 300 ft independently and  without LOB to promote mobility.    Baseline Pt currently ambulating with rw (03/03/2020); Pt currently ambulating with SPC (04/12/2020); Pt currently able to ambulate at least 100 ft without AD. distance not tested today secondary to difficulty ambulating secondary to recent acute on chronic low back pain (05/17/2020)    Time 12    Period Weeks    Status Partially Met    Target Date 05/26/20      PT LONG TERM GOAL #5   Title Pt will improve her R knee FOTO score by at least 10 points as a demonstration of improved function.    Baseline Pt was unable to fill out FOTO at eval (03/03/2020); FOTO not appropriate secondary to memory challenges (04/12/2020)    Time 12    Period Weeks    Status On-going    Target Date 05/26/20                 Plan - 05/24/20 1334    Clinical Impression Statement Possible neck muscle tightness association of sensation of unsteadiness during gait which improved with manual therapy to decrease bilateral cervical paraspinal and upper trap muscle tightness. Decreased R lateral hamstring muscle tightness with treatment to improve R glute max muscle activation. Pt was recommended to get a referral from her PCP for balance after PT for R knee rehab is done. Pt and daughter verbalized understanding. Pt tolerated session well without aggravation of symptoms. Pt will benefit from continued skilled physical therapy services to decrease pain, improve ROM, strength, balance, and function.    Personal Factors and Comorbidities Comorbidity 3+;Age;Fitness;Past/Current Experience    Comorbidities DM, dizziness, HTN, memory  Examination-Activity Limitations Squat;Stairs;Bed Mobility;Bend;Locomotion Level;Stand;Carry;Transfers    Stability/Clinical Decision Making Stable/Uncomplicated    Rehab Potential Fair    Clinical Impairments Affecting Rehab Potential (-) multiple areas of chronic pain, age, memory. (+) motivated    PT Frequency 1x / week    PT Duration 12 weeks    PT  Treatment/Interventions Therapeutic activities;Therapeutic exercise;Balance training;Neuromuscular re-education;Patient/family education;Manual techniques;Dry needling;Vestibular;Aquatic Therapy;Canalith Repostioning;Electrical Stimulation;Iontophoresis 7m/ml Dexamethasone;Gait training;Stair training    PT Next Visit Plan ROM, hip and knee strengthening, gait, manual techniques, modalities PRN    PT Home Exercise Plan Medbridge Access Code [email or web address]   Consulted and Agree with Plan of Care Patient           Patient will benefit from skilled therapeutic intervention in order to improve the following deficits and impairments:  Pain,Improper body mechanics,Postural dysfunction,Difficulty walking,Decreased strength,Decreased scar mobility,Decreased range of motion,Abnormal gait  Visit Diagnosis: Chronic pain of right knee  Difficulty in walking, not elsewhere classified  Muscle weakness (generalized)     Problem List Patient Active Problem List   Diagnosis Date Noted  . Osteoarthritis of right knee 02/12/2020  . S/P total knee arthroplasty, right 02/11/2020  . Obese 06/20/2016  . Status post right knee replacement 06/19/2016  . S/P left TKA 06/18/2016  . Spinal stenosis of lumbar region 09/14/2015   MJoneen BoersPT, DPT  05/24/2020, 3:30 PM  [facility] [address] [place] [address]Phone: [phone]  Fax:  [phone] Name: Destiny LOCASCIOMRN: [id number] of Birth: 4December 28, 1952

## 2020-05-26 ENCOUNTER — Other Ambulatory Visit: Payer: Self-pay

## 2020-05-26 ENCOUNTER — Ambulatory Visit: Payer: Medicare Other

## 2020-05-26 DIAGNOSIS — M6281 Muscle weakness (generalized): Secondary | ICD-10-CM

## 2020-05-26 DIAGNOSIS — M25561 Pain in right knee: Secondary | ICD-10-CM | POA: Diagnosis not present

## 2020-05-26 DIAGNOSIS — R262 Difficulty in walking, not elsewhere classified: Secondary | ICD-10-CM

## 2020-05-26 DIAGNOSIS — G8929 Other chronic pain: Secondary | ICD-10-CM

## 2020-05-26 NOTE — Therapy (Signed)
[facility] [address] [place], [address] Phone: [phone]   Fax:  [phone]  Physical Therapy Treatment And Discharge Summary  Patient Details  Name: Destiny Shaw MRN: [id number] Date of Birth: Jun 21, 1950 Referring Provider (PT): Paralee Cancel, MD   Encounter Date: 05/26/2020   PT End of Session - 05/26/20 1333    Visit Number 22    Number of Visits 25    Date for PT Re-Evaluation 05/26/20    Authorization Type 3    Authorization Time Period 10    PT Start Time 1333    PT Stop Time 1415    PT Time Calculation (min) 42 min    Activity Tolerance Patient tolerated treatment well    Behavior During Therapy [facility] for tasks assessed/performed           Past Medical History:  Diagnosis Date  . ADD (attention deficit disorder)   . Anemia   . Anginal pain ([place])    pt states has occas chest pain relates to indigestion; pt uses rest to relieve DR [place]-   . Anxiety   . Arthritis   . Concussion   . Depression   . Diabetes mellitus without complication ([place])    type 2   . Dizziness   . Fall   . GERD (gastroesophageal reflux disease)   . Headache   . History of urinary tract infection   . Hyperlipidemia   . Hypertension   . IBS (irritable bowel syndrome)   . Imbalance   . Numbness    right leg   . Numbness in both hands    comes and goes   . Pneumonia    last episode approx 1 year ago   . Sleep apnea    uses cpap not all the time   . Wears glasses     Past Surgical History:  Procedure Laterality Date  . BREAST CYST ASPIRATION Left 1980's   neg  . BREAST CYST EXCISION Right 1980's   neg  . BREAST LUMPECTOMY Right   . BREAST SURGERY    . CARPAL TUNNEL RELEASE    . CESAREAN SECTION     times 2  . COLONOSCOPY WITH PROPOFOL N/A 05/11/2015   Procedure: COLONOSCOPY WITH PROPOFOL;  Surgeon: Manya Silvas, MD;  Location: [facility] ENDOSCOPY;  Service: Endoscopy;   Laterality: N/A;  . DE QUERVAIN'S RELEASE Right   . ESOPHAGOGASTRODUODENOSCOPY (EGD) WITH PROPOFOL N/A 05/11/2015   Procedure: ESOPHAGOGASTRODUODENOSCOPY (EGD) WITH PROPOFOL;  Surgeon: Manya Silvas, MD;  Location: [facility] ENDOSCOPY;  Service: Endoscopy;  Laterality: N/A;  . EYE SURGERY     laser surgery bilat   . HERNIA REPAIR    . KNEE ARTHROSCOPY    . LUMBAR LAMINECTOMY/DECOMPRESSION MICRODISCECTOMY Bilateral 09/14/2015   Procedure: MICRO LUMBAR BILATERAL DECOMPRESSION L4 - L5;  Surgeon: Susa Day, MD;  Location: [facility] ORS;  Service: Orthopedics;  Laterality: Bilateral;  . REDUCTION MAMMAPLASTY Bilateral 1980  . RHINOPLASTY    . TONSILLECTOMY    . TOTAL KNEE ARTHROPLASTY Left 06/18/2016   Procedure: LEFT TOTAL KNEE ARTHROPLASTY;  Surgeon: Paralee Cancel, MD;  Location: [facility] ORS;  Service: Orthopedics;  Laterality: Left;  Adductor Block  . TOTAL KNEE ARTHROPLASTY Right 02/11/2020   Procedure: TOTAL KNEE ARTHROPLASTY;  Surgeon: Paralee Cancel, MD;  Location: [facility] ORS;  Service: Orthopedics;  Laterality: Right;  . TUBAL LIGATION    . UVULOPALATOPHARYNGOPLASTY      There  were no vitals filed for this visit.   Subjective Assessment - 05/26/20 1334    Subjective R knee is sore on the R side and a little stiff. Trying to rub it out. Has an appointment with Governor Specking tomorrow for balance.    Pertinent History S/P R TKA on 02/11/2020. Pt is currently 3 weeks post op. R knee does not feel great. Has not had PT since leaving the hospital. Has been doing ankle pumps as well as walking with her RW.    Patient Stated Goals Be better able to get into and out of her vehicle (midsized truck), into and out of chairs, be better able to roll in her bed with less L knee pain. Be able to get down on the floor and get back up.     Currently in Pain? Yes    Pain Score 6    R lateral knee                                    PT Education - 05/26/20 1805    Education Details ther-ex,  progress/current status with PT towards goals    Person(s) Educated Patient    Methods Explanation;Demonstration;Tactile cues;Verbal cues    Comprehension Returned demonstration;Verbalized understanding            Objective   Swelling, scab formation, healing satisfactorily  s/p R TKR12/16/2021   MedbridgeAccess Code [email or web address]  Manual therapy Seated STM R lateral knee and scar tissue to decrease fascial restrictions  Seated STM to L anterior knee/proximal tibia to decrease fascial restrictions  Seated STM R vastus lateralis to decrease muscle tension  seated STM B cervical paraspinal and Upper trap muscles to decrease tension secondary to pt reports of it helping her with balance from previous session.    Therapeutic Exercise Seated R knee flexion AAROM with PT 10x3 with 5 second holds   Improved R knee flexion AROM to 125 degrees   Gait x 100 ft. Improved feeling of steadiness after manual therapy to decrease muscle tension around neck as well as improving scapular retraction   Seated B scapular retraction 10x5 seconds for 2 sets  Decreased lateral lean during gait with scapular muscle activation to decrease upper trap muscle tension to neck.    Improved exercise technique, movement at target joints, use of target muscles after mod verbal, visual, tactile cues.    Response to treatment Pt tolerated session well without aggravation of symptoms.   Clinical impression Worked on decreasing neck stiffness to improve balance to decrease fall risk for safety and protect her knee from injury. Improved steadiness with gait with treatment to decrease B upper trap muscle and cervical paraspinal muscle tension and with scapular retraction. . Pt also demonstrates significant improvement in knee flexion and extension AROM as well as improved knee flexion and extension strength and ability to ambulate independently since initial evaluation. Pt has made progress  with PT towards goals for her knee. Skilled physical therapy services discharged with pt continuing her progress with her exercises at home.         PT Short Term Goals - 04/07/20 1440      PT SHORT TERM GOAL #1   Title Patient will be independent with her initial HEP to improve R knee ROM, strength, and decrease difficulty with gait.    Baseline Pt has started her HEP (03/03/2020)    Time 3  Period Weeks    Status Achieved    Target Date 03/24/20             PT Long Term Goals - 05/26/20 1401      PT LONG TERM GOAL #1   Title Patient will improve R knee flexion AROM to 120 degrees to promote ability to ambulate and negotiate stairs with less difficulty.    Baseline 76 degrees setaed R knee flexion AROM (03/03/2020); 115 degrees flexion 04/07/20; 115 degrees (04/12/2020); 115 decrees R knee flexion (05/17/2020); 125 degrees R knee flexion AROM (05/26/2020)    Time 12    Period Weeks    Status Achieved    Target Date 05/26/20      PT LONG TERM GOAL #2   Title Patient will improve R knee extension AROM to -5 degrees or more to promote ability to ambulate as well as perform standing tasks with less difficulty.    Baseline -28 degrees R knee extension AROM (03/03/2020); -15 degrees on 04/07/20; -11 degrees (04/12/2020); -9 R knee extenssion AROM (05/17/2020).  -7 degrees (05/26/2020)    Time 12    Period Weeks    Status Partially Met    Target Date 05/26/20      PT LONG TERM GOAL #3   Title Pt will improve R knee flexion and extension strength to 5/5 to promote ability to perform standing tasks with less difficulty.    Baseline 4-/5 R knee flexion and 4/5 extension (03/03/2020); 5/5 R knee flexion, 5/5 knee extension (04/12/2020)    Time 12    Period Weeks    Status Achieved      PT LONG TERM GOAL #4   Title Pt will be able to ambulate at least 300 ft independently and without LOB to promote mobility.    Baseline Pt currently ambulating with rw (03/03/2020); Pt currently ambulating with  SPC (04/12/2020); Pt currently able to ambulate at least 100 ft without AD. distance not tested today secondary to difficulty ambulating secondary to recent acute on chronic low back pain (05/17/2020); Able to ambulate independently at least 300 ft (05/26/2020)    Time 12    Period Weeks    Status Achieved    Target Date 05/26/20      PT LONG TERM GOAL #5   Title Pt will improve her R knee FOTO score by at least 10 points as a demonstration of improved function.    Baseline Pt was unable to fill out FOTO at eval (03/03/2020); FOTO not appropriate secondary to memory challenges (04/12/2020)    Time 12    Period Weeks    Status Unable to assess    Target Date 05/26/20                 Plan - 05/26/20 1349    Clinical Impression Statement Worked on decreasing neck stiffness to improve balance to decrease fall risk for safety and protect her knee from injury. Improved steadiness with gait with treatment to decrease B upper trap muscle and cervical paraspinal muscle tension and with scapular retraction. . Pt also demonstrates significant improvement in knee flexion and extension AROM as well as improved knee flexion and extension strength and ability to ambulate independently since initial evaluation. Pt has made progress with PT towards goals for her knee. Skilled physical therapy services discharged with pt continuing her progress with her exercises at home.    Personal Factors and Comorbidities Comorbidity 3+;Age;Fitness;Past/Current Experience    Comorbidities DM, dizziness, HTN, memory  Examination-Activity Limitations Squat;Stairs;Bed Mobility;Bend;Locomotion Level;Stand;Carry;Transfers    Stability/Clinical Decision Making Stable/Uncomplicated    Clinical Decision Making Low    Rehab Potential Fair    Clinical Impairments Affecting Rehab Potential (-) multiple areas of chronic pain, age, memory. (+) motivated    PT Frequency 1x / week    PT Duration 12 weeks    PT  Treatment/Interventions Therapeutic activities;Therapeutic exercise;Balance training;Neuromuscular re-education;Patient/family education;Manual techniques;Dry needling;Vestibular;Aquatic Therapy;Canalith Repostioning;Electrical Stimulation;Iontophoresis 29m/ml Dexamethasone;Gait training;Stair training    PT Next Visit Plan ROM, hip and knee strengthening, gait, manual techniques, modalities PRN    PT Home Exercise Plan Medbridge Access Code [email or web address]   Consulted and Agree with Plan of Care Patient           Patient will benefit from skilled therapeutic intervention in order to improve the following deficits and impairments:  Pain,Improper body mechanics,Postural dysfunction,Difficulty walking,Decreased strength,Decreased scar mobility,Decreased range of motion,Abnormal gait  Visit Diagnosis: Chronic pain of right knee  Difficulty in walking, not elsewhere classified  Muscle weakness (generalized)     Problem List Patient Active Problem List   Diagnosis Date Noted  . Osteoarthritis of right knee 02/12/2020  . S/P total knee arthroplasty, right 02/11/2020  . Obese 06/20/2016  . Status post right knee replacement 06/19/2016  . S/P left TKA 06/18/2016  . Spinal stenosis of lumbar region 09/14/2015     Thank you for your referral.  MJoneen BoersPT, DPT   05/26/2020, 6:14 PM  [facility] [address] [place] [address]Phone: [phone]  Fax:  [phone] Name: Destiny BERTONMRN: [id number] of Birth: 4February 28, 1952

## 2020-05-31 ENCOUNTER — Ambulatory Visit: Payer: Medicare Other

## 2020-06-07 ENCOUNTER — Other Ambulatory Visit: Payer: Self-pay

## 2020-06-07 ENCOUNTER — Ambulatory Visit: Payer: Medicare Other | Attending: Orthopedic Surgery

## 2020-06-07 DIAGNOSIS — R2681 Unsteadiness on feet: Secondary | ICD-10-CM | POA: Insufficient documentation

## 2020-06-07 DIAGNOSIS — R42 Dizziness and giddiness: Secondary | ICD-10-CM | POA: Insufficient documentation

## 2020-06-07 NOTE — Therapy (Signed)
[facility] [address], [place], [address] Phone: [phone]   Fax:  [phone]  Physical Therapy Evaluation  Patient Details  Name: Destiny Shaw MRN: [id number] Date of Birth: 02/16/1951 Referring Provider (PT): Governor Specking, MD   Encounter Date: 06/07/2020   PT End of Session - 06/07/20 1506    Visit Number 1    Number of Visits 17    Date for PT Re-Evaluation 08/04/20    Authorization Type 1    Authorization Time Period of 10 Progress report    PT Start Time 1507    PT Stop Time 1546    PT Time Calculation (min) 39 min    Equipment Utilized During Treatment Gait belt    Activity Tolerance Patient tolerated treatment well    Behavior During Therapy WFL for tasks assessed/performed           Past Medical History:  Diagnosis Date  . ADD (attention deficit disorder)   . Anemia   . Anginal pain ([place])    pt states has occas chest pain relates to indigestion; pt uses rest to relieve DR [place]-   . Anxiety   . Arthritis   . Concussion   . Depression   . Diabetes mellitus without complication ([place])    type 2   . Dizziness   . Fall   . GERD (gastroesophageal reflux disease)   . Headache   . History of urinary tract infection   . Hyperlipidemia   . Hypertension   . IBS (irritable bowel syndrome)   . Imbalance   . Numbness    right leg   . Numbness in both hands    comes and goes   . Pneumonia    last episode approx 1 year ago   . Sleep apnea    uses cpap not all the time   . Wears glasses     Past Surgical History:  Procedure Laterality Date  . BREAST CYST ASPIRATION Left 1980's   neg  . BREAST CYST EXCISION Right 1980's   neg  . BREAST LUMPECTOMY Right   . BREAST SURGERY    . CARPAL TUNNEL RELEASE    . CESAREAN SECTION     times 2  . COLONOSCOPY WITH PROPOFOL N/A 05/11/2015   Procedure: COLONOSCOPY WITH PROPOFOL;  Surgeon: Manya Silvas, MD;   Location: [facility];  Service: Endoscopy;  Laterality: N/A;  . DE QUERVAIN'S RELEASE Right   . ESOPHAGOGASTRODUODENOSCOPY (EGD) WITH PROPOFOL N/A 05/11/2015   Procedure: ESOPHAGOGASTRODUODENOSCOPY (EGD) WITH PROPOFOL;  Surgeon: Manya Silvas, MD;  Location: [facility] ENDOSCOPY;  Service: Endoscopy;  Laterality: N/A;  . EYE SURGERY     laser surgery bilat   . HERNIA REPAIR    . KNEE ARTHROSCOPY    . LUMBAR LAMINECTOMY/DECOMPRESSION MICRODISCECTOMY Bilateral 09/14/2015   Procedure: MICRO LUMBAR BILATERAL DECOMPRESSION L4 - L5;  Surgeon: Susa Day, MD;  Location: [facility] ORS;  Service: Orthopedics;  Laterality: Bilateral;  . REDUCTION MAMMAPLASTY Bilateral 1980  . RHINOPLASTY    . TONSILLECTOMY    . TOTAL KNEE ARTHROPLASTY Left 06/18/2016   Procedure: LEFT TOTAL KNEE ARTHROPLASTY;  Surgeon: Paralee Cancel, MD;  Location: [facility] ORS;  Service: Orthopedics;  Laterality: Left;  Adductor Block  . TOTAL KNEE ARTHROPLASTY Right 02/11/2020   Procedure: TOTAL KNEE ARTHROPLASTY;  Surgeon: Paralee Cancel, MD;  Location: [facility] ORS;  Service: Orthopedics;  Laterality: Right;  . TUBAL LIGATION    .  UVULOPALATOPHARYNGOPLASTY      There were no vitals filed for this visit.    Subjective Assessment - 06/07/20 1515    Subjective Feels unsteady when walking.    Pertinent History Ataxia.Pt states her problem is when she turns. Fine when she is straight ahead. Turning makes everything out of focus. Can see things better when walking straight. Bending over and getting back up also causes dizziness. Does not remember any falls for the past 6 months. Has some sways. Stays close to things she can grab for safety.    Patient Stated Goals Pt expresses desire to be more steady when she walks.    Aggravating Factors  Bending over and getting up, turning to the R    Pain Relieving Factors turning to the L, rest              [facility] PT Assessment - 06/07/20 1504      Assessment   Medical Diagnosis Ataxia    Referring  Provider (PT) Governor Specking, MD    Onset Date/Surgical Date 06/02/20   Date PT referral signed.     Precautions   Precaution Comments Fall risk      Special Tests   Other special tests TUG 19 seconds average.  Dix-Hallpike: (+) R and L with greater symptoms for R side.      Ambulation/Gait   Gait Comments unsteady, sways side to side at times, especially after turning to the right.                      Objective measurements completed on examination: See above findings.    Medbridge Access Code [email or web address]   TUG  18 seconds, 23 (pt turned to the R and had to pause due to unsteadiness/feeling wobbly), 18 seconds (pt turned to the L and vision was not out of focus), 17 seconds (less out of focus secondary to pt turning to the L)    Pt states her problem is when she turns. Fine when she is straight ahead. Turning makes everything out of focus. Can see things better when straight.   Forward trunk flexion then returning to neutral: reproduced out of focus vision and unsteady feeling  Turning to the R 360 degrees: reproduced out of focus vision and unsteady feeling  Turning the L 360 degrees: decreased symptoms, more steady.     Pt states looking up also causes her to feel unsteady.     Dix Hallpike   L (+)   R (+) with greater reproduction of symptoms.   Canalith repositioning   R side. 10 minutes for symptoms to ease off during first position.   Performed 1x  Still has some unsteadiness afterwards. Pt however states feeling more steady when walking forward    Response to treatment Pt states feeling a little more steady with forward ambulation after treatment.  Clinical impression Pt is a 70 year old female who came to physical therapy secondary to unsteady gait. She also presents with reproduction of symptoms with turning to the right, bending over and returning to neutral, looking up, as well as with the [facility] test R > L suggesting inner ear  involvement. Pt demonstrates difficulty with gait and balance and will benefit from skilled physical therapy services to address the aforementioned deficits.                          PT Education - 06/07/20 1601  Education Details Canalith repositioning, plan of care    Person(s) Educated Patient    Methods Explanation;Demonstration;Tactile cues;Verbal cues    Comprehension Returned demonstration;Verbalized understanding            PT Short Term Goals - 06/07/20 1612      PT SHORT TERM GOAL #1   Title Pt will be independent with her initial HEP to improve balance and steadiness with gait.    Time 3    Period Weeks    Status New    Target Date 06/30/20             PT Long Term Goals - 06/07/20 1612      PT LONG TERM GOAL #1   Title Patient will improve TUG time to 12 seconds or less as a demonstration of improved functional mobility.    Baseline TUG without AD 19 seconds on average (06/07/2020)    Time 8    Period Weeks    Status New    Target Date 08/04/20      PT LONG TERM GOAL #2   Title Pt will be able to turn to the R and to the L 360 degrees without reports of dizziness or unsteadiness at least 5 times each way to promote ability to turn more safely.    Baseline Turning increases unsteadiness (06/07/2020)    Time 8    Period Weeks    Status New    Target Date 08/04/20                  Plan - 06/07/20 1602    Clinical Impression Statement Pt is a 70 year old female who came to physical therapy secondary to unsteady gait. She also presents with reproduction of symptoms with turning to the right, bending over and returning to neutral, looking up, as well as with the [facility] test R > L suggesting inner ear involvement. Pt demonstrates difficulty with gait and balance and will benefit from skilled physical therapy services to address the aforementioned deficits.    Personal Factors and Comorbidities Comorbidity  3+;Age;Fitness;Past/Current Experience;Time since onset of injury/illness/exacerbation    Comorbidities ADD, HTN, depression, anxiety, hx of concussion    Examination-Activity Limitations Bed Mobility;Bend    Stability/Clinical Decision Making Evolving/Moderate complexity    Clinical Decision Making Moderate    Clinical Presentation due to: unsteadiness seems to be increasing based on subjective reports    Rehab Potential Fair    Clinical Impairments Affecting Rehab Potential memory, chronicity of condition, age, hx of concussion    PT Frequency 2x / week    PT Duration 8 weeks    PT Treatment/Interventions Therapeutic activities;Therapeutic exercise;Balance training;Neuromuscular re-education;Patient/family education;Manual techniques;Dry needling;Vestibular;Canalith Repostioning;Electrical Stimulation;Gait training;Stair training;Functional mobility training    PT Next Visit Plan canalith repositinoing, strengthening, manual techniques, modalities PRN    Consulted and Agree with Plan of Care Patient           Patient will benefit from skilled therapeutic intervention in order to improve the following deficits and impairments:  Abnormal gait,Decreased balance,Decreased strength,Difficulty walking,Dizziness,Improper body mechanics,Postural dysfunction,Pain  Visit Diagnosis: Unsteady gait - Plan: PT plan of care cert/re-cert  Dizziness and giddiness - Plan: PT plan of care cert/re-cert     Problem List Patient Active Problem List   Diagnosis Date Noted  . Osteoarthritis of right knee 02/12/2020  . S/P total knee arthroplasty, right 02/11/2020  . Obese 06/20/2016  . Status post right knee replacement 06/19/2016  . S/P left TKA  06/18/2016  . Spinal stenosis of lumbar region 09/14/2015    Joneen Boers PT, DPT   06/07/2020, 4:22 PM  [facility] [address], [place], [address] Phone: [phone]   Fax:   [phone]  Name: JAKARI JACOT MRN: [id number] Date of Birth: 11/05/1950

## 2020-06-09 ENCOUNTER — Other Ambulatory Visit: Payer: Self-pay

## 2020-06-09 ENCOUNTER — Ambulatory Visit: Payer: Medicare Other

## 2020-06-09 DIAGNOSIS — R42 Dizziness and giddiness: Secondary | ICD-10-CM

## 2020-06-09 DIAGNOSIS — R2681 Unsteadiness on feet: Secondary | ICD-10-CM

## 2020-06-09 NOTE — Therapy (Signed)
[facility] [address], [place], [address] Phone: [phone]   Fax:  [phone]  Physical Therapy Treatment  Patient Details  Name: Destiny Shaw MRN: [id number] Date of Birth: 1950-12-23 Referring Provider (PT): Governor Specking, MD   Encounter Date: 06/09/2020   PT End of Session - 06/09/20 1420    Visit Number 2    Number of Visits 17    Date for PT Re-Evaluation 08/04/20    Authorization Type 2    Authorization Time Period of 10 Progress report    PT Start Time 6734    PT Stop Time 1502    PT Time Calculation (min) 42 min    Equipment Utilized During Treatment Gait belt    Activity Tolerance Patient tolerated treatment well    Behavior During Therapy WFL for tasks assessed/performed           Past Medical History:  Diagnosis Date  . ADD (attention deficit disorder)   . Anemia   . Anginal pain ([place])    pt states has occas chest pain relates to indigestion; pt uses rest to relieve DR [place]-   . Anxiety   . Arthritis   . Concussion   . Depression   . Diabetes mellitus without complication ([place])    type 2   . Dizziness   . Fall   . GERD (gastroesophageal reflux disease)   . Headache   . History of urinary tract infection   . Hyperlipidemia   . Hypertension   . IBS (irritable bowel syndrome)   . Imbalance   . Numbness    right leg   . Numbness in both hands    comes and goes   . Pneumonia    last episode approx 1 year ago   . Sleep apnea    uses cpap not all the time   . Wears glasses     Past Surgical History:  Procedure Laterality Date  . BREAST CYST ASPIRATION Left 1980's   neg  . BREAST CYST EXCISION Right 1980's   neg  . BREAST LUMPECTOMY Right   . BREAST SURGERY    . CARPAL TUNNEL RELEASE    . CESAREAN SECTION     times 2  . COLONOSCOPY WITH PROPOFOL N/A 05/11/2015   Procedure: COLONOSCOPY WITH PROPOFOL;  Surgeon: Manya Silvas, MD;   Location: [facility] ENDOSCOPY;  Service: Endoscopy;  Laterality: N/A;  . DE QUERVAIN'S RELEASE Right   . ESOPHAGOGASTRODUODENOSCOPY (EGD) WITH PROPOFOL N/A 05/11/2015   Procedure: ESOPHAGOGASTRODUODENOSCOPY (EGD) WITH PROPOFOL;  Surgeon: Manya Silvas, MD;  Location: [facility] ENDOSCOPY;  Service: Endoscopy;  Laterality: N/A;  . EYE SURGERY     laser surgery bilat   . HERNIA REPAIR    . KNEE ARTHROSCOPY    . LUMBAR LAMINECTOMY/DECOMPRESSION MICRODISCECTOMY Bilateral 09/14/2015   Procedure: MICRO LUMBAR BILATERAL DECOMPRESSION L4 - L5;  Surgeon: Susa Day, MD;  Location: [facility];  Service: Orthopedics;  Laterality: Bilateral;  . REDUCTION MAMMAPLASTY Bilateral 1980  . RHINOPLASTY    . TONSILLECTOMY    . TOTAL KNEE ARTHROPLASTY Left 06/18/2016   Procedure: LEFT TOTAL KNEE ARTHROPLASTY;  Surgeon: Paralee Cancel, MD;  Location: [facility];  Service: Orthopedics;  Laterality: Left;  Adductor Block  . TOTAL KNEE ARTHROPLASTY Right 02/11/2020   Procedure: TOTAL KNEE ARTHROPLASTY;  Surgeon: Paralee Cancel, MD;  Location: [facility]S;  Service: Orthopedics;  Laterality: Right;  . TUBAL LIGATION    .  UVULOPALATOPHARYNGOPLASTY      There were no vitals filed for this visit.   Subjective Assessment - 06/09/20 1421    Subjective Thinks there was some improvement after last session. Has felt better after the past couple of days. Also had some medications changes which caused her to throw up. The dizziness has not improved.    Pertinent History Ataxia.Pt states her problem is when she turns. Fine when she is straight ahead. Turning makes everything out of focus. Can see things better when walking straight. Bending over and getting back up also causes dizziness. Does not remember any falls for the past 6 months. Has some sways. Stays close to things she can grab for safety.    Patient Stated Goals Pt expresses desire to be more steady when she walks.    Currently in Pain? Other (Comment)   No pain mentioned.                                     PT Education - 06/09/20 1458    Education Details ther-ex    Person(s) Educated Patient    Methods Explanation;Demonstration;Tactile cues;Verbal cues    Comprehension Returned demonstration;Verbalized understanding            Objective   Medbridge Access Code [email or web address]   Canalith repositioning              R side.              Performed 1x  Slight improved steadiness reported with gait.                Manual therapy Seated STM B cervical paraspinal and upper trap muscles L > R  Pt states feeling more steady with gait afterwards   Therapeutic exercise Seated manually resisted scapular retraction targeting lower trap muscles    L 10x5 seconds for 3 sets    Gait throughout session to assess effectiveness of treatment   Improved exercise technique, movement at target joints, use of target muscles after mod verbal, visual, tactile cues.    Response to treatment Pt states feeling a little more steady with forward ambulation after treatment.  Clinical impression  Possible decrease in dizziness with epley and treatment to decrease muscle tension around neck. Pt reports feeling a little more steady with gait after session. Pt will benefit from continued skilled physical therapy services to improve balance and function.          PT Short Term Goals - 06/07/20 1612      PT SHORT TERM GOAL #1   Title Pt will be independent with her initial HEP to improve balance and steadiness with gait.    Time 3    Period Weeks    Status New    Target Date 06/30/20             PT Long Term Goals - 06/07/20 1612      PT LONG TERM GOAL #1   Title Patient will improve TUG time to 12 seconds or less as a demonstration of improved functional mobility.    Baseline TUG without AD 19 seconds on average (06/07/2020)    Time 8    Period Weeks    Status New    Target Date 08/04/20      PT LONG TERM GOAL #2   Title Pt will  be able to turn to the R and to the  L 360 degrees without reports of dizziness or unsteadiness at least 5 times each way to promote ability to turn more safely.    Baseline Turning increases unsteadiness (06/07/2020)    Time 8    Period Weeks    Status New    Target Date 08/04/20                 Plan - 06/09/20 1500    Clinical Impression Statement Possible decrease in dizziness with epley and treatment to decrease muscle tension around neck. Pt reports feeling a little more steady with gait after session. Pt will benefit from continued skilled physical therapy services to improve balance and function.    Personal Factors and Comorbidities Comorbidity 3+;Age;Fitness;Past/Current Experience;Time since onset of injury/illness/exacerbation    Comorbidities ADD, HTN, depression, anxiety, hx of concussion    Examination-Activity Limitations Bed Mobility;Bend    Stability/Clinical Decision Making Evolving/Moderate complexity    Rehab Potential Fair    Clinical Impairments Affecting Rehab Potential memory, chronicity of condition, age, hx of concussion    PT Frequency 2x / week    PT Duration 8 weeks    PT Treatment/Interventions Therapeutic activities;Therapeutic exercise;Balance training;Neuromuscular re-education;Patient/family education;Manual techniques;Dry needling;Vestibular;Canalith Repostioning;Electrical Stimulation;Gait training;Stair training;Functional mobility training    PT Next Visit Plan canalith repositinoing, strengthening, manual techniques, modalities PRN    Consulted and Agree with Plan of Care Patient           Patient will benefit from skilled therapeutic intervention in order to improve the following deficits and impairments:  Abnormal gait,Decreased balance,Decreased strength,Difficulty walking,Dizziness,Improper body mechanics,Postural dysfunction,Pain  Visit Diagnosis: Unsteady gait  Dizziness and giddiness     Problem List Patient Active Problem List    Diagnosis Date Noted  . Osteoarthritis of right knee 02/12/2020  . S/P total knee arthroplasty, right 02/11/2020  . Obese 06/20/2016  . Status post right knee replacement 06/19/2016  . S/P left TKA 06/18/2016  . Spinal stenosis of lumbar region 09/14/2015    Joneen Boers PT, DPT   06/09/2020, 4:57 PM  [facility] [address] [place], [address] Phone: [phone]   Fax:  [phone]  Name: AIMEE HELDMAN MRN: [id number] Date of Birth: 14-Feb-1951

## 2020-06-13 ENCOUNTER — Ambulatory Visit: Payer: Medicare Other

## 2020-06-13 ENCOUNTER — Other Ambulatory Visit: Payer: Self-pay

## 2020-06-13 DIAGNOSIS — R42 Dizziness and giddiness: Secondary | ICD-10-CM

## 2020-06-13 DIAGNOSIS — R2681 Unsteadiness on feet: Secondary | ICD-10-CM | POA: Diagnosis not present

## 2020-06-13 NOTE — Therapy (Signed)
[facility] [address], [place], [address] Phone: [phone]   Fax:  [phone]  Physical Therapy Treatment  Patient Details  Name: Destiny Shaw MRN: [id number] Date of Birth: December 18, 1950 Referring Provider (PT): Governor Specking, MD   Encounter Date: 06/13/2020   PT End of Session - 06/13/20 1544    Visit Number 3    Number of Visits 17    Date for PT Re-Evaluation 08/04/20    Authorization Type 3    Authorization Time Period of 10 Progress report    PT Start Time 1544    PT Stop Time 1628    PT Time Calculation (min) 44 min    Equipment Utilized During Treatment --    Activity Tolerance Patient tolerated treatment well    Behavior During Therapy [facility] for tasks assessed/performed           Past Medical History:  Diagnosis Date  . ADD (attention deficit disorder)   . Anemia   . Anginal pain ([place])    pt states has occas chest pain relates to indigestion; pt uses rest to relieve DR [place]-   . Anxiety   . Arthritis   . Concussion   . Depression   . Diabetes mellitus without complication ([place])    type 2   . Dizziness   . Fall   . GERD (gastroesophageal reflux disease)   . Headache   . History of urinary tract infection   . Hyperlipidemia   . Hypertension   . IBS (irritable bowel syndrome)   . Imbalance   . Numbness    right leg   . Numbness in both hands    comes and goes   . Pneumonia    last episode approx 1 year ago   . Sleep apnea    uses cpap not all the time   . Wears glasses     Past Surgical History:  Procedure Laterality Date  . BREAST CYST ASPIRATION Left 1980's   neg  . BREAST CYST EXCISION Right 1980's   neg  . BREAST LUMPECTOMY Right   . BREAST SURGERY    . CARPAL TUNNEL RELEASE    . CESAREAN SECTION     times 2  . COLONOSCOPY WITH PROPOFOL N/A 05/11/2015   Procedure: COLONOSCOPY WITH PROPOFOL;  Surgeon: Manya Silvas, MD;  Location:  [facility] ENDOSCOPY;  Service: Endoscopy;  Laterality: N/A;  . DE QUERVAIN'S RELEASE Right   . ESOPHAGOGASTRODUODENOSCOPY (EGD) WITH PROPOFOL N/A 05/11/2015   Procedure: ESOPHAGOGASTRODUODENOSCOPY (EGD) WITH PROPOFOL;  Surgeon: Manya Silvas, MD;  Location: [facility] ENDOSCOPY;  Service: Endoscopy;  Laterality: N/A;  . EYE SURGERY     laser surgery bilat   . HERNIA REPAIR    . KNEE ARTHROSCOPY    . LUMBAR LAMINECTOMY/DECOMPRESSION MICRODISCECTOMY Bilateral 09/14/2015   Procedure: MICRO LUMBAR BILATERAL DECOMPRESSION L4 - L5;  Surgeon: Susa Day, MD;  Location: [facility] ORS;  Service: Orthopedics;  Laterality: Bilateral;  . REDUCTION MAMMAPLASTY Bilateral 1980  . RHINOPLASTY    . TONSILLECTOMY    . TOTAL KNEE ARTHROPLASTY Left 06/18/2016   Procedure: LEFT TOTAL KNEE ARTHROPLASTY;  Surgeon: Paralee Cancel, MD;  Location: [facility] ORS;  Service: Orthopedics;  Laterality: Left;  Adductor Block  . TOTAL KNEE ARTHROPLASTY Right 02/11/2020   Procedure: TOTAL KNEE ARTHROPLASTY;  Surgeon: Paralee Cancel, MD;  Location: [facility] ORS;  Service: Orthopedics;  Laterality: Right;  . TUBAL LIGATION    .  UVULOPALATOPHARYNGOPLASTY      There were no vitals filed for this visit.   Subjective Assessment - 06/13/20 1547    Subjective Balance has been really rough the last few days. Has a hard time walking. Unsure if the room looks like it is moving. Was able to sleep better after the manual therapy to her neck last session. Was good after last session. Symptoms bothered her when she woke up from sleep to go to the bathroom as well as when getting up in the morning from bed.    Pertinent History Ataxia.Pt states her problem is when she turns. Fine when she is straight ahead. Turning makes everything out of focus. Can see things better when walking straight. Bending over and getting back up also causes dizziness. Does not remember any falls for the past 6 months. Has some sways. Stays close to things she can grab for safety.    Patient  Stated Goals Pt expresses desire to be more steady when she walks.    Currently in Pain? Other (Comment)   no pain reported.                                    PT Education - 06/13/20 1605    Education Details ther-ex    Person(s) Educated Patient    Methods Explanation;Demonstration;Tactile cues;Verbal cues    Comprehension Returned demonstration;Verbalized understanding          Objective   MedbridgeAccess Code [email or web address]   Canalith repositioning  R side.  Performed 1x             Slight improved steadiness reported with gait.    Manual therapy Supine STM B cervical paraspinal muscles R > L  To decrease tension (R cervical paraspinal muscles more tense)   Seated STM B upper trap muscles    Therapeutic exercise Supine cervical nodding 1 minute x 3 Supine B scapular retraction 10x5 seconds  Supine chin tucks 10x5 seconds for 3 sets   Seated manually resisted scapular retraction targeting lower trap muscles                          L 10x5 seconds for 3 sets   R 10x5 seconds for 3 sets              Gait throughout session to assess effectiveness of treatment   Improved exercise technique, movement at target joints, use of target muscles after mod verbal, visual, tactile cues.    Response to treatment Pt states feeling a little more steady with gait after treatment   Clinical impression Worked on decreasing muscle tension around posterior neck as well as B upper trap muscles secondary possible cervicogenic dizziness based on pt not really being certain whether the room looked like it is moving. Pt seemed like she felt like the room was moving when her symptoms occurred. Decreased unsteadiness during gait reported by pt after session. Pt will benefit from continued skilled physical therapy services to improve balance and decrease fall risk.       PT Short Term Goals - 06/07/20 1612      PT  SHORT TERM GOAL #1   Title Pt will be independent with her initial HEP to improve balance and steadiness with gait.    Time 3    Period Weeks    Status New    Target Date 06/30/20  PT Long Term Goals - 06/07/20 1612      PT LONG TERM GOAL #1   Title Patient will improve TUG time to 12 seconds or less as a demonstration of improved functional mobility.    Baseline TUG without AD 19 seconds on average (06/07/2020)    Time 8    Period Weeks    Status New    Target Date 08/04/20      PT LONG TERM GOAL #2   Title Pt will be able to turn to the R and to the L 360 degrees without reports of dizziness or unsteadiness at least 5 times each way to promote ability to turn more safely.    Baseline Turning increases unsteadiness (06/07/2020)    Time 8    Period Weeks    Status New    Target Date 08/04/20                 Plan - 06/13/20 1544    Clinical Impression Statement Worked on decreasing muscle tension around posterior neck as well as B upper trap muscles secondary possible cervicogenic dizziness based on pt not really being certain whether the room looked like it is moving. Pt seemed like she felt like the room was moving when her symptoms occurred. Decreased unsteadiness during gait reported by pt after session. Pt will benefit from continued skilled physical therapy services to improve balance and decrease fall risk.    Personal Factors and Comorbidities Comorbidity 3+;Age;Fitness;Past/Current Experience;Time since onset of injury/illness/exacerbation    Comorbidities ADD, HTN, depression, anxiety, hx of concussion    Examination-Activity Limitations Bed Mobility;Bend    Stability/Clinical Decision Making Evolving/Moderate complexity    Rehab Potential Fair    Clinical Impairments Affecting Rehab Potential memory, chronicity of condition, age, hx of concussion    PT Frequency 2x / week    PT Duration 8 weeks    PT Treatment/Interventions Therapeutic  activities;Therapeutic exercise;Balance training;Neuromuscular re-education;Patient/family education;Manual techniques;Dry needling;Vestibular;Canalith Repostioning;Electrical Stimulation;Gait training;Stair training;Functional mobility training    PT Next Visit Plan canalith repositinoing, strengthening, manual techniques, modalities PRN    Consulted and Agree with Plan of Care Patient           Patient will benefit from skilled therapeutic intervention in order to improve the following deficits and impairments:  Abnormal gait,Decreased balance,Decreased strength,Difficulty walking,Dizziness,Improper body mechanics,Postural dysfunction,Pain  Visit Diagnosis: Unsteady gait  Dizziness and giddiness     Problem List Patient Active Problem List   Diagnosis Date Noted  . Osteoarthritis of right knee 02/12/2020  . S/P total knee arthroplasty, right 02/11/2020  . Obese 06/20/2016  . Status post right knee replacement 06/19/2016  . S/P left TKA 06/18/2016  . Spinal stenosis of lumbar region 09/14/2015    Joneen Boers PT, DPT   06/13/2020, 6:17 PM  [facility] [address], [place], [address] Phone: [phone]   Fax:  [phone]  Name: FELECITY LEMASTER MRN: [id number] Date of Birth: 1951-01-03

## 2020-06-15 ENCOUNTER — Ambulatory Visit: Payer: Medicare Other

## 2020-06-20 ENCOUNTER — Ambulatory Visit: Payer: Medicare Other

## 2020-06-20 ENCOUNTER — Other Ambulatory Visit: Payer: Self-pay

## 2020-06-20 DIAGNOSIS — R2681 Unsteadiness on feet: Secondary | ICD-10-CM | POA: Diagnosis not present

## 2020-06-20 DIAGNOSIS — R42 Dizziness and giddiness: Secondary | ICD-10-CM

## 2020-06-20 NOTE — Therapy (Signed)
[place] [address] [place], [address] Phone: [phone]   Fax:  [phone]  Physical Therapy Treatment  Patient Details  Name: Destiny Shaw MRN: [id number] Date of Birth: 1950-12-27 Referring Provider (PT): Governor Specking, MD   Encounter Date: 06/20/2020   PT End of Session - 06/20/20 1546    Visit Number 4    Number of Visits 17    Date for PT Re-Evaluation 08/04/20    Authorization Type 4    Authorization Time Period of 10 Progress report    PT Start Time 2536    PT Stop Time 1624    PT Time Calculation (min) 37 min    Activity Tolerance Patient tolerated treatment well    Behavior During Therapy [facility] for tasks assessed/performed           Past Medical History:  Diagnosis Date  . ADD (attention deficit disorder)   . Anemia   . Anginal pain ([place])    pt states has occas chest pain relates to indigestion; pt uses rest to relieve DR [place]-   . Anxiety   . Arthritis   . Concussion   . Depression   . Diabetes mellitus without complication ([place])    type 2   . Dizziness   . Fall   . GERD (gastroesophageal reflux disease)   . Headache   . History of urinary tract infection   . Hyperlipidemia   . Hypertension   . IBS (irritable bowel syndrome)   . Imbalance   . Numbness    right leg   . Numbness in both hands    comes and goes   . Pneumonia    last episode approx 1 year ago   . Sleep apnea    uses cpap not all the time   . Wears glasses     Past Surgical History:  Procedure Laterality Date  . BREAST CYST ASPIRATION Left 1980's   neg  . BREAST CYST EXCISION Right 1980's   neg  . BREAST LUMPECTOMY Right   . BREAST SURGERY    . CARPAL TUNNEL RELEASE    . CESAREAN SECTION     times 2  . COLONOSCOPY WITH PROPOFOL N/A 05/11/2015   Procedure: COLONOSCOPY WITH PROPOFOL;  Surgeon: Manya Silvas, MD;  Location: [facility] ENDOSCOPY;  Service: Endoscopy;   Laterality: N/A;  . DE QUERVAIN'S RELEASE Right   . ESOPHAGOGASTRODUODENOSCOPY (EGD) WITH PROPOFOL N/A 05/11/2015   Procedure: ESOPHAGOGASTRODUODENOSCOPY (EGD) WITH PROPOFOL;  Surgeon: Manya Silvas, MD;  Location: [facility] ENDOSCOPY;  Service: Endoscopy;  Laterality: N/A;  . EYE SURGERY     laser surgery bilat   . HERNIA REPAIR    . KNEE ARTHROSCOPY    . LUMBAR LAMINECTOMY/DECOMPRESSION MICRODISCECTOMY Bilateral 09/14/2015   Procedure: MICRO LUMBAR BILATERAL DECOMPRESSION L4 - L5;  Surgeon: Susa Day, MD;  Location: [facility]S;  Service: Orthopedics;  Laterality: Bilateral;  . REDUCTION MAMMAPLASTY Bilateral 1980  . RHINOPLASTY    . TONSILLECTOMY    . TOTAL KNEE ARTHROPLASTY Left 06/18/2016   Procedure: LEFT TOTAL KNEE ARTHROPLASTY;  Surgeon: Paralee Cancel, MD;  Location: [facility] ORS;  Service: Orthopedics;  Laterality: Left;  Adductor Block  . TOTAL KNEE ARTHROPLASTY Right 02/11/2020   Procedure: TOTAL KNEE ARTHROPLASTY;  Surgeon: Paralee Cancel, MD;  Location: [facility]S;  Service: Orthopedics;  Laterality: Right;  . TUBAL LIGATION    . UVULOPALATOPHARYNGOPLASTY      There  were no vitals filed for this visit.   Subjective Assessment - 06/20/20 1549    Subjective Standing sitting down, rolling over in bed causes dizziness. Concentrating on holding onto something. Vertigo is not as severe as last time (2019). Feels like she is moving around. Neck has bothered her a lot. Treatment for neck was good last time but temporary. Pt states turning the lights down in the treatment room helps.    Pertinent History Ataxia.Pt states her problem is when she turns. Fine when she is straight ahead. Turning makes everything out of focus. Can see things better when walking straight. Bending over and getting back up also causes dizziness. Does not remember any falls for the past 6 months. Has some sways. Stays close to things she can grab for safety.    Patient Stated Goals Pt expresses desire to be more steady when she  walks.    Currently in Pain? No/denies                                     PT Education - 06/20/20 1746    Education Details [place]    Person(s) Educated Patient    Methods Explanation;Demonstration;Tactile cues;Verbal cues;Handout    Comprehension Returned demonstration;Verbalized understanding          Objective   MedbridgeAccess Code [email or web address]     Therapeutic exercise  Cervical extension. Neck pain R and L cervical rotation: neck pain  Dix Hallpike   L (-)    R (+) with 2 upbeats of nystagmus  Pt was recommended to use a SPC for safety until her dizziness resolves  Pt was recommended to move her head slowly instead of quickly to keep crystals in place.    Gait throughout session to assess effectiveness of treatment  Improved exercise technique, movement at target joints, use of target muscles after mod verbal, visual, tactile cues.    Canalith repositioning   Epley maneuver for R side. 1 minute each position, 3 repetitions   2nd attempt, 5/10 dizziness. First attempt, dizziness was worse per pt.   Both eyes oscillating during 2nd attempt.     3rd attempt 4/10 dizziness, no nystagmus observed.    Response to treatment Decreased dizziness with increased repetition for Epley maneuver. Pt states feeling better after session.    Clinical impression Marye Round and Canalith Repositioning performed by Ilean China, PT, DPT. Positive Marye Round for the R and Epley maneuver performed for that side. Decreasing level of dizziness each repetition of Epley maneuver.  Improved feeling of steadiness with gait reported by pt after session. Pt will benefit from continued skilled physical therapy services to improve balance and function.          PT Short Term Goals - 06/07/20 1612      PT SHORT TERM GOAL #1   Title Pt will be independent with her initial HEP to improve balance and steadiness with gait.    Time 3    Period  Weeks    Status New    Target Date 06/30/20             PT Long Term Goals - 06/07/20 1612      PT LONG TERM GOAL #1   Title Patient will improve TUG time to 12 seconds or less as a demonstration of improved functional mobility.    Baseline TUG without AD 19 seconds on average (06/07/2020)  Time 8    Period Weeks    Status New    Target Date 08/04/20      PT LONG TERM GOAL #2   Title Pt will be able to turn to the R and to the L 360 degrees without reports of dizziness or unsteadiness at least 5 times each way to promote ability to turn more safely.    Baseline Turning increases unsteadiness (06/07/2020)    Time 8    Period Weeks    Status New    Target Date 08/04/20                 Plan - 06/20/20 1607    Clinical Impression Statement Marye Round and Canalith Repositioning performed by Ilean China, PT, DPT. Positive Marye Round for the R and Epley maneuver performed for that side. Decreasing level of dizziness each repetition of Epley maneuver.  Improved feeling of steadiness with gait reported by pt after session. Pt will benefit from continued skilled physical therapy services to improve balance and function.    Personal Factors and Comorbidities Comorbidity 3+;Age;Fitness;Past/Current Experience;Time since onset of injury/illness/exacerbation    Comorbidities ADD, HTN, depression, anxiety, hx of concussion    Examination-Activity Limitations Bed Mobility;Bend    Stability/Clinical Decision Making Evolving/Moderate complexity    Rehab Potential Fair    Clinical Impairments Affecting Rehab Potential memory, chronicity of condition, age, hx of concussion    PT Frequency 2x / week    PT Duration 8 weeks    PT Treatment/Interventions Therapeutic activities;Therapeutic exercise;Balance training;Neuromuscular re-education;Patient/family education;Manual techniques;Dry needling;Vestibular;Canalith Repostioning;Electrical Stimulation;Gait training;Stair  training;Functional mobility training    PT Next Visit Plan canalith repositinoing, strengthening, manual techniques, modalities PRN    Consulted and Agree with Plan of Care Patient           Patient will benefit from skilled therapeutic intervention in order to improve the following deficits and impairments:  Abnormal gait,Decreased balance,Decreased strength,Difficulty walking,Dizziness,Improper body mechanics,Postural dysfunction,Pain  Visit Diagnosis: Unsteady gait  Dizziness and giddiness     Problem List Patient Active Problem List   Diagnosis Date Noted  . Osteoarthritis of right knee 02/12/2020  . S/P total knee arthroplasty, right 02/11/2020  . Obese 06/20/2016  . Status post right knee replacement 06/19/2016  . S/P left TKA 06/18/2016  . Spinal stenosis of lumbar region 09/14/2015    Joneen Boers PT, DPT   06/20/2020, 5:52 PM  [facility] [address], [place], [address] Phone: [phone]   Fax:  [phone]  Name: Destiny Shaw MRN: [id number] Date of Birth: Jul 01, 1950

## 2020-06-22 ENCOUNTER — Ambulatory Visit: Payer: Medicare Other

## 2020-06-22 ENCOUNTER — Other Ambulatory Visit: Payer: Self-pay

## 2020-06-22 DIAGNOSIS — R2681 Unsteadiness on feet: Secondary | ICD-10-CM | POA: Diagnosis not present

## 2020-06-22 DIAGNOSIS — R42 Dizziness and giddiness: Secondary | ICD-10-CM

## 2020-06-22 NOTE — Therapy (Signed)
[facility] [address] [place], [address] Phone: [phone]   Fax:  [phone]  Physical Therapy Treatment  Patient Details  Name: Destiny Shaw MRN: [id number] Date of Birth: 15-Dec-1950 Referring Provider (PT): Governor Specking, MD   Encounter Date: 06/22/2020   PT End of Session - 06/22/20 1426    Visit Number 5    Number of Visits 17    Date for PT Re-Evaluation 08/04/20    Authorization Time Period 06/07/20-08/04/20    PT Start Time 4270    PT Stop Time 1427    PT Time Calculation (min) 40 min    Equipment Utilized During Treatment Gait belt    Activity Tolerance Patient tolerated treatment well;No increased pain    Behavior During Therapy WFL for tasks assessed/performed           Past Medical History:  Diagnosis Date  . ADD (attention deficit disorder)   . Anemia   . Anginal pain ([place])    pt states has occas chest pain relates to indigestion; pt uses rest to relieve DR [place]-   . Anxiety   . Arthritis   . Concussion   . Depression   . Diabetes mellitus without complication ([place])    type 2   . Dizziness   . Fall   . GERD (gastroesophageal reflux disease)   . Headache   . History of urinary tract infection   . Hyperlipidemia   . Hypertension   . IBS (irritable bowel syndrome)   . Imbalance   . Numbness    right leg   . Numbness in both hands    comes and goes   . Pneumonia    last episode approx 1 year ago   . Sleep apnea    uses cpap not all the time   . Wears glasses     Past Surgical History:  Procedure Laterality Date  . BREAST CYST ASPIRATION Left 1980's   neg  . BREAST CYST EXCISION Right 1980's   neg  . BREAST LUMPECTOMY Right   . BREAST SURGERY    . CARPAL TUNNEL RELEASE    . CESAREAN SECTION     times 2  . COLONOSCOPY WITH PROPOFOL N/A 05/11/2015   Procedure: COLONOSCOPY WITH PROPOFOL;  Surgeon: Manya Silvas, MD;  Location: [facility]  ENDOSCOPY;  Service: Endoscopy;  Laterality: N/A;  . DE QUERVAIN'S RELEASE Right   . ESOPHAGOGASTRODUODENOSCOPY (EGD) WITH PROPOFOL N/A 05/11/2015   Procedure: ESOPHAGOGASTRODUODENOSCOPY (EGD) WITH PROPOFOL;  Surgeon: Manya Silvas, MD;  Location: [facility] ENDOSCOPY;  Service: Endoscopy;  Laterality: N/A;  . EYE SURGERY     laser surgery bilat   . HERNIA REPAIR    . KNEE ARTHROSCOPY    . LUMBAR LAMINECTOMY/DECOMPRESSION MICRODISCECTOMY Bilateral 09/14/2015   Procedure: MICRO LUMBAR BILATERAL DECOMPRESSION L4 - L5;  Surgeon: Susa Day, MD;  Location: [facility];  Service: Orthopedics;  Laterality: Bilateral;  . REDUCTION MAMMAPLASTY Bilateral 1980  . RHINOPLASTY    . TONSILLECTOMY    . TOTAL KNEE ARTHROPLASTY Left 06/18/2016   Procedure: LEFT TOTAL KNEE ARTHROPLASTY;  Surgeon: Paralee Cancel, MD;  Location: [facility];  Service: Orthopedics;  Laterality: Left;  Adductor Block  . TOTAL KNEE ARTHROPLASTY Right 02/11/2020   Procedure: TOTAL KNEE ARTHROPLASTY;  Surgeon: Paralee Cancel, MD;  Location: [facility];  Service: Orthopedics;  Laterality: Right;  . TUBAL LIGATION    . UVULOPALATOPHARYNGOPLASTY  There were no vitals filed for this visit.   Subjective Assessment - 06/22/20 1421    Subjective Pt returns to clinic reports doing fairly well today. Reports some worse dizziness episode after last session once she got home. She continues to have exacerbation of symptoms with kitchen activty whence involving frequency head turns, visual tracking changes, excessive peripheral vision movement during central focus.    Pertinent History Disquilibirum in gait, particulary with changes in direction of gait. .Pt states her problem is when she turns. Fine when she is straight ahead. Turning makes everything out of focus. Can see things better when walking straight. Bending over and getting back up also causes dizziness. Does not remember any falls for the past 6 months. Has some sways. Stays close to things she can  grab for safety.    Currently in Pain? Yes    Pain Score 3     Pain Location Knee   intermitten tpain         INTERVENTION:  Neuromuscular Re-education:  Dix-Hallpike testing: Performed left and right Dix-Hallpike test and both were negative with no nystagmus observed and patient denied vertigo.  Head shaking nystagmus test: negative test with no nystagmus observed  VOR X 1 test: positive test with patient reporting dizziness   VOR X 1: Demonstrated and educated patient as to VOR X1 exercise. Patient performed VOR X 1 horizontal in sitting 1 rep of 30 seconds. Patient reported 4/10 dizziness. Repeated with patient performing VOR x 1 for 1 60 second rep and patient reported 6/10 dizziness. Patient reports blurring of target at increased speeds.  Issued VOR X 1 horizontal with plain background in sitting 3 reps of 30 seconds each three times a day for HEP.  *Handout provided and added to [employer] HEP    Proprioceptive Sensory Integration Training  -Eyes closed normal stance on airex pad (5x30sec) minGuard assist  -Eyes open normal stance on airex pad c horizontal head turns 20x alternating sides  -Airex pad normal stance with soccer ball self toss/catch + eye tracking x20 (struggles with maintaining gaze on ball, despite resetting several times and repeated cues)  -figure 8 walking x10, dizziness upon final lap      PT Short Term Goals - 06/07/20 1612      PT SHORT TERM GOAL #1   Title Pt will be independent with her initial HEP to improve balance and steadiness with gait.    Time 3    Period Weeks    Status New    Target Date 06/30/20             PT Long Term Goals - 06/07/20 1612      PT LONG TERM GOAL #1   Title Patient will improve TUG time to 12 seconds or less as a demonstration of improved functional mobility.    Baseline TUG without AD 19 seconds on average (06/07/2020)    Time 8    Period Weeks    Status New    Target Date 08/04/20      PT LONG TERM GOAL  #2   Title Pt will be able to turn to the R and to the L 360 degrees without reports of dizziness or unsteadiness at least 5 times each way to promote ability to turn more safely.    Baseline Turning increases unsteadiness (06/07/2020)    Time 8    Period Weeks    Status New    Target Date 08/04/20  Plan - 06/22/20 1427    Clinical Impression Statement Continued with current plan of care as laid out in evaluation and recent prior sessions. Retest of BPPV shows sustain improvements after treatment last session, however pt continues to have easily provoked dizziness/nausea with sensory overload of the visual system, indicative of some VOR hypofunction. HEP updated for this and accomodation training commenced. Pt remains motivated to advance progress toward goals. Rest breaks provided as needed, pt quick to ask when needed. Pt does require varying levels of assistance and cuing for completion of exercises for correct form and sometimes due to pain/weakness. Vestibular assessment and treatment directed by Lady Deutscher, PT. Pt continues to demonstrate progress toward goals AEB progression of some interventions this date either in volume or intensity.   Personal Factors and Comorbidities Comorbidity 3+;Age;Fitness;Past/Current Experience;Time since onset of injury/illness/exacerbation    Comorbidities ADD, HTN, depression, anxiety, hx of concussion    Examination-Activity Limitations Bed Mobility;Bend    Stability/Clinical Decision Making Evolving/Moderate complexity    Clinical Decision Making Moderate    Rehab Potential Fair    Clinical Impairments Affecting Rehab Potential memory, chronicity of condition, age, hx of concussion    PT Frequency 2x / week    PT Duration 8 weeks    PT Treatment/Interventions Therapeutic activities;Therapeutic exercise;Balance training;Neuromuscular re-education;Patient/family education;Manual techniques;Dry needling;Vestibular;Canalith  Repostioning;Electrical Stimulation;Gait training;Stair training;Functional mobility training    PT Next Visit Plan canalith repositinoing, strengthening, manual techniques, modalities PRN    PT Home Exercise Plan Medbridge Access Code [email or web address]    ZSMOLMBEM and Agree with Plan of Care Patient           Patient will benefit from skilled therapeutic intervention in order to improve the following deficits and impairments:  Abnormal gait,Decreased balance,Decreased strength,Difficulty walking,Dizziness,Improper body mechanics,Postural dysfunction,Pain  Visit Diagnosis: Unsteady gait  Dizziness and giddiness     Problem List Patient Active Problem List   Diagnosis Date Noted  . Osteoarthritis of right knee 02/12/2020  . S/P total knee arthroplasty, right 02/11/2020  . Obese 06/20/2016  . Status post right knee replacement 06/19/2016  . S/P left TKA 06/18/2016  . Spinal stenosis of lumbar region 09/14/2015   3:00 PM, 06/22/20 Etta Grandchild, PT, DPT Physical Therapist - [place] [phone] (Office)    Jennye Runquist C 06/22/2020, 2:48 PM  [facility] [address], [place], [address] Phone: [phone]   Fax:  [phone]  Name: Destiny Shaw MRN: [id number] Date of Birth: November 26, 1950

## 2020-06-28 ENCOUNTER — Other Ambulatory Visit: Payer: Self-pay

## 2020-06-28 ENCOUNTER — Ambulatory Visit: Payer: Medicare Other | Attending: Orthopedic Surgery

## 2020-06-28 DIAGNOSIS — R42 Dizziness and giddiness: Secondary | ICD-10-CM | POA: Diagnosis present

## 2020-06-28 DIAGNOSIS — R2681 Unsteadiness on feet: Secondary | ICD-10-CM | POA: Diagnosis present

## 2020-06-28 NOTE — Therapy (Signed)
[facility] [address], [place], [address] Phone: [phone]   Fax:  [phone]  Physical Therapy Treatment  Patient Details  Name: Destiny Shaw MRN: [id number] Date of Birth: 05/22/1950 Referring Provider (PT): Governor Specking, MD   Encounter Date: 06/28/2020   PT End of Session - 06/28/20 1334    Visit Number 6    Number of Visits 17    Date for PT Re-Evaluation 08/04/20    Authorization Time Period 06/07/20-08/04/20    PT Start Time 1660    PT Stop Time 1416    PT Time Calculation (min) 42 min    Equipment Utilized During Treatment --    Activity Tolerance Patient tolerated treatment well    Behavior During Therapy [facility] for tasks assessed/performed           Past Medical History:  Diagnosis Date  . ADD (attention deficit disorder)   . Anemia   . Anginal pain ([place])    pt states has occas chest pain relates to indigestion; pt uses rest to relieve DR [place]-   . Anxiety   . Arthritis   . Concussion   . Depression   . Diabetes mellitus without complication ([place])    type 2   . Dizziness   . Fall   . GERD (gastroesophageal reflux disease)   . Headache   . History of urinary tract infection   . Hyperlipidemia   . Hypertension   . IBS (irritable bowel syndrome)   . Imbalance   . Numbness    right leg   . Numbness in both hands    comes and goes   . Pneumonia    last episode approx 1 year ago   . Sleep apnea    uses cpap not all the time   . Wears glasses     Past Surgical History:  Procedure Laterality Date  . BREAST CYST ASPIRATION Left 1980's   neg  . BREAST CYST EXCISION Right 1980's   neg  . BREAST LUMPECTOMY Right   . BREAST SURGERY    . CARPAL TUNNEL RELEASE    . CESAREAN SECTION     times 2  . COLONOSCOPY WITH PROPOFOL N/A 05/11/2015   Procedure: COLONOSCOPY WITH PROPOFOL;  Surgeon: Manya Silvas, MD;  Location: [facility] ENDOSCOPY;  Service:  Endoscopy;  Laterality: N/A;  . DE QUERVAIN'S RELEASE Right   . ESOPHAGOGASTRODUODENOSCOPY (EGD) WITH PROPOFOL N/A 05/11/2015   Procedure: ESOPHAGOGASTRODUODENOSCOPY (EGD) WITH PROPOFOL;  Surgeon: Manya Silvas, MD;  Location[facility] ENDOSCOPY;  Service: Endoscopy;  Laterality: N/A;  . EYE SURGERY     laser surgery bilat   . HERNIA REPAIR    . KNEE ARTHROSCOPY    . LUMBAR LAMINECTOMY/DECOMPRESSION MICRODISCECTOMY Bilateral 09/14/2015   Procedure: MICRO LUMBAR BILATERAL DECOMPRESSION L4 - L5;  Surgeon: Susa Day, MD;  Location: [facility];  Service: Orthopedics;  Laterality: Bilateral;  . REDUCTION MAMMAPLASTY Bilateral 1980  . RHINOPLASTY    . TONSILLECTOMY    . TOTAL KNEE ARTHROPLASTY Left 06/18/2016   Procedure: LEFT TOTAL KNEE ARTHROPLASTY;  Surgeon: Paralee Cancel, MD;  Location: [facility];  Service: Orthopedics;  Laterality: Left;  Adductor Block  . TOTAL KNEE ARTHROPLASTY Right 02/11/2020   Procedure: TOTAL KNEE ARTHROPLASTY;  Surgeon: Paralee Cancel, MD;  Location: [facility];  Service: Orthopedics;  Laterality: Right;  . TUBAL LIGATION    . UVULOPALATOPHARYNGOPLASTY      There were  no vitals filed for this visit.   Subjective Assessment - 06/28/20 1335    Subjective Dizziness is a 5-6/10 right now. Not bad at all compared to what it can be. Was nauseous after last session. Not feeling nauseaous right now. Not usually nauseous at rest. Walking around and turning her head makes her nauseous. Had dizzy spells when she does the exercises (VOR x 1) and feels like it is making her worse. Not really seeing any improvement.    Pertinent History Disquilibirum in gait, particulary with changes in direction of gait. .Pt states her problem is when she turns. Fine when she is straight ahead. Turning makes everything out of focus. Can see things better when walking straight. Bending over and getting back up also causes dizziness. Does not remember any falls for the past 6 months. Has some sways. Stays close  to things she can grab for safety.    Currently in Pain? No/denies   Dizzy                                    PT Education - 06/28/20 2050    Education Details ther-ex    Person(s) Educated Patient    Methods Explanation;Demonstration;Tactile cues;Verbal cues    Comprehension Returned demonstration;Verbalized understanding           INTERVENTION:  MedbridgeAccess Code [email or web address]  Therapeutic exercise  Cervical rotation  R: feels like something is spinning inside  L: no symptoms.   Seated manually resisted scapular retraction targeting lower trap muscles  R 10x5 seconds for 3 sets  L 10x5 seconds for 3 sets   seated B scapular retraction 10x5 seconds for 2 sets  Responds well to "stick your chest out" cue  Pressing tognue at roof of mouth to activate anterior cervical muscles to decrease cervical paraspinal muscle tensino 10x5 secodns      Improved exercise technique, movement at target joints, use of target muscles after mod verbal, visual, tactile cues.    Manual therapy Seated STM B cervcal paraspinal muscles, B upper trap muscles to decrease tension and fascial restrictions  R > L  Decreased dizziness, improved R cervical rotation AROM per pt.    Response to treatment Neck feels better after session with pt reports of being able to look around without her head spinning.    Clinical impression Decreased dizziness with R cervical rotation with treatment to decrease pericervical and B upper trap muscle tension today. Worked on improving lower trap muscle strength as well to decrease B upper trap muscle tension. Possible cervicogenic involvement in dizziness. Pt tolerated session well without aggravation of symptoms. Pt will benefit from continued skilled physical therapy services to decrease dizziness, improve balance and function.        PT Short Term Goals - 06/07/20 1612      PT SHORT TERM GOAL #1   Title Pt will be  independent with her initial HEP to improve balance and steadiness with gait.    Time 3    Period Weeks    Status New    Target Date 06/30/20             PT Long Term Goals - 06/07/20 1612      PT LONG TERM GOAL #1   Title Patient will improve TUG time to 12 seconds or less as a demonstration of improved functional mobility.    Baseline TUG without AD 19 seconds  on average (06/07/2020)    Time 8    Period Weeks    Status New    Target Date 08/04/20      PT LONG TERM GOAL #2   Title Pt will be able to turn to the R and to the L 360 degrees without reports of dizziness or unsteadiness at least 5 times each way to promote ability to turn more safely.    Baseline Turning increases unsteadiness (06/07/2020)    Time 8    Period Weeks    Status New    Target Date 08/04/20                 Plan - 06/28/20 2050    Clinical Impression Statement Decreased dizziness with R cervical rotation with treatment to decrease pericervical and B upper trap muscle tension today. Worked on improving lower trap muscle strength as well to decrease B upper trap muscle tension. Possible cervicogenic involvement in dizziness. Pt tolerated session well without aggravation of symptoms. Pt will benefit from continued skilled physical therapy services to decrease dizziness, improve balance and function.    Personal Factors and Comorbidities Comorbidity 3+;Age;Fitness;Past/Current Experience;Time since onset of injury/illness/exacerbation    Comorbidities ADD, HTN, depression, anxiety, hx of concussion    Examination-Activity Limitations Bed Mobility;Bend    Stability/Clinical Decision Making Evolving/Moderate complexity    Rehab Potential Fair    Clinical Impairments Affecting Rehab Potential memory, chronicity of condition, age, hx of concussion    PT Frequency 2x / week    PT Duration 8 weeks    PT Treatment/Interventions Therapeutic activities;Therapeutic exercise;Balance training;Neuromuscular  re-education;Patient/family education;Manual techniques;Dry needling;Vestibular;Canalith Repostioning;Electrical Stimulation;Gait training;Stair training;Functional mobility training    PT Next Visit Plan canalith repositinoing, strengthening, manual techniques, modalities PRN    Consulted and Agree with Plan of Care Patient           Patient will benefit from skilled therapeutic intervention in order to improve the following deficits and impairments:  Abnormal gait,Decreased balance,Decreased strength,Difficulty walking,Dizziness,Improper body mechanics,Postural dysfunction,Pain  Visit Diagnosis: Unsteady gait  Dizziness and giddiness     Problem List Patient Active Problem List   Diagnosis Date Noted  . Osteoarthritis of right knee 02/12/2020  . S/P total knee arthroplasty, right 02/11/2020  . Obese 06/20/2016  . Status post right knee replacement 06/19/2016  . S/P left TKA 06/18/2016  . Spinal stenosis of lumbar region 09/14/2015    Joneen Boers PT, DPT   06/28/2020, 8:58 PM  [facility] [address] [place], [address] Phone: [phone]   Fax:  [phone]  Name: Destiny Shaw MRN: [id number] Date of Birth: 1950-04-28

## 2020-06-30 ENCOUNTER — Ambulatory Visit: Payer: Medicare Other

## 2020-06-30 ENCOUNTER — Other Ambulatory Visit: Payer: Self-pay

## 2020-06-30 DIAGNOSIS — R2681 Unsteadiness on feet: Secondary | ICD-10-CM

## 2020-06-30 DIAGNOSIS — R42 Dizziness and giddiness: Secondary | ICD-10-CM

## 2020-06-30 NOTE — Therapy (Signed)
[facility] [address] [place], [address] Phone: [phone]   Fax:  [phone]  Physical Therapy Treatment  Patient Details  Name: Destiny Shaw MRN: [id number] Date of Birth: Sep 08, 1950 Referring Provider (PT): Governor Specking, MD   Encounter Date: 06/30/2020   PT End of Session - 06/30/20 1333    Visit Number 7    Number of Visits 17    Date for PT Re-Evaluation 08/04/20    Authorization Time Period 06/07/20-08/04/20    PT Start Time 1333    PT Stop Time 1414    PT Time Calculation (min) 41 min    Activity Tolerance Patient tolerated treatment well    Behavior During Therapy [facility] for tasks assessed/performed           Past Medical History:  Diagnosis Date  . ADD (attention deficit disorder)   . Anemia   . Anginal pain ([place])    pt states has occas chest pain relates to indigestion; pt uses rest to relieve DR [facility]-   . Anxiety   . Arthritis   . Concussion   . Depression   . Diabetes mellitus without complication ([place])    type 2   . Dizziness   . Fall   . GERD (gastroesophageal reflux disease)   . Headache   . History of urinary tract infection   . Hyperlipidemia   . Hypertension   . IBS (irritable bowel syndrome)   . Imbalance   . Numbness    right leg   . Numbness in both hands    comes and goes   . Pneumonia    last episode approx 1 year ago   . Sleep apnea    uses cpap not all the time   . Wears glasses     Past Surgical History:  Procedure Laterality Date  . BREAST CYST ASPIRATION Left 1980's   neg  . BREAST CYST EXCISION Right 1980's   neg  . BREAST LUMPECTOMY Right   . BREAST SURGERY    . CARPAL TUNNEL RELEASE    . CESAREAN SECTION     times 2  . COLONOSCOPY WITH PROPOFOL N/A 05/11/2015   Procedure: COLONOSCOPY WITH PROPOFOL;  Surgeon: Manya Silvas, MD;  Location[facility] ENDOSCOPY;  Service: Endoscopy;  Laterality: N/A;  . DE QUERVAIN'S  RELEASE Right   . ESOPHAGOGASTRODUODENOSCOPY (EGD) WITH PROPOFOL N/A 05/11/2015   Procedure: ESOPHAGOGASTRODUODENOSCOPY (EGD) WITH PROPOFOL;  Surgeon: Manya Silvas, MD;  Location: [facility] ENDOSCOPY;  Service: Endoscopy;  Laterality: N/A;  . EYE SURGERY     laser surgery bilat   . HERNIA REPAIR    . KNEE ARTHROSCOPY    . LUMBAR LAMINECTOMY/DECOMPRESSION MICRODISCECTOMY Bilateral 09/14/2015   Procedure: MICRO LUMBAR BILATERAL DECOMPRESSION L4 - L5;  Surgeon: Susa Day, MD;  Location: [facility] ORS;  Service: Orthopedics;  Laterality: Bilateral;  . REDUCTION MAMMAPLASTY Bilateral 1980  . RHINOPLASTY    . TONSILLECTOMY    . TOTAL KNEE ARTHROPLASTY Left 06/18/2016   Procedure: LEFT TOTAL KNEE ARTHROPLASTY;  Surgeon: Paralee Cancel, MD;  Location: [facility] ORS;  Service: Orthopedics;  Laterality: Left;  Adductor Block  . TOTAL KNEE ARTHROPLASTY Right 02/11/2020   Procedure: TOTAL KNEE ARTHROPLASTY;  Surgeon: Paralee Cancel, MD;  Location: [facility] ORS;  Service: Orthopedics;  Laterality: Right;  . TUBAL LIGATION    . UVULOPALATOPHARYNGOPLASTY      There were no vitals filed for this visit.  Subjective Assessment - 06/30/20 1334    Subjective Balance and dizziness is better. Felt better after session. Felt so much better after last session. Did not feel like she was in danger of falling as much. Still had a few times when she had to grab something but shes better.    Pertinent History Disquilibirum in gait, particulary with changes in direction of gait. .Pt states her problem is when she turns. Fine when she is straight ahead. Turning makes everything out of focus. Can see things better when walking straight. Bending over and getting back up also causes dizziness. Does not remember any falls for the past 6 months. Has some sways. Stays close to things she can grab for safety.    Currently in Pain? No/denies                                     PT Education - 06/30/20 1404    Education  Details ther-ex    Person(s) Educated Patient    Methods Explanation;Demonstration;Tactile cues;Verbal cues    Comprehension Returned demonstration;Verbalized understanding          INTERVENTION:  MedbridgeAccess Code [email or web address]  Therapeutic exercise  Cervical rotation             R: vision looks a little foggy             L: no symptoms.   Seated manually resisted scapular retraction targeting lower trap muscles             R 10x5 seconds for 3 sets             L 10x5 seconds for 3 sets  Supine chin tucks 10x5 seconds for 3 sets increased visual appearance of room moving, eases with rest.   seated B scapular retraction 10x5 seconds for 3 sets             Responds well to "stick your chest out" cue  Seated thoracic extension over chair 10x5 seconds for 2 sets  Cervical rotation             R: much improved vision/decreased fogginess             L: no symptoms.   Pt was recommended vestibular rehab at the hospital location after finishing up here. Pt verbalized understanding.    Improved exercise technique, movement at target joints, use of target muscles after mod verbal, visual, tactile cues.    Manual therapy  Seated STM B cervcal paraspinal muscles, B upper trap muscles to decrease tension and fascial restrictions             R > L               Response to treatment Fair tolerance to today's session.    Clinical impression Decreased dizziness after treatment. Pt however reported dizziness when pt continued to walk after session. Pt may benefit from vestibular rehab at the hospital location after finishing up in this clinic. Pt will benefit from continued skilled physical therapy services to improve strength, balance, and function.         PT Short Term Goals - 06/07/20 1612      PT SHORT TERM GOAL #1   Title Pt will be independent with her initial HEP to improve balance and steadiness with gait.    Time 3    Period Weeks    Status New  Target Date 06/30/20             PT Long Term Goals - 06/07/20 1612      PT LONG TERM GOAL #1   Title Patient will improve TUG time to 12 seconds or less as a demonstration of improved functional mobility.    Baseline TUG without AD 19 seconds on average (06/07/2020)    Time 8    Period Weeks    Status New    Target Date 08/04/20      PT LONG TERM GOAL #2   Title Pt will be able to turn to the R and to the L 360 degrees without reports of dizziness or unsteadiness at least 5 times each way to promote ability to turn more safely.    Baseline Turning increases unsteadiness (06/07/2020)    Time 8    Period Weeks    Status New    Target Date 08/04/20                 Plan - 06/30/20 1404    Clinical Impression Statement Decreased dizziness after treatment. Pt however reported dizziness when pt continued to walk after session. Pt may benefit from vestibular rehab at the hospital location after finishing up in this clinic. Pt will benefit from continued skilled physical therapy services to improve strength, balance, and function.    Personal Factors and Comorbidities Comorbidity 3+;Age;Fitness;Past/Current Experience;Time since onset of injury/illness/exacerbation    Comorbidities ADD, HTN, depression, anxiety, hx of concussion    Examination-Activity Limitations Bed Mobility;Bend    Stability/Clinical Decision Making Stable/Uncomplicated    Clinical Decision Making Low    Rehab Potential Fair    Clinical Impairments Affecting Rehab Potential memory, chronicity of condition, age, hx of concussion    PT Frequency 2x / week    PT Duration 8 weeks    PT Treatment/Interventions Therapeutic activities;Therapeutic exercise;Balance training;Neuromuscular re-education;Patient/family education;Manual techniques;Dry needling;Vestibular;Canalith Repostioning;Electrical Stimulation;Gait training;Stair training;Functional mobility training    PT Next Visit Plan canalith repositinoing,  strengthening, manual techniques, modalities PRN    Consulted and Agree with Plan of Care Patient           Patient will benefit from skilled therapeutic intervention in order to improve the following deficits and impairments:  Abnormal gait,Decreased balance,Decreased strength,Difficulty walking,Dizziness,Improper body mechanics,Postural dysfunction,Pain  Visit Diagnosis: Unsteady gait  Dizziness and giddiness     Problem List Patient Active Problem List   Diagnosis Date Noted  . Osteoarthritis of right knee 02/12/2020  . S/P total knee arthroplasty, right 02/11/2020  . Obese 06/20/2016  . Status post right knee replacement 06/19/2016  . S/P left TKA 06/18/2016  . Spinal stenosis of lumbar region 09/14/2015    Joneen Boers PT, DPT  06/30/2020, 3:29 PM  [facility] [address] [place], [address] Phone: [phone]   Fax:  [phone]  Name: Destiny Shaw MRN: [id number] Date of Birth: 08/02/1950

## 2020-07-05 ENCOUNTER — Other Ambulatory Visit: Payer: Medicare Other

## 2020-07-05 ENCOUNTER — Ambulatory Visit: Payer: Medicare Other

## 2020-07-05 ENCOUNTER — Ambulatory Visit: Payer: Medicare Other | Attending: Internal Medicine

## 2020-07-05 DIAGNOSIS — Z20822 Contact with and (suspected) exposure to covid-19: Secondary | ICD-10-CM

## 2020-07-06 LAB — SARS-COV-2, NAA 2 DAY TAT

## 2020-07-06 LAB — NOVEL CORONAVIRUS, NAA: SARS-CoV-2, NAA: DETECTED — AB

## 2020-07-07 ENCOUNTER — Ambulatory Visit: Payer: Medicare Other

## 2020-07-12 ENCOUNTER — Ambulatory Visit: Payer: Medicare Other

## 2020-07-14 ENCOUNTER — Ambulatory Visit: Payer: Medicare Other

## 2020-07-19 ENCOUNTER — Ambulatory Visit: Payer: Medicare Other

## 2020-07-26 ENCOUNTER — Ambulatory Visit: Payer: Medicare Other

## 2020-07-28 ENCOUNTER — Ambulatory Visit: Payer: Medicare Other

## 2020-08-02 ENCOUNTER — Telehealth: Payer: Self-pay

## 2020-08-02 ENCOUNTER — Ambulatory Visit: Payer: Medicare Other | Attending: Orthopedic Surgery

## 2020-08-02 NOTE — Telephone Encounter (Signed)
No show. Called patient who said that her doctor told her to wait for an MRI for her back. Pt will call back to schedule more PT afterwards. Cancel for now.  Recent onset of L lateral leg pain while having to take a break from PT. Pt states being barefoot a lot since she was just in the house. Had an injection yesterday for her low back which helped temporarily. Pt was recommended to wear supportive shoes and perform her lumbar home exercises provided from previous treatment (secondary to possible L 5 dermatome or superficial peroneal nerve involvement from foot pronation). Pt verbalized understanding and wrote a note so she can remember due to difficulty with memory. Pt was also recommended to get a prescription for vestibular rehab to work more on balance. Pt verbalized understanding.

## 2020-08-04 ENCOUNTER — Ambulatory Visit: Payer: Medicare Other

## 2021-04-18 ENCOUNTER — Other Ambulatory Visit: Payer: Self-pay

## 2021-04-18 ENCOUNTER — Other Ambulatory Visit: Payer: Self-pay | Admitting: Student

## 2021-04-18 DIAGNOSIS — G3184 Mild cognitive impairment, so stated: Secondary | ICD-10-CM

## 2021-04-18 DIAGNOSIS — R42 Dizziness and giddiness: Secondary | ICD-10-CM

## 2021-04-28 ENCOUNTER — Other Ambulatory Visit: Payer: Self-pay | Admitting: Student

## 2021-04-28 ENCOUNTER — Other Ambulatory Visit: Payer: Self-pay

## 2021-04-28 ENCOUNTER — Ambulatory Visit
Admission: RE | Admit: 2021-04-28 | Discharge: 2021-04-28 | Disposition: A | Discharge status: Home / Self Care | Payer: Medicare Other | Source: Ambulatory Visit | Attending: Student | Admitting: Student

## 2021-04-28 DIAGNOSIS — R42 Dizziness and giddiness: Secondary | ICD-10-CM

## 2021-04-28 DIAGNOSIS — G3184 Mild cognitive impairment, so stated: Secondary | ICD-10-CM | POA: Diagnosis present

## 2021-05-29 ENCOUNTER — Ambulatory Visit: Payer: Medicare Other | Attending: Physical Medicine and Rehabilitation

## 2021-05-29 DIAGNOSIS — M5416 Radiculopathy, lumbar region: Secondary | ICD-10-CM | POA: Insufficient documentation

## 2021-05-29 DIAGNOSIS — M6281 Muscle weakness (generalized): Secondary | ICD-10-CM | POA: Insufficient documentation

## 2021-05-29 DIAGNOSIS — M5459 Other low back pain: Secondary | ICD-10-CM | POA: Diagnosis not present

## 2021-05-29 DIAGNOSIS — Z9181 History of falling: Secondary | ICD-10-CM | POA: Diagnosis present

## 2021-05-29 DIAGNOSIS — R2681 Unsteadiness on feet: Secondary | ICD-10-CM | POA: Insufficient documentation

## 2021-05-29 NOTE — Therapy (Signed)
[facility] ?[address] ?[place], [place], [address] ?Phone: [phone]   Fax:  [phone] ? ?Physical Therapy Evaluation ? ?Patient Details  ?Name: Destiny Shaw ?MRN: [id number] ?Date of Birth: 1950/05/16 ?Referring Provider (PT): Suella Broad, MD ? ? ?Encounter Date: 05/29/2021 ? ? PT End of Session - 05/29/21 1507   ? ? Visit Number 1   ? Number of Visits 17   ? Date for PT Re-Evaluation 07/27/21   ? Authorization Type 1   ? Authorization Time Period of 10 progress report   ? PT Start Time 1507   ? PT Stop Time 1551   ? PT Time Calculation (min) 44 min   ? Activity Tolerance Patient tolerated treatment well   ? Behavior During Therapy [facility] for tasks assessed/performed   ? ?  ?  ? ?  ? ? ?Past Medical History:  ?Diagnosis Date  ? ADD (attention deficit disorder)   ? Anemia   ? Anginal pain ([place])   ? pt states has occas chest pain relates to indigestion; pt uses rest to relieve DR [place]-   ? Anxiety   ? Arthritis   ? Concussion   ? Depression   ? Diabetes mellitus without complication ([place])   ? type 2   ? Dizziness   ? Fall   ? GERD (gastroesophageal reflux disease)   ? Headache   ? History of urinary tract infection   ? Hyperlipidemia   ? Hypertension   ? IBS (irritable bowel syndrome)   ? Imbalance   ? Numbness   ? right leg   ? Numbness in both hands   ? comes and goes   ? Pneumonia   ? last episode approx 1 year ago   ? Sleep apnea   ? uses cpap not all the time   ? Wears glasses   ? ? ?Past Surgical History:  ?Procedure Laterality Date  ? BREAST CYST ASPIRATION Left 1980's  ? neg  ? BREAST CYST EXCISION Right 1980's  ? neg  ? BREAST LUMPECTOMY Right   ? BREAST SURGERY    ? CARPAL TUNNEL RELEASE    ? CESAREAN SECTION    ? times 2  ? COLONOSCOPY WITH PROPOFOL N/A 05/11/2015  ? Procedure: COLONOSCOPY WITH PROPOFOL;  Surgeon: Manya Silvas, MD;  Location: [facility] ENDOSCOPY;  Service: Endoscopy;  Laterality: N/A;  ? DE  QUERVAIN'S RELEASE Right   ? ESOPHAGOGASTRODUODENOSCOPY (EGD) WITH PROPOFOL N/A 05/11/2015  ? Procedure: ESOPHAGOGASTRODUODENOSCOPY (EGD) WITH PROPOFOL;  Surgeon: Manya Silvas, MD;  Location: [facility] ENDOSCOPY;  Service: Endoscopy;  Laterality: N/A;  ? EYE SURGERY    ? laser surgery bilat   ? HERNIA REPAIR    ? KNEE ARTHROSCOPY    ? LUMBAR LAMINECTOMY/DECOMPRESSION MICRODISCECTOMY Bilateral 09/14/2015  ? Procedure: MICRO LUMBAR BILATERAL DECOMPRESSION L4 - L5;  Surgeon: Susa Day, MD;  Location: [facility];  Service: Orthopedics;  Laterality: Bilateral;  ? REDUCTION MAMMAPLASTY Bilateral 1980  ? RHINOPLASTY    ? TONSILLECTOMY    ? TOTAL KNEE ARTHROPLASTY Left 06/18/2016  ? Procedure: LEFT TOTAL KNEE ARTHROPLASTY;  Surgeon: Paralee Cancel, MD;  Location: [facility]S;  Service: Orthopedics;  Laterality: Left;  Adductor Block  ? TOTAL KNEE ARTHROPLASTY Right 02/11/2020  ? Procedure: TOTAL KNEE ARTHROPLASTY;  Surgeon: Paralee Cancel, MD;  Location: [facility];  Service: Orthopedics;  Laterality: Right;  ? TUBAL LIGATION    ? UVULOPALATOPHARYNGOPLASTY    ? ? ?  There were no vitals filed for this visit. ? ? ? Subjective Assessment - 05/29/21 1509   ? ? Subjective Low back pain: 6/10 currently (burning and aching pain), 9/10 at worst for about 15 minutes.   ? Pertinent History Low back pain. Had limited success with lumbar injections. Temporary reliefe and pain returned a few days. Pt scuffles her feet when she walks. Having trouble with balance and pt bumping into stuff. Has a rolator for balance about a week ago. No radiating symptoms today. Pain is in L low back and L posterior and lateral hip currently. Has bladder incontinence for the past few months. Unsure if she told her doctor about the bladder. Dr. Manuella Ghazi is aware of her bladder challenges.  No saddle anesthesia. Does not do as much now as she did before. Has hydrocephalus. Has an appontment at the end of April about the hydrocephalus.   Husband is the historian  ? Patient Stated  Goals Decrease back pain   ? Currently in Pain? Yes   ? Pain Score 6    ? Pain Orientation Left;Posterior;Lower   ? Pain Descriptors / Indicators Aching;Burning   ? Pain Type Chronic pain   ? Pain Onset More than a month ago   ? Pain Frequency Constant   ? Aggravating Factors  Getting into and out of bed (had a few episodes when she tried getting out of bed and fell onto the floor. Has a hard time getting up from the floor), picking up something from the floor, prolonged standing about 4-5 minutes.   ? Pain Relieving Factors fetal position (R side)   ? ?  ?  ? ?  ? ? ? ? ? [facility] PT Assessment - 05/29/21 1527   ? ?  ? Assessment  ? Medical Diagnosis Radiculopathy lumbar region   ? Referring Provider (PT) Suella Broad, MD   ? Onset Date/Surgical Date 05/02/21   ?  ? Precautions  ? Precaution Comments Fall risk   ?  ? Restrictions  ? Other Position/Activity Restrictions No known restrictions   ?  ? Balance Screen  ? Has the patient fallen in the past 6 months Yes   ? How many times? 6   fell trying to get out of her bed  ? Has the patient had a decrease in activity level because of a fear of falling?  Yes   ? Is the patient reluctant to leave their home because of a fear of falling?  Yes   ?  ? Observation/Other Assessments  ? Focus on Therapeutic Outcomes (FOTO)  Lumbar Spine FOTO 23   ?  ? Posture/Postural Control  ? Posture Comments L shoulder  higher, Slight R lateral shift, movement crease T12/L1, L lumbar rotation posture.   ?  ? AROM  ? Lumbar Flexion WFL with L low back pain   ? Lumbar Extension WFL   ? Lumbar - Right Side [facility]   ? Lumbar - Left Side [facility] with L low back pain   ? Lumbar - Right Rotation WFL with R low back pain   ? Lumbar - Left Rotation WFL with L Low back pain.   ?  ? Strength  ? Right Hip Flexion 4-/5   ? Right Hip Extension 4/5   seated manually resisted  ? Right Hip ABduction 4/5   seated manually resisted clamshell  ? Left Hip Flexion 4-/5   ? Left Hip Extension 4-/5   seated  manually resisted  ?  Left Hip ABduction 4-/5   seated manually resisted clamshell  ? Right Knee Flexion 4+/5   ? Right Knee Extension 4+/5   ? Left Knee Flexion 4/5   ? Left Knee Extension 4+/5   ?  ? Palpation  ? Palpation comment Muscle tension B lumbar paraspinal muscles.  TTP L L4 area.   ?  ? Ambulation/Gait  ? Gait Comments Backward lean, lateral lean, decreased step length during gait   ? ?  ?  ? ?  ? ? ? ? ? ? ? ? ? ? ? ? ? ?Objective measurements completed on examination: See above findings.  ? ? ? ? ? ? ?Blood pressure is controlled per pt.  ? ? ?More unstable/unsteady for the past year progressively per husband ?Slight R lateral shift, movement crease T12/L1, L lumbar rotation posture.  ? ? ?Sit to stand: unsteady with backwards weight shift onto heel  ?Per husband, pt loses balance as she tries to stand up.  ? ?Therapeutic exercise ?Sit <> stand from low mat table, emphasis on placing and maintaing her COG over her base of supprt. 5x ? Improved steadiness with transfer.  ? ?Improved exercise technique, movement at target joints, use of target muscles after mod verbal, visual, tactile cues.  ? ? ? ? ?Response to treatment ?Pt tolerated session well without aggravation of back pain. Improved steadiness with sit <> stand transfer with cues to place and maintain her center of gravity over her base of support.  ? ? ?Clinical impression ?Pt is a 71 year old female who came to physical therapy secondary to low back pain. She also presents with reproduction of symptoms with lumbar AROM, bilateral hip weakness, altered gait pattern and posture, TTP L low back, increased bilateral lumbar paraspinal muscle tension, decreased balance, and difficulty with transfers, picking up items from the ground, as well as prolonged standing secondary to back pain and balance. Pt will benefit from skilled physical therapy services to address the aforementioned deficits.  ? ? ? ? ? ? ? ? ? ? ? ? PT Education - 05/29/21 1948   ? ?  Education Details ther-ex, HEP, POC   ? Person(s) Educated Patient;Spouse   ? Methods Explanation;Demonstration;Tactile cues;Verbal cues   ? Comprehension Verbalized understanding;Returned demonstration   ? ?

## 2021-05-29 NOTE — Patient Instructions (Signed)
Sit <> stand with proper placement of center of gravity over base of support.  ?Pt and husband demonstrated and verbalized understanding.  ?

## 2021-06-01 ENCOUNTER — Ambulatory Visit: Payer: Medicare Other

## 2021-06-07 ENCOUNTER — Ambulatory Visit: Payer: Medicare Other

## 2021-06-07 DIAGNOSIS — Z9181 History of falling: Secondary | ICD-10-CM

## 2021-06-07 DIAGNOSIS — M5459 Other low back pain: Secondary | ICD-10-CM | POA: Diagnosis not present

## 2021-06-07 DIAGNOSIS — R2681 Unsteadiness on feet: Secondary | ICD-10-CM

## 2021-06-07 DIAGNOSIS — M5416 Radiculopathy, lumbar region: Secondary | ICD-10-CM

## 2021-06-07 NOTE — Therapy (Signed)
?OUTPATIENT PHYSICAL THERAPY TREATMENT NOTE ? ? ?Patient Name: Destiny Shaw ?MRN: [id number] ?DOB:[birth year], 71 y.o., female ?Today's Date: 06/07/2021 ? ?PCP: Kirk Ruths, MD ?REFERRING PROVIDER: Suella Broad, MD ? ? PT End of Session - 06/07/21 1150   ? ? Visit Number 2   ? Number of Visits 17   ? Date for PT Re-Evaluation 07/27/21   ? Authorization Type 2   ? Authorization Time Period of 10 progress report   ? PT Start Time 1150   ? PT Stop Time 1233   ? PT Time Calculation (min) 43 min   ? Activity Tolerance Patient tolerated treatment well   ? Behavior During Therapy [facility] for tasks assessed/performed   ? ?  ?  ? ?  ? ? ?Past Medical History:  ?Diagnosis Date  ? ADD (attention deficit disorder)   ? Anemia   ? Anginal pain ([place])   ? pt states has occas chest pain relates to indigestion; pt uses rest to relieve DR [place]-   ? Anxiety   ? Arthritis   ? Concussion   ? Depression   ? Diabetes mellitus without complication ([place])   ? type 2   ? Dizziness   ? Fall   ? GERD (gastroesophageal reflux disease)   ? Headache   ? History of urinary tract infection   ? Hyperlipidemia   ? Hypertension   ? IBS (irritable bowel syndrome)   ? Imbalance   ? Numbness   ? right leg   ? Numbness in both hands   ? comes and goes   ? Pneumonia   ? last episode approx 1 year ago   ? Sleep apnea   ? uses cpap not all the time   ? Wears glasses   ? ?Past Surgical History:  ?Procedure Laterality Date  ? BREAST CYST ASPIRATION Left 1980's  ? neg  ? BREAST CYST EXCISION Right 1980's  ? neg  ? BREAST LUMPECTOMY Right   ? BREAST SURGERY    ? CARPAL TUNNEL RELEASE    ? CESAREAN SECTION    ? times 2  ? COLONOSCOPY WITH PROPOFOL N/A 05/11/2015  ? Procedure: COLONOSCOPY WITH PROPOFOL;  Surgeon: Manya Silvas, MD;  Location: [facility] ENDOSCOPY;  Service: Endoscopy;  Laterality: N/A;  ? DE QUERVAIN'S RELEASE Right   ? ESOPHAGOGASTRODUODENOSCOPY (EGD) WITH PROPOFOL N/A 05/11/2015  ? Procedure: ESOPHAGOGASTRODUODENOSCOPY  (EGD) WITH PROPOFOL;  Surgeon: Manya Silvas, MD;  Location: [facility] ENDOSCOPY;  Service: Endoscopy;  Laterality: N/A;  ? EYE SURGERY    ? laser surgery bilat   ? HERNIA REPAIR    ? KNEE ARTHROSCOPY    ? LUMBAR LAMINECTOMY/DECOMPRESSION MICRODISCECTOMY Bilateral 09/14/2015  ? Procedure: MICRO LUMBAR BILATERAL DECOMPRESSION L4 - L5;  Surgeon: Susa Day, MD;  Location: [facility] ORS;  Service: Orthopedics;  Laterality: Bilateral;  ? REDUCTION MAMMAPLASTY Bilateral 1980  ? RHINOPLASTY    ? TONSILLECTOMY    ? TOTAL KNEE ARTHROPLASTY Left 06/18/2016  ? Procedure: LEFT TOTAL KNEE ARTHROPLASTY;  Surgeon: Paralee Cancel, MD;  Location: [facility] ORS;  Service: Orthopedics;  Laterality: Left;  Adductor Block  ? TOTAL KNEE ARTHROPLASTY Right 02/11/2020  ? Procedure: TOTAL KNEE ARTHROPLASTY;  Surgeon: Paralee Cancel, MD;  Location: [facility] ORS;  Service: Orthopedics;  Laterality: Right;  ? TUBAL LIGATION    ? UVULOPALATOPHARYNGOPLASTY    ? ?Patient Active Problem List  ? Diagnosis Date Noted  ? Osteoarthritis of right knee 02/12/2020  ? S/P total knee  arthroplasty, right 02/11/2020  ? Obese 06/20/2016  ? Status post right knee replacement 06/19/2016  ? S/P left TKA 06/18/2016  ? Spinal stenosis of lumbar region 09/14/2015  ? ? ?REFERRING DIAG: Radiculopathy lumbar region ? ?THERAPY DIAG:  ?Other low back pain ? ?History of falling ? ?Unsteady gait ? ?Radiculopathy, lumbar region ? ?PERTINENT HISTORY: Low back pain. Had limited success with lumbar injections. Temporary reliefe and pain returned a few days. Pt scuffles her feet when she walks. Having trouble with balance and pt bumping into stuff. Has a rolator for balance about a week ago. No radiating symptoms today. Pain is in L low back and L posterior and lateral hip currently. Has bladder incontinence for the past few months. Unsure if she told her doctor about the bladder. Dr. Manuella Ghazi is aware of her bladder challenges. No saddle anesthesia. Does not do as much now as she did before. Has  hydrocephalus. Has an appontment at the end of April about the hydrocephalus.Pertinent History. Low back pain. Had limited success with lumbar injections. Temporary reliefe and pain returned a few days. Pt scuffles her feet when she walks. Having trouble with balance and pt bumping into stuff. Has a rolator for balance about a week ago. No radiating symptoms today. Pain is in L low back and L posterior and lateral hip currently. Has bladder incontinence for the past few months. Unsure if she told her doctor about the bladder. Dr. Manuella Ghazi is aware of her bladder challenges. No saddle anesthesia. Does not do as much now as she did before. Has hydrocephalus. Has an appontment at the end of April about the hydrocephalus.. The comment is Husband is the historian. Taken on 05/29/21 1509 ? ?PRECAUTIONS: Fall risk ? ?SUBJECTIVE: Back is hurting about the same as it usually does. About 5-6/10 currently.  Getting out of bed feels safer.  ? ? ?PAIN:  ?Are you having pain? Yes: NPRS scale: 5-6/10 ?Pain location: low back ? ? ? ? ?TODAY'S TREATMENT:  ?Therapeutic exercise ? ?Sit <> stand from regular chair, emphasis on placing and maintaing her COG over her base of supprt. 5x ?            Improved steadiness with transfer.  ?  ?Standing gentle back extension 5x5 seconds  ? Decreased back pain. Slight dizziness ? ?Sitting with lumbar towel roll. Feels good.  ? ? Then with B scapular retraction 4x5 seconds ? ?Pt states dizziness and room looks "wavy" ? Blood pressure L arm sitting, mechanically taken, normal cuff: 141/67, HR 73 ? SpO2 97 room air,  ? ? Pt was recommended to perform her Epley maneuver at home that she has from previous sessions (pt states she has not been doing the Epley). Pt verbalized understanding.  ?  ? Seated B scapular retraction 10x5 seconds for 2 sets ? ? ?Bent over hip extension over table ? R 5x3 ? L 5x3 ? ? ?Seated manually resisted trunk rotation isometrics  ? L 10x5 seconds to promote more neutral low back  posture ? ?Sit <> L S/L <> supine, emphasis on log roll. L side of bed (since pt sleeps on L side of the bed). 2x ? ? Supine <> L S/L 5x2 log roll  ? ? ?Improved exercise technique, movement at target joints, use of target muscles after mod verbal, visual, tactile cues.  ?  ?  ?  ?  ?Response to treatment ?Decreased L low back and L lateral thigh pain after session.  ? ? ?  ?  Clinical impression ? ?Worked on improving ability to place and maintain her center of gravity over her base of support with transfers for balance, as well as log rolling to promote bed mobility with less back pain. Worked on improving lumbar posture, trunk and glute strength to decrease stress to her low back. Decreased L low back and L lateral thigh pain after session. Pt will benefit from continued skilled physical therapy services to decrease pain, improve strength, balance, and function.  ? ? ?  ? ? ?PATIENT EDUCATION: ?Education details: there-ex ?Person educated: Patient ?Education method: Explanation, Demonstration, Tactile cues, Verbal cues, and Handouts ?Education comprehension: verbalized understanding and returned demonstration ? ? ?HOME EXERCISE PROGRAM: ?Sitting with lumbar towel roll.  ? ? ? ? ? ? PT Short Term Goals - 05/29/21 1938   ? ?  ? PT SHORT TERM GOAL #1  ? Title Pt will be independent with her initial HEP to decrease pain, improve strength, balance, and function.   ? Baseline Pt has started her HEP (05/29/2021)   ? Time 3   ? Period Weeks   ? Status New   ? Target Date 06/22/21   ? ?  ?  ? ?  ? ? ? PT Long Term Goals - 05/29/21 1939   ? ?  ? PT LONG TERM GOAL #1  ? Title Pt will have a decrease in low back pain to 5/10 or less at worst to promote ability to perform standing activities as well as improve ability to pick up items from the floor more comfortably.   ? Baseline 9/10 low back pain at worst for the past 3 months (05/29/2021)   ? Time 8   ? Period Weeks   ? Status New   ? Target Date 07/27/21   ?  ? PT LONG TERM  GOAL #2  ? Title Pt will improve bilateral hip extension and abduction strength by at least 1/2 MMT to promote balance as well as improve ability to perform standing tasks more comfortably for her back.   ? [employer]

## 2021-06-12 ENCOUNTER — Ambulatory Visit: Payer: Medicare Other

## 2021-06-12 DIAGNOSIS — R2681 Unsteadiness on feet: Secondary | ICD-10-CM

## 2021-06-12 DIAGNOSIS — M6281 Muscle weakness (generalized): Secondary | ICD-10-CM

## 2021-06-12 DIAGNOSIS — M5459 Other low back pain: Secondary | ICD-10-CM | POA: Diagnosis not present

## 2021-06-12 DIAGNOSIS — M5416 Radiculopathy, lumbar region: Secondary | ICD-10-CM

## 2021-06-12 DIAGNOSIS — Z9181 History of falling: Secondary | ICD-10-CM

## 2021-06-12 NOTE — Therapy (Signed)
?OUTPATIENT PHYSICAL THERAPY TREATMENT NOTE ? ? ?Patient Name: Destiny Shaw ?MRN: [id number] ?DOB:[birth year], 71 y.o., female ?Today's Date: 06/12/2021 ? ?PCP: Kirk Ruths, MD ?REFERRING PROVIDER: Suella Broad, MD ? ? PT End of Session - 06/12/21 1421   ? ? Visit Number 3   ? Number of Visits 17   ? Date for PT Re-Evaluation 07/27/21   ? Authorization Type 3   ? Authorization Time Period of 10 progress report   ? PT Start Time 7121   ? PT Stop Time 9758   ? PT Time Calculation (min) 46 min   ? Activity Tolerance Patient tolerated treatment well   ? Behavior During Therapy [facility] for tasks assessed/performed   ? ?  ?  ? ?  ? ? ? ?Past Medical History:  ?Diagnosis Date  ? ADD (attention deficit disorder)   ? Anemia   ? Anginal pain ([place])   ? pt states has occas chest pain relates to indigestion; pt uses rest to relieve DR [place]-   ? Anxiety   ? Arthritis   ? Concussion   ? Depression   ? Diabetes mellitus without complication ([place])   ? type 2   ? Dizziness   ? Fall   ? GERD (gastroesophageal reflux disease)   ? Headache   ? History of urinary tract infection   ? Hyperlipidemia   ? Hypertension   ? IBS (irritable bowel syndrome)   ? Imbalance   ? Numbness   ? right leg   ? Numbness in both hands   ? comes and goes   ? Pneumonia   ? last episode approx 1 year ago   ? Sleep apnea   ? uses cpap not all the time   ? Wears glasses   ? ?Past Surgical History:  ?Procedure Laterality Date  ? BREAST CYST ASPIRATION Left 1980's  ? neg  ? BREAST CYST EXCISION Right 1980's  ? neg  ? BREAST LUMPECTOMY Right   ? BREAST SURGERY    ? CARPAL TUNNEL RELEASE    ? CESAREAN SECTION    ? times 2  ? COLONOSCOPY WITH PROPOFOL N/A 05/11/2015  ? Procedure: COLONOSCOPY WITH PROPOFOL;  Surgeon: Manya Silvas, MD;  Location: [facility] ENDOSCOPY;  Service: Endoscopy;  Laterality: N/A;  ? DE QUERVAIN'S RELEASE Right   ? ESOPHAGOGASTRODUODENOSCOPY (EGD) WITH PROPOFOL N/A 05/11/2015  ? Procedure: ESOPHAGOGASTRODUODENOSCOPY  (EGD) WITH PROPOFOL;  Surgeon: Manya Silvas, MD;  Location: [facility] ENDOSCOPY;  Service: Endoscopy;  Laterality: N/A;  ? EYE SURGERY    ? laser surgery bilat   ? HERNIA REPAIR    ? KNEE ARTHROSCOPY    ? LUMBAR LAMINECTOMY/DECOMPRESSION MICRODISCECTOMY Bilateral 09/14/2015  ? Procedure: MICRO LUMBAR BILATERAL DECOMPRESSION L4 - L5;  Surgeon: Susa Day, MD;  Location: [facility] ORS;  Service: Orthopedics;  Laterality: Bilateral;  ? REDUCTION MAMMAPLASTY Bilateral 1980  ? RHINOPLASTY    ? TONSILLECTOMY    ? TOTAL KNEE ARTHROPLASTY Left 06/18/2016  ? Procedure: LEFT TOTAL KNEE ARTHROPLASTY;  Surgeon: Paralee Cancel, MD;  Location: [facility] ORS;  Service: Orthopedics;  Laterality: Left;  Adductor Block  ? TOTAL KNEE ARTHROPLASTY Right 02/11/2020  ? Procedure: TOTAL KNEE ARTHROPLASTY;  Surgeon: Paralee Cancel, MD;  Location: [facility] ORS;  Service: Orthopedics;  Laterality: Right;  ? TUBAL LIGATION    ? UVULOPALATOPHARYNGOPLASTY    ? ?Patient Active Problem List  ? Diagnosis Date Noted  ? Osteoarthritis of right knee 02/12/2020  ? S/P total  knee arthroplasty, right 02/11/2020  ? Obese 06/20/2016  ? Status post right knee replacement 06/19/2016  ? S/P left TKA 06/18/2016  ? Spinal stenosis of lumbar region 09/14/2015  ? ? ?REFERRING DIAG: Radiculopathy lumbar region ? ?THERAPY DIAG:  ?Other low back pain ? ?History of falling ? ?Unsteady gait ? ?Radiculopathy, lumbar region ? ?Muscle weakness (generalized) ? ?PERTINENT HISTORY: Low back pain. Had limited success with lumbar injections. Temporary reliefe and pain returned a few days. Pt scuffles her feet when she walks. Having trouble with balance and pt bumping into stuff. Has a rolator for balance about a week ago. No radiating symptoms today. Pain is in L low back and L posterior and lateral hip currently. Has bladder incontinence for the past few months. Unsure if she told her doctor about the bladder. Dr. Manuella Ghazi is aware of her bladder challenges. No saddle anesthesia. Does not do as much  now as she did before. Has hydrocephalus. Has an appontment at the end of April about the hydrocephalus.Pertinent History. Low back pain. Had limited success with lumbar injections. Temporary reliefe and pain returned a few days. Pt scuffles her feet when she walks. Having trouble with balance and pt bumping into stuff. Has a rolator for balance about a week ago. No radiating symptoms today. Pain is in L low back and L posterior and lateral hip currently. Has bladder incontinence for the past few months. Unsure if she told her doctor about the bladder. Dr. Manuella Ghazi is aware of her bladder challenges. No saddle anesthesia. Does not do as much now as she did before. Has hydrocephalus. Has an appontment at the end of April about the hydrocephalus.. The comment is Husband is the historian. Taken on 05/29/21 1509 ? ?PRECAUTIONS: Fall risk ? ?SUBJECTIVE: Back is doing well except for low back/L posterior hip pain when standing for too long such as cleaning.  ? ? ? ?PAIN:  ?Are you having pain? Yes: NPRS scale: 4-5/10 ?Pain location: low back ? ? ? ? ?TODAY'S TREATMENT:  ?Therapeutic exercise ? ?Sit <> stand from regular chair, emphasis on placing and maintaing her COG over her base of supprt. 5x2 ?            Improved steadiness with transfer.  ? ?Sitting with lumbar towel roll.  ? with B scapular retraction 10x5 seconds for 3 sets ? ? With gentle back extension 2x5 seconds. Low back discomfort. Eases with rest.  ? ?Bent over hip extension over table ? R 5x4 ? L 5x4 ? ?Seated manually resisted R lateral shift to counter L lateral shift posture today. 10x5 seconds  ? Pt states feeling a headache afterwards ? Blood pressure L arm sitting, mechanically taken, normal cuff: 153/77, HR  71 ?  ? Headache eases with rest  ? ?Standing hip abduction with B UE assist  ? R 10x ? L 10x ? Pt shaking. When asked, pt states that she ate breakfast but skipped lunch because she is going out for dinner later. Pt states having diabetes. No food  allergies reported. Half a bag of [employer] berry fruit gummies provided to her. Time taken for pt to rest and eat.  ? Pt was recommended to make sure she eats before PT. Pt verbalized understanding. ? Pt reports that her kids usually ask her if she has eaten.  ? ? ? ?Improved exercise technique, movement at target joints, use of target muscles after mod verbal, visual, tactile cues.  ?  ? ? ? ? Manual therapy: ?  Seated STM upper cervical paraspinal muscles to help decrease tension and hopefully headache ? Decreased headache afterwards. Decreased neck tension reported.  ? ? ?  ?Response to treatment ?Decreased headache and no low back pain in sitting reported after treatment.  ? ? ? ?  ?Clinical impression ? ?Worked on improving glute and trunk strength, as well as posture to decrease stress to low back. Reinforced proper sit <> stand technique so as to not lose balance backwards. Improved steadiness with transfer observed afterwards. Decreased headache after decreasing muscle tension to greater occipital nerve. Pt was observed shaking after performing her last exercise. Pt states having diabetes. Pt education on needing to eat and not skip meals prior to PT. Half a bag of gummies provided for pt. Pt verbalized understanding. No back pain reported in sitting after treatment. Pt will benefit from continued skilled physical therapy services to decrease pain, improve strength, balance, and function.  ? ? ?  ? ? ?PATIENT EDUCATION: ?Education details: there-ex ?Person educated: Patient ?Education method: Explanation, Demonstration, Tactile cues, Verbal cues, and Handouts ?Education comprehension: verbalized understanding and returned demonstration ? ? ?HOME EXERCISE PROGRAM: ? ?Access Code: [email or web address] ?URL: [email or web address] ?Date: 06/12/2021 ?Prepared by: Joneen Boers ? ?Exercises ?- Proper Sit to Stand Technique  - 2 x daily - 7 x weekly - 2 sets - 5 reps ? ?Sitting with lumbar towel roll.   ? ? ? ? ? ? ? ? ? PT Short Term Goals - 05/29/21 1938   ? ?  ? PT SHORT TERM GOAL #1  ? Title Pt will be independent with her initial HEP to decrease pain, improve strength, balance, and function.   ? Baseline Pt ha

## 2021-06-15 ENCOUNTER — Ambulatory Visit: Payer: Medicare Other

## 2021-06-15 DIAGNOSIS — Z9181 History of falling: Secondary | ICD-10-CM

## 2021-06-15 DIAGNOSIS — M5459 Other low back pain: Secondary | ICD-10-CM | POA: Diagnosis not present

## 2021-06-15 NOTE — Therapy (Signed)
?OUTPATIENT PHYSICAL THERAPY TREATMENT NOTE ? ? ?Patient Name: Destiny Shaw ?MRN: [id number] ?DOB:1950-10-08, 71 y.o., female ?Today's Date: 06/15/2021 ? ?PCP: Kirk Ruths, MD ?REFERRING PROVIDER: Suella Broad, MD ? ? PT End of Session - 06/15/21 1549   ? ? Visit Number 4   ? Number of Visits 17   ? Date for PT Re-Evaluation 07/27/21   ? Authorization Type 4   ? Authorization Time Period of 10 progress report   ? PT Start Time 2633   ? PT Stop Time 1628   ? PT Time Calculation (min) 39 min   ? Activity Tolerance Patient tolerated treatment well   ? Behavior During Therapy [facility] for tasks assessed/performed   ? ?  ?  ? ?  ? ? ? ? ?Past Medical History:  ?Diagnosis Date  ? ADD (attention deficit disorder)   ? Anemia   ? Anginal pain ([place])   ? pt states has occas chest pain relates to indigestion; pt uses rest to relieve DR [place]-   ? Anxiety   ? Arthritis   ? Concussion   ? Depression   ? Diabetes mellitus without complication ([place])   ? type 2   ? Dizziness   ? Fall   ? GERD (gastroesophageal reflux disease)   ? Headache   ? History of urinary tract infection   ? Hyperlipidemia   ? Hypertension   ? IBS (irritable bowel syndrome)   ? Imbalance   ? Numbness   ? right leg   ? Numbness in both hands   ? comes and goes   ? Pneumonia   ? last episode approx 1 year ago   ? Sleep apnea   ? uses cpap not all the time   ? Wears glasses   ? ?Past Surgical History:  ?Procedure Laterality Date  ? BREAST CYST ASPIRATION Left 1980's  ? neg  ? BREAST CYST EXCISION Right 1980's  ? neg  ? BREAST LUMPECTOMY Right   ? BREAST SURGERY    ? CARPAL TUNNEL RELEASE    ? CESAREAN SECTION    ? times 2  ? COLONOSCOPY WITH PROPOFOL N/A 05/11/2015  ? Procedure: COLONOSCOPY WITH PROPOFOL;  Surgeon: Manya Silvas, MD;  Location: [facility] ENDOSCOPY;  Service: Endoscopy;  Laterality: N/A;  ? DE QUERVAIN'S RELEASE Right   ? ESOPHAGOGASTRODUODENOSCOPY (EGD) WITH PROPOFOL N/A 05/11/2015  ? Procedure: ESOPHAGOGASTRODUODENOSCOPY  (EGD) WITH PROPOFOL;  Surgeon: Manya Silvas, MD;  Location: [facility] ENDOSCOPY;  Service: Endoscopy;  Laterality: N/A;  ? EYE SURGERY    ? laser surgery bilat   ? HERNIA REPAIR    ? KNEE ARTHROSCOPY    ? LUMBAR LAMINECTOMY/DECOMPRESSION MICRODISCECTOMY Bilateral 09/14/2015  ? Procedure: MICRO LUMBAR BILATERAL DECOMPRESSION L4 - L5;  Surgeon: Susa Day, MD;  Location: [facility] ORS;  Service: Orthopedics;  Laterality: Bilateral;  ? REDUCTION MAMMAPLASTY Bilateral 1980  ? RHINOPLASTY    ? TONSILLECTOMY    ? TOTAL KNEE ARTHROPLASTY Left 06/18/2016  ? Procedure: LEFT TOTAL KNEE ARTHROPLASTY;  Surgeon: Paralee Cancel, MD;  Location: [facility] ORS;  Service: Orthopedics;  Laterality: Left;  Adductor Block  ? TOTAL KNEE ARTHROPLASTY Right 02/11/2020  ? Procedure: TOTAL KNEE ARTHROPLASTY;  Surgeon: Paralee Cancel, MD;  Location: [facility] ORS;  Service: Orthopedics;  Laterality: Right;  ? TUBAL LIGATION    ? UVULOPALATOPHARYNGOPLASTY    ? ?Patient Active Problem List  ? Diagnosis Date Noted  ? Osteoarthritis of right knee 02/12/2020  ? S/P  total knee arthroplasty, right 02/11/2020  ? Obese 06/20/2016  ? Status post right knee replacement 06/19/2016  ? S/P left TKA 06/18/2016  ? Spinal stenosis of lumbar region 09/14/2015  ? ? ?REFERRING DIAG: Radiculopathy lumbar region ? ?THERAPY DIAG:  ?Other low back pain ? ?History of falling ? ?PERTINENT HISTORY: Low back pain. Had limited success with lumbar injections. Temporary reliefe and pain returned a few days. Pt scuffles her feet when she walks. Having trouble with balance and pt bumping into stuff. Has a rolator for balance about a week ago. No radiating symptoms today. Pain is in L low back and L posterior and lateral hip currently. Has bladder incontinence for the past few months. Unsure if she told her doctor about the bladder. Dr. Manuella Ghazi is aware of her bladder challenges. No saddle anesthesia. Does not do as much now as she did before. Has hydrocephalus. Has an appontment at the end of April  about the hydrocephalus.Pertinent History. Low back pain. Had limited success with lumbar injections. Temporary reliefe and pain returned a few days. Pt scuffles her feet when she walks. Having trouble with balance and pt bumping into stuff. Has a rolator for balance about a week ago. No radiating symptoms today. Pain is in L low back and L posterior and lateral hip currently. Has bladder incontinence for the past few months. Unsure if she told her doctor about the bladder. Dr. Manuella Ghazi is aware of her bladder challenges. No saddle anesthesia. Does not do as much now as she did before. Has hydrocephalus. Has an appontment at the end of April about the hydrocephalus.. The comment is Husband is the historian. Taken on 05/29/21 1509 ? ?PRECAUTIONS: Fall risk ? ?SUBJECTIVE: Had low back pain yesterday (horizontal low back). No low back pain currently.  ? ? ? ? ?PAIN:  ?Are you having pain? Yes: NPRS scale: 0/10 ?Pain location: low back ? ? ? ? ?TODAY'S TREATMENT:  ?Therapeutic exercise ? ?Sit <> stand from regular chair, emphasis on placing and maintaing her COG over her base of supprt. 5x3 ?            Improved steadiness with transfer.  ? ? ?Standing hip abduction with B UE assist  ? R 10x2 ? L 10x2 ?  ?Bent over hip extension over table ? R 5x4 ? L 5x4 ? ?Hooklying posterior pelvic tilt 10x5 seconds for 2 sets, then with legs straight (full supine) 10x5 seconds  ? Then with marches  ? ?Hooklying lower trunk rotation  ? 10x3 each side ? ?R S/L to sit. 3x Difficult, needing PT min A. Mod cues for proper hand and LE placement ? ?  ? ?Improved exercise technique, movement at target joints, use of target muscles after mod verbal, visual, tactile cues.  ?  ? ? ?  ?Response to treatment ?Pt tolerated session well without aggravation of symptoms.  ? ? ? ?  ?Clinical impression ?Worked on trunk mobility, trunk and glute strength to decrease stress to low back. Continued working on proper way of performing transfers to decrease fall  risk with sit <> stands and decrease back pain with supine <> sit. Pt tolerated session well without aggravation of symptoms. Challenges to progress include memory difficulties. Pt will benefit from continued skilled physical therapy services to decrease pain, improve strength, balance, and function.  ? ? ?  ? ? ?PATIENT EDUCATION: ?Education details: there-ex ?Person educated: Patient ?Education method: Explanation, Demonstration, Tactile cues, Verbal cues, and Handouts ?Education comprehension: verbalized understanding  and returned demonstration ? ? ?HOME EXERCISE PROGRAM: ? ?Access Code: [email or web address] ?URL: [email or web address] ?Date: 06/12/2021 ?Prepared by: Joneen Boers ? ?Exercises ?- Proper Sit to Stand Technique  - 2 x daily - 7 x weekly - 2 sets - 5 reps ? ?Sitting with lumbar towel roll.  ? ?- Standing Hip Abduction with Counter Support  - 1 x daily - 7 x weekly - 2 sets - 10 reps ?- Supine Lower Trunk Rotation  - 1 x daily - 7 x weekly - 3 sets - 10 reps ? ? ? ? ? ? ? ? PT Short Term Goals - 05/29/21 1938   ? ?  ? PT SHORT TERM GOAL #1  ? Title Pt will be independent with her initial HEP to decrease pain, improve strength, balance, and function.   ? Baseline Pt has started her HEP (05/29/2021)   ? Time 3   ? Period Weeks   ? Status New   ? Target Date 06/22/21   ? ?  ?  ? ?  ? ? ? PT Long Term Goals - 05/29/21 1939   ? ?  ? PT LONG TERM GOAL #1  ? Title Pt will have a decrease in low back pain to 5/10 or less at worst to promote ability to perform standing activities as well as improve ability to pick up items from the floor more comfortably.   ? Baseline 9/10 low back pain at worst for the past 3 months (05/29/2021)   ? Time 8   ? Period Weeks   ? Status New   ? Target Date 07/27/21   ?  ? PT LONG TERM GOAL #2  ? Title Pt will improve bilateral hip extension and abduction strength by at least 1/2 MMT to promote balance as well as improve ability to perform standing tasks more comfortably for her  back.   ? Baseline Hip extension 4/5 R, 4-/5 L, hip abduction 4/5 R, 4-/5 L (05/29/2021)   ? Time 8   ? Period Weeks   ? Status New   ? Target Date 07/27/21   ?  ? PT LONG TERM GOAL #3  ? Title Pt will im

## 2021-06-20 ENCOUNTER — Ambulatory Visit: Payer: Medicare Other

## 2021-06-20 DIAGNOSIS — M5459 Other low back pain: Secondary | ICD-10-CM | POA: Diagnosis not present

## 2021-06-20 DIAGNOSIS — M5416 Radiculopathy, lumbar region: Secondary | ICD-10-CM

## 2021-06-20 DIAGNOSIS — Z9181 History of falling: Secondary | ICD-10-CM

## 2021-06-20 DIAGNOSIS — R2681 Unsteadiness on feet: Secondary | ICD-10-CM

## 2021-06-20 NOTE — Therapy (Signed)
?OUTPATIENT PHYSICAL THERAPY TREATMENT NOTE ? ? ?Patient Name: Destiny Shaw ?MRN: [id number] ?DOB:1950/12/02, 71 y.o., female ?Today's Date: 06/20/2021 ? ?PCP: Kirk Ruths, MD ?REFERRING PROVIDER: Suella Broad, MD ? ? PT End of Session - 06/20/21 1332   ? ? Visit Number 5   ? Number of Visits 17   ? Date for PT Re-Evaluation 07/27/21   ? Authorization Type 5   ? Authorization Time Period of 10 progress report   ? PT Start Time 1332   ? PT Stop Time 1414   ? PT Time Calculation (min) 42 min   ? Activity Tolerance Patient tolerated treatment well   ? Behavior During Therapy [facility] for tasks assessed/performed   ? ?  ?  ? ?  ? ? ? ? ? ?Past Medical History:  ?Diagnosis Date  ? ADD (attention deficit disorder)   ? Anemia   ? Anginal pain ([place])   ? pt states has occas chest pain relates to indigestion; pt uses rest to relieve DR [place]-   ? Anxiety   ? Arthritis   ? Concussion   ? Depression   ? Diabetes mellitus without complication ([place])   ? type 2   ? Dizziness   ? Fall   ? GERD (gastroesophageal reflux disease)   ? Headache   ? History of urinary tract infection   ? Hyperlipidemia   ? Hypertension   ? IBS (irritable bowel syndrome)   ? Imbalance   ? Numbness   ? right leg   ? Numbness in both hands   ? comes and goes   ? Pneumonia   ? last episode approx 1 year ago   ? Sleep apnea   ? uses cpap not all the time   ? Wears glasses   ? ?Past Surgical History:  ?Procedure Laterality Date  ? BREAST CYST ASPIRATION Left 1980's  ? neg  ? BREAST CYST EXCISION Right 1980's  ? neg  ? BREAST LUMPECTOMY Right   ? BREAST SURGERY    ? CARPAL TUNNEL RELEASE    ? CESAREAN SECTION    ? times 2  ? COLONOSCOPY WITH PROPOFOL N/A 05/11/2015  ? Procedure: COLONOSCOPY WITH PROPOFOL;  Surgeon: Manya Silvas, MD;  Location: [facility] ENDOSCOPY;  Service: Endoscopy;  Laterality: N/A;  ? DE QUERVAIN'S RELEASE Right   ? ESOPHAGOGASTRODUODENOSCOPY (EGD) WITH PROPOFOL N/A 05/11/2015  ? Procedure:  ESOPHAGOGASTRODUODENOSCOPY (EGD) WITH PROPOFOL;  Surgeon: Manya Silvas, MD;  Location[facility] ENDOSCOPY;  Service: Endoscopy;  Laterality: N/A;  ? EYE SURGERY    ? laser surgery bilat   ? HERNIA REPAIR    ? KNEE ARTHROSCOPY    ? LUMBAR LAMINECTOMY/DECOMPRESSION MICRODISCECTOMY Bilateral 09/14/2015  ? Procedure: MICRO LUMBAR BILATERAL DECOMPRESSION L4 - L5;  Surgeon: Susa Day, MD;  Location: [facility] ORS;  Service: Orthopedics;  Laterality: Bilateral;  ? REDUCTION MAMMAPLASTY Bilateral 1980  ? RHINOPLASTY    ? TONSILLECTOMY    ? TOTAL KNEE ARTHROPLASTY Left 06/18/2016  ? Procedure: LEFT TOTAL KNEE ARTHROPLASTY;  Surgeon: Paralee Cancel, MD;  Location: [facility];  Service: Orthopedics;  Laterality: Left;  Adductor Block  ? TOTAL KNEE ARTHROPLASTY Right 02/11/2020  ? Procedure: TOTAL KNEE ARTHROPLASTY;  Surgeon: Paralee Cancel, MD;  Location: [facility];  Service: Orthopedics;  Laterality: Right;  ? TUBAL LIGATION    ? UVULOPALATOPHARYNGOPLASTY    ? ?Patient Active Problem List  ? Diagnosis Date Noted  ? Osteoarthritis of right knee 02/12/2020  ?  S/P total knee arthroplasty, right 02/11/2020  ? Obese 06/20/2016  ? Status post right knee replacement 06/19/2016  ? S/P left TKA 06/18/2016  ? Spinal stenosis of lumbar region 09/14/2015  ? ? ?REFERRING DIAG: Radiculopathy lumbar region ? ?THERAPY DIAG:  ?Other low back pain ? ?History of falling ? ?Unsteady gait ? ?Radiculopathy, lumbar region ? ?PERTINENT HISTORY: Low back pain. Had limited success with lumbar injections. Temporary reliefe and pain returned a few days. Pt scuffles her feet when she walks. Having trouble with balance and pt bumping into stuff. Has a rolator for balance about a week ago. No radiating symptoms today. Pain is in L low back and L posterior and lateral hip currently. Has bladder incontinence for the past few months. Unsure if she told her doctor about the bladder. Dr. Manuella Ghazi is aware of her bladder challenges. No saddle anesthesia. Does not do as much now as  she did before. Has hydrocephalus. Has an appontment at the end of April about the hydrocephalus.Pertinent History. Low back pain. Had limited success with lumbar injections. Temporary reliefe and pain returned a few days. Pt scuffles her feet when she walks. Having trouble with balance and pt bumping into stuff. Has a rolator for balance about a week ago. No radiating symptoms today. Pain is in L low back and L posterior and lateral hip currently. Has bladder incontinence for the past few months. Unsure if she told her doctor about the bladder. Dr. Manuella Ghazi is aware of her bladder challenges. No saddle anesthesia. Does not do as much now as she did before. Has hydrocephalus. Has an appontment at the end of April about the hydrocephalus.. The comment is Husband is the historian. Taken on 05/29/21 1509 ? ?PRECAUTIONS: Fall risk ? ?SUBJECTIVE: Back has been hurting her on lower back. Doing better with the log roll as well as getting in and out of bed. Has not fallen at all. Had some improvements. Was pretty bad this morning but doing ok now. It comes and goes.  ? ? ? ?PAIN:  ?Are you having pain? Yes: NPRS scale: 0/10 ?Pain location: low back ? ?Latex allergy.  ? ? ?TODAY'S TREATMENT:  ?Therapeutic exercise ? ?Hooklying lower trunk rotation  ? 10x3 each side ? ?Hooklying posterior pelvic tilt 10x5 seconds for 3 sets,  ?  ? Then with marches 8x each LE ?  Low back pain, ease with legs straight in supine.  ? ?Supine glute set 10x5 seconds  ?  ?Hooklying clam shell red band 10x3 ? ?Hooklying hip extension isometrics leg straight  ? R 10x5 seconds for 3 sets ? L 10x5 seconds for 3 sets ? ? ?Bent over hip extension over table ? R 10x2 ? L 10x2 ? ? ?Standing hip abduction with B UE assist  ? R 10x2 ? L 10x2 ?  ? ? ? ?Improved exercise technique, movement at target joints, use of target muscles after mod verbal, visual, tactile cues.  ?  ? ? ?  ?Response to treatment ?Pt tolerated session well without aggravation of symptoms. Back  feels better per pt after session.  ? ? ? ? ? ?  ?Clinical impression ?Improved safety with sit <> stand observed. Improved log roll for supine to sit observed. Continued working on trunk mobility, trunk and glute strength to decrease stress to low back. Pt tolerated session well without aggravation of symptoms. Pt will benefit from continued skilled physical therapy services to decrease pain, improve strength, balance, and function.  ? ? ?  ? ? ?  PATIENT EDUCATION: ?Education details: there-ex ?Person educated: Patient ?Education method: Explanation, Demonstration, Tactile cues, Verbal cues, and Handouts ?Education comprehension: verbalized understanding and returned demonstration ? ? ?HOME EXERCISE PROGRAM: ? ?Access Code: [email or web address] ?URL: [email or web address] ?Date: 06/12/2021 ?Prepared by: Joneen Boers ? ?Exercises ?- Proper Sit to Stand Technique  - 2 x daily - 7 x weekly - 2 sets - 5 reps ? ?Sitting with lumbar towel roll.  ? ?- Standing Hip Abduction with Counter Support  - 1 x daily - 7 x weekly - 2 sets - 10 reps ?- Supine Lower Trunk Rotation  - 1 x daily - 7 x weekly - 3 sets - 10 reps ? ?- Hooklying Clamshell with Resistance  - 1 x daily - 7 x weekly - 3 sets - 10 reps. RED band ? ? ? ? ? ? PT Short Term Goals - 05/29/21 1938   ? ?  ? PT SHORT TERM GOAL #1  ? Title Pt will be independent with her initial HEP to decrease pain, improve strength, balance, and function.   ? Baseline Pt has started her HEP (05/29/2021)   ? Time 3   ? Period Weeks   ? Status New   ? Target Date 06/22/21   ? ?  ?  ? ?  ? ? ? PT Long Term Goals - 05/29/21 1939   ? ?  ? PT LONG TERM GOAL #1  ? Title Pt will have a decrease in low back pain to 5/10 or less at worst to promote ability to perform standing activities as well as improve ability to pick up items from the floor more comfortably.   ? Baseline 9/10 low back pain at worst for the past 3 months (05/29/2021)   ? Time 8   ? Period Weeks   ? Status New   ? Target Date  07/27/21   ?  ? PT LONG TERM GOAL #2  ? Title Pt will improve bilateral hip extension and abduction strength by at least 1/2 MMT to promote balance as well as improve ability to perform standing tasks more comfortably for

## 2021-06-22 ENCOUNTER — Ambulatory Visit: Payer: Medicare Other

## 2021-06-27 ENCOUNTER — Ambulatory Visit: Payer: Medicare Other | Attending: Physical Medicine and Rehabilitation

## 2021-06-27 DIAGNOSIS — R2681 Unsteadiness on feet: Secondary | ICD-10-CM | POA: Diagnosis present

## 2021-06-27 DIAGNOSIS — M5416 Radiculopathy, lumbar region: Secondary | ICD-10-CM | POA: Insufficient documentation

## 2021-06-27 DIAGNOSIS — Z9181 History of falling: Secondary | ICD-10-CM | POA: Diagnosis present

## 2021-06-27 DIAGNOSIS — M5459 Other low back pain: Secondary | ICD-10-CM | POA: Diagnosis present

## 2021-06-27 DIAGNOSIS — M6281 Muscle weakness (generalized): Secondary | ICD-10-CM | POA: Insufficient documentation

## 2021-06-27 NOTE — Therapy (Signed)
?OUTPATIENT PHYSICAL THERAPY TREATMENT NOTE ? ? ?Patient Name: Destiny Shaw ?MRN: [id number] ?DOB:1950/08/19, 71 y.o., female ?Today's Date: 06/27/2021 ? ?PCP: Kirk Ruths, MD ?REFERRING PROVIDER: Suella Broad, MD ? ? PT End of Session - 06/27/21 1335   ? ? Visit Number 6   ? Number of Visits 17   ? Date for PT Re-Evaluation 07/27/21   ? Authorization Type 6   ? Authorization Time Period of 10 progress report   ? PT Start Time 1336   ? PT Stop Time 1416   ? PT Time Calculation (min) 40 min   ? Activity Tolerance Patient tolerated treatment well   ? Behavior During Therapy [facility] for tasks assessed/performed   ? ?  ?  ? ?  ? ? ? ? ? ? ?Past Medical History:  ?Diagnosis Date  ? ADD (attention deficit disorder)   ? Anemia   ? Anginal pain ([place])   ? pt states has occas chest pain relates to indigestion; pt uses rest to relieve DR [place]-   ? Anxiety   ? Arthritis   ? Concussion   ? Depression   ? Diabetes mellitus without complication ([place])   ? type 2   ? Dizziness   ? Fall   ? GERD (gastroesophageal reflux disease)   ? Headache   ? History of urinary tract infection   ? Hyperlipidemia   ? Hypertension   ? IBS (irritable bowel syndrome)   ? Imbalance   ? Numbness   ? right leg   ? Numbness in both hands   ? comes and goes   ? Pneumonia   ? last episode approx 1 year ago   ? Sleep apnea   ? uses cpap not all the time   ? Wears glasses   ? ?Past Surgical History:  ?Procedure Laterality Date  ? BREAST CYST ASPIRATION Left 1980's  ? neg  ? BREAST CYST EXCISION Right 1980's  ? neg  ? BREAST LUMPECTOMY Right   ? BREAST SURGERY    ? CARPAL TUNNEL RELEASE    ? CESAREAN SECTION    ? times 2  ? COLONOSCOPY WITH PROPOFOL N/A 05/11/2015  ? Procedure: COLONOSCOPY WITH PROPOFOL;  Surgeon: Manya Silvas, MD;  Location: [facility] ENDOSCOPY;  Service: Endoscopy;  Laterality: N/A;  ? DE QUERVAIN'S RELEASE Right   ? ESOPHAGOGASTRODUODENOSCOPY (EGD) WITH PROPOFOL N/A 05/11/2015  ? Procedure:  ESOPHAGOGASTRODUODENOSCOPY (EGD) WITH PROPOFOL;  Surgeon: Manya Silvas, MD;  Location: [facility] ENDOSCOPY;  Service: Endoscopy;  Laterality: N/A;  ? EYE SURGERY    ? laser surgery bilat   ? HERNIA REPAIR    ? KNEE ARTHROSCOPY    ? LUMBAR LAMINECTOMY/DECOMPRESSION MICRODISCECTOMY Bilateral 09/14/2015  ? Procedure: MICRO LUMBAR BILATERAL DECOMPRESSION L4 - L5;  Surgeon: Susa Day, MD;  Location: [facility] ORS;  Service: Orthopedics;  Laterality: Bilateral;  ? REDUCTION MAMMAPLASTY Bilateral 1980  ? RHINOPLASTY    ? TONSILLECTOMY    ? TOTAL KNEE ARTHROPLASTY Left 06/18/2016  ? Procedure: LEFT TOTAL KNEE ARTHROPLASTY;  Surgeon: Paralee Cancel, MD;  Location: [facility] OR[facility];  Service: Orthopedics;  Laterality: Left;  Adductor Block  ? TOTAL KNEE ARTHROPLASTY Right 02/11/2020  ? Procedure: TOTAL KNEE ARTHROPLASTY;  Surgeon: Paralee Cancel, MD;  Location: [facility]S;  Service: Orthopedics;  Laterality: Right;  ? TUBAL LIGATION    ? UVULOPALATOPHARYNGOPLASTY    ? ?Patient Active Problem List  ? Diagnosis Date Noted  ? Osteoarthritis of right knee 02/12/2020  ?  S/P total knee arthroplasty, right 02/11/2020  ? Obese 06/20/2016  ? Status post right knee replacement 06/19/2016  ? S/P left TKA 06/18/2016  ? Spinal stenosis of lumbar region 09/14/2015  ? ? ?REFERRING DIAG: Radiculopathy lumbar region ? ?THERAPY DIAG:  ?Other low back pain ? ?History of falling ? ?Unsteady gait ? ?PERTINENT HISTORY: Low back pain. Had limited success with lumbar injections. Temporary reliefe and pain returned a few days. Pt scuffles her feet when she walks. Having trouble with balance and pt bumping into stuff. Has a rolator for balance about a week ago. No radiating symptoms today. Pain is in L low back and L posterior and lateral hip currently. Has bladder incontinence for the past few months. Unsure if she told her doctor about the bladder. Dr. Manuella Ghazi is aware of her bladder challenges. No saddle anesthesia. Does not do as much now as she did before. Has  hydrocephalus. Has an appontment at the end of April about the hydrocephalus.Pertinent History. Low back pain. Had limited success with lumbar injections. Temporary reliefe and pain returned a few days. Pt scuffles her feet when she walks. Having trouble with balance and pt bumping into stuff. Has a rolator for balance about a week ago. No radiating symptoms today. Pain is in L low back and L posterior and lateral hip currently. Has bladder incontinence for the past few months. Unsure if she told her doctor about the bladder. Dr. Manuella Ghazi is aware of her bladder challenges. No saddle anesthesia. Does not do as much now as she did before. Has hydrocephalus. Has an appontment at the end of April about the hydrocephalus.. The comment is Husband is the historian. Taken on 05/29/21 1509 ? ?PRECAUTIONS: Fall risk ? ?SUBJECTIVE: Back is a little better. No pain currently. Doing better with getting up from her bed in the morning.  ? ? ? ? ?PAIN:  ?Are you having pain? Yes: NPRS scale: 0/10 ?Pain location: low back ? ?Latex allergy.  ? ? ?TODAY'S TREATMENT:  ?Therapeutic exercise ? ?Bent over hip extension over table ? R 10x3 ? L 10x3 ? ?Standing hip abduction with B UE assist  ? R 10x2 ? L 10x2 ? ?Hooklying lower trunk rotation  ? 10x3 each side ? ?Hooklying posterior pelvic tilt 10x5 seconds for 3 sets,  ?  ?Hooklying clam shell red band 10x3 ? ?Hooklying hip adduction isometrics small green ball squeeze 10x5 seconds for 2 sets  ? ?Hooklying hip extension isometrics leg straight  ? R 10x5 seconds for 3 sets ? L 10x5 seconds for 3 sets ? ? ?  ? ?Improved exercise technique, movement at target joints, use of target muscles after mod verbal, visual, tactile cues.  ?  ? ? ?  ?Response to treatment ?Fair tolerance to today's session ? ? ? ? ? ?  ?Clinical impression ?Improved ability to perform sit <> stand transfers properly and safely. Continued working on trunk mobility, trunk and glute strength to decrease stress to low back.  Fair tolerance to today's session. Pt will benefit from continued skilled physical therapy services to decrease pain, improve strength, balance, and function.  ? ? ?  ? ? ?PATIENT EDUCATION: ?Education details: there-ex ?Person educated: Patient ?Education method: Explanation, Demonstration, Tactile cues, Verbal cues, and Handouts ?Education comprehension: verbalized understanding and returned demonstration ? ? ?HOME EXERCISE PROGRAM: ? ?Access Code: [email or web address] ?URL: [email or web address] ?Date: 06/12/2021 ?Prepared by: Joneen Boers ? ?Exercises ?- Proper Sit to Stand Technique  - 2 x daily - 7 x  weekly - 2 sets - 5 reps ? ?Sitting with lumbar towel roll.  ? ?- Standing Hip Abduction with Counter Support  - 1 x daily - 7 x weekly - 2 sets - 10 reps ?- Supine Lower Trunk Rotation  - 1 x daily - 7 x weekly - 3 sets - 10 reps ? ?- Hooklying Clamshell with Resistance  - 1 x daily - 7 x weekly - 3 sets - 10 reps. RED band ? ? ? ? ? ? PT Short Term Goals - 05/29/21 1938   ? ?  ? PT SHORT TERM GOAL #1  ? Title Pt will be independent with her initial HEP to decrease pain, improve strength, balance, and function.   ? Baseline Pt has started her HEP (05/29/2021)   ? Time 3   ? Period Weeks   ? Status New   ? Target Date 06/22/21   ? ?  ?  ? ?  ? ? ? PT Long Term Goals - 05/29/21 1939   ? ?  ? PT LONG TERM GOAL #1  ? Title Pt will have a decrease in low back pain to 5/10 or less at worst to promote ability to perform standing activities as well as improve ability to pick up items from the floor more comfortably.   ? Baseline 9/10 low back pain at worst for the past 3 months (05/29/2021)   ? Time 8   ? Period Weeks   ? Status New   ? Target Date 07/27/21   ?  ? PT LONG TERM GOAL #2  ? Title Pt will improve bilateral hip extension and abduction strength by at least 1/2 MMT to promote balance as well as improve ability to perform standing tasks more comfortably for her back.   ? Baseline Hip extension 4/5 R, 4-/5 L, hip  abduction 4/5 R, 4-/5 L (05/29/2021)   ? Time 8   ? Period Weeks   ? Status New   ? Target Date 07/27/21   ?  ? PT LONG TERM GOAL #3  ? Title Pt will improve her lumbar FOTO score by at least 10 points as a demonstration of improved functio

## 2021-06-29 ENCOUNTER — Ambulatory Visit: Payer: Medicare Other

## 2021-06-29 DIAGNOSIS — M5459 Other low back pain: Secondary | ICD-10-CM

## 2021-06-29 DIAGNOSIS — R2681 Unsteadiness on feet: Secondary | ICD-10-CM

## 2021-06-29 DIAGNOSIS — Z9181 History of falling: Secondary | ICD-10-CM

## 2021-06-29 NOTE — Therapy (Signed)
?OUTPATIENT PHYSICAL THERAPY TREATMENT NOTE ? ? ?Patient Name: Destiny Shaw ?MRN: [id number] ?DOB:Aug 31, 1950, 71 y.o., female ?Today's Date: 06/29/2021 ? ?PCP: Kirk Ruths, MD ?REFERRING PROVIDER: Suella Broad, MD ? ? PT End of Session - 06/29/21 1420   ? ? Visit Number 7   ? Number of Visits 17   ? Date for PT Re-Evaluation 07/27/21   ? Authorization Type 7   ? Authorization Time Period of 10 progress report   ? PT Start Time 1420   ? PT Stop Time 1500   ? PT Time Calculation (min) 40 min   ? Activity Tolerance Patient tolerated treatment well   ? Behavior During Therapy [facility] for tasks assessed/performed   ? ?  ?  ? ?  ? ? ? ? ? ? ? ?Past Medical History:  ?Diagnosis Date  ? ADD (attention deficit disorder)   ? Anemia   ? Anginal pain ([place])   ? pt states has occas chest pain relates to indigestion; pt uses rest to relieve DR [place]-   ? Anxiety   ? Arthritis   ? Concussion   ? Depression   ? Diabetes mellitus without complication ([place])   ? type 2   ? Dizziness   ? Fall   ? GERD (gastroesophageal reflux disease)   ? Headache   ? History of urinary tract infection   ? Hyperlipidemia   ? Hypertension   ? IBS (irritable bowel syndrome)   ? Imbalance   ? Numbness   ? right leg   ? Numbness in both hands   ? comes and goes   ? Pneumonia   ? last episode approx 1 year ago   ? Sleep apnea   ? uses cpap not all the time   ? Wears glasses   ? ?Past Surgical History:  ?Procedure Laterality Date  ? BREAST CYST ASPIRATION Left 1980's  ? neg  ? BREAST CYST EXCISION Right 1980's  ? neg  ? BREAST LUMPECTOMY Right   ? BREAST SURGERY    ? CARPAL TUNNEL RELEASE    ? CESAREAN SECTION    ? times 2  ? COLONOSCOPY WITH PROPOFOL N/A 05/11/2015  ? Procedure: COLONOSCOPY WITH PROPOFOL;  Surgeon: Manya Silvas, MD;  Location: [facility] ENDOSCOPY;  Service: Endoscopy;  Laterality: N/A;  ? DE QUERVAIN'S RELEASE Right   ? ESOPHAGOGASTRODUODENOSCOPY (EGD) WITH PROPOFOL N/A 05/11/2015  ? Procedure:  ESOPHAGOGASTRODUODENOSCOPY (EGD) WITH PROPOFOL;  Surgeon: Manya Silvas, MD;  Location: [facility] ENDOSCOPY;  Service: Endoscopy;  Laterality: N/A;  ? EYE SURGERY    ? laser surgery bilat   ? HERNIA REPAIR    ? KNEE ARTHROSCOPY    ? LUMBAR LAMINECTOMY/DECOMPRESSION MICRODISCECTOMY Bilateral 09/14/2015  ? Procedure: MICRO LUMBAR BILATERAL DECOMPRESSION L4 - L5;  Surgeon: Susa Day, MD;  Location: [facility];  Service: Orthopedics;  Laterality: Bilateral;  ? REDUCTION MAMMAPLASTY Bilateral 1980  ? RHINOPLASTY    ? TONSILLECTOMY    ? TOTAL KNEE ARTHROPLASTY Left 06/18/2016  ? Procedure: LEFT TOTAL KNEE ARTHROPLASTY;  Surgeon: Paralee Cancel, MD;  Location: [facility];  Service: Orthopedics;  Laterality: Left;  Adductor Block  ? TOTAL KNEE ARTHROPLASTY Right 02/11/2020  ? Procedure: TOTAL KNEE ARTHROPLASTY;  Surgeon: Paralee Cancel, MD;  Location: [facility];  Service: Orthopedics;  Laterality: Right;  ? TUBAL LIGATION    ? UVULOPALATOPHARYNGOPLASTY    ? ?Patient Active Problem List  ? Diagnosis Date Noted  ? Osteoarthritis of right knee 02/12/2020  ?  S/P total knee arthroplasty, right 02/11/2020  ? Obese 06/20/2016  ? Status post right knee replacement 06/19/2016  ? S/P left TKA 06/18/2016  ? Spinal stenosis of lumbar region 09/14/2015  ? ? ?REFERRING DIAG: Radiculopathy lumbar region ? ?THERAPY DIAG:  ?Other low back pain ? ?History of falling ? ?Unsteady gait ? ?PERTINENT HISTORY: Low back pain. Had limited success with lumbar injections. Temporary reliefe and pain returned a few days. Pt scuffles her feet when she walks. Having trouble with balance and pt bumping into stuff. Has a rolator for balance about a week ago. No radiating symptoms today. Pain is in L low back and L posterior and lateral hip currently. Has bladder incontinence for the past few months. Unsure if she told her doctor about the bladder. Dr. Manuella Ghazi is aware of her bladder challenges. No saddle anesthesia. Does not do as much now as she did before. Has  hydrocephalus. Has an appontment at the end of April about the hydrocephalus.Pertinent History. Low back pain. Had limited success with lumbar injections. Temporary reliefe and pain returned a few days. Pt scuffles her feet when she walks. Having trouble with balance and pt bumping into stuff. Has a rolator for balance about a week ago. No radiating symptoms today. Pain is in L low back and L posterior and lateral hip currently. Has bladder incontinence for the past few months. Unsure if she told her doctor about the bladder. Dr. Manuella Ghazi is aware of her bladder challenges. No saddle anesthesia. Does not do as much now as she did before. Has hydrocephalus. Has an appontment at the end of April about the hydrocephalus.. The comment is Husband is the historian. Taken on 05/29/21 1509 ? ?PRECAUTIONS: Fall risk ? ?SUBJECTIVE: Back is a little better than it was. Ached yesterday and this morning. Better now. 5-6/10 low back pain currently. Just a dull ache when sitting still.  Does not fall out of bed anymore. ? ? ? ?PAIN:  ?Are you having pain? Yes: NPRS scale: 5-6/10 ?Pain location: low back ? ?Latex allergy.  ? ? ?TODAY'S TREATMENT:  ?Therapeutic exercise ? ?Bent over hip extension over table ? R 10x3 ? L 10x3 ? ?Standing hip abduction with B UE assist  ? R 10x2 ? L 10x2 ? ?Seated trunk rotation R and L 10x3 each ? ?Seated hip extension isometrics  ? R 10x3 with 5 second holds ? L 10x3 with 5 second holds ? ?Seated B scapular retraction 10x3 with 5 second holds to promote thoracic extension and decrease stress to low back.  ? ? ?Observation: no back ache reported when pt sitting upright with back resting on back rest of chair supported.  ? ? ? ?  ? ?Improved exercise technique, movement at target joints, use of target muscles after mod verbal, visual, tactile cues.  ?  ? ? ?  ?Response to treatment ?No ache or pain in low back after session.  ? ? ? ? ? ?  ?Clinical impression ?Continued working on glute med and max  strengthening, as well as thoracic extension and trunk mobility to decrease stress to low back. No back ache or pain reported after session. Pt will benefit from continued skilled physical therapy services to decrease pain, improve strength, balance, and function.  ? ? ?  ? ? ?PATIENT EDUCATION: ?Education details: there-ex ?Person educated: Patient ?Education method: Explanation, Demonstration, Tactile cues, Verbal cues, and Handouts ?Education comprehension: verbalized understanding and returned demonstration ? ? ?HOME EXERCISE PROGRAM: ? ?Access Code: [email or web address] ?  URL: [email or web address] ?Date: 06/12/2021 ?Prepared by: Joneen Boers ? ?Exercises ?- Proper Sit to Stand Technique  - 2 x daily - 7 x weekly - 2 sets - 5 reps ? ?Sitting with lumbar towel roll.  ? ?- Standing Hip Abduction with Counter Support  - 1 x daily - 7 x weekly - 2 sets - 10 reps ?- Supine Lower Trunk Rotation  - 1 x daily - 7 x weekly - 3 sets - 10 reps ? ?- Hooklying Clamshell with Resistance  - 1 x daily - 7 x weekly - 3 sets - 10 reps. RED band ? ? ? ? ? ? PT Short Term Goals - 05/29/21 1938   ? ?  ? PT SHORT TERM GOAL #1  ? Title Pt will be independent with her initial HEP to decrease pain, improve strength, balance, and function.   ? Baseline Pt has started her HEP (05/29/2021)   ? Time 3   ? Period Weeks   ? Status New   ? Target Date 06/22/21   ? ?  ?  ? ?  ? ? ? PT Long Term Goals - 05/29/21 1939   ? ?  ? PT LONG TERM GOAL #1  ? Title Pt will have a decrease in low back pain to 5/10 or less at worst to promote ability to perform standing activities as well as improve ability to pick up items from the floor more comfortably.   ? Baseline 9/10 low back pain at worst for the past 3 months (05/29/2021)   ? Time 8   ? Period Weeks   ? Status New   ? Target Date 07/27/21   ?  ? PT LONG TERM GOAL #2  ? Title Pt will improve bilateral hip extension and abduction strength by at least 1/2 MMT to promote balance as well as improve  ability to perform standing tasks more comfortably for her back.   ? Baseline Hip extension 4/5 R, 4-/5 L, hip abduction 4/5 R, 4-/5 L (05/29/2021)   ? Time 8   ? Period Weeks   ? Status New   ? Target Date 07/27/21   ?  ? PT LONG TERM GOAL #3  ? T

## 2021-07-03 ENCOUNTER — Ambulatory Visit: Payer: Medicare Other

## 2021-07-03 DIAGNOSIS — M6281 Muscle weakness (generalized): Secondary | ICD-10-CM

## 2021-07-03 DIAGNOSIS — M5416 Radiculopathy, lumbar region: Secondary | ICD-10-CM

## 2021-07-03 DIAGNOSIS — M5459 Other low back pain: Secondary | ICD-10-CM | POA: Diagnosis not present

## 2021-07-03 DIAGNOSIS — R2681 Unsteadiness on feet: Secondary | ICD-10-CM

## 2021-07-03 DIAGNOSIS — Z9181 History of falling: Secondary | ICD-10-CM

## 2021-07-03 NOTE — Therapy (Signed)
?OUTPATIENT PHYSICAL THERAPY TREATMENT NOTE ? ? ?Patient Name: Destiny Shaw ?MRN: [id number] ?DOB:[birth year], 71 y.o., female ?Today's Date: 07/03/2021 ? ?PCP: Kirk Ruths, MD ?REFERRING PROVIDER: Suella Broad, MD ? ? PT End of Session - 07/03/21 1501   ? ? Visit Number 8   ? Number of Visits 17   ? Date for PT Re-Evaluation 07/27/21   ? Authorization Type 8   ? Authorization Time Period of 10 progress report   ? PT Start Time 1502   ? PT Stop Time 6195   ? PT Time Calculation (min) 42 min   ? Activity Tolerance Patient tolerated treatment well   ? Behavior During Therapy [facility] for tasks assessed/performed   ? ?  ?  ? ?  ? ? ? ? ? ? ? ? ?Past Medical History:  ?Diagnosis Date  ? ADD (attention deficit disorder)   ? Anemia   ? Anginal pain ([place])   ? pt states has occas chest pain relates to indigestion; pt uses rest to relieve DR [place]-   ? Anxiety   ? Arthritis   ? Concussion   ? Depression   ? Diabetes mellitus without complication ([place])   ? type 2   ? Dizziness   ? Fall   ? GERD (gastroesophageal reflux disease)   ? Headache   ? History of urinary tract infection   ? Hyperlipidemia   ? Hypertension   ? IBS (irritable bowel syndrome)   ? Imbalance   ? Numbness   ? right leg   ? Numbness in both hands   ? comes and goes   ? Pneumonia   ? last episode approx 1 year ago   ? Sleep apnea   ? uses cpap not all the time   ? Wears glasses   ? ?Past Surgical History:  ?Procedure Laterality Date  ? BREAST CYST ASPIRATION Left 1980's  ? neg  ? BREAST CYST EXCISION Right 1980's  ? neg  ? BREAST LUMPECTOMY Right   ? BREAST SURGERY    ? CARPAL TUNNEL RELEASE    ? CESAREAN SECTION    ? times 2  ? COLONOSCOPY WITH PROPOFOL N/A 05/11/2015  ? Procedure: COLONOSCOPY WITH PROPOFOL;  Surgeon: Manya Silvas, MD;  Location[facility] ENDOSCOPY;  Service: Endoscopy;  Laterality: N/A;  ? DE QUERVAIN'S RELEASE Right   ? ESOPHAGOGASTRODUODENOSCOPY (EGD) WITH PROPOFOL N/A 05/11/2015  ? Procedure:  ESOPHAGOGASTRODUODENOSCOPY (EGD) WITH PROPOFOL;  Surgeon: Manya Silvas, MD;  Location: [facility] ENDOSCOPY;  Service: Endoscopy;  Laterality: N/A;  ? EYE SURGERY    ? laser surgery bilat   ? HERNIA REPAIR    ? KNEE ARTHROSCOPY    ? LUMBAR LAMINECTOMY/DECOMPRESSION MICRODISCECTOMY Bilateral 09/14/2015  ? Procedure: MICRO LUMBAR BILATERAL DECOMPRESSION L4 - L5;  Surgeon: Susa Day, MD;  Location: [facility] ORS;  Service: Orthopedics;  Laterality: Bilateral;  ? REDUCTION MAMMAPLASTY Bilateral 1980  ? RHINOPLASTY    ? TONSILLECTOMY    ? TOTAL KNEE ARTHROPLASTY Left 06/18/2016  ? Procedure: LEFT TOTAL KNEE ARTHROPLASTY;  Surgeon: Paralee Cancel, MD;  Location: [facility] ORS;  Service: Orthopedics;  Laterality: Left;  Adductor Block  ? TOTAL KNEE ARTHROPLASTY Right 02/11/2020  ? Procedure: TOTAL KNEE ARTHROPLASTY;  Surgeon: Paralee Cancel, MD;  Location: [facility] ORS;  Service: Orthopedics;  Laterality: Right;  ? TUBAL LIGATION    ? UVULOPALATOPHARYNGOPLASTY    ? ?Patient Active Problem List  ? Diagnosis Date Noted  ? Osteoarthritis of right knee  02/12/2020  ? S/P total knee arthroplasty, right 02/11/2020  ? Obese 06/20/2016  ? Status post right knee replacement 06/19/2016  ? S/P left TKA 06/18/2016  ? Spinal stenosis of lumbar region 09/14/2015  ? ? ?REFERRING DIAG: Radiculopathy lumbar region ? ?THERAPY DIAG:  ?Other low back pain ? ?History of falling ? ?Unsteady gait ? ?Radiculopathy, lumbar region ? ?Muscle weakness (generalized) ? ?PERTINENT HISTORY: Low back pain. Had limited success with lumbar injections. Temporary reliefe and pain returned a few days. Pt scuffles her feet when she walks. Having trouble with balance and pt bumping into stuff. Has a rolator for balance about a week ago. No radiating symptoms today. Pain is in L low back and L posterior and lateral hip currently. Has bladder incontinence for the past few months. Unsure if she told her doctor about the bladder. Dr. Manuella Ghazi is aware of her bladder challenges. No saddle  anesthesia. Does not do as much now as she did before. Has hydrocephalus. Has an appontment at the end of April about the hydrocephalus.Pertinent History. Low back pain. Had limited success with lumbar injections. Temporary reliefe and pain returned a few days. Pt scuffles her feet when she walks. Having trouble with balance and pt bumping into stuff. Has a rolator for balance about a week ago. No radiating symptoms today. Pain is in L low back and L posterior and lateral hip currently. Has bladder incontinence for the past few months. Unsure if she told her doctor about the bladder. Dr. Manuella Ghazi is aware of her bladder challenges. No saddle anesthesia. Does not do as much now as she did before. Has hydrocephalus. Has an appontment at the end of April about the hydrocephalus.. The comment is Husband is the historian. Taken on 05/29/21 1509 ? ?PRECAUTIONS: Fall risk ? ?SUBJECTIVE:  Feels dizzier. Has not fallen though Has a walker but is so big... uses it when she goes out. Has L LE pain (L5 dermatome), about 5-6/10 currently when walking.  Feels better when sitting. Pt states not using a SPC because she tends to trip on it.  ? ? ? ?PAIN:  ?Are you having pain? Yes: NPRS scale: 5-6/10 ?Pain location: low back ? ?Latex allergy.  ? ? ?TODAY'S TREATMENT:  ?Therapeutic exercise ? ?Seated manually resisted L upper trunk rotation to promote R lower trunk rotation for more neutral posture 10x3 with 5 second holds ? Decreased L L5 dermatome pain during gait afterwards ? ?Seated R trunk rotation 10x ? ?Seated hip extension isometrics  ? R 10x5 seconds for 2 sets ? Decreased L lumbar rotation posture ? ?Gait with rw 50 ft. No pain. Pt feels comfortable. Pt was recommended to use a rw for safety since pt reports tripping on her SPC.  ? ? ?Seated B scapular retraction 10x3 with 5 second holds to promote thoracic extension and decrease stress to low back.  ? ?Standing hip abduction with B UE assist  ?               R 10x3 ?                L 10x3 ? ? ? ? ?  ? ?Improved exercise technique, movement at target joints, use of target muscles after mod verbal, visual, tactile cues.  ?  ? ?Manual therapy  ?Seated STM B lumbar paraspinal muscles to decrease tension  ?  ?Response to treatment ?No pain after session ? ? ? ? ?  ?Clinical impression ?Worked on improving lumbar posture,  glute and trunk strength to decrease stress to low back. Decreased L L5 pain after promoting neutral lumbar posture. No pain reported after session. Pt will benefit from continued skilled physical therapy services to decrease pain, improve strength, balance, and function.  ? ? ?  ? ? ?PATIENT EDUCATION: ?Education details: there-ex ?Person educated: Patient ?Education method: Explanation, Demonstration, Tactile cues, Verbal cues, and Handouts ?Education comprehension: verbalized understanding and returned demonstration ? ? ?HOME EXERCISE PROGRAM: ? ?Access Code: [email or web address] ?URL: [email or web address] ?Date: 06/12/2021 ?Prepared by: Joneen Boers ? ?Exercises ?- Proper Sit to Stand Technique  - 2 x daily - 7 x weekly - 2 sets - 5 reps ? ?Sitting with lumbar towel roll.  ? ?- Standing Hip Abduction with Counter Support  - 1 x daily - 7 x weekly - 2 sets - 10 reps ?- Supine Lower Trunk Rotation  - 1 x daily - 7 x weekly - 3 sets - 10 reps ? ?- Hooklying Clamshell with Resistance  - 1 x daily - 7 x weekly - 3 sets - 10 reps. RED band ? ? ? ? ? ? PT Short Term Goals - 05/29/21 1938   ? ?  ? PT SHORT TERM GOAL #1  ? Title Pt will be independent with her initial HEP to decrease pain, improve strength, balance, and function.   ? Baseline Pt has started her HEP (05/29/2021)   ? Time 3   ? Period Weeks   ? Status New   ? Target Date 06/22/21   ? ?  ?  ? ?  ? ? ? PT Long Term Goals - 05/29/21 1939   ? ?  ? PT LONG TERM GOAL #1  ? Title Pt will have a decrease in low back pain to 5/10 or less at worst to promote ability to perform standing activities as well as improve ability to  pick up items from the floor more comfortably.   ? Baseline 9/10 low back pain at worst for the past 3 months (05/29/2021)   ? Time 8   ? Period Weeks   ? Status New   ? Target Date 07/27/21   ?  ? PT LONG TERM GOAL #2  ? Title

## 2021-07-06 ENCOUNTER — Ambulatory Visit: Payer: Medicare Other

## 2021-07-06 DIAGNOSIS — M6281 Muscle weakness (generalized): Secondary | ICD-10-CM

## 2021-07-06 DIAGNOSIS — R2681 Unsteadiness on feet: Secondary | ICD-10-CM

## 2021-07-06 DIAGNOSIS — Z9181 History of falling: Secondary | ICD-10-CM

## 2021-07-06 DIAGNOSIS — M5459 Other low back pain: Secondary | ICD-10-CM | POA: Diagnosis not present

## 2021-07-06 DIAGNOSIS — M5416 Radiculopathy, lumbar region: Secondary | ICD-10-CM

## 2021-07-06 NOTE — Therapy (Signed)
?OUTPATIENT PHYSICAL THERAPY TREATMENT NOTE ? ? ?Patient Name: Destiny Shaw ?MRN: [id number] ?DOB:[birth year], 71 y.o., female ?Today's Date: 07/06/2021 ? ?PCP: Kirk Ruths, MD ?REFERRING PROVIDER: Suella Broad, MD ? ? PT End of Session - 07/06/21 1148   ? ? Visit Number 9   ? Number of Visits 17   ? Date for PT Re-Evaluation 07/27/21   ? Authorization Type 9   ? Authorization Time Period of 10 progress report   ? PT Start Time 1105   ? PT Stop Time 2130   ? PT Time Calculation (min) 44 min   ? Activity Tolerance Patient tolerated treatment well   ? Behavior During Therapy [facility] for tasks assessed/performed   ? ?  ?  ? ?  ? ? ? ? ? ? ? ? ? ?Past Medical History:  ?Diagnosis Date  ? ADD (attention deficit disorder)   ? Anemia   ? Anginal pain ([place])   ? pt states has occas chest pain relates to indigestion; pt uses rest to relieve DR [place]-   ? Anxiety   ? Arthritis   ? Concussion   ? Depression   ? Diabetes mellitus without complication ([place])   ? type 2   ? Dizziness   ? Fall   ? GERD (gastroesophageal reflux disease)   ? Headache   ? History of urinary tract infection   ? Hyperlipidemia   ? Hypertension   ? IBS (irritable bowel syndrome)   ? Imbalance   ? Numbness   ? right leg   ? Numbness in both hands   ? comes and goes   ? Pneumonia   ? last episode approx 1 year ago   ? Sleep apnea   ? uses cpap not all the time   ? Wears glasses   ? ?Past Surgical History:  ?Procedure Laterality Date  ? BREAST CYST ASPIRATION Left 1980's  ? neg  ? BREAST CYST EXCISION Right 1980's  ? neg  ? BREAST LUMPECTOMY Right   ? BREAST SURGERY    ? CARPAL TUNNEL RELEASE    ? CESAREAN SECTION    ? times 2  ? COLONOSCOPY WITH PROPOFOL N/A 05/11/2015  ? Procedure: COLONOSCOPY WITH PROPOFOL;  Surgeon: Manya Silvas, MD;  Location: [facility] ENDOSCOPY;  Service: Endoscopy;  Laterality: N/A;  ? DE QUERVAIN'S RELEASE Right   ? ESOPHAGOGASTRODUODENOSCOPY (EGD) WITH PROPOFOL N/A 05/11/2015  ? Procedure:  ESOPHAGOGASTRODUODENOSCOPY (EGD) WITH PROPOFOL;  Surgeon: Manya Silvas, MD;  Location: [facility] ENDOSCOPY;  Service: Endoscopy;  Laterality: N/A;  ? EYE SURGERY    ? laser surgery bilat   ? HERNIA REPAIR    ? KNEE ARTHROSCOPY    ? LUMBAR LAMINECTOMY/DECOMPRESSION MICRODISCECTOMY Bilateral 09/14/2015  ? Procedure: MICRO LUMBAR BILATERAL DECOMPRESSION L4 - L5;  Surgeon: Susa Day, MD;  Location: [facility]S;  Service: Orthopedics;  Laterality: Bilateral;  ? REDUCTION MAMMAPLASTY Bilateral 1980  ? RHINOPLASTY    ? TONSILLECTOMY    ? TOTAL KNEE ARTHROPLASTY Left 06/18/2016  ? Procedure: LEFT TOTAL KNEE ARTHROPLASTY;  Surgeon: Paralee Cancel, MD;  Location: [facility];  Service: Orthopedics;  Laterality: Left;  Adductor Block  ? TOTAL KNEE ARTHROPLASTY Right 02/11/2020  ? Procedure: TOTAL KNEE ARTHROPLASTY;  Surgeon: Paralee Cancel, MD;  Location: [facility];  Service: Orthopedics;  Laterality: Right;  ? TUBAL LIGATION    ? UVULOPALATOPHARYNGOPLASTY    ? ?Patient Active Problem List  ? Diagnosis Date Noted  ? Osteoarthritis of right  knee 02/12/2020  ? S/P total knee arthroplasty, right 02/11/2020  ? Obese 06/20/2016  ? Status post right knee replacement 06/19/2016  ? S/P left TKA 06/18/2016  ? Spinal stenosis of lumbar region 09/14/2015  ? ? ?REFERRING DIAG: Radiculopathy lumbar region ? ?THERAPY DIAG:  ?Other low back pain ? ?History of falling ? ?Unsteady gait ? ?Radiculopathy, lumbar region ? ?Muscle weakness (generalized) ? ?PERTINENT HISTORY: Low back pain. Had limited success with lumbar injections. Temporary reliefe and pain returned a few days. Pt scuffles her feet when she walks. Having trouble with balance and pt bumping into stuff. Has a rolator for balance about a week ago. No radiating symptoms today. Pain is in L low back and L posterior and lateral hip currently. Has bladder incontinence for the past few months. Unsure if she told her doctor about the bladder. Dr. Manuella Ghazi is aware of her bladder challenges. No saddle  anesthesia. Does not do as much now as she did before. Has hydrocephalus. Has an appontment at the end of April about the hydrocephalus.Pertinent History. Low back pain. Had limited success with lumbar injections. Temporary reliefe and pain returned a few days. Pt scuffles her feet when she walks. Having trouble with balance and pt bumping into stuff. Has a rolator for balance about a week ago. No radiating symptoms today. Pain is in L low back and L posterior and lateral hip currently. Has bladder incontinence for the past few months. Unsure if she told her doctor about the bladder. Dr. Manuella Ghazi is aware of her bladder challenges. No saddle anesthesia. Does not do as much now as she did before. Has hydrocephalus. Has an appontment at the end of April about the hydrocephalus.. The comment is Husband is the historian. Taken on 05/29/21 1509 ? ?PRECAUTIONS: Fall risk ? ?SUBJECTIVE:  Pt fell about 3 days ago while picking up a light box and turning to put it on the bed. Pt did not have hand support. Pt fell down, slid across the floor and her head hit a trunk head hurt afterwards. Head feels some better than what it was. Felt dizzy. The dizziness is better but has not gone away. Does not feel like she broke anything. Might get a shunt in for her hydrocephalus. Not sure when. No back pain when walking with rollator, has back pain when walking without it. No hip pain currently.Feels improvement with PT and able to control things a little bit more.  ? ? ?PAIN:  ?Are you having pain? Yes: NPRS scale: 0/10 ?Pain location: low back ? ?Latex allergy.  ? ? ?TODAY'S TREATMENT:  ?Therapeutic exercise ? ?Pt was recommended to see a doctor if she feels like anything is worse from the fall. Pt verbalized understanding. ? ?Adjusted pt rollator to proper height. Feels better per pt.  ? ?Seated R trunk rotation 10x3 ? ?Seated transversus abdominis contraction 10x3 with 5 second holds ? Max verbal and tactile cues needed to maintain proper  contraction ? Difficulty with activation of muscle for pt.  ? ? ?Seated hip extension isometrics  ? R 10x5 seconds ? Decreased L lumbar rotation posture ? ?Pt was recommended vestibular therapy for balance. Pt verbalized understanding.  ? ? ?Seated B scapular retraction 10x3 with 5 second holds to promote thoracic extension and decrease stress to low back.  ? ? ?Standing hip abduction with B UE assist  ?               R 10x3 ?  L 10x3 ? ? ?Improved exercise technique, movement at target joints, use of target muscles after mod verbal, visual, tactile cues.  ?  ? ?Response to treatment ?Pt tolerated session well without aggravation of symptoms. No dizziness reported after session.  ? ? ?  ?Clinical impression ?Pt was recommended to ask her MD for a vestibular PT rehab referral to work on her balance at the main location with vestibular therapists to decrease fall risk. Pt verbalized understanding. Continued working on posture, trunk and hip strengthening to decrease stress to low back. Pt tolerated session well without aggravation of symptoms. No dizziness reported after session. Pt will benefit from continued skilled physical therapy services to decrease pain, improve strength, balance, and function.  ? ? ?  ? ? ?PATIENT EDUCATION: ?Education details: there-ex ?Person educated: Patient ?Education method: Explanation, Demonstration, Tactile cues, Verbal cues, and Handouts ?Education comprehension: verbalized understanding and returned demonstration ? ? ?HOME EXERCISE PROGRAM: ? ?Access Code: [email or web address] ?URL: [email or web address] ?Date: 06/12/2021 ?Prepared by: Joneen Boers ? ?Exercises ?- Proper Sit to Stand Technique  - 2 x daily - 7 x weekly - 2 sets - 5 reps ? ?Sitting with lumbar towel roll.  ? ?- Standing Hip Abduction with Counter Support  - 1 x daily - 7 x weekly - 2 sets - 10 reps ?- Supine Lower Trunk Rotation  - 1 x daily - 7 x weekly - 3 sets - 10 reps ? ?- Hooklying Clamshell with  Resistance  - 1 x daily - 7 x weekly - 3 sets - 10 reps. RED band ? ? ? ? ? ? PT Short Term Goals - 05/29/21 1938   ? ?  ? PT SHORT TERM GOAL #1  ? Title Pt will be independent with her initial HEP to decrease pain, impro

## 2021-07-10 ENCOUNTER — Ambulatory Visit: Payer: Medicare Other

## 2021-07-13 ENCOUNTER — Ambulatory Visit: Payer: Medicare Other

## 2021-07-13 DIAGNOSIS — Z9181 History of falling: Secondary | ICD-10-CM

## 2021-07-13 DIAGNOSIS — M5416 Radiculopathy, lumbar region: Secondary | ICD-10-CM

## 2021-07-13 DIAGNOSIS — M6281 Muscle weakness (generalized): Secondary | ICD-10-CM

## 2021-07-13 DIAGNOSIS — M5459 Other low back pain: Secondary | ICD-10-CM | POA: Diagnosis not present

## 2021-07-13 DIAGNOSIS — R2681 Unsteadiness on feet: Secondary | ICD-10-CM

## 2021-07-13 NOTE — Therapy (Signed)
OUTPATIENT PHYSICAL THERAPY TREATMENT NOTE And Progress Report (05/29/2021 - 07/13/2021)   Patient Name: Destiny Shaw MRN: [id number] DOB:01-Jun-1950, 71 y.o., female Today's Date: 07/13/2021  PCP: Kirk Ruths, MD REFERRING PROVIDER: Suella Broad, MD   PT End of Session - 07/13/21 1418     Visit Number 10    Number of Visits 17    Date for PT Re-Evaluation 07/27/21    Authorization Type 10    Authorization Time Period of 10 progress report    PT Start Time 1419    PT Stop Time 1501    PT Time Calculation (min) 42 min    Activity Tolerance Patient tolerated treatment well    Behavior During Therapy WFL for tasks assessed/performed                     Past Medical History:  Diagnosis Date   ADD (attention deficit disorder)    Anemia    Anginal pain ([place])    pt states has occas chest pain relates to indigestion; pt uses rest to relieve DR Parachos - [facility] LOV-    Anxiety    Arthritis    Concussion    Depression    Diabetes mellitus without complication ([place])    type 2    Dizziness    Fall    GERD (gastroesophageal reflux disease)    Headache    History of urinary tract infection    Hyperlipidemia    Hypertension    IBS (irritable bowel syndrome)    Imbalance    Numbness    right leg    Numbness in both hands    comes and goes    Pneumonia    last episode approx 1 year ago    Sleep apnea    uses cpap not all the time    Wears glasses    Past Surgical History:  Procedure Laterality Date   BREAST CYST ASPIRATION Left 1980's   neg   BREAST CYST EXCISION Right 1980's   neg   BREAST LUMPECTOMY Right    BREAST SURGERY     CARPAL TUNNEL RELEASE     CESAREAN SECTION     times 2   COLONOSCOPY WITH PROPOFOL N/A 05/11/2015   Procedure: COLONOSCOPY WITH PROPOFOL;  Surgeon: Manya Silvas, MD;  Location: [facility] ENDOSCOPY;  Service: Endoscopy;  Laterality: N/A;   DE QUERVAIN'S RELEASE Right    ESOPHAGOGASTRODUODENOSCOPY (EGD) WITH  PROPOFOL N/A 05/11/2015   Procedure: ESOPHAGOGASTRODUODENOSCOPY (EGD) WITH PROPOFOL;  Surgeon: Manya Silvas, MD;  Location: [facility] ENDOSCOPY;  Service: Endoscopy;  Laterality: N/A;   EYE SURGERY     laser surgery bilat    HERNIA REPAIR     KNEE ARTHROSCOPY     LUMBAR LAMINECTOMY/DECOMPRESSION MICRODISCECTOMY Bilateral 09/14/2015   Procedure: MICRO LUMBAR BILATERAL DECOMPRESSION L4 - L5;  Surgeon: Susa Day, MD;  Location: [facility] ORS;  Service: Orthopedics;  Laterality: Bilateral;   REDUCTION MAMMAPLASTY Bilateral 1980   RHINOPLASTY     TONSILLECTOMY     TOTAL KNEE ARTHROPLASTY Left 06/18/2016   Procedure: LEFT TOTAL KNEE ARTHROPLASTY;  Surgeon: Paralee Cancel, MD;  Location: [facility] ORS;  Service: Orthopedics;  Laterality: Left;  Adductor Block   TOTAL KNEE ARTHROPLASTY Right 02/11/2020   Procedure: TOTAL KNEE ARTHROPLASTY;  Surgeon: Paralee Cancel, MD;  Location: [facility] ORS;  Service: Orthopedics;  Laterality: Right;   TUBAL LIGATION     UVULOPALATOPHARYNGOPLASTY     Patient Active Problem List   Diagnosis  Date Noted   Osteoarthritis of right knee 02/12/2020   S/P total knee arthroplasty, right 02/11/2020   Obese 06/20/2016   Status post right knee replacement 06/19/2016   S/P left TKA 06/18/2016   Spinal stenosis of lumbar region 09/14/2015    REFERRING DIAG: Radiculopathy lumbar region  THERAPY DIAG:  Other low back pain  History of falling  Unsteady gait  Radiculopathy, lumbar region  Muscle weakness (generalized)  PERTINENT HISTORY: Low back pain. Had limited success with lumbar injections. Temporary reliefe and pain returned a few days. Pt scuffles her feet when she walks. Having trouble with balance and pt bumping into stuff. Has a rolator for balance about a week ago. No radiating symptoms today. Pain is in L low back and L posterior and lateral hip currently. Has bladder incontinence for the past few months. Unsure if she told her doctor about the bladder. Dr. Manuella Ghazi is aware of  her bladder challenges. No saddle anesthesia. Does not do as much now as she did before. Has hydrocephalus. Has an appontment at the end of April about the hydrocephalus.Pertinent History. Low back pain. Had limited success with lumbar injections. Temporary reliefe and pain returned a few days. Pt scuffles her feet when she walks. Having trouble with balance and pt bumping into stuff. Has a rolator for balance about a week ago. No radiating symptoms today. Pain is in L low back and L posterior and lateral hip currently. Has bladder incontinence for the past few months. Unsure if she told her doctor about the bladder. Dr. Manuella Ghazi is aware of her bladder challenges. No saddle anesthesia. Does not do as much now as she did before. Has hydrocephalus. Has an appontment at the end of April about the hydrocephalus.. The comment is Husband is the historian. Taken on 05/29/21 1509  PRECAUTIONS: Fall risk  SUBJECTIVE:  Did not bring her walker because she did not want to deal with it. Back is not too bad today, its doing a little better today. Has been doing a lot more resting. Does not remember which exercises bother her or not. Tends to overdo her HEP.      PAIN:  Are you having pain? Yes: NPRS scale: 0/10 Pain location: low back  Latex allergy.    TODAY'S TREATMENT:  Therapeutic exercise  Seated manually resisted hip extension, hip abduction each LE  Reviewed progress with PT towards goals  Seated clamshell isometrics, hips less than 90 degrees flexion 10x3 with 5 seocnd holds  Seated manually resisted hip extension isometrics  R 10x5 seconds for 2 sets  L 10x5 seconds for 2 sets   Low back discomfort.   Seated R trunk rotation 10x3  Seated transversus abdominis contraction 10x3 with 5 second holds  Max verbal and tactile cues needed to maintain proper contraction  Cues to not hold breath  Better able to perform today compared to previous visit   Seated B scapular retraction 10x3 with 5  second holds to promote thoracic extension and decrease stress to low back.      Improved exercise technique, movement at target joints, use of target muscles after mod verbal, visual, tactile cues.     Response to treatment Pt tolerated session well without aggravation of symptoms.       Clinical impression Pt demonstrates decreased low back pain, improved B glute med and max strength since initial evaluation. Pt making progress with PT towards goals. Continued working on trunk and hip strengthening to decrease stress to low back. Pt  tolerated session well without aggravation of symptoms. No dizziness reported after session. Pt will benefit from continued skilled physical therapy services to decrease pain, improve strength, balance, and function.        PATIENT EDUCATION: Education details: there-ex Person educated: Patient Education method: Consulting civil engineer, Demonstration, Corporate treasurer cues, Verbal cues, and Handouts Education comprehension: verbalized understanding and returned demonstration   HOME EXERCISE PROGRAM:  Access Code: [email or web address] URL: [email or web address] Date: 06/12/2021 Prepared by: Joneen Boers  Exercises - Proper Sit to Stand Technique  - 2 x daily - 7 x weekly - 2 sets - 5 reps  Sitting with lumbar towel roll.   - Standing Hip Abduction with Counter Support  - 1 x daily - 7 x weekly - 2 sets - 10 reps - Supine Lower Trunk Rotation  - 1 x daily - 7 x weekly - 3 sets - 10 reps  - Hooklying Clamshell with Resistance  - 1 x daily - 7 x weekly - 3 sets - 10 reps. RED band       PT Short Term Goals - 07/13/21 1421       PT SHORT TERM GOAL #1   Title Pt will be independent with her initial HEP to decrease pain, improve strength, balance, and function.    Baseline Pt has started her HEP (05/29/2021); Does the exercises sometimes (07/13/2021)    Time 3    Period Weeks    Status Partially Met    Target Date 06/22/21              PT Long Term  Goals - 07/13/21 1422       PT LONG TERM GOAL #1   Title Pt will have a decrease in low back pain to 5/10 or less at worst to promote ability to perform standing activities as well as improve ability to pick up items from the floor more comfortably.    Baseline 9/10 low back pain at worst for the past 3 months (05/29/2021); 7/10 at most for the past 7 days (07/13/2021)    Time 8    Period Weeks    Status Partially Met    Target Date 07/27/21      PT LONG TERM GOAL #2   Title Pt will improve bilateral hip extension and abduction strength by at least 1/2 MMT to promote balance as well as improve ability to perform standing tasks more comfortably for her back.    Baseline Hip extension 4/5 R, 4-/5 L, hip abduction 4/5 R, 4-/5 L (05/29/2021); hip extensnion 4+/5 R, 4/5 L,  hip abduction 4+/5 R, 4+/5 L (07/13/2021)    Time 8    Period Weeks    Status Achieved    Target Date 07/27/21      PT LONG TERM GOAL #3   Title Pt will improve her lumbar FOTO score by at least 10 points as a demonstration of improved function.    Baseline Lumbar spine FOTO 23 (05/29/2021); 30 (07/13/2021)    Time 8    Period Weeks    Status Partially Met    Target Date 07/27/21              Plan - 07/13/21 1518     Clinical Impression Statement Pt demonstrates decreased low back pain, improved B glute med and max strength since initial evaluation. Pt making progress with PT towards goals. Continued working on trunk and hip strengthening to decrease stress to low back. Pt tolerated session well without aggravation of symptoms.  No dizziness reported after session. Pt will benefit from continued skilled physical therapy services to decrease pain, improve strength, balance, and function.    Personal Factors and Comorbidities Comorbidity 3+;Age;Fitness;Past/Current Experience;Time since onset of injury/illness/exacerbation    Comorbidities ADD, HTN, depression, anxiety, hx of concussion    Examination-Activity Limitations Bed  Mobility;Bend;Locomotion Level;Stand;Stairs;Lift;Transfers    Stability/Clinical Decision Making Stable/Uncomplicated    Clinical Decision Making Low    Rehab Potential Fair    Clinical Impairments Affecting Rehab Potential memory, chronicity of condition, age, hx of concussion    PT Frequency 2x / week    PT Duration 8 weeks    PT Treatment/Interventions Therapeutic activities;Therapeutic exercise;Balance training;Neuromuscular re-education;Patient/family education;Manual techniques;Dry needling;Vestibular;Canalith Repostioning;Electrical Stimulation;Gait training;Stair training;Functional mobility training;Iontophoresis 40m/ml Dexamethasone    PT Next Visit Plan Posture, trunk, hip strengthening, manual techniques, balance, modalities PRN    Consulted and Agree with Plan of Care Patient;Family member/caregiver    Family Member Consulted Husband                 Thank you for your referral.    MJoneen BoersPT, DPT  07/13/2021, 3:20 PM

## 2021-07-17 ENCOUNTER — Ambulatory Visit: Payer: Medicare Other

## 2021-07-20 ENCOUNTER — Ambulatory Visit: Payer: Medicare Other

## 2021-07-20 DIAGNOSIS — Z9181 History of falling: Secondary | ICD-10-CM

## 2021-07-20 DIAGNOSIS — M5416 Radiculopathy, lumbar region: Secondary | ICD-10-CM

## 2021-07-20 DIAGNOSIS — R2681 Unsteadiness on feet: Secondary | ICD-10-CM

## 2021-07-20 DIAGNOSIS — M5459 Other low back pain: Secondary | ICD-10-CM

## 2021-07-20 NOTE — Therapy (Signed)
OUTPATIENT PHYSICAL THERAPY TREATMENT NOTE And Discharge Summary    Patient Name: Destiny Shaw MRN: [id number] DOB:June 17, 1950, 71 y.o., female Today's Date: 07/20/2021  PCP: Kirk Ruths, MD REFERRING PROVIDER: Suella Broad, MD   PT End of Session - 07/20/21 1419     Visit Number 11    Number of Visits 17    Date for PT Re-Evaluation 07/27/21    Authorization Type 1    Authorization Time Period of 10 progress report    PT Start Time 1419    PT Stop Time 1458    PT Time Calculation (min) 39 min    Activity Tolerance Patient tolerated treatment well    Behavior During Therapy WFL for tasks assessed/performed                      Past Medical History:  Diagnosis Date   ADD (attention deficit disorder)    Anemia    Anginal pain ([place])    pt states has occas chest pain relates to indigestion; pt uses rest to relieve [facility] LOV-    Anxiety    Arthritis    Concussion    Depression    Diabetes mellitus without complication ([place])    type 2    Dizziness    Fall    GERD (gastroesophageal reflux disease)    Headache    History of urinary tract infection    Hyperlipidemia    Hypertension    IBS (irritable bowel syndrome)    Imbalance    Numbness    right leg    Numbness in both hands    comes and goes    Pneumonia    last episode approx 1 year ago    Sleep apnea    uses cpap not all the time    Wears glasses    Past Surgical History:  Procedure Laterality Date   BREAST CYST ASPIRATION Left 1980's   neg   BREAST CYST EXCISION Right 1980's   neg   BREAST LUMPECTOMY Right    BREAST SURGERY     CARPAL TUNNEL RELEASE     CESAREAN SECTION     times 2   COLONOSCOPY WITH PROPOFOL N/A 05/11/2015   Procedure: COLONOSCOPY WITH PROPOFOL;  Surgeon: Manya Silvas, MD;  Location: [facility] ENDOSCOPY;  Service: Endoscopy;  Laterality: N/A;   DE QUERVAIN'S RELEASE Right    ESOPHAGOGASTRODUODENOSCOPY (EGD) WITH PROPOFOL N/A 05/11/2015    Procedure: ESOPHAGOGASTRODUODENOSCOPY (EGD) WITH PROPOFOL;  Surgeon: Manya Silvas, MD;  Location: [facility] ENDOSCOPY;  Service: Endoscopy;  Laterality: N/A;   EYE SURGERY     laser surgery bilat    HERNIA REPAIR     KNEE ARTHROSCOPY     LUMBAR LAMINECTOMY/DECOMPRESSION MICRODISCECTOMY Bilateral 09/14/2015   Procedure: MICRO LUMBAR BILATERAL DECOMPRESSION L4 - L5;  Surgeon: Susa Day, MD;  Location: [facility]S;  Service: Orthopedics;  Laterality: Bilateral;   REDUCTION MAMMAPLASTY Bilateral 1980   RHINOPLASTY     TONSILLECTOMY     TOTAL KNEE ARTHROPLASTY Left 06/18/2016   Procedure: LEFT TOTAL KNEE ARTHROPLASTY;  Surgeon: Paralee Cancel, MD;  Location: [facility];  Service: Orthopedics;  Laterality: Left;  Adductor Block   TOTAL KNEE ARTHROPLASTY Right 02/11/2020   Procedure: TOTAL KNEE ARTHROPLASTY;  Surgeon: Paralee Cancel, MD;  Location: [facility];  Service: Orthopedics;  Laterality: Right;   TUBAL LIGATION     UVULOPALATOPHARYNGOPLASTY     Patient Active Problem List   Diagnosis Date  Noted   Osteoarthritis of right knee 02/12/2020   S/P total knee arthroplasty, right 02/11/2020   Obese 06/20/2016   Status post right knee replacement 06/19/2016   S/P left TKA 06/18/2016   Spinal stenosis of lumbar region 09/14/2015    REFERRING DIAG: Radiculopathy lumbar region  THERAPY DIAG:  Other low back pain  History of falling  Unsteady gait  Radiculopathy, lumbar region  PERTINENT HISTORY: Low back pain. Had limited success with lumbar injections. Temporary reliefe and pain returned a few days. Pt scuffles her feet when she walks. Having trouble with balance and pt bumping into stuff. Has a rolator for balance about a week ago. No radiating symptoms today. Pain is in L low back and L posterior and lateral hip currently. Has bladder incontinence for the past few months. Unsure if she told her doctor about the bladder. Dr. Manuella Ghazi is aware of her bladder challenges. No saddle anesthesia. Does not do  as much now as she did before. Has hydrocephalus. Has an appontment at the end of April about the hydrocephalus.Pertinent History. Low back pain. Had limited success with lumbar injections. Temporary reliefe and pain returned a few days. Pt scuffles her feet when she walks. Having trouble with balance and pt bumping into stuff. Has a rolator for balance about a week ago. No radiating symptoms today. Pain is in L low back and L posterior and lateral hip currently. Has bladder incontinence for the past few months. Unsure if she told her doctor about the bladder. Dr. Manuella Ghazi is aware of her bladder challenges. No saddle anesthesia. Does not do as much now as she did before. Has hydrocephalus. Has an appontment at the end of April about the hydrocephalus.. The comment is Husband is the historian. Taken on 05/29/21 1509  PRECAUTIONS: Fall risk  SUBJECTIVE:  Pt fell yesterday walking to the bathroom. Did not use her AD. Pt was half awake. Getting up from the floor was difficult... took about 20 minutes to get up from the floor. Hurts too bad to get onto her knees due to her knee replacements. Feels like the weight is more than she can push up. This round of physical therapy pt started with L hip and back pain. Currently has R hip pain as well. Does not feel like she is progressing with the low back and hip pain. Pt has issues in the kitchen... dizziness is part of the problem when she turns. Going to [place] next month pertaining to hydrocephalus. The hydrocephalus treatment might help with her incontinence.  Pt husband states pt being dizzy when her blood sugar is high. (Husband present as part historian)       PAIN:  Are you having pain? Yes: NPRS scale: 6/10 Pain location: low back  Latex allergy.    TODAY'S TREATMENT:  Therapeutic exercise  Time taken to listen to both pt and husband.  Reviewed POC: hold off current PT for low back pain but participate in vestibular therapy with specialized  therapists to decrease fall risk and re-injury.      Response to treatment Pt tolerated session well without aggravation of symptoms.       Clinical impression Discharged PT services today secondary to pt and husband feeling like pt is not really progressing. Pt feels improvement at times but regresses at times as well. Challenges to progress include memory and dizziness. Pt was recommended to try vestibular therapy with specialized therapists to improve balance, decrease fall risk and decrease injury/re-injury. Pt and husband verbalized  understanding.         PATIENT EDUCATION: Education details: there-ex Person educated: Patient Education method: Consulting civil engineer, Demonstration, Corporate treasurer cues, Verbal cues, and Handouts Education comprehension: verbalized understanding and returned demonstration   HOME EXERCISE PROGRAM:  Access Code: [email or web address] URL: [email or web address] Date: 06/12/2021 Prepared by: Joneen Boers  Exercises - Proper Sit to Stand Technique  - 2 x daily - 7 x weekly - 2 sets - 5 reps  Sitting with lumbar towel roll.   - Standing Hip Abduction with Counter Support  - 1 x daily - 7 x weekly - 2 sets - 10 reps - Supine Lower Trunk Rotation  - 1 x daily - 7 x weekly - 3 sets - 10 reps  - Hooklying Clamshell with Resistance  - 1 x daily - 7 x weekly - 3 sets - 10 reps. RED band       PT Short Term Goals - 07/13/21 1421       PT SHORT TERM GOAL #1   Title Pt will be independent with her initial HEP to decrease pain, improve strength, balance, and function.    Baseline Pt has started her HEP (05/29/2021); Does the exercises sometimes (07/13/2021)    Time 3    Period Weeks    Status Partially Met    Target Date 06/22/21              PT Long Term Goals - 07/13/21 1422       PT LONG TERM GOAL #1   Title Pt will have a decrease in low back pain to 5/10 or less at worst to promote ability to perform standing activities as well as improve  ability to pick up items from the floor more comfortably.    Baseline 9/10 low back pain at worst for the past 3 months (05/29/2021); 7/10 at most for the past 7 days (07/13/2021)    Time 8    Period Weeks    Status Partially Met    Target Date 07/27/21      PT LONG TERM GOAL #2   Title Pt will improve bilateral hip extension and abduction strength by at least 1/2 MMT to promote balance as well as improve ability to perform standing tasks more comfortably for her back.    Baseline Hip extension 4/5 R, 4-/5 L, hip abduction 4/5 R, 4-/5 L (05/29/2021); hip extensnion 4+/5 R, 4/5 L,  hip abduction 4+/5 R, 4+/5 L (07/13/2021)    Time 8    Period Weeks    Status Achieved    Target Date 07/27/21      PT LONG TERM GOAL #3   Title Pt will improve her lumbar FOTO score by at least 10 points as a demonstration of improved function.    Baseline Lumbar spine FOTO 23 (05/29/2021); 30 (07/13/2021)    Time 8    Period Weeks    Status Partially Met    Target Date 07/27/21              Plan - 07/20/21 1419     Clinical Impression Statement Discharged PT services today secondary to pt and husband feeling like pt is not really progressing. Pt feels improvement at times but regresses at times as well. Challenges to progress include memory and dizziness. Pt was recommended to try vestibular therapy with specialized therapists to improve balance, decrease fall risk and decrease injury/re-injury. Pt and husband verbalized understanding.    Personal Factors and Comorbidities Comorbidity 3+;Age;Fitness;Past/Current Experience;Time since onset of  injury/illness/exacerbation    Comorbidities ADD, HTN, depression, anxiety, hx of concussion, memory deficits    Examination-Activity Limitations Bed Mobility;Bend;Locomotion Level;Stand;Stairs;Lift;Transfers    Stability/Clinical Decision Making --    Rehab Potential --    Clinical Impairments Affecting Rehab Potential memory, chronicity of condition, age, hx of  concussion    PT Frequency --    PT Duration --    PT Treatment/Interventions Therapeutic activities;Therapeutic exercise;Balance training;Neuromuscular re-education;Patient/family education;Manual techniques;Stair training;Functional mobility training    PT Next Visit Plan Continue HEP for back pain.    PT Home Exercise Plan Medbridge Access Code: [email or web address]    Consulted and Agree with Plan of Care Patient;Family member/caregiver    Family Member Consulted Husband                  Thank you for your referral.   Joneen Boers PT, DPT  07/20/2021, 5:52 PM

## 2021-07-25 ENCOUNTER — Ambulatory Visit: Payer: Medicare Other

## 2021-12-27 ENCOUNTER — Emergency Department
Admission: EM | Admit: 2021-12-27 | Discharge: 2021-12-27 | Disposition: A | Discharge status: Home / Self Care | Payer: Medicare Other | Attending: Emergency Medicine | Admitting: Emergency Medicine

## 2021-12-27 ENCOUNTER — Emergency Department: Payer: Medicare Other

## 2021-12-27 DIAGNOSIS — W19XXXA Unspecified fall, initial encounter: Secondary | ICD-10-CM

## 2021-12-27 DIAGNOSIS — W1839XA Other fall on same level, initial encounter: Secondary | ICD-10-CM | POA: Diagnosis not present

## 2021-12-27 DIAGNOSIS — F0390 Unspecified dementia without behavioral disturbance: Secondary | ICD-10-CM | POA: Diagnosis not present

## 2021-12-27 DIAGNOSIS — R410 Disorientation, unspecified: Secondary | ICD-10-CM | POA: Insufficient documentation

## 2021-12-27 DIAGNOSIS — M7989 Other specified soft tissue disorders: Secondary | ICD-10-CM | POA: Insufficient documentation

## 2021-12-27 LAB — URINALYSIS, ROUTINE W REFLEX MICROSCOPIC
Bacteria, UA: NONE SEEN
Bilirubin Urine: NEGATIVE
Glucose, UA: 50 mg/dL — AB
Hgb urine dipstick: NEGATIVE
Ketones, ur: 5 mg/dL — AB
Nitrite: NEGATIVE
Protein, ur: NEGATIVE mg/dL
Specific Gravity, Urine: 1.019 (ref 1.005–1.030)
pH: 5 (ref 5.0–8.0)

## 2021-12-27 LAB — COMPREHENSIVE METABOLIC PANEL
ALT: 12 U/L (ref 0–44)
AST: 17 U/L (ref 15–41)
Albumin: 3.9 g/dL (ref 3.5–5.0)
Alkaline Phosphatase: 77 U/L (ref 38–126)
Anion gap: 5 (ref 5–15)
BUN: 12 mg/dL (ref 8–23)
CO2: 24 mmol/L (ref 22–32)
Calcium: 9.4 mg/dL (ref 8.9–10.3)
Chloride: 111 mmol/L (ref 98–111)
Creatinine, Ser: 0.71 mg/dL (ref 0.44–1.00)
GFR, Estimated: >60 mL/min (ref >60)
Glucose, Bld: 153 mg/dL — ABNORMAL HIGH (ref 70–99)
Potassium: 3.9 mmol/L (ref 3.5–5.1)
Sodium: 140 mmol/L (ref 135–145)
Total Bilirubin: 0.5 mg/dL (ref 0.3–1.2)
Total Protein: 7.2 g/dL (ref 6.5–8.1)

## 2021-12-27 LAB — CBC
HCT: 36.3 % (ref 36.0–46.0)
Hemoglobin: 11.9 g/dL — ABNORMAL LOW (ref 12.0–15.0)
MCH: 27.5 pg (ref 26.0–34.0)
MCHC: 32.8 g/dL (ref 30.0–36.0)
MCV: 83.8 fL (ref 80.0–100.0)
Platelets: 200 10*3/uL (ref 150–400)
RBC: 4.33 MIL/uL (ref 3.87–5.11)
RDW: 13.5 % (ref 11.5–15.5)
WBC: 6.8 10*3/uL (ref 4.0–10.5)
nRBC: 0 % (ref 0.0–0.2)

## 2021-12-27 MED ORDER — FOSFOMYCIN TROMETHAMINE 3 G PO PACK
3.0000 g | PACK | Freq: Once | ORAL | Status: AC
  Administered 2021-12-27: 3 g via ORAL
  Filled 2021-12-27: qty 3

## 2021-12-27 NOTE — Discharge Instructions (Addendum)
Please contact neurology as we discussed and asked their opinion of whether or not to postpone the evaluation for the treatment of hydrocephalus.  Let them know what orthopedics is saying.  Personally I think that if they decide to go ahead that should be fine.  As I mentioned I am not an expert in either 1 of those fields however.  Please return if there is any further worsening of her condition.  We have given her the fosfomycin here that should help if there is a UTI.

## 2021-12-27 NOTE — ED Notes (Signed)
Assumed care of patient. Patient is in bed. Family is at bedside. No needs at this time.

## 2021-12-27 NOTE — ED Triage Notes (Signed)
Pt family sts that pt fell yesterday and than started to become confused. Pt has been more incontinent today and confused. Pt has hydrocephali's and family is going to many drs to figure out about shunt placement.

## 2021-12-27 NOTE — ED Provider Notes (Signed)
[facility] Provider Note    Event Date/Time   First MD Initiated Contact with Patient 12/27/21 Bosie Helper     (approximate)   History   Fall   HPI  Destiny Shaw is a 71 y.o. female with a history of dementia and normal pressure hydrocephalus.  She is supposed to go to [facility] shortly and be evaluated for shunt placement for the normal pressure hydrocephalus.  She fell yesterday and seems to become more confused and was now losing all of her urine at once instead of dribbling.  Patient says she can tell when she has to go but she has difficulty Getting out of bed and cannot make it to the bathroom in time so loses her urine.  She is also sitting up Edex for her knee replacement which has been bothering her somewhat.      Physical Exam   Triage Vital Signs: ED Triage Vitals [12/27/21 1728]  Enc Vitals Group     BP 127/81     Pulse Rate 83     Resp 18     Temp 98.1 F (36.7 C)     Temp Source Oral     SpO2 96 %     Weight      Height      Head Circumference      Peak Flow      Pain Score      Pain Loc      Pain Edu?      Excl. in War?     Most recent vital signs: Vitals:   12/27/21 1852 12/27/21 1918  BP: (!) 145/81   Pulse: 80   Resp: 18   Temp: 98.1 F (36.7 C)   SpO2: 95% 95%    General: Awake, no distress.  Coronally oriented making sense. Head normocephalic atraumatic Eyes pupils equal round extraocular movements intact fundi look normal I do not see any papilledema but is a difficult exam. Neck supple nontender CV:  Good peripheral perfusion.  Heart regular rate and rhythm no audible murmurs Resp:  Normal effort.  Lungs are clear Abd:  No distention.  Soft and nontender Extremities: There seems to be a small amount of swelling in the left foot but no tenderness on exam.  No tenderness in the ankle.  No tenderness on palpation of the entire knee.  There is no obvious effusion in the knee.   ED Results / Procedures / Treatments    Labs (all labs ordered are listed, but only abnormal results are displayed) Labs Reviewed  COMPREHENSIVE METABOLIC PANEL - Abnormal; Notable for the following components:      Result Value   Glucose, Bld 153 (*)    All other components within normal limits  CBC - Abnormal; Notable for the following components:   Hemoglobin 11.9 (*)    All other components within normal limits  URINALYSIS, ROUTINE W REFLEX MICROSCOPIC - Abnormal; Notable for the following components:   Color, Urine YELLOW (*)    APPearance CLEAR (*)    Glucose, UA 50 (*)    Ketones, ur 5 (*)    Leukocytes,Ua TRACE (*)    All other components within normal limits  URINALYSIS, ROUTINE W REFLEX MICROSCOPIC     EKG     RADIOLOGY Knee x-ray read by radiology question of some old injury to the left femoral condyle lateral 1.  There is however no tenderness bruising or swelling in that area. CT of the head and neck  read by radiology reviewed and interpreted by me shows signs of ventriculomegaly appearing similar to prior imaging. PROCEDURES:  Critical Care performed:   Procedures   MEDICATIONS ORDERED IN ED: Medications - No data to display   IMPRESSION / MDM / [place] / ED COURSE  I reviewed the triage vital signs and the nursing notes.   Differential diagnosis includes, but is not limited to, patient does have a UA consistent with UTI with white cells and leukocyte Estrace.  I will give her some fosfomycin to see if this will help with her urge incontinence.  I have urged the family to contact the neurologist and see what they think of orthopedics suggestion that the evaluation for shunting be postponed a few weeks till her knee improves.  They think that she is having some soft tissue strain that is orthopedics does.  Patient's presentation is most consistent with acute presentation with potential threat to life or bodily function.  No changes are seen on the CTs.  X-ray shows possible old  injury but no  tenderness on exam.  I will have them follow-up with neurology and orthopedics as they are doing. Clinical Course as of 12/27/21 2006  Wed Dec 27, 2021  1827 CT Cervical Spine Wo Contrast [SH]  4332 DG Knee Complete 4 Views Left [SH]    Clinical Course User Index [SH] Lonzo [place], [place]     FINAL CLINICAL IMPRESSION(S) / ED DIAGNOSES   Final diagnoses:  Fall, initial encounter     Rx / DC Orders   ED Discharge Orders     None        Note:  This document was prepared using Dragon voice recognition software and may include unintentional dictation errors.   Nena Polio, MD 12/27/21 2006

## 2021-12-27 NOTE — ED Provider Triage Note (Signed)
Emergency Medicine Provider Triage Evaluation Note  Destiny Shaw , a 71 y.o. female  was evaluated in triage.  Pt complains of cough, head injury, left knee injury, increased confusion since fall.  Patient states that her incontinence is also gotten worse.  Review of Systems  Positive:  Negative:   Physical Exam  BP 127/81 (BP Location: Right Arm)   Pulse 83   Temp 98.1 F (36.7 C) (Oral)   Resp 18   SpO2 96%  Gen:   Awake, no distress   Resp:  Normal effort  MSK:   Moves extremities without difficulty  Other:    Medical Decision Making  Medically screening exam initiated at 5:33 PM.  Appropriate orders placed.  Destiny Shaw was informed that the remainder of the evaluation will be completed by another provider, this initial triage assessment does not replace that evaluation, and the importance of remaining in the ED until their evaluation is complete.     Versie Starks, PA-C 12/27/21 1734

## 2021-12-27 NOTE — ED Notes (Signed)
Pt up to bathroom with asistance.  Family with pt.  Ua to lab.  No acute distress.

## 2022-03-19 ENCOUNTER — Encounter: Payer: Self-pay | Admitting: Urology

## 2022-03-19 ENCOUNTER — Ambulatory Visit: Payer: Medicare Other | Admitting: Urology

## 2022-03-19 VITALS — BP 170/74 | HR 71 | Ht 64.0 in | Wt 166.0 lb

## 2022-03-19 DIAGNOSIS — N390 Urinary tract infection, site not specified: Secondary | ICD-10-CM | POA: Diagnosis not present

## 2022-03-19 DIAGNOSIS — R32 Unspecified urinary incontinence: Secondary | ICD-10-CM

## 2022-03-19 DIAGNOSIS — N3941 Urge incontinence: Secondary | ICD-10-CM

## 2022-03-19 DIAGNOSIS — N3020 Other chronic cystitis without hematuria: Secondary | ICD-10-CM

## 2022-03-19 LAB — URINALYSIS, COMPLETE
Bilirubin, UA: NEGATIVE
Glucose, UA: NEGATIVE
Ketones, UA: NEGATIVE
Nitrite, UA: NEGATIVE
Specific Gravity, UA: >1.03 — ABNORMAL HIGH (ref 1.005–1.030)
Urobilinogen, Ur: 0.2 mg/dL (ref 0.2–1.0)
pH, UA: 5.5 (ref 5.0–7.5)

## 2022-03-19 LAB — MICROSCOPIC EXAMINATION
Epithelial Cells (non renal): >10 /hpf — AB (ref 0–10)
WBC, UA: >30 /hpf — AB (ref 0–5)

## 2022-03-19 NOTE — Patient Instructions (Signed)

## 2022-03-19 NOTE — Progress Notes (Signed)
In and Out Catheterization  Patient is present today for a I & O catheterization due to UTI. Patient was cleaned and prepped in a sterile fashion with betadine . A 14FR cath was inserted no complications were noted , 76m of urine return was noted, urine was yellow in color. A clean urine sample was collected for UCX. Bladder was drained  And catheter was removed with out difficulty.    Performed by: CElberta Leatherwood CMA  Follow up/ Additional notes: 6 weeks for Cysto

## 2022-03-19 NOTE — Progress Notes (Signed)
03/19/2022 2:58 PM   Destiny Shaw [birth year] [id number]  Referring provider: Kerry Dory, PA-C [place],  [place] [address]  Chief Complaint  Patient presents with   Urinary Incontinence    HPI: I was consulted to his past the patient for possible bladder infections.  She has dementia and her husband and daughter were helpful.  It appears that she was told she has infection from time to time but does not have a lot of symptoms.  Last October she was given fosfomycin when she went to the hospital and she had fallen  Some of the details of the history are challenging.  She voids every 2-3 hours and I believe does get up at night to urinate.  She wears a pad during the day sometimes as often dry and other times more wet.  She has bedwetting but not every night.  Sometimes it can be severe.  She has had a TIA and low back surgery.  She has not had a hysterectomy  No recent renal x-ray or culture in medical record  No history of kidney stones or bladder surgery.   PMH: Past Medical History:  Diagnosis Date   ADD (attention deficit disorder)    Anemia    Anginal pain ([place])    pt states has occas chest pain relates to indigestion; pt uses rest to relieve [facility] LOV-    Anxiety    Arthritis    Concussion    Depression    Diabetes mellitus without complication ([place])    type 2    Dizziness    Fall    GERD (gastroesophageal reflux disease)    Headache    History of urinary tract infection    Hyperlipidemia    Hypertension    IBS (irritable bowel syndrome)    Imbalance    Numbness    right leg    Numbness in both hands    comes and goes    Pneumonia    last episode approx 1 year ago    Sleep apnea    uses cpap not all the time    Wears glasses     Surgical History: Past Surgical History:  Procedure Laterality Date   BREAST CYST ASPIRATION Left 1980's   neg   BREAST CYST EXCISION Right 1980's   neg   BREAST LUMPECTOMY  Right    BREAST SURGERY     CARPAL TUNNEL RELEASE     CESAREAN SECTION     times 2   COLONOSCOPY WITH PROPOFOL N/A 05/11/2015   Procedure: COLONOSCOPY WITH PROPOFOL;  Surgeon: Manya Silvas, MD;  Location: [facility] ENDOSCOPY;  Service: Endoscopy;  Laterality: N/A;   DE QUERVAIN'S RELEASE Right    ESOPHAGOGASTRODUODENOSCOPY (EGD) WITH PROPOFOL N/A 05/11/2015   Procedure: ESOPHAGOGASTRODUODENOSCOPY (EGD) WITH PROPOFOL;  Surgeon: Manya Silvas, MD;  Location: [facility] ENDOSCOPY;  Service: Endoscopy;  Laterality: N/A;   EYE SURGERY     laser surgery bilat    HERNIA REPAIR     KNEE ARTHROSCOPY     LUMBAR LAMINECTOMY/DECOMPRESSION MICRODISCECTOMY Bilateral 09/14/2015   Procedure: MICRO LUMBAR BILATERAL DECOMPRESSION L4 - L5;  Surgeon: Susa Day, MD;  Location: [facility] ORS;  Service: Orthopedics;  Laterality: Bilateral;   REDUCTION MAMMAPLASTY Bilateral 1980   RHINOPLASTY     TONSILLECTOMY     TOTAL KNEE ARTHROPLASTY Left 06/18/2016   Procedure: LEFT TOTAL KNEE ARTHROPLASTY;  Surgeon: Paralee Cancel, MD;  Location: [facility] ORS;  Service: Orthopedics;  Laterality: Left;  Adductor Block   TOTAL KNEE ARTHROPLASTY Right 02/11/2020   Procedure: TOTAL KNEE ARTHROPLASTY;  Surgeon: Paralee Cancel, MD;  Location: [facility] ORS;  Service: Orthopedics;  Laterality: Right;   TUBAL LIGATION     UVULOPALATOPHARYNGOPLASTY      Home Medications:  Allergies as of 03/19/2022       Reactions   Levaquin [levofloxacin] Other (See Comments)   unknown   Statins Other (See Comments)   Tessalon [benzonatate] Hives   Latex Rash        Medication List        Accurate as of March 19, 2022  2:58 PM. If you have any questions, ask your nurse or doctor.          buPROPion 150 MG 12 hr tablet Commonly known as: WELLBUTRIN SR Take 150 mg by mouth 2 (two) times daily.   busPIRone 10 MG tablet Commonly known as: BUSPAR Take 10 mg by mouth 2 (two) times daily.   celecoxib 200 MG capsule Commonly known as: CELEBREX Take  200 mg by mouth daily.   cholecalciferol 25 MCG (1000 UNIT) tablet Commonly known as: VITAMIN D3 Take 1,000 Units by mouth daily.   docusate sodium 100 MG capsule Commonly known as: Colace Take 1 capsule (100 mg total) by mouth 2 (two) times daily.   ferrous sulfate 325 (65 FE) MG tablet Commonly known as: FerrouSul Take 1 tablet (325 mg total) by mouth 3 (three) times daily with meals for 14 days.   glipiZIDE 10 MG 24 hr tablet Commonly known as: GLUCOTROL XL Take 10 mg by mouth daily with breakfast.   HYDROcodone-acetaminophen 5-325 MG tablet Commonly known as: Norco Take 1-2 tablets by mouth every 6 (six) hours as needed for moderate pain or severe pain.   lisinopril 10 MG tablet Commonly known as: ZESTRIL Take 10 mg by mouth daily.   Melatonin 10 MG Tabs Take 10 mg by mouth at bedtime.   metFORMIN 500 MG tablet Commonly known as: GLUCOPHAGE Take 500 mg by mouth every evening.   methocarbamol 500 MG tablet Commonly known as: Robaxin Take 1 tablet (500 mg total) by mouth every 6 (six) hours as needed for muscle spasms.   metoprolol succinate 25 MG 24 hr tablet Commonly known as: TOPROL-XL Take 25 mg by mouth daily.   pioglitazone 15 MG tablet Commonly known as: ACTOS Take 15 mg by mouth daily.   polyethylene glycol 17 g packet Commonly known as: MIRALAX / GLYCOLAX Take 17 g by mouth 2 (two) times daily.   pravastatin 20 MG tablet Commonly known as: PRAVACHOL Take 20 mg by mouth every evening.        Allergies:  Allergies  Allergen Reactions   Levaquin [Levofloxacin] Other (See Comments)    unknown   Statins Other (See Comments)   Tessalon [Benzonatate] Hives   Latex Rash    Family History: History reviewed. No pertinent family history.  Social History:  reports that she has never smoked. She has never used smokeless tobacco. She reports that she does not drink alcohol and does not use drugs.  ROS:                                         Physical Exam: There were no vitals taken for this visit.  Constitutional:  Alert and oriented, No acute distress. HEENT:  AT, moist mucus membranes.  Trachea midline, no masses.   Laboratory Data: Lab Results  Component Value Date   WBC 6.8 12/27/2021   HGB 11.9 (L) 12/27/2021   HCT 36.3 12/27/2021   MCV 83.8 12/27/2021   PLT 200 12/27/2021    Lab Results  Component Value Date   CREATININE 0.71 12/27/2021    No results found for: "PSA"  No results found for: "TESTOSTERONE"  Lab Results  Component Value Date   HGBA1C 7.6 (H) 02/04/2020    Urinalysis    Component Value Date/Time   COLORURINE YELLOW (A) 12/27/2021 1820   APPEARANCEUR CLEAR (A) 12/27/2021 1820   LABSPEC 1.019 12/27/2021 1820   PHURINE 5.0 12/27/2021 1820   GLUCOSEU 50 (A) 12/27/2021 1820   HGBUR NEGATIVE 12/27/2021 1820   BILIRUBINUR NEGATIVE 12/27/2021 1820   KETONESUR 5 (A) 12/27/2021 1820   PROTEINUR NEGATIVE 12/27/2021 1820   NITRITE NEGATIVE 12/27/2021 1820   LEUKOCYTESUR TRACE (A) 12/27/2021 1820    Pertinent Imaging: Urine reviewed.  Will try to get a urine sent for culture.  Chart reviewed  Assessment & Plan: Patient is urinary tract infections.  She has bedwetting almost every night.  She has dementia.  History is little bit more challenging.  She does have some urge incontinence.  I think we need to have reasonable treatment goals.  The role of putting on urinary prophylaxis was discussed but I do not feel strongly about this.  Role of renal ultrasound and cystoscopy discussed and call of renal ultrasound abnormal  Also talked about the role of beta 3 agonist for bedwetting.  They want to think about prophylaxis and treating incontinence.  Either 1 or both can be treated.  If she is truly getting more infections and clinically apparent and putting on prophylaxis may help down regulate her bedwetting which is definitely increased since she fell over the last many months.   Lengthy discussion with family.  Call if culture positive.  1. Urinary incontinence, unspecified type  - Urinalysis, Complete   No follow-ups on file.  Reece Packer, MD  [facility] [address], [place], [place] [address] [phone]

## 2022-03-21 LAB — CULTURE, URINE COMPREHENSIVE

## 2022-04-17 ENCOUNTER — Ambulatory Visit
Admission: RE | Admit: 2022-04-17 | Discharge: 2022-04-17 | Disposition: A | Discharge status: Home / Self Care | Payer: Medicare Other | Source: Ambulatory Visit | Attending: Urology | Admitting: Urology

## 2022-04-17 DIAGNOSIS — N390 Urinary tract infection, site not specified: Secondary | ICD-10-CM

## 2022-04-30 ENCOUNTER — Encounter: Payer: Self-pay | Admitting: Urology

## 2022-04-30 ENCOUNTER — Ambulatory Visit (INDEPENDENT_AMBULATORY_CARE_PROVIDER_SITE_OTHER): Payer: Medicare Other | Admitting: Urology

## 2022-04-30 VITALS — BP 134/76 | HR 67 | Ht 64.0 in | Wt 167.0 lb

## 2022-04-30 DIAGNOSIS — R32 Unspecified urinary incontinence: Secondary | ICD-10-CM | POA: Diagnosis not present

## 2022-04-30 LAB — URINALYSIS, COMPLETE
Bilirubin, UA: NEGATIVE
Glucose, UA: NEGATIVE
Ketones, UA: NEGATIVE
Leukocytes,UA: NEGATIVE
Nitrite, UA: NEGATIVE
Protein,UA: NEGATIVE
RBC, UA: NEGATIVE
Specific Gravity, UA: 1.015 (ref 1.005–1.030)
Urobilinogen, Ur: 0.2 mg/dL (ref 0.2–1.0)
pH, UA: 5.5 (ref 5.0–7.5)

## 2022-04-30 LAB — MICROSCOPIC EXAMINATION

## 2022-04-30 NOTE — Progress Notes (Signed)
04/30/2022 1:33 PM   Destiny Shaw January 12, 1951 [id number]  Referring provider: Kirk Ruths, MD [place] [facility] [place],  [place] [address]  Chief Complaint  Patient presents with   Cysto    HPI: I was consulted to assess patient for possible bladder infections.  She has dementia and her husband and daughter were helpful.  It appears that she was told she has infection from time to time but does not have a lot of symptoms.  Last October she was given fosfomycin when she went to the hospital and she had fallen   Some of the details of the history are challenging.  She voids every 2-3 hours and I believe does get up at night to urinate.  She wears a pad during the day sometimes as often dry and other times more wet.  She has bedwetting but not every night.  Sometimes it can be severe.   She has had a TIA and low back surgery.  She has not had a hysterectomy   No recent renal x-ray or culture in medical record    Patient is urinary tract infections.  She has bedwetting almost every night.  She has dementia.  History is little bit more challenging.  She does have some urge incontinence.  I think we need to have reasonable treatment goals.  The role of putting on urinary prophylaxis was discussed but I do not feel strongly about this.  Role of renal ultrasound and cystoscopy discussed and call of renal ultrasound abnormal   Also talked about the role of beta 3 agonist for bedwetting.  They want to think about prophylaxis and treating incontinence.  Either 1 or both can be treated.  If she is truly getting more infections and clinically apparent and putting on prophylaxis may help down regulate her bedwetting which is definitely increased since she fell over the last many months.  Lengthy discussion with family.  Call if culture positive.  Today Frequency stable and last culture negative On pelvic examination patient no stress incontinence and no significant  prolapse Ultrasound normal Cystoscopy: Patient underwent flexible cystoscopy.  Bladder mucosa and trigone were normal.  No carcinoma.  No cystitis.  Urine was aspirated and sent for culture    PMH: Past Medical History:  Diagnosis Date   ADD (attention deficit disorder)    Anemia    Anginal pain ([place])    pt states has occas chest pain relates to indigestion; pt uses rest to relieve [facility] LOV-    Anxiety    Arthritis    Concussion    Depression    Diabetes mellitus without complication ([place])    type 2    Dizziness    Fall    GERD (gastroesophageal reflux disease)    Headache    History of urinary tract infection    Hyperlipidemia    Hypertension    IBS (irritable bowel syndrome)    Imbalance    Numbness    right leg    Numbness in both hands    comes and goes    Pneumonia    last episode approx 1 year ago    Sleep apnea    uses cpap not all the time    Wears glasses     Surgical History: Past Surgical History:  Procedure Laterality Date   BREAST CYST ASPIRATION Left 1980's   neg   BREAST CYST EXCISION Right 1980's   neg  BREAST LUMPECTOMY Right    BREAST SURGERY     CARPAL TUNNEL RELEASE     CESAREAN SECTION     times 2   COLONOSCOPY WITH PROPOFOL N/A 05/11/2015   Procedure: COLONOSCOPY WITH PROPOFOL;  Surgeon: Manya Silvas, MD;  Location: [facility] ENDOSCOPY;  Service: Endoscopy;  Laterality: N/A;   DE QUERVAIN'S RELEASE Right    ESOPHAGOGASTRODUODENOSCOPY (EGD) WITH PROPOFOL N/A 05/11/2015   Procedure: ESOPHAGOGASTRODUODENOSCOPY (EGD) WITH PROPOFOL;  Surgeon: Manya Silvas, MD;  Location: [facility] ENDOSCOPY;  Service: Endoscopy;  Laterality: N/A;   EYE SURGERY     laser surgery bilat    HERNIA REPAIR     KNEE ARTHROSCOPY     LUMBAR LAMINECTOMY/DECOMPRESSION MICRODISCECTOMY Bilateral 09/14/2015   Procedure: MICRO LUMBAR BILATERAL DECOMPRESSION L4 - L5;  Surgeon: Susa Day, MD;  Location: [facility];  Service: Orthopedics;  Laterality:  Bilateral;   REDUCTION MAMMAPLASTY Bilateral 1980   RHINOPLASTY     TONSILLECTOMY     TOTAL KNEE ARTHROPLASTY Left 06/18/2016   Procedure: LEFT TOTAL KNEE ARTHROPLASTY;  Surgeon: Paralee Cancel, MD;  Location: [facility]S;  Service: Orthopedics;  Laterality: Left;  Adductor Block   TOTAL KNEE ARTHROPLASTY Right 02/11/2020   Procedure: TOTAL KNEE ARTHROPLASTY;  Surgeon: Paralee Cancel, MD;  Location: [facility];  Service: Orthopedics;  Laterality: Right;   TUBAL LIGATION     UVULOPALATOPHARYNGOPLASTY      Home Medications:  Allergies as of 04/30/2022       Reactions   Levaquin [levofloxacin] Other (See Comments)   unknown   Statins Other (See Comments)   Tessalon [benzonatate] Hives   Latex Rash        Medication List        Accurate as of April 30, 2022  1:33 PM. If you have any questions, ask your nurse or doctor.          buPROPion 150 MG 12 hr tablet Commonly known as: WELLBUTRIN SR Take 150 mg by mouth 2 (two) times daily.   busPIRone 10 MG tablet Commonly known as: BUSPAR Take 10 mg by mouth 2 (two) times daily.   celecoxib 200 MG capsule Commonly known as: CELEBREX Take 200 mg by mouth daily.   cholecalciferol 25 MCG (1000 UNIT) tablet Commonly known as: VITAMIN D3 Take 1,000 Units by mouth daily.   glipiZIDE 10 MG 24 hr tablet Commonly known as: GLUCOTROL XL Take 10 mg by mouth daily with breakfast.   HYDROcodone-acetaminophen 5-325 MG tablet Commonly known as: Norco Take 1-2 tablets by mouth every 6 (six) hours as needed for moderate pain or severe pain.   Melatonin 10 MG Tabs Take 10 mg by mouth at bedtime.   memantine 5 MG tablet Commonly known as: NAMENDA Take 5 mg by mouth 2 (two) times daily.   metFORMIN 500 MG tablet Commonly known as: GLUCOPHAGE Take 500 mg by mouth every evening.   metoprolol succinate 25 MG 24 hr tablet Commonly known as: TOPROL-XL Take 25 mg by mouth daily.   pravastatin 20 MG tablet Commonly known as: PRAVACHOL Take 20 mg  by mouth every evening.        Allergies:  Allergies  Allergen Reactions   Levaquin [Levofloxacin] Other (See Comments)    unknown   Statins Other (See Comments)   Tessalon [Benzonatate] Hives   Latex Rash    Family History: No family history on file.  Social History:  reports that she has never smoked. She has never been exposed to tobacco smoke. She  has never used smokeless tobacco. She reports that she does not drink alcohol and does not use drugs.  ROS:                                        Physical Exam: There were no vitals taken for this visit.  Constitutional:  Alert and oriented, No acute distress. HEENT: [place] AT, moist mucus membranes.  Trachea midline, no masses.   Laboratory Data: Lab Results  Component Value Date   WBC 6.8 12/27/2021   HGB 11.9 (L) 12/27/2021   HCT 36.3 12/27/2021   MCV 83.8 12/27/2021   PLT 200 12/27/2021    Lab Results  Component Value Date   CREATININE 0.71 12/27/2021    No results found for: "PSA"  No results found for: "TESTOSTERONE"  Lab Results  Component Value Date   HGBA1C 7.6 (H) 02/04/2020    Urinalysis    Component Value Date/Time   COLORURINE YELLOW (A) 12/27/2021 1820   APPEARANCEUR Hazy (A) 03/19/2022 1500   LABSPEC 1.019 12/27/2021 1820   PHURINE 5.0 12/27/2021 1820   GLUCOSEU Negative 03/19/2022 1500   HGBUR NEGATIVE 12/27/2021 1820   BILIRUBINUR Negative 03/19/2022 1500   KETONESUR 5 (A) 12/27/2021 1820   PROTEINUR 1+ (A) 03/19/2022 1500   PROTEINUR NEGATIVE 12/27/2021 1820   NITRITE Negative 03/19/2022 1500   NITRITE NEGATIVE 12/27/2021 1820   LEUKOCYTESUR 2+ (A) 03/19/2022 1500   LEUKOCYTESUR TRACE (A) 12/27/2021 1820    Pertinent Imaging:   Assessment & Plan: Currently not enough evidence put her on urinary prophylaxis.  Role of beta 3 agonist discussed.  It is incontinence much better in the last month.  Little to no bedwetting.  If things worsen he will call and we  will send a urine for culture to rule out an occult urinary tract infection.  Otherwise I will see her as needed  1. Urinary incontinence, unspecified type  - Urinalysis, Complete   No follow-ups on file.  Reece Packer, MD  [facility] [address] [place], [place] [address] [phone]

## 2022-05-03 LAB — CULTURE, URINE COMPREHENSIVE

## 2022-05-08 ENCOUNTER — Telehealth: Payer: Self-pay | Admitting: Family Medicine

## 2022-05-08 NOTE — Telephone Encounter (Signed)
Patient's daughter called and states her mother has been having urinary incontinence for a couple days. She states her mom has been very constipated and unable to urinate for a few days. I informed her that constipation can push on the bladder and cause incontinence. Her daughter states she going to try an enema and come pick up samples and if the incontinence continues she will start her on the medication. I have put Myrbetriq '50mg'$  up front for her to pick up.

## 2022-06-27 ENCOUNTER — Other Ambulatory Visit: Payer: Self-pay | Admitting: *Deleted

## 2022-06-27 MED ORDER — MIRABEGRON ER 50 MG PO TB24: 50.0000 mg | ORAL_TABLET | Freq: Every day | ORAL | 11 refills | Status: DC

## 2022-06-28 ENCOUNTER — Telehealth: Payer: Self-pay | Admitting: *Deleted

## 2022-06-28 NOTE — Telephone Encounter (Signed)
Pt calling asking for an alternative to myrbetriq, per pt it works just fine, the cost is over $700.00. please advise

## 2022-07-02 MED ORDER — OXYBUTYNIN CHLORIDE ER 10 MG PO TB24: 10.0000 mg | ORAL_TABLET | Freq: Every day | ORAL | 1 refills | Status: DC

## 2022-07-02 NOTE — Telephone Encounter (Signed)
Spoke with patient's son and advised results. rx sent to pharmacy by e-script

## 2022-07-09 ENCOUNTER — Emergency Department: Payer: Medicare Other

## 2022-07-09 ENCOUNTER — Emergency Department
Admission: EM | Admit: 2022-07-09 | Discharge: 2022-07-09 | Discharge status: Against Medical Advice | Payer: Medicare Other | Attending: Emergency Medicine | Admitting: Emergency Medicine

## 2022-07-09 ENCOUNTER — Other Ambulatory Visit: Payer: Self-pay

## 2022-07-09 DIAGNOSIS — W1830XA Fall on same level, unspecified, initial encounter: Secondary | ICD-10-CM | POA: Diagnosis not present

## 2022-07-09 DIAGNOSIS — Z5321 Procedure and treatment not carried out due to patient leaving prior to being seen by health care provider: Secondary | ICD-10-CM | POA: Diagnosis not present

## 2022-07-09 DIAGNOSIS — S0083XA Contusion of other part of head, initial encounter: Secondary | ICD-10-CM | POA: Diagnosis not present

## 2022-07-09 LAB — CBG MONITORING, ED: Glucose-Capillary: 144 mg/dL — ABNORMAL HIGH (ref 70–99)

## 2022-07-09 LAB — CBC
HCT: 35.8 % — ABNORMAL LOW (ref 36.0–46.0)
Hemoglobin: 11.6 g/dL — ABNORMAL LOW (ref 12.0–15.0)
MCH: 28.3 pg (ref 26.0–34.0)
MCHC: 32.4 g/dL (ref 30.0–36.0)
MCV: 87.3 fL (ref 80.0–100.0)
Platelets: 120 10*3/uL — ABNORMAL LOW (ref 150–400)
RBC: 4.1 MIL/uL (ref 3.87–5.11)
RDW: 13.6 % (ref 11.5–15.5)
WBC: 3.1 10*3/uL — ABNORMAL LOW (ref 4.0–10.5)
nRBC: 0 % (ref 0.0–0.2)

## 2022-07-09 LAB — BASIC METABOLIC PANEL
Anion gap: 9 (ref 5–15)
BUN: 15 mg/dL (ref 8–23)
CO2: 21 mmol/L — ABNORMAL LOW (ref 22–32)
Calcium: 9 mg/dL (ref 8.9–10.3)
Chloride: 107 mmol/L (ref 98–111)
Creatinine, Ser: 0.71 mg/dL (ref 0.44–1.00)
GFR, Estimated: >60 mL/min (ref >60)
Glucose, Bld: 105 mg/dL — ABNORMAL HIGH (ref 70–99)
Potassium: 4 mmol/L (ref 3.5–5.1)
Sodium: 137 mmol/L (ref 135–145)

## 2022-07-09 NOTE — ED Triage Notes (Signed)
Pt to ED with family for multiple falls this week. Pt reports feeling tired. PT reports pt is leaning to left when trying to walk. Pt oriented to person only.  Bruising noted to forehead.

## 2022-07-24 ENCOUNTER — Other Ambulatory Visit: Payer: Self-pay | Admitting: Student

## 2022-07-24 DIAGNOSIS — R413 Other amnesia: Secondary | ICD-10-CM

## 2022-07-25 ENCOUNTER — Ambulatory Visit
Admission: RE | Admit: 2022-07-25 | Discharge: 2022-07-25 | Disposition: A | Discharge status: Home / Self Care | Payer: Medicare Other | Source: Ambulatory Visit | Attending: Student | Admitting: Student

## 2022-07-25 ENCOUNTER — Telehealth: Payer: Self-pay

## 2022-07-25 DIAGNOSIS — R413 Other amnesia: Secondary | ICD-10-CM | POA: Diagnosis present

## 2022-07-25 NOTE — Telephone Encounter (Signed)
Call left on triage line - daughter Florentina Addison  Per pts daughter pt was rxed oxybutynin on 5/2 b/c the myrbetriq was too expensive.  Pt was taking oxybutynin for 2 weeks and experienced falls and no relief from her incontinence. She stopped the oxybutynin. Given Macrobid for UTI per pcp. Falls are better. Incontinence a little better.   Daughter would like to know should she restart Oxybutynin or is there something else she can take. Unsure if oxybutynin contributed to her falls. Genny aware it will be Monday before Macd[place]id receives this message.   Pls advise.

## 2022-07-27 NOTE — Telephone Encounter (Signed)
Genny aware.  Appt made for 6/3 at 3pm.   Florentina Addison appreciative of call.

## 2022-07-30 ENCOUNTER — Ambulatory Visit (INDEPENDENT_AMBULATORY_CARE_PROVIDER_SITE_OTHER): Payer: Medicare Other | Admitting: Physician Assistant

## 2022-07-30 ENCOUNTER — Encounter: Payer: Self-pay | Admitting: Physician Assistant

## 2022-07-30 VITALS — BP 150/74 | HR 61 | Ht 64.0 in | Wt 160.0 lb

## 2022-07-30 DIAGNOSIS — R32 Unspecified urinary incontinence: Secondary | ICD-10-CM

## 2022-07-30 MED ORDER — GEMTESA 75 MG PO TABS
75.0000 mg | ORAL_TABLET | Freq: Every day | ORAL | 0 refills | Status: DC
Start: 2022-07-30 — End: 2022-08-28

## 2022-07-30 NOTE — Patient Instructions (Signed)
If Gemtesa doesn't work as well as Programme researcher, broadcasting/film/video did, call me in clinic and we will switch you back to Myrbetriq, sending it to the cheapest local pharmacy we can find. If the [name] works well and you want to stay on it, call our clinic when you are running low on samples so we can set more aside for you.

## 2022-07-30 NOTE — Progress Notes (Signed)
07/30/2022 2:26 PM   Destiny Shaw February 26, 1951 [id number]  CC: Chief Complaint  Patient presents with   Other    Discuss medication    HPI: Destiny Shaw is a 72 y.o. 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  She is accompanied today by her daughter and husband, who contribute to HPI.  She was previously on Myrbetriq for her urinary incontinence, and it worked well, however she stopped it because it was cost prohibitive.  She was switched to oxybutynin and then had increased falls and dry eye, so they stopped the oxybutynin because they were concerned that this could be contributing.  Notably, her PCP recently treated her for acute cystitis with Macrobid and she has had decreased urinary incontinence since.  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  PMH: Past Medical History:  Diagnosis Date   ADD (attention deficit disorder)    Anemia    Anginal pain (HCC)    pt states has occas chest pain relates to indigestion; pt uses rest to relieve [facility] LOV-    Anxiety    Arthritis    Concussion    Depression    Diabetes mellitus without complication (HCC)    type 2    Dizziness    Fall    GERD (gastroesophageal reflux disease)    Headache    History of urinary tract infection    Hyperlipidemia    Hypertension    IBS (irritable bowel syndrome)    Imbalance    Numbness    right leg    Numbness in both hands    comes and goes    Pneumonia    last episode approx 1 year ago    Sleep apnea    uses cpap not all the time    Wears glasses     Surgical History: Past Surgical History:  Procedure Laterality Date   BREAST CYST ASPIRATION Left 1980's   neg   BREAST CYST EXCISION Right 1980's   neg   BREAST LUMPECTOMY  Right    BREAST SURGERY     CARPAL TUNNEL RELEASE     CESAREAN SECTION     times 2   COLONOSCOPY WITH PROPOFOL N/A 05/11/2015   Procedure: COLONOSCOPY WITH PROPOFOL;  Surgeon: Scot Jun, MD;  Location: [facility] ENDOSCOPY;  Service: Endoscopy;  Laterality: N/A;   DE QUERVAIN'S RELEASE Right    ESOPHAGOGASTRODUODENOSCOPY (EGD) WITH PROPOFOL N/A 05/11/2015   Procedure: ESOPHAGOGASTRODUODENOSCOPY (EGD) WITH PROPOFOL;  Surgeon: Scot Jun, MD;  Location: [facility] ENDOSCOPY;  Service: Endoscopy;  Laterality: N/A;   EYE SURGERY     laser surgery bilat    HERNIA REPAIR     KNEE ARTHROSCOPY     LUMBAR LAMINECTOMY/DECOMPRESSION MICRODISCECTOMY Bilateral 09/14/2015   Procedure: MICRO LUMBAR BILATERAL DECOMPRESSION L4 - L5;  Surgeon: Jene Every, MD;  Location: [facility] ORS;  Service: Orthopedics;  Laterality: Bilateral;   REDUCTION MAMMAPLASTY Bilateral 1980   RHINOPLASTY     TONSILLECTOMY     TOTAL KNEE ARTHROPLASTY Left 06/18/2016   Procedure: LEFT TOTAL KNEE ARTHROPLASTY;  Surgeon: Durene Romans, MD;  Location: [facility] ORS;  Service: Orthopedics;  Laterality: Left;  Adductor Block   TOTAL KNEE ARTHROPLASTY Right 02/11/2020   Procedure: TOTAL KNEE ARTHROPLASTY;  Surgeon: Durene Romans, MD;  Location: [facility];  Service: Orthopedics;  Laterality: Right;   TUBAL LIGATION     UVULOPALATOPHARYNGOPLASTY      Home Medications:  Allergies as of 07/30/2022       Reactions   Levaquin [levofloxacin] Other (See Comments)   unknown   Statins Other (See Comments)   Tessalon [benzonatate] Hives   Latex Rash        Medication List        Accurate as of July 30, 2022  2:26 PM. If you have any questions, ask your nurse or doctor.          buPROPion 150 MG 12 hr tablet Commonly known as: WELLBUTRIN SR Take 150 mg by mouth 2 (two) times daily.   busPIRone 10 MG tablet Commonly known as: BUSPAR Take 10 mg by mouth 2 (two) times daily.   celecoxib 200 MG capsule Commonly known as: CELEBREX Take 200  mg by mouth daily.   cholecalciferol 25 MCG (1000 UNIT) tablet Commonly known as: VITAMIN D3 Take 1,000 Units by mouth daily.   glipiZIDE 10 MG 24 hr tablet Commonly known as: GLUCOTROL XL Take 10 mg by mouth daily with breakfast.   HYDROcodone-acetaminophen 5-325 MG tablet Commonly known as: Norco Take 1-2 tablets by mouth every 6 (six) hours as needed for moderate pain or severe pain.   Melatonin 10 MG Tabs Take 10 mg by mouth at bedtime.   memantine 5 MG tablet Commonly known as: NAMENDA Take 5 mg by mouth 2 (two) times daily.   metFORMIN 500 MG tablet Commonly known as: GLUCOPHAGE Take 500 mg by mouth every evening.   metoprolol succinate 25 MG 24 hr tablet Commonly known as: TOPROL-XL Take 25 mg by mouth daily.   oxybutynin 10 MG 24 hr tablet Commonly known as: DITROPAN-XL Take 1 tablet (10 mg total) by mouth at bedtime.   pravastatin 20 MG tablet Commonly known as: PRAVACHOL Take 20 mg by mouth every evening.        Allergies:  Allergies  Allergen Reactions   Levaquin [Levofloxacin] Other (See Comments)    unknown   Statins Other (See Comments)   Tessalon [Benzonatate] Hives   Latex Rash    Family History: No family history on file.  Social History:   reports that she has never smoked. She has never been exposed to tobacco smoke. She has never used smokeless tobacco. She reports that she does not drink alcohol and does not use drugs.  Physical Exam: BP (!) 150/74   Pulse 61   Ht 5\' 4"  (1.626 m)   Wt 160 lb (72.6 kg)   BMI 27.46 kg/m   Constitutional:  Alert and oriented, no acute distress, nontoxic appearing HEENT: [place], AT Cardiovascular: No clubbing, cyanosis, or edema Respiratory: Normal respiratory effort, no increased work of breathing Skin: No rashes, bruises or suspicious lesions Neurologic: Grossly intact, no focal deficits, moving all 4 extremities Psychiatric: Normal mood and affect  Assessment & Plan:   1. Urinary incontinence,  unspecified type Anticholinergics are contraindicated in this patient with dementia.  I am not convinced that the oxybutynin contributed to her recent falls, and think underlying possible NPH could be playing a larger role, however I do not believe the oxybutynin is serving her well and could be contributing to her confusion.  Ultimately I think it would be safest to put her back on a beta 3 agonist.  We discussed that Myrbetriq is newly available generic, however with her insurance it still looks like it is  going to be many 100s of dollars per month.  Will have her try Gemtesa and see if this works better for her, at which point we could keep her on samples of Gemtesa due to poor insurance coverage of this med as well.  If she finds that Myrbetriq worked better for her, we can try sending it to the least expensive local pharmacy for a generic prescription. - Vibegron (GEMTESA) 75 MG TABS; Take 1 tablet (75 mg total) by mouth daily.  Dispense: 42 tablet; Refill: 0   Return if symptoms worsen or fail to improve.  Carman Ching, PA-C  [facility] [place] [address], [address] [place], [place] [address] [phone]

## 2022-08-09 IMAGING — MR MR HEAD W/O CM
12 series · 47 of 48 positions shown · non-contrast
Comparison: 04/13/2017

CLINICAL DATA: Concussion in the past year. Headache, dizziness,
and memory loss for several months. Frequent falls

EXAM:
MRI HEAD WITHOUT CONTRAST
TECHNIQUE: Multiplanar, multiecho pulse sequences of the brain and surrounding
structures were obtained without intravenous contrast.

[Series 5: ax dwi_tracew · axial · 3.0mm · 0.65mm/px · z∈[-90,+62]mm · 6 of 96 slices shown]
[im 1/96]
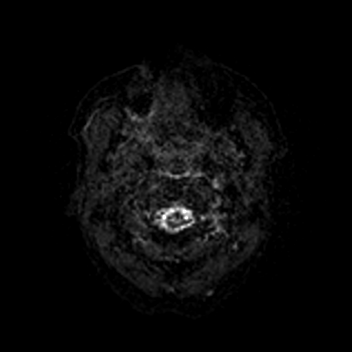
[im 20/96]
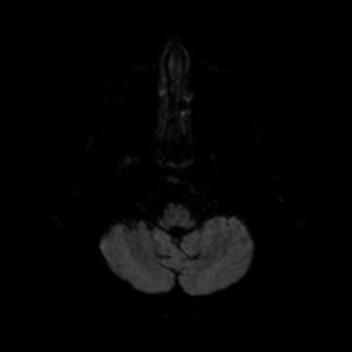
[im 39/96]
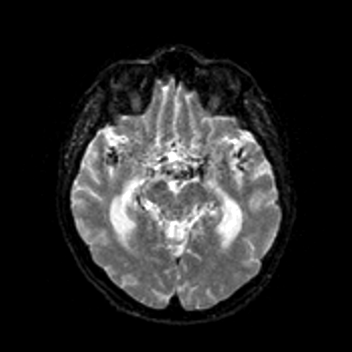
[im 58/96]
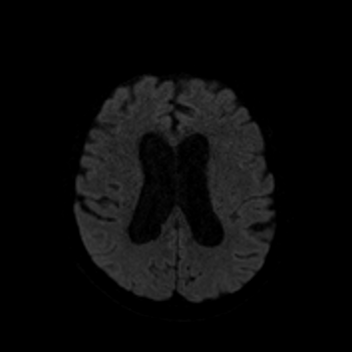
[im 77/96]
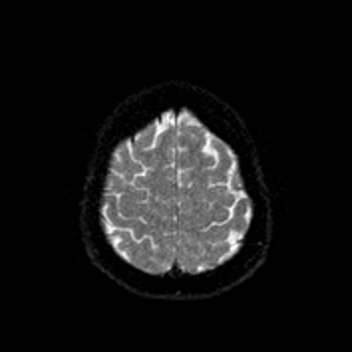
[im 96/96]
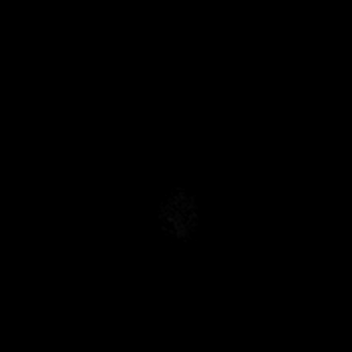

[Series 6: ax dwi_adc · axial · 3.0mm · 0.65mm/px · z∈[-90,+62]mm · 3 of 48 slices shown]
[im 1/48]
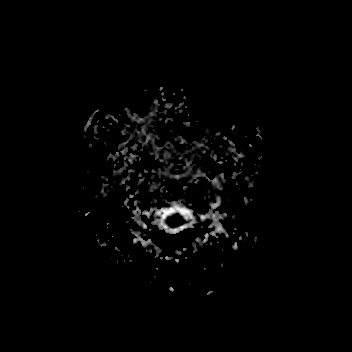
[im 24/48]
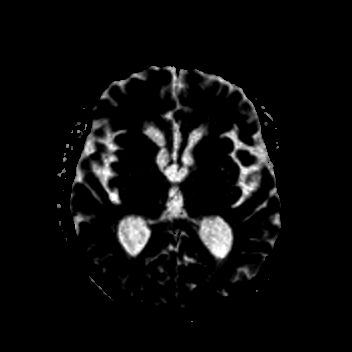
[im 48/48]
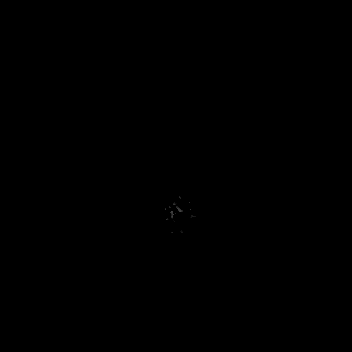

[Series 7: cor dwi_tracew · coronal · 5.0mm · 0.65mm/px · 3 of 40 slices shown]
[im 1/40]
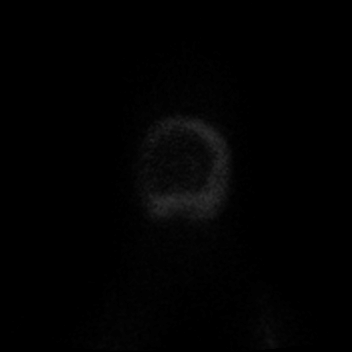
[im 20/40]
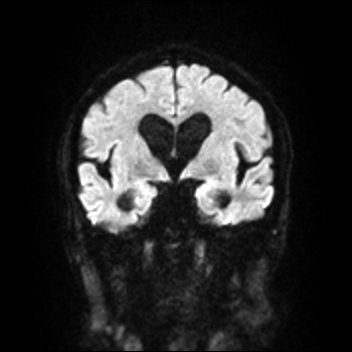
[im 40/40]
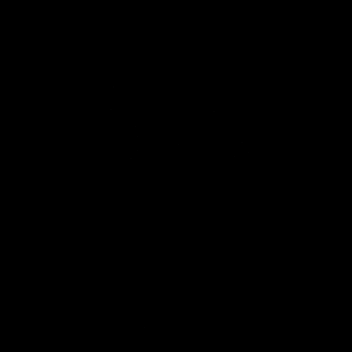

[Series 8: cor dwi_adc · coronal · 5.0mm · 0.65mm/px · 2 of 34 slices shown]
[im 1/34]
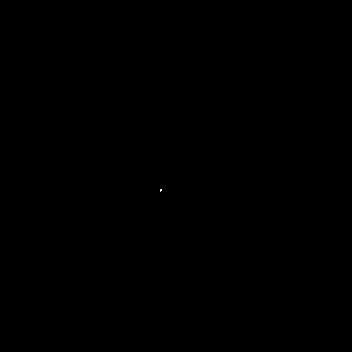
[im 34/34]
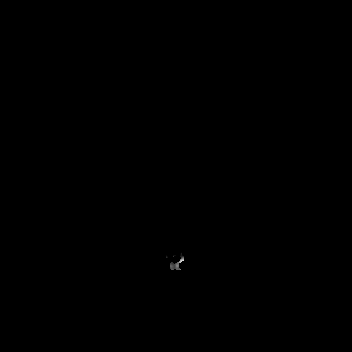

[Series 9: T1 · sagittal · 5.0mm · 0.62mm/px · 2 of 25 slices shown (1 of 2)]
[im 1/25]
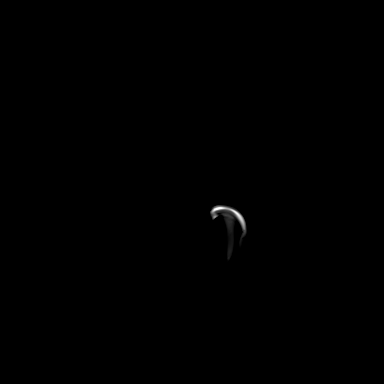
[im 25/25]
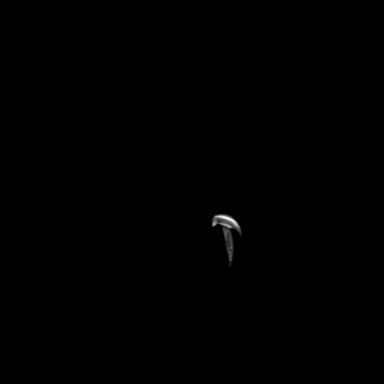

[Series 10: T2 · axial · 5.0mm · 0.53mm/px · z∈[-86,+56]mm · 2 of 25 slices shown (1 of 2)]
[im 1/25]
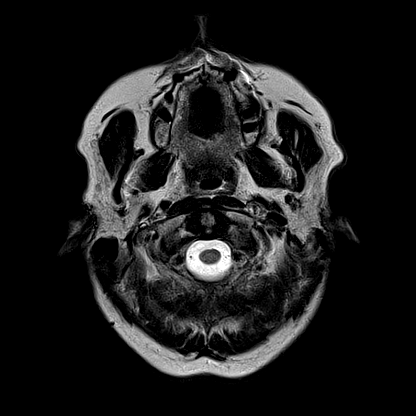
[im 25/25]
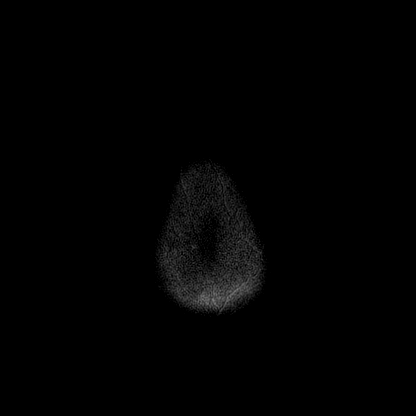

[Series 11: mag_images · axial · 3.0mm · 0.90mm/px · z∈[-101,+73]mm · 4 of 60 slices shown]
[im 1/60]
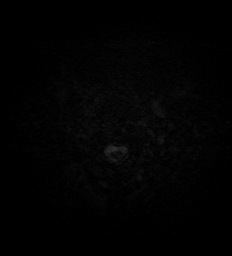
[im 20/60]
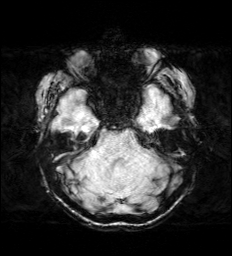
[im 40/60]
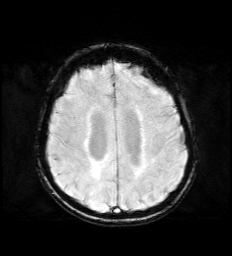
[im 60/60]
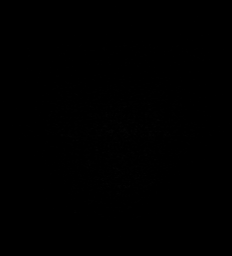

[Series 12: pha_images · axial · 3.0mm · 0.90mm/px · z∈[-101,+73]mm · 4 of 58 slices shown]
[im 1/58]
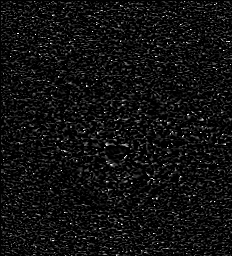
[im 20/58]
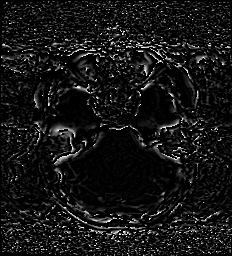
[im 39/58]
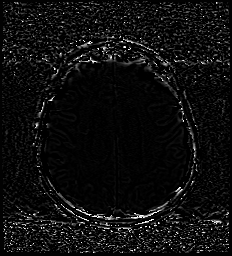
[im 58/58]
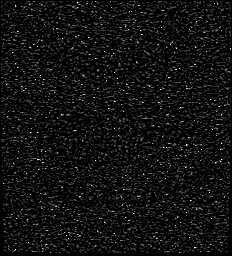

[Series 13: swi_images · axial · 3.0mm · 0.90mm/px · z∈[-101,+73]mm · 4 of 60 slices shown]
[im 1/60]
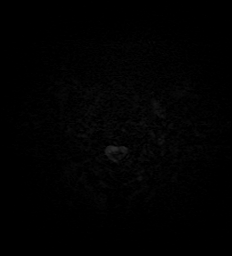
[im 20/60]
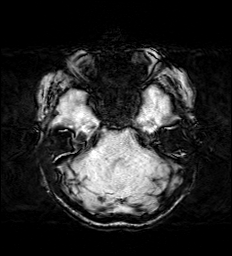
[im 40/60]
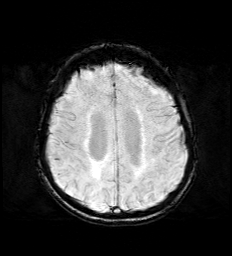
[im 60/60]
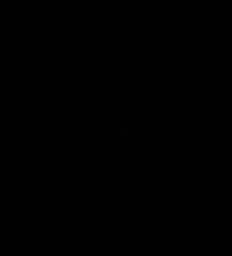

[Series 15: FLAIR · axial · 3.0mm · 0.53mm/px · z∈[-94,+65]mm · 4 of 55 slices shown]
[im 1/55]
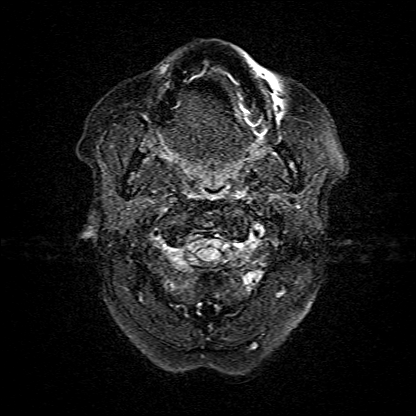
[im 19/55]
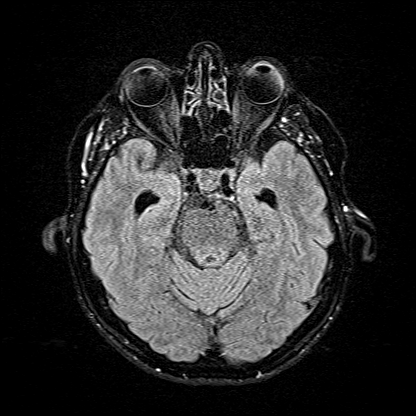
[im 37/55]
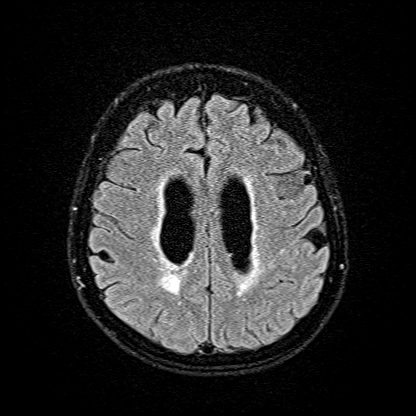
[im 55/55]
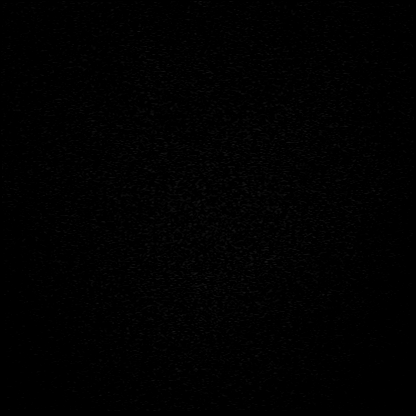

[Series 16: T1 · axial · 1.0mm · 0.98mm/px · z∈[-98,+74]mm · 11 of 176 slices shown (2 of 2)]
[im 1/176]
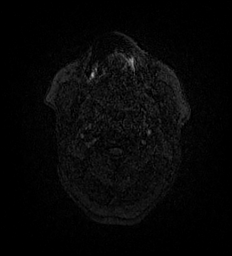
[im 16/176]
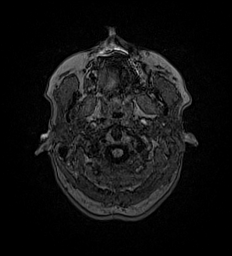
[im 32/176]
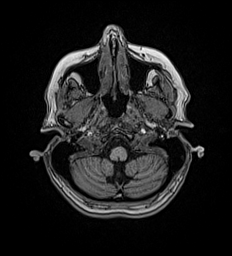
[im 48/176]
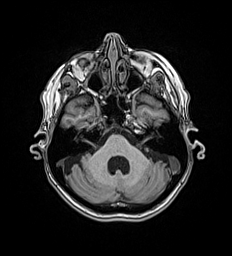
[im 64/176]
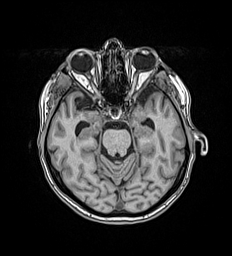
[im 80/176]
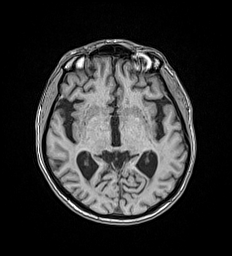
[im 96/176]
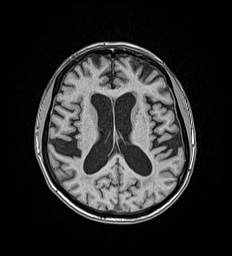
[im 112/176]
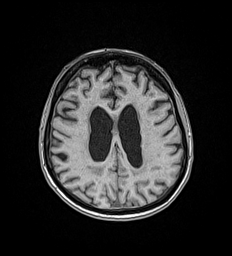
[im 128/176]
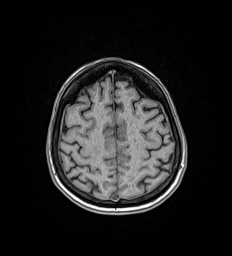
[im 144/176]
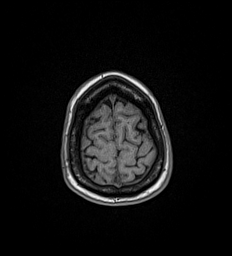
[im 176/176]
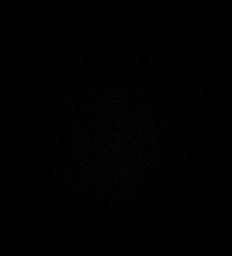

[Series 17: T2 · coronal · 5.0mm · 0.57mm/px · 2 of 29 slices shown (2 of 2)]
[im 1/29]
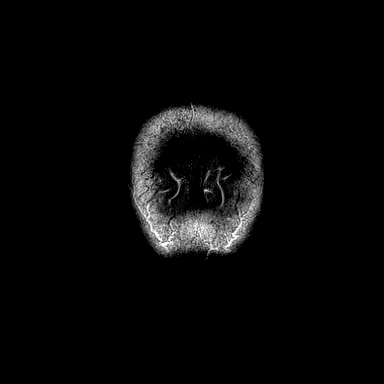
[im 29/29]
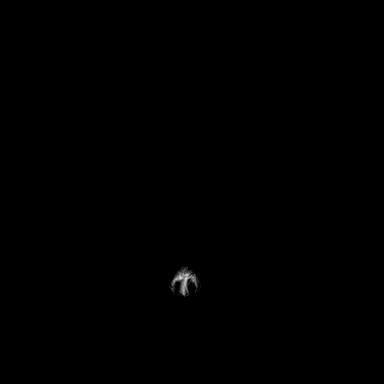

[47 of 48 positions shown; findings below may reference images not displayed]

FINDINGS: Brain: Generalized ventriculomegaly with ballooned appearance of the
lateral ventricles which have some callosum angle narrowing and have
an up lifted appearance of the corpus callosum on coronal images.
Sylvian fissures certainly contain more CSF than the sulci the
vertex. Ventriculomegaly has increased when measuring the lateral
ventricles on coronal T2 weighted imaging. Mild periventricular
chronic small vessel ischemia. No acute or subacute infarct,
hemorrhage, obstructive hydrocephalus, or collection.

Vascular: Normal flow voids.

Skull and upper cervical spine: Normal marrow signal.

Sinuses/Orbits: Negative.
IMPRESSION: Progressed ventriculomegaly with some features seen in communicating
hydrocephalus, consider NPH.

## 2022-08-28 ENCOUNTER — Encounter: Payer: Self-pay | Admitting: Physician Assistant

## 2022-08-28 ENCOUNTER — Ambulatory Visit (INDEPENDENT_AMBULATORY_CARE_PROVIDER_SITE_OTHER): Payer: Medicare Other | Admitting: Physician Assistant

## 2022-08-28 VITALS — BP 118/73 | HR 68 | Wt 160.0 lb

## 2022-08-28 DIAGNOSIS — R32 Unspecified urinary incontinence: Secondary | ICD-10-CM | POA: Diagnosis not present

## 2022-08-28 LAB — URINALYSIS, COMPLETE
Bilirubin, UA: NEGATIVE
Glucose, UA: NEGATIVE
Ketones, UA: NEGATIVE
Leukocytes,UA: NEGATIVE
Nitrite, UA: NEGATIVE
Protein,UA: NEGATIVE
RBC, UA: NEGATIVE
Specific Gravity, UA: 1.01 (ref 1.005–1.030)
Urobilinogen, Ur: 0.2 mg/dL (ref 0.2–1.0)
pH, UA: 5 (ref 5.0–7.5)

## 2022-08-28 LAB — MICROSCOPIC EXAMINATION

## 2022-08-28 MED ORDER — MIRABEGRON ER 50 MG PO TB24
50.0000 mg | ORAL_TABLET | Freq: Every day | ORAL | 11 refills | Status: DC
Start: 2022-08-28 — End: 2022-08-31

## 2022-08-28 NOTE — Progress Notes (Signed)
In and Out Catheterization  Patient is present today for a I & O catheterization due to urinary issues. Patient was cleaned and prepped in a sterile fashion with betadine . A 14 FR cath was inserted no complications were noted , 280 ml of urine return was noted, urine was yellow in color. A clean urine sample was collected for urinalysis complete. Bladder was drained  And catheter was removed with out difficulty.    Performed by: [place] Pink RMA  Follow up/ Additional notes: f/up as scheduled

## 2022-08-28 NOTE — Progress Notes (Signed)
08/28/2022 1:04 PM   Destiny Shaw 11-14-1950 [id number]  CC: Chief Complaint  Patient presents with   cath ua   HPI: Destiny Shaw is a 72 y.o. 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  She is accompanied today by her husband and son, who contribute to HPI.  Today she reports Destiny Shaw is not working for her urinary incontinence.  She continues to experience a large volume urine leaks, especially overnight.  In retrospect, they think Myrbetriq did help, but was cost prohibitive.  Oxybutynin did not work either.  Repeat MRI brain without contrast dated 07/25/2022 redemonstrated her ventriculomegaly which remains concerning for NPH.  Her son reports that he has gotten a referral to see a new neurologist in [place], but the appointment has not been scheduled yet.  In-office catheterized UA and microscopy today pan negative.  Catheterized residual 280 mL.  PMH: Past Medical History:  Diagnosis Date   ADD (attention deficit disorder)    Anemia    Anginal pain (HCC)    pt states has occas chest pain relates to indigestion; pt uses rest to relieve [facility] LOV-    Anxiety    Arthritis    Concussion    Depression    Diabetes mellitus without complication (HCC)    type 2    Dizziness    Fall    GERD (gastroesophageal reflux disease)    Headache    History of urinary tract infection    Hyperlipidemia    Hypertension    IBS (irritable bowel syndrome)    Imbalance    Numbness    right leg    Numbness in both hands    comes and goes    Pneumonia    last episode approx 1 year ago    Sleep apnea    uses cpap not all the time    Wears glasses     Surgical History: Past Surgical History:  Procedure Laterality Date   BREAST CYST ASPIRATION Left 1980's   neg   BREAST CYST EXCISION Right  1980's   neg   BREAST LUMPECTOMY Right    BREAST SURGERY     CARPAL TUNNEL RELEASE     CESAREAN SECTION     times 2   COLONOSCOPY WITH PROPOFOL N/A 05/11/2015   Procedure: COLONOSCOPY WITH PROPOFOL;  Surgeon: Scot Jun, MD;  Location: [facility] ENDOSCOPY;  Service: Endoscopy;  Laterality: N/A;   DE QUERVAIN'S RELEASE Right    ESOPHAGOGASTRODUODENOSCOPY (EGD) WITH PROPOFOL N/A 05/11/2015   Procedure: ESOPHAGOGASTRODUODENOSCOPY (EGD) WITH PROPOFOL;  Surgeon: Scot Jun, MD;  Location: [facility] ENDOSCOPY;  Service: Endoscopy;  Laterality: N/A;   EYE SURGERY     laser surgery bilat    HERNIA REPAIR     KNEE ARTHROSCOPY     LUMBAR LAMINECTOMY/DECOMPRESSION MICRODISCECTOMY Bilateral 09/14/2015   Procedure: MICRO LUMBAR BILATERAL DECOMPRESSION L4 - L5;  Surgeon: Jene Every, MD;  Location: [facility] ORS;  Service: Orthopedics;  Laterality: Bilateral;   REDUCTION MAMMAPLASTY Bilateral 1980   RHINOPLASTY     TONSILLECTOMY     TOTAL KNEE ARTHROPLASTY Left 06/18/2016   Procedure: LEFT TOTAL KNEE ARTHROPLASTY;  Surgeon: Durene Romans, MD;  Location: [facility] ORS;  Service: Orthopedics;  Laterality: Left;  Adductor Block   TOTAL KNEE ARTHROPLASTY Right 02/11/2020   Procedure: TOTAL KNEE ARTHROPLASTY;  Surgeon: Durene Romans, MD;  Location: [facility];  Service: Orthopedics;  Laterality: Right;   TUBAL LIGATION     UVULOPALATOPHARYNGOPLASTY      Home Medications:  Allergies as of 08/28/2022       Reactions   Levaquin [levofloxacin] Other (See Comments)   unknown   Statins Other (See Comments)   Tessalon [benzonatate] Hives   Latex Rash        Medication List        Accurate as of August 28, 2022  1:04 PM. If you have any questions, ask your nurse or doctor.          STOP taking these medications    Gemtesa 75 MG Tabs Generic drug: Vibegron Stopped by: Carman Ching, PA-C       TAKE these medications    buPROPion 150 MG 12 hr tablet Commonly known as: WELLBUTRIN SR Take 150 mg by  mouth 2 (two) times daily.   busPIRone 10 MG tablet Commonly known as: BUSPAR Take 10 mg by mouth 2 (two) times daily.   celecoxib 200 MG capsule Commonly known as: CELEBREX Take 200 mg by mouth daily.   cholecalciferol 25 MCG (1000 UNIT) tablet Commonly known as: VITAMIN D3 Take 1,000 Units by mouth daily.   glipiZIDE 10 MG 24 hr tablet Commonly known as: GLUCOTROL XL Take 10 mg by mouth daily with breakfast.   HYDROcodone-acetaminophen 5-325 MG tablet Commonly known as: Norco Take 1-2 tablets by mouth every 6 (six) hours as needed for moderate pain or severe pain.   Melatonin 10 MG Tabs Take 10 mg by mouth at bedtime.   memantine 5 MG tablet Commonly known as: NAMENDA Take 5 mg by mouth 2 (two) times daily.   metFORMIN 500 MG tablet Commonly known as: GLUCOPHAGE Take 500 mg by mouth every evening.   metoprolol succinate 25 MG 24 hr tablet Commonly known as: TOPROL-XL Take 25 mg by mouth daily.   mirabegron ER 50 MG Tb24 tablet Commonly known as: MYRBETRIQ Take 1 tablet (50 mg total) by mouth daily. Started by: Carman Ching, PA-C   pravastatin 20 MG tablet Commonly known as: PRAVACHOL Take 20 mg by mouth every evening.        Allergies:  Allergies  Allergen Reactions   Levaquin [Levofloxacin] Other (See Comments)    unknown   Statins Other (See Comments)   Tessalon [Benzonatate] Hives   Latex Rash    Family History: No family history on file.  Social History:   reports that she has never smoked. She has never been exposed to tobacco smoke. She has never used smokeless tobacco. She reports that she does not drink alcohol and does not use drugs.  Physical Exam: BP 118/73   Pulse 68   Wt 160 lb (72.6 kg)   BMI 27.46 kg/m   Constitutional:  Alert and oriented, no acute distress, nontoxic appearing HEENT: [place], AT Cardiovascular: No clubbing, cyanosis, or edema Respiratory: Normal respiratory effort, no increased work of breathing Skin:  No rashes, bruises or suspicious lesions Neurologic: Grossly intact, no focal deficits, moving all 4 extremities Psychiatric: Normal mood and affect  Laboratory Data: Results for orders placed or performed in visit on 08/28/22  Microscopic Examination   Urine  Result Value Ref Range   WBC, UA 0-5 0 - 5 /hpf   RBC, Urine 0-2 0 - 2 /hpf   Epithelial Cells (non renal) 0-10 0 - 10 /hpf   Bacteria, UA Few None seen/Few  Urinalysis, Complete  Result Value Ref Range   Specific Gravity, UA 1.010 1.005 - 1.030   pH, UA 5.0 5.0 - 7.5   Color, UA Yellow Yellow   Appearance Ur Clear Clear   Leukocytes,UA Negative Negative   Protein,UA Negative Negative/Trace   Glucose, UA Negative Negative   Ketones, UA Negative Negative   RBC, UA Negative Negative   Bilirubin, UA Negative Negative   Urobilinogen, Ur 0.2 0.2 - 1.0 mg/dL   Nitrite, UA Negative Negative   Microscopic Examination See below:    Assessment & Plan:   1. Urinary incontinence, unspecified type We had a lengthy and frank conversation today.  I continue to think her symptoms are consistent with normal pressure hydrocephalus and agree with neurology follow-up.  We discussed that we can pursue urologic treatments for her urinary incontinence, however given her brain findings, the therapeutic benefit of these is unclear.  Myrbetriq has been approved for generic use since they were last on it.  Will prescribe it today in case it is cheaper.  We also discussed pursuing third line therapies including PTNS, intravesical Botox, and InterStim.  They would be interested in augmenting pharmacotherapy with PTNS.  They do not think she would tolerate CIC if she developed incomplete emptying on Botox and would like to defer surgery, which is reasonable.  We also discussed using higher absorbency products overnight and I gave him links to online resources for this.  We also discussed consideration of buying an at home PureWick system.  Lastly, we  discussed consideration of chronic indwelling Foley catheter for urinary management, but I was very honest with them that I do not think that this is a great option in case she develops bladder spasms or leakage around the catheter.  We also discussed increased risk for UTIs with an indwelling Foley.  They appropriately would like to avoid this. - Urinalysis, Complete - mirabegron ER (MYRBETRIQ) 50 MG TB24 tablet; Take 1 tablet (50 mg total) by mouth daily.  Dispense: 30 tablet; Refill: 11  Return for Call if you want to start PTNS + 3 month follow up with Dr. Sherron Monday.  Carman Ching, PA-C  [facility] [place] [address], [address] [place], [place] [address] [phone]

## 2022-08-28 NOTE — Patient Instructions (Addendum)
For absorbent products:  http://adkins.net/  HourlyRingtones.com.cy UGLive.com.cy PureWick for home use: http://ballard.biz/

## 2022-08-31 ENCOUNTER — Telehealth: Payer: Self-pay

## 2022-08-31 DIAGNOSIS — R32 Unspecified urinary incontinence: Secondary | ICD-10-CM

## 2022-08-31 MED ORDER — MIRABEGRON ER 50 MG PO TB24
50.0000 mg | ORAL_TABLET | Freq: Every day | ORAL | 11 refills | Status: AC
Start: 2022-08-31 — End: ?

## 2022-08-31 NOTE — Telephone Encounter (Signed)
Incoming call on triage line from pt's son who states that he and his mother are very anxious to start Myrbetriq, the medication was going to be $400 at [facility] however they have found a coupon that would make the medication $140 at [facility], advised son that I wold have RX sent there as Doreatha Martin is currently out of office, son agrees that they will pick up RX at [facility] for now and wait to hear other recommendations from [name] if any.

## 2022-10-03 NOTE — Telephone Encounter (Signed)
Spoke with Destiny Shaw and PTNS appointments made, [name] patient has a follow up with Dr Sherron Monday on 12/03/22 in the middle of doing PTNS and the note said to follow up and discuss if wants to start PTNS treatment. Does this need to be cancelled?

## 2022-10-03 NOTE — Telephone Encounter (Signed)
noted 

## 2022-10-16 ENCOUNTER — Ambulatory Visit (INDEPENDENT_AMBULATORY_CARE_PROVIDER_SITE_OTHER): Payer: Medicare Other | Admitting: Physician Assistant

## 2022-10-16 DIAGNOSIS — R32 Unspecified urinary incontinence: Secondary | ICD-10-CM | POA: Diagnosis not present

## 2022-10-16 NOTE — Patient Instructions (Signed)

## 2022-10-16 NOTE — Progress Notes (Signed)
PTNS  Session # 1  Health & Social Factors: no change Caffeine: 1 Alcohol: 0 Daytime voids #per day: 4 Night-time voids #per night: 1 Urgency: non Incontinence Episodes #per day: 2-3 Ankle used: right Treatment Setting: 10 Feeling/ Response: sensory Comments: Patient tolerated well. Consent signed.  Performed By: Carman Ching, PA-C   Follow Up: 1 week

## 2022-10-23 ENCOUNTER — Ambulatory Visit (INDEPENDENT_AMBULATORY_CARE_PROVIDER_SITE_OTHER): Payer: Medicare Other | Admitting: Physician Assistant

## 2022-10-23 DIAGNOSIS — R32 Unspecified urinary incontinence: Secondary | ICD-10-CM

## 2022-10-23 NOTE — Progress Notes (Signed)
PTNS  Session # 2  Health & Social Factors: No change Caffeine: <1 Alcohol: 0 Daytime voids #per day: 1-2 Night-time voids #per night: 0-1 Urgency: unknown Incontinence Episodes #per day: 1-3 Ankle used: right Treatment Setting: 4 Feeling/ Response: sensory Comments: Voiding diary is extremely challenging to interpret due to patient's dementia. For example, her reported incontinence episodes represent her number of daily wet diapers and it is unclear if she is voiding purposefully into these or if these represent true leaks. She is also struggling to describe and localize her sensory responses to therapy.  Performed By: Carman Ching, PA-C   Follow Up: 1 week for PTNS #3

## 2022-10-23 NOTE — Patient Instructions (Signed)

## 2022-10-30 ENCOUNTER — Ambulatory Visit (INDEPENDENT_AMBULATORY_CARE_PROVIDER_SITE_OTHER): Payer: Medicare Other | Admitting: Physician Assistant

## 2022-10-30 DIAGNOSIS — R32 Unspecified urinary incontinence: Secondary | ICD-10-CM | POA: Diagnosis not present

## 2022-10-30 NOTE — Patient Instructions (Signed)

## 2022-10-30 NOTE — Progress Notes (Signed)
PTNS  Session # 3  Health & Social Factors: no change Caffeine: 0-1 Alcohol: 0 Daytime voids #per day: 3-4 Night-time voids #per night: unclear, full overnight diapers Urgency: unclear, question if patient is aware of urge Incontinence Episodes #per day: unclear, wears diapers Ankle used: right Treatment Setting: 5 Feeling/ Response: sensory Comments: Voiding diary and needle placement remain extremely challenging due to patient's dementia and difficulty describing symptoms/sensations.  Performed By: Carman Ching, PA-C   Follow Up: 1 week for PTNS #4

## 2022-11-06 ENCOUNTER — Ambulatory Visit (INDEPENDENT_AMBULATORY_CARE_PROVIDER_SITE_OTHER): Payer: Medicare Other | Admitting: Physician Assistant

## 2022-11-06 DIAGNOSIS — R32 Unspecified urinary incontinence: Secondary | ICD-10-CM

## 2022-11-06 NOTE — Progress Notes (Signed)
PTNS  Session # 4  Health & Social Factors: no change Caffeine: 0 Alcohol: 0 Daytime voids #per day: 2-4 Night-time voids #per night: 0-1 Urgency: mild Incontinence Episodes #per day: 1-3 Ankle used: right Treatment Setting: 5 Feeling/ Response: sensory Comments: She has severe right hip pain; I think she's getting confused between that sensation and the sole of the foot tingling we seek with PTNS. This is also making positioning more challenging, and she accidentally bent/removed the first needle today due to this. I'm going to try left sided treatment next week to see if she tolerates it better.  Performed By: Carman Ching, PA-C   Follow Up: 1 week for PTNS #5

## 2022-11-13 ENCOUNTER — Ambulatory Visit (INDEPENDENT_AMBULATORY_CARE_PROVIDER_SITE_OTHER): Payer: Medicare Other | Admitting: Physician Assistant

## 2022-11-13 DIAGNOSIS — R32 Unspecified urinary incontinence: Secondary | ICD-10-CM | POA: Diagnosis not present

## 2022-11-13 NOTE — Progress Notes (Signed)
PTNS  Session # 5  Health & Social Factors: Patient fell twice on her coccyx and is having a lot of discomfort. Caffeine: 0 Alcohol: 0 Daytime voids #per day: 2-4 Night-time voids #per night: 0-1 Urgency: mild Incontinence Episodes #per day: 1-3 Ankle used: left Treatment Setting: 8 Feeling/ Response: sensory Comments: Patient tolerated well, however she remains in a deal of pelvic/hip pain that is very distracting. Combined with her dementia ascertaining if she's getting appropriate treatments remains very challenging.  Performed By: Carman Ching, PA-C   Follow Up: 1 week for PTNS #6

## 2022-11-13 NOTE — Patient Instructions (Signed)

## 2022-11-14 ENCOUNTER — Emergency Department (HOSPITAL_COMMUNITY)
Admission: EM | Admit: 2022-11-14 | Discharge: 2022-11-15 | Disposition: A | Discharge status: Home / Self Care | Payer: Medicare Other | Attending: Emergency Medicine | Admitting: Emergency Medicine

## 2022-11-14 ENCOUNTER — Encounter (HOSPITAL_COMMUNITY): Payer: Self-pay | Admitting: Emergency Medicine

## 2022-11-14 ENCOUNTER — Other Ambulatory Visit: Payer: Self-pay

## 2022-11-14 ENCOUNTER — Emergency Department (HOSPITAL_COMMUNITY): Payer: Medicare Other

## 2022-11-14 DIAGNOSIS — Z91040 Latex allergy status: Secondary | ICD-10-CM | POA: Insufficient documentation

## 2022-11-14 DIAGNOSIS — S01112A Laceration without foreign body of left eyelid and periocular area, initial encounter: Secondary | ICD-10-CM | POA: Insufficient documentation

## 2022-11-14 DIAGNOSIS — S0181XA Laceration without foreign body of other part of head, initial encounter: Secondary | ICD-10-CM | POA: Diagnosis not present

## 2022-11-14 DIAGNOSIS — W19XXXA Unspecified fall, initial encounter: Secondary | ICD-10-CM | POA: Insufficient documentation

## 2022-11-14 DIAGNOSIS — Z79899 Other long term (current) drug therapy: Secondary | ICD-10-CM | POA: Diagnosis not present

## 2022-11-14 DIAGNOSIS — R102 Pelvic and perineal pain: Secondary | ICD-10-CM | POA: Diagnosis not present

## 2022-11-14 DIAGNOSIS — F0390 Unspecified dementia without behavioral disturbance: Secondary | ICD-10-CM | POA: Insufficient documentation

## 2022-11-14 DIAGNOSIS — Z23 Encounter for immunization: Secondary | ICD-10-CM | POA: Insufficient documentation

## 2022-11-14 DIAGNOSIS — Z7982 Long term (current) use of aspirin: Secondary | ICD-10-CM | POA: Diagnosis not present

## 2022-11-14 DIAGNOSIS — Z7984 Long term (current) use of oral hypoglycemic drugs: Secondary | ICD-10-CM | POA: Insufficient documentation

## 2022-11-14 DIAGNOSIS — Y92009 Unspecified place in unspecified non-institutional (private) residence as the place of occurrence of the external cause: Secondary | ICD-10-CM | POA: Diagnosis not present

## 2022-11-14 DIAGNOSIS — S0993XA Unspecified injury of face, initial encounter: Secondary | ICD-10-CM | POA: Diagnosis present

## 2022-11-14 DIAGNOSIS — E119 Type 2 diabetes mellitus without complications: Secondary | ICD-10-CM | POA: Diagnosis not present

## 2022-11-14 DIAGNOSIS — M549 Dorsalgia, unspecified: Secondary | ICD-10-CM | POA: Diagnosis not present

## 2022-11-14 DIAGNOSIS — I10 Essential (primary) hypertension: Secondary | ICD-10-CM | POA: Insufficient documentation

## 2022-11-14 LAB — CBC WITH DIFFERENTIAL/PLATELET
Abs Immature Granulocytes: 0.02 10*3/uL (ref 0.00–0.07)
Basophils Absolute: 0 10*3/uL (ref 0.0–0.1)
Basophils Relative: 1 %
Eosinophils Absolute: 0.2 10*3/uL (ref 0.0–0.5)
Eosinophils Relative: 2 %
HCT: 35.8 % — ABNORMAL LOW (ref 36.0–46.0)
Hemoglobin: 11.5 g/dL — ABNORMAL LOW (ref 12.0–15.0)
Immature Granulocytes: 0 %
Lymphocytes Relative: 32 %
Lymphs Abs: 2 10*3/uL (ref 0.7–4.0)
MCH: 28.4 pg (ref 26.0–34.0)
MCHC: 32.1 g/dL (ref 30.0–36.0)
MCV: 88.4 fL (ref 80.0–100.0)
Monocytes Absolute: 0.4 10*3/uL (ref 0.1–1.0)
Monocytes Relative: 7 %
Neutro Abs: 3.6 10*3/uL (ref 1.7–7.7)
Neutrophils Relative %: 58 %
Platelets: 169 10*3/uL (ref 150–400)
RBC: 4.05 MIL/uL (ref 3.87–5.11)
RDW: 13 % (ref 11.5–15.5)
WBC: 6.3 10*3/uL (ref 4.0–10.5)
nRBC: 0 % (ref 0.0–0.2)

## 2022-11-14 LAB — I-STAT CHEM 8, ED
BUN: 16 mg/dL (ref 8–23)
Calcium, Ion: 1.19 mmol/L (ref 1.15–1.40)
Chloride: 107 mmol/L (ref 98–111)
Creatinine, Ser: 0.8 mg/dL (ref 0.44–1.00)
Glucose, Bld: 99 mg/dL (ref 70–99)
HCT: 33 % — ABNORMAL LOW (ref 36.0–46.0)
Hemoglobin: 11.2 g/dL — ABNORMAL LOW (ref 12.0–15.0)
Potassium: 4 mmol/L (ref 3.5–5.1)
Sodium: 142 mmol/L (ref 135–145)
TCO2: 21 mmol/L — ABNORMAL LOW (ref 22–32)

## 2022-11-14 MED ORDER — TETANUS-DIPHTH-ACELL PERTUSSIS 5-2.5-18.5 LF-MCG/0.5 IM SUSY
0.5000 mL | PREFILLED_SYRINGE | Freq: Once | INTRAMUSCULAR | Status: AC
  Administered 2022-11-14: 0.5 mL via INTRAMUSCULAR
  Filled 2022-11-14: qty 0.5

## 2022-11-14 MED ORDER — IOHEXOL 350 MG/ML SOLN
75.0000 mL | Freq: Once | INTRAVENOUS | Status: AC | PRN
  Administered 2022-11-14: 75 mL via INTRAVENOUS

## 2022-11-14 NOTE — Discharge Instructions (Addendum)
All your images today looked okay.  There was no evidence of any new broken bones or internal bleeding.  You are very constipated so it would be a good idea to start taking MiraLAX to see if that will help relieve the constipation.  You can do 1 capful in 6 ounces of water or other liquid every 6 hours until you have a bowel movement.  Glue was placed on your cut and when it is ready it will just peel off.  It is okay to shower and you can still wash your hair.  Just do not scrub directly on the glue or soak it.  Finally if you start having repetitive vomiting, inability to arouse you should return to the emergency room immediately.  Your tetanus shot was also updated today so would might cause some soreness to your arm tomorrow.

## 2022-11-14 NOTE — ED Provider Notes (Signed)
[place] EMERGENCY DEPARTMENT AT [facility] Provider Note   CSN: [id number] Arrival date & time: 11/14/22  1914     History  Chief Complaint  Patient presents with   Destiny Shaw    Destiny Shaw is a 72 y.o. female.  Patient is a 72 year old female with a history of hypertension, diabetes, dementia, hydrocephalus, multiple falls and requires a walker who is presenting today after another fall at home.  Her son gives the majority of the history and reports she was not using her walker and fell to her left side.  He was several rooms away but heard the fall and he reports she hit the side of the wall on her left side and a lamp as well when she was going down.  Patient was never unconscious and seems to be at her baseline but has complained of headache and ongoing pain in her back and left side.  They report she had an MRI done today because she had been having ongoing pelvic pain after several falls but this occurred after the MRI.  She has been able to move her legs she denies any neck pain.  No new medications.  She does not take any blood thinners.  Unknown last tetanus shot.  The history is provided by the patient, a caregiver and a relative.  Fall       Home Medications Prior to Admission medications   Medication Sig Start Date End Date Taking? Authorizing Provider  aspirin EC 81 MG tablet Take 81 mg by mouth daily. Swallow whole.   Yes [provider]  buPROPion (WELLBUTRIN XL) 300 MG 24 hr tablet Take 300 mg by mouth at bedtime. 08/30/22  Yes [provider]  busPIRone (BUSPAR) 10 MG tablet Take 10 mg by mouth daily.   Yes [provider]  celecoxib (CELEBREX) 200 MG capsule Take 200 mg by mouth daily.   Yes [provider]  cholecalciferol (VITAMIN D3) 25 MCG (1000 UNIT) tablet Take 1,000 Units by mouth daily.   Yes [provider]  cyanocobalamin (VITAMIN B12) 1000 MCG/ML injection Inject 1,000 mcg into the muscle every 30  (thirty) days.   Yes [provider]  HYDROcodone-acetaminophen (NORCO) 5-325 MG tablet Take 1-2 tablets by mouth every 6 (six) hours as needed for moderate pain or severe pain. 02/12/20  Yes Lanney Gins, PA-C  MAGNESIUM PO Take 1 tablet by mouth daily.   Yes [provider]  metFORMIN (GLUCOPHAGE) 500 MG tablet Take 500 mg by mouth with breakfast, with lunch, and with evening meal. 07/15/15  Yes [provider]  metoprolol succinate (TOPROL-XL) 25 MG 24 hr tablet Take 25 mg by mouth daily.   Yes [provider]  mirabegron ER (MYRBETRIQ) 50 MG TB24 tablet Take 1 tablet (50 mg total) by mouth daily. 08/31/22  Yes Vaillancourt, Samantha, PA-C  pravastatin (PRAVACHOL) 20 MG tablet Take 20 mg by mouth every evening.   Yes [provider]  senna (SENOKOT) 8.6 MG tablet Take 1 tablet by mouth daily as needed for constipation.   Yes [provider]      Allergies    Levaquin [levofloxacin], Statins, Tessalon [benzonatate], and Latex    Review of Systems   Review of Systems  Physical Exam Updated Vital Signs BP (!) 126/97   Pulse 66   Temp 97.9 F (36.6 C) (Oral)   Resp 14   Ht 5\' 4"  (1.626 m)   Wt 65.8 kg   SpO2 97%  BMI 24.89 kg/m  Physical Exam Vitals and nursing note reviewed.  Constitutional:      General: She is not in acute distress.    Appearance: She is well-developed.  HENT:     Head: Normocephalic.   Eyes:     Conjunctiva/sclera: Conjunctivae normal.     Pupils: Pupils are equal, round, and reactive to light.  Cardiovascular:     Rate and Rhythm: Normal rate and regular rhythm.     Heart sounds: No murmur heard. Pulmonary:     Effort: Pulmonary effort is normal. No respiratory distress.     Breath sounds: Normal breath sounds. No wheezing or rales.  Abdominal:     General: There is no distension.     Palpations: Abdomen is soft.     Tenderness: There is no abdominal tenderness. There is left CVA tenderness.  There is no guarding or rebound.  Musculoskeletal:        General: Tenderness present. Normal range of motion.     Left shoulder: Tenderness present. No bony tenderness. Normal range of motion.     Left elbow: Normal.     Left wrist: Normal.     Cervical back: Normal range of motion and neck supple. No spinous process tenderness or muscular tenderness.     Lumbar back: Tenderness and bony tenderness present.       Back:     Comments: Tenderness with palpation in the left deltoid.  Able to completely flex and extend the left hip and internally and externally rotate without significant signs of pain.  Pulses intact.  Normal flexion extension of bilateral knees and ankles  Skin:    General: Skin is warm and dry.     Findings: No erythema or rash.  Neurological:     Mental Status: She is alert and oriented to person, place, and time.  Psychiatric:        Behavior: Behavior normal.     ED Results / Procedures / Treatments   Labs (all labs ordered are listed, but only abnormal results are displayed) Labs Reviewed  CBC WITH DIFFERENTIAL/PLATELET - Abnormal; Notable for the following components:      Result Value   Hemoglobin 11.5 (*)    HCT 35.8 (*)    All other components within normal limits  I-STAT CHEM 8, ED - Abnormal; Notable for the following components:   TCO2 21 (*)    Hemoglobin 11.2 (*)    HCT 33.0 (*)    All other components within normal limits    EKG EKG Interpretation Date/Time:  Wednesday November 14 2022 19:28:05 EDT Ventricular Rate:  67 PR Interval:  153 QRS Duration:  102 QT Interval:  411 QTC Calculation: 434 R Axis:   6  Text Interpretation: Sinus rhythm No significant change since last tracing Confirmed by Gwyneth Sprout ([id number]) on 11/14/2022 8:03:51 PM  Radiology CT HEAD WO CONTRAST  Result Date: 11/14/2022 CLINICAL DATA:  Head trauma, moderate-severe; Facial trauma, blunt. Fall EXAM: CT HEAD WITHOUT CONTRAST CT MAXILLOFACIAL WITHOUT CONTRAST  TECHNIQUE: Multidetector CT imaging of the head and maxillofacial structures were performed using the standard protocol without intravenous contrast. Multiplanar CT image reconstructions of the maxillofacial structures were also generated. RADIATION DOSE REDUCTION: This exam was performed according to the departmental dose-optimization program which includes automated exposure control, adjustment of the mA and/or kV according to patient size and/or use of iterative reconstruction technique. COMPARISON:  MRI head 07/25/2022 FINDINGS: CT HEAD FINDINGS Brain: Stable prominence of the lateral ventricles  may be related to central predominant atrophy, although a component of normal pressure/communicating hydrocephalus cannot be excluded. Patchy and confluent areas of decreased attenuation are noted throughout the deep and periventricular white matter of the cerebral hemispheres bilaterally, compatible with chronic microvascular ischemic disease. no evidence of large-territorial acute infarction. No parenchymal hemorrhage. No mass lesion. No extra-axial collection. No mass effect or midline shift. No hydrocephalus. Basilar cisterns are patent. Vascular: No hyperdense vessel. Skull: No acute fracture or focal lesion. Bilateral temporomandibular joint degenerative changes. Other: None. CT MAXILLOFACIAL FINDINGS Osseous: No fracture or mandibular dislocation. No destructive process. Edentulous maxilla. Sinuses/Orbits: Paranasal sinuses and mastoid air cells are clear. The orbits are unremarkable. Soft tissues: Negative. IMPRESSION: 1. No acute intracranial abnormality. 2.  No acute displaced facial fracture. 3. Stable prominence of the lateral ventricles may be related to central predominant atrophy, although a component of normal pressure/communicating hydrocephalus cannot be excluded. Electronically Signed   By: Tish Frederickson M.D.   On: 11/14/2022 22:52   CT MAXILLOFACIAL WO CONTRAST  Result Date: 11/14/2022 CLINICAL  DATA:  Head trauma, moderate-severe; Facial trauma, blunt. Fall EXAM: CT HEAD WITHOUT CONTRAST CT MAXILLOFACIAL WITHOUT CONTRAST TECHNIQUE: Multidetector CT imaging of the head and maxillofacial structures were performed using the standard protocol without intravenous contrast. Multiplanar CT image reconstructions of the maxillofacial structures were also generated. RADIATION DOSE REDUCTION: This exam was performed according to the departmental dose-optimization program which includes automated exposure control, adjustment of the mA and/or kV according to patient size and/or use of iterative reconstruction technique. COMPARISON:  MRI head 07/25/2022 FINDINGS: CT HEAD FINDINGS Brain: Stable prominence of the lateral ventricles may be related to central predominant atrophy, although a component of normal pressure/communicating hydrocephalus cannot be excluded. Patchy and confluent areas of decreased attenuation are noted throughout the deep and periventricular white matter of the cerebral hemispheres bilaterally, compatible with chronic microvascular ischemic disease. no evidence of large-territorial acute infarction. No parenchymal hemorrhage. No mass lesion. No extra-axial collection. No mass effect or midline shift. No hydrocephalus. Basilar cisterns are patent. Vascular: No hyperdense vessel. Skull: No acute fracture or focal lesion. Bilateral temporomandibular joint degenerative changes. Other: None. CT MAXILLOFACIAL FINDINGS Osseous: No fracture or mandibular dislocation. No destructive process. Edentulous maxilla. Sinuses/Orbits: Paranasal sinuses and mastoid air cells are clear. The orbits are unremarkable. Soft tissues: Negative. IMPRESSION: 1. No acute intracranial abnormality. 2.  No acute displaced facial fracture. 3. Stable prominence of the lateral ventricles may be related to central predominant atrophy, although a component of normal pressure/communicating hydrocephalus cannot be excluded.  Electronically Signed   By: Tish Frederickson M.D.   On: 11/14/2022 22:52   DG Chest Port 1 View  Result Date: 11/14/2022 CLINICAL DATA:  Trauma EXAM: PORTABLE CHEST 1 VIEW COMPARISON:  None Available. FINDINGS: The heart and mediastinal contours are within normal limits. Aortic calcification. No focal consolidation. No pulmonary edema. No pleural effusion. No pneumothorax. No acute osseous abnormality. Degenerative changes of bilateral shoulders. IMPRESSION: 1. No active disease. 2.  Aortic Atherosclerosis (ICD10-I70.0). Electronically Signed   By: Tish Frederickson M.D.   On: 11/14/2022 22:11    Procedures Procedures   LACERATION REPAIR Performed by: [facility] Authorized by: Gwyneth Sprout Consent: Verbal consent obtained. Risks and benefits: risks, benefits and alternatives were discussed Consent given by: patient Patient identity confirmed: provided demographic data Prepped and Draped in normal sterile fashion Wound explored  Laceration Location: left eyebrow  Laceration Length: 0.5cm  No Foreign Bodies seen or palpated  Anesthesia: none Irrigation  method: scrub Amount of cleaning: standard  Skin closure: dermabond  Patient tolerance: Patient tolerated the procedure well with no immediate complications.  Medications Ordered in ED Medications  Tdap (BOOSTRIX) injection 0.5 mL (0.5 mLs Intramuscular Given 11/14/22 2126)  iohexol (OMNIPAQUE) 350 MG/ML injection 75 mL (75 mLs Intravenous Contrast Given 11/14/22 2245)    ED Course/ Medical Decision Making/ A&P                                 Medical Decision Making Amount and/or Complexity of Data Reviewed Independent Historian: caregiver and spouse External Data Reviewed: notes. Labs: ordered. Decision-making details documented in ED Course. Radiology: ordered and independent interpretation performed. Decision-making details documented in ED Course. ECG/medicine tests: ordered and independent interpretation  performed. Decision-making details documented in ED Course.  Risk Prescription drug management.   Pt with multiple medical problems and comorbidities and presenting today with a complaint that caries a high risk for morbidity and mortality.  Here today after another fall at home.  Multiple areas of pain and concern for injury.  Patient does not take anticoagulation.  Will need to eval head, face, abdomen and pelvis to ensure no acute injuries.  Tetanus shot was updated.  I independently interpreted patient's labs and EKG and renal function is normal, hemoglobin without acute changes.  I have independently visualized and interpreted pt's images today.  Chest x-ray within normal limits.  CT of the head without evidence of intracranial bleeding and facial CT without fracture.  Radiology reports no acute intracranial abnormalities or fractures and stable prominence of the lateral ventricles which has been seen in the past.  They also report on her CT abdomen pelvis there are no acute fractures and no fractures in her lumbar spine but does have some degenerative changes as well as significant constipation.  Discussed this with the patient and her family who are present at bedside.  Patient has only had 1 bowel movement in the last 2 weeks and they have been using Senokot.  They do have MiraLAX at home and recommended they start using that to help with constipation.  Wound was repaired with Dermabond.  At this time feel that patient is stable for discharge home.          Final Clinical Impression(s) / ED Diagnoses Final diagnoses:  Fall, initial encounter  Facial laceration, initial encounter    Rx / DC Orders ED Discharge Orders     None         Gwyneth Sprout, MD 11/14/22 2355

## 2022-11-14 NOTE — ED Triage Notes (Signed)
Patient BIB EMS for evaluation s/p fall.  Son at bedside reports fall was unwitnessed.  He heard a fall and went straight to patient.  Patient was alert.  No LOC noted.  No hx of blood thinners.  Having pain to L side of head, L hip, and L side.  Small laceration and bruise noted to L eye.  Patient has had multiple falls recently at home.

## 2022-11-20 ENCOUNTER — Ambulatory Visit (INDEPENDENT_AMBULATORY_CARE_PROVIDER_SITE_OTHER): Payer: Medicare Other | Admitting: Physician Assistant

## 2022-11-20 DIAGNOSIS — R32 Unspecified urinary incontinence: Secondary | ICD-10-CM | POA: Diagnosis not present

## 2022-11-20 NOTE — Progress Notes (Signed)
PTNS  Session # 6  Health & Social Factors: Fall this week, left black eye Caffeine: 0 Alcohol: 0 Daytime voids #per day: 4 Night-time voids #per night: 0 Urgency: unknown Incontinence Episodes #per day: unknown Ankle used: left Treatment Setting: 16 Feeling/ Response: both Comments: Dementia continues to complicate symptom assessment and needle placement.  Performed By: Carman Ching, PA-C   Follow Up: 1 week for PTNS #7

## 2022-11-27 ENCOUNTER — Ambulatory Visit (INDEPENDENT_AMBULATORY_CARE_PROVIDER_SITE_OTHER): Payer: Medicare Other | Admitting: Physician Assistant

## 2022-11-27 DIAGNOSIS — R32 Unspecified urinary incontinence: Secondary | ICD-10-CM

## 2022-11-27 NOTE — Progress Notes (Signed)
PTNS  Session # 7  Health & Social Factors: Fall this week Caffeine: 0-1 Alcohol: 0 Daytime voids #per day: at least 4 Night-time voids #per night: unknown, diaper is full Urgency: strong Incontinence Episodes #per day: unknown, mostly dry during day Ankle used: left Treatment Setting: 14 Feeling/ Response: sensory Comments: Patient tolerated well.  Performed By: Carman Ching, PA-C   Follow Up: 1 week for PTNS #8

## 2022-12-03 ENCOUNTER — Ambulatory Visit: Payer: Medicare Other | Admitting: Urology

## 2022-12-04 ENCOUNTER — Ambulatory Visit (INDEPENDENT_AMBULATORY_CARE_PROVIDER_SITE_OTHER): Payer: Medicare Other | Admitting: Physician Assistant

## 2022-12-04 DIAGNOSIS — R32 Unspecified urinary incontinence: Secondary | ICD-10-CM | POA: Diagnosis not present

## 2022-12-04 NOTE — Progress Notes (Signed)
PTNS  Session # 8  Health & Social Factors: No change Caffeine: <1 Alcohol: 0 Daytime voids #per day: 3-4 Night-time voids #per night: in Depends Urgency: strong Incontinence Episodes #per day: 0 Ankle used: left Treatment Setting: 8 Feeling/ Response: both Comments: Patient tolerated well  Performed By: Carman Ching, PA-C   Follow Up: 1 week for PTNS #9

## 2022-12-11 ENCOUNTER — Ambulatory Visit (INDEPENDENT_AMBULATORY_CARE_PROVIDER_SITE_OTHER): Payer: Medicare Other | Admitting: Physician Assistant

## 2022-12-11 DIAGNOSIS — R32 Unspecified urinary incontinence: Secondary | ICD-10-CM | POA: Diagnosis not present

## 2022-12-11 NOTE — Patient Instructions (Signed)

## 2022-12-11 NOTE — Progress Notes (Signed)
PTNS  Session # 9  Health & Social Factors: no change Caffeine: <1 Alcohol: 0 Daytime voids #per day: 4 Night-time voids #per night: unknown, diaper full in AM Urgency: ?strong Incontinence Episodes #per day: ? Just at night Ankle used: left Treatment Setting: 11 Feeling/ Response: sensory Comments: Patient tolerated well. Same dementia concerns as prior. Daughter reports maybe improved urgency.  Performed By: Carman Ching, PA-C   Follow Up: 1 week for PTNS #10

## 2022-12-18 ENCOUNTER — Ambulatory Visit (INDEPENDENT_AMBULATORY_CARE_PROVIDER_SITE_OTHER): Payer: Medicare Other | Admitting: Physician Assistant

## 2022-12-18 DIAGNOSIS — R32 Unspecified urinary incontinence: Secondary | ICD-10-CM

## 2022-12-18 NOTE — Progress Notes (Signed)
PTNS  Session # 10  Health & Social Factors: No change Caffeine: 1 Alcohol: 0 Daytime voids #per day: 4 Night-time voids #per night: 1 Urgency: Mild Incontinence Episodes #per day: Unknown Ankle used: Left Treatment Setting: 4 Feeling/ Response: sensory Comments: Patient tolerated well.  Performed By: Carman Ching, PA-C   Follow Up: 1 week

## 2022-12-18 NOTE — Patient Instructions (Signed)

## 2022-12-25 ENCOUNTER — Ambulatory Visit (INDEPENDENT_AMBULATORY_CARE_PROVIDER_SITE_OTHER): Payer: PRIVATE HEALTH INSURANCE | Admitting: Physician Assistant

## 2022-12-25 DIAGNOSIS — R32 Unspecified urinary incontinence: Secondary | ICD-10-CM | POA: Diagnosis not present

## 2022-12-25 NOTE — Progress Notes (Signed)
PTNS  Session # 11  Health & Social Factors: mp change Caffeine: 1 Alcohol: 0 Daytime voids #per day: 4-5 Night-time voids #per night: unknown Urgency: severe Incontinence Episodes #per day: rare daytime, frequent overnight Ankle used: left Treatment Setting: 8 Feeling/ Response: sensory Comments: Patient tolerated well.  Performed By: Carman Ching, PA-C   Follow Up: 1 week for PTNS #12

## 2023-01-01 ENCOUNTER — Ambulatory Visit: Payer: Medicare Other | Admitting: Physician Assistant

## 2023-01-29 ENCOUNTER — Ambulatory Visit: Payer: Medicare Other | Admitting: Physician Assistant

## 2023-07-28 DEATH — deceased
